# Patient Record
Sex: Female | Born: 1935 | Race: White | Hispanic: No | Marital: Married | Smoking: Never smoker
Health system: Southern US, Community
[De-identification: ages and names within clinical notes are randomized; demographics above are authoritative.]

## PROBLEM LIST (undated history)

## (undated) DIAGNOSIS — I495 Sick sinus syndrome: Secondary | ICD-10-CM

## (undated) DIAGNOSIS — R2681 Unsteadiness on feet: Secondary | ICD-10-CM

## (undated) DIAGNOSIS — G8929 Other chronic pain: Secondary | ICD-10-CM

## (undated) DIAGNOSIS — K5900 Constipation, unspecified: Secondary | ICD-10-CM

## (undated) DIAGNOSIS — I4891 Unspecified atrial fibrillation: Secondary | ICD-10-CM

## (undated) DIAGNOSIS — E785 Hyperlipidemia, unspecified: Secondary | ICD-10-CM

## (undated) DIAGNOSIS — I208 Other forms of angina pectoris: Secondary | ICD-10-CM

## (undated) DIAGNOSIS — G20 Parkinson's disease: Secondary | ICD-10-CM

## (undated) DIAGNOSIS — IMO0001 Reserved for inherently not codable concepts without codable children: Secondary | ICD-10-CM

## (undated) DIAGNOSIS — M549 Dorsalgia, unspecified: Secondary | ICD-10-CM

## (undated) DIAGNOSIS — E039 Hypothyroidism, unspecified: Secondary | ICD-10-CM

## (undated) DIAGNOSIS — G20A1 Parkinson's disease without dyskinesia, without mention of fluctuations: Secondary | ICD-10-CM

## (undated) DIAGNOSIS — R251 Tremor, unspecified: Secondary | ICD-10-CM

## (undated) DIAGNOSIS — Z950 Presence of cardiac pacemaker: Secondary | ICD-10-CM

## (undated) DIAGNOSIS — M1990 Unspecified osteoarthritis, unspecified site: Secondary | ICD-10-CM

## (undated) DIAGNOSIS — I351 Nonrheumatic aortic (valve) insufficiency: Secondary | ICD-10-CM

## (undated) HISTORY — PX: ABDOMINAL HYSTERECTOMY: SHX81

## (undated) HISTORY — DX: Sick sinus syndrome: I49.5

## (undated) HISTORY — PX: OTHER SURGICAL HISTORY: SHX169

## (undated) HISTORY — DX: Constipation, unspecified: K59.00

## (undated) HISTORY — DX: Unspecified atrial fibrillation: I48.91

## (undated) HISTORY — PX: INSERT / REPLACE / REMOVE PACEMAKER: SUR710

---

## 1999-01-25 HISTORY — PX: CARDIAC CATHETERIZATION: SHX172

## 1999-05-21 ENCOUNTER — Encounter: Admission: RE | Admit: 1999-05-21 | Discharge: 1999-05-21 | Payer: Self-pay | Admitting: Cardiology

## 1999-05-21 ENCOUNTER — Encounter: Payer: Self-pay | Admitting: Cardiology

## 1999-05-26 ENCOUNTER — Ambulatory Visit (HOSPITAL_COMMUNITY): Admission: RE | Admit: 1999-05-26 | Discharge: 1999-05-26 | Payer: Self-pay | Admitting: Rehabilitation

## 1999-05-26 HISTORY — PX: CARDIAC CATHETERIZATION: SHX172

## 2001-04-03 ENCOUNTER — Ambulatory Visit (HOSPITAL_COMMUNITY): Admission: RE | Admit: 2001-04-03 | Discharge: 2001-04-03 | Payer: Self-pay | Admitting: Obstetrics and Gynecology

## 2001-04-03 ENCOUNTER — Encounter: Payer: Self-pay | Admitting: Obstetrics and Gynecology

## 2001-05-28 ENCOUNTER — Other Ambulatory Visit: Admission: RE | Admit: 2001-05-28 | Discharge: 2001-05-28 | Payer: Self-pay | Admitting: Dermatology

## 2002-03-28 ENCOUNTER — Inpatient Hospital Stay (HOSPITAL_COMMUNITY): Admission: RE | Admit: 2002-03-28 | Discharge: 2002-03-29 | Payer: Self-pay | Admitting: Obstetrics & Gynecology

## 2002-06-26 ENCOUNTER — Other Ambulatory Visit: Admission: RE | Admit: 2002-06-26 | Discharge: 2002-06-26 | Payer: Self-pay | Admitting: Orthopaedic Surgery

## 2002-08-05 ENCOUNTER — Ambulatory Visit (HOSPITAL_COMMUNITY): Admission: RE | Admit: 2002-08-05 | Discharge: 2002-08-05 | Payer: Self-pay | Admitting: Internal Medicine

## 2004-02-26 ENCOUNTER — Ambulatory Visit: Payer: Self-pay | Admitting: Internal Medicine

## 2004-10-20 ENCOUNTER — Emergency Department (HOSPITAL_COMMUNITY): Admission: EM | Admit: 2004-10-20 | Discharge: 2004-10-20 | Payer: Self-pay | Admitting: Emergency Medicine

## 2006-02-01 ENCOUNTER — Ambulatory Visit: Payer: Self-pay | Admitting: Internal Medicine

## 2006-02-07 ENCOUNTER — Ambulatory Visit (HOSPITAL_COMMUNITY): Admission: RE | Admit: 2006-02-07 | Discharge: 2006-02-07 | Payer: Self-pay | Admitting: Internal Medicine

## 2008-08-10 ENCOUNTER — Emergency Department (HOSPITAL_COMMUNITY): Admission: EM | Admit: 2008-08-10 | Discharge: 2008-08-10 | Payer: Self-pay | Admitting: Emergency Medicine

## 2009-04-21 ENCOUNTER — Ambulatory Visit (HOSPITAL_COMMUNITY): Admission: RE | Admit: 2009-04-21 | Discharge: 2009-04-21 | Payer: Self-pay | Admitting: Family Medicine

## 2010-01-13 ENCOUNTER — Ambulatory Visit: Payer: Self-pay | Admitting: Internal Medicine

## 2010-02-14 ENCOUNTER — Encounter: Payer: Self-pay | Admitting: Obstetrics & Gynecology

## 2010-03-15 ENCOUNTER — Emergency Department (HOSPITAL_COMMUNITY)
Admission: EM | Admit: 2010-03-15 | Discharge: 2010-03-15 | Disposition: A | Discharge status: Home / Self Care | Payer: Medicare Other | Attending: Emergency Medicine | Admitting: Emergency Medicine

## 2010-03-15 DIAGNOSIS — E7800 Pure hypercholesterolemia, unspecified: Secondary | ICD-10-CM | POA: Insufficient documentation

## 2010-03-15 DIAGNOSIS — W2203XA Walked into furniture, initial encounter: Secondary | ICD-10-CM | POA: Insufficient documentation

## 2010-03-15 DIAGNOSIS — S61409A Unspecified open wound of unspecified hand, initial encounter: Secondary | ICD-10-CM | POA: Insufficient documentation

## 2010-03-15 DIAGNOSIS — Y92009 Unspecified place in unspecified non-institutional (private) residence as the place of occurrence of the external cause: Secondary | ICD-10-CM | POA: Insufficient documentation

## 2010-05-25 ENCOUNTER — Other Ambulatory Visit (HOSPITAL_COMMUNITY): Payer: Self-pay | Admitting: Family Medicine

## 2010-05-25 ENCOUNTER — Ambulatory Visit (HOSPITAL_COMMUNITY): Payer: Medicare Other

## 2010-05-25 DIAGNOSIS — G8929 Other chronic pain: Secondary | ICD-10-CM

## 2010-05-25 DIAGNOSIS — Z139 Encounter for screening, unspecified: Secondary | ICD-10-CM

## 2010-05-25 DIAGNOSIS — M545 Low back pain: Secondary | ICD-10-CM

## 2010-05-28 ENCOUNTER — Other Ambulatory Visit (HOSPITAL_COMMUNITY): Payer: Medicare Other

## 2010-05-28 ENCOUNTER — Ambulatory Visit (HOSPITAL_COMMUNITY): Payer: Medicare Other

## 2010-06-08 ENCOUNTER — Encounter (HOSPITAL_COMMUNITY): Payer: Self-pay

## 2010-06-08 ENCOUNTER — Ambulatory Visit (HOSPITAL_COMMUNITY)
Admission: RE | Admit: 2010-06-08 | Discharge: 2010-06-08 | Disposition: A | Discharge status: Home / Self Care | Payer: Medicare Other | Source: Ambulatory Visit | Attending: Family Medicine | Admitting: Family Medicine

## 2010-06-08 DIAGNOSIS — G8929 Other chronic pain: Secondary | ICD-10-CM

## 2010-06-08 DIAGNOSIS — Z1231 Encounter for screening mammogram for malignant neoplasm of breast: Secondary | ICD-10-CM | POA: Insufficient documentation

## 2010-06-08 DIAGNOSIS — Z139 Encounter for screening, unspecified: Secondary | ICD-10-CM

## 2010-06-08 DIAGNOSIS — M818 Other osteoporosis without current pathological fracture: Secondary | ICD-10-CM | POA: Insufficient documentation

## 2010-06-08 DIAGNOSIS — M545 Low back pain: Secondary | ICD-10-CM

## 2010-06-11 NOTE — Discharge Summary (Signed)
   NAME:  Abigail Taylor, Abigail Taylor                         ACCOUNT NO.:  [id number]   MEDICAL RECORD NO.:  [id number]                   PATIENT TYPE:  INP   LOCATION:  A308                                 FACILITY:  [facility]   PHYSICIAN:  Lazaro Arms, M.D.                DATE OF BIRTH:  [birth year]   DATE OF ADMISSION:  03/28/2002  DATE OF DISCHARGE:  03/29/2002                                 DISCHARGE SUMMARY   DISCHARGE DIAGNOSES:  1. Status post a vaginal vault suspension.  2. Status post an anterior colporrhaphy using Pelvicol Acellular Matrix     graft.  3. Enterocele repair.  4. Unremarkable postoperative course.   PROCEDURE:  Stated as above.   HISTORY OF PRESENT ILLNESS:  Please refer to the transcribed history and  physical and operative note for details of admission to the hospital.   HOSPITAL COURSE:  The patient was admitted postoperatively.  She had  problems with postoperative nausea and vomiting which were taken care of  with Zofran, Reglan, and Pepcid.  She does have a history of a small hiatal  hernia.  We took the packing and Foley catheter out on the morning of  postoperative day #1, and the patient did well.  She then tolerated clear  liquids and a regular diet.  She voided twice on postoperative day #1 prior  to discharge; once she had 375 mL of clear urine with 200 mL of residual,  and then she had 275 mL of clear urine and 200 mL of residual.  She had  minimal vaginal spotting.  She was in no pain, taking no pain medicine.  She  was discharged to home to come back to the office in three days to check a  postvoid residual.  She is discharged to home on Levaquin 500 a day, and she  is going to take Tylenol or Advil at home as needed for pain.  We will see  her in the office on Monday.                                               Lazaro Arms, M.D.    Loraine Maple  D:  03/29/2002  T:  03/30/2002  Job:  [id number]

## 2010-06-11 NOTE — Op Note (Signed)
NAME:  Abigail Taylor, Abigail Taylor                         ACCOUNT NO.:  [id number]   MEDICAL RECORD NO.:  [id number]                   PATIENT TYPE:  AMB   LOCATION:  DAY                                  FACILITY:  [facility]   PHYSICIAN:  Lazaro Arms, M.D.                DATE OF BIRTH:  [birth year]   DATE OF PROCEDURE:  03/28/2002  DATE OF DISCHARGE:                                 OPERATIVE REPORT   PREOPERATIVE DIAGNOSES:  1. Grade 3 cystocele with increasing pelvic pressure and pain.  2. Mild or grade 1 rectocele with mild constipation.   POSTOPERATIVE DIAGNOSES:  1. Grade 3 cystocele.  2. Enterocele.  3. Mild grade 1 rectocele.   PROCEDURE:  1. Interspinous vaginal vault suspension using the Bard Pelvicol Acellular     Collagen Matrix graft.  2. Anterior colporrhaphy using the Bard Pelvicol Acellular Collagen Matrix     graft.  3. Enterocele repair.   SURGEON:  Lazaro Arms, M.D.   ANESTHESIA:  General endotracheal.   FINDINGS:  The patient had essentially the same findings as in the office  but had found an enterocele at the time of surgery. Her rectocele was very  minor after the other repairs were done and because the patient's very  strong desired to have no disruption in sexual function at all. A posterior  colporrhaphy was not performed a this time.   DESCRIPTION OF PROCEDURE:  The patient was taken to the operating room,  placed in the supine position where she underwent general endotracheal  anesthesia. She was then placed in dorsal lithotomy position, prepped and  draped in the usual sterile fashion. A Foley catheter was placed for  continuous urine drainage. The vaginal apex was identified and grasped. A  0.5% Marcaine with 1:200,000 epinephrine was injected in the midline. A  sharp vertical incision was then made and the bladder was dissected off the  vaginal wall without difficulty. Hemostasis was achieved using the  electrocautery unit as well as interrupted  sutures for larger venous  bleeders. I was very pleased with the hemostasis we had achieved during the  case. During the dissection of the bladder off the vaginal wall, I  discovered an enterocele. The peritoneum was incised. The enterocele was  dissected back and a pursestring suture of anterior and posterior peritoneum  was performed as high as I could go and there was closure of the enterocele  sac. Dissection was then taken up to the symphysis pubis and taken back  posteriorly and laterally to the ischial spines. Then I got out the 8 x 12  Bard Pelvicol Acellular Matrix graft and barked it and marked it and cut it  according to previous models. Then I did a pleating stitch of the inferior  wing in order to take up some of the slack. I then passed an #0 Vicryl and a  nub through the inferior wing  and lateral wing of the graft and used a capio  self retaining needle driver and passed the suture through the fascia that  overlies the ischial spines bilaterally. These were then tied down  bilaterally with excellent strength and anatomical relationship. The  pubocervical fascia was then attached to the Pelvicol graft more anteriorly  and medially. Also attached in the midline underneath the bladder neck being  careful not to interrupt the urethra at all. I then performed an  interspinous vaginal vault suspension by going through the vagina and the  graft at two points and tying those down. I then trimmed back the excess  vagina and made sure all pedicles were hemostatic. The Pelvicol graft was  well secured in all six points and the vaginal apex was well secured. The  excess vaginal tissue was trimmed and then closed with interrupted sutures  and again attached at the bottom of the graft to ensure vaginal fixation. At  this point, there was a very mild amount to do posteriorly and the patient  has a very deep desire to maintain sexual function and I felt this could  compromise that and at  this time I did not do the posterior repair. That may  be something that will need to be done in the future but for now I did not  feel like it was appropriate.   The patient was awakened from anesthesia, all counts were correct x3. She  was taken to the recovery room in good stable condition. There a 100 mL  blood loss, she received 1900 mL of crystalloid and all tissue was sent to  pathology for evaluation.                                               Lazaro Arms, M.D.    Loraine Maple  D:  03/28/2002  T:  03/28/2002  Job:  [id number]

## 2010-06-11 NOTE — Cardiovascular Report (Signed)
[facility]  Patient:    Abigail Taylor, Abigail Taylor                        MRN: [id number] Proc. Date: 05/26/99 Adm. Date:  [id number] Disc. Date: [id number] Attending:  Ophelia Shoulder CC:         Madaline Savage, M.D.             Patrica Duel, M.D.             Cardiac Catheterization Laboratory                        Cardiac Catheterization  PROCEDURES PERFORMED: 1. Selective coronary angiography by Judkins technique. 2. Retrograde left heart catheterization. 3. Left ventricular angiography.  COMPLICATIONS:  None.  ENTRY SITE:  Right femoral.  DYE USED:  Omnipaque.  MEDICATIONS GIVEN:  None.  PATIENT PROFILE:  The patient is a 77 year old woman who enjoys yard work and has had recent chest discomfort while doing yard work that is relived by rest. A Persantine Cardiolite stress test performed May 10, 1999, showed suggestion of ischemia in the circumflex territory and a left ventricular ejection fraction of 80%.  Today she enters the cardiac catheterization lab on beta blockers and aspirin for a diagnostic outpatient cardiac catheterization.  RESULTS:  PRESSURES:  The left ventricular pressure was 125/26.  The central aortic pressure was 125/65, mean of 90.  No aortic valve gradient by pullback technique.  ANGIOGRAPHIC RESULTS:  The left main coronary artery was short and widely patent.  The left anterior descending artery coronary artery coursed to the cardiac apex and gave rise to two relatively small diagonal branches.  The LAD became tortuous in the distal half of the vessel.  No lesions were seen in the LAD or its diagonals.  The left circumflex coronary artery is a smooth normal-appearing vessel with three obtuse marginal branches, all of which are tortuous, all of which contain no lesions.  The circumflex itself appears normal as well including the ostium of the vessel.  The right coronary artery is a dominant vessel which  initially was prone to catheter-induced spasm by a 6 French Judkins catheter.  I did flush injections of the ostium, which continued to show some mild spasm so I then changed catheters to a 6 French side-hole guide catheter and infused nitroglycerin 200 mcg intracoronary on two different occasions and after doing so the entire vessel appeared larger as well as the segment proximally that showed the spasm.  I felt very comfortable that the spasm had completely resolved and that the vessel was indeed normal in multiple views including a lateral projection.  The left ventricular angiogram showed normal contractility with normal wall motion and an ejection fraction of 75% estimated.  No evidence of prolapse or mitral regurgitation.  There was no apical thrombus.  FINAL DIAGNOSES: 1. Angiographically patent coronary arteries of the right coronary dominant    system. 2. Normal left ventricular systolic function.  PLAN:  The patient is reassured with regard to her heart and coronary arteries and we will speak to the patients primary care physician in terms of a continued work-up of her chest discomfort. DD:  05/26/99 TD:  05/27/99 Job: [id number]

## 2010-06-11 NOTE — H&P (Signed)
NAME:  Abigail Taylor, Abigail Taylor                         ACCOUNT NO.:  [id number]   MEDICAL RECORD NO.:  [id number]                   PATIENT TYPE:  AMB   LOCATION:  DAY                                  FACILITY:  [facility]   PHYSICIAN:  Lazaro Arms, M.D.                DATE OF BIRTH:  February 19, 1931   DATE OF ADMISSION:  03/28/2002  DATE OF DISCHARGE:                                HISTORY & PHYSICAL   HISTORY OF PRESENT ILLNESS:  The patient is a 77 year old Abigail Taylor female,  gravida 3, para 3, status post a TAH in 1972, who has been struggling now  for a couple of years with increased pelvic pressure.  She is known to have  a pelvic prolapse with a cystocele.  She really does not have much complaint  of posterior pressure, but she is chronically constipated.  She is not  incontinent of urine.  On examination, she has a grade 2 to 3 cystocele and  a mild-to-moderate rectocele.  No enterocele was appreciated and again,  without the history of incontinence at all, she really does not need to have  a sling procedure performed.  She is admitted for an A&P repair with a  Pelvicol graft and a vaginal vault fixation.   PAST MEDICAL HISTORY:  Hypercholesterolemia and osteoarthritis.   PAST SURGICAL HISTORY:  She had a hysterectomy in 1972 abdominally and she  has had a heart catheterization about a year ago which was normal.  She had  chest pain which turned out to be gastroesophageal reflux disease.  She has  also had two plastic surgery procedures on her face.   PAST OBSTETRICAL HISTORY:  Vaginal deliveries.   MEDICINES:  Lipitor 20 mg a day.   REVIEW OF SYSTEMS:  Review of systems is significant for gastroesophageal  reflux occasionally.   SOCIAL HISTORY:  She is a retired lady and does not smoke.   PHYSICAL EXAMINATION:  VITAL SIGNS:  Her blood pressure is 120/80.  HEENT:  Unremarkable.  NECK:  Thyroid is normal.  LUNGS:  Lungs are clear.  HEART:  Heart is regular rate and rhythm without  murmur, regurgitation or  gallop.  BREASTS:  Breasts without mass, discharge or skin changes.  ABDOMEN:  Abdomen is benign.  No hepatosplenomegaly.  PELVIC:  She has normal external genitalia.  Vagina is normal without  discharge.  She does have a grade 2 to 3 cystocele and a mild rectocele.  No  enterocele was appreciated.  EXTREMITIES:  Her extremities are warm with no edema.  NEUROLOGIC:  Exam is grossly intact.   IMPRESSION:  1. Symptomatic pelvic relaxation without incontinence.  2. Constipation.   PLAN:  The patient is admitted for an A&P colporrhaphy with Pelvicol graft  being used as a matrix and a vaginal vault fixation.  She understands the  risks, benefits, indications and alternatives and will proceed.  Lazaro Arms, M.D.    Loraine Maple  D:  03/27/2002  T:  03/28/2002  Job:  [id number]

## 2010-06-11 NOTE — Op Note (Signed)
NAME:  Abigail Taylor, Abigail Taylor                         ACCOUNT NO.:  [id number]   MEDICAL RECORD NO.:  [id number]                   PATIENT TYPE:  AMB   LOCATION:  DAY                                  FACILITY:  [facility]   PHYSICIAN:  Lionel December, M.D.                 DATE OF BIRTH:  09-27-1931   DATE OF PROCEDURE:  08/05/2002  DATE OF DISCHARGE:                                 OPERATIVE REPORT   PROCEDURES PERFORMED:  Esophagogastroduodenoscopy followed by colonoscopy.   INDICATIONS FOR PROCEDURE:  Mikia is a 77 year old Caucasian female with  atypical chest pain radiating to back, face and mouth.  She has had normal  cardiac catheterization previously.  Upper GI series showed a small sliding  hiatal hernia.  She is on over-the-counter meds on a p.r.n. basis.  She is  undergoing diagnostic EGD.  She will also undergo a colonoscopy for  screening purposes.  Last colonoscopy was in March 1999.  Family history is  significant for colon carcinoma in a mother at age 25 and she is now 65  years old.  The procedure was reviewed with the patient and informed consent  was obtained.   PREOP MEDICATIONS:  Cetacaine spray for pharyngeal topical anesthesia,  Demerol 25 mg IV, Versed 4 mg IV.   DESCRIPTION OF PROCEDURE:  The procedure was performed in the endoscopy  suite.  The patient's vital signs and oxygen saturations were monitored  through the procedure and remained stable.   Procedure #1 - Esophagogastroduodenoscopy.  The patient was placed in the  left lateral position and the Olympus video scope was passed per oropharynx  without difficulty into the esophagus.   Esophagus:  The mucosa of the esophagus was normal except she had erosion at  GE junction and a swollen gastric mucosa at the Z-line.  A small sliding  hiatal hernia.   Stomach:  It was empty and distended very well with insufflation.  Folds of  the proximal stomach were normal.  Examination of mucosa at gastric body,  antrum,  pyloric channel as well as angularis, fundus and cardia was normal.   Duodenum:  Examination of the bulb and postbulbar duodenum was normal.   The endoscope was withdrawn and the patient prepared for procedure #2.   Procedure #2 - Colonoscopy.  Rectal examination performed.  She had a soft  sentinel skin tag.  Digital exam was normal.  The scope was placed in the  rectum and advanced in the region into the sigmoid colon beyond.  Preparation was excellent.  Scattered diverticula were noted at sigmoid and  descending colon, some of which were quite large.  Scope was passed to the  cecum which was identified by ileocecal valve and appendiceal stumps.  Pictures taken for the record.  As the scope was withdrawn, colonic was once  again carefully examined.  There were no polyps or tumor masses.  Perirectal  mucosa  was normal.  The scope was retroflexed to examine anorectal junction  and hemorrhoids were noted below the dentate line.  The endoscope was  straightened and withdrawn.  The patient tolerated the procedure well.   FINAL DIAGNOSES:  1. Erosive reflux esophagitis with a small hiatal hernia.  Erosions limited     to gastroesophageal junction.  2. Normal exam of the stomach plus second part of the duodenum.  3. Left colonic diverticulosis and internal hemorrhoids, otherwise normal     colonoscopy.   RECOMMENDATIONS:  She will continue antireflux measures.  I would like for  her to take Prilosec 20 mg every morning for at least a month and see if she  gets a better relief in which case she can stay on chronically.  High fiber  diet.  Citrucel equivalent of 1 tablespoonful daily.  She should consider  having another colonoscopy in five years from now.                                               Lionel December, M.D.    NR/MEDQ  D:  08/05/2002  T:  08/05/2002  Job:  [id number]   cc:   Patrica Duel, M.D.  [address], [address]  [place]  [place] [address]  Fax: [phone]

## 2011-01-25 DIAGNOSIS — IMO0001 Reserved for inherently not codable concepts without codable children: Secondary | ICD-10-CM

## 2011-01-25 HISTORY — DX: Reserved for inherently not codable concepts without codable children: IMO0001

## 2011-02-24 DIAGNOSIS — H52229 Regular astigmatism, unspecified eye: Secondary | ICD-10-CM | POA: Diagnosis not present

## 2011-02-24 DIAGNOSIS — H5200 Hypermetropia, unspecified eye: Secondary | ICD-10-CM | POA: Diagnosis not present

## 2011-02-24 DIAGNOSIS — Z961 Presence of intraocular lens: Secondary | ICD-10-CM | POA: Diagnosis not present

## 2011-02-24 DIAGNOSIS — H04129 Dry eye syndrome of unspecified lacrimal gland: Secondary | ICD-10-CM | POA: Diagnosis not present

## 2011-03-03 DIAGNOSIS — L578 Other skin changes due to chronic exposure to nonionizing radiation: Secondary | ICD-10-CM | POA: Diagnosis not present

## 2011-03-03 DIAGNOSIS — D235 Other benign neoplasm of skin of trunk: Secondary | ICD-10-CM | POA: Diagnosis not present

## 2011-03-29 DIAGNOSIS — IMO0002 Reserved for concepts with insufficient information to code with codable children: Secondary | ICD-10-CM | POA: Diagnosis not present

## 2011-03-29 DIAGNOSIS — N390 Urinary tract infection, site not specified: Secondary | ICD-10-CM | POA: Diagnosis not present

## 2011-03-29 DIAGNOSIS — M549 Dorsalgia, unspecified: Secondary | ICD-10-CM | POA: Diagnosis not present

## 2011-04-25 DIAGNOSIS — J9801 Acute bronchospasm: Secondary | ICD-10-CM | POA: Diagnosis not present

## 2011-04-25 DIAGNOSIS — J209 Acute bronchitis, unspecified: Secondary | ICD-10-CM | POA: Diagnosis not present

## 2011-04-25 DIAGNOSIS — IMO0002 Reserved for concepts with insufficient information to code with codable children: Secondary | ICD-10-CM | POA: Diagnosis not present

## 2011-04-25 DIAGNOSIS — K219 Gastro-esophageal reflux disease without esophagitis: Secondary | ICD-10-CM | POA: Diagnosis not present

## 2011-04-25 DIAGNOSIS — E785 Hyperlipidemia, unspecified: Secondary | ICD-10-CM | POA: Diagnosis not present

## 2011-05-06 ENCOUNTER — Other Ambulatory Visit (HOSPITAL_COMMUNITY): Payer: Self-pay | Admitting: Physician Assistant

## 2011-05-06 ENCOUNTER — Ambulatory Visit (HOSPITAL_COMMUNITY)
Admission: RE | Admit: 2011-05-06 | Discharge: 2011-05-06 | Disposition: A | Discharge status: Home / Self Care | Payer: Medicare Other | Source: Ambulatory Visit | Attending: Physician Assistant | Admitting: Physician Assistant

## 2011-05-06 DIAGNOSIS — R059 Cough, unspecified: Secondary | ICD-10-CM | POA: Diagnosis not present

## 2011-05-06 DIAGNOSIS — R05 Cough: Secondary | ICD-10-CM

## 2011-05-06 DIAGNOSIS — J984 Other disorders of lung: Secondary | ICD-10-CM | POA: Diagnosis not present

## 2011-05-06 DIAGNOSIS — IMO0002 Reserved for concepts with insufficient information to code with codable children: Secondary | ICD-10-CM | POA: Diagnosis not present

## 2011-07-04 ENCOUNTER — Encounter (HOSPITAL_COMMUNITY): Payer: Self-pay | Admitting: *Deleted

## 2011-07-04 ENCOUNTER — Inpatient Hospital Stay (HOSPITAL_COMMUNITY)
Admission: EM | Admit: 2011-07-04 | Discharge: 2011-07-06 | DRG: 244 | Disposition: A | Discharge status: Home / Self Care | Payer: Medicare Other | Attending: Cardiovascular Disease | Admitting: Cardiovascular Disease

## 2011-07-04 ENCOUNTER — Other Ambulatory Visit: Payer: Self-pay

## 2011-07-04 ENCOUNTER — Emergency Department (HOSPITAL_COMMUNITY): Payer: Medicare Other

## 2011-07-04 DIAGNOSIS — I4891 Unspecified atrial fibrillation: Principal | ICD-10-CM | POA: Diagnosis present

## 2011-07-04 DIAGNOSIS — I455 Other specified heart block: Secondary | ICD-10-CM | POA: Diagnosis not present

## 2011-07-04 DIAGNOSIS — J9819 Other pulmonary collapse: Secondary | ICD-10-CM | POA: Diagnosis not present

## 2011-07-04 DIAGNOSIS — I495 Sick sinus syndrome: Secondary | ICD-10-CM | POA: Diagnosis present

## 2011-07-04 DIAGNOSIS — I209 Angina pectoris, unspecified: Secondary | ICD-10-CM | POA: Diagnosis present

## 2011-07-04 DIAGNOSIS — E039 Hypothyroidism, unspecified: Secondary | ICD-10-CM | POA: Diagnosis present

## 2011-07-04 DIAGNOSIS — I10 Essential (primary) hypertension: Secondary | ICD-10-CM | POA: Diagnosis present

## 2011-07-04 DIAGNOSIS — R079 Chest pain, unspecified: Secondary | ICD-10-CM | POA: Diagnosis not present

## 2011-07-04 DIAGNOSIS — E669 Obesity, unspecified: Secondary | ICD-10-CM | POA: Diagnosis present

## 2011-07-04 DIAGNOSIS — E785 Hyperlipidemia, unspecified: Secondary | ICD-10-CM | POA: Diagnosis present

## 2011-07-04 DIAGNOSIS — I4901 Ventricular fibrillation: Secondary | ICD-10-CM | POA: Diagnosis not present

## 2011-07-04 DIAGNOSIS — E7800 Pure hypercholesterolemia, unspecified: Secondary | ICD-10-CM | POA: Diagnosis present

## 2011-07-04 DIAGNOSIS — I208 Other forms of angina pectoris: Secondary | ICD-10-CM | POA: Diagnosis present

## 2011-07-04 DIAGNOSIS — I480 Paroxysmal atrial fibrillation: Secondary | ICD-10-CM | POA: Diagnosis present

## 2011-07-04 HISTORY — DX: Hyperlipidemia, unspecified: E78.5

## 2011-07-04 HISTORY — DX: Other forms of angina pectoris: I20.8

## 2011-07-04 HISTORY — DX: Hypothyroidism, unspecified: E03.9

## 2011-07-04 HISTORY — DX: Nonrheumatic aortic (valve) insufficiency: I35.1

## 2011-07-04 LAB — CBC
HCT: 41.9 % (ref 36.0–46.0)
Hemoglobin: 14.3 g/dL (ref 12.0–15.0)
MCH: 29.1 pg (ref 26.0–34.0)
MCHC: 34.1 g/dL (ref 30.0–36.0)

## 2011-07-04 LAB — POCT I-STAT, CHEM 8
BUN: 23 mg/dL (ref 6–23)
Calcium, Ion: 1.22 mmol/L (ref 1.12–1.32)
Chloride: 108 mEq/L (ref 96–112)
Glucose, Bld: 161 mg/dL — ABNORMAL HIGH (ref 70–99)

## 2011-07-04 LAB — BASIC METABOLIC PANEL
BUN: 23 mg/dL (ref 6–23)
Calcium: 9.6 mg/dL (ref 8.4–10.5)
Creatinine, Ser: 0.92 mg/dL (ref 0.50–1.10)
GFR calc Af Amer: 67 mL/min — ABNORMAL LOW (ref >90)
GFR calc non Af Amer: 58 mL/min — ABNORMAL LOW (ref >90)

## 2011-07-04 LAB — POCT I-STAT TROPONIN I: Troponin i, poc: 0 ng/mL (ref 0.00–0.08)

## 2011-07-04 LAB — DIFFERENTIAL
Basophils Relative: 1 % (ref 0–1)
Eosinophils Absolute: 0.2 10*3/uL (ref 0.0–0.7)
Eosinophils Relative: 2 % (ref 0–5)
Monocytes Absolute: 0.8 10*3/uL (ref 0.1–1.0)
Monocytes Relative: 9 % (ref 3–12)

## 2011-07-04 LAB — APTT: aPTT: 26 seconds (ref 24–37)

## 2011-07-04 MED ORDER — DEXTROSE 5 % IV SOLN
5.0000 mg/h | Freq: Once | INTRAVENOUS | Status: AC
  Administered 2011-07-04: 10 mg/h via INTRAVENOUS
  Administered 2011-07-04: 20 mg/h via INTRAVENOUS

## 2011-07-04 MED ORDER — ASPIRIN 81 MG PO CHEW
324.0000 mg | CHEWABLE_TABLET | Freq: Once | ORAL | Status: AC
  Administered 2011-07-04: 324 mg via ORAL
  Filled 2011-07-04: qty 4

## 2011-07-04 NOTE — ED Notes (Signed)
Dr Preston Fleeting informed of bp cardizem stopped and fluid bolus started. Pt states cp resolved.

## 2011-07-04 NOTE — ED Provider Notes (Signed)
History     CSN: [id number]  Arrival date & time 07/04/11  2059   First MD Initiated Contact with Patient 07/04/11 2121      Chief Complaint  Patient presents with  . Chest Pain    (Consider location/radiation/quality/duration/timing/severity/associated sxs/prior treatment) Patient is a 78 y.o. female presenting with chest pain. The history is provided by the patient.  Chest Pain   She was chasing a neighbor dog with a broom and walking briskly when she developed a heavy, tight feeling in her lower chest. Pain was moderate and she rates it at 5/10. This is associated with dyspnea and palpitations. This occurred at about 1800 hours. There was no nausea, vomiting, diaphoresis. She's never had any symptoms like this before. She does see a cardiologist with [facility] but states that she had a negative heart catheterization does not have any heart disease that she knows of. She describes her symptoms as moderate to severe. Nothing makes it better nothing makes it worse.  History reviewed. No pertinent past medical history.  History reviewed. No pertinent past surgical history.  No family history on file.  History  Substance Use Topics  . Smoking status: Never Smoker   . Smokeless tobacco: Not on file  . Alcohol Use: No    OB History    Grav Para Term Preterm Abortions TAB SAB Ect Mult Living                  Review of Systems  Cardiovascular: Positive for chest pain.  All other systems reviewed and are negative.    Allergies  Review of patient's allergies indicates no known allergies.  Home Medications  No current outpatient prescriptions on file.  BP 119/78  Pulse 157  Temp 97.4 F (36.3 C)  Resp 20  SpO2 98%  Physical Exam  Nursing note and vitals reviewed.  78 year old female who is resting comfortably and in no acute distress. Vital signs are significant for tachycardia with heart rate 157. Oxygen saturation is 96% which is normal.  Head is normocephalic and atraumatic. PERRLA, EOMI. Oropharynx is clear. Neck is nontender and supple without adenopathy or JVD. Back is nontender. Lungs are clear without rales, wheezes, rhonchi. Heart is tachycardic and irregular without murmur. Abdomen is soft, flat, nontender without masses or hepatosplenomegaly. Extremities have full range of motion, no cyanosis or edema. Skin is warm and dry without rash. Neurologic: Mental status is normal, cranial nerves are intact, there no focal motor or sensory deficits.  ED Course  Procedures (including critical care time)  Results for orders placed during the hospital encounter of 07/04/11  CBC      Component Value Range   WBC 8.9  4.0 - 10.5 (K/uL)   RBC 4.91  3.87 - 5.11 (MIL/uL)   Hemoglobin 14.3  12.0 - 15.0 (g/dL)   HCT 40.9  81.1 - 91.4 (%)   MCV 85.3  78.0 - 100.0 (fL)   MCH 29.1  26.0 - 34.0 (pg)   MCHC 34.1  30.0 - 36.0 (g/dL)   RDW 78.2  95.6 - 21.3 (%)   Platelets 213  150 - 400 (K/uL)  DIFFERENTIAL      Component Value Range   Neutrophils Relative 67  43 - 77 (%)   Neutro Abs 5.9  1.7 - 7.7 (K/uL)   Lymphocytes Relative 22  12 - 46 (%)   Lymphs Abs 1.9  0.7 - 4.0 (K/uL)   Monocytes Relative 9  3 - 12 (%)  Monocytes Absolute 0.8  0.1 - 1.0 (K/uL)   Eosinophils Relative 2  0 - 5 (%)   Eosinophils Absolute 0.2  0.0 - 0.7 (K/uL)   Basophils Relative 1  0 - 1 (%)   Basophils Absolute 0.0  0.0 - 0.1 (K/uL)  BASIC METABOLIC PANEL      Component Value Range   Sodium 140  135 - 145 (mEq/L)   Potassium 3.9  3.5 - 5.1 (mEq/L)   Chloride 104  96 - 112 (mEq/L)   CO2 22  19 - 32 (mEq/L)   Glucose, Bld 163 (*) 70 - 99 (mg/dL)   BUN 23  6 - 23 (mg/dL)   Creatinine, Ser 9.56  0.50 - 1.10 (mg/dL)   Calcium 9.6  8.4 - 21.3 (mg/dL)   GFR calc non Af Amer 58 (*) >90 (mL/min)   GFR calc Af Amer 67 (*) >90 (mL/min)  TROPONIN I      Component Value Range   Troponin I <0.30  <0.30 (ng/mL)  PROTIME-INR      Component Value Range    Prothrombin Time 12.8  11.6 - 15.2 (seconds)   INR 0.94  0.00 - 1.49   APTT      Component Value Range   aPTT 26  24 - 37 (seconds)  POCT I-STAT, CHEM 8      Component Value Range   Sodium 143  135 - 145 (mEq/L)   Potassium 3.9  3.5 - 5.1 (mEq/L)   Chloride 108  96 - 112 (mEq/L)   BUN 23  6 - 23 (mg/dL)   Creatinine, Ser 0.86  0.50 - 1.10 (mg/dL)   Glucose, Bld 578 (*) 70 - 99 (mg/dL)   Calcium, Ion 4.69  6.29 - 1.32 (mmol/L)   TCO2 22  0 - 100 (mmol/L)   Hemoglobin 14.6  12.0 - 15.0 (g/dL)   HCT 52.8  41.3 - 24.4 (%)  POCT I-STAT TROPONIN I      Component Value Range   Troponin i, poc 0.00  0.00 - 0.08 (ng/mL)   Comment 3            Dg Chest Portable 1 View  07/04/2011  *RADIOLOGY REPORT*  Clinical Data: Chest pain.  PORTABLE CHEST - 1 VIEW  Comparison: 05/06/2011  Findings: Midline trachea.  Normal heart size and mediastinal contours for age.  No definite pleural fluid.  Biapical pleural thickening.  Bibasilar mild subsegmental atelectasis.  Lungs otherwise clear.  IMPRESSION: No acute cardiopulmonary disease.  Original Report Authenticated By: Consuello Bossier, M.D.    Date: 07/05/2011 2104  Rate: 147  Rhythm: atrial fibrillation  QRS Axis: normal  Intervals: normal  ST/T Wave abnormalities: nonspecific ST/T changes  Conduction Disutrbances:right bundle branch block  Narrative Interpretation: Atrial fibrillation with rapid ventricular response, right bundle branch block, nonspecific ST/T changes which are probably rate related. No old ECG available for comparison.  Old EKG Reviewed: none available   Date: 07/05/2011 2315  Rate: 70  Rhythm: normal sinus rhythm  QRS Axis: normal  Intervals: normal  ST/T Wave abnormalities: normal  Conduction Disutrbances:right bundle branch block  Narrative Interpretation: Right bundle branch block. When compared with ECG from earlier today, sinus rhythm has replaced atrial fibrillation with rapid ventricular response, nonspecific ST/T  changes have resolved indicating that they were rate related.  Old EKG Reviewed: changes noted     1. Atrial fibrillation with rapid ventricular response   2. Sinus arrest    CRITICAL CARE Performed by:  Mckenna Boruff   Total critical care time: 40 minutes  Critical care time was exclusive of separately billable procedures and treating other patients.  Critical care was necessary to treat or prevent imminent or life-threatening deterioration.  Critical care was time spent personally by me on the following activities: development of treatment plan with patient and/or surrogate as well as nursing, discussions with consultants, evaluation of patient's response to treatment, examination of patient, obtaining history from patient or surrogate, ordering and performing treatments and interventions, ordering and review of laboratory studies, ordering and review of radiographic studies, pulse oximetry and re-evaluation of patient's condition.    MDM  New onset atrial fibrillation with rapid ventricular response. I have reviewed her records and do not find a report of a cardiac catheterization. I do not feel comfortable enough that she does not have cardiac disease to try single dose of oral flecainide. She will be given diltiazem intravenously for rate control and cardiac markers will be checked.  After diltiazem bolus, heart rate came down to 90 but she became hypotensive with blood pressure of 80. She was given an IV fluid bolus and diltiazem was stopped. She was observed in the emergency department and had a 7.5 second period of sinus arrest and following that, she converted to normal sinus rhythm. She remained in normal sinus rhythm following that. Case was discussed with Dr. Tresa Endo who is on call for [facility] to felt that given her long sinus pause, she should be observed in the hospital overnight on telemetry. Case was discussed with Dr. Conley Rolls of [facility] who agrees  to admit the patient.      Dione Booze, MD 07/05/11 0100

## 2011-07-04 NOTE — ED Notes (Signed)
Pt states walking the dog and began to have chest pain. HR 140-160 monitor intact Dr Preston Fleeting at bedside

## 2011-07-04 NOTE — ED Notes (Signed)
Pt c/o CP and sob. "felt like heart was out of rhythm". Denies radiation or other sx. "Sudden onset about 1 hr ago after chasing a dog out of her front yard with a broom, but she did not run". Reports no PMHx, "but has a card MD that replaced Dr. Elsie Lincoln (does not remember name)". Pt alert, NAD, calm, interactive, skin W&D, resps e/u, speaking in clear complete sentences.

## 2011-07-04 NOTE — ED Notes (Signed)
Pt with a run of asystole pt states she felt like she was going to faint but was able to continue to talk on the phone. Repeat EKG shows pt has converted to sinus. Dr. Preston Fleeting  Informed.

## 2011-07-05 ENCOUNTER — Encounter (HOSPITAL_COMMUNITY): Payer: Self-pay | Admitting: *Deleted

## 2011-07-05 ENCOUNTER — Encounter (HOSPITAL_COMMUNITY)
Admission: EM | Disposition: A | Discharge status: Home / Self Care | Payer: Self-pay | Source: Home / Self Care | Attending: Cardiovascular Disease

## 2011-07-05 DIAGNOSIS — I4891 Unspecified atrial fibrillation: Secondary | ICD-10-CM | POA: Diagnosis not present

## 2011-07-05 DIAGNOSIS — E7800 Pure hypercholesterolemia, unspecified: Secondary | ICD-10-CM | POA: Diagnosis present

## 2011-07-05 DIAGNOSIS — I351 Nonrheumatic aortic (valve) insufficiency: Secondary | ICD-10-CM

## 2011-07-05 DIAGNOSIS — E039 Hypothyroidism, unspecified: Secondary | ICD-10-CM | POA: Diagnosis present

## 2011-07-05 DIAGNOSIS — I455 Other specified heart block: Secondary | ICD-10-CM

## 2011-07-05 DIAGNOSIS — I4901 Ventricular fibrillation: Secondary | ICD-10-CM | POA: Diagnosis not present

## 2011-07-05 DIAGNOSIS — R918 Other nonspecific abnormal finding of lung field: Secondary | ICD-10-CM | POA: Diagnosis not present

## 2011-07-05 DIAGNOSIS — J984 Other disorders of lung: Secondary | ICD-10-CM | POA: Diagnosis not present

## 2011-07-05 DIAGNOSIS — I10 Essential (primary) hypertension: Secondary | ICD-10-CM | POA: Diagnosis present

## 2011-07-05 DIAGNOSIS — I2089 Other forms of angina pectoris: Secondary | ICD-10-CM | POA: Diagnosis present

## 2011-07-05 DIAGNOSIS — E785 Hyperlipidemia, unspecified: Secondary | ICD-10-CM | POA: Diagnosis present

## 2011-07-05 DIAGNOSIS — I208 Other forms of angina pectoris: Secondary | ICD-10-CM

## 2011-07-05 DIAGNOSIS — I480 Paroxysmal atrial fibrillation: Secondary | ICD-10-CM | POA: Diagnosis present

## 2011-07-05 DIAGNOSIS — E669 Obesity, unspecified: Secondary | ICD-10-CM | POA: Diagnosis not present

## 2011-07-05 DIAGNOSIS — R079 Chest pain, unspecified: Secondary | ICD-10-CM | POA: Diagnosis not present

## 2011-07-05 DIAGNOSIS — I495 Sick sinus syndrome: Secondary | ICD-10-CM | POA: Diagnosis not present

## 2011-07-05 DIAGNOSIS — J9819 Other pulmonary collapse: Secondary | ICD-10-CM | POA: Diagnosis not present

## 2011-07-05 DIAGNOSIS — I209 Angina pectoris, unspecified: Secondary | ICD-10-CM | POA: Diagnosis not present

## 2011-07-05 HISTORY — PX: PERMANENT PACEMAKER INSERTION: SHX6023

## 2011-07-05 HISTORY — DX: Other forms of angina pectoris: I20.8

## 2011-07-05 HISTORY — DX: Nonrheumatic aortic (valve) insufficiency: I35.1

## 2011-07-05 HISTORY — PX: PERMANENT PACEMAKER INSERTION: SHX5480

## 2011-07-05 HISTORY — DX: Other forms of angina pectoris: I20.89

## 2011-07-05 LAB — CBC
Platelets: 209 10*3/uL (ref 150–400)
Platelets: 189 10*3/uL (ref 150–400)
RDW: 13.3 % (ref 11.5–15.5)
RDW: 13.2 % (ref 11.5–15.5)
WBC: 8.4 10*3/uL (ref 4.0–10.5)
WBC: 7.8 10*3/uL (ref 4.0–10.5)

## 2011-07-05 LAB — CREATININE, SERUM
Creatinine, Ser: 0.85 mg/dL (ref 0.50–1.10)
GFR calc Af Amer: 74 mL/min — ABNORMAL LOW (ref >90)
GFR calc non Af Amer: 64 mL/min — ABNORMAL LOW (ref >90)

## 2011-07-05 LAB — BASIC METABOLIC PANEL
Calcium: 8.8 mg/dL (ref 8.4–10.5)
Creatinine, Ser: 0.79 mg/dL (ref 0.50–1.10)
GFR calc Af Amer: >90 mL/min (ref >90)

## 2011-07-05 LAB — CARDIAC PANEL(CRET KIN+CKTOT+MB+TROPI)
CK, MB: 3.5 ng/mL (ref 0.3–4.0)
CK, MB: 4.1 ng/mL — ABNORMAL HIGH (ref 0.3–4.0)
Relative Index: INVALID (ref 0.0–2.5)
Relative Index: INVALID (ref 0.0–2.5)
Total CK: 75 U/L (ref 7–177)

## 2011-07-05 SURGERY — PERMANENT PACEMAKER INSERTION
Anesthesia: LOCAL

## 2011-07-05 MED ORDER — SODIUM CHLORIDE 0.9 % IR SOLN
80.0000 mg | Status: DC
  Filled 2011-07-05: qty 2

## 2011-07-05 MED ORDER — SODIUM CHLORIDE 0.9 % IJ SOLN: 3.0000 mL | INTRAMUSCULAR | Status: DC | PRN

## 2011-07-05 MED ORDER — PANTOPRAZOLE SODIUM 40 MG PO TBEC
40.0000 mg | DELAYED_RELEASE_TABLET | Freq: Every day | ORAL | Status: DC
  Administered 2011-07-05 – 2011-07-06 (×2): 40 mg via ORAL
  Filled 2011-07-05 (×2): qty 1

## 2011-07-05 MED ORDER — SODIUM CHLORIDE 0.9 % IV SOLN: INTRAVENOUS | Status: DC

## 2011-07-05 MED ORDER — FENTANYL CITRATE 0.05 MG/ML IJ SOLN
INTRAMUSCULAR | Status: AC
  Filled 2011-07-05: qty 2

## 2011-07-05 MED ORDER — SODIUM CHLORIDE 0.9 % IJ SOLN: 3.0000 mL | Freq: Two times a day (BID) | INTRAMUSCULAR | Status: DC

## 2011-07-05 MED ORDER — ONDANSETRON HCL 4 MG PO TABS: 4.0000 mg | ORAL_TABLET | Freq: Four times a day (QID) | ORAL | Status: DC | PRN

## 2011-07-05 MED ORDER — MIDAZOLAM HCL 2 MG/2ML IJ SOLN
INTRAMUSCULAR | Status: AC
  Filled 2011-07-05: qty 2

## 2011-07-05 MED ORDER — ONDANSETRON HCL 4 MG/2ML IJ SOLN: 4.0000 mg | Freq: Four times a day (QID) | INTRAMUSCULAR | Status: DC | PRN

## 2011-07-05 MED ORDER — CEFAZOLIN SODIUM 1-5 GM-% IV SOLN
1.0000 g | INTRAVENOUS | Status: DC
  Filled 2011-07-05: qty 50

## 2011-07-05 MED ORDER — ATORVASTATIN CALCIUM 20 MG PO TABS
20.0000 mg | ORAL_TABLET | Freq: Every day | ORAL | Status: DC
  Filled 2011-07-05 (×2): qty 1

## 2011-07-05 MED ORDER — SODIUM CHLORIDE 0.45 % IV SOLN: INTRAVENOUS | Status: DC

## 2011-07-05 MED ORDER — SODIUM CHLORIDE 0.9 % IV SOLN: 250.0000 mL | INTRAVENOUS | Status: DC

## 2011-07-05 MED ORDER — METOPROLOL TARTRATE 25 MG PO TABS
25.0000 mg | ORAL_TABLET | Freq: Two times a day (BID) | ORAL | Status: DC
  Administered 2011-07-06: 25 mg via ORAL
  Filled 2011-07-05 (×3): qty 1

## 2011-07-05 MED ORDER — ENOXAPARIN SODIUM 40 MG/0.4ML ~~LOC~~ SOLN
40.0000 mg | SUBCUTANEOUS | Status: DC
  Administered 2011-07-06: 40 mg via SUBCUTANEOUS
  Filled 2011-07-05 (×2): qty 0.4

## 2011-07-05 MED ORDER — CHLORHEXIDINE GLUCONATE 4 % EX LIQD
60.0000 mL | Freq: Once | CUTANEOUS | Status: AC
  Administered 2011-07-05: 4 via TOPICAL
  Filled 2011-07-05 (×2): qty 60

## 2011-07-05 MED ORDER — SODIUM CHLORIDE 0.9 % IV SOLN
INTRAVENOUS | Status: DC
  Administered 2011-07-05: 1000 mL via INTRAVENOUS
  Administered 2011-07-05: 21:00:00 via INTRAVENOUS

## 2011-07-05 MED ORDER — SODIUM CHLORIDE 0.9 % IR SOLN
80.0000 mg | Status: AC
  Administered 2011-07-05: 80 mg
  Filled 2011-07-05: qty 2

## 2011-07-05 MED ORDER — LEVOTHYROXINE SODIUM 50 MCG PO TABS
50.0000 ug | ORAL_TABLET | Freq: Every day | ORAL | Status: DC
  Administered 2011-07-05 – 2011-07-06 (×2): 50 ug via ORAL
  Filled 2011-07-05 (×5): qty 1

## 2011-07-05 MED ORDER — ASPIRIN EC 325 MG PO TBEC
325.0000 mg | DELAYED_RELEASE_TABLET | Freq: Every day | ORAL | Status: DC
  Administered 2011-07-05 – 2011-07-06 (×2): 325 mg via ORAL
  Filled 2011-07-05 (×2): qty 1

## 2011-07-05 MED ORDER — ONDANSETRON 4 MG PO TBDP
ORAL_TABLET | ORAL | Status: AC
  Filled 2011-07-05: qty 1

## 2011-07-05 MED ORDER — SODIUM CHLORIDE 0.9 % IJ SOLN
3.0000 mL | Freq: Two times a day (BID) | INTRAMUSCULAR | Status: DC
  Administered 2011-07-05 (×2): 3 mL via INTRAVENOUS

## 2011-07-05 MED ORDER — ACETAMINOPHEN 325 MG PO TABS: 325.0000 mg | ORAL_TABLET | ORAL | Status: DC | PRN

## 2011-07-05 MED ORDER — HEPARIN (PORCINE) IN NACL 2-0.9 UNIT/ML-% IJ SOLN
INTRAMUSCULAR | Status: AC
  Filled 2011-07-05: qty 1000

## 2011-07-05 MED ORDER — HYDROCODONE-ACETAMINOPHEN 5-325 MG PO TABS: 1.0000 | ORAL_TABLET | ORAL | Status: DC | PRN

## 2011-07-05 MED ORDER — SODIUM CHLORIDE 0.9 % IJ SOLN
3.0000 mL | Freq: Two times a day (BID) | INTRAMUSCULAR | Status: DC
  Administered 2011-07-05: 3 mL via INTRAVENOUS

## 2011-07-05 MED ORDER — SODIUM CHLORIDE 0.9 % IV SOLN: 250.0000 mL | INTRAVENOUS | Status: DC | PRN

## 2011-07-05 MED ORDER — DIAZEPAM 5 MG PO TABS
5.0000 mg | ORAL_TABLET | ORAL | Status: AC
  Administered 2011-07-05: 5 mg via ORAL
  Filled 2011-07-05: qty 1

## 2011-07-05 MED ORDER — CEFAZOLIN SODIUM 1-5 GM-% IV SOLN
1.0000 g | Freq: Four times a day (QID) | INTRAVENOUS | Status: AC
  Administered 2011-07-05 – 2011-07-06 (×3): 1 g via INTRAVENOUS
  Filled 2011-07-05 (×3): qty 50

## 2011-07-05 NOTE — Progress Notes (Signed)
Abigail Taylor from the lab notified the nurse of a critical lab; Troponin .31. Nurse notified K. Schorr about critical troponin. Patient is not currently experiencing chest pain. [name]

## 2011-07-05 NOTE — Consult Note (Signed)
Reason for Consult: PAF, chest pain, abnormal troponin, and 7 sec. Symptomatic pause with conversion to SR.   Referring Physician: [facility]  Primary cardiologist: Dr. [name] is an 78 y.o. female.    Chief Complaint: Rapid Heart rate, chest pain.   HPI: Abigail Taylor is an 78 y.o. female with history of hypothyroidism on supplement, hyperlipidemia on Crestor, presented to [facility] emergency room complaining of chest tightness and rapid heart rate. She said that she has been experiencing chest discomfort with exertion. There has been no accompanying shortness of breath, lightheadedness, nausea, vomiting, or diaphoresis. Evaluation in emergency room included an EKG which shows atrial fibrillation with rapid ventricular rate. She spontaneously converted to normal sinus rhythm, but prior to conversion, she had a 7 second sinus pause whereupon she felt as if she is going to "go out". Her laboratory studies were rather unremarkable. Her cardiac markers are mildly elevated. After consultation with [facility] and vascular cardiology, hospitalist was asked to admit her for further monitoring of her rhythm. We are now seeing her in consult and will assume care of the patient.  Currently in SR, and no complaints.  Her chest pain improved once her heart rate was controlled.  Last seen by Dr. Royann Shivers 12/31/10 for arotic insufficiency. This has not been severe, just monitored. Last echo 2010 with EF > 55% .  Remote cath in the 90's.   Past Medical History  Diagnosis Date  . Hypothyroidism   . Hyperlipidemia     History reviewed. No pertinent past surgical history.  No family history on file. Social History:  reports that she has never smoked. She does not have any smokeless tobacco history on file. She reports that she does not drink alcohol or use illicit drugs.  Allergies: No Known Allergies  Medications Prior to Admission  Medication Sig Dispense Refill  .  levothyroxine (SYNTHROID, LEVOTHROID) 50 MCG tablet Take 50 mcg by mouth daily.      Marland Kitchen omeprazole (PRILOSEC) 20 MG capsule Take 20 mg by mouth daily.      . rosuvastatin (CRESTOR) 10 MG tablet Take 10 mg by mouth at bedtime.        Results for orders placed during the hospital encounter of 07/04/11 (from the past 48 hour(s))  CBC     Status: Normal   Collection Time   07/04/11  9:24 PM      Component Value Range Comment   WBC 8.9  4.0 - 10.5 (K/uL)    RBC 4.91  3.87 - 5.11 (MIL/uL)    Hemoglobin 14.3  12.0 - 15.0 (g/dL)    HCT 13.0  86.5 - 78.4 (%)    MCV 85.3  78.0 - 100.0 (fL)    MCH 29.1  26.0 - 34.0 (pg)    MCHC 34.1  30.0 - 36.0 (g/dL)    RDW 69.6  29.5 - 28.4 (%)    Platelets 213  150 - 400 (K/uL)   DIFFERENTIAL     Status: Normal   Collection Time   07/04/11  9:24 PM      Component Value Range Comment   Neutrophils Relative 67  43 - 77 (%)    Neutro Abs 5.9  1.7 - 7.7 (K/uL)    Lymphocytes Relative 22  12 - 46 (%)    Lymphs Abs 1.9  0.7 - 4.0 (K/uL)    Monocytes Relative 9  3 - 12 (%)    Monocytes Absolute 0.8  0.1 -  1.0 (K/uL)    Eosinophils Relative 2  0 - 5 (%)    Eosinophils Absolute 0.2  0.0 - 0.7 (K/uL)    Basophils Relative 1  0 - 1 (%)    Basophils Absolute 0.0  0.0 - 0.1 (K/uL)   POCT I-STAT TROPONIN I     Status: Normal   Collection Time   07/04/11  9:43 PM      Component Value Range Comment   Troponin i, poc 0.00  0.00 - 0.08 (ng/mL)    Comment 3            POCT I-STAT, CHEM 8     Status: Abnormal   Collection Time   07/04/11  9:45 PM      Component Value Range Comment   Sodium 143  135 - 145 (mEq/L)    Potassium 3.9  3.5 - 5.1 (mEq/L)    Chloride 108  96 - 112 (mEq/L)    BUN 23  6 - 23 (mg/dL)    Creatinine, Ser 1.61  0.50 - 1.10 (mg/dL)    Glucose, Bld 096 (*) 70 - 99 (mg/dL)    Calcium, Ion 0.45  1.12 - 1.32 (mmol/L)    TCO2 22  0 - 100 (mmol/L)    Hemoglobin 14.6  12.0 - 15.0 (g/dL)    HCT 40.9  81.1 - 91.4 (%)   BASIC METABOLIC PANEL     Status:  Abnormal   Collection Time   07/04/11  9:46 PM      Component Value Range Comment   Sodium 140  135 - 145 (mEq/L)    Potassium 3.9  3.5 - 5.1 (mEq/L)    Chloride 104  96 - 112 (mEq/L)    CO2 22  19 - 32 (mEq/L)    Glucose, Bld 163 (*) 70 - 99 (mg/dL)    BUN 23  6 - 23 (mg/dL)    Creatinine, Ser 7.82  0.50 - 1.10 (mg/dL)    Calcium 9.6  8.4 - 10.5 (mg/dL)    GFR calc non Af Amer 58 (*) >90 (mL/min)    GFR calc Af Amer 67 (*) >90 (mL/min)   TROPONIN I     Status: Normal   Collection Time   07/04/11  9:46 PM      Component Value Range Comment   Troponin I <0.30  <0.30 (ng/mL)   PROTIME-INR     Status: Normal   Collection Time   07/04/11  9:46 PM      Component Value Range Comment   Prothrombin Time 12.8  11.6 - 15.2 (seconds)    INR 0.94  0.00 - 1.49    APTT     Status: Normal   Collection Time   07/04/11  9:46 PM      Component Value Range Comment   aPTT 26  24 - 37 (seconds)   CBC     Status: Normal   Collection Time   07/05/11  2:26 AM      Component Value Range Comment   WBC 8.4  4.0 - 10.5 (K/uL)    RBC 4.53  3.87 - 5.11 (MIL/uL)    Hemoglobin 13.0  12.0 - 15.0 (g/dL)    HCT 95.6  21.3 - 08.6 (%)    MCV 85.7  78.0 - 100.0 (fL)    MCH 28.7  26.0 - 34.0 (pg)    MCHC 33.5  30.0 - 36.0 (g/dL)    RDW 57.8  46.9 - 62.9 (%)  Platelets 209  150 - 400 (K/uL)   CREATININE, SERUM     Status: Abnormal   Collection Time   07/05/11  2:26 AM      Component Value Range Comment   Creatinine, Ser 0.85  0.50 - 1.10 (mg/dL)    GFR calc non Af Amer 64 (*) >90 (mL/min)    GFR calc Af Amer 74 (*) >90 (mL/min)   CARDIAC PANEL(CRET KIN+CKTOT+MB+TROPI)     Status: Abnormal   Collection Time   07/05/11  2:26 AM      Component Value Range Comment   Total CK 75  7 - 177 (U/L)    CK, MB 3.5  0.3 - 4.0 (ng/mL)    Troponin I 0.31 (*) <0.30 (ng/mL)    Relative Index RELATIVE INDEX IS INVALID  0.0 - 2.5    BASIC METABOLIC PANEL     Status: Abnormal   Collection Time   07/05/11  5:15 AM       Component Value Range Comment   Sodium 144  135 - 145 (mEq/L)    Potassium 3.8  3.5 - 5.1 (mEq/L)    Chloride 111  96 - 112 (mEq/L)    CO2 24  19 - 32 (mEq/L)    Glucose, Bld 103 (*) 70 - 99 (mg/dL)    BUN 18  6 - 23 (mg/dL)    Creatinine, Ser 0.98  0.50 - 1.10 (mg/dL)    Calcium 8.8  8.4 - 10.5 (mg/dL)    GFR calc non Af Amer 78 (*) >90 (mL/min)    GFR calc Af Amer >90  >90 (mL/min)   CBC     Status: Normal   Collection Time   07/05/11  5:15 AM      Component Value Range Comment   WBC 7.8  4.0 - 10.5 (K/uL)    RBC 4.38  3.87 - 5.11 (MIL/uL)    Hemoglobin 12.5  12.0 - 15.0 (g/dL)    HCT 11.9  14.7 - 82.9 (%)    MCV 85.2  78.0 - 100.0 (fL)    MCH 28.5  26.0 - 34.0 (pg)    MCHC 33.5  30.0 - 36.0 (g/dL)    RDW 56.2  13.0 - 86.5 (%)    Platelets 189  150 - 400 (K/uL)   CARDIAC PANEL(CRET KIN+CKTOT+MB+TROPI)     Status: Abnormal   Collection Time   07/05/11  9:20 AM      Component Value Range Comment   Total CK 87  7 - 177 (U/L)    CK, MB 4.1 (*) 0.3 - 4.0 (ng/mL)    Troponin I 0.36 (*) <0.30 (ng/mL)    Relative Index RELATIVE INDEX IS INVALID  0.0 - 2.5     Dg Chest Portable 1 View  07/04/2011  *RADIOLOGY REPORT*  Clinical Data: Chest pain.  PORTABLE CHEST - 1 VIEW  Comparison: 05/06/2011  Findings: Midline trachea.  Normal heart size and mediastinal contours for age.  No definite pleural fluid.  Biapical pleural thickening.  Bibasilar mild subsegmental atelectasis.  Lungs otherwise clear.  IMPRESSION: No acute cardiopulmonary disease.  Original Report Authenticated By: Consuello Bossier, M.D.    ROS: General:no near syncope until pause in the ER. No lightheadedness. Skin:no rashes, no ulcers HEENT:no blurred vision, no double vision. CV:+ chest pain with RVR which improved with slowing and resolution of a. Fib. PUL:no SOB GI:no diarrhea, constipation or melena GU:no hematuria, no dysuria MS:no joint pain, no back pain Neuro:no syncope, no  headache Endo:+ Thyroid disease, no  diabetes.   Blood pressure 136/69, pulse 73, temperature 97.9 F (36.6 C), temperature source Oral, resp. rate 18, height 5\' 6"  (1.676 m), weight 68.3 kg (150 lb 9.2 oz), SpO2 97.00%. PE: General:alert and oriented X3, no complaints currently. Skin:warm and dry brisk capillary refill. HEENT:normocephalic, sclera clear Neck: No JVD, no carotid bruits Heart:RRR, non displaced apical impulse, 1-2/6 aspirative diastolic murmur at LLSB Lungs:clear to auscultation Abd:  NT/ND, BS normal, no bruits Ext:No edema, 2+ distal pulses in all 4 extremities Neuro:alert and oriented X 3, MAE, follows commands.     Assessment/Plan Patient Active Problem List  Diagnoses  . Hypothyroidism  . A-fib, paroxysmal, now resolved 07/05/11  . Sinus pause, 7 seconds  . Hypercholesterolemia  . Angina at rest, with the tachycardia  . Aortic insufficiency   PLAN:Dr. Abbagale Goguen has examined the patient  Plan will be to place PPM today if pt. Is agreeable.  INGOLD,LAURA R 07/05/2011, 11:41 AM    I have seen and examined the patient along with INGOLD,LAURA R, PA.  I have reviewed the chart, notes and new data.  I agree with PA's note.  Key new complaints: now back to normal, almost complete syncope associated with 7 sec post-arrhythmic sinus arrest Key examination changes: as above Key new findings / data: minimal increase in cardiac enzymes, may be due to tachycardia  PLAN: Recommend dual chamber permanent pacemaker - risks and benefits discussed at length and she agrees to proceed.  Will subsequently need chronic warfarin anticoagulation (or equivalent) and rate control medications such as a betablocker. After pacemaker heals, will recommend outpatient vasodilator perfusion scan to reassess for CAD in view of CP and abnormal enzymes.  Thurmon Fair, MD, [facility] and [facility] [phone] 07/05/2011, 12:33 PM

## 2011-07-05 NOTE — H&P (Signed)
PCP:   Kirk Ruths, MD, MD   Chief Complaint: Chest tightness   HPI: Abigail Taylor is an 78 y.o. female with history of hypothyroidism on supplement, hyperlipidemia on Crestor, presents to [facility] emergency room complaining of chest tightness. She said that she has been experiencing chest discomfort with exertion. There has been no accompanying shortness of breath, lightheadedness, nausea, vomiting, or diaphoresis. Evaluation in emergency room included an EKG which shows atrial fibrillation with rapid ventricular rate. She spontaneously converted to normal sinus rhythm, but prior to conversion, she had a 7 second sinus pause whereupon she felt as if she is going to "go out". Her laboratory study was rather unremarkable. Her cardiac markers were negative. After consultation with [facility] and vascular cardiology, hospitalist was asked to admit her for further monitoring of her rhythm.   Rewiew of Systems:  The patient denies anorexia, fever, weight loss,, vision loss, decreased hearing, hoarseness, syncope, dyspnea on exertion, peripheral edema, balance deficits, hemoptysis, abdominal pain, melena, hematochezia, severe indigestion/heartburn, hematuria, incontinence, genital sores, muscle weakness, suspicious skin lesions, transient blindness, difficulty walking, depression, unusual weight change, abnormal bleeding, enlarged lymph nodes, angioedema, and breast masses.    Past Medical History  Diagnosis Date  . Hypothyroidism   . Hyperlipidemia     History reviewed. No pertinent past surgical history.  Medications:  HOME MEDS: Prior to Admission medications   Medication Sig Start Date End Date Taking? Authorizing Provider  levothyroxine (SYNTHROID, LEVOTHROID) 50 MCG tablet Take 50 mcg by mouth daily.   Yes Historical Provider, MD  omeprazole (PRILOSEC) 20 MG capsule Take 20 mg by mouth daily.   Yes Historical Provider, MD  rosuvastatin (CRESTOR) 10 MG tablet Take 10 mg by  mouth at bedtime.   Yes Historical Provider, MD     Allergies:  No Known Allergies  Social History:   reports that she has never smoked. She does not have any smokeless tobacco history on file. She reports that she does not drink alcohol or use illicit drugs.  Family History: No family history on file.   Physical Exam: Filed Vitals:   07/05/11 0045 07/05/11 0100 07/05/11 0115 07/05/11 0135  BP: 128/62 117/50 119/55 136/69  Pulse: 65 63 61 73  Temp:    97.9 F (36.6 C)  TempSrc:    Oral  Resp: 17 17 11 18   Height:    5\' 6"  (1.676 m)  Weight:    68.3 kg (150 lb 9.2 oz)  SpO2: 99% 99% 100% 97%   Blood pressure 136/69, pulse 73, temperature 97.9 F (36.6 C), temperature source Oral, resp. rate 18, height 5\' 6"  (1.676 m), weight 68.3 kg (150 lb 9.2 oz), SpO2 97.00%.  GEN:  Pleasant  person lying in the stretcher in no acute distress; cooperative with exam PSYCH:  alert and oriented x4; does not appear anxious or depressed; affect is appropriate. HEENT: Mucous membranes pink and anicteric; PERRLA; EOM intact; no cervical lymphadenopathy nor thyromegaly or carotid bruit; no JVD; Breasts:: Not examined CHEST WALL: No tenderness CHEST: Normal respiration, clear to auscultation bilaterally HEART: Regular rate and rhythm; no murmurs rubs or gallops BACK: No kyphosis or scoliosis; no CVA tenderness ABDOMEN: Obese, soft non-tender; no masses, no organomegaly, normal abdominal bowel sounds; no pannus; no intertriginous candida. Rectal Exam: Not done EXTREMITIES: No bone or joint deformity; age-appropriate arthropathy of the hands and knees; no edema; no ulcerations. Genitalia: not examined PULSES: 2+ and symmetric SKIN: Normal hydration no rash or ulceration CNS: Cranial nerves 2-12  grossly intact no focal lateralizing neurologic deficit   Labs & Imaging Results for orders placed during the hospital encounter of 07/04/11 (from the past 48 hour(s))  CBC     Status: Normal    Collection Time   07/04/11  9:24 PM      Component Value Range Comment   WBC 8.9  4.0 - 10.5 (K/uL)    RBC 4.91  3.87 - 5.11 (MIL/uL)    Hemoglobin 14.3  12.0 - 15.0 (g/dL)    HCT 29.5  62.1 - 30.8 (%)    MCV 85.3  78.0 - 100.0 (fL)    MCH 29.1  26.0 - 34.0 (pg)    MCHC 34.1  30.0 - 36.0 (g/dL)    RDW 65.7  84.6 - 96.2 (%)    Platelets 213  150 - 400 (K/uL)   DIFFERENTIAL     Status: Normal   Collection Time   07/04/11  9:24 PM      Component Value Range Comment   Neutrophils Relative 67  43 - 77 (%)    Neutro Abs 5.9  1.7 - 7.7 (K/uL)    Lymphocytes Relative 22  12 - 46 (%)    Lymphs Abs 1.9  0.7 - 4.0 (K/uL)    Monocytes Relative 9  3 - 12 (%)    Monocytes Absolute 0.8  0.1 - 1.0 (K/uL)    Eosinophils Relative 2  0 - 5 (%)    Eosinophils Absolute 0.2  0.0 - 0.7 (K/uL)    Basophils Relative 1  0 - 1 (%)    Basophils Absolute 0.0  0.0 - 0.1 (K/uL)   POCT I-STAT TROPONIN I     Status: Normal   Collection Time   07/04/11  9:43 PM      Component Value Range Comment   Troponin i, poc 0.00  0.00 - 0.08 (ng/mL)    Comment 3            POCT I-STAT, CHEM 8     Status: Abnormal   Collection Time   07/04/11  9:45 PM      Component Value Range Comment   Sodium 143  135 - 145 (mEq/L)    Potassium 3.9  3.5 - 5.1 (mEq/L)    Chloride 108  96 - 112 (mEq/L)    BUN 23  6 - 23 (mg/dL)    Creatinine, Ser 9.52  0.50 - 1.10 (mg/dL)    Glucose, Bld 841 (*) 70 - 99 (mg/dL)    Calcium, Ion 3.24  1.12 - 1.32 (mmol/L)    TCO2 22  0 - 100 (mmol/L)    Hemoglobin 14.6  12.0 - 15.0 (g/dL)    HCT 40.1  02.7 - 25.3 (%)   BASIC METABOLIC PANEL     Status: Abnormal   Collection Time   07/04/11  9:46 PM      Component Value Range Comment   Sodium 140  135 - 145 (mEq/L)    Potassium 3.9  3.5 - 5.1 (mEq/L)    Chloride 104  96 - 112 (mEq/L)    CO2 22  19 - 32 (mEq/L)    Glucose, Bld 163 (*) 70 - 99 (mg/dL)    BUN 23  6 - 23 (mg/dL)    Creatinine, Ser 6.64  0.50 - 1.10 (mg/dL)    Calcium 9.6  8.4 - 10.5  (mg/dL)    GFR calc non Af Amer 58 (*) >90 (mL/min)    GFR calc Af  Amer 67 (*) >90 (mL/min)   TROPONIN I     Status: Normal   Collection Time   07/04/11  9:46 PM      Component Value Range Comment   Troponin I <0.30  <0.30 (ng/mL)   PROTIME-INR     Status: Normal   Collection Time   07/04/11  9:46 PM      Component Value Range Comment   Prothrombin Time 12.8  11.6 - 15.2 (seconds)    INR 0.94  0.00 - 1.49    APTT     Status: Normal   Collection Time   07/04/11  9:46 PM      Component Value Range Comment   aPTT 26  24 - 37 (seconds)    Dg Chest Portable 1 View  07/04/2011  *RADIOLOGY REPORT*  Clinical Data: Chest pain.  PORTABLE CHEST - 1 VIEW  Comparison: 05/06/2011  Findings: Midline trachea.  Normal heart size and mediastinal contours for age.  No definite pleural fluid.  Biapical pleural thickening.  Bibasilar mild subsegmental atelectasis.  Lungs otherwise clear.  IMPRESSION: No acute cardiopulmonary disease.  Original Report Authenticated By: Consuello Bossier, M.D.      Assessment Present on Admission:  .Hypothyroidism .Sinus pause .Hypercholesterolemia Exertional chestpain.   PLAN: Will admit her to telemetry, obtain cardiac echo, check TSH and continue her Synthroid supplement, continue her Crestor. She's not on any negative chronotropic, and her 7 second pause is concerning. Cardiology has been consulted and will see her in consultation tomorrow. She also described exertional chest discomfort and will likely need another stress test. She had a cardiac catheterization but it was in 1991, thus unhelpful. She is otherwise stable, full code, and will be admitted to [facility].   Other plans as per orders.    Asheley Hellberg 07/05/2011, 2:25 AM

## 2011-07-05 NOTE — ED Notes (Signed)
Admitting MD at bedside.

## 2011-07-05 NOTE — Plan of Care (Signed)
Problem: Phase II Progression Outcomes Goal: Other Phase II Outcomes/Goals Outcome: Completed/Met Date Met:  07/05/11 2 d echo  Problem: Phase III Progression Outcomes Goal: Discharge plan remains appropriate-arrangements made Outcome: Completed/Met Date Met:  07/05/11 Home with spouse

## 2011-07-05 NOTE — ED Notes (Signed)
Report called to Reggie RN pt ready for transport to floor. Pt denies any complaints at this time.

## 2011-07-05 NOTE — ED Notes (Signed)
Pt transported to unit via monitor and nurse

## 2011-07-05 NOTE — Progress Notes (Addendum)
Patient admitted after midnight by Dr. Conley Rolls; please refer to his H & P for details. Now w/o any CP and sinus bradycardia on telemetry. Cardiac enzymes with elevation of troponin at 0.36 and CKMB of 4.1. Cardiology [facility] has been consulted and will see patient and determine further evaluation and treatment. At this point will follow 2-D echo and also continue cycling her cardiac enzymes. Follow TSH, lipid panel and for now in anticipation of any procedure by cardiology will keep NPO. After discussing presentation and case with cardiology group they have decided to take over the patient care and decide on stress test vs cath (due to elevated CE'z and CP); also to determine needs of pacemaker vs medication adjustments for the 7 seconds pause and A. Fib with RVR episode.  Dr. Gwenlyn Perking  [phone]

## 2011-07-05 NOTE — CV Procedure (Signed)
Dross,Jaylin S Female, 78 y.o., November 12, 1931  Location: [facility]  Bed: 2027-01  MRN: [id number]  CSN: [id number]  Procedure report  Procedure performed:  1. Implantation of new dual chamber permanent pacemaker 2. Fluoroscopy 3. Light sedation    Reason for procedure: Symptomatic bradycardia due to: Sinus node dysfunction Sinus arrest Tachycardia-bradycardia syndrome Paroxysmal atrial fibrillation with rapid ventricular response  Procedure performed by: Thurmon Fair, MD  Complications: None  Estimated blood loss: <10 mL  Medications administered during procedure: Ancef 1 g intravenously Lidocaine 1% 30 mL locally,  Fentanyl 50 mcg intravenously Versed 4 mg intravenously  Device details:  Generator Medtronic Adapta model ADDRL1 serial number [id number] H Right atrial lead Medtronic 5076-45 cm serial number [id number] Right ventricular lead Medtronic 5076-45 cm serial number [id number]  Procedure details:  After the risks and benefits of the procedure were discussed the patient provided informed consent and was brought to the cardiac cath lab in the fasting state. The patient was prepped and draped in usual sterile fashion. Local anesthesia with 1% lidocaine was administered to to the left infraclavicular area. A 5-6 cm horizontal incision was made parallel with and 2-3 cm caudal to the left clavicle. Using electrocautery and blunt dissection a prepectoral pocket was created down to the level of the pectoralis major muscle fascia. The pocket was carefully inspected for hemostasis. An antibiotic-soaked sponge was placed in the pocket.  Under fluoroscopic guidance and using the modified Seldinger technique 2 separate venipunctures were performed to access the left subclavian vein. no difficulty was encountered accessing the vein.  Two J-tip guidewires were subsequently exchanged for two 7 French safe sheaths.  Under fluoroscopic guidance the ventricular lead was  advanced to level of the mid to apical right ventricular septum and thet active-fixation helix was deployed. Prominent current of injury was seen. Satisfactory pacing and sensing parameters were recorded. There was no evidence of diaphragmatic stimulation at maximum device output. The safe sheath was peeled away and the lead was secured in place with 2-0 silk.  In similar fashion the right atrial lead was advanced to the level of the atrial appendage. The active-fixation helix was deployed. There was prominent current of injury. Satisfactory  pacing and sensing parameters were recorded. There was no evidence of diaphragmatic stimulation with pacing at maximum device output. The safe sheath was peeled away and the lead was secured in place with 2-0 silk.  The antibiotic-soaked sponge was removed from the pocket. The pocket was flushed with copious amounts of antibiotic solution. Reinspection showed excellent hemostasis..  The ventricular lead was connected to the generator and appropriate ventricular pacing was seen. Subsequently the atrial lead was also connected. Repeat testing of the lead parameters later showed excellent values.  The entire system was then carefully inserted in the pocket with care been taking that the leads and device assumed a comfortable position without pressure on the incision. Great care was taken that the leads be located deep to the generator. The pocket was then closed in layers using 2 layers of 2-0 Vicryl and cutaneous staples, after which a sterile dressing was applied.  At the end of the procedure the following lead parameters were encountered:  Right atrial lead  sensed P waves 3.2 mV, impedance 855 ohms, threshold 1.1 V at 0.5 ms pulse width.  Right ventricular lead sensed R waves 12.4 mV, impedance 1311ohms, threshold 0.8 V at 0.5 ms pulse width.   Thurmon Fair, MD, [facility] and [facility] [phone] office [phone]  pager  07/05/2011 7:04 PM

## 2011-07-05 NOTE — Care Management Note (Unsigned)
    Page 1 of 1   07/05/2011     11:05:13 AM   CARE MANAGEMENT NOTE 07/05/2011  Patient:  Abigail Taylor, Abigail Taylor   Account Number:  [id number]  Date Initiated:  07/05/2011  Documentation initiated by:  SIMMONS,Jodeci Roarty  Subjective/Objective Assessment:   ADMITTED WITH C/P; LIVES AT HOME WITH HUSBAND AND DOG; IPTA; USES [facility] IN [place].     Action/Plan:   DISCHARGE PLANNING INITIATED.   Anticipated DC Date:  07/06/2011   Anticipated DC Plan:  HOME/SELF CARE      DC Planning Services  CM consult      Choice offered to / List presented to:             Status of service:  In process, will continue to follow Medicare Important Message given?   (If response is "NO", the following Medicare IM given date fields will be blank) Date Medicare IM given:   Date Additional Medicare IM given:    Discharge Disposition:    Per UR Regulation:  Reviewed for med. necessity/level of care/duration of stay  If discussed at Long Length of Stay Meetings, dates discussed:    Comments:  07/05/11  1104  Brandy Zuba SIMMONS RN, BSN [phone] NCM WILL FOLLOW.

## 2011-07-06 ENCOUNTER — Inpatient Hospital Stay (HOSPITAL_COMMUNITY): Payer: Medicare Other

## 2011-07-06 DIAGNOSIS — I10 Essential (primary) hypertension: Secondary | ICD-10-CM | POA: Diagnosis present

## 2011-07-06 MED ORDER — METOPROLOL TARTRATE 25 MG PO TABS: 25.0000 mg | ORAL_TABLET | Freq: Two times a day (BID) | ORAL | Status: DC

## 2011-07-06 MED ORDER — ASPIRIN 325 MG PO TBEC: 325.0000 mg | DELAYED_RELEASE_TABLET | Freq: Every day | ORAL | Status: AC

## 2011-07-06 MED ORDER — ACETAMINOPHEN 325 MG PO TABS: 325.0000 mg | ORAL_TABLET | ORAL | Status: DC | PRN

## 2011-07-06 NOTE — Progress Notes (Signed)
Subjective:  No complaints  Objective:  Vital Signs in the last 24 hours: Temp:  [97.4 F (36.3 C)-97.8 F (36.6 C)] 97.4 F (36.3 C) (06/12 0336) Pulse Rate:  [60-72] 64  (06/12 0336) Resp:  [18] 18  (06/12 0336) BP: (93-153)/(45-85) 138/71 mmHg (06/12 0336) SpO2:  [96 %-97 %] 97 % (06/12 0336)  Intake/Output from previous day: No intake or output data in the 24 hours ending 07/06/11 1357  Physical Exam: General appearance: alert, cooperative and no distress Lungs: clear to auscultation bilaterally Heart: regular rate and rhythm Pacer site without hematoma   Rate: 70  Rhythm: paced  Lab Results:  Basename 07/05/11 0515 07/05/11 0226  WBC 7.8 8.4  HGB 12.5 13.0  PLT 189 209    Basename 07/05/11 0515 07/05/11 0226 07/04/11 2146  NA 144 -- 140  K 3.8 -- 3.9  CL 111 -- 104  CO2 24 -- 22  GLUCOSE 103* -- 163*  BUN 18 -- 23  CREATININE 0.79 0.85 --    Basename 07/05/11 1951 07/05/11 0920  TROPONINI 0.33* 0.36*   Hepatic Function Panel No results found for this basename: PROT,ALBUMIN,AST,ALT,ALKPHOS,BILITOT,BILIDIR,IBILI in the last 72 hours No results found for this basename: CHOL in the last 72 hours  Basename 07/04/11 2146  INR 0.94    Imaging: Imaging results have been reviewed  Cardiac Studies:  Assessment/Plan:   Principal Problem:  *Angina at rest, with rapid AF Active Problems:  A-fib, paroxysmal, now resolved 07/05/11  Sinus pause, 7 seconds post conversion, S/P MDT pacemaker 07/05/11  Hypothyroidism  Hypercholesterolemia  HTN (hypertension)  Elevated troponin, trace positive secondary to rapid AF   Plan- D/c today --> site check in one week. BB for Afib  Corine Shelter PA-C 07/06/2011, 1:57 PM  ATTENDING ATTESTATION:  I have seen and examined the patient along with Corine Shelter, PA.  I have reviewed the chart, notes and new data.  I agree with Luke's note.  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   Key new complaints: sore at [facility] site, but no further CP  Key examination changes: Agree with exam above, mild serosanguinous staining on PPM gauze.  Key new findings / data: CXR personally reviewed - no PTC.    PLAN:  BB for Afib  OP Myoview to evaluate "Angina" as ischemic etiology for Afib.  On ASA & statin - not on long term Anticoagulation -- will defer to OP evaluation once stable post-op PPM.   D/c today with close f/u - for Pacer check /staple removal, followed by OP Myoview.  Marykay Lex, M.D., M.S. [facility] [address] [place], [place]  [address]  [phone]  07/06/2011 2:27 PM

## 2011-07-06 NOTE — Progress Notes (Signed)
  Echocardiogram 2D Echocardiogram has been performed.  Abigail Taylor A 07/06/2011, 11:18 AM

## 2011-07-06 NOTE — Discharge Summary (Signed)
Patient ID: NIYANA CHESBRO,  MRN: [id number], DOB/AGE: January 16, 1932 78 y.o.  Admit date: 07/04/2011 Discharge date: 07/06/2011  Primary Care Provider: Dr Regino Schultze Primary Cardiologist: Dr Royann Shivers  Discharge Diagnoses Principal Problem:  *Angina at rest, with rapid AF Active Problems:  A-fib, paroxysmal, now resolved 07/05/11  Sinus pause, 7 seconds post conversion, S/P MDT pacemaker 07/05/11  Hypothyroidism  Hypercholesterolemia  HTN (hypertension)  Elevated troponin, trace positive secondary to rapid AF    Procedures: MDT PTVDP 07/05/11   Hospital Course:  BRITTNYE JOSEPHS is an 78 y.o. female with history of hypothyroidism on supplement, hyperlipidemia on Crestor, who presents to [facility] emergency room complaining of chest tightness. She said that she had been experiencing chest discomfort with exertion. There has been no accompanying shortness of breath, lightheadedness, nausea, vomiting, or diaphoresis. Evaluation in emergency room included an EKG which showed atrial fibrillation with rapid ventricular rate. She spontaneously converted to normal sinus rhythm, but prior to conversion, she had a 7 second sinus pause whereupon she felt as if she is going to "go out". Her laboratory study was rather unremarkable. Her cardiac markers were trace positive. She had been seen by Dr Royann Shivers in the past for AI. She apparently has had a remote cath in the 90's. The patient has an echo 07/06/11 which showed an EF of 55-60% with mild AI. On 07/05/11 she had a MDT pacemaker implanted without complications. She will follow up in one for a site check and the have an OP Myoview.   Discharge Vitals:  Blood pressure 138/71, pulse 64, temperature 97.4 F (36.3 C), temperature source Oral, resp. rate 18, height 5\' 6"  (1.676 m), weight 68.3 kg (150 lb 9.2 oz), SpO2 97.00%.    Labs: Results for orders placed during the hospital encounter of 07/04/11 (from the past 48 hour(s))  CBC     Status: Normal   Collection Time   07/04/11  9:24 PM      Component Value Range Comment   WBC 8.9  4.0 - 10.5 K/uL    RBC 4.91  3.87 - 5.11 MIL/uL    Hemoglobin 14.3  12.0 - 15.0 g/dL    HCT 40.9  81.1 - 91.4 %    MCV 85.3  78.0 - 100.0 fL    MCH 29.1  26.0 - 34.0 pg    MCHC 34.1  30.0 - 36.0 g/dL    RDW 78.2  95.6 - 21.3 %    Platelets 213  150 - 400 K/uL   DIFFERENTIAL     Status: Normal   Collection Time   07/04/11  9:24 PM      Component Value Range Comment   Neutrophils Relative 67  43 - 77 %    Neutro Abs 5.9  1.7 - 7.7 K/uL    Lymphocytes Relative 22  12 - 46 %    Lymphs Abs 1.9  0.7 - 4.0 K/uL    Monocytes Relative 9  3 - 12 %    Monocytes Absolute 0.8  0.1 - 1.0 K/uL    Eosinophils Relative 2  0 - 5 %    Eosinophils Absolute 0.2  0.0 - 0.7 K/uL    Basophils Relative 1  0 - 1 %    Basophils Absolute 0.0  0.0 - 0.1 K/uL   POCT I-STAT TROPONIN I     Status: Normal   Collection Time   07/04/11  9:43 PM      Component Value Range Comment   Troponin  i, poc 0.00  0.00 - 0.08 ng/mL    Comment 3            POCT I-STAT, CHEM 8     Status: Abnormal   Collection Time   07/04/11  9:45 PM      Component Value Range Comment   Sodium 143  135 - 145 mEq/L    Potassium 3.9  3.5 - 5.1 mEq/L    Chloride 108  96 - 112 mEq/L    BUN 23  6 - 23 mg/dL    Creatinine, Ser 1.61  0.50 - 1.10 mg/dL    Glucose, Bld 096 (*) 70 - 99 mg/dL    Calcium, Ion 0.45  4.09 - 1.32 mmol/L    TCO2 22  0 - 100 mmol/L    Hemoglobin 14.6  12.0 - 15.0 g/dL    HCT 81.1  91.4 - 78.2 %   BASIC METABOLIC PANEL     Status: Abnormal   Collection Time   07/04/11  9:46 PM      Component Value Range Comment   Sodium 140  135 - 145 mEq/L    Potassium 3.9  3.5 - 5.1 mEq/L    Chloride 104  96 - 112 mEq/L    CO2 22  19 - 32 mEq/L    Glucose, Bld 163 (*) 70 - 99 mg/dL    BUN 23  6 - 23 mg/dL    Creatinine, Ser 9.56  0.50 - 1.10 mg/dL    Calcium 9.6  8.4 - 21.3 mg/dL    GFR calc non Af Amer 58 (*) >90 mL/min    GFR calc Af Amer 67  (*) >90 mL/min   TROPONIN I     Status: Normal   Collection Time   07/04/11  9:46 PM      Component Value Range Comment   Troponin I <0.30  <0.30 ng/mL   PROTIME-INR     Status: Normal   Collection Time   07/04/11  9:46 PM      Component Value Range Comment   Prothrombin Time 12.8  11.6 - 15.2 seconds    INR 0.94  0.00 - 1.49   APTT     Status: Normal   Collection Time   07/04/11  9:46 PM      Component Value Range Comment   aPTT 26  24 - 37 seconds   CBC     Status: Normal   Collection Time   07/05/11  2:26 AM      Component Value Range Comment   WBC 8.4  4.0 - 10.5 K/uL    RBC 4.53  3.87 - 5.11 MIL/uL    Hemoglobin 13.0  12.0 - 15.0 g/dL    HCT 08.6  57.8 - 46.9 %    MCV 85.7  78.0 - 100.0 fL    MCH 28.7  26.0 - 34.0 pg    MCHC 33.5  30.0 - 36.0 g/dL    RDW 62.9  52.8 - 41.3 %    Platelets 209  150 - 400 K/uL   CREATININE, SERUM     Status: Abnormal   Collection Time   07/05/11  2:26 AM      Component Value Range Comment   Creatinine, Ser 0.85  0.50 - 1.10 mg/dL    GFR calc non Af Amer 64 (*) >90 mL/min    GFR calc Af Amer 74 (*) >90 mL/min   TSH     Status: Normal  Collection Time   07/05/11  2:26 AM      Component Value Range Comment   TSH 2.388  0.350 - 4.500 uIU/mL   CARDIAC PANEL(CRET KIN+CKTOT+MB+TROPI)     Status: Abnormal   Collection Time   07/05/11  2:26 AM      Component Value Range Comment   Total CK 75  7 - 177 U/L    CK, MB 3.5  0.3 - 4.0 ng/mL    Troponin I 0.31 (*) <0.30 ng/mL    Relative Index RELATIVE INDEX IS INVALID  0.0 - 2.5   BASIC METABOLIC PANEL     Status: Abnormal   Collection Time   07/05/11  5:15 AM      Component Value Range Comment   Sodium 144  135 - 145 mEq/L    Potassium 3.8  3.5 - 5.1 mEq/L    Chloride 111  96 - 112 mEq/L    CO2 24  19 - 32 mEq/L    Glucose, Bld 103 (*) 70 - 99 mg/dL    BUN 18  6 - 23 mg/dL    Creatinine, Ser 1.61  0.50 - 1.10 mg/dL    Calcium 8.8  8.4 - 09.6 mg/dL    GFR calc non Af Amer 78 (*) >90 mL/min      GFR calc Af Amer >90  >90 mL/min   CBC     Status: Normal   Collection Time   07/05/11  5:15 AM      Component Value Range Comment   WBC 7.8  4.0 - 10.5 K/uL    RBC 4.38  3.87 - 5.11 MIL/uL    Hemoglobin 12.5  12.0 - 15.0 g/dL    HCT 04.5  40.9 - 81.1 %    MCV 85.2  78.0 - 100.0 fL    MCH 28.5  26.0 - 34.0 pg    MCHC 33.5  30.0 - 36.0 g/dL    RDW 91.4  78.2 - 95.6 %    Platelets 189  150 - 400 K/uL   CARDIAC PANEL(CRET KIN+CKTOT+MB+TROPI)     Status: Abnormal   Collection Time   07/05/11  9:20 AM      Component Value Range Comment   Total CK 87  7 - 177 U/L    CK, MB 4.1 (*) 0.3 - 4.0 ng/mL    Troponin I 0.36 (*) <0.30 ng/mL    Relative Index RELATIVE INDEX IS INVALID  0.0 - 2.5   CARDIAC PANEL(CRET KIN+CKTOT+MB+TROPI)     Status: Abnormal   Collection Time   07/05/11  7:51 PM      Component Value Range Comment   Total CK 81  7 - 177 U/L    CK, MB 4.1 (*) 0.3 - 4.0 ng/mL    Troponin I 0.33 (*) <0.30 ng/mL    Relative Index RELATIVE INDEX IS INVALID  0.0 - 2.5     Disposition:  Follow-up Information    Follow up with Thurmon Fair, MD on 07/19/2011. (at 1:00 pm  for stress test, Do not eat or drink after 6:00 am the day of the test. Do not wear  perfume or strong deodorant to the test.  )    Contact information:   [address] [place] [address] [phone]       Follow up with Leone Brand, NP on 07/13/2011. (at 11:00 am for pacer site check.)    Contact information:   [address] [place]  [place] [address] [phone]          Discharge Medications:  Medication List  As of 07/06/2011  2:02 PM   TAKE these medications         acetaminophen 325 MG tablet   Commonly known as: TYLENOL   Take 1-2 tablets (325-650 mg total) by mouth every 4 (four) hours as needed.      aspirin 325 MG EC tablet   Take 1 tablet (325 mg total) by mouth daily.      levothyroxine 50 MCG tablet   Commonly known as: SYNTHROID,  LEVOTHROID   Take 50 mcg by mouth daily.      metoprolol tartrate 25 MG tablet   Commonly known as: LOPRESSOR   Take 1 tablet (25 mg total) by mouth 2 (two) times daily.      omeprazole 20 MG capsule   Commonly known as: PRILOSEC   Take 20 mg by mouth daily.      rosuvastatin 10 MG tablet   Commonly known as: CRESTOR   Take 10 mg by mouth at bedtime.            Duration of Discharge Encounter: Greater than 30 minutes including physician time.  Jolene Provost PA-C 07/06/2011 2:02 PM  ATTENDING ATTESTATION:  I have seen and examined the patient along with Corine Shelter, PA. I have reviewed the chart, notes and new data. I agree with Luke's note.  Brief Description: 78 y/o woman with Symptomatic PAF complicated by prolonged ~7sec pause on conversion - s/p PPM yesterday (6/11) for tachy-brady syndrome.  Complaints: sore at [facility] site, but no further CP  Findings / data: CXR personally reviewed - no PTC.  PLAN:  BB for Afib  OP Myoview to evaluate "Angina" as ischemic etiology for Afib.  On ASA & statin - not on long term Anticoagulation -- will defer to OP evaluation once stable post-op PPM. D/c today with close f/u - for Pacer check /staple removal, followed by OP Myoview.   Marykay Lex, M.D., M.S.  [facility]  [address]  [place], [place] [address]  [phone]  07/06/2011  2:27 PM

## 2011-07-06 NOTE — Discharge Instructions (Signed)
Pacemaker Implantation A pacemaker (pacer) is a small device that acts as a backup or takes over for the natural pacemaker of the heart. The heart has its own electrical system to regulate the beating of the heart muscles. The heart pumps best when it beats in a regular, coordinated rhythm.  A pacer consists of a small device (generator) which produces electrical signals that tell your heart to beat. The generator contains a lithium battery and a tiny computer. Wires (leads) connect the generator to the heart. The pacer is placed under the skin through a small cut. It senses every heartbeat and only fires when the heart rate falls outside certain levels. When the pacer triggers a heart beat, it is called "capture." PROBLEMS THAT MAY BE HELPED BY A PACEMAKER:  Your heart rate is sometimes too slow or irregular.   Fainting, dizziness, weakness or confusion as a result of low blood flow.   Shortness of breath.   Chest pain or angina if the heart needs more blood and oxygen.   Disturbed sleep as a result of abnormal heart rhythm.   Palpitations or the feeling that the heart is beating too fast, too hard or in an irregular way.   Weak heart muscle pumping ability.  PROCEDURE   The pacer may be placed under the skin near the collarbone, while you are under sedation. An abdominal wall location may be another option.   The leads are inserted into a vein that lies just under the collarbone, then guided into place under x-ray. The tips of the wires touch the inside of the heart. The near end of the pacer wires are connected to the generator under the skin.   For individuals with thinner chest walls, it may be possible to feel the device under the skin, and a slight bump may be seen.  HOME CARE INSTRUCTIONS   Keep the incision dry for a week after the procedure.   For about 8 weeks, avoid sudden or jerky movements that pull your arm away from your body. This could change the position of the leads.     Take medicine exactly as directed.   Learn how to check your pulse. Follow directions about when to call or be concerned.   Be physically active every day. Ask your caregiver how and when to increase activity.   Household appliances do not interfere with pacemakers.   Travel by car, train or airplane should not be a problem. Carry a pacemaker ID card in case the device sets off a metal detector.  PACEMAKER CARE:  Avoid putting pressure over the area where the pacer was put in.   Digital cell phones should be kept 12 inches away from the pacemaker. Hold them at the ear on the side opposite of the pacer.   Never leave a cell phone in a pocket over the pacemaker.   Avoid strong electro-magnetic fields. You will not be able to have an MRI scan because of the strong magnets.   Pacer batteries last about 5 years and give off warning signals when they are running low on power. Pacers may be checked every 3 months. This allows plenty of time to change the generator when it is running low on power.   Changing the battery means removing the old generator through the same cut and plugging the existing wires into the new generator.   An EKG or heart monitor is used to see if your pacer is working properly. Sometimes signals may be sent   over a land line phone to your clinic.   Tell emergency responders that you have a pacemaker.  SEEK MEDICAL CARE IF:  You begin to gain weight and your feet and ankles swell.   You have dizzy spells or feel weak.   Your pulse rate drops below the limit or is too fast.  SEEK IMMEDIATE MEDICAL CARE IF:  You faint or pass out.   You have chest pain or shortness of breath.   You are injured and think your pacemaker may have been damaged.   You are suddenly very tired or have pain in your back.   You are worried that your heart is not beating right or cannot feel your pulse.  Document Released: 12/31/2001 Document Revised: 12/30/2010 Document Reviewed:  03/09/2007 Exit[employer] Patient Information 2012 [employer]. 

## 2011-07-13 DIAGNOSIS — R079 Chest pain, unspecified: Secondary | ICD-10-CM | POA: Diagnosis not present

## 2011-07-13 DIAGNOSIS — I495 Sick sinus syndrome: Secondary | ICD-10-CM | POA: Diagnosis not present

## 2011-07-19 DIAGNOSIS — I4891 Unspecified atrial fibrillation: Secondary | ICD-10-CM | POA: Diagnosis not present

## 2011-07-19 DIAGNOSIS — Z950 Presence of cardiac pacemaker: Secondary | ICD-10-CM | POA: Diagnosis not present

## 2011-07-19 DIAGNOSIS — R9431 Abnormal electrocardiogram [ECG] [EKG]: Secondary | ICD-10-CM | POA: Diagnosis not present

## 2011-07-19 DIAGNOSIS — I10 Essential (primary) hypertension: Secondary | ICD-10-CM | POA: Diagnosis not present

## 2011-07-19 HISTORY — PX: NM MYOVIEW LTD: HXRAD82

## 2011-08-03 DIAGNOSIS — I495 Sick sinus syndrome: Secondary | ICD-10-CM | POA: Diagnosis not present

## 2011-08-03 DIAGNOSIS — I4891 Unspecified atrial fibrillation: Secondary | ICD-10-CM | POA: Diagnosis not present

## 2011-09-13 DIAGNOSIS — M25519 Pain in unspecified shoulder: Secondary | ICD-10-CM | POA: Diagnosis not present

## 2011-09-13 DIAGNOSIS — E039 Hypothyroidism, unspecified: Secondary | ICD-10-CM | POA: Diagnosis not present

## 2011-09-13 DIAGNOSIS — IMO0002 Reserved for concepts with insufficient information to code with codable children: Secondary | ICD-10-CM | POA: Diagnosis not present

## 2011-09-13 DIAGNOSIS — E785 Hyperlipidemia, unspecified: Secondary | ICD-10-CM | POA: Diagnosis not present

## 2011-09-13 DIAGNOSIS — R5381 Other malaise: Secondary | ICD-10-CM | POA: Diagnosis not present

## 2011-09-14 DIAGNOSIS — R5383 Other fatigue: Secondary | ICD-10-CM | POA: Diagnosis not present

## 2011-09-14 DIAGNOSIS — R5381 Other malaise: Secondary | ICD-10-CM | POA: Diagnosis not present

## 2011-09-14 DIAGNOSIS — R7309 Other abnormal glucose: Secondary | ICD-10-CM | POA: Diagnosis not present

## 2011-09-14 DIAGNOSIS — E785 Hyperlipidemia, unspecified: Secondary | ICD-10-CM | POA: Diagnosis not present

## 2011-09-14 DIAGNOSIS — E039 Hypothyroidism, unspecified: Secondary | ICD-10-CM | POA: Diagnosis not present

## 2011-10-04 DIAGNOSIS — M25519 Pain in unspecified shoulder: Secondary | ICD-10-CM | POA: Diagnosis not present

## 2011-10-10 DIAGNOSIS — M25519 Pain in unspecified shoulder: Secondary | ICD-10-CM | POA: Diagnosis not present

## 2011-10-12 DIAGNOSIS — M25519 Pain in unspecified shoulder: Secondary | ICD-10-CM | POA: Diagnosis not present

## 2011-10-13 DIAGNOSIS — M25519 Pain in unspecified shoulder: Secondary | ICD-10-CM | POA: Diagnosis not present

## 2011-10-17 DIAGNOSIS — M25519 Pain in unspecified shoulder: Secondary | ICD-10-CM | POA: Diagnosis not present

## 2011-10-20 DIAGNOSIS — M25519 Pain in unspecified shoulder: Secondary | ICD-10-CM | POA: Diagnosis not present

## 2011-10-25 DIAGNOSIS — M25519 Pain in unspecified shoulder: Secondary | ICD-10-CM | POA: Diagnosis not present

## 2011-10-27 DIAGNOSIS — M25519 Pain in unspecified shoulder: Secondary | ICD-10-CM | POA: Diagnosis not present

## 2011-11-01 DIAGNOSIS — M25519 Pain in unspecified shoulder: Secondary | ICD-10-CM | POA: Diagnosis not present

## 2011-11-04 DIAGNOSIS — Z23 Encounter for immunization: Secondary | ICD-10-CM | POA: Diagnosis not present

## 2011-11-08 DIAGNOSIS — M25519 Pain in unspecified shoulder: Secondary | ICD-10-CM | POA: Diagnosis not present

## 2011-11-15 DIAGNOSIS — M25519 Pain in unspecified shoulder: Secondary | ICD-10-CM | POA: Diagnosis not present

## 2011-11-25 DIAGNOSIS — I4891 Unspecified atrial fibrillation: Secondary | ICD-10-CM | POA: Diagnosis not present

## 2011-11-25 DIAGNOSIS — I495 Sick sinus syndrome: Secondary | ICD-10-CM | POA: Diagnosis not present

## 2011-11-25 LAB — MDC_IDC_ENUM_SESS_TYPE_REMOTE
Battery Impedance: 111 Ohm
Battery Remaining Longevity: 167 mo
Battery Voltage: 2.8 V
Brady Statistic AP VP Percent: 0 %
Brady Statistic AS VP Percent: 0 %
Date Time Interrogation Session: 20131101163532
Lead Channel Impedance Value: 481 Ohm
Lead Channel Pacing Threshold Amplitude: 0.5 V
Lead Channel Pacing Threshold Pulse Width: 0.4 ms
Lead Channel Sensing Intrinsic Amplitude: 2.8 mV
Lead Channel Sensing Intrinsic Amplitude: 22.4 mV
Lead Channel Setting Pacing Amplitude: 1.5 V
Lead Channel Setting Pacing Amplitude: 2 V
Lead Channel Setting Pacing Pulse Width: 0.4 ms
MDC IDC MSMT LEADCHNL RV IMPEDANCE VALUE: 635 Ohm
MDC IDC MSMT LEADCHNL RV PACING THRESHOLD AMPLITUDE: 0.75 V
MDC IDC MSMT LEADCHNL RV PACING THRESHOLD PULSEWIDTH: 0.4 ms
MDC IDC SET LEADCHNL RV SENSING SENSITIVITY: 5.6 mV
MDC IDC STAT BRADY AP VS PERCENT: 43 %
MDC IDC STAT BRADY AS VS PERCENT: 57 %

## 2011-12-27 ENCOUNTER — Ambulatory Visit (INDEPENDENT_AMBULATORY_CARE_PROVIDER_SITE_OTHER): Payer: Medicare Other | Admitting: Internal Medicine

## 2011-12-27 ENCOUNTER — Encounter (INDEPENDENT_AMBULATORY_CARE_PROVIDER_SITE_OTHER): Payer: Self-pay | Admitting: Internal Medicine

## 2011-12-27 VITALS — BP 100/42 | HR 60 | Temp 97.8°F | Ht 65.5 in | Wt 150.2 lb

## 2011-12-27 DIAGNOSIS — K5900 Constipation, unspecified: Secondary | ICD-10-CM

## 2011-12-27 DIAGNOSIS — Z800 Family history of malignant neoplasm of digestive organs: Secondary | ICD-10-CM

## 2011-12-27 HISTORY — DX: Constipation, unspecified: K59.00

## 2011-12-27 NOTE — Patient Instructions (Addendum)
OV in 2 weeks. Linzess Samples . Increase fiber

## 2011-12-27 NOTE — Progress Notes (Signed)
Subjective:     Patient ID: Abigail Taylor, female   DOB: [birth year], 78 y.o.   MRN: [id number]  HPI Presents today with c/o constipation. She has been taking Doculax for her constipation. She has been constipated for about a year. She also is using suppositories. She has a BM once a day. Stools are like balls, but not hard. No melena or bright red rectal bleeding. Last year her BMs were normal.  She does have hemorrhoids. Appetite is good. No weight loss. No acid reflux.  2009 Colonoscopy : scattered diverticula at sigmoid and descending colon. External hemorrhoids.    08/05/2002 EGD/Colonoscopy:Impression:  No evidence of erosive esophagitis, peptic ulcer disease or esophageal/gastric varices.  Mild portal gastropathy.  Normal terminal ileum.  2 small polyps ablated via cold biopsy both located in sigmoid colon and submitted separately.     Review of Systems see hpi  Current Outpatient Prescriptions  Medication Sig Dispense Refill  . levothyroxine (SYNTHROID, LEVOTHROID) 50 MCG tablet Take 50 mcg by mouth daily.      . metoprolol tartrate (LOPRESSOR) 25 MG tablet Take 1 tablet (25 mg total) by mouth 2 (two) times daily.  60 tablet  5  . naproxen sodium (ANAPROX) 220 MG tablet Take 220 mg by mouth as needed.      Marland Kitchen omeprazole (PRILOSEC) 20 MG capsule Take 20 mg by mouth daily.      . rosuvastatin (CRESTOR) 10 MG tablet Take 10 mg by mouth at bedtime.       Past Medical History  Diagnosis Date  . Hypothyroidism   . Hyperlipidemia   . Angina at rest, with the tachycardia 07/05/2011  . Aortic insufficiency 07/05/2011   Past Surgical History  Procedure Date  . Pacemaker insertion in june for fast heart rate     June, 2013       Objective:   Physical Exam Filed Vitals:   12/27/11 1451  BP: 100/42  Pulse: 60  Temp: 97.8 F (36.6 C)  Height: 5' 5.5" (1.664 m)  Weight: 150 lb 3.2 oz (68.13 kg)   Alert and oriented. Skin warm and dry. Oral mucosa is moist.   . Sclera  anicteric, conjunctivae is pink. Thyroid not enlarged. No cervical lymphadenopathy. Lungs clear. Heart regular rate and rhythm.  Abdomen is soft. Bowel sounds are positive. No hepatomegaly. No abdominal masses felt. No tenderness.  No edema to lower extremities.  Stool brown and guaiac negative    Assessment:    Constipation x 1 yr duration. Stool was soft in rectum.    Family hx of colon cancer Plan:    Increase fiber in diet. Will try Linzess . X 4 boxes.  OV in 2 weeks. Colonoscopy was offered and she would like to wait till next year.

## 2012-01-09 ENCOUNTER — Encounter (INDEPENDENT_AMBULATORY_CARE_PROVIDER_SITE_OTHER): Payer: Self-pay

## 2012-01-10 ENCOUNTER — Ambulatory Visit (INDEPENDENT_AMBULATORY_CARE_PROVIDER_SITE_OTHER): Payer: Medicare Other | Admitting: Internal Medicine

## 2012-01-10 ENCOUNTER — Encounter (INDEPENDENT_AMBULATORY_CARE_PROVIDER_SITE_OTHER): Payer: Self-pay | Admitting: Internal Medicine

## 2012-01-10 VITALS — BP 90/46 | HR 60 | Temp 97.9°F | Ht 65.5 in | Wt 150.8 lb

## 2012-01-10 DIAGNOSIS — K5900 Constipation, unspecified: Secondary | ICD-10-CM

## 2012-01-10 NOTE — Progress Notes (Signed)
Subjective:     Patient ID: Abigail Taylor, female   DOB: 05-13-1931, 78 y.o.   MRN: [id number]  HPIPresents today for f/u of her constipation.  She was evaluated 12/27/2011 for constipation. Samples of Linzess given to patient. She tells me she took a total of 3. After the first dose she had a BM about 3 hrs later. She tells me she is having a BM daily. No melena or bright red rectal bleeding. Stools are normal caliber now.  Appetite is good. No weight loss. No abdominal pain. No melena or bright red rectal bleeding. No acid reflux   Any use of NSAIDs: ASA 325mg  daily. No other NSAIDs. Any recent travel: no Any melena or bright red rectal bleeding: no Any recent antibiotics: no Any etoh abuse: no Any tattoos:No Any IV drug use:  NO   2009 Colonoscopy : scattered diverticula at sigmoid and descending colon. External hemorrhoids.  EGD/Colonoscopy: 08/05/2002 EGD/Colonoscopy:Impression:  No evidence of erosive esophagitis, peptic ulcer disease or esophageal/gastric varices.  Mild portal gastropathy.  Normal terminal ileum.  2 small polyps ablated via cold biopsy both located in sigmoid colon and submitted separately.      Review of Systems see hpi\ Current Outpatient Prescriptions  Medication Sig Dispense Refill  . levothyroxine (SYNTHROID, LEVOTHROID) 50 MCG tablet Take 50 mcg by mouth daily.      . metoprolol tartrate (LOPRESSOR) 25 MG tablet Take 1 tablet (25 mg total) by mouth 2 (two) times daily.  60 tablet  5  . naproxen sodium (ANAPROX) 220 MG tablet Take 220 mg by mouth as needed.      Marland Kitchen omeprazole (PRILOSEC) 20 MG capsule Take 20 mg by mouth daily.      . rosuvastatin (CRESTOR) 10 MG tablet Take 10 mg by mouth at bedtime.       Past Medical History  Diagnosis Date  . Hypothyroidism   . Hyperlipidemia   . Angina at rest, with the tachycardia 07/05/2011  . Aortic insufficiency 07/05/2011  . Constipation 12/27/2011   Past Surgical History  Procedure Date  . Pacemaker  insertion in june for fast heart rate     June, 2013   No Known Allergies      Objective:   Physical Exam Filed Vitals:   01/10/12 1420  BP: 90/46  Pulse: 60  Temp: 97.9 F (36.6 C)  Height: 5' 5.5" (1.664 m)  Weight: 150 lb 12.8 oz (68.402 kg)   Alert and oriented. Skin warm and dry. Oral mucosa is moist.   . Sclera anicteric, conjunctivae is pink. Thyroid not enlarged. No cervical lymphadenopathy. Lungs clear. Heart regular rate and rhythm.  Abdomen is soft. Bowel sounds are positive. No hepatomegaly. No abdominal masses felt. No tenderness.  No edema to lower extremities      Assessment:   Constipation which has resolved. She does however continues to take Glycerin supp. The LInzess did give her diarrhea.     Plan:    Continue to Glycerin supp as needed. Use LInzess prn. Colonoscopy offered today but she declined. She would like to wait till next year. Increase fiber in diet.

## 2012-01-10 NOTE — Patient Instructions (Addendum)
Continue Glycerin supp.. Increase fiber in diet. Patient offered colonoscopy today. She would like to wait till 2014 before scheduling.

## 2012-02-28 DIAGNOSIS — I4891 Unspecified atrial fibrillation: Secondary | ICD-10-CM | POA: Diagnosis not present

## 2012-02-28 DIAGNOSIS — I495 Sick sinus syndrome: Secondary | ICD-10-CM | POA: Diagnosis not present

## 2012-03-02 DIAGNOSIS — Z961 Presence of intraocular lens: Secondary | ICD-10-CM | POA: Diagnosis not present

## 2012-03-02 DIAGNOSIS — H5200 Hypermetropia, unspecified eye: Secondary | ICD-10-CM | POA: Diagnosis not present

## 2012-03-02 DIAGNOSIS — H02109 Unspecified ectropion of unspecified eye, unspecified eyelid: Secondary | ICD-10-CM | POA: Diagnosis not present

## 2012-03-02 DIAGNOSIS — H04129 Dry eye syndrome of unspecified lacrimal gland: Secondary | ICD-10-CM | POA: Diagnosis not present

## 2012-05-26 ENCOUNTER — Other Ambulatory Visit: Payer: Self-pay | Admitting: Cardiovascular Disease

## 2012-05-29 ENCOUNTER — Other Ambulatory Visit (HOSPITAL_COMMUNITY): Payer: Self-pay | Admitting: Family Medicine

## 2012-05-29 DIAGNOSIS — E785 Hyperlipidemia, unspecified: Secondary | ICD-10-CM | POA: Diagnosis not present

## 2012-05-29 DIAGNOSIS — Z950 Presence of cardiac pacemaker: Secondary | ICD-10-CM | POA: Diagnosis not present

## 2012-05-29 DIAGNOSIS — Z139 Encounter for screening, unspecified: Secondary | ICD-10-CM

## 2012-05-29 DIAGNOSIS — IMO0002 Reserved for concepts with insufficient information to code with codable children: Secondary | ICD-10-CM | POA: Diagnosis not present

## 2012-05-29 DIAGNOSIS — E039 Hypothyroidism, unspecified: Secondary | ICD-10-CM | POA: Diagnosis not present

## 2012-06-04 ENCOUNTER — Ambulatory Visit (HOSPITAL_COMMUNITY)
Admission: RE | Admit: 2012-06-04 | Discharge: 2012-06-04 | Disposition: A | Discharge status: Home / Self Care | Payer: Medicare Other | Source: Ambulatory Visit | Attending: Family Medicine | Admitting: Family Medicine

## 2012-06-04 DIAGNOSIS — Z139 Encounter for screening, unspecified: Secondary | ICD-10-CM

## 2012-06-04 DIAGNOSIS — Z1231 Encounter for screening mammogram for malignant neoplasm of breast: Secondary | ICD-10-CM | POA: Insufficient documentation

## 2012-06-14 ENCOUNTER — Encounter: Payer: Self-pay | Admitting: *Deleted

## 2012-06-14 LAB — REMOTE PACEMAKER DEVICE
AL AMPLITUDE: 2.8 mv
AL IMPEDENCE PM: 436 Ohm
BATTERY VOLTAGE: 2.8 V
RV LEAD AMPLITUDE: 11.2 mv

## 2012-07-03 ENCOUNTER — Other Ambulatory Visit: Payer: Self-pay | Admitting: Cardiology

## 2012-07-16 DIAGNOSIS — L820 Inflamed seborrheic keratosis: Secondary | ICD-10-CM | POA: Diagnosis not present

## 2012-07-16 DIAGNOSIS — L570 Actinic keratosis: Secondary | ICD-10-CM | POA: Diagnosis not present

## 2012-07-16 DIAGNOSIS — D235 Other benign neoplasm of skin of trunk: Secondary | ICD-10-CM | POA: Diagnosis not present

## 2012-07-22 ENCOUNTER — Encounter: Payer: Self-pay | Admitting: *Deleted

## 2012-08-08 ENCOUNTER — Encounter: Payer: Self-pay | Admitting: Cardiovascular Disease

## 2012-08-09 ENCOUNTER — Other Ambulatory Visit: Payer: Self-pay | Admitting: Cardiovascular Disease

## 2012-08-09 ENCOUNTER — Ambulatory Visit (INDEPENDENT_AMBULATORY_CARE_PROVIDER_SITE_OTHER): Payer: Medicare Other | Admitting: Cardiovascular Disease

## 2012-08-09 ENCOUNTER — Encounter: Payer: Self-pay | Admitting: Cardiovascular Disease

## 2012-08-09 VITALS — BP 124/68 | HR 60 | Ht 66.0 in | Wt 147.1 lb

## 2012-08-09 DIAGNOSIS — I480 Paroxysmal atrial fibrillation: Secondary | ICD-10-CM

## 2012-08-09 DIAGNOSIS — I4891 Unspecified atrial fibrillation: Secondary | ICD-10-CM

## 2012-08-09 DIAGNOSIS — Z950 Presence of cardiac pacemaker: Secondary | ICD-10-CM

## 2012-08-09 DIAGNOSIS — E7800 Pure hypercholesterolemia, unspecified: Secondary | ICD-10-CM | POA: Diagnosis not present

## 2012-08-09 DIAGNOSIS — I359 Nonrheumatic aortic valve disorder, unspecified: Secondary | ICD-10-CM

## 2012-08-09 DIAGNOSIS — Z79899 Other long term (current) drug therapy: Secondary | ICD-10-CM

## 2012-08-09 DIAGNOSIS — I351 Nonrheumatic aortic (valve) insufficiency: Secondary | ICD-10-CM

## 2012-08-09 LAB — PACEMAKER DEVICE OBSERVATION

## 2012-08-09 MED ORDER — METOPROLOL TARTRATE 25 MG PO TABS: ORAL_TABLET | ORAL | Status: DC

## 2012-08-09 NOTE — Patient Instructions (Addendum)
Your physician recommends that you schedule a follow-up appointment in: 1 year. Continue remote pacemaker downloads every 3 months, the next is due October 17th, 2014. Your physician recommends that you return for lab work when fasting. Your physician has recommended you make the following change in your medication: reduce metoprolol to half a tablet once a day (12.5 mg once a day).

## 2012-08-10 DIAGNOSIS — I351 Nonrheumatic aortic (valve) insufficiency: Secondary | ICD-10-CM | POA: Insufficient documentation

## 2012-08-10 DIAGNOSIS — Z950 Presence of cardiac pacemaker: Secondary | ICD-10-CM | POA: Insufficient documentation

## 2012-08-10 NOTE — Assessment & Plan Note (Signed)
No recurrences have been noted since her last pacemaker check. She has only borderline indications for warfarin anticoagulation, based primarily on her age. She does not have systemic hypertension, diabetes, congestive heart failure or a history of previous embolic event. Only one episode of paroxysmal atrial fibrillation has been documented. We'll plan to continue aspirin anticoagulations but is high prevalence of atrial fibrillation is noted on her pacemaker checks we might reconsider.

## 2012-08-10 NOTE — Assessment & Plan Note (Signed)
She is on highly active statin therapy and her most recent lipid profile showed parameters within the desirable range (cholesterol 163, triglycerides 105, HDL 43, LDL 99.)

## 2012-08-10 NOTE — Assessment & Plan Note (Signed)
Mild-to-moderate without evidence of rapid change by serial echocardiography. This is unlikely to be clinically relevant and is unlikely to be responsible for her atrial fibrillation.

## 2012-08-10 NOTE — Progress Notes (Signed)
Patient ID: Abigail Taylor, female   DOB: [birth year], 79 y.o.   MRN: [id number]     Reason for office visit Atrial fibrillation, sinus node dysfunction, pacemaker check  [name] is a 79 year old woman with a single documented episode of paroxysmal atrial fibrillation that terminated with asymptomatic pauses of almost 7 seconds in duration. She received a pacemaker for this roughly a year ago. 79 y.o. She returns for pacemaker check and clinical followup. 79 y.o. Chest not had any palpitations to suggest recurrence of atrial fibrillation and her device does not show any evidence of atrial arrhythmia. Other than her age she does not have features of high risk for embolic events. She has minimal evidence of structural heart disease with mild to moderate aortic insufficiency there is asymptomatic and has not progressed on serial echoes. She has treated hypercholesterolemia and hypothyroidism  Her major complaint is of lack of energy and fatigue as well as lots of arthralgias.    Allergies  Allergen Reactions  . Aleve (Naproxen Sodium)     Per pt abnormal kidney function  . Ibuprofen     Per pt abnormal kidney function    Current Outpatient Prescriptions  Medication Sig Dispense Refill  . aspirin 325 MG EC tablet Take 325 mg by mouth daily.      Marland Kitchen levothyroxine (SYNTHROID, LEVOTHROID) 50 MCG tablet Take 50 mcg by mouth daily.      . metoprolol tartrate (LOPRESSOR) 25 MG tablet Take half a tablet (12.5 mg) once daily  60 tablet  1  . omeprazole (PRILOSEC) 20 MG capsule Take 20 mg by mouth daily.      . rosuvastatin (CRESTOR) 10 MG tablet Take 10 mg by mouth at bedtime.       No current facility-administered medications for this visit.    Past Medical History  Diagnosis Date  . Hypothyroidism   . Hyperlipidemia   . Angina at rest, with the tachycardia 07/05/2011  . Aortic insufficiency 07/05/2011  . Constipation 12/27/2011  . Atrial fibrillation   . Brady-tachy syndrome     Dual chamber Medtronic  pacemaker Adapta    Past Surgical History  Procedure Laterality Date  . Permanent pacemaker insertion  07/05/2011    Medtronic Adapta dual chamber  . Cardiac catheterization  05/26/1999    normal  . Nm myoview ltd  07/19/2011    normal    No family history on file.  History   Social History  . Marital Status: Married    Spouse Name: N/A    Number of Children: N/A  . Years of Education: N/A   Occupational History  . Not on file.   Social History Main Topics  . Smoking status: Never Smoker   . Smokeless tobacco: Never Used  . Alcohol Use: No  . Drug Use: No  . Sexually Active: Not on file   Other Topics Concern  . Not on file   Social History Narrative  . No narrative on file    Review of systems: The patient specifically denies any chest pain at rest or with exertion, dyspnea at rest or with exertion, orthopnea, paroxysmal nocturnal dyspnea, syncope, palpitations, focal neurological deficits, intermittent claudication, lower extremity edema, unexplained weight gain, cough, hemoptysis or wheezing.  The patient also denies abdominal pain, nausea, vomiting, dysphagia, diarrhea, constipation, polyuria, polydipsia, dysuria, hematuria, frequency, urgency, abnormal bleeding or bruising, fever, chills, unexpected weight changes, mood swings, change in skin or hair texture, change in voice quality, auditory or visual problems, allergic reactions or rashes.  PHYSICAL EXAM BP 124/68  Pulse 60  Ht 5\' 6"  (1.676 m)  Wt 147 lb 1.6 oz (66.724 kg)  BMI 23.75 kg/m2  General: Alert, oriented x3, no distress Head: no evidence of trauma, PERRL, EOMI, no exophtalmos or lid lag, no myxedema, no xanthelasma; normal ears, nose and oropharynx Neck: normal jugular venous pulsations and no hepatojugular reflux; brisk carotid pulses without delay and no carotid bruits Chest: clear to auscultation, no signs of consolidation by percussion or palpation, normal fremitus, symmetrical and full  respiratory excursions, healthy left subclavian pacemaker site Cardiovascular: normal position and quality of the apical impulse, regular rhythm, normal first and second heart sounds, no murmurs, rubs or gallops Abdomen: no tenderness or distention, no masses by palpation, no abnormal pulsatility or arterial bruits, normal bowel sounds, no hepatosplenomegaly Extremities: no clubbing, cyanosis or edema; 2+ radial, ulnar and brachial pulses bilaterally; 2+ right femoral, posterior tibial and dorsalis pedis pulses; 2+ left femoral, posterior tibial and dorsalis pedis pulses; no subclavian or femoral bruits Neurological: grossly nonfocal   EKG: Atrial paced ventricular sensed rhythm right bundle branch block nonspecific inferior wall Q waves  Lipid Panel  No results found for this basename: chol, trig, hdl, cholhdl, vldl, ldlcalc    BMET    Component Value Date/Time   NA 144 07/05/2011 0515   K 3.8 07/05/2011 0515   CL 111 07/05/2011 0515   CO2 24 07/05/2011 0515   GLUCOSE 103* 07/05/2011 0515   BUN 18 07/05/2011 0515   CREATININE 0.79 07/05/2011 0515   CALCIUM 8.8 07/05/2011 0515   GFRNONAA 78* 07/05/2011 0515   GFRAA >90 07/05/2011 0515     ASSESSMENT AND PLAN Paroxysmal atrial fibrillation No recurrences have been noted since her last pacemaker check. She has only borderline indications for warfarin anticoagulation, based primarily on her age (79). She does not have systemic hypertension, diabetes, congestive heart failure or a history of previous embolic event. Only one episode of paroxysmal atrial fibrillation has been documented. We'll plan to continue aspirin anticoagulations but is high prevalence of atrial fibrillation is noted on her pacemaker checks we might reconsider.  Hypercholesterolemia She is on highly active statin therapy and her most recent lipid profile showed parameters within the desirable range (cholesterol 163, triglycerides 105, HDL 43, LDL 99.)  Aortic  insufficiency Mild-to-moderate without evidence of rapid change by serial echocardiography. This is unlikely to be clinically relevant and is unlikely to be responsible for her atrial fibrillation.  Orders Placed This Encounter  Procedures  . Comp Met (CMET)  . Lipid Profile  . EKG 12-Lead   Meds ordered this encounter  Medications  . metoprolol tartrate (LOPRESSOR) 25 MG tablet    Sig: Take half a tablet (12.5 mg) once daily    Dispense:  60 tablet    Refill:  1    Abigail Taylor  Thurmon Fair, MD, [facility] [phone] office [phone] pager

## 2012-08-14 DIAGNOSIS — E7800 Pure hypercholesterolemia, unspecified: Secondary | ICD-10-CM | POA: Diagnosis not present

## 2012-08-14 DIAGNOSIS — Z79899 Other long term (current) drug therapy: Secondary | ICD-10-CM | POA: Diagnosis not present

## 2012-08-14 LAB — COMPREHENSIVE METABOLIC PANEL
BUN: 17 mg/dL (ref 6–23)
CO2: 27 mEq/L (ref 19–32)
Creat: 0.84 mg/dL (ref 0.50–1.10)
Glucose, Bld: 101 mg/dL — ABNORMAL HIGH (ref 70–99)
Total Bilirubin: 0.7 mg/dL (ref 0.3–1.2)

## 2012-08-14 LAB — LIPID PANEL
Cholesterol: 140 mg/dL (ref 0–200)
HDL: 40 mg/dL (ref >39)
Total CHOL/HDL Ratio: 3.5 Ratio
Triglycerides: 161 mg/dL — ABNORMAL HIGH (ref <150)
VLDL: 32 mg/dL (ref 0–40)

## 2012-08-16 LAB — PACEMAKER DEVICE OBSERVATION
AL AMPLITUDE: 2.8 mv
BAMS-0001: 150 {beats}/min
BATTERY VOLTAGE: 2.8 V
RV LEAD IMPEDENCE PM: 626 Ohm
VENTRICULAR PACING PM: 0.2

## 2012-09-18 ENCOUNTER — Encounter (INDEPENDENT_AMBULATORY_CARE_PROVIDER_SITE_OTHER): Payer: Self-pay | Admitting: *Deleted

## 2012-09-19 ENCOUNTER — Encounter (INDEPENDENT_AMBULATORY_CARE_PROVIDER_SITE_OTHER): Payer: Self-pay

## 2012-10-09 ENCOUNTER — Telehealth (INDEPENDENT_AMBULATORY_CARE_PROVIDER_SITE_OTHER): Payer: Self-pay | Admitting: *Deleted

## 2012-10-09 ENCOUNTER — Other Ambulatory Visit (INDEPENDENT_AMBULATORY_CARE_PROVIDER_SITE_OTHER): Payer: Self-pay | Admitting: *Deleted

## 2012-10-09 DIAGNOSIS — Z1211 Encounter for screening for malignant neoplasm of colon: Secondary | ICD-10-CM

## 2012-10-09 DIAGNOSIS — Z800 Family history of malignant neoplasm of digestive organs: Secondary | ICD-10-CM

## 2012-10-09 NOTE — Telephone Encounter (Signed)
Patient needs movi prep -- nkda 

## 2012-10-10 MED ORDER — PEG-KCL-NACL-NASULF-NA ASC-C 100 G PO SOLR: 1.0000 | Freq: Once | ORAL | Status: DC

## 2012-10-16 DIAGNOSIS — H43399 Other vitreous opacities, unspecified eye: Secondary | ICD-10-CM | POA: Diagnosis not present

## 2012-10-16 DIAGNOSIS — H26499 Other secondary cataract, unspecified eye: Secondary | ICD-10-CM | POA: Diagnosis not present

## 2012-10-24 ENCOUNTER — Telehealth (INDEPENDENT_AMBULATORY_CARE_PROVIDER_SITE_OTHER): Payer: Self-pay | Admitting: *Deleted

## 2012-10-24 NOTE — Telephone Encounter (Signed)
  Procedure: tcs  Reason/Indication:  fam hx colon ca  Has patient had this procedure before?  Yes, 2009 (scanned)  If so, when, by whom and where?    Is there a family history of colon cancer?  Yes, mother  Who?  What age when diagnosed?    Is patient diabetic?   no      Does patient have prosthetic heart valve?  no  Do you have a pacemaker?  yes  Has patient ever had endocarditis? no  Has patient had joint replacement within last 12 months?  no  Does patient tend to be constipated or take laxatives?   Is patient on Coumadin, Plavix and/or Aspirin? yes  Medications: asa 325 mg daily, crestor 10 mg daily, levothyroxine 50 mg daily, mteoprolol 25 mg 1/1 tab daily, prilosec otc daily  Allergies: nkda  Medication Adjustment: asa 2 days  Procedure date & time: 11/15/12 at 1030

## 2012-10-25 NOTE — Telephone Encounter (Signed)
agree

## 2012-10-29 DIAGNOSIS — Z23 Encounter for immunization: Secondary | ICD-10-CM | POA: Diagnosis not present

## 2012-10-30 ENCOUNTER — Ambulatory Visit (INDEPENDENT_AMBULATORY_CARE_PROVIDER_SITE_OTHER): Payer: Medicare Other | Admitting: *Deleted

## 2012-10-30 ENCOUNTER — Other Ambulatory Visit: Payer: Self-pay | Admitting: Cardiovascular Disease

## 2012-10-30 DIAGNOSIS — I480 Paroxysmal atrial fibrillation: Secondary | ICD-10-CM

## 2012-10-30 DIAGNOSIS — I455 Other specified heart block: Secondary | ICD-10-CM | POA: Diagnosis not present

## 2012-10-30 DIAGNOSIS — I4891 Unspecified atrial fibrillation: Secondary | ICD-10-CM

## 2012-10-30 LAB — PACEMAKER DEVICE OBSERVATION

## 2012-10-31 ENCOUNTER — Encounter (HOSPITAL_COMMUNITY): Payer: Self-pay | Admitting: Pharmacy Technician

## 2012-11-02 ENCOUNTER — Encounter: Payer: Self-pay | Admitting: *Deleted

## 2012-11-02 LAB — PACEMAKER DEVICE OBSERVATION
AL THRESHOLD: 0.5 V
ATRIAL PACING PM: 45.6
RV LEAD AMPLITUDE: 16 mv
RV LEAD THRESHOLD: 0.625 V

## 2012-11-15 ENCOUNTER — Encounter (HOSPITAL_COMMUNITY): Payer: Self-pay | Admitting: *Deleted

## 2012-11-15 ENCOUNTER — Ambulatory Visit (HOSPITAL_COMMUNITY)
Admission: RE | Admit: 2012-11-15 | Discharge: 2012-11-15 | Disposition: A | Discharge status: Home Health | Payer: Medicare Other | Source: Ambulatory Visit | Attending: Internal Medicine | Admitting: Internal Medicine

## 2012-11-15 ENCOUNTER — Encounter (HOSPITAL_COMMUNITY)
Admission: RE | Disposition: A | Discharge status: Home Health | Payer: Self-pay | Source: Ambulatory Visit | Attending: Internal Medicine

## 2012-11-15 DIAGNOSIS — K5730 Diverticulosis of large intestine without perforation or abscess without bleeding: Secondary | ICD-10-CM | POA: Insufficient documentation

## 2012-11-15 DIAGNOSIS — K644 Residual hemorrhoidal skin tags: Secondary | ICD-10-CM | POA: Diagnosis not present

## 2012-11-15 DIAGNOSIS — Z800 Family history of malignant neoplasm of digestive organs: Secondary | ICD-10-CM

## 2012-11-15 DIAGNOSIS — Z1211 Encounter for screening for malignant neoplasm of colon: Secondary | ICD-10-CM | POA: Diagnosis not present

## 2012-11-15 HISTORY — DX: Presence of cardiac pacemaker: Z95.0

## 2012-11-15 HISTORY — PX: COLONOSCOPY: SHX5424

## 2012-11-15 SURGERY — COLONOSCOPY
Anesthesia: Moderate Sedation

## 2012-11-15 MED ORDER — MIDAZOLAM HCL 5 MG/5ML IJ SOLN
INTRAMUSCULAR | Status: DC | PRN
  Administered 2012-11-15: 1 mg via INTRAVENOUS
  Administered 2012-11-15: 2 mg via INTRAVENOUS

## 2012-11-15 MED ORDER — MEPERIDINE HCL 50 MG/ML IJ SOLN
INTRAMUSCULAR | Status: AC
  Filled 2012-11-15: qty 1

## 2012-11-15 MED ORDER — MIDAZOLAM HCL 5 MG/5ML IJ SOLN
INTRAMUSCULAR | Status: AC
  Filled 2012-11-15: qty 10

## 2012-11-15 MED ORDER — STERILE WATER FOR IRRIGATION IR SOLN
Status: DC | PRN
  Administered 2012-11-15: 10:00:00

## 2012-11-15 MED ORDER — SODIUM CHLORIDE 0.9 % IV SOLN
INTRAVENOUS | Status: DC
  Administered 2012-11-15: 10:00:00 via INTRAVENOUS

## 2012-11-15 MED ORDER — MEPERIDINE HCL 50 MG/ML IJ SOLN
INTRAMUSCULAR | Status: DC | PRN
  Administered 2012-11-15: 25 mg via INTRAVENOUS

## 2012-11-15 NOTE — Op Note (Signed)
Patient states she is not allergic to ibuprofen and that she still takes it prn for arthritis pain.

## 2012-11-15 NOTE — H&P (Signed)
Abigail Taylor is an 79 y.o. female.   Chief Complaint: Patient is here for colonoscopy. HPI: Patient is 79 year old Caucasian female who is screening colonoscopy. Her last exam was 5 years ago. Family history significant for colon carcinoma in her mother at age 61. She denies diarrhea constipation rectal bleeding she does complain of hypogastric pain when she tries to urinate and/or a bowel movement. She is using OTC suppository daily.  Past Medical History  Diagnosis Date  . Hypothyroidism   . Hyperlipidemia   . Angina at rest, with the tachycardia 07/05/2011  . Aortic insufficiency 07/05/2011  . Constipation 12/27/2011  . Atrial fibrillation   . Brady-tachy syndrome     Dual chamber Medtronic pacemaker Adapta  . Pacemaker     Past Surgical History  Procedure Laterality Date  . Permanent pacemaker insertion  07/05/2011    Medtronic Adapta dual chamber  . Cardiac catheterization  05/26/1999    normal  . Nm myoview ltd  07/19/2011    normal    Family History  Problem Relation Age of Onset  . Colon cancer Mother 5   Social History:  reports that she has never smoked. She has never used smokeless tobacco. She reports that she does not drink alcohol or use illicit drugs.  Allergies:  Allergies  Allergen Reactions  . Aleve [Naproxen Sodium]     Per pt abnormal kidney function  . Ibuprofen     Per pt abnormal kidney function    Medications Prior to Admission  Medication Sig Dispense Refill  . aspirin 325 MG EC tablet Take 325 mg by mouth daily.      Marland Kitchen levothyroxine (SYNTHROID, LEVOTHROID) 50 MCG tablet Take 50 mcg by mouth daily.      . metoprolol tartrate (LOPRESSOR) 25 MG tablet Take half a tablet (12.5 mg) once daily  60 tablet  1  . omeprazole (PRILOSEC) 20 MG capsule Take 20 mg by mouth daily.      . peg 3350 powder (MOVIPREP) 100 G SOLR Take 1 kit (200 g total) by mouth once.  1 kit  0  . rosuvastatin (CRESTOR) 10 MG tablet Take 10 mg by mouth at bedtime.        No  results found for this or any previous visit (from the past 48 hour(s)). No results found.  ROS  Blood pressure 172/85, pulse 70, temperature 97.8 F (36.6 C), temperature source Oral, resp. rate 17, height 5\' 6"  (1.676 m), weight 145 lb (65.772 kg), SpO2 98.00%. Physical Exam  Constitutional: She appears well-developed and well-nourished.  HENT:  Mouth/Throat: Oropharynx is clear and moist.  Eyes: Conjunctivae are normal. No scleral icterus.  Neck: No thyromegaly present.  Cardiovascular: Normal rate, regular rhythm and normal heart sounds.   No murmur heard. Respiratory: Effort normal and breath sounds normal.  GI: Soft. She exhibits no distension and no mass. There is no tenderness.  Musculoskeletal: She exhibits no edema.  Lymphadenopathy:    She has no cervical adenopathy.  Neurological: She is alert.  Skin: Skin is warm and dry.     Assessment/Plan Screening colonoscopy. Family history of colon carcinoma.  Abigail Taylor U 11/15/2012, 10:16 AM

## 2012-11-15 NOTE — Op Note (Signed)
COLONOSCOPY PROCEDURE REPORT  PATIENT:  Abigail Taylor  MR#:  [id number] Birthdate:  [birth year], 79 y.o., female Endoscopist:  Dr. Malissa Hippo, MD Referred By:  Dr. Alric Ran, MD  Procedure Date: 11/15/2012  Procedure:   Colonoscopy  Indications:  Patient is 79 year old Caucasian female who is here dramatically for screening colonoscopy because of family history of colon carcinoma in her mother. She does complain of hypogastric discomfort at the time of defecation or when she empties her bladder.  Informed Consent:  The procedure and risks were reviewed with the patient and informed consent was obtained.  Medications:  Demerol 25 mg IV Versed 3 mg IV  Description of procedure:  After a digital rectal exam was performed, that colonoscope was advanced from the anus through the rectum and colon to the area of the cecum, ileocecal valve and appendiceal orifice. The cecum was deeply intubated. These structures were well-seen and photographed for the record. From the level of the cecum and ileocecal valve, the scope was slowly and cautiously withdrawn. The mucosal surfaces were carefully surveyed utilizing scope tip to flexion to facilitate fold flattening as needed. The scope was pulled down into the rectum where a thorough exam including retroflexion was performed.  Findings:   Prep excellent. Moderate number of diverticula noted at sigmoid colon. Normal rectal mucosa. Small hemorrhoids below the dentate line and prominent sentinel skin tags.   Therapeutic/Diagnostic Maneuvers Performed:  None  Complications:  None  Cecal Withdrawal Time:  8 minutes  Impression:  Examination performed to cecum. Moderate sigmoid colon diverticulosis. External hemorrhoids and sentinel skin tags.  Recommendations:  Standard instructions given. High-fiber diet. Patient advised to schedule appointment with Dr. Regino Schultze or her gynecologist to rule out rectocele and  cystocele.  REHMAN,NAJEEB U  11/15/2012 10:43 AM  CC: Dr. Kirk Ruths, MD & Dr. Bonnetta Barry ref. provider found

## 2012-11-20 ENCOUNTER — Encounter (HOSPITAL_COMMUNITY): Payer: Self-pay | Admitting: Internal Medicine

## 2013-01-09 DIAGNOSIS — IMO0002 Reserved for concepts with insufficient information to code with codable children: Secondary | ICD-10-CM | POA: Diagnosis not present

## 2013-01-09 DIAGNOSIS — E039 Hypothyroidism, unspecified: Secondary | ICD-10-CM | POA: Diagnosis not present

## 2013-01-09 DIAGNOSIS — Z950 Presence of cardiac pacemaker: Secondary | ICD-10-CM | POA: Diagnosis not present

## 2013-01-09 DIAGNOSIS — E785 Hyperlipidemia, unspecified: Secondary | ICD-10-CM | POA: Diagnosis not present

## 2013-01-31 ENCOUNTER — Ambulatory Visit (INDEPENDENT_AMBULATORY_CARE_PROVIDER_SITE_OTHER): Payer: Medicare Other | Admitting: *Deleted

## 2013-01-31 DIAGNOSIS — I480 Paroxysmal atrial fibrillation: Secondary | ICD-10-CM

## 2013-01-31 DIAGNOSIS — I4891 Unspecified atrial fibrillation: Secondary | ICD-10-CM

## 2013-01-31 DIAGNOSIS — I455 Other specified heart block: Secondary | ICD-10-CM | POA: Diagnosis not present

## 2013-01-31 LAB — PACEMAKER DEVICE OBSERVATION

## 2013-02-07 DIAGNOSIS — I70209 Unspecified atherosclerosis of native arteries of extremities, unspecified extremity: Secondary | ICD-10-CM | POA: Diagnosis not present

## 2013-02-10 ENCOUNTER — Encounter: Payer: Self-pay | Admitting: *Deleted

## 2013-04-18 DIAGNOSIS — E1149 Type 2 diabetes mellitus with other diabetic neurological complication: Secondary | ICD-10-CM | POA: Diagnosis not present

## 2013-04-18 DIAGNOSIS — E119 Type 2 diabetes mellitus without complications: Secondary | ICD-10-CM | POA: Diagnosis not present

## 2013-05-06 ENCOUNTER — Encounter: Payer: Medicare Other | Admitting: *Deleted

## 2013-05-29 ENCOUNTER — Encounter (INDEPENDENT_AMBULATORY_CARE_PROVIDER_SITE_OTHER): Payer: Self-pay | Admitting: Internal Medicine

## 2013-05-29 ENCOUNTER — Ambulatory Visit (INDEPENDENT_AMBULATORY_CARE_PROVIDER_SITE_OTHER): Payer: Medicare Other | Admitting: Internal Medicine

## 2013-05-29 VITALS — BP 94/45 | HR 66 | Temp 97.9°F | Ht 66.0 in | Wt 144.5 lb

## 2013-05-29 DIAGNOSIS — K5900 Constipation, unspecified: Secondary | ICD-10-CM

## 2013-05-29 NOTE — Progress Notes (Signed)
Subjective:     Patient ID: Abigail Taylor, female   DOB: April 27, 1931, 80 y.o.   MRN: [id number]  HPI Here today for f/u. She feels good. Sometimes she feels like she doesn't have any energy. Appetite has been good. No weight loss. She usually has a BM daily and uses an glycerin supp daily (gives her the urge to have a BM). No melena or bright red rectal bleeding.  11/15/2012 Colonoscopy: Family hx of colon cancer (mother) Impression:  Examination performed to cecum.  Moderate sigmoid colon diverticulosis.  External hemorrhoids and sentinel skin tags.  Review of Systems see hpi   Past Medical History  Diagnosis Date  . Hypothyroidism   . Hyperlipidemia   . Angina at rest, with the tachycardia 07/05/2011  . Aortic insufficiency 07/05/2011  . Constipation 12/27/2011  . Atrial fibrillation   . Brady-tachy syndrome     Dual chamber Medtronic pacemaker Adapta  . Pacemaker     Past Surgical History  Procedure Laterality Date  . Permanent pacemaker insertion  07/05/2011    Medtronic Adapta dual chamber  . Cardiac catheterization  05/26/1999    normal  . Nm myoview ltd  07/19/2011    normal  . Colonoscopy N/A 11/15/2012    Procedure: COLONOSCOPY;  Surgeon: [name], MD;  Location: [facility];  Service: Endoscopy;  Laterality: N/A;  1030    Allergies  Allergen Reactions  . Aleve [Naproxen Sodium]     Per pt abnormal kidney function  . Ibuprofen     Per pt abnormal kidney function    Current Outpatient Prescriptions on File Prior to Visit  Medication Sig Dispense Refill  . aspirin 325 MG EC tablet Take 325 mg by mouth daily.      Marland Kitchen levothyroxine (SYNTHROID, LEVOTHROID) 50 MCG tablet Take 50 mcg by mouth daily.      . rosuvastatin (CRESTOR) 10 MG tablet Take 10 mg by mouth at bedtime.       No current facility-administered medications on file prior to visit.        Objective:   Physical Exam Filed Vitals:   05/29/13 1504  BP: 94/45  Pulse: 66  Temp: 97.9 F  (36.6 C)  Height: 5\' 6"  (1.676 m)  Weight: 144 lb 8 oz (65.545 kg)   Alert and oriented. Skin warm and dry. Oral mucosa is moist.   . Sclera anicteric, conjunctivae is pink. Thyroid not enlarged. No cervical lymphadenopathy. Lungs clear. Heart regular rate and rhythm.  Abdomen is soft. Bowel sounds are positive. No hepatomegaly. No abdominal masses felt. No tenderness.  No edema to lower extremities.       Assessment:     Constipation. Usually uses a glycerin supp for her constipation. Usually has a BM daily    Plan:     OV in 1 yr. Samples of Linzess 119mcg # 6 given to patient.

## 2013-05-29 NOTE — Patient Instructions (Signed)
OV in 1 yr. Samples of Linzess 182mcg x 6 boxes given to patient.

## 2013-06-27 DIAGNOSIS — E119 Type 2 diabetes mellitus without complications: Secondary | ICD-10-CM | POA: Diagnosis not present

## 2013-06-27 DIAGNOSIS — E1149 Type 2 diabetes mellitus with other diabetic neurological complication: Secondary | ICD-10-CM | POA: Diagnosis not present

## 2013-08-19 DIAGNOSIS — IMO0002 Reserved for concepts with insufficient information to code with codable children: Secondary | ICD-10-CM | POA: Diagnosis not present

## 2013-08-19 DIAGNOSIS — E039 Hypothyroidism, unspecified: Secondary | ICD-10-CM | POA: Diagnosis not present

## 2013-08-19 DIAGNOSIS — E785 Hyperlipidemia, unspecified: Secondary | ICD-10-CM | POA: Diagnosis not present

## 2013-08-27 ENCOUNTER — Ambulatory Visit (INDEPENDENT_AMBULATORY_CARE_PROVIDER_SITE_OTHER): Payer: Medicare Other | Admitting: Cardiovascular Disease

## 2013-08-27 ENCOUNTER — Encounter: Payer: Self-pay | Admitting: Cardiovascular Disease

## 2013-08-27 VITALS — BP 146/72 | HR 71 | Resp 16 | Ht 66.0 in | Wt 139.8 lb

## 2013-08-27 DIAGNOSIS — I455 Other specified heart block: Secondary | ICD-10-CM

## 2013-08-27 DIAGNOSIS — I480 Paroxysmal atrial fibrillation: Secondary | ICD-10-CM

## 2013-08-27 DIAGNOSIS — I4891 Unspecified atrial fibrillation: Secondary | ICD-10-CM | POA: Diagnosis not present

## 2013-08-27 DIAGNOSIS — E7800 Pure hypercholesterolemia, unspecified: Secondary | ICD-10-CM

## 2013-08-27 DIAGNOSIS — I495 Sick sinus syndrome: Secondary | ICD-10-CM

## 2013-08-27 DIAGNOSIS — Z950 Presence of cardiac pacemaker: Secondary | ICD-10-CM

## 2013-08-27 LAB — MDC_IDC_ENUM_SESS_TYPE_INCLINIC
Battery Remaining Longevity: 159 mo
Brady Statistic AP VP Percent: 0 %
Brady Statistic AP VS Percent: 37 %
Brady Statistic AS VP Percent: 0 %
Date Time Interrogation Session: 20150804123448
Lead Channel Impedance Value: 461 Ohm
Lead Channel Pacing Threshold Amplitude: 0.5 V
Lead Channel Pacing Threshold Amplitude: 0.5 V
Lead Channel Pacing Threshold Pulse Width: 0.4 ms
Lead Channel Pacing Threshold Pulse Width: 0.4 ms
Lead Channel Sensing Intrinsic Amplitude: 15.67 mV
Lead Channel Sensing Intrinsic Amplitude: 4 mV
Lead Channel Setting Pacing Pulse Width: 0.4 ms
MDC IDC MSMT BATTERY IMPEDANCE: 135 Ohm
MDC IDC MSMT BATTERY VOLTAGE: 2.79 V
MDC IDC MSMT LEADCHNL RV IMPEDANCE VALUE: 609 Ohm
MDC IDC SET LEADCHNL RA PACING AMPLITUDE: 1.5 V
MDC IDC SET LEADCHNL RV PACING AMPLITUDE: 2 V
MDC IDC SET LEADCHNL RV SENSING SENSITIVITY: 5.6 mV
MDC IDC STAT BRADY AS VS PERCENT: 63 %

## 2013-08-27 NOTE — Patient Instructions (Signed)
Remote monitoring is used to monitor your pacemaker from home. This monitoring reduces the number of office visits required to check your device to one time per year. It allows [place] to keep an eye on the functioning of your device to ensure it is working properly. You are scheduled for a device check from home on 11-28-2013. You may send your transmission at any time that day. If you have a wireless device, the transmission will be sent automatically. After your physician reviews your transmission, you will receive a postcard with your next transmission date.  Your physician recommends that you schedule a follow-up appointment in: 12 months with Dr.Croitoru

## 2013-08-27 NOTE — Progress Notes (Signed)
Patient ID: Abigail Taylor, female   DOB: 10-17-1931, 80 y.o.   MRN: [id number]      Reason for office visit Paroxysmal atrial flutter ablation, sinus node dysfunction, pacemaker followup  Abigail Taylor is now 80 years old. She has not had any major physical problems since her last appointment but today almost seems a little depressed. She complains of her age and the fact that she is often tired and "doesn't feel well. She is to rest a lot. She is very conscious of the fact that her husband is 2 years younger in her circle of friends is shrinking as many of her acquaintances are passing away. Nevertheless she remains in gauge thin social activities and does not have the full complement of symptoms of depression.  Her pacemaker checked today shows normal findings. She has roughly 37% atrial pacing and virtually no ventricular pacing. Heart rate histogram distribution is favorable. Lead parameters are excellent. Battery longevity is greater than 11 years. She has not had any meaningful episodes of arrhythmia other than a few clustered PACs. No true atrial fibrillation has been recorded since her index event in 2013. She is on chronic aspirin therapy. She takes a statin for hyperlipidemia. Previous workup has shown minimal evidence of structural heart disease (mild to moderate aortic insufficiency, asymptomatic, normal left ventricular systolic function). She does not have known coronary or other vascular problems. She takes her thyroid hormone supplement.   Allergies  Allergen Reactions  . Aleve [Naproxen Sodium]     Per pt abnormal kidney function  . Ibuprofen     Per pt abnormal kidney function    Current Outpatient Prescriptions  Medication Sig Dispense Refill  . aspirin 325 MG EC tablet Take 325 mg by mouth daily.      Marland Kitchen levothyroxine (SYNTHROID, LEVOTHROID) 50 MCG tablet Take 50 mcg by mouth daily.      . rosuvastatin (CRESTOR) 10 MG tablet Take 10 mg by mouth at bedtime.       No current  facility-administered medications for this visit.    Past Medical History  Diagnosis Date  . Hypothyroidism   . Hyperlipidemia   . Angina at rest, with the tachycardia 07/05/2011  . Aortic insufficiency 07/05/2011  . Constipation 12/27/2011  . Atrial fibrillation   . Brady-tachy syndrome     Dual chamber Medtronic pacemaker Adapta  . Pacemaker     Past Surgical History  Procedure Laterality Date  . Permanent pacemaker insertion  07/05/2011    Medtronic Adapta dual chamber  . Cardiac catheterization  05/26/1999    normal  . Nm myoview ltd  07/19/2011    normal  . Colonoscopy N/A 11/15/2012    Procedure: COLONOSCOPY;  Surgeon: [name], MD;  Location: [facility];  Service: Endoscopy;  Laterality: N/A;  1030    Family History  Problem Relation Age of Onset  . Colon cancer Mother 66    History   Social History  . Marital Status: Married    Spouse Name: N/A    Number of Children: N/A  . Years of Education: N/A   Occupational History  . Not on file.   Social History Main Topics  . Smoking status: Never Smoker   . Smokeless tobacco: Never Used  . Alcohol Use: No  . Drug Use: No  . Sexual Activity: Not on file   Other Topics Concern  . Not on file   Social History Narrative  . No narrative on file    Review  of systems: Fatigue. The patient specifically denies any chest pain at rest or with exertion, dyspnea at rest or with exertion, orthopnea, paroxysmal nocturnal dyspnea, syncope, palpitations, focal neurological deficits, intermittent claudication, lower extremity edema, unexplained weight gain, cough, hemoptysis or wheezing.  The patient also denies abdominal pain, nausea, vomiting, dysphagia, diarrhea, constipation, polyuria, polydipsia, dysuria, hematuria, frequency, urgency, abnormal bleeding or bruising, fever, chills, unexpected weight changes, Taylor swings, change in skin or hair texture, change in voice quality, auditory or visual problems, allergic  reactions or rashes, new musculoskeletal complaints other than usual "aches and pains".   PHYSICAL EXAM BP 146/72  Pulse 71  Resp 16  Ht 5\' 6"  (1.676 m)  Wt 139 lb 12.8 oz (63.413 kg)  BMI 22.58 kg/m2 General: Alert, oriented x3, no distress  Head: no evidence of trauma, PERRL, EOMI, no exophtalmos or lid lag, no myxedema, no xanthelasma; normal ears, nose and oropharynx  Neck: normal jugular venous pulsations and no hepatojugular reflux; brisk carotid pulses without delay and no carotid bruits  Chest: clear to auscultation, no signs of consolidation by percussion or palpation, normal fremitus, symmetrical and full respiratory excursions, healthy left subclavian pacemaker site  Cardiovascular: normal position and quality of the apical impulse, regular rhythm, normal first and second heart sounds, no murmurs, rubs or gallops  Abdomen: no tenderness or distention, no masses by palpation, no abnormal pulsatility or arterial bruits, normal bowel sounds, no hepatosplenomegaly  Extremities: no clubbing, cyanosis or edema; 2+ radial, ulnar and brachial pulses bilaterally; 2+ right femoral, posterior tibial and dorsalis pedis pulses; 2+ left femoral, posterior tibial and dorsalis pedis pulses; no subclavian or femoral bruits  Neurological: grossly nonfocal  EKG: Atrial paced ventricular sensed rhythm right bundle branch block nonspecific inferior wall Q waves   Lipid Panel     Component Value Date/Time   CHOL 140 08/09/2012 1142   TRIG 161* 08/09/2012 1142   HDL 40 08/09/2012 1142   CHOLHDL 3.5 08/09/2012 1142   VLDL 32 08/09/2012 1142   LDLCALC 68 08/09/2012 1142    BMET    Component Value Date/Time   NA 142 08/09/2012 1142   K 4.4 08/09/2012 1142   CL 108 08/09/2012 1142   CO2 27 08/09/2012 1142   GLUCOSE 101* 08/09/2012 1142   BUN 17 08/09/2012 1142   CREATININE 0.84 08/09/2012 1142   CREATININE 0.79 07/05/2011 0515   CALCIUM 9.6 08/09/2012 1142   GFRNONAA 78* 07/05/2011 0515   GFRAA >90  07/05/2011 0515     ASSESSMENT AND PLAN Paroxysmal atrial fibrillation  No recurrences have been noted in over two years. She has only borderline indications for warfarin anticoagulation, based primarily on her age. She does not have systemic hypertension, diabetes, congestive heart failure or a history of previous embolic event. Only one episode of paroxysmal atrial fibrillation has been documented. We'll plan to continue aspirin anticoagulation.  Hypercholesterolemia  Her most recent lipid profile showed parameters within the desirable range   Aortic insufficiency  Mild-to-moderate without evidence of rapid change by serial echocardiography.   Sinus node dysfunction status post dual-chamber permanent pacemaker Normal pacemaker function. Continue remote followup every 3 months, in office checks every year  Patient Instructions  Remote monitoring is used to monitor your pacemaker from home. This monitoring reduces the number of office visits required to check your device to one time per year. It allows [place] to keep an eye on the functioning of your device to ensure it is working properly. You are scheduled for a  device check from home on 11-28-2013. You may send your transmission at any time that day. If you have a wireless device, the transmission will be sent automatically. After your physician reviews your transmission, you will receive a postcard with your next transmission date.  Your physician recommends that you schedule a follow-up appointment in: 12 months with Dr.Symphony Demuro       Orders Placed This Encounter  Procedures  . Implantable device check  . EKG 12-Lead   No orders of the defined types were placed in this encounter.    Holli Humbles, MD, [place] [phone] office [phone] pager

## 2013-09-09 DIAGNOSIS — I739 Peripheral vascular disease, unspecified: Secondary | ICD-10-CM | POA: Diagnosis not present

## 2013-10-07 ENCOUNTER — Encounter: Payer: Self-pay | Admitting: Cardiovascular Disease

## 2013-10-14 DIAGNOSIS — L570 Actinic keratosis: Secondary | ICD-10-CM | POA: Diagnosis not present

## 2013-10-14 DIAGNOSIS — D235 Other benign neoplasm of skin of trunk: Secondary | ICD-10-CM | POA: Diagnosis not present

## 2013-10-14 DIAGNOSIS — L918 Other hypertrophic disorders of the skin: Secondary | ICD-10-CM | POA: Diagnosis not present

## 2013-10-14 DIAGNOSIS — L908 Other atrophic disorders of skin: Secondary | ICD-10-CM | POA: Diagnosis not present

## 2013-10-14 DIAGNOSIS — T1490XA Injury, unspecified, initial encounter: Secondary | ICD-10-CM | POA: Diagnosis not present

## 2013-11-08 DIAGNOSIS — Z23 Encounter for immunization: Secondary | ICD-10-CM | POA: Diagnosis not present

## 2013-11-25 DIAGNOSIS — I739 Peripheral vascular disease, unspecified: Secondary | ICD-10-CM | POA: Diagnosis not present

## 2013-11-28 ENCOUNTER — Telehealth: Payer: Self-pay | Admitting: Cardiology

## 2013-11-28 ENCOUNTER — Encounter: Payer: Self-pay | Admitting: Cardiovascular Disease

## 2013-11-28 ENCOUNTER — Ambulatory Visit (INDEPENDENT_AMBULATORY_CARE_PROVIDER_SITE_OTHER): Payer: Medicare Other | Admitting: *Deleted

## 2013-11-28 DIAGNOSIS — I495 Sick sinus syndrome: Secondary | ICD-10-CM | POA: Diagnosis not present

## 2013-11-28 LAB — MDC_IDC_ENUM_SESS_TYPE_REMOTE
Battery Impedance: 159 Ohm
Battery Remaining Longevity: 153 mo
Battery Voltage: 2.79 V
Brady Statistic AP VS Percent: 32 %
Lead Channel Impedance Value: 448 Ohm
Lead Channel Pacing Threshold Amplitude: 0.625 V
Lead Channel Pacing Threshold Amplitude: 0.75 V
Lead Channel Pacing Threshold Pulse Width: 0.4 ms
Lead Channel Sensing Intrinsic Amplitude: 11.2 mV
Lead Channel Sensing Intrinsic Amplitude: 2.8 mV
Lead Channel Setting Pacing Amplitude: 1.5 V
Lead Channel Setting Pacing Amplitude: 2 V
Lead Channel Setting Pacing Pulse Width: 0.4 ms
MDC IDC MSMT LEADCHNL RV IMPEDANCE VALUE: 659 Ohm
MDC IDC MSMT LEADCHNL RV PACING THRESHOLD PULSEWIDTH: 0.4 ms
MDC IDC SESS DTM: 20151105184748
MDC IDC SET LEADCHNL RV SENSING SENSITIVITY: 5.6 mV
MDC IDC STAT BRADY AP VP PERCENT: 0 %
MDC IDC STAT BRADY AS VP PERCENT: 0 %
MDC IDC STAT BRADY AS VS PERCENT: 68 %

## 2013-11-28 NOTE — Progress Notes (Signed)
Remote pacemaker transmission.   

## 2013-11-28 NOTE — Telephone Encounter (Signed)
Spoke with pt and reminded pt of remote transmission that is due today. Pt verbalized understanding.   

## 2013-12-02 DIAGNOSIS — M545 Low back pain: Secondary | ICD-10-CM | POA: Diagnosis not present

## 2013-12-02 DIAGNOSIS — M25551 Pain in right hip: Secondary | ICD-10-CM | POA: Diagnosis not present

## 2013-12-13 ENCOUNTER — Encounter: Payer: Self-pay | Admitting: Cardiology

## 2014-01-02 ENCOUNTER — Encounter (HOSPITAL_COMMUNITY): Payer: Self-pay | Admitting: Cardiovascular Disease

## 2014-01-11 DIAGNOSIS — J209 Acute bronchitis, unspecified: Secondary | ICD-10-CM | POA: Diagnosis not present

## 2014-01-30 DIAGNOSIS — J180 Bronchopneumonia, unspecified organism: Secondary | ICD-10-CM | POA: Diagnosis not present

## 2014-01-30 DIAGNOSIS — Z6821 Body mass index (BMI) 21.0-21.9, adult: Secondary | ICD-10-CM | POA: Diagnosis not present

## 2014-02-03 DIAGNOSIS — I739 Peripheral vascular disease, unspecified: Secondary | ICD-10-CM | POA: Diagnosis not present

## 2014-02-05 ENCOUNTER — Encounter (INDEPENDENT_AMBULATORY_CARE_PROVIDER_SITE_OTHER): Payer: Self-pay | Admitting: *Deleted

## 2014-02-27 ENCOUNTER — Other Ambulatory Visit: Payer: Self-pay | Admitting: Orthopedic Surgery

## 2014-02-27 DIAGNOSIS — M545 Low back pain, unspecified: Secondary | ICD-10-CM

## 2014-02-27 DIAGNOSIS — M25551 Pain in right hip: Secondary | ICD-10-CM | POA: Diagnosis not present

## 2014-03-03 ENCOUNTER — Encounter: Payer: Self-pay | Admitting: Cardiovascular Disease

## 2014-03-03 ENCOUNTER — Ambulatory Visit (INDEPENDENT_AMBULATORY_CARE_PROVIDER_SITE_OTHER): Payer: Medicare Other | Admitting: *Deleted

## 2014-03-03 DIAGNOSIS — I495 Sick sinus syndrome: Secondary | ICD-10-CM

## 2014-03-03 NOTE — Progress Notes (Signed)
Remote pacemaker transmission.   

## 2014-03-04 ENCOUNTER — Ambulatory Visit
Admission: RE | Admit: 2014-03-04 | Discharge: 2014-03-04 | Disposition: A | Discharge status: Home / Self Care | Payer: Medicare Other | Source: Ambulatory Visit | Attending: Orthopedic Surgery | Admitting: Orthopedic Surgery

## 2014-03-04 DIAGNOSIS — M4806 Spinal stenosis, lumbar region: Secondary | ICD-10-CM | POA: Diagnosis not present

## 2014-03-04 DIAGNOSIS — M545 Low back pain, unspecified: Secondary | ICD-10-CM

## 2014-03-05 LAB — MDC_IDC_ENUM_SESS_TYPE_REMOTE
Battery Impedance: 183 Ohm
Battery Voltage: 2.79 V
Brady Statistic AP VP Percent: 0 %
Brady Statistic AP VS Percent: 30 %
Brady Statistic AS VS Percent: 70 %
Lead Channel Impedance Value: 474 Ohm
Lead Channel Impedance Value: 596 Ohm
Lead Channel Pacing Threshold Amplitude: 0.75 V
Lead Channel Pacing Threshold Pulse Width: 0.4 ms
Lead Channel Pacing Threshold Pulse Width: 0.4 ms
Lead Channel Sensing Intrinsic Amplitude: 2.8 mV
Lead Channel Setting Pacing Amplitude: 1.5 V
Lead Channel Setting Pacing Amplitude: 2 V
Lead Channel Setting Pacing Pulse Width: 0.4 ms
MDC IDC MSMT BATTERY REMAINING LONGEVITY: 148 mo
MDC IDC MSMT LEADCHNL RA PACING THRESHOLD AMPLITUDE: 0.75 V
MDC IDC MSMT LEADCHNL RV SENSING INTR AMPL: 11.2 mV
MDC IDC SESS DTM: 20160208144542
MDC IDC SET LEADCHNL RV SENSING SENSITIVITY: 5.6 mV
MDC IDC STAT BRADY AS VP PERCENT: 0 %

## 2014-03-06 DIAGNOSIS — M25551 Pain in right hip: Secondary | ICD-10-CM | POA: Diagnosis not present

## 2014-03-06 DIAGNOSIS — M545 Low back pain: Secondary | ICD-10-CM | POA: Diagnosis not present

## 2014-03-10 ENCOUNTER — Encounter: Payer: Self-pay | Admitting: Cardiology

## 2014-03-13 DIAGNOSIS — M545 Low back pain: Secondary | ICD-10-CM | POA: Diagnosis not present

## 2014-03-13 DIAGNOSIS — M47816 Spondylosis without myelopathy or radiculopathy, lumbar region: Secondary | ICD-10-CM | POA: Diagnosis not present

## 2014-03-13 DIAGNOSIS — M4806 Spinal stenosis, lumbar region: Secondary | ICD-10-CM | POA: Diagnosis not present

## 2014-03-17 DIAGNOSIS — M4806 Spinal stenosis, lumbar region: Secondary | ICD-10-CM | POA: Diagnosis not present

## 2014-03-17 DIAGNOSIS — M47816 Spondylosis without myelopathy or radiculopathy, lumbar region: Secondary | ICD-10-CM | POA: Diagnosis not present

## 2014-03-17 DIAGNOSIS — M545 Low back pain: Secondary | ICD-10-CM | POA: Diagnosis not present

## 2014-03-31 ENCOUNTER — Other Ambulatory Visit (HOSPITAL_COMMUNITY): Payer: Self-pay | Admitting: Internal Medicine

## 2014-03-31 DIAGNOSIS — G252 Other specified forms of tremor: Secondary | ICD-10-CM | POA: Diagnosis not present

## 2014-03-31 DIAGNOSIS — E063 Autoimmune thyroiditis: Secondary | ICD-10-CM | POA: Diagnosis not present

## 2014-03-31 DIAGNOSIS — Z23 Encounter for immunization: Secondary | ICD-10-CM | POA: Diagnosis not present

## 2014-03-31 DIAGNOSIS — G894 Chronic pain syndrome: Secondary | ICD-10-CM | POA: Diagnosis not present

## 2014-03-31 DIAGNOSIS — Z6820 Body mass index (BMI) 20.0-20.9, adult: Secondary | ICD-10-CM | POA: Diagnosis not present

## 2014-03-31 DIAGNOSIS — E2839 Other primary ovarian failure: Secondary | ICD-10-CM

## 2014-04-01 DIAGNOSIS — M4806 Spinal stenosis, lumbar region: Secondary | ICD-10-CM | POA: Diagnosis not present

## 2014-04-01 DIAGNOSIS — M47816 Spondylosis without myelopathy or radiculopathy, lumbar region: Secondary | ICD-10-CM | POA: Diagnosis not present

## 2014-04-01 DIAGNOSIS — M545 Low back pain: Secondary | ICD-10-CM | POA: Diagnosis not present

## 2014-04-03 ENCOUNTER — Other Ambulatory Visit (HOSPITAL_COMMUNITY): Payer: PRIVATE HEALTH INSURANCE

## 2014-04-03 ENCOUNTER — Ambulatory Visit (HOSPITAL_COMMUNITY)
Admission: RE | Admit: 2014-04-03 | Discharge: 2014-04-03 | Disposition: A | Discharge status: Home / Self Care | Payer: Medicare Other | Source: Ambulatory Visit | Attending: Internal Medicine | Admitting: Internal Medicine

## 2014-04-03 DIAGNOSIS — E2839 Other primary ovarian failure: Secondary | ICD-10-CM | POA: Diagnosis not present

## 2014-04-03 DIAGNOSIS — M8588 Other specified disorders of bone density and structure, other site: Secondary | ICD-10-CM | POA: Diagnosis not present

## 2014-04-09 DIAGNOSIS — M545 Low back pain: Secondary | ICD-10-CM | POA: Diagnosis not present

## 2014-04-09 DIAGNOSIS — M4806 Spinal stenosis, lumbar region: Secondary | ICD-10-CM | POA: Diagnosis not present

## 2014-04-09 DIAGNOSIS — M47816 Spondylosis without myelopathy or radiculopathy, lumbar region: Secondary | ICD-10-CM | POA: Diagnosis not present

## 2014-04-15 DIAGNOSIS — I739 Peripheral vascular disease, unspecified: Secondary | ICD-10-CM | POA: Diagnosis not present

## 2014-05-07 ENCOUNTER — Observation Stay (HOSPITAL_COMMUNITY)
Admission: EM | Admit: 2014-05-07 | Discharge: 2014-05-09 | Disposition: A | Discharge status: Home / Self Care | Payer: Medicare Other | Attending: Cardiovascular Disease | Admitting: Cardiovascular Disease

## 2014-05-07 ENCOUNTER — Emergency Department (HOSPITAL_COMMUNITY): Payer: Medicare Other

## 2014-05-07 ENCOUNTER — Encounter (HOSPITAL_COMMUNITY): Payer: Self-pay | Admitting: Emergency Medicine

## 2014-05-07 DIAGNOSIS — Z8719 Personal history of other diseases of the digestive system: Secondary | ICD-10-CM | POA: Diagnosis not present

## 2014-05-07 DIAGNOSIS — R079 Chest pain, unspecified: Secondary | ICD-10-CM | POA: Diagnosis not present

## 2014-05-07 DIAGNOSIS — Z7982 Long term (current) use of aspirin: Secondary | ICD-10-CM | POA: Diagnosis not present

## 2014-05-07 DIAGNOSIS — Z950 Presence of cardiac pacemaker: Secondary | ICD-10-CM | POA: Insufficient documentation

## 2014-05-07 DIAGNOSIS — I208 Other forms of angina pectoris: Secondary | ICD-10-CM | POA: Diagnosis not present

## 2014-05-07 DIAGNOSIS — E785 Hyperlipidemia, unspecified: Secondary | ICD-10-CM | POA: Diagnosis not present

## 2014-05-07 DIAGNOSIS — I480 Paroxysmal atrial fibrillation: Secondary | ICD-10-CM | POA: Diagnosis present

## 2014-05-07 DIAGNOSIS — I4891 Unspecified atrial fibrillation: Principal | ICD-10-CM | POA: Insufficient documentation

## 2014-05-07 DIAGNOSIS — F419 Anxiety disorder, unspecified: Secondary | ICD-10-CM | POA: Diagnosis not present

## 2014-05-07 DIAGNOSIS — Z79899 Other long term (current) drug therapy: Secondary | ICD-10-CM | POA: Insufficient documentation

## 2014-05-07 DIAGNOSIS — IMO0001 Reserved for inherently not codable concepts without codable children: Secondary | ICD-10-CM

## 2014-05-07 DIAGNOSIS — E039 Hypothyroidism, unspecified: Secondary | ICD-10-CM | POA: Diagnosis present

## 2014-05-07 DIAGNOSIS — I351 Nonrheumatic aortic (valve) insufficiency: Secondary | ICD-10-CM | POA: Diagnosis not present

## 2014-05-07 DIAGNOSIS — I495 Sick sinus syndrome: Secondary | ICD-10-CM | POA: Diagnosis not present

## 2014-05-07 DIAGNOSIS — Z0389 Encounter for observation for other suspected diseases and conditions ruled out: Secondary | ICD-10-CM

## 2014-05-07 DIAGNOSIS — I455 Other specified heart block: Secondary | ICD-10-CM | POA: Diagnosis present

## 2014-05-07 HISTORY — DX: Unspecified osteoarthritis, unspecified site: M19.90

## 2014-05-07 LAB — CBC WITH DIFFERENTIAL/PLATELET
BASOS ABS: 0 10*3/uL (ref 0.0–0.1)
Basophils Relative: 0 % (ref 0–1)
EOS ABS: 0.2 10*3/uL (ref 0.0–0.7)
Eosinophils Relative: 2 % (ref 0–5)
HEMATOCRIT: 39.7 % (ref 36.0–46.0)
Hemoglobin: 13.2 g/dL (ref 12.0–15.0)
Lymphocytes Relative: 15 % (ref 12–46)
Lymphs Abs: 1.4 10*3/uL (ref 0.7–4.0)
MCH: 28.1 pg (ref 26.0–34.0)
MCHC: 33.2 g/dL (ref 30.0–36.0)
MCV: 84.5 fL (ref 78.0–100.0)
MONOS PCT: 7 % (ref 3–12)
Monocytes Absolute: 0.6 10*3/uL (ref 0.1–1.0)
NEUTROS PCT: 76 % (ref 43–77)
Neutro Abs: 7 10*3/uL (ref 1.7–7.7)
Platelets: 202 10*3/uL (ref 150–400)
RBC: 4.7 MIL/uL (ref 3.87–5.11)
RDW: 13.3 % (ref 11.5–15.5)
WBC: 9.3 10*3/uL (ref 4.0–10.5)

## 2014-05-07 LAB — URINALYSIS, ROUTINE W REFLEX MICROSCOPIC
Bilirubin Urine: NEGATIVE
GLUCOSE, UA: NEGATIVE mg/dL
Ketones, ur: NEGATIVE mg/dL
NITRITE: NEGATIVE
PH: 5 (ref 5.0–8.0)
Protein, ur: NEGATIVE mg/dL
SPECIFIC GRAVITY, URINE: 1.017 (ref 1.005–1.030)
Urobilinogen, UA: 0.2 mg/dL (ref 0.0–1.0)

## 2014-05-07 LAB — BASIC METABOLIC PANEL
ANION GAP: 10 (ref 5–15)
BUN: 15 mg/dL (ref 6–23)
CHLORIDE: 108 mmol/L (ref 96–112)
CO2: 25 mmol/L (ref 19–32)
CREATININE: 0.87 mg/dL (ref 0.50–1.10)
Calcium: 9.1 mg/dL (ref 8.4–10.5)
GFR calc Af Amer: 70 mL/min — ABNORMAL LOW (ref >90)
GFR calc non Af Amer: 61 mL/min — ABNORMAL LOW (ref >90)
Glucose, Bld: 111 mg/dL — ABNORMAL HIGH (ref 70–99)
POTASSIUM: 3.5 mmol/L (ref 3.5–5.1)
Sodium: 143 mmol/L (ref 135–145)

## 2014-05-07 LAB — I-STAT CHEM 8, ED
BUN: 17 mg/dL (ref 6–23)
CALCIUM ION: 1.15 mmol/L (ref 1.13–1.30)
CHLORIDE: 106 mmol/L (ref 96–112)
Creatinine, Ser: 0.8 mg/dL (ref 0.50–1.10)
Glucose, Bld: 107 mg/dL — ABNORMAL HIGH (ref 70–99)
HCT: 40 % (ref 36.0–46.0)
Hemoglobin: 13.6 g/dL (ref 12.0–15.0)
Potassium: 3.5 mmol/L (ref 3.5–5.1)
Sodium: 144 mmol/L (ref 135–145)
TCO2: 21 mmol/L (ref 0–100)

## 2014-05-07 LAB — MAGNESIUM: Magnesium: 1.7 mg/dL (ref 1.5–2.5)

## 2014-05-07 LAB — I-STAT TROPONIN, ED: Troponin i, poc: 0.01 ng/mL (ref 0.00–0.08)

## 2014-05-07 LAB — URINE MICROSCOPIC-ADD ON

## 2014-05-07 LAB — PROTIME-INR
INR: 1.15 (ref 0.00–1.49)
Prothrombin Time: 14.8 seconds (ref 11.6–15.2)

## 2014-05-07 LAB — APTT: aPTT: 28 seconds (ref 24–37)

## 2014-05-07 LAB — TSH: TSH: 1.436 u[IU]/mL (ref 0.350–4.500)

## 2014-05-07 MED ORDER — SODIUM CHLORIDE 0.9 % IV BOLUS (SEPSIS)
500.0000 mL | Freq: Once | INTRAVENOUS | Status: AC
  Administered 2014-05-07: 500 mL via INTRAVENOUS

## 2014-05-07 MED ORDER — DILTIAZEM LOAD VIA INFUSION
10.0000 mg | Freq: Once | INTRAVENOUS | Status: AC
  Administered 2014-05-07: 5 mg via INTRAVENOUS
  Filled 2014-05-07: qty 10

## 2014-05-07 MED ORDER — APIXABAN 5 MG PO TABS
5.0000 mg | ORAL_TABLET | Freq: Two times a day (BID) | ORAL | Status: DC
  Administered 2014-05-07: 5 mg via ORAL

## 2014-05-07 MED ORDER — MAGNESIUM OXIDE 400 (241.3 MG) MG PO TABS
400.0000 mg | ORAL_TABLET | Freq: Two times a day (BID) | ORAL | Status: AC
  Administered 2014-05-07 – 2014-05-08 (×3): 400 mg via ORAL
  Filled 2014-05-07 (×4): qty 1

## 2014-05-07 MED ORDER — ROSUVASTATIN CALCIUM 10 MG PO TABS
10.0000 mg | ORAL_TABLET | Freq: Every day | ORAL | Status: DC
  Administered 2014-05-07 – 2014-05-08 (×2): 10 mg via ORAL
  Filled 2014-05-07 (×2): qty 1

## 2014-05-07 MED ORDER — POTASSIUM CHLORIDE ER 10 MEQ PO TBCR
40.0000 meq | EXTENDED_RELEASE_TABLET | Freq: Every day | ORAL | Status: DC
  Administered 2014-05-07 – 2014-05-09 (×3): 40 meq via ORAL
  Filled 2014-05-07 (×6): qty 4

## 2014-05-07 MED ORDER — METOPROLOL TARTRATE 25 MG PO TABS
25.0000 mg | ORAL_TABLET | Freq: Two times a day (BID) | ORAL | Status: DC
  Filled 2014-05-07: qty 1

## 2014-05-07 MED ORDER — LEVOTHYROXINE SODIUM 50 MCG PO TABS
50.0000 ug | ORAL_TABLET | Freq: Every day | ORAL | Status: DC
  Administered 2014-05-07 – 2014-05-09 (×3): 50 ug via ORAL
  Filled 2014-05-07 (×3): qty 1

## 2014-05-07 MED ORDER — DILTIAZEM HCL 100 MG IV SOLR
5.0000 mg/h | INTRAVENOUS | Status: DC
  Administered 2014-05-07: 5 mg/h via INTRAVENOUS
  Filled 2014-05-07: qty 100

## 2014-05-07 MED ORDER — APIXABAN 5 MG PO TABS
5.0000 mg | ORAL_TABLET | Freq: Two times a day (BID) | ORAL | Status: DC
  Filled 2014-05-07: qty 1

## 2014-05-07 NOTE — H&P (Signed)
Admission History and Physical     Patient ID: Abigail Taylor, MRN: [id number], DOB: [birth year] 81 y.o. Date of Encounter: 05/07/2014, 8:50 PM  Primary Physician: Glo Herring., MD Primary Cardiologist: Croitor[place]  Chief Complaint: Chest pressure  History of Present Illness: Abigail Taylor is a 81 y.o. female with a history of PAF and sick sinus syndrome, s/p atrial flutter ablation and pacemaker placement.  Also with a history of mild-moderate AR. She presents today to the ED with chest pressure starting at 4pm while mowing her lawn.  Was in her normal state of health until then, no recent fevers, chills, n/v/d, SOB, dysuria.  EMS was called and found her to be in AFib with RVR, CP was 8 out of 10.  Initially hypotensive to 80/42 but improved to 100/78 with 250 of normal saline.  She also took her home 325mg  of aspirin prior to EMS arrival.  On my exam HR remains 150s.  NIBP cuff reading 78/53, but has strong pulses between longer pauses.  Not lightheaded sitting upright.  No current CP.  Has not received ordered ditiazem yet.  She also tells me that she thinks she has been having several short spells of afib recently.   Past Medical History  Diagnosis Date  . Hypothyroidism   . Hyperlipidemia   . Angina at rest, with the tachycardia 07/05/2011  . Aortic insufficiency 07/05/2011  . Constipation 12/27/2011  . Atrial fibrillation   . Brady-tachy syndrome     Dual chamber Medtronic pacemaker Adapta  . Pacemaker      Past Surgical History  Procedure Laterality Date  . Permanent pacemaker insertion  07/05/2011    Medtronic Adapta dual chamber  . Cardiac catheterization  05/26/1999    normal  . Nm myoview ltd  07/19/2011    normal  . Colonoscopy N/A 11/15/2012    Procedure: COLONOSCOPY;  Surgeon: [name], MD;  [facility]: [facility];  Service: Endoscopy;  Laterality: N/A;  1030  . Permanent pacemaker insertion N/A 07/05/2011    Procedure: PERMANENT PACEMAKER INSERTION;   Surgeon: [name], MD;  Location: [facility];  Service: Cardiovascular;  Laterality: N/A;      Current Facility-Administered Medications  Medication Dose Route Frequency Provider Last Rate Last Dose  . apixaban (ELIQUIS) tablet 5 mg  5 mg Oral BID Herbert Deaner, MD      . diltiazem (CARDIZEM) 1 mg/mL load via infusion 10 mg  10 mg Intravenous Once Davonna Belling, MD       And  . diltiazem (CARDIZEM) 100 mg in dextrose 5 % 100 mL (1 mg/mL) infusion  5-15 mg/hr Intravenous Continuous Davonna Belling, MD      . levothyroxine (SYNTHROID, LEVOTHROID) tablet 50 mcg  50 mcg Oral Daily Herbert Deaner, MD      . metoprolol tartrate (LOPRESSOR) tablet 25 mg  25 mg Oral BID Herbert Deaner, MD      . rosuvastatin (CRESTOR) tablet 10 mg  10 mg Oral QHS Herbert Deaner, MD       Current Outpatient Prescriptions  Medication Sig Dispense Refill  . aspirin 325 MG EC tablet Take 325 mg by mouth daily.    Marland Kitchen levothyroxine (SYNTHROID, LEVOTHROID) 50 MCG tablet Take 50 mcg by mouth daily.    . rosuvastatin (CRESTOR) 10 MG tablet Take 10 mg by mouth at bedtime.        Allergies: No Known Allergies   Social History:  The patient  reports that  she has never smoked. She has never used smokeless tobacco. She reports that she does not drink alcohol or use illicit drugs.   Family History:  The patient's family history includes Colon cancer (age of onset: 75) in her mother.   ROS:  Please see the history of present illness.   All other systems reviewed and negative.   Vital Signs: Blood pressure 76/52, pulse 148, temperature 97.5 F (36.4 C), temperature source Oral, resp. rate 17, SpO2 98 %.  PHYSICAL EXAM: General:  Well nourished, well developed, in no acute distress HEENT: normal Lymph: no adenopathy Neck: no JVD Endocrine:  No thryomegaly Vascular: No carotid bruits; Radial pulses 2+ bilaterally Cardiac:  normal S1, S2; irregularly irregular and tachycardiac; no murmur Lungs:  clear to  auscultation bilaterally, no wheezing, rhonchi or rales Abd: soft, nontender, no hepatomegaly Ext: no edema Musculoskeletal:  No deformities, BUE and BLE strength normal and equal Skin: warm and dry Neuro:  CNs 2-12 intact, no focal abnormalities noted Psych:  Normal affect   EKG:   AFib with RVR, RBBB  Labs:   Lab Results  Component Value Date   WBC 9.3 05/07/2014   HGB 13.6 05/07/2014   HCT 40.0 05/07/2014   MCV 84.5 05/07/2014   PLT 202 05/07/2014    Recent Labs Lab 05/07/14 1929 05/07/14 1939  NA 143 144  K 3.5 3.5  CL 108 106  CO2 25  --   BUN 15 17  CREATININE 0.87 0.80  CALCIUM 9.1  --   GLUCOSE 111* 107*   No results for input(s): CKTOTAL, CKMB, TROPONINI in the last 72 hours. Lab Results  Component Value Date   CHOL 140 08/09/2012   HDL 40 08/09/2012   LDLCALC 68 08/09/2012   TRIG 161* 08/09/2012   No results found for: DDIMER  Radiology/Studies:  Dg Chest Portable 1 View  05/07/2014   CLINICAL DATA:  Atrial fibrillation.  Central chest pain for 3 hr.  EXAM: PORTABLE CHEST - 1 VIEW  COMPARISON:  07/06/2011  FINDINGS: Dual lead left-sided pacemaker remains in place. Mild hyperinflation, unchanged allowing for differences in technique. The cardiomediastinal contours are normal. Minimal scarring in the right upper lobe. Pulmonary vasculature is normal. No consolidation, pleural effusion, or pneumothorax. No acute osseous abnormalities are seen.  IMPRESSION: No acute pulmonary process.   Electronically Signed   By: Jeb Levering M.D.   On: 05/07/2014 19:51     ASSESSMENT AND PLAN:   1. AFib with RVR in setting of prior paroxysmal afib and several recent short spells of palpitations - diltiazem gtt to control rate - also start metoprolol PO, and hopefully transition to this alone - to step down unit, can give small fluid boluses if becomes hypotensive - Given recent episodes of palpitations and today's presentation, she should probably be anticoagulated  long term.  She is an active 81 year old without significant risk factors.  I will start apixaban 5mg  BID (although 2.5mg  BID would be reasonable as well - if she was 3 kg lighter this would be the recommended dose). Stop ASA. - Check TSH - replete mg > 2, k >4 - NPO p MN in case needs TEE/DCCV tomorrow - TTE, last was 3 years ago, and has some AR.    Signed,  Stephani Police, MD 05/07/2014, 8:50 PM

## 2014-05-07 NOTE — ED Notes (Signed)
Per EMS, pt called out due to chest pain while mowing. Pt was in A-fib when they got her on their cardiac monitor. Pts intitial BP was 80/42 improved with 250 of normal saline to 100/78. Pt took 325 of aspirin prior to EMS arrival. Pts CP was an 8 out of 10 and is now a 5 out of 10. Pt alert and oriented x4.

## 2014-05-07 NOTE — ED Notes (Signed)
Charge nurse informed appt time 2123

## 2014-05-07 NOTE — ED Provider Notes (Signed)
CSN: [id number]     Arrival date & time 05/07/14  1851 History   First MD Initiated Contact with Patient 05/07/14 1857     Chief Complaint  Patient presents with  . Atrial Fibrillation  . Chest Pain     (Consider location/radiation/quality/duration/timing/severity/associated sxs/prior Treatment) Patient is a 81 y.o. female presenting with atrial fibrillation and chest pain. The history is provided by the patient.  Atrial Fibrillation This is a recurrent problem. Associated symptoms include chest pain. Pertinent negatives include no abdominal pain, no headaches and no shortness of breath.  Chest Pain Associated symptoms: fatigue and palpitations   Associated symptoms: no abdominal pain, no back pain, no headache, no nausea, no numbness, no shortness of breath, not vomiting and no weakness    patient presented with chest pain and shortness of breath. Began when she began to feel weak all she was mowing the lawn. No fevers. States she felt lightheaded EMS was called. Found to be in A. fib with RVR and hypotensive, blood pressure improved from the 80s with a 250 mL fluid bolus. She has a history of atrial fibrillation but has not had very many episodes of it since her pacemaker was placed for tachybradycardia syndrome 3 years ago. She is on aspirin, no other anticoagulation. No cough. No fevers. No nausea vomiting. No dysuria. She states she had a dull chest pain while she was feeling a heart race. She states she can tell when she is in atrial fibrillation.   Past Medical History  Diagnosis Date  . Hypothyroidism   . Hyperlipidemia   . Angina at rest, with the tachycardia 07/05/2011  . Aortic insufficiency 07/05/2011  . Constipation 12/27/2011  . Atrial fibrillation   . Brady-tachy syndrome     Dual chamber Medtronic pacemaker Adapta  . Pacemaker    Past Surgical History  Procedure Laterality Date  . Permanent pacemaker insertion  07/05/2011    Medtronic Adapta dual chamber  . Cardiac  catheterization  05/26/1999    normal  . Nm myoview ltd  07/19/2011    normal  . Colonoscopy N/A 11/15/2012    Procedure: COLONOSCOPY;  Surgeon: [name], MD;  Location[facility];  Service: Endoscopy;  Laterality: N/A;  1030  . Permanent pacemaker insertion N/A 07/05/2011    Procedure: PERMANENT PACEMAKER INSERTION;  Surgeon: [name], MD;  Location: [facility];  Service: Cardiovascular;  Laterality: N/A;   Family History  Problem Relation Age of Onset  . Colon cancer Mother 81   History  Substance Use Topics  . Smoking status: Never Smoker   . Smokeless tobacco: Never Used  . Alcohol Use: No   OB History    No data available     Review of Systems  Constitutional: Positive for fatigue. Negative for activity change and appetite change.  Eyes: Negative for pain.  Respiratory: Negative for chest tightness and shortness of breath.   Cardiovascular: Positive for chest pain and palpitations. Negative for leg swelling.  Gastrointestinal: Negative for nausea, vomiting, abdominal pain and diarrhea.  Genitourinary: Negative for flank pain.  Musculoskeletal: Negative for back pain and neck stiffness.  Skin: Negative for rash.  Neurological: Positive for light-headedness. Negative for weakness, numbness and headaches.  Psychiatric/Behavioral: Negative for behavioral problems. The patient is nervous/anxious.       Allergies  Review of patient's allergies indicates no known allergies.  Home Medications   Prior to Admission medications   Medication Sig Start Date End Date Taking? Authorizing Provider  aspirin 325 MG EC tablet Take 325 mg by mouth daily.   Yes Historical Provider, MD  levothyroxine (SYNTHROID, LEVOTHROID) 50 MCG tablet Take 50 mcg by mouth daily.   Yes Historical Provider, MD  rosuvastatin (CRESTOR) 10 MG tablet Take 10 mg by mouth at bedtime.   Yes Historical Provider, MD   BP 105/62 mmHg  Pulse 111  Temp(Src) 98 F (36.7 C) (Oral)  Resp 18  Ht  5\' 6"  (1.676 m)  Wt 130 lb 9.6 oz (59.24 kg)  BMI 21.09 kg/m2  SpO2 96% Physical Exam  Constitutional: She is oriented to person, place, and time. She appears well-developed and well-nourished.  HENT:  Head: Normocephalic and atraumatic.  Eyes: Pupils are equal, round, and reactive to light.  Neck: Normal range of motion.  Cardiovascular:  Irregular tachycardia  Pulmonary/Chest: Effort normal and breath sounds normal. No respiratory distress. She has no wheezes. She has no rales.  Abdominal: Soft. Bowel sounds are normal. She exhibits no distension. There is no tenderness.  Musculoskeletal: Normal range of motion.  Neurological: She is alert and oriented to person, place, and time. No cranial nerve deficit.  Skin: Skin is warm and dry.  Psychiatric: She has a normal mood and affect. Her speech is normal.  Nursing note and vitals reviewed.   ED Course  Procedures (including critical care time) Labs Review Labs Reviewed  URINALYSIS, ROUTINE W REFLEX MICROSCOPIC - Abnormal; Notable for the following:    APPearance CLOUDY (*)    Hgb urine dipstick TRACE (*)    Leukocytes, UA SMALL (*)    All other components within normal limits  BASIC METABOLIC PANEL - Abnormal; Notable for the following:    Glucose, Bld 111 (*)    GFR calc non Af Amer 61 (*)    GFR calc Af Amer 70 (*)    All other components within normal limits  URINE MICROSCOPIC-ADD ON - Abnormal; Notable for the following:    Casts HYALINE CASTS (*)    All other components within normal limits  I-STAT CHEM 8, ED - Abnormal; Notable for the following:    Glucose, Bld 107 (*)    All other components within normal limits  CBC WITH DIFFERENTIAL/PLATELET  TSH  PROTIME-INR  APTT  MAGNESIUM  BASIC METABOLIC PANEL  CBC WITH DIFFERENTIAL/PLATELET  Randolm Idol, ED    Imaging Review Dg Chest Portable 1 View  05/07/2014   CLINICAL DATA:  Atrial fibrillation.  Central chest pain for 3 hr.  EXAM: PORTABLE CHEST - 1 VIEW   COMPARISON:  07/06/2011  FINDINGS: Dual lead left-sided pacemaker remains in place. Mild hyperinflation, unchanged allowing for differences in technique. The cardiomediastinal contours are normal. Minimal scarring in the right upper lobe. Pulmonary vasculature is normal. No consolidation, pleural effusion, or pneumothorax. No acute osseous abnormalities are seen.  IMPRESSION: No acute pulmonary process.   Electronically Signed   By: Jeb Levering M.D.   On: 05/07/2014 19:51     EKG Interpretation   Date/Time:  Wednesday May 07 2014 18:52:24 EDT Ventricular Rate:  142 PR Interval:    QRS Duration: 123 QT Interval:  331 QTC Calculation: 509 R Axis:   78 Text Interpretation:  Atrial fibrillation with RVR Right bundle branch  block Confirmed by Alvino Chapel  MD, Ovid Curd [phone]) on 05/07/2014 7:18:58 PM      MDM   Final diagnoses:  None     patient presented in A. Fib with RVR. History of A. Fib but is only on aspirin.  Has had some potential mild hypotension. Fluid bolus given. Started on Cardizem drip and seen by cardiology for admission.    Davonna Belling, MD 05/08/14 [phone]

## 2014-05-07 NOTE — ED Notes (Signed)
Charge nurse [facility] notified of need for Medtronic pacer evaluation.

## 2014-05-08 ENCOUNTER — Encounter (HOSPITAL_COMMUNITY): Payer: Self-pay | Admitting: General Practice

## 2014-05-08 DIAGNOSIS — I4891 Unspecified atrial fibrillation: Secondary | ICD-10-CM

## 2014-05-08 DIAGNOSIS — I350 Nonrheumatic aortic (valve) stenosis: Secondary | ICD-10-CM | POA: Diagnosis not present

## 2014-05-08 DIAGNOSIS — IMO0001 Reserved for inherently not codable concepts without codable children: Secondary | ICD-10-CM

## 2014-05-08 DIAGNOSIS — Z0389 Encounter for observation for other suspected diseases and conditions ruled out: Secondary | ICD-10-CM

## 2014-05-08 LAB — CBC WITH DIFFERENTIAL/PLATELET
BASOS ABS: 0 10*3/uL (ref 0.0–0.1)
Basophils Relative: 1 % (ref 0–1)
EOS ABS: 0.2 10*3/uL (ref 0.0–0.7)
EOS PCT: 3 % (ref 0–5)
HEMATOCRIT: 37 % (ref 36.0–46.0)
Hemoglobin: 12.2 g/dL (ref 12.0–15.0)
LYMPHS PCT: 27 % (ref 12–46)
Lymphs Abs: 1.6 10*3/uL (ref 0.7–4.0)
MCH: 28.1 pg (ref 26.0–34.0)
MCHC: 33 g/dL (ref 30.0–36.0)
MCV: 85.3 fL (ref 78.0–100.0)
Monocytes Absolute: 0.7 10*3/uL (ref 0.1–1.0)
Monocytes Relative: 11 % (ref 3–12)
NEUTROS ABS: 3.6 10*3/uL (ref 1.7–7.7)
Neutrophils Relative %: 58 % (ref 43–77)
PLATELETS: 192 10*3/uL (ref 150–400)
RBC: 4.34 MIL/uL (ref 3.87–5.11)
RDW: 13.6 % (ref 11.5–15.5)
WBC: 6.1 10*3/uL (ref 4.0–10.5)

## 2014-05-08 LAB — BASIC METABOLIC PANEL
Anion gap: 8 (ref 5–15)
BUN: 12 mg/dL (ref 6–23)
CO2: 24 mmol/L (ref 19–32)
CREATININE: 0.76 mg/dL (ref 0.50–1.10)
Calcium: 8.4 mg/dL (ref 8.4–10.5)
Chloride: 110 mmol/L (ref 96–112)
GFR calc non Af Amer: 77 mL/min — ABNORMAL LOW (ref >90)
GFR, EST AFRICAN AMERICAN: 89 mL/min — AB (ref >90)
GLUCOSE: 100 mg/dL — AB (ref 70–99)
Potassium: 3.9 mmol/L (ref 3.5–5.1)
Sodium: 142 mmol/L (ref 135–145)

## 2014-05-08 LAB — MRSA PCR SCREENING: MRSA by PCR: NEGATIVE

## 2014-05-08 MED ORDER — SODIUM CHLORIDE 0.9 % IV BOLUS (SEPSIS)
500.0000 mL | Freq: Once | INTRAVENOUS | Status: AC
  Administered 2014-05-08: 500 mL via INTRAVENOUS

## 2014-05-08 MED ORDER — DILTIAZEM HCL ER COATED BEADS 120 MG PO TB24
120.0000 mg | ORAL_TABLET | Freq: Every day | ORAL | Status: DC
  Administered 2014-05-08: 120 mg via ORAL
  Filled 2014-05-08 (×2): qty 1

## 2014-05-08 MED ORDER — GLYCERIN (LAXATIVE) 2.1 G RE SUPP
1.0000 | Freq: Once | RECTAL | Status: DC
  Filled 2014-05-08: qty 1

## 2014-05-08 MED ORDER — SODIUM CHLORIDE 0.9 % IV BOLUS (SEPSIS): 500.0000 mL | Freq: Once | INTRAVENOUS | Status: DC

## 2014-05-08 MED ORDER — METOPROLOL TARTRATE 12.5 MG HALF TABLET
12.5000 mg | ORAL_TABLET | Freq: Two times a day (BID) | ORAL | Status: DC
  Administered 2014-05-08 – 2014-05-09 (×2): 12.5 mg via ORAL
  Filled 2014-05-08 (×3): qty 1

## 2014-05-08 MED ORDER — APIXABAN 2.5 MG PO TABS
2.5000 mg | ORAL_TABLET | Freq: Two times a day (BID) | ORAL | Status: DC
  Administered 2014-05-08 – 2014-05-09 (×3): 2.5 mg via ORAL
  Filled 2014-05-08 (×3): qty 1

## 2014-05-08 MED ORDER — DILTIAZEM HCL 100 MG IV SOLR
5.0000 mg/h | INTRAVENOUS | Status: DC
  Filled 2014-05-08: qty 100

## 2014-05-08 NOTE — Progress Notes (Signed)
UR completed 

## 2014-05-08 NOTE — Progress Notes (Signed)
    Subjective:  No chest pain, feels better but can still tell her HR is irregular.  Objective:  Vital Signs in the last 24 hours: Temp:  [97.5 F (36.4 C)-98.3 F (36.8 C)] 98 F (36.7 C) (04/14 0818) Pulse Rate:  [44-148] 116 (04/14 0818) Resp:  [15-27] 18 (04/14 0818) BP: (76-193)/(44-171) 109/61 mmHg (04/14 0818) SpO2:  [96 %-100 %] 97 % (04/14 0818) Weight:  [128 lb 9.6 oz (58.333 kg)-130 lb 9.6 oz (59.24 kg)] 128 lb 9.6 oz (58.333 kg) (04/14 0446)  Intake/Output from previous day:  Intake/Output Summary (Last 24 hours) at 05/08/14 0837 Last data filed at 05/08/14 0256  Gross per 24 hour  Intake      0 ml  Output    300 ml  Net   -300 ml    Physical Exam: General appearance: alert, cooperative and no distress Lungs: clear to auscultation bilaterally Heart: irregularly irregular rhythm Extremities: no edema   Rate: 106  Rhythm: atrial fibrillation  Lab Results:  Recent Labs  05/07/14 1929 05/07/14 1939 05/08/14 0619  WBC 9.3  --  6.1  HGB 13.2 13.6 12.2  PLT 202  --  192    Recent Labs  05/07/14 1929 05/07/14 1939 05/08/14 0619  NA 143 144 142  K 3.5 3.5 3.9  CL 108 106 110  CO2 25  --  24  GLUCOSE 111* 107* 100*  BUN 15 17 12   CREATININE 0.87 0.80 0.76   No results for input(s): TROPONINI in the last 72 hours.  Invalid input(s): CK, MB  Recent Labs  05/07/14 1929  INR 1.15    Imaging: Imaging results have been reviewed   Assessment/Plan:  81 y.o. Active female with a history of PAF and sick sinus syndrome. She had PAF with a 7 second pause in June 2013 and a MDT pacemaker was placed. She is followed by Dr Sallyanne Kuster. She has been on ASA previously. Pt admitted with recurrent AF with RVR while mowing her lawn 4/13/1/6.   Principal Problem:   Atrial fibrillation with RVR Active Problems:   Paroxysmal atrial fibrillation   Sinus pause, 7 seconds post conversion, S/P MDT pacemaker 07/05/11   Hypothyroidism   Hypercholesterolemia  Aortic insufficiency   Normal coronary arteries 2001   PLAN: Eliquis added (CHADSVASc = 3 for age, female). Her rate is controlled in IV Diltiazem, she is NPO for ? Possible CV- will review with MD. If we go with 4 weeks of anticoagulation and OP cardioversion will decrease Eliquis to 2.5 mg BID.   Kerin Ransom PA-C Beeper [phone] 05/08/2014, 8:37 AM  Patient seen. Management discussed with Kerin Ransom PA-C. From her history she has been having probable prior short runs of atrial fibrillation which have been self-limited. She will benefit from anticoagulation with CHADSVASc=3. Agree with Eliquis. Consider for DCCV in 4 weeks.  Anticipate home today after echo is done.

## 2014-05-08 NOTE — Discharge Summary (Signed)
     Patient ID: Abigail Taylor,  MRN: [id number], DOB/AGE: 1931/06/19 81 y.o.  Admit date: 05/07/2014 Discharge date: 05/09/2014  Primary Care Provider: Glo Herring., MD Primary Cardiologist: Dr Sallyanne Kuster  Discharge Diagnoses Principal Problem:   Atrial fibrillation with RVR Active Problems:   Hypothyroidism   Paroxysmal atrial fibrillation   Sinus pause, 7 seconds post conversion, S/P MDT pacemaker 07/05/11   Hypercholesterolemia   Aortic insufficiency   Normal coronary arteries 2001    Hospital Course: 81 y.o. active female with a history of PAF and sick sinus syndrome. She had PAF with a 7 second pause in June 2013 and a MDT pacemaker was placed. She is followed by Dr Sallyanne Kuster. She has been on ASA previously. Pt was admitted with recurrent AF with RVR while mowing her lawn 4/13/1/6. Her Troponin was negative. Her rate improved with Diltiazem.   She spontaneously convert to NSR.  IV diltiazem was stopped and lopressor 12.5mg  BID started.  Eliquis 2.5mg  BID added as well.  She admits to episodic palpitations prior to admission.  Echo done prior to discharge revealed an EF of 60-65%.  Mild AI, MR, mildly dilated RA.  Labs stable.  She ad some hypotension on 4/14 so we gave her some IV fluids and kept her another day.  BP improved.  She ambulated around the unit the day she was discharged without difficulty.  The patient was seen by Dr. Mare Ferrari who felt she was stable for DC home.    Discharge Vitals:  Blood pressure 114/49, pulse 61, temperature 97.9 F (36.6 C), temperature source Oral, resp. rate 18, height 5\' 6"  (1.676 m), weight 130 lb 4.8 oz (59.104 kg), SpO2 100 %.   Studies: 05/08/14, Echocardiogram Study Conclusions  - Left ventricle: The cavity size was normal. Wall thickness was normal. Systolic function was normal. The estimated ejection fraction was in the range of 60% to 65%. - Aortic valve: There was mild regurgitation. - Mitral valve: There was mild  regurgitation. - Right atrium: The atrium was mildly dilated.  Labs: No results found for this or any previous visit (from the past 24 hour(s)).  Disposition:      Follow-up Information    Follow up with [facility], NP On 06/02/2014.   Specialty:  Cardiology   Why:  2:00 PM   Contact information:   [address] [place] [address] [phone]       Discharge Medications:    Medication List    STOP taking these medications        aspirin 325 MG EC tablet      TAKE these medications        apixaban 2.5 MG Tabs tablet  Commonly known as:  ELIQUIS  Take 1 tablet (2.5 mg total) by mouth 2 (two) times daily.     levothyroxine 50 MCG tablet  Commonly known as:  SYNTHROID, LEVOTHROID  Take 50 mcg by mouth daily.     metoprolol tartrate 25 MG tablet  Commonly known as:  LOPRESSOR  Take 0.5 tablets (12.5 mg total) by mouth 2 (two) times daily.     rosuvastatin 10 MG tablet  Commonly known as:  CRESTOR  Take 10 mg by mouth at bedtime.         Duration of Discharge Encounter: Greater than 30 minutes including physician time.  Otilio Connors  PA-C 05/09/2014 10:04 AM

## 2014-05-08 NOTE — Progress Notes (Signed)
Echocardiogram 2D Echocardiogram has been performed.  Abigail Taylor 05/08/2014, 12:13 PM

## 2014-05-08 NOTE — Care Management Note (Signed)
    Page 1 of 1   05/08/2014     12:14:56 PM CARE MANAGEMENT NOTE 05/08/2014  Patient:  Abigail Taylor, Abigail Taylor   Account Number:  [id number]  Date Initiated:  05/08/2014  Documentation initiated by:  GRAVES-BIGELOW,Vester Balthazor  Subjective/Objective Assessment:   Pt admitted for Afib RVR.     Action/Plan:   Benefits check in process for Eliquis. Will make pt aware once completed.   Anticipated DC Date:  05/09/2014   Anticipated DC Plan:  [place]  CM consult  Medication Assistance      Choice offered to / List presented to:             Status of service:  Completed, signed off Medicare Important Message given?  NO (If response is "NO", the following Medicare IM given date fields will be blank) Date Medicare IM given:   Medicare IM given by:   Date Additional Medicare IM given:   Additional Medicare IM given by:    Discharge Disposition:  HOME/SELF CARE  Per UR Regulation:  Reviewed for med. necessity/level of care/duration of stay  If discussed at Long Length of Stay Meetings, dates discussed:    Comments:  1205 05-08-14  Jacqlyn Krauss, RN,BSN [phone] Per Insurance: PT COPAY WILL BE  50% OF DRUG COST - THEY DO NOT KNOW HOW MUCH THE MEDICATION WILL BE. PRIOR AUTH NOT REQUIRED BUT THERE IS A QUANTITY LIMIT OF 60 FOR 30. CM did call [facility] and Eliquis is available co pay will be [phone]. CM will make pt aware. NO further needs at this time.

## 2014-05-08 NOTE — Progress Notes (Signed)
Low B/P. IVF bolus given. Will stop Diltiazem though she already received today's dose. Continue low dose metoprolol. She will need to be watched overnight.   Kerin Ransom PA-C 05/08/2014 3:15 PM

## 2014-05-09 DIAGNOSIS — I4891 Unspecified atrial fibrillation: Secondary | ICD-10-CM | POA: Diagnosis not present

## 2014-05-09 MED ORDER — APIXABAN 2.5 MG PO TABS: 2.5000 mg | ORAL_TABLET | Freq: Two times a day (BID) | ORAL | Status: DC

## 2014-05-09 MED ORDER — METOPROLOL TARTRATE 25 MG PO TABS
12.5000 mg | ORAL_TABLET | Freq: Two times a day (BID) | ORAL | Status: DC
Start: 2014-05-09 — End: 2014-06-02

## 2014-05-09 NOTE — Progress Notes (Signed)
    Subjective: No complaints  Objective: Vital signs in last 24 hours: Temp:  [97.7 F (36.5 C)-98.7 F (37.1 C)] 97.7 F (36.5 C) (04/15 0415) Pulse Rate:  [60-116] 65 (04/15 0415) Resp:  [13-18] 16 (04/15 0415) BP: (78-128)/(42-61) 123/51 mmHg (04/15 0415) SpO2:  [97 %-98 %] 98 % (04/15 0415) Weight:  [130 lb 4.8 oz (59.104 kg)] 130 lb 4.8 oz (59.104 kg) (04/15 0415)    Intake/Output from previous day: 04/14 0701 - 04/15 0700 In: 560 [P.O.:560] Out: 450 [Urine:450] Intake/Output this shift:    Medications Current Facility-Administered Medications  Medication Dose Route Frequency Provider Last Rate Last Dose  . apixaban (ELIQUIS) tablet 2.5 mg  2.5 mg Oral BID Erlene Quan, PA-C   2.5 mg at 05/08/14 2151  . Glycerin (Adult) 2.1 G suppository 1 suppository  1 suppository Rectal Once Luke K Kilroy, PA-C      . levothyroxine (SYNTHROID, LEVOTHROID) tablet 50 mcg  50 mcg Oral Daily Herbert Deaner, MD   50 mcg at 05/08/14 0957  . metoprolol tartrate (LOPRESSOR) tablet 12.5 mg  12.5 mg Oral BID Doreene Burke Kilroy, PA-C   12.5 mg at 05/08/14 0957  . potassium chloride (K-DUR) CR tablet 40 mEq  40 mEq Oral Daily Herbert Deaner, MD   40 mEq at 05/08/14 0958  . rosuvastatin (CRESTOR) tablet 10 mg  10 mg Oral QHS Herbert Deaner, MD   10 mg at 05/08/14 2151    PE: General appearance: alert, cooperative and no distress Lungs: clear to auscultation bilaterally Heart: regular rate and rhythm and No MR.  occassional PAC Extremities: No LEE Pulses: 2+ and symmetric Skin: Warm and dry Neurologic: Grossly normal  Lab Results:   Recent Labs  05/07/14 1929 05/07/14 1939 05/08/14 0619  WBC 9.3  --  6.1  HGB 13.2 13.6 12.2  HCT 39.7 40.0 37.0  PLT 202  --  192   BMET  Recent Labs  05/07/14 1929 05/07/14 1939 05/08/14 0619  NA 143 144 142  K 3.5 3.5 3.9  CL 108 106 110  CO2 25  --  24  GLUCOSE 111* 107* 100*  BUN 15 17 12   CREATININE 0.87 0.80 0.76  CALCIUM 9.1  --  8.4     PT/INR  Recent Labs  05/07/14 1929  LABPROT 14.8  INR 1.15      Assessment/Plan  Principal Problem:   Atrial fibrillation with RVR Active Problems:   Hypothyroidism   Paroxysmal atrial fibrillation   Sinus pause, 7 seconds post conversion, S/P MDT pacemaker 07/05/11   Hypercholesterolemia   Aortic insufficiency   Normal coronary arteries 2001     Eliquis added (CHADSVASc = 3 for age, female). Maintaining SR on lopressor 12.5 BID.   Eliquis to 2.5 mg BID.  BP improved after NS bolus yesterday.  She ambulated around the unit this morning without difficulty.  HR 78 when done.   OK for DC home today.    Tarri Fuller PA-C 05/09/2014 7:36 AM  Patient in NSR, feels well. No further weak spells. Lungs clear.  Echo showed EF 60-65%.  Okay for discharge today on BB and Eliquis.

## 2014-05-27 DIAGNOSIS — R5381 Other malaise: Secondary | ICD-10-CM | POA: Diagnosis not present

## 2014-05-27 DIAGNOSIS — R471 Dysarthria and anarthria: Secondary | ICD-10-CM | POA: Diagnosis not present

## 2014-05-27 DIAGNOSIS — I482 Chronic atrial fibrillation: Secondary | ICD-10-CM | POA: Diagnosis not present

## 2014-05-27 DIAGNOSIS — G20 Parkinson's disease: Secondary | ICD-10-CM | POA: Diagnosis not present

## 2014-05-29 ENCOUNTER — Telehealth: Payer: Self-pay | Admitting: Cardiovascular Disease

## 2014-05-29 NOTE — Telephone Encounter (Signed)
Patient stated that she ordered the [facility] a month ago, but she still has not received it. I explained to patient that they have been on back order, but it should get to her shortly. Patient asked if she could send a remote the traditional way. I told her that she could. Patient voiced understanding.

## 2014-05-29 NOTE — Telephone Encounter (Signed)
Please call,question about using her remote pacemaker.

## 2014-06-02 ENCOUNTER — Encounter: Payer: Self-pay | Admitting: Cardiovascular Disease

## 2014-06-02 ENCOUNTER — Encounter: Payer: Self-pay | Admitting: Cardiology

## 2014-06-02 ENCOUNTER — Ambulatory Visit (INDEPENDENT_AMBULATORY_CARE_PROVIDER_SITE_OTHER): Payer: Medicare Other | Admitting: Cardiology

## 2014-06-02 ENCOUNTER — Ambulatory Visit (INDEPENDENT_AMBULATORY_CARE_PROVIDER_SITE_OTHER): Payer: Medicare Other | Admitting: *Deleted

## 2014-06-02 VITALS — BP 126/58 | HR 71 | Ht 66.0 in | Wt 128.6 lb

## 2014-06-02 DIAGNOSIS — I4891 Unspecified atrial fibrillation: Secondary | ICD-10-CM | POA: Diagnosis not present

## 2014-06-02 DIAGNOSIS — I495 Sick sinus syndrome: Secondary | ICD-10-CM | POA: Diagnosis not present

## 2014-06-02 MED ORDER — DILTIAZEM HCL ER COATED BEADS 120 MG PO CP24: 120.0000 mg | ORAL_CAPSULE | Freq: Every day | ORAL | Status: DC

## 2014-06-02 NOTE — Progress Notes (Signed)
Cardiology Office Note   Date:  06/02/2014   ID:  Abigail Taylor, DOB 1931/02/27, MRN [id number]  PCP:  Abigail Taylor., MD  Cardiologist:  Dr. Sallyanne Taylor     Chief Complaint  Patient presents with  . Atrial Fibrillation    post hospital       History of Present Illness: Abigail Taylor is a 81 y.o. female who presents for hospital follow up for a fib with RVR.  She is on eliquis  (CHADSVASc = 3 for age, female). Hx of medtronic pacemaker for 7 sec pause.  She had echo done in the hospital with normal EF, mild MR and mild AR.  She had converted to SR prior to discharge.  She has no chest pain and no SOB.  Other hx of Paroxysmal atrial flutter ablation, sinus node dysfunction.  Normal nuc study in 2013.  Since discharge she has been diagnosed with mild parkinson's disease and is on medication.  She does complain today of severe fatigue with BB.  She has to take a nap after taking and is only on 12.5 BID.  TSH has been normal.     Past Medical History  Diagnosis Date  . Hypothyroidism   . Hyperlipidemia   . Angina at rest, with the tachycardia 07/05/2011  . Aortic insufficiency 07/05/2011  . Constipation 12/27/2011  . Atrial fibrillation   . Brady-tachy syndrome     Dual chamber Medtronic pacemaker Adapta  . Pacemaker   . OA (osteoarthritis)     Past Surgical History  Procedure Laterality Date  . Permanent pacemaker insertion  07/05/2011    Medtronic Adapta dual chamber  . Cardiac catheterization  05/26/1999    normal  . Nm myoview ltd  07/19/2011    normal  . Colonoscopy N/A 11/15/2012    Procedure: COLONOSCOPY;  Surgeon: Abigail Houston, MD;  [facility];  Service: Endoscopy;  Laterality: N/A;  1030  . Permanent pacemaker insertion N/A 07/05/2011    Procedure: PERMANENT PACEMAKER INSERTION;  Surgeon: Abigail Klein, MD;  Location: [facility];  Service: Cardiovascular;  Laterality: N/A;  . Insert / replace / remove pacemaker    . Abdominal hysterectomy        Current Outpatient Prescriptions  Medication Sig Dispense Refill  . apixaban (ELIQUIS) 2.5 MG TABS tablet Take 1 tablet (2.5 mg total) by mouth 2 (two) times daily. 60 tablet 0  . carbidopa-levodopa (SINEMET IR) 25-100 MG per tablet Take 1 tablet by mouth daily.    Marland Kitchen levothyroxine (SYNTHROID, LEVOTHROID) 50 MCG tablet Take 50 mcg by mouth daily.    . metoprolol tartrate (LOPRESSOR) 25 MG tablet Take 0.5 tablets (12.5 mg total) by mouth 2 (two) times daily. 60 tablet 11  . rosuvastatin (CRESTOR) 10 MG tablet Take 10 mg by mouth at bedtime.     No current facility-administered medications for this visit.    Allergies:   Review of patient's allergies indicates no known allergies.    Social History:  The patient  reports that she has never smoked. She has never used smokeless tobacco. She reports that she does not drink alcohol or use illicit drugs.   Family History:  The patient's family history includes Colon cancer (age of onset: 105) in her mother.    ROS:  General:no colds or fevers, no weight changes Skin:no rashes or ulcers HEENT:no blurred vision, no congestion CV:see HPI PUL:see HPI GI:no diarrhea constipation or melena, no indigestion GU:no hematuria, no dysuria MS:no joint pain,  no claudication Neuro:no syncope, no lightheadedness, + Parkinson's disease with mild tremor  Endo:no diabetes, + thyroid disease  Wt Readings from Last 3 Encounters:  06/02/14 128 lb 9.6 oz (58.333 kg)  05/09/14 130 lb 4.8 oz (59.104 kg)  08/27/13 139 lb 12.8 oz (63.413 kg)     PHYSICAL EXAM: VS:  BP 126/58 mmHg  Pulse 71  Ht 5\' 6"  (1.676 m)  Wt 128 lb 9.6 oz (58.333 kg)  BMI 20.77 kg/m2 , BMI Body mass index is 20.77 kg/(m^2). General:Pleasant affect, NAD Skin:Warm and dry, brisk capillary refill HEENT:normocephalic, sclera clear, mucus membranes moist Neck:supple, no JVD, no bruits  Heart:S1S2 RRR without murmur, gallup, rub or click Lungs:clear without rales, rhonchi, or  wheezes MOL:MBEM, non tender, + BS, do not palpate liver spleen or masses Ext:no lower ext edema, 2+ pedal pulses, 2+ radial pulses Neuro:alert and oriented, MAE, follows commands, + facial symmetry    EKG:  EKG is ordered today. The ekg ordered today demonstrates SR with atrial pacing at times. No acute changes.     Recent Labs: 05/07/2014: Magnesium 1.7; TSH 1.436 05/08/2014: BUN 12; Creatinine 0.76; Hemoglobin 12.2; Platelets 192; Potassium 3.9; Sodium 142    Lipid Panel    Component Value Date/Time   CHOL 140 08/09/2012 1142   TRIG 161* 08/09/2012 1142   HDL 40 08/09/2012 1142   CHOLHDL 3.5 08/09/2012 1142   VLDL 32 08/09/2012 1142   LDLCALC 68 08/09/2012 1142       Other studies Reviewed: Additional studies/ records that were reviewed today include: hospital notes and strips..   ASSESSMENT AND PLAN:    Atrial fibrillation with RVR- now maintaining SR with a pacing at times.  She had her pacer remotely interrogated today I have called for results to eval if further a fib.  She will follow up with Abigail Taylor in 3 months.   Anticoagulation --Eliquis added (CHADSVASc = 3 for age, female). No bleeding we did give some samples today, she was concerned about cost, but prefers cost to coumadin.  Fatigue, with BB.  Stop BB and take cardizem CD 120 mg every evening   Hypothyroidism-stable   Paroxysmal atrial fibrillation see above   Sinus pause, 7 seconds post conversion, S/P MDT pacemaker 07/05/11   Hypercholesterolemia will need eval in near future.   Aortic insufficiency   Normal coronary arteries 2001    Parkinson's disease- just diagnosed.     Current medicines are reviewed with the patient today.  The patient Has no concerns regarding medicines.  The following changes have been made:  See above Labs/ tests ordered today include:see above  Disposition:   FU:  see above  Abigail Muckle, NP  06/02/2014 2:09 PM    [facility] [place], [place], [place] [place], [place] Phone: [phone]; Fax: [phone]

## 2014-06-02 NOTE — Progress Notes (Signed)
Remote pacemaker transmission.   

## 2014-06-02 NOTE — Patient Instructions (Signed)
Your physician recommends that you schedule a follow-up appointment in: 2-3 MONTHS WITH DR CROITORU  STOP METOPROLOL  START DILTIAZEM CD 120 MG ONCE DAILY

## 2014-06-04 LAB — CUP PACEART REMOTE DEVICE CHECK
Brady Statistic AP VP Percent: 0 %
Brady Statistic AP VS Percent: 30 %
Brady Statistic AS VS Percent: 70 %
Lead Channel Impedance Value: 436 Ohm
Lead Channel Impedance Value: 569 Ohm
Lead Channel Pacing Threshold Amplitude: 0.625 V
Lead Channel Pacing Threshold Amplitude: 0.75 V
Lead Channel Pacing Threshold Pulse Width: 0.4 ms
Lead Channel Pacing Threshold Pulse Width: 0.4 ms
Lead Channel Sensing Intrinsic Amplitude: 22.4 mV
Lead Channel Setting Pacing Amplitude: 1.5 V
MDC IDC MSMT BATTERY IMPEDANCE: 183 Ohm
MDC IDC MSMT BATTERY REMAINING LONGEVITY: 149 mo
MDC IDC MSMT BATTERY VOLTAGE: 2.79 V
MDC IDC MSMT LEADCHNL RA SENSING INTR AMPL: 2.8 mV
MDC IDC SESS DTM: 20160509130040
MDC IDC SET LEADCHNL RV PACING AMPLITUDE: 2 V
MDC IDC SET LEADCHNL RV PACING PULSEWIDTH: 0.4 ms
MDC IDC SET LEADCHNL RV SENSING SENSITIVITY: 4 mV
MDC IDC STAT BRADY AS VP PERCENT: 0 %

## 2014-06-10 ENCOUNTER — Encounter: Payer: Self-pay | Admitting: Cardiology

## 2014-07-01 ENCOUNTER — Ambulatory Visit (INDEPENDENT_AMBULATORY_CARE_PROVIDER_SITE_OTHER): Payer: Medicare Other | Admitting: Internal Medicine

## 2014-07-03 ENCOUNTER — Ambulatory Visit (INDEPENDENT_AMBULATORY_CARE_PROVIDER_SITE_OTHER): Payer: Medicare Other | Admitting: Internal Medicine

## 2014-07-03 ENCOUNTER — Encounter (INDEPENDENT_AMBULATORY_CARE_PROVIDER_SITE_OTHER): Payer: Self-pay | Admitting: Internal Medicine

## 2014-07-03 VITALS — BP 100/52 | HR 65 | Temp 97.4°F | Ht 66.0 in | Wt 127.3 lb

## 2014-07-03 DIAGNOSIS — K5909 Other constipation: Secondary | ICD-10-CM

## 2014-07-03 NOTE — Progress Notes (Addendum)
   Subjective:    Patient ID: Abigail Taylor, female    DOB: 1931/12/21, 81 y.o.   MRN: [id number]  HPI Here today for f/u. He tells me he is doing okay. Hx of atrial fib. Presently taking Eliquis for her atrial fib. She recently at [facility] about 2 months ago for rapid heart beat.  Appetite is okay.   No abdominal pain .Usually has a BM dialy. No melena or BRRB. She takes a glycerin supp daily to have a BM.  She says she now has Parkinson's disease on her rt side per Dr. Merlene Laughter. She is not exercising. Family hx of colon cancer in a mother age 19  11/15/2012 Colonoscopy: Family hx of colon cancer (mother) Impression:  Examination performed to cecum.  Moderate sigmoid colon diverticulosis.  External hemorrhoids and sentinel skin tags.  Review of Systems Past Medical History  Diagnosis Date  . Hypothyroidism   . Hyperlipidemia   . Angina at rest, with the tachycardia 07/05/2011  . Aortic insufficiency 07/05/2011  . Constipation 12/27/2011  . Atrial fibrillation   . Brady-tachy syndrome     Dual chamber Medtronic pacemaker Adapta  . Pacemaker   . OA (osteoarthritis)     Past Surgical History  Procedure Laterality Date  . Permanent pacemaker insertion  07/05/2011    Medtronic Adapta dual chamber  . Cardiac catheterization  05/26/1999    normal  . Nm myoview ltd  07/19/2011    normal  . Colonoscopy N/A 11/15/2012    Procedure: COLONOSCOPY;  Surgeon: [name], MD;  [facility];  Service: Endoscopy;  Laterality: N/A;  1030  . Permanent pacemaker insertion N/A 07/05/2011    Procedure: PERMANENT PACEMAKER INSERTION;  Surgeon: [name], MD;  Location: [place] [facility] C[facility] LAB;  Service: Cardiovascular;  Laterality: N/A;  . Insert / replace / remove pacemaker    . Abdominal hysterectomy      No Known Allergies  Current Outpatient Prescriptions on File Prior to Visit  Medication Sig Dispense Refill  . apixaban (ELIQUIS) 2.5 MG TABS tablet Take 1 tablet (2.5 mg total) by mouth  2 (two) times daily. 60 tablet 0  . carbidopa-levodopa (SINEMET IR) 25-100 MG per tablet Take 1 tablet by mouth daily.    Marland Kitchen diltiazem (CARDIZEM CD) 120 MG 24 hr capsule Take 1 capsule (120 mg total) by mouth daily. 90 capsule 3  . levothyroxine (SYNTHROID, LEVOTHROID) 50 MCG tablet Take 50 mcg by mouth daily.    . rosuvastatin (CRESTOR) 10 MG tablet Take 10 mg by mouth at bedtime.     No current facility-administered medications on file prior to visit.        Objective:   Physical Exam Blood pressure 100/52, pulse 65, temperature 97.4 F (36.3 C), height 5\' 6"  (1.676 m), weight 127 lb 4.8 oz (57.743 kg). Alert and oriented. Skin warm and dry. Oral mucosa is moist.   . Sclera anicteric, conjunctivae is pink. Thyroid not enlarged. No cervical lymphadenopathy. Lungs clear. Heart regular rate and rhythm.  Abdomen is soft. Bowel sounds are positive. No hepatomegaly. No abdominal masses felt. No tenderness.  No edema to lower extremities.         Assessment & Plan:  Constipation. Uses glycerin supp daily. She seems to be doing well from a GI standpoint OV in 1 year.

## 2014-07-03 NOTE — Patient Instructions (Signed)
OV in 1 year.  

## 2014-07-08 DIAGNOSIS — I739 Peripheral vascular disease, unspecified: Secondary | ICD-10-CM | POA: Diagnosis not present

## 2014-07-24 DIAGNOSIS — G20 Parkinson's disease: Secondary | ICD-10-CM | POA: Diagnosis not present

## 2014-07-24 DIAGNOSIS — R5381 Other malaise: Secondary | ICD-10-CM | POA: Diagnosis not present

## 2014-07-24 DIAGNOSIS — R471 Dysarthria and anarthria: Secondary | ICD-10-CM | POA: Diagnosis not present

## 2014-07-24 DIAGNOSIS — I482 Chronic atrial fibrillation: Secondary | ICD-10-CM | POA: Diagnosis not present

## 2014-08-20 DIAGNOSIS — I4891 Unspecified atrial fibrillation: Secondary | ICD-10-CM | POA: Diagnosis not present

## 2014-08-20 DIAGNOSIS — E063 Autoimmune thyroiditis: Secondary | ICD-10-CM | POA: Diagnosis not present

## 2014-08-20 DIAGNOSIS — Z1389 Encounter for screening for other disorder: Secondary | ICD-10-CM | POA: Diagnosis not present

## 2014-08-20 DIAGNOSIS — E785 Hyperlipidemia, unspecified: Secondary | ICD-10-CM | POA: Diagnosis not present

## 2014-08-20 DIAGNOSIS — G894 Chronic pain syndrome: Secondary | ICD-10-CM | POA: Diagnosis not present

## 2014-08-20 DIAGNOSIS — Z681 Body mass index (BMI) 19 or less, adult: Secondary | ICD-10-CM | POA: Diagnosis not present

## 2014-08-27 ENCOUNTER — Ambulatory Visit (INDEPENDENT_AMBULATORY_CARE_PROVIDER_SITE_OTHER): Payer: Medicare Other | Admitting: Cardiovascular Disease

## 2014-08-27 VITALS — BP 110/54 | HR 77 | Resp 16 | Ht 66.0 in | Wt 126.2 lb

## 2014-08-27 DIAGNOSIS — I455 Other specified heart block: Secondary | ICD-10-CM | POA: Diagnosis not present

## 2014-08-27 DIAGNOSIS — Z950 Presence of cardiac pacemaker: Secondary | ICD-10-CM | POA: Diagnosis not present

## 2014-08-27 DIAGNOSIS — I480 Paroxysmal atrial fibrillation: Secondary | ICD-10-CM

## 2014-08-27 DIAGNOSIS — I351 Nonrheumatic aortic (valve) insufficiency: Secondary | ICD-10-CM | POA: Diagnosis not present

## 2014-08-27 NOTE — Patient Instructions (Signed)
Your physician wants you to follow-up in: 1 Year. You will receive a reminder letter in the mail two months in advance. If you don't receive a letter, please call our office to schedule the follow-up appointment.  Remote monitoring is used to monitor your Pacemaker of ICD from home. This monitoring reduces the number of office visits required to check your device to one time per year. It allows [place] to keep an eye on the functioning of your device to ensure it is working properly. You are scheduled for a device check from home on 11/25/2014. You may send your transmission at any time that day. If you have a wireless device, the transmission will be sent automatically. After your physician reviews your transmission, you will receive a postcard with your next transmission date.

## 2014-08-28 ENCOUNTER — Telehealth: Payer: Self-pay | Admitting: Cardiology

## 2014-08-28 NOTE — Telephone Encounter (Signed)
-----   Message from Shiela Mayer, [place] sent at 08/27/2014  4:17 PM EDT -----   ----- Message -----    From: [name], MD    Sent: 08/27/2014   3:26 PM      To: Shiela Mayer, CMA  Please call her to see if you can help with wireX setup. She has not had any luck with transmission. Checked her PM in office today. Thanks [employer]

## 2014-08-28 NOTE — Telephone Encounter (Signed)
Spoke w/ pt and she stated that she will call back on Friday 08-29-14 to receive help w/ setting up home monitor. She was getting ready to leave her home.

## 2014-08-29 ENCOUNTER — Telehealth: Payer: Self-pay | Admitting: *Deleted

## 2014-08-29 MED ORDER — RIVAROXABAN 20 MG PO TABS: 20.0000 mg | ORAL_TABLET | Freq: Every day | ORAL | Status: DC

## 2014-08-29 NOTE — Telephone Encounter (Signed)
Spoke w/ pt and informed her that her wirex needed to be near a window. Pt had only had wirex plugged in for about 1 hour. I informed pt that it can take up to 24 hours before the wirex gets a signal. Pt will call back on Monday 09-01-14.

## 2014-08-29 NOTE — Telephone Encounter (Signed)
[place] and asked if patient was to switch from Eliquis to Xarelto what will be the difference in price, because patient is paying $173. Monthly on her Eliquis, pharmacist staed her monthly cost for Xarelto will be $47. As per Dr Sallyanne Kuster pt is to switch from Eliquis to Xarelto 20 mg 1 tablet daily with meal, #90 days with 3 refill was send into [facility].

## 2014-08-30 ENCOUNTER — Encounter: Payer: Self-pay | Admitting: Cardiovascular Disease

## 2014-08-30 NOTE — Progress Notes (Signed)
Patient ID: Abigail Taylor, female   DOB: 10/26/1931, 81 y.o.   MRN: [id number]     Cardiology Office Note   Date:  08/30/2014   ID:  LYDIAH PONG, DOB [birth year], MRN [id number]  PCP:  Glo Herring., MD  Cardiologist:   [name], MD   Chief Complaint  Patient presents with  . Follow-up      History of Present Illness: Abigail Taylor is a 81 y.o. female who presents fortachycardia-bradycardia syndrome with history of sinus arrest status post implantation of a dual-chamber permanent pacemaker and paroxysmal atrial fibrillation. She has normal left ventricular systolic function and did not have coronary artery disease by remote angiography and a normal nuclear stress test in 2013. He was briefly hospitalized for self terminated atrial fibrillation in April 2016..bleeding complications. She does not have a history of stroke or other embolic events.   overall she feels well and she has not had new cardiac challenges. Unfortunate she was diagnosed with Parkinson's disease and has started treatment with Sinemet. She denies angina , palpitations, syncope, dyspnea at rest or with activity. Interrogation of her pacemaker shows normal device function with estimated generator longevity of about 12 years (Medtronic Adapta dual-chamber). Lead parameters are excellent. She has roughly 34% atrial pacing and virtually no ventricular pacing.   several episodes of atrial tachyarrhythmia have been recorded. The longest actually appears to be atrial tachycardia with one-to-one conduction lasting for an hour and 45 minutes. The most recent episode that is clearly consistent with atrial fibrillation lasted for about 8 minutes in April.    Past Medical History  Diagnosis Date  . Hypothyroidism   . Hyperlipidemia   . Angina at rest, with the tachycardia 07/05/2011  . Aortic insufficiency 07/05/2011  . Constipation 12/27/2011  . Atrial fibrillation   . Brady-tachy syndrome     Dual chamber  Medtronic pacemaker Adapta  . Pacemaker   . OA (osteoarthritis)     Past Surgical History  Procedure Laterality Date  . Permanent pacemaker insertion  07/05/2011    Medtronic Adapta dual chamber  . Cardiac catheterization  05/26/1999    normal  . Nm myoview ltd  07/19/2011    normal  . Colonoscopy N/A 11/15/2012    Procedure: COLONOSCOPY;  Surgeon: [name], MD;  [facility];  Service: Endoscopy;  Laterality: N/A;  1030  . Permanent pacemaker insertion N/A 07/05/2011    Procedure: PERMANENT PACEMAKER INSERTION;  Surgeon: [name], MD;  Location: [facility] CATH LAB;  Service: Cardiovascular;  Laterality: N/A;  . Insert / replace / remove pacemaker    . Abdominal hysterectomy       Current Outpatient Prescriptions  Medication Sig Dispense Refill  . carbidopa-levodopa (SINEMET IR) 25-100 MG per tablet Take 1 tablet by mouth daily.    Marland Kitchen diltiazem (CARDIZEM CD) 120 MG 24 hr capsule Take 1 capsule (120 mg total) by mouth daily. 90 capsule 3  . HYDROcodone-acetaminophen (NORCO/VICODIN) 5-325 MG per tablet Take 1 tablet by mouth every 6 (six) hours as needed for moderate pain.    Marland Kitchen levothyroxine (SYNTHROID, LEVOTHROID) 50 MCG tablet Take 50 mcg by mouth daily.    . rosuvastatin (CRESTOR) 10 MG tablet Take 10 mg by mouth at bedtime.    . rivaroxaban (XARELTO) 20 MG TABS tablet Take 1 tablet (20 mg total) by mouth daily with supper. 90 tablet 3   No current facility-administered medications for this visit.    Allergies:   Review of patient's allergies  indicates no known allergies.    Social History:  The patient  reports that she has never smoked. She has never used smokeless tobacco. She reports that she does not drink alcohol or use illicit drugs.   Family History:  The patient's family history includes Cancer in her father; Colon cancer (age of onset: 3) in her mother; Heart attack in her mother.    ROS:  Please see the history of present illness.    Otherwise,  review of systems positive for  Mild tremor , sluggishness, orthostatic dizziness.   All other systems are reviewed and negative.    PHYSICAL EXAM: VS:  BP 110/54 mmHg  Pulse 77  Resp 16  Ht 5\' 6"  (1.676 m)  Wt 126 lb 3.2 oz (57.244 kg)  BMI 20.38 kg/m2 , BMI Body mass index is 20.38 kg/(m^2).  General: Alert, oriented x3, no distress Head: no evidence of trauma, PERRL, EOMI, no exophtalmos or lid lag, no myxedema, no xanthelasma; normal ears, nose and oropharynx Neck: normal jugular venous pulsations and no hepatojugular reflux; brisk carotid pulses without delay and no carotid bruits Chest: clear to auscultation, no signs of consolidation by percussion or palpation, normal fremitus, symmetrical and full respiratory excursions Cardiovascular: normal position and quality of the apical impulse, regular rhythm, normal first and second heart sounds, no  murmurs, rubs or gallops Abdomen: no tenderness or distention, no masses by palpation, no abnormal pulsatility or arterial bruits, normal bowel sounds, no hepatosplenomegaly Extremities: no clubbing, cyanosis or edema; 2+ radial, ulnar and brachial pulses bilaterally; 2+ right femoral, posterior tibial and dorsalis pedis pulses; 2+ left femoral, posterior tibial and dorsalis pedis pulses; no subclavian or femoral bruits Neurological: grossly nonfocal except minimal resting tremor Psych: euthymic mood, full affect   EKG:  EKG is not ordered today. Recent Labs: 05/07/2014: Magnesium 1.7; TSH 1.436 05/08/2014: BUN 12; Creatinine, Ser 0.76; Hemoglobin 12.2; Platelets 192; Potassium 3.9; Sodium 142    Lipid Panel    Component Value Date/Time   CHOL 140 08/09/2012 1142   TRIG 161* 08/09/2012 1142   HDL 40 08/09/2012 1142   CHOLHDL 3.5 08/09/2012 1142   VLDL 32 08/09/2012 1142   LDLCALC 68 08/09/2012 1142      Wt Readings from Last 3 Encounters:  08/27/14 126 lb 3.2 oz (57.244 kg)  07/03/14 127 lb 4.8 oz (57.743 kg)  06/02/14 128 lb  9.6 oz (58.333 kg)    .   ASSESSMENT AND PLAN:  1.   Paroxysmal atrial fibrillation, infrequent and minimally symptomatic on appropriate anticoagulation therapy (CHADSVasc 3). 2.  Sinus node dysfunction with normally functioning dual-chamber permanent pacemaker 3.  Recently diagnosed Parkinson's disease 4.  Hyperlipidemia on statin therapy   we'll continue remote monitoring via the [employer] system every 3 months. Follow-up in the office yearly.  Current medicines are reviewed at length with the patient today.  The patient does not have concerns regarding medicines.  The following changes have been made:  no change  Labs/ tests ordered today include:  No orders of the defined types were placed in this encounter.    Patient Instructions  Your physician wants you to follow-up in: 1 Year. You will receive a reminder letter in the mail two months in advance. If you don't receive a letter, please call our office to schedule the follow-up appointment.  Remote monitoring is used to monitor your Pacemaker of ICD from home. This monitoring reduces the number of office visits required to check your device to one  time per year. It allows [place] to keep an eye on the functioning of your device to ensure it is working properly. You are scheduled for a device check from home on 11/25/2014. You may send your transmission at any time that day. If you have a wireless device, the transmission will be sent automatically. After your physician reviews your transmission, you will receive a postcard with your next transmission date.        Mikael Spray, MD  08/30/2014 6:10 PM    [name], MD, [facility] [phone] office [phone] pager

## 2014-09-08 ENCOUNTER — Encounter: Payer: Self-pay | Admitting: Cardiovascular Disease

## 2014-09-17 DIAGNOSIS — M47816 Spondylosis without myelopathy or radiculopathy, lumbar region: Secondary | ICD-10-CM | POA: Diagnosis not present

## 2014-09-17 DIAGNOSIS — M4806 Spinal stenosis, lumbar region: Secondary | ICD-10-CM | POA: Diagnosis not present

## 2014-09-17 DIAGNOSIS — M545 Low back pain: Secondary | ICD-10-CM | POA: Diagnosis not present

## 2014-09-22 ENCOUNTER — Telehealth: Payer: Self-pay | Admitting: Cardiovascular Disease

## 2014-09-22 NOTE — Telephone Encounter (Signed)
Ms. Polgar called in stating that Dr. Loletha Grayer has switched her BP meds from Eliquis to Xarelto. She voiced that she still has some samples of Eliquis and wanted to know if she could finish those before starting on the Xarelto. Please call  Thanks

## 2014-09-22 NOTE — Telephone Encounter (Signed)
Advised pt Ok to continue Eliquis until out of medication. Gave instruction on discontinuing Eliquis and starting Xarelto. Pt voiced understanding of instructions.

## 2014-09-26 DIAGNOSIS — M545 Low back pain: Secondary | ICD-10-CM | POA: Diagnosis not present

## 2014-09-26 DIAGNOSIS — M4806 Spinal stenosis, lumbar region: Secondary | ICD-10-CM | POA: Diagnosis not present

## 2014-09-26 DIAGNOSIS — M47816 Spondylosis without myelopathy or radiculopathy, lumbar region: Secondary | ICD-10-CM | POA: Diagnosis not present

## 2014-09-30 DIAGNOSIS — I739 Peripheral vascular disease, unspecified: Secondary | ICD-10-CM | POA: Diagnosis not present

## 2014-10-06 ENCOUNTER — Encounter: Payer: Self-pay | Admitting: Cardiovascular Disease

## 2014-10-27 DIAGNOSIS — M47816 Spondylosis without myelopathy or radiculopathy, lumbar region: Secondary | ICD-10-CM | POA: Diagnosis not present

## 2014-10-27 DIAGNOSIS — M4806 Spinal stenosis, lumbar region: Secondary | ICD-10-CM | POA: Diagnosis not present

## 2014-10-27 DIAGNOSIS — M545 Low back pain: Secondary | ICD-10-CM | POA: Diagnosis not present

## 2014-10-31 DIAGNOSIS — M545 Low back pain: Secondary | ICD-10-CM | POA: Diagnosis not present

## 2014-10-31 DIAGNOSIS — M4806 Spinal stenosis, lumbar region: Secondary | ICD-10-CM | POA: Diagnosis not present

## 2014-10-31 DIAGNOSIS — M47816 Spondylosis without myelopathy or radiculopathy, lumbar region: Secondary | ICD-10-CM | POA: Diagnosis not present

## 2014-11-11 DIAGNOSIS — H524 Presbyopia: Secondary | ICD-10-CM | POA: Diagnosis not present

## 2014-11-11 DIAGNOSIS — H5203 Hypermetropia, bilateral: Secondary | ICD-10-CM | POA: Diagnosis not present

## 2014-11-11 DIAGNOSIS — H52223 Regular astigmatism, bilateral: Secondary | ICD-10-CM | POA: Diagnosis not present

## 2014-11-11 DIAGNOSIS — H43813 Vitreous degeneration, bilateral: Secondary | ICD-10-CM | POA: Diagnosis not present

## 2014-11-17 DIAGNOSIS — G20 Parkinson's disease: Secondary | ICD-10-CM | POA: Diagnosis not present

## 2014-11-17 DIAGNOSIS — M533 Sacrococcygeal disorders, not elsewhere classified: Secondary | ICD-10-CM | POA: Diagnosis not present

## 2014-11-17 DIAGNOSIS — M542 Cervicalgia: Secondary | ICD-10-CM | POA: Diagnosis not present

## 2014-11-17 DIAGNOSIS — R471 Dysarthria and anarthria: Secondary | ICD-10-CM | POA: Diagnosis not present

## 2014-11-17 DIAGNOSIS — Z79899 Other long term (current) drug therapy: Secondary | ICD-10-CM | POA: Diagnosis not present

## 2014-11-17 DIAGNOSIS — I482 Chronic atrial fibrillation: Secondary | ICD-10-CM | POA: Diagnosis not present

## 2014-11-17 DIAGNOSIS — R5381 Other malaise: Secondary | ICD-10-CM | POA: Diagnosis not present

## 2014-12-01 DIAGNOSIS — M47816 Spondylosis without myelopathy or radiculopathy, lumbar region: Secondary | ICD-10-CM | POA: Diagnosis not present

## 2014-12-01 DIAGNOSIS — M4806 Spinal stenosis, lumbar region: Secondary | ICD-10-CM | POA: Diagnosis not present

## 2014-12-01 DIAGNOSIS — M545 Low back pain: Secondary | ICD-10-CM | POA: Diagnosis not present

## 2014-12-02 DIAGNOSIS — Z1389 Encounter for screening for other disorder: Secondary | ICD-10-CM | POA: Diagnosis not present

## 2014-12-02 DIAGNOSIS — I4891 Unspecified atrial fibrillation: Secondary | ICD-10-CM | POA: Diagnosis not present

## 2014-12-02 DIAGNOSIS — E782 Mixed hyperlipidemia: Secondary | ICD-10-CM | POA: Diagnosis not present

## 2014-12-02 DIAGNOSIS — K219 Gastro-esophageal reflux disease without esophagitis: Secondary | ICD-10-CM | POA: Diagnosis not present

## 2014-12-02 DIAGNOSIS — Z23 Encounter for immunization: Secondary | ICD-10-CM | POA: Diagnosis not present

## 2014-12-02 DIAGNOSIS — I10 Essential (primary) hypertension: Secondary | ICD-10-CM | POA: Diagnosis not present

## 2014-12-04 DIAGNOSIS — M47816 Spondylosis without myelopathy or radiculopathy, lumbar region: Secondary | ICD-10-CM | POA: Diagnosis not present

## 2014-12-04 DIAGNOSIS — M4806 Spinal stenosis, lumbar region: Secondary | ICD-10-CM | POA: Diagnosis not present

## 2014-12-11 ENCOUNTER — Ambulatory Visit (HOSPITAL_COMMUNITY): Payer: Medicare Other | Attending: Physical Medicine and Rehabilitation

## 2014-12-11 DIAGNOSIS — R498 Other voice and resonance disorders: Secondary | ICD-10-CM | POA: Diagnosis not present

## 2014-12-11 DIAGNOSIS — Z658 Other specified problems related to psychosocial circumstances: Secondary | ICD-10-CM | POA: Insufficient documentation

## 2014-12-11 DIAGNOSIS — R251 Tremor, unspecified: Secondary | ICD-10-CM | POA: Diagnosis not present

## 2014-12-11 DIAGNOSIS — Z789 Other specified health status: Secondary | ICD-10-CM

## 2014-12-11 DIAGNOSIS — R6889 Other general symptoms and signs: Secondary | ICD-10-CM

## 2014-12-11 DIAGNOSIS — R2689 Other abnormalities of gait and mobility: Secondary | ICD-10-CM | POA: Insufficient documentation

## 2014-12-11 DIAGNOSIS — M62830 Muscle spasm of back: Secondary | ICD-10-CM | POA: Diagnosis not present

## 2014-12-11 DIAGNOSIS — M5442 Lumbago with sciatica, left side: Secondary | ICD-10-CM | POA: Diagnosis not present

## 2014-12-11 DIAGNOSIS — M25552 Pain in left hip: Secondary | ICD-10-CM

## 2014-12-11 DIAGNOSIS — R2681 Unsteadiness on feet: Secondary | ICD-10-CM | POA: Insufficient documentation

## 2014-12-11 NOTE — Therapy (Signed)
[place], [place], [address] Phone: [phone]   Fax:  [phone]  Physical Therapy Evaluation  Patient Details  Name: Abigail Taylor MRN: [id number] Date of Birth: 1931/07/20 Referring Provider: Ron Agee  Encounter Date: 12/11/2014      PT End of Session - 12/11/14 1611    Visit Number 1   Number of Visits 15   Date for PT Re-Evaluation 02/10/15   Authorization Type Medicare    Authorization Time Period 12/11/14-02/10/15   Authorization - Visit Number 1   Authorization - Number of Visits 10   PT Start Time [id number]   PT Stop Time 1540   PT Time Calculation (min) 62 min   Activity Tolerance Patient tolerated treatment well;No increased pain  complete cessation of pain with walking and standing.    Behavior During Therapy [facility] for tasks assessed/performed;Flat affect      Past Medical History  Diagnosis Date  . Hypothyroidism   . Hyperlipidemia   . Angina at rest, with the tachycardia 07/05/2011  . Aortic insufficiency 07/05/2011  . Constipation 12/27/2011  . Atrial fibrillation   . Brady-tachy syndrome     Dual chamber Medtronic pacemaker Adapta  . Pacemaker   . OA (osteoarthritis)     Past Surgical History  Procedure Laterality Date  . Permanent pacemaker insertion  07/05/2011    Medtronic Adapta dual chamber  . Cardiac catheterization  05/26/1999    normal  . Nm myoview ltd  07/19/2011    normal  . Colonoscopy N/A 11/15/2012    Procedure: COLONOSCOPY;  Surgeon: [name], MD;  [facility];  Service: Endoscopy;  Laterality: N/A;  1030  . Permanent pacemaker insertion N/A 07/05/2011    Procedure: PERMANENT PACEMAKER INSERTION;  Surgeon: [name], MD;  Location: [facility] CATH LAB;  Service: Cardiovascular;  Laterality: N/A;  . Insert / replace / remove pacemaker    . Abdominal hysterectomy      There were no vitals filed for this visit.  Visit Diagnosis:  Left-sided low back pain  with left-sided sciatica - Plan: PT plan of care cert/re-cert  Pain in joint, pelvic region and thigh, left - Plan: PT plan of care cert/re-cert  Spasm of back muscles - Plan: PT plan of care cert/re-cert  Decreased back mobility - Plan: PT plan of care cert/re-cert  Hypophonia - Plan: PT plan of care cert/re-cert  Tremor - Plan: PT plan of care cert/re-cert  Unsteadiness on feet - Plan: PT plan of care cert/re-cert  Difficulty navigating stairs - Plan: PT plan of care cert/re-cert      Subjective Assessment - 12/11/14 1608    Subjective Pt reports back pain for more than 30 years, with most activity. Finally worsened enough to seek medical attention in January.  Has had injections, some of which were helpful. Reporting 5/10 pain in L PSIS. Resolves with sitting. Has sciatic symptoms into posterior sciatic nn, distribution. Walking causes repeated shooting sciatic pain. Occasional pain with lying down, but able to find a comfortable position. Pt also reporting a new diagnosis of PD within the last year.    Limitations Sitting;Walking   How long can you stand comfortably? not sure, maybe about 25-20 minutes.    How long can you walk comfortably? 5 minutes   Patient Stated Goals resolve pain, improve ability to walk.    Currently in Pain? Yes   Pain Score 5    Pain Location Pelvis   Pain  Orientation Left;Posterior  L PSIS    Pain Type Chronic pain   Aggravating Factors  standing/walking   Pain Relieving Factors sitting   Multiple Pain Sites Yes  shooting sciatic nerve pain  along the posteror thigh to the mid calf.             [facility] PT Assessment - 12/11/14 0001    Assessment   Medical Diagnosis Left LBP with sciatica    Referring Provider Ibazebo   Onset Date/Surgical Date 12/04/11   Hand Dominance Right   Next MD Visit 12/23/14   Prior Therapy None, just injections.    Precautions   Precautions None   Restrictions   Weight Bearing Restrictions No   Balance Screen    Has the patient fallen in the past 6 months No   Has the patient had a decrease in activity level because of a fear of falling?  No   Is the patient reluctant to leave their home because of a fear of falling?  No   Prior Function   Level of Independence Independent  husband helps with groceries sometimes.    Sensation   Light Touch Appears Intact   Sit to Stand   Comments 5x sit to stand: 11.92s   Strength   Overall Strength Within functional limits for tasks performed  5/5 throughout.        Posture: Stands on bent knees, lumbar and thoracic spine WNL, mild forward head posture.   Functional Mobility: Lying in prone, turning to supine, all very limited by weakness in arms and trunk. Able to perform modified indep for additional time.   Strength: All LE strength 5/5, non-provocative. Prone hip extension 4/5 bilat.   Muscle Length: Tight rectus femoris bilat (Ely'sTest)   Soft Tissue Assessment: Rigid hypertonicity in all muscles in proximity to the lumbar spine (paraspinals and QL)   Joint ROM:  Hips bilat prone WNL (+) Ely's test bilat   Special Tests: All pelvic provocation tests for pelvic instability and SI joint involvement are negative.             [place] Adult PT Treatment/Exercise - 12/11/14 0001    Exercises   Exercises Lumbar;Knee/Hip   Lumbar Exercises: Stretches   Single Knee to Chest Stretch 3 reps;30 seconds  bilateral   Knee/Hip Exercises: Stretches   Other Knee/Hip Stretches supin ehigh adductor stretch, 10lb weights on knees.   5 miutes   Manual Therapy   Soft tissue mobilization sciatic nerve gliding with fricitonal massage along the distribution along the hamstrings.    Myofascial Release 10 minutes: Lumbar paraspinals, QL bilat  R piriformis                PT Education - 12/11/14 1610    Education provided Yes   Education Details Explained the her prognosis based upon finding of today.    Person(s) Educated Patient   Methods  Explanation   Comprehension Verbalized understanding          PT Short Term Goals - 12/11/14 1622    PT SHORT TERM GOAL #1   Title By week two patient will demonstrate indep in an entry level HEP performing at least 4-6 days weeklty.    Status New   PT SHORT TERM GOAL #2   Title By week three patient will ambulate 458feet in 2 minutes to demonstrate safe limited community distances.    Status New   PT SHORT TERM GOAL #3   Title By week 3, patient  will demonstrate the ability to perform standing at home for 1 hour or greater free of pain or symptoms to improve ability to performing housework and  kitchen activities.   Status New           PT Long Term Goals - Dec 21, 2014 1627    PT LONG TERM GOAL #1   Title By the end of week 5, pt will demonstrate independence in an advanced HEP to continue progress after DC from therapy.    Status New   PT LONG TERM GOAL #2   Title By the end of 5 weeks, patient will tolerate a six miute walk test, covering 1426ft with less than 3/10 pain to demonstrate ability to safely perform  IADL.    Status New   PT LONG TERM GOAL #3   Title By week five pt will improve functional stregth as evidenced by 5x sit to stand in les than 8 seconds.    Status New               Plan - 2014/12/21 1614    Clinical Impression Statement Pt reporting chronic history of L posterior pelvis pain with intermittent L shooting sciatica to the mid calf. Strength and sensation are WFL bilat, no asymmetrical deviation. Posture is moderately aberrant with some innominate rotation, and significant rigidity in the lumbar spine, and firm hypertonic lumbar paraspinals and quadratus lumborum. All pelvic provocation tests for assessment of SI joint involvement are negative. Pt responding well to manual stretching to hip rotators, high adductors, and myofascial release of the lumbar musculature.  Additional soft tissue palpation is inconclusive, with mild hypertonicity of the L  piriformis, but otherwise unremarkable.    Pt will benefit from skilled therapeutic intervention in order to improve on the following deficits Abnormal gait;Decreased activity tolerance;Decreased range of motion;Decreased mobility;Difficulty walking;Impaired flexibility;Improper body mechanics;Postural dysfunction;Pain   Rehab Potential Good   Clinical Impairments Affecting Rehab Potential chronicity of current condition.    PT Frequency 2x / week   PT Duration --  5 weeks   PT Treatment/Interventions Moist Heat;Therapeutic exercise;Therapeutic activities;Gait training;Functional mobility training;Patient/family education;Manual techniques   PT Next Visit Plan get feedback about lasting effects of soft tissue work. Repeat SKTC, begin seated rotation stretch, frictional massage along the L sciatic distributuion.    PT Home Exercise Plan TBT next visit.    Consulted and Agree with Plan of Care Patient          G-Codes - December 21, 2014 1633    Functional Assessment Tool Used FOTO: 53   Functional Limitation Mobility: Walking and moving around   Mobility: Walking and Moving Around Current Status [phone]) At least 40 percent but less than 60 percent impaired, limited or restricted   Mobility: Walking and Moving Around Goal Status [phone]) At least 40 percent but less than 60 percent impaired, limited or restricted       Problem List Patient Active Problem List   Diagnosis Date Noted  . Normal coronary arteries 2001 05/08/2014  . Atrial fibrillation with RVR ([place]) 05/07/2014  . Pacemaker 08/10/2012  . Aortic insufficiency 08/10/2012  . Constipation 12/27/2011  . Family hx of colon cancer 12/27/2011  . Elevated troponin, trace positive secondary to rapid AF 07/06/2011  . Hypothyroidism 07/05/2011  . Paroxysmal atrial fibrillation ([place]) 07/05/2011  . Sinus pause, 7 seconds post conversion, S/P MDT pacemaker 07/05/11 07/05/2011  . Hypercholesterolemia 07/05/2011  . Angina at rest, with rapid AF  07/05/2011    [place] Fanfan C 12/21/14, 4:40 PM  4:40 PM  Etta Grandchild, PT, DPT [place] License # [id number]       [facility] [address] [place], [place], [address] Phone: [phone]   Fax:  [phone]  Name: Abigail Taylor MRN: [id number] Date of Birth: 1931-02-07

## 2014-12-12 ENCOUNTER — Ambulatory Visit (HOSPITAL_COMMUNITY): Payer: Medicare Other

## 2014-12-12 DIAGNOSIS — M62830 Muscle spasm of back: Secondary | ICD-10-CM | POA: Diagnosis not present

## 2014-12-12 DIAGNOSIS — M25552 Pain in left hip: Secondary | ICD-10-CM | POA: Diagnosis not present

## 2014-12-12 DIAGNOSIS — M5442 Lumbago with sciatica, left side: Secondary | ICD-10-CM

## 2014-12-12 DIAGNOSIS — R498 Other voice and resonance disorders: Secondary | ICD-10-CM

## 2014-12-12 DIAGNOSIS — R6889 Other general symptoms and signs: Secondary | ICD-10-CM

## 2014-12-12 DIAGNOSIS — R251 Tremor, unspecified: Secondary | ICD-10-CM | POA: Diagnosis not present

## 2014-12-12 DIAGNOSIS — Z789 Other specified health status: Secondary | ICD-10-CM

## 2014-12-12 DIAGNOSIS — R2689 Other abnormalities of gait and mobility: Secondary | ICD-10-CM | POA: Diagnosis not present

## 2014-12-12 DIAGNOSIS — R2681 Unsteadiness on feet: Secondary | ICD-10-CM

## 2014-12-12 NOTE — Therapy (Signed)
[place], [place], [address] Phone: [phone]   Fax:  [phone]  Physical Therapy Treatment  Patient Details  Name: Abigail Taylor MRN: [id number] Date of Birth: 10-Jun-1931 Referring Provider: Ron Agee  Encounter Date: 12/12/2014      PT End of Session - 12/12/14 1016    Visit Number 2   Number of Visits 15   Date for PT Re-Evaluation 02/10/15   Authorization Type Medicare    Authorization Time Period 12/11/14-02/10/15   Authorization - Visit Number 2   Authorization - Number of Visits 10   PT Start Time 0936   PT Stop Time 1016   PT Time Calculation (min) 40 min   Activity Tolerance Patient tolerated treatment well;No increased pain   Behavior During Therapy [facility] for tasks assessed/performed      Past Medical History  Diagnosis Date  . Hypothyroidism   . Hyperlipidemia   . Angina at rest, with the tachycardia 07/05/2011  . Aortic insufficiency 07/05/2011  . Constipation 12/27/2011  . Atrial fibrillation   . Brady-tachy syndrome     Dual chamber Medtronic pacemaker Adapta  . Pacemaker   . OA (osteoarthritis)     Past Surgical History  Procedure Laterality Date  . Permanent pacemaker insertion  07/05/2011    Medtronic Adapta dual chamber  . Cardiac catheterization  05/26/1999    normal  . Nm myoview ltd  07/19/2011    normal  . Colonoscopy N/A 11/15/2012    Procedure: COLONOSCOPY;  Surgeon: [name], MD;  [facility];  Service: Endoscopy;  Laterality: N/A;  1030  . Permanent pacemaker insertion N/A 07/05/2011    Procedure: PERMANENT PACEMAKER INSERTION;  Surgeon: [name], MD;  Location: [facility] CATH LAB;  Service: Cardiovascular;  Laterality: N/A;  . Insert / replace / remove pacemaker    . Abdominal hysterectomy      There were no vitals filed for this visit.  Visit Diagnosis:  Left-sided low back pain with left-sided sciatica  Pain in joint, pelvic region and thigh,  left  Spasm of back muscles  Decreased back mobility  Hypophonia  Tremor  Unsteadiness on feet  Difficulty navigating stairs      Subjective Assessment - 12/12/14 0945    Subjective Pt reports relief following massage last session with ability to walk short distance and stand for increased time without pain.    Pertinent History Pt reports back pain for more than 30 years, with most activity. Finally worsened enough to seek medical attention in January.  Has had injections, some of which were helpful. Reporting 5/10 pain in L PSIS. Resolves with sitting. Has sciatic symptoms into posterior sciatic nn, distribution. Walking causes repeated shooting sciatic pain. Occasional pain with lying down, but able to find a comfortable position. Pt also reporting a new diagnosis of PD within the last year.    Currently in Pain? Yes   Pain Score 2    Pain Location Back   Pain Orientation Posterior;Left;Lower   Pain Descriptors / Indicators Aching           [facility] Adult PT Treatment/Exercise - 12/12/14 0001    Lumbar Exercises: Stretches   Active Hamstring Stretch 3 reps;30 seconds   Active Hamstring Stretch Limitations supine with rope   Single Knee to Chest Stretch 3 reps;30 seconds   Lower Trunk Rotation 10 seconds   Lower Trunk Rotation Limitations 10 reps x 10"   Lumbar Exercises: Supine  Bridge 10 reps   Manual Therapy   Manual Therapy Myofascial release;Soft tissue mobilization   Soft tissue mobilization Paraspinals, QL Bil           PT Education - 12/11/14 1610    Education provided Yes   Education Details Explained the her prognosis based upon finding of today.    Person(s) Educated Patient   Methods Explanation   Comprehension Verbalized understanding          PT Short Term Goals - 12/11/14 1622    PT SHORT TERM GOAL #1   Title By week two patient will demonstrate indep in an entry level HEP performing at least 4-6 days weeklty.    Status New   PT SHORT TERM  GOAL #2   Title By week three patient will ambulate 43feet in 2 minutes to demonstrate safe limited community distances.    Status New   PT SHORT TERM GOAL #3   Title By week 3, patient will demonstrate the ability to perform standing at home for 1 hour or greater free of pain or symptoms to improve ability to performing housework and  kitchen activities.   Status New           PT Long Term Goals - 12/11/14 1627    PT LONG TERM GOAL #1   Title By the end of week 5, pt will demonstrate independence in an advanced HEP to continue progress after DC from therapy.    Status New   PT LONG TERM GOAL #2   Title By the end of 5 weeks, patient will tolerate a six miute walk test, covering 1446ft with less than 3/10 pain to demonstrate ability to safely perform  IADL.    Status New   PT LONG TERM GOAL #3   Title By week five pt will improve functional stregth as evidenced by 5x sit to stand in les than 8 seconds.    Status New               Plan - 12/12/14 1032    Clinical Impression Statement Reviewed goals, established HEP and copy of eval given to pt.  Session focus on stretches and mobilty exercises for lumbar spine and sacral region.  Ended session with manual soft tissue techniques to reduce tightness lumbar and quadratus lumborum area, able to reduce but unable to fully resolve.  Pt reported pain free at end of session.   PT Next Visit Plan Next session continue with lumbar mobilty (SKTC, LTR, bridges), begin seated rotation stretchs and continue manual soft tissue techniques with frictional massage along the Lt sciatic distribution.   PT Home Exercise Plan Given this session ([place], LTR, bridges)       Problem List Patient Active Problem List   Diagnosis Date Noted  . Normal coronary arteries 2001 05/08/2014  . Atrial fibrillation with RVR ([place]) 05/07/2014  . Pacemaker 08/10/2012  . Aortic insufficiency 08/10/2012  . Constipation 12/27/2011  . Family hx of colon cancer  12/27/2011  . Elevated troponin, trace positive secondary to rapid AF 07/06/2011  . Hypothyroidism 07/05/2011  . Paroxysmal atrial fibrillation ([place]) 07/05/2011  . Sinus pause, 7 seconds post conversion, S/P MDT pacemaker 07/05/11 07/05/2011  . Hypercholesterolemia 07/05/2011  . Angina at rest, with rapid AF 07/05/2011   Ihor Austin, [place]; CBIS [phone]  Aldona Lento 12/12/2014, 11:39 AM  [place], [place], [address] Phone: [phone]   Fax:  [phone]  Name: Abigail Taylor MRN: [id number] Date of Birth: [birth year]

## 2014-12-12 NOTE — Patient Instructions (Signed)
Knee to Chest (Flexion)    Pull knee toward chest. Feel stretch in lower back or buttock area. Breathing deeply, Hold 30 seconds. Repeat with other knee. Repeat 3-5 times. Do 1-2 sessions per day.  http://gt2.exer.us/226   Copyright  VHI. All rights reserved.   Bridge    Lie back, legs bent. Inhale, pressing hips up. Keeping ribs in, lengthen lower back. Exhale, rolling down along spine from top. Repeat 10-20 times. Do 1-2 sessions per day.  http://pm.exer.us/55   Copyright  VHI. All rights reserved.   Knee Roll    Lying on back, with knees bent and feet flat on floor, arms outstretched to sides, slowly roll both knees to side, hold 5 seconds. Back to starting position, hold 5 seconds. Then to opposite side, hold 5 seconds. Return to starting position. Keep shoulders and arms in contact with floor.   Copyright  VHI. All rights reserved.

## 2014-12-16 DIAGNOSIS — I739 Peripheral vascular disease, unspecified: Secondary | ICD-10-CM | POA: Diagnosis not present

## 2014-12-23 ENCOUNTER — Ambulatory Visit (HOSPITAL_COMMUNITY): Payer: Medicare Other

## 2014-12-23 DIAGNOSIS — M545 Low back pain: Secondary | ICD-10-CM | POA: Diagnosis not present

## 2014-12-23 DIAGNOSIS — M5442 Lumbago with sciatica, left side: Secondary | ICD-10-CM

## 2014-12-23 DIAGNOSIS — R498 Other voice and resonance disorders: Secondary | ICD-10-CM | POA: Diagnosis not present

## 2014-12-23 DIAGNOSIS — R2689 Other abnormalities of gait and mobility: Secondary | ICD-10-CM

## 2014-12-23 DIAGNOSIS — M25552 Pain in left hip: Secondary | ICD-10-CM | POA: Diagnosis not present

## 2014-12-23 DIAGNOSIS — M4806 Spinal stenosis, lumbar region: Secondary | ICD-10-CM | POA: Diagnosis not present

## 2014-12-23 DIAGNOSIS — R251 Tremor, unspecified: Secondary | ICD-10-CM | POA: Diagnosis not present

## 2014-12-23 DIAGNOSIS — M47816 Spondylosis without myelopathy or radiculopathy, lumbar region: Secondary | ICD-10-CM | POA: Diagnosis not present

## 2014-12-23 DIAGNOSIS — M62830 Muscle spasm of back: Secondary | ICD-10-CM | POA: Diagnosis not present

## 2014-12-23 NOTE — Therapy (Signed)
[place], [place], [address] Phone: [phone]   Fax:  [phone]  Physical Therapy Treatment  Patient Details  Name: Abigail Taylor MRN: [id number] Date of Birth: 1932-01-18 Referring Provider: Ron Agee  Encounter Date: 12/23/2014      PT End of Session - 12/23/14 0943    Visit Number 3   Number of Visits 15   Date for PT Re-Evaluation 02/10/15   Authorization Type Medicare    Authorization Time Period 12/11/14-02/10/15   Authorization - Visit Number 3   Authorization - Number of Visits 10   PT Start Time 0936   PT Stop Time [id number]   PT Time Calculation (min) 39 min   Activity Tolerance Patient tolerated treatment well;No increased pain   Behavior During Therapy [facility] for tasks assessed/performed      Past Medical History  Diagnosis Date  . Hypothyroidism   . Hyperlipidemia   . Angina at rest, with the tachycardia 07/05/2011  . Aortic insufficiency 07/05/2011  . Constipation 12/27/2011  . Atrial fibrillation   . Brady-tachy syndrome     Dual chamber Medtronic pacemaker Adapta  . Pacemaker   . OA (osteoarthritis)     Past Surgical History  Procedure Laterality Date  . Permanent pacemaker insertion  07/05/2011    Medtronic Adapta dual chamber  . Cardiac catheterization  05/26/1999    normal  . Nm myoview ltd  07/19/2011    normal  . Colonoscopy N/A 11/15/2012    Procedure: COLONOSCOPY;  Surgeon: [name], MD;  [facility];  Service: Endoscopy;  Laterality: N/A;  1030  . Permanent pacemaker insertion N/A 07/05/2011    Procedure: PERMANENT PACEMAKER INSERTION;  Surgeon: [name], MD;  Location: [place] CATH LAB;  Service: Cardiovascular;  Laterality: N/A;  . Insert / replace / remove pacemaker    . Abdominal hysterectomy      There were no vitals filed for this visit.  Visit Diagnosis:  Left-sided low back pain with left-sided sciatica  Pain in joint, pelvic region and thigh,  left  Spasm of back muscles  Decreased back mobility      Subjective Assessment - 12/23/14 0942    Subjective Pt reported relief following massage last session, currently pain free.  Pt with new bruises on hand and arms, stated dog stepped on arm and caused bruise, on blood thinner medication.     Pertinent History Pt reports back pain for more than 30 years, with most activity. Finally worsened enough to seek medical attention in January.  Has had injections, some of which were helpful. Reporting 5/10 pain in L PSIS. Resolves with sitting. Has sciatic symptoms into posterior sciatic nn, distribution. Walking causes repeated shooting sciatic pain. Occasional pain with lying down, but able to find a comfortable position. Pt also reporting a new diagnosis of PD within the last year.    Currently in Pain? No/denies                         [facility] Adult PT Treatment/Exercise - 12/23/14 0001    Lumbar Exercises: Stretches   Active Hamstring Stretch 3 reps;30 seconds   Active Hamstring Stretch Limitations supine with rope   Single Knee to Chest Stretch 3 reps;30 seconds   Lower Trunk Rotation 10 seconds   Lower Trunk Rotation Limitations 10 reps x 10"   Piriformis Stretch 2 reps;30 seconds   Piriformis Stretch Limitations supine figure 4 with  towel   Lumbar Exercises: Supine   Bent Knee Raise 5 reps;5 seconds   Bridge 10 reps   Bridge Limitations 2x 10"   Knee/Hip Exercises: Standing   Gait Training 552 feet with dowel rods BUE; 78feet no assistance just cueing for arm swing and to increase stride length   Knee/Hip Exercises: Seated   Other Seated Knee/Hip Exercises Seated rotation with red ball                  PT Short Term Goals - 12/11/14 1622    PT SHORT TERM GOAL #1   Title By week two patient will demonstrate indep in an entry level HEP performing at least 4-6 days weeklty.    Status New   PT SHORT TERM GOAL #2   Title By week three patient will  ambulate 458feet in 2 minutes to demonstrate safe limited community distances.    Status New   PT SHORT TERM GOAL #3   Title By week 3, patient will demonstrate the ability to perform standing at home for 1 hour or greater free of pain or symptoms to improve ability to performing housework and  kitchen activities.   Status New           PT Long Term Goals - 12/11/14 1627    PT LONG TERM GOAL #1   Title By the end of week 5, pt will demonstrate independence in an advanced HEP to continue progress after DC from therapy.    Status New   PT LONG TERM GOAL #2   Title By the end of 5 weeks, patient will tolerate a six miute walk test, covering 1450ft with less than 3/10 pain to demonstrate ability to safely perform  IADL.    Status New   PT LONG TERM GOAL #3   Title By week five pt will improve functional stregth as evidenced by 5x sit to stand in les than 8 seconds.    Status New               Plan - 12/23/14 1023    Clinical Impression Statement Continued wtih lumbar mobilty exercises and added stretches/exercises to improve lumbar rotation.  No reports of pain through session and held manual per pt request.  Pt educationed on shuffle gait mechanics with Parksinson's disorder, gait training complete to improve mechanics with cueing for arm swing with opposite LE and to increase stride length.  Utilized dowel rods BUE with therapist assistance for proper sequence with arm swing with gait.  Pt able to demonstrate improved gait mechanics with minimal cueing required for arm swing.  No reports of pain through session.     PT Next Visit Plan Continue with lumbar mobilty exercises, seated and standing lumbar rotational stretches and exercises, gait training to improve arm swing and increase stride length, manual soft tissue technqiues with frictional massage along Lt sciatic distribution..  Begin standing 3D hip excursion next session to improve hip mobility and increase standing tolerance.           Problem List Patient Active Problem List   Diagnosis Date Noted  . Normal coronary arteries 2001 05/08/2014  . Atrial fibrillation with RVR ([place]) 05/07/2014  . Pacemaker 08/10/2012  . Aortic insufficiency 08/10/2012  . Constipation 12/27/2011  . Family hx of colon cancer 12/27/2011  . Elevated troponin, trace positive secondary to rapid AF 07/06/2011  . Hypothyroidism 07/05/2011  . Paroxysmal atrial fibrillation ([place]) 07/05/2011  . Sinus pause, 7 seconds post conversion,  S/P MDT pacemaker 07/05/11 07/05/2011  . Hypercholesterolemia 07/05/2011  . Angina at rest, with rapid AF 07/05/2011   Ihor Austin, [place]; CBIS [phone]  Aldona Lento 12/23/2014, 10:36 AM  [place], [place], [address] Phone: [phone]   Fax:  [phone]  Name: Abigail Taylor MRN: [id number] Date of Birth: Nov 15, 1931

## 2014-12-24 ENCOUNTER — Telehealth: Payer: Self-pay | Admitting: Cardiovascular Disease

## 2014-12-24 NOTE — Telephone Encounter (Signed)
Returned call to patient xarelto 20 mg samples left at front desk of [facility] office.

## 2014-12-24 NOTE — Telephone Encounter (Signed)
Patient calling the office for samples of medication:   1.  What medication and dosage are you requesting samples for?Xarelto 2.  Are you currently out of this medication? No,enough for this week

## 2014-12-25 ENCOUNTER — Ambulatory Visit (HOSPITAL_COMMUNITY): Payer: Medicare Other | Attending: Physical Medicine and Rehabilitation

## 2014-12-25 DIAGNOSIS — R2681 Unsteadiness on feet: Secondary | ICD-10-CM | POA: Insufficient documentation

## 2014-12-25 DIAGNOSIS — M5442 Lumbago with sciatica, left side: Secondary | ICD-10-CM | POA: Diagnosis not present

## 2014-12-25 DIAGNOSIS — R2689 Other abnormalities of gait and mobility: Secondary | ICD-10-CM | POA: Insufficient documentation

## 2014-12-25 DIAGNOSIS — R498 Other voice and resonance disorders: Secondary | ICD-10-CM | POA: Diagnosis not present

## 2014-12-25 DIAGNOSIS — M25552 Pain in left hip: Secondary | ICD-10-CM

## 2014-12-25 DIAGNOSIS — R251 Tremor, unspecified: Secondary | ICD-10-CM | POA: Insufficient documentation

## 2014-12-25 DIAGNOSIS — Z658 Other specified problems related to psychosocial circumstances: Secondary | ICD-10-CM | POA: Diagnosis not present

## 2014-12-25 DIAGNOSIS — M62830 Muscle spasm of back: Secondary | ICD-10-CM | POA: Insufficient documentation

## 2014-12-25 NOTE — Therapy (Signed)
[place], [place], [address] Phone: [phone]   Fax:  [phone]  Physical Therapy Treatment  Patient Details  Name: Abigail Taylor MRN: [id number] Date of Birth: [birth year] Referring Provider: Ron Agee  Encounter Date: 12/25/2014      PT End of Session - 12/25/14 0908    Visit Number 4   Number of Visits 16   Date for PT Re-Evaluation 02/10/15   Authorization Type Medicare    Authorization Time Period 12/11/14-02/10/15   Authorization - Visit Number 4   Authorization - Number of Visits 10   PT Start Time 0851   PT Stop Time 0930   PT Time Calculation (min) 39 min   Activity Tolerance Patient tolerated treatment well;No increased pain   Behavior During Therapy [facility] for tasks assessed/performed      Past Medical History  Diagnosis Date  . Hypothyroidism   . Hyperlipidemia   . Angina at rest, with the tachycardia 07/05/2011  . Aortic insufficiency 07/05/2011  . Constipation 12/27/2011  . Atrial fibrillation   . Brady-tachy syndrome     Dual chamber Medtronic pacemaker Adapta  . Pacemaker   . OA (osteoarthritis)     Past Surgical History  Procedure Laterality Date  . Permanent pacemaker insertion  07/05/2011    Medtronic Adapta dual chamber  . Cardiac catheterization  05/26/1999    normal  . Nm myoview ltd  07/19/2011    normal  . Colonoscopy N/A 11/15/2012    Procedure: COLONOSCOPY;  Surgeon: [name], MD;  [facility];  Service: Endoscopy;  Laterality: N/A;  1030  . Permanent pacemaker insertion N/A 07/05/2011    Procedure: PERMANENT PACEMAKER INSERTION;  Surgeon: [name], MD;  Location: [facility] CATH LAB;  Service: Cardiovascular;  Laterality: N/A;  . Insert / replace / remove pacemaker    . Abdominal hysterectomy      There were no vitals filed for this visit.  Visit Diagnosis:  Left-sided low back pain with left-sided sciatica  Pain in joint, pelvic region and thigh,  left  Spasm of back muscles  Decreased back mobility      Subjective Assessment - 12/25/14 0903    Subjective Pt stated she is pain free today and has increeased walking in her long driveway, has been working on arm swing with gait.     Currently in Pain? No/denies             [facility] Adult PT Treatment/Exercise - 12/25/14 0001    Lumbar Exercises: Stretches   Active Hamstring Stretch 3 reps;30 seconds   Active Hamstring Stretch Limitations supine with rope   Single Knee to Chest Stretch 3 reps;30 seconds   Lower Trunk Rotation 10 seconds   Lower Trunk Rotation Limitations 10 reps x 10"   Piriformis Stretch 2 reps;30 seconds   Piriformis Stretch Limitations supine figure 4 with towel   Lumbar Exercises: Standing   Functional Squats 10 reps   Functional Squats Limitations 3D hip excursion   Lumbar Exercises: Supine   Bent Knee Raise 10 reps;5 seconds   Bridge 15 reps   Bridge Limitations 2x 15   Straight Leg Raise 10 reps   Lumbar Exercises: Sidelying   Hip Abduction 10 reps   Knee/Hip Exercises: Standing   Gait Training 226 feet with proper gait mechanics and arm swing following initial cueing           PT Short Term Goals - 12/11/14 1622  PT SHORT TERM GOAL #1   Title By week two patient will demonstrate indep in an entry level HEP performing at least 4-6 days weeklty.    Status New   PT SHORT TERM GOAL #2   Title By week three patient will ambulate 458feet in 2 minutes to demonstrate safe limited community distances.    Status New   PT SHORT TERM GOAL #3   Title By week 3, patient will demonstrate the ability to perform standing at home for 1 hour or greater free of pain or symptoms to improve ability to performing housework and  kitchen activities.   Status New           PT Long Term Goals - 12/11/14 1627    PT LONG TERM GOAL #1   Title By the end of week 5, pt will demonstrate independence in an advanced HEP to continue progress after DC from  therapy.    Status New   PT LONG TERM GOAL #2   Title By the end of 5 weeks, patient will tolerate a six miute walk test, covering 1428ft with less than 3/10 pain to demonstrate ability to safely perform  IADL.    Status New   PT LONG TERM GOAL #3   Title By week five pt will improve functional stregth as evidenced by 5x sit to stand in les than 8 seconds.    Status New               Plan - 12/25/14 0908    Clinical Impression Statement Pt improving gait mechanics with minimal cueing for arm swing initially this session with abiliity to demonostrate improved sequence and good stride length.  Added 3D hip excursion to improve hip mobilty, with min cueing required for form and technique with new exercise utilized mat surface for proper form with squats.  Improved form noted with mat exercises and able to increase reps for activity tolerance and strengthneing.  No reports of pain through session.     PT Next Visit Plan Continue with lumbar mobilty exercises, seated and standing lumbar rotational stretches and exercises, gait training to improve arm swing and increase stride length, manual soft tissue technqiues with frictional massage along Lt sciatic distribution..  Progress standing exercises with heel/toe raises and forward lunges for functional strengthening and increase standing tolerance.          Problem List Patient Active Problem List   Diagnosis Date Noted  . Normal coronary arteries 2001 05/08/2014  . Atrial fibrillation with RVR ([place]) 05/07/2014  . Pacemaker 08/10/2012  . Aortic insufficiency 08/10/2012  . Constipation 12/27/2011  . Family hx of colon cancer 12/27/2011  . Elevated troponin, trace positive secondary to rapid AF 07/06/2011  . Hypothyroidism 07/05/2011  . Paroxysmal atrial fibrillation ([place]) 07/05/2011  . Sinus pause, 7 seconds post conversion, S/P MDT pacemaker 07/05/11 07/05/2011  . Hypercholesterolemia 07/05/2011  . Angina at rest, with rapid AF  07/05/2011   Ihor Austin, [place]; [facility] [phone]  Aldona Lento 12/25/2014, 10:36 AM  [place], [place], [address] Phone: [phone]   Fax:  [phone]  Name: Abigail Taylor MRN: [id number] Date of Birth: [birth year]

## 2014-12-30 ENCOUNTER — Ambulatory Visit (HOSPITAL_COMMUNITY): Payer: Medicare Other

## 2014-12-30 DIAGNOSIS — Z789 Other specified health status: Secondary | ICD-10-CM

## 2014-12-30 DIAGNOSIS — R498 Other voice and resonance disorders: Secondary | ICD-10-CM | POA: Diagnosis not present

## 2014-12-30 DIAGNOSIS — R2681 Unsteadiness on feet: Secondary | ICD-10-CM

## 2014-12-30 DIAGNOSIS — M25552 Pain in left hip: Secondary | ICD-10-CM

## 2014-12-30 DIAGNOSIS — M5442 Lumbago with sciatica, left side: Secondary | ICD-10-CM

## 2014-12-30 DIAGNOSIS — M62830 Muscle spasm of back: Secondary | ICD-10-CM | POA: Diagnosis not present

## 2014-12-30 DIAGNOSIS — R6889 Other general symptoms and signs: Secondary | ICD-10-CM

## 2014-12-30 DIAGNOSIS — R2689 Other abnormalities of gait and mobility: Secondary | ICD-10-CM

## 2014-12-30 NOTE — Therapy (Signed)
[place], [place], [address] Phone: [phone]   Fax:  [phone]  Physical Therapy Treatment  Patient Details  Name: Abigail Taylor MRN: [id number] Date of Birth: Feb 18, 1931 Referring Provider: Ron Agee  Encounter Date: 12/30/2014      PT End of Session - 12/30/14 0907    Visit Number 5   Number of Visits 16   Date for PT Re-Evaluation 02/10/15   Authorization Type Medicare    Authorization Time Period 12/11/14-02/10/15   Authorization - Visit Number 5   Authorization - Number of Visits 10   PT Start Time [id number]   PT Stop Time 0930   PT Time Calculation (min) 43 min   Activity Tolerance Patient tolerated treatment well;No increased pain   Behavior During Therapy [facility] for tasks assessed/performed      Past Medical History  Diagnosis Date  . Hypothyroidism   . Hyperlipidemia   . Angina at rest, with the tachycardia 07/05/2011  . Aortic insufficiency 07/05/2011  . Constipation 12/27/2011  . Atrial fibrillation   . Brady-tachy syndrome     Dual chamber Medtronic pacemaker Adapta  . Pacemaker   . OA (osteoarthritis)     Past Surgical History  Procedure Laterality Date  . Permanent pacemaker insertion  07/05/2011    Medtronic Adapta dual chamber  . Cardiac catheterization  05/26/1999    normal  . Nm myoview ltd  07/19/2011    normal  . Colonoscopy N/A 11/15/2012    Procedure: COLONOSCOPY;  Surgeon: [name], MD;  [facility];  Service: Endoscopy;  Laterality: N/A;  1030  . Permanent pacemaker insertion N/A 07/05/2011    Procedure: PERMANENT PACEMAKER INSERTION;  Surgeon: [name], MD;  Location: [facility] CATH LAB;  Service: Cardiovascular;  Laterality: N/A;  . Insert / replace / remove pacemaker    . Abdominal hysterectomy      There were no vitals filed for this visit.  Visit Diagnosis:  Left-sided low back pain with left-sided sciatica  Pain in joint, pelvic region and thigh,  left  Spasm of back muscles  Decreased back mobility  Unsteadiness on feet  Difficulty navigating stairs      Subjective Assessment - 12/30/14 0847    Subjective Pt stated she had some pain over weekend on lower back and arms, currently pain free upon arrival to therapy today.  Reports she has been doing her exercises and walking daily   Patient Stated Goals resolve pain, improve ability to walk.    Currently in Pain? No/denies            [facility] Adult PT Treatment/Exercise - 12/30/14 0001    Lumbar Exercises: Stretches   Active Hamstring Stretch 3 reps;30 seconds   Active Hamstring Stretch Limitations supine with rope   Lower Trunk Rotation 10 seconds   Lower Trunk Rotation Limitations 10 reps x 10"   Lumbar Exercises: Standing   Heel Raises 10 reps   Heel Raises Limitations Toe raises   Functional Squats 10 reps   Functional Squats Limitations 3D hip excursion   Forward Lunge 5 reps;Limitations   Forward Lunge Limitations 5x each LE on 6in step   Lumbar Exercises: Supine   Bridge 15 reps   Straight Leg Raise 10 reps   Lumbar Exercises: Sidelying   Hip Abduction 15 reps   Knee/Hip Exercises: Standing   Gait Training 226 focus on improving trunk rotation and arm swing   Knee/Hip Exercises: Seated  Other Seated Knee/Hip Exercises Sitting on dynadisc cone rotation                  PT Short Term Goals - 12/11/14 1622    PT SHORT TERM GOAL #1   Title By week two patient will demonstrate indep in an entry level HEP performing at least 4-6 days weeklty.    Status New   PT SHORT TERM GOAL #2   Title By week three patient will ambulate 472feet in 2 minutes to demonstrate safe limited community distances.    Status New   PT SHORT TERM GOAL #3   Title By week 3, patient will demonstrate the ability to perform standing at home for 1 hour or greater free of pain or symptoms to improve ability to performing housework and  kitchen activities.   Status New            PT Long Term Goals - 12/11/14 1627    PT LONG TERM GOAL #1   Title By the end of week 5, pt will demonstrate independence in an advanced HEP to continue progress after DC from therapy.    Status New   PT LONG TERM GOAL #2   Title By the end of 5 weeks, patient will tolerate a six miute walk test, covering 1453ft with less than 3/10 pain to demonstrate ability to safely perform  IADL.    Status New   PT LONG TERM GOAL #3   Title By week five pt will improve functional stregth as evidenced by 5x sit to stand in les than 8 seconds.    Status New               Plan - 12/30/14 [id number]    Clinical Impression Statement Continues session focus on improving lumbar and hip mobilty to improve gait mechanics and reduce pain.  Pt demonstrated appropriate arm swing with gait mechanics without cueing required, therapist facilitation to improve trunk rotation with gait.  Added trunk rotation sitting on dynadisc without UE or LE assistance to improve core strengthening and trunk rotation as well as standing heel/toe raises and forward lunges for functional strengthening and to increase standing tolerance.  Therapist facilitatoin for proper form and technqiue with all new exercises.  No reports of pain through session.     PT Next Visit Plan Continue with lumbar mobility stretches and strengthening exercises, gait training to improve trunk rotation and appropriate arm swing, Manual PRN.  Next session add standing on Airex cone rotation and continue sitting rotation exercises.          Problem List Patient Active Problem List   Diagnosis Date Noted  . Normal coronary arteries 2001 05/08/2014  . Atrial fibrillation with RVR ([place]) 05/07/2014  . Pacemaker 08/10/2012  . Aortic insufficiency 08/10/2012  . Constipation 12/27/2011  . Family hx of colon cancer 12/27/2011  . Elevated troponin, trace positive secondary to rapid AF 07/06/2011  . Hypothyroidism 07/05/2011  . Paroxysmal atrial  fibrillation ([place]) 07/05/2011  . Sinus pause, 7 seconds post conversion, S/P MDT pacemaker 07/05/11 07/05/2011  . Hypercholesterolemia 07/05/2011  . Angina at rest, with rapid AF 07/05/2011   Ihor Austin, [place]; [place]  Aldona Lento 12/30/2014, 1:11 PM  [place], [place], [address] Phone: [phone]   Fax:  [phone]  Name: Abigail Taylor MRN: [id number] Date of Birth: March 08, 1931

## 2014-12-31 ENCOUNTER — Telehealth: Payer: Self-pay | Admitting: Cardiovascular Disease

## 2014-12-31 NOTE — Telephone Encounter (Signed)
New Message      Pt calling wanting to know how to do remote transmission. Please call back.

## 2014-12-31 NOTE — Telephone Encounter (Signed)
Spoke w/ pt and she informed me that the orange light was blinking. I informed her that the light indicated that she needed new batteries. Pt verbalized understanding. She is going to get new batteries and try again later.

## 2015-01-01 ENCOUNTER — Ambulatory Visit (HOSPITAL_COMMUNITY): Payer: Medicare Other

## 2015-01-01 DIAGNOSIS — R498 Other voice and resonance disorders: Secondary | ICD-10-CM | POA: Diagnosis not present

## 2015-01-01 DIAGNOSIS — R2689 Other abnormalities of gait and mobility: Secondary | ICD-10-CM

## 2015-01-01 DIAGNOSIS — M25552 Pain in left hip: Secondary | ICD-10-CM | POA: Diagnosis not present

## 2015-01-01 DIAGNOSIS — R2681 Unsteadiness on feet: Secondary | ICD-10-CM | POA: Diagnosis not present

## 2015-01-01 DIAGNOSIS — M5442 Lumbago with sciatica, left side: Secondary | ICD-10-CM | POA: Diagnosis not present

## 2015-01-01 DIAGNOSIS — M62830 Muscle spasm of back: Secondary | ICD-10-CM | POA: Diagnosis not present

## 2015-01-01 DIAGNOSIS — R251 Tremor, unspecified: Secondary | ICD-10-CM

## 2015-01-01 DIAGNOSIS — Z789 Other specified health status: Secondary | ICD-10-CM

## 2015-01-01 DIAGNOSIS — R6889 Other general symptoms and signs: Secondary | ICD-10-CM

## 2015-01-01 NOTE — Therapy (Signed)
[place], [place], [address] Phone: [phone]   Fax:  [phone]  Physical Therapy Treatment  Patient Details  Name: Abigail Taylor MRN: [id number] Date of Birth: 07/05/1931 Referring Provider: Ron Agee  Encounter Date: 01/01/2015      PT End of Session - 01/01/15 0910    Visit Number 6   Number of Visits 16   Date for PT Re-Evaluation 02/10/15   Authorization Type Medicare    Authorization Time Period 12/11/14-02/10/15   Authorization - Visit Number 6   Authorization - Number of Visits 10   PT Start Time 0848   PT Stop Time 0926   PT Time Calculation (min) 38 min   Activity Tolerance Patient tolerated treatment well  bridges make back feel good   Behavior During Therapy [facility] for tasks assessed/performed      Past Medical History  Diagnosis Date  . Hypothyroidism   . Hyperlipidemia   . Angina at rest, with the tachycardia 07/05/2011  . Aortic insufficiency 07/05/2011  . Constipation 12/27/2011  . Atrial fibrillation   . Brady-tachy syndrome     Dual chamber Medtronic pacemaker Adapta  . Pacemaker   . OA (osteoarthritis)     Past Surgical History  Procedure Laterality Date  . Permanent pacemaker insertion  07/05/2011    Medtronic Adapta dual chamber  . Cardiac catheterization  05/26/1999    normal  . Nm myoview ltd  07/19/2011    normal  . Colonoscopy N/A 11/15/2012    Procedure: COLONOSCOPY;  Surgeon: [name], MD;  [facility];  Service: Endoscopy;  Laterality: N/A;  1030  . Permanent pacemaker insertion N/A 07/05/2011    Procedure: PERMANENT PACEMAKER INSERTION;  Surgeon: [name], MD;  Location: [facility] CATH LAB;  Service: Cardiovascular;  Laterality: N/A;  . Insert / replace / remove pacemaker    . Abdominal hysterectomy      There were no vitals filed for this visit.  Visit Diagnosis:  Left-sided low back pain with left-sided sciatica  Pain in joint, pelvic region and  thigh, left  Spasm of back muscles  Decreased back mobility  Unsteadiness on feet  Difficulty navigating stairs  Hypophonia  Tremor      Subjective Assessment - 01/01/15 0851    Subjective Pt reports mild soreness in back, and some soreness in bilateral shoulders. She has been keeping up with HEP an walking outside more daily.    Pertinent History Pt reports back pain for more than 30 years, with most activity. Finally worsened enough to seek medical attention in January.  Has had injections, some of which were helpful. Reporting 5/10 pain in L PSIS. Resolves with sitting. Has sciatic symptoms into posterior sciatic nn, distribution. Walking causes repeated shooting sciatic pain. Occasional pain with lying down, but able to find a comfortable position. Pt also reporting a new diagnosis of PD within the last year.    Currently in Pain? No/denies                         [facility] Adult PT Treatment/Exercise - 01/01/15 0001    Lumbar Exercises: Stretches   Active Hamstring Stretch 3 reps;30 seconds   Active Hamstring Stretch Limitations supine with rope   Single Knee to Chest Stretch 3 reps;30 seconds   Lower Trunk Rotation 10 seconds   Lower Trunk Rotation Limitations 10 reps x 10"   Lumbar Exercises: Standing   Heel  Raises 15 reps  1x15 starting c toes on 2 inch box   Heel Raises Limitations Toe raises  1x15 starting c heels on 2 inch box   Functional Squats 10 reps  2x10 (cues for R TKE and forward pelvis)   Lumbar Exercises: Supine   Bridge 10 reps   Bridge Limitations 2x10 + clam c red TB    Other Supine Lumbar Exercises Cervical Retraction into 1 pillow  15x3sec   Other Supine Lumbar Exercises Supine bilat shoulder flexion c PVC   1x15   Lumbar Exercises: Sidelying   Hip Abduction 20 reps  Supine today for form: 2x10 c red TB    Knee/Hip Exercises: Seated   Other Seated Knee/Hip Exercises Sitting on dynadisc   ball tosses 15x fwd, 15x R45, 15x L45                 PT Education - 01/01/15 0909    Education provided Yes   Education Details Pt educated on how SLP can help with hypophonation with LSVT-loud centric prrogram.    Person(s) Educated Patient   Methods Explanation   Comprehension Verbalized understanding          PT Short Term Goals - 12/11/14 1622    PT SHORT TERM GOAL #1   Title By week two patient will demonstrate indep in an entry level HEP performing at least 4-6 days weeklty.    Status New   PT SHORT TERM GOAL #2   Title By week three patient will ambulate 447feet in 2 minutes to demonstrate safe limited community distances.    Status New   PT SHORT TERM GOAL #3   Title By week 3, patient will demonstrate the ability to perform standing at home for 1 hour or greater free of pain or symptoms to improve ability to performing housework and  kitchen activities.   Status New           PT Long Term Goals - 12/11/14 1627    PT LONG TERM GOAL #1   Title By the end of week 5, pt will demonstrate independence in an advanced HEP to continue progress after DC from therapy.    Status New   PT LONG TERM GOAL #2   Title By the end of 5 weeks, patient will tolerate a six miute walk test, covering 1488ft with less than 3/10 pain to demonstrate ability to safely perform  IADL.    Status New   PT LONG TERM GOAL #3   Title By week five pt will improve functional stregth as evidenced by 5x sit to stand in les than 8 seconds.    Status New               Plan - 01/01/15 0913    Clinical Impression Statement Pt tolerating session well today, able to complete all new advances in exercises and new activities without exacerbation of pain or significant fatigue. Tightness in R upper adductors seems to have been resolved. Pt making good progress toward goals of mobility, pain management, and HEP compiance. Still quit elimited  in gross motor controll of trunk and pelvis, but improving with each session. Beginning to  incorporate some new activities to address postural rigidity and flexion associated with PD.    Pt will benefit from skilled therapeutic intervention in order to improve on the following deficits Abnormal gait;Decreased activity tolerance;Decreased range of motion;Decreased mobility;Difficulty walking;Impaired flexibility;Improper body mechanics;Postural dysfunction;Pain   Rehab Potential Good   Clinical Impairments  Affecting Rehab Potential chronicity of current condition.    PT Frequency 2x / week   PT Treatment/Interventions Moist Heat;Therapeutic exercise;Therapeutic activities;Gait training;Functional mobility training;Patient/family education;Manual techniques   PT Next Visit Plan Continue with lumbar mobility stretches and strengthening exercises, gait training to improve trunk rotation and appropriate arm swing, Manual PRN.  Next session add standing on Airex cone rotation and continue sitting rotation exercises.     PT Home Exercise Plan No changes.   Consulted and Agree with Plan of Care Patient        Problem List Patient Active Problem List   Diagnosis Date Noted  . Normal coronary arteries 2001 05/08/2014  . Atrial fibrillation with RVR ([place]) 05/07/2014  . Pacemaker 08/10/2012  . Aortic insufficiency 08/10/2012  . Constipation 12/27/2011  . Family hx of colon cancer 12/27/2011  . Elevated troponin, trace positive secondary to rapid AF 07/06/2011  . Hypothyroidism 07/05/2011  . Paroxysmal atrial fibrillation ([place]) 07/05/2011  . Sinus pause, 7 seconds post conversion, S/P MDT pacemaker 07/05/11 07/05/2011  . Hypercholesterolemia 07/05/2011  . Angina at rest, with rapid AF 07/05/2011    Mckayla Mulcahey C 01/01/2015, 9:30 AM  9:30 AM  Etta Grandchild, PT, DPT [place] License # [id number]       [facility] [facility] [place], [place], [address] Phone: [phone]   Fax:  [phone]  Name: KODIE PETTREY MRN: [id number] Date of  Birth: 03/28/1931

## 2015-01-02 ENCOUNTER — Telehealth: Payer: Self-pay | Admitting: Cardiology

## 2015-01-02 ENCOUNTER — Ambulatory Visit (INDEPENDENT_AMBULATORY_CARE_PROVIDER_SITE_OTHER): Payer: Medicare Other | Admitting: *Deleted

## 2015-01-02 DIAGNOSIS — I495 Sick sinus syndrome: Secondary | ICD-10-CM

## 2015-01-02 NOTE — Telephone Encounter (Signed)
LMOVM reminding pt to send remote transmission.   

## 2015-01-06 ENCOUNTER — Ambulatory Visit (HOSPITAL_COMMUNITY): Payer: Medicare Other

## 2015-01-06 ENCOUNTER — Emergency Department (HOSPITAL_COMMUNITY): Payer: Medicare Other

## 2015-01-06 ENCOUNTER — Other Ambulatory Visit: Payer: Self-pay

## 2015-01-06 ENCOUNTER — Emergency Department (HOSPITAL_COMMUNITY)
Admission: EM | Admit: 2015-01-06 | Discharge: 2015-01-06 | Disposition: A | Discharge status: Home / Self Care | Payer: Medicare Other | Attending: Emergency Medicine | Admitting: Emergency Medicine

## 2015-01-06 ENCOUNTER — Encounter (HOSPITAL_COMMUNITY): Payer: Self-pay | Admitting: Emergency Medicine

## 2015-01-06 DIAGNOSIS — G20 Parkinson's disease: Secondary | ICD-10-CM | POA: Diagnosis not present

## 2015-01-06 DIAGNOSIS — G8929 Other chronic pain: Secondary | ICD-10-CM | POA: Diagnosis not present

## 2015-01-06 DIAGNOSIS — E039 Hypothyroidism, unspecified: Secondary | ICD-10-CM | POA: Diagnosis not present

## 2015-01-06 DIAGNOSIS — Z950 Presence of cardiac pacemaker: Secondary | ICD-10-CM | POA: Insufficient documentation

## 2015-01-06 DIAGNOSIS — M1990 Unspecified osteoarthritis, unspecified site: Secondary | ICD-10-CM | POA: Insufficient documentation

## 2015-01-06 DIAGNOSIS — Z79899 Other long term (current) drug therapy: Secondary | ICD-10-CM | POA: Insufficient documentation

## 2015-01-06 DIAGNOSIS — Z7901 Long term (current) use of anticoagulants: Secondary | ICD-10-CM | POA: Insufficient documentation

## 2015-01-06 DIAGNOSIS — R2681 Unsteadiness on feet: Secondary | ICD-10-CM

## 2015-01-06 DIAGNOSIS — E785 Hyperlipidemia, unspecified: Secondary | ICD-10-CM | POA: Diagnosis not present

## 2015-01-06 DIAGNOSIS — Z8719 Personal history of other diseases of the digestive system: Secondary | ICD-10-CM | POA: Insufficient documentation

## 2015-01-06 DIAGNOSIS — R079 Chest pain, unspecified: Secondary | ICD-10-CM | POA: Diagnosis not present

## 2015-01-06 DIAGNOSIS — R2689 Other abnormalities of gait and mobility: Secondary | ICD-10-CM

## 2015-01-06 DIAGNOSIS — R002 Palpitations: Secondary | ICD-10-CM | POA: Diagnosis present

## 2015-01-06 DIAGNOSIS — Z9889 Other specified postprocedural states: Secondary | ICD-10-CM | POA: Insufficient documentation

## 2015-01-06 DIAGNOSIS — I4891 Unspecified atrial fibrillation: Secondary | ICD-10-CM | POA: Insufficient documentation

## 2015-01-06 DIAGNOSIS — M25552 Pain in left hip: Secondary | ICD-10-CM

## 2015-01-06 DIAGNOSIS — M5442 Lumbago with sciatica, left side: Secondary | ICD-10-CM

## 2015-01-06 DIAGNOSIS — R6889 Other general symptoms and signs: Secondary | ICD-10-CM

## 2015-01-06 DIAGNOSIS — M62830 Muscle spasm of back: Secondary | ICD-10-CM

## 2015-01-06 DIAGNOSIS — Z789 Other specified health status: Secondary | ICD-10-CM

## 2015-01-06 HISTORY — DX: Tremor, unspecified: R25.1

## 2015-01-06 HISTORY — DX: Dorsalgia, unspecified: M54.9

## 2015-01-06 HISTORY — DX: Other chronic pain: G89.29

## 2015-01-06 HISTORY — DX: Reserved for inherently not codable concepts without codable children: IMO0001

## 2015-01-06 HISTORY — DX: Parkinson's disease: G20

## 2015-01-06 HISTORY — DX: Unsteadiness on feet: R26.81

## 2015-01-06 HISTORY — DX: Parkinson's disease without dyskinesia, without mention of fluctuations: G20.A1

## 2015-01-06 LAB — URINALYSIS, ROUTINE W REFLEX MICROSCOPIC
BILIRUBIN URINE: NEGATIVE
GLUCOSE, UA: NEGATIVE mg/dL
Ketones, ur: NEGATIVE mg/dL
Leukocytes, UA: NEGATIVE
Nitrite: NEGATIVE
SPECIFIC GRAVITY, URINE: 1.02 (ref 1.005–1.030)
pH: 6 (ref 5.0–8.0)

## 2015-01-06 LAB — CBC WITH DIFFERENTIAL/PLATELET
Basophils Absolute: 0 10*3/uL (ref 0.0–0.1)
Basophils Relative: 0 %
Eosinophils Absolute: 0.2 10*3/uL (ref 0.0–0.7)
Eosinophils Relative: 2 %
HEMATOCRIT: 44.1 % (ref 36.0–46.0)
Hemoglobin: 14.7 g/dL (ref 12.0–15.0)
Lymphocytes Relative: 23 %
Lymphs Abs: 2.1 10*3/uL (ref 0.7–4.0)
MCH: 29 pg (ref 26.0–34.0)
MCHC: 33.3 g/dL (ref 30.0–36.0)
MCV: 87 fL (ref 78.0–100.0)
MONO ABS: 1 10*3/uL (ref 0.1–1.0)
Monocytes Relative: 11 %
Neutro Abs: 6 10*3/uL (ref 1.7–7.7)
Neutrophils Relative %: 64 %
PLATELETS: 246 10*3/uL (ref 150–400)
RBC: 5.07 MIL/uL (ref 3.87–5.11)
RDW: 13.2 % (ref 11.5–15.5)
WBC: 9.4 10*3/uL (ref 4.0–10.5)

## 2015-01-06 LAB — URINE MICROSCOPIC-ADD ON

## 2015-01-06 LAB — TSH: TSH: 3.215 u[IU]/mL (ref 0.350–4.500)

## 2015-01-06 LAB — BASIC METABOLIC PANEL
Anion gap: 6 (ref 5–15)
BUN: 15 mg/dL (ref 6–20)
CO2: 29 mmol/L (ref 22–32)
Calcium: 9.2 mg/dL (ref 8.9–10.3)
Chloride: 108 mmol/L (ref 101–111)
Creatinine, Ser: 0.94 mg/dL (ref 0.44–1.00)
GFR, EST NON AFRICAN AMERICAN: 55 mL/min — AB (ref >60)
Glucose, Bld: 110 mg/dL — ABNORMAL HIGH (ref 65–99)
Potassium: 3.8 mmol/L (ref 3.5–5.1)
Sodium: 143 mmol/L (ref 135–145)

## 2015-01-06 LAB — MAGNESIUM: Magnesium: 1.8 mg/dL (ref 1.7–2.4)

## 2015-01-06 LAB — TROPONIN I: Troponin I: <0.03 ng/mL (ref <0.031)

## 2015-01-06 MED ORDER — DILTIAZEM HCL 30 MG PO TABS
60.0000 mg | ORAL_TABLET | Freq: Once | ORAL | Status: AC
  Administered 2015-01-06: 60 mg via ORAL
  Filled 2015-01-06: qty 2

## 2015-01-06 MED ORDER — SODIUM CHLORIDE 0.9 % IV SOLN
INTRAVENOUS | Status: DC
  Administered 2015-01-06: 10:00:00 via INTRAVENOUS

## 2015-01-06 MED ORDER — SODIUM CHLORIDE 0.9 % IV BOLUS (SEPSIS)
250.0000 mL | Freq: Once | INTRAVENOUS | Status: AC
  Administered 2015-01-06: 250 mL via INTRAVENOUS

## 2015-01-06 MED ORDER — SODIUM CHLORIDE 0.9 % IV BOLUS (SEPSIS)
500.0000 mL | Freq: Once | INTRAVENOUS | Status: AC
  Administered 2015-01-06: 500 mL via INTRAVENOUS

## 2015-01-06 MED ORDER — DILTIAZEM HCL ER COATED BEADS 180 MG PO CP24: 180.0000 mg | ORAL_CAPSULE | Freq: Every day | ORAL | Status: DC

## 2015-01-06 MED ORDER — SODIUM CHLORIDE 0.9 % IV SOLN
Freq: Once | INTRAVENOUS | Status: AC
  Administered 2015-01-06: 12:00:00 via INTRAVENOUS

## 2015-01-06 MED ORDER — DEXTROSE 5 % IV SOLN
5.0000 mg/h | INTRAVENOUS | Status: DC
  Administered 2015-01-06: 5 mg/h via INTRAVENOUS
  Filled 2015-01-06: qty 100

## 2015-01-06 MED ORDER — DILTIAZEM LOAD VIA INFUSION
10.0000 mg | Freq: Once | INTRAVENOUS | Status: AC
  Administered 2015-01-06: 10 mg via INTRAVENOUS
  Filled 2015-01-06: qty 10

## 2015-01-06 NOTE — Progress Notes (Deleted)
Patient seen and discussed with NP Purcell Nails, I agree with her documentation. Patient presented with symptomatic afib with RVR, rates and symptoms improved with dilt IV bolus followed by low dose dilt gtt. She has history of tachy-brady syndrome with pacemaker followed by DR Croitoru. We will give her 60mg  of short acting dilt and discontinue dilt gtt. Can monitor in ER for 1.5 hours or so and follow rates and symptoms, if rates remain less than 110 and asymptomatic with stable bp's ok for discharge from ER with increase of her long acting dilt at home to 180mg  If does not meet these criteria would need admission for rate control. She is orthostatic as well, agree with IVFs. She drinks mainly MTN dew, counsled on appropriate oral hydration at home. She has normal LVEF, continue IVFs   Zandra Abts MD

## 2015-01-06 NOTE — Consult Note (Addendum)
CARDIOLOGY CONSULT NOTE   Patient ID: Abigail Taylor MRN: [id number] DOB/AGE: 09/16/1931 81 y.o.  Admit Date: 01/06/2015 Referring Physician: ER-Abigail Taylor Primary Physician: Abigail Taylor., Taylor Consulting Cardiologist: Abigail Dolly Taylor Primary Cardiologist: Abigail Bosch Taylor Reason for Consultation: Afib with RVR  Clinical Summary Abigail Taylor is a 81 y.o.female followed by Dr. 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 She states that she has not had her PPM checked due to malfunction of the remote module and is due to see pacemaker clinic on Friday and bring module.   She states she noticed her HR begin to race last evening and continued all night. She is going to PT due to back issues and asked the PT staff to check her pulse when she got there. They found it to be elevated and advised she come to ER.   IN ER, BP 115/93, HR 139. Afebrile with 100% O2 sat. She was given IV diltiazem bolus and started on gtt at 5 mg hour. She is hypotensive now. She states she has been compliant with her home medications with the exception of Sinemet, as this makes her sick. She does not eat well and states she has chronic constipation for which she uses daily Fleets enema. Labs are essentially normal. CXR negative for CHF or pneumonia. No evidence of anemia. She admits to drinking [place]. Abigail Taylor daily. Sipping on them all day.   Heart rate is better controlled on diltiazem gtt. She is normally on diltiazem 120 mg daily and metoprolol 25 mg BID at home. She states that she occasionally has elevated HRs but they usually normalize after a few minutes. Review of Dr. Johnnette Taylor noted confirms this. She has had episodes in the past lasting up to one and half hours.   No Known Allergies  Medications Scheduled Medications:    Infusions: . sodium chloride 75 mL/hr  at 01/06/15 0945  . diltiazem (CARDIZEM) infusion 5 mg/hr (01/06/15 0945)    PRN Medications:     Past Medical History  Diagnosis Date  . Hypothyroidism   . Hyperlipidemia   . Angina at rest, with the tachycardia 07/05/2011  . Aortic insufficiency 07/05/2011  . Constipation 12/27/2011  . Atrial fibrillation ([place])   . Brady-tachy syndrome ([place])     Dual chamber Medtronic pacemaker Adapta  . Pacemaker   . OA (osteoarthritis)   . Chronic back pain   . Gait instability   . Tremor   . Parkinson's disease ([place])   . Normal cardiac stress test 2013    Past Surgical History  Procedure Laterality Date  . Permanent pacemaker insertion  07/05/2011    Medtronic Adapta dual chamber  . Cardiac catheterization  05/26/1999    normal  . Nm myoview ltd  07/19/2011    normal  . Colonoscopy N/A 11/15/2012    Procedure: COLONOSCOPY;  Surgeon: Abigail Houston, Taylor;  [facility];  Service: Endoscopy;  Laterality: N/A;  1030  . Permanent pacemaker insertion N/A 07/05/2011    Procedure: PERMANENT PACEMAKER INSERTION;  Surgeon: Abigail Taylor;  Location: [facility]ATH [facility];  Service: Cardiovascular;  Laterality: N/A;  . Insert / replace / remove pacemaker    . Abdominal hysterectomy    . Cardiac catheterization  2001    normal coronary arteries    Family History  Problem Relation Age of Onset  . Colon cancer Mother 69  .  Heart attack Mother   . Cancer Father     Social History Abigail Taylor reports that she has never smoked. She has never used smokeless tobacco. Abigail Taylor reports that she does not drink alcohol.  Review of Systems Complete review of systems are found to be negative unless outlined in H&P above.  Physical Examination Blood pressure 98/60, pulse 91, temperature 97.6 F (36.4 C), temperature source Oral, resp. rate 16, height 5\' 6"  (1.676 m), weight 120 lb (54.432 kg), SpO2 100 %. No intake or output data in the 24 hours ending 01/06/15 1054  Telemetry:Atrial fib in  the 80's.   GEN: No acute distress HEENT: Conjunctiva and lids normal, oropharynx clear with moist mucosa. Neck: Supple, no elevated JVP or carotid bruits, no thyromegaly. Lungs: Clear to auscultation, nonlabored breathing at rest. Cardiac:Irregular rate and rhythm, no S3 or significant systolic murmur, no pericardial rub. Abdomen: Soft, nontender, no hepatomegaly, bowel sounds present, no guarding or rebound. Extremities: No pitting edema, distal pulses 2+. Skin: Warm and dry. Musculoskeletal: No kyphosis. Neuropsychiatric: Alert and oriented x3, affect grossly appropriate.  Prior Cardiac Testing/Procedures 1.Echocardiogram Left ventricle: The cavity size was normal. Wall thickness was normal. Systolic function was normal. The estimated ejection fraction was in the range of 60% to 65%. - Aortic valve: There was mild regurgitation. - Mitral valve: There was mild regurgitation. - Right atrium: The atrium was mildly dilated.  Nuclear Stress Test 06/2011 Negative for ischemia  Lab Results  Basic Metabolic Panel:  Recent Labs Lab 01/06/15 0919  NA 143  K 3.8  CL 108  CO2 29  GLUCOSE 110*  BUN 15  CREATININE 0.94  CALCIUM 9.2    Liver Function Tests: No results for input(s): AST, ALT, ALKPHOS, BILITOT, PROT, ALBUMIN in the last 168 hours.  CBC:  Recent Labs Lab 01/06/15 0919  WBC 9.4  NEUTROABS 6.0  HGB 14.7  HCT 44.1  MCV 87.0  PLT 246    Cardiac Enzymes:  Recent Labs Lab 01/06/15 0919  TROPONINI <0.03    Radiology: Dg Chest Portable 1 View  01/06/2015  CLINICAL DATA:  Chest pain, irregular heart beats EXAM: PORTABLE CHEST 1 VIEW COMPARISON:  05/07/2014 FINDINGS: Cardiomediastinal silhouette is stable. Dual lead cardiac pacemaker is unchanged in position. No acute infiltrate or pleural effusion. No pulmonary edema. Mild hyperinflation again noted. IMPRESSION: No active disease.  Mild hyperinflation again noted. Electronically Signed   By: Lahoma Crocker  M.D.   On: 01/06/2015 09:45     ECG: Atrial fib with RVR with RBBB.    Impression and Recommendations  1.Atrial fib with RVR:  Heart rate is better now that she is on diltiazem gtt. She has been medically complaint with diltiazem. She has had a hx PAF with RVR in the past. She has been drinking [place] and not eating well. Having some back pain. I will check a U/A. Give her a fluid bolus, and begin IV NS at 50 cc/hour.   She is not listed to be on BB on last office visit with Dr. Recardo Taylor, but this is listed in her medication list in ER. I asked her if she was taking this and she had been taken off of metoprolol and placed on diltiazem by cardiologist and does not take metoprolol any longer. Consider adding low dose BB for better HR control if necessary. Will wait for response to IV fluids and UA results.  2. Sinus node dysfunction with tacybrady syndrome: Medtronic PPM in situ. She has not  had it checked remotely since 06/02/2014 due to malfunctioning home module. She is due to be seen on Friday.   3. Hypothyroidism: TSH is pending.    Signed: Phill Myron. Lawrence NP [place]  01/06/2015, 10:54 AM Co-Sign Taylor  Patient seen and discussed with NP Purcell Nails, I agree with her documentation. Patient presented with symptomatic afib with RVR, rates and symptoms improved with dilt IV bolus followed by low dose dilt gtt. She has history of tachy-brady syndrome with pacemaker followed by DR Croitoru. We will give her 60mg  of short acting dilt and discontinue dilt gtt. Can monitor in ER for 1.5 hours or so and follow rates and symptoms, if rates remain less than 110 and asymptomatic with stable bp's ok for discharge from ER with increase of her long acting dilt at home to 180mg  If does not meet these criteria would need admission for rate control. She is orthostatic as well, agree with IVFs. She drinks mainly MTN dew, counsled on appropriate oral hydration at home. She has normal LVEF, continue IVFs. If  discharged she should f/u with NP Purcell Nails if our cardiology clinic in 1 week.    Zandra Abts Taylor

## 2015-01-06 NOTE — ED Notes (Signed)
MD at bedside. 

## 2015-01-06 NOTE — ED Notes (Signed)
Spoke with Dr Harl Bowie regarding BP's. HR's in  70's. Denies SOB, dizziness. Or CP. States he  is fine with her being discharged. Follow up appointment will be scheduled

## 2015-01-06 NOTE — ED Notes (Signed)
Arnold Long, NP to nurses station, verbal order to given 500 ml bolus and 75 ml/hr.

## 2015-01-06 NOTE — Therapy (Signed)
[place], [place], [address] Phone: [phone]   Fax:  [phone]  Patient Details  Name: Abigail Taylor MRN: [id number] Date of Birth: 1931-06-19 Referring Provider:  Laroy Apple, MD  Encounter Date: 01/06/2015  S:Pt entered dept with hand on chest, stated she doesn't feel good today like her heart is irregular and reported she felt like she was chocking on way to dept O: A: Pt entered dept with hand on chest, stated she doesn't feel good today like her heart is irregular and reported she felt like she was chocking on way to dept.  BP 102/86 mmHg and 130 bmp.  Pt and her husband advised to go to ER if continues to feel irregular heart beats and chest pain.   P: F/U with symptoms next session.  Continue with lumbar mobility stretches and strengthening exercises, gait training to improve trunk rotation and appropriate arm swing, Manual PRN.  Next session add standing on Airex cone rotation and continue sitting rotation exercises.    [address] LPTA; [place]  Aldona Lento 01/06/2015, 9:11 AM  [place], [place], [address] Phone: [phone]   Fax:  [phone]

## 2015-01-06 NOTE — Discharge Instructions (Signed)
°Emergency Department Resource Guide °1) Find a Doctor and Pay Out of Pocket °Although you won't have to find out who is covered by your insurance plan, it is a good idea to ask around and get recommendations. You will then need to call the office and see if the doctor you have chosen will accept you as a new patient and what types of options they offer for patients who are self-pay. Some doctors offer discounts or will set up payment plans for their patients who do not have insurance, but you will need to ask so you aren't surprised when you get to your appointment. ° °2) Contact Your Local Health Department °Not all health departments have doctors that can see patients for sick visits, but many do, so it is worth a call to see if yours does. If you don't know where your local health department is, you can check in your phone book. The CDC also has a tool to help you locate your state's health department, and many state websites also have listings of all of their local health departments. ° °3) Find a Walk-in Clinic °If your illness is not likely to be very severe or complicated, you may want to try a walk in clinic. These are popping up all over the country in pharmacies, drugstores, and shopping centers. They're usually staffed by nurse practitioners or physician assistants that have been trained to treat common illnesses and complaints. They're usually fairly quick and inexpensive. However, if you have serious medical issues or chronic medical problems, these are probably not your best option. ° °No Primary Care Doctor: °- Call Health Connect at  832-8000 - they can help you locate a primary care doctor that  accepts your insurance, provides certain services, etc. °- Physician Referral Service- 1-800-533-3463 ° °Chronic Pain Problems: °Organization         Address  Phone   Notes  °[facility]  (336) 297-2271 Patients need to be referred by their primary care doctor.  ° °Medication  Assistance: °Organization         Address  Phone   Notes  °[place] [facility] [address] °[place], [place] [address] (336) 641-8030 --Must be a resident of [place] °-- Must have NO insurance coverage whatsoever (no Medicaid/ Medicare, etc.) °-- The pt. MUST have a primary care doctor that directs their care regularly and follows them in the community °  °[facility]  (866) 331-1348   °[employer]  (888) 892-1162   ° °Agencies that provide inexpensive medical care: °Organization         Address  Phone   Notes  °[facility]  (336) 832-8035   °[facility]    (336) 832-7272   °[facility] [address] °[place], [place] [address] (336) 832-4777   °[facility] [address], °[place] (336) 271-4999   °[facility]    (336) 373-0678   °[facility]    (336) 272-1050   °[facility] ° [address], [place] Phone:  (336) 832-4444, Fax:  (336) 832-4440 Hours of Operation:  9 am - 6 pm, M-F.  Also accepts Medicaid/Medicare and self-pay.  °[facility] ° [address], [place] Phone: (336) 832-3150, Fax: (336) [phone]. Hours of Operation:  8:30 am - 5:30 pm, M-F.  Also accepts Medicaid and self-pay.  °[facility] [place] [address], [place] Phone: (336) 878-6027   °[facility] [address], [place], [place] (336)723-1848, Ext. 123 Mondays & Thursdays: 7-9 AM.  First 15 patients are seen on a first come, first serve basis. °  ° °Medicaid-accepting [place] Providers: ° °Organization         Address  Phone   Notes  °[facility] [address], [place] (336) 641-2100 Also accepts self-pay patients.  °[facility] [address], [place] ° (336) 856-9996   °[facility] [address], [place]  (336) 288-8857   °[facility] [address] [place] [address], [place] (336) 299-7000   °[facility] [address], [place]  ° (336) 373-1557 Only accepts [place] Medicaid patients after they have their name applied to their card.  ° °Self-Pay (no insurance) in [place]: ° °Organization         Address  Phone   Notes  °Sickle Cell Patients, [facility] [address], [place] (336) 832-1970   °[facility] [address], [place] (336) 832-4400   °[facility] [place] ° [address],  (336) 992-4800   °[facility]/Dr. Osei-Bonsu ° [address] [place] [address], [place] or [address], [place] (336) 841-8500 Phone number for both [place] and [place] locations is the same.  °[facility] [address] [place] [address], [place] (336) 299-0000   °[facility] [address] [place] [address], [place] or [address] (336) 852-7530 °(336) 878-2260   °[facility] [address], [place] (336) 350-1642, phone; (336) 294-5005, fax Sees patients 1st and 3rd Saturday of every month.  Must not qualify for public or private insurance (i.e. Medicaid, Medicare, [place] Health Choice, Veterans' Benefits) • Household income should be no more than 200% of the poverty level •The clinic cannot treat you if you are pregnant or think you are pregnant • Sexually transmitted diseases are not treated at the clinic.  ° ° °Dental Care: °Organization         Address  Phone  Notes  °[place] [facility] [address], [place] (336) 641-6152 Accepts children up to age 21 who are enrolled in Medicaid or [place] Health Choice; pregnant women with a Medicaid card; and children who have applied for Medicaid or [place] Health Choice, but were declined, whose parents can pay a reduced fee at time of service.  °[place]  [facility] [place]  [address], [place] (336) 641-7733 Accepts children up to age 21 who are enrolled in Medicaid or [place] Health Choice; pregnant women with a Medicaid card; and children who have applied for Medicaid or [place] Health Choice, but were declined, whose parents can pay a reduced fee at time of service.  °[facility] ° [address],  (336) 641-4533 Patients are seen by appointment only. Walk-ins are not accepted. [facility] will see patients 18 years of age and older. °Monday - Tuesday (8am-5pm) °Most Wednesdays (8:30-5pm) °$30 per visit, cash only  °[facility] ° [address], [place] (336) 641-4533 Patients are seen by appointment only. Walk-ins are not accepted. [facility] will see patients 18 years of age and older. °One   Wednesday Evening (Monthly: Volunteer Based).  $30 per visit, cash only  °[facility]  (919) 537-3737 for adults; Children under age 4, call Graduate Pediatric Dentistry at (919) [phone]. Children aged 4-14, please call (919) [phone] to request a pediatric application. ° Dental services are provided in all areas of dental care including fillings, crowns and bridges, complete and partial dentures, implants, gum treatment, root canals, and extractions. Preventive care is also provided. Treatment is provided to both adults and children. °Patients are selected via a lottery and there is often a waiting list. °  °[facility] [address], °[place] ° (336) 763-8833 www.drcivils.[email or web address] °  °[facility] [address], [place], [place] (336)723-1848, Ext. 123 Second and Fourth Thursday of each month, opens at 6:30 AM; Clinic ends at 9 AM.  Patients are seen on a first-come first-served basis, and a limited number are seen during each clinic.  ° °[facility] ° [address], [place], [place] (336) 723-7904    Eligibility Requirements °You must have lived in [place], or [place] counties for at least the last three months. °  You cannot be eligible for state or federal sponsored healthcare insurance, including Veterans Administration, Medicaid, or Medicare. °  You generally cannot be eligible for healthcare insurance through your employer.  °  How to apply: °Eligibility screenings are held every Tuesday and Wednesday afternoon from 1:00 pm until 4:00 pm. You do not need an appointment for the interview!  °[facility] [address], [place], [place] 336-631-2330   °[facility]  336-342-8273   °[place]  336-703-3100   °[facility]  336-570-6415   ° °Behavioral Health Resources in the Community: °Intensive Outpatient Programs °Organization         Address  Phone  Notes  °[facility] [address], [place], [place] 336-878-6098   °[facility] [address], [place], [place] 336-832-9800   °[facility] [address], [place], [place] ° 336-882-2125   °[facility] [address],  °[place], [place] 1-800-853-5163 or 336-641-4981   °Substance Abuse Resources °Organization         Address  Phone  Notes  °[facility]  336-882-2125   °[facility]  336-784-9470   °[facility]  336-285-9073   °[facility]  336-845-3988   °Residential & Outpatient Substance Abuse Program  1-800-659-3381   °Psychological Services °Organization         Address  Phone  Notes  °[facility]  336- 832-9600   °[facility]  336- 378-7881   °[facility] [address], [place] 1-800-853-5163 or 336-641-4981   ° °Mobile Crisis Teams °Organization         Address  Phone  Notes  °[facility], Mobile Crisis Care Unit  1-877-626-1772   °Assertive °[facility] ° [address]  [place], [place] 336-834-9664   °Sharon DeEsch [address] °[place] 336-554-5454   ° °Self-Help/Support Groups °Organization         Address  Phone             Notes  °[facility] [place] - variety of support groups  336- 373-1402 Call for more information  °Narcotics Anonymous (NA), [facility] [address], °[place]  2 meetings at this location  ° °  Residential Treatment Programs Organization         Address  Phone  Notes  [facility] [address]    [place]  1-919-663-9076   [facility]  [address], [place] [id number], [place], [place]   [place], [place] 346-713-4073 Admissions: 8am-3pm M-F  [facility] [place] [address]    [place], [place] [id number]   [facility] [address] [place], [place], [place]   [facility] [address]  [place], [place]   [facility] - Intensive Outpatient [address], [place], [place]   [facility] [facility] [address] [address]  [place], [place] 1-940-315-3085 or 928-536-0385   [facility] [address], [place], [place] Accepts Medicaid  [facility] [address],  [place] 1-701-232-0845 Substance Abuse/Addiction Treatment   [facility]         Address  Phone  Notes  [facility]  480-038-9471   Domenic Schwab, PhD [address] [place], [place]   (705)233-3616 or 984-133-5099   [facility] [place], [place] 410-044-4920   [facility] [address] [place], [place] 252-493-0306 Insurance/Medicaid/sponsorship through [facility] and Families [address] [place]                                    [place], [place] 872-284-8125 [place] [address], [place] (343)[phone]    Dr. Adele Schilder  430-605-2512   [facility] Dept. 1) [address], [place] 2) [place] 3)  [place] [address], [place] 281-535-6364 336-598-1672  702-614-7278   [place] 581-570-2230 or (918)[phone] (After Hours)      Stop your cardizem and take the new cardizem prescription as directed. Try to cut down your intake of [facility]. Call your regular Cardiologist today to schedule a follow up appointment within the next 2 days.  Return to the Emergency Department immediately sooner if worsening.

## 2015-01-06 NOTE — Progress Notes (Signed)
Rates remain stable off dilt gtt. Borderline low bp in low 100s/60s, patient is asymptomatic. She did receive the additional 60mg  of short acting dilt in ER. [place] for discharge from our standpoint, please increase her home dilt to 180mg  daily. We will arrange f/u with our clinic next week and then she may continue her regular follow up with Dr Sallyanne Kuster.   Zandra Abts MD

## 2015-01-06 NOTE — ED Notes (Signed)
Pt reports chest pain since last night.  Pt says feels like a "choking" sensation and reports dizziness and sob.

## 2015-01-06 NOTE — ED Provider Notes (Signed)
CSN: [id number]     Arrival date & time 01/06/15  0904 History   First MD Initiated Contact with Patient 01/06/15 0914     Chief Complaint  Patient presents with  . Chest Pain  . Palpitations      HPI Pt was seen at 0915. Per pt, c/o gradual onset and worsening of persistent palpitations since last night. Has been associated with CP, SOB, and lightheadedness. Describes the CP as "tightness" and "choking sensation." Endorses hx of afib; denies missing any doses of her meds. Denies cough, no abd pain, no N/V/D, no back pain, no syncope.     Past Medical History  Diagnosis Date  . Hypothyroidism   . Hyperlipidemia   . Angina at rest, with the tachycardia 07/05/2011  . Aortic insufficiency 07/05/2011  . Constipation 12/27/2011  . Atrial fibrillation ([place])   . Brady-tachy syndrome ([place])     Dual chamber Medtronic pacemaker Adapta  . Pacemaker   . OA (osteoarthritis)   . Chronic back pain   . Gait instability   . Tremor   . Parkinson's disease [facility])    Past Surgical History  Procedure Laterality Date  . Permanent pacemaker insertion  07/05/2011    Medtronic Adapta dual chamber  . Cardiac catheterization  05/26/1999    normal  . Nm myoview ltd  07/19/2011    normal  . Colonoscopy N/A 11/15/2012    Procedure: COLONOSCOPY;  Surgeon: [name], MD;  [facility];  Service: Endoscopy;  Laterality: N/A;  1030  . Permanent pacemaker insertion N/A 07/05/2011    Procedure: PERMANENT PACEMAKER INSERTION;  Surgeon: [name], MD;  Location: [facility] CATH LAB;  Service: Cardiovascular;  Laterality: N/A;  . Insert / replace / remove pacemaker    . Abdominal hysterectomy     Family History  Problem Relation Age of Onset  . Colon cancer Mother 71  . Heart attack Mother   . Cancer Father    Social History  Substance Use Topics  . Smoking status: Never Smoker   . Smokeless tobacco: Never Used  . Alcohol Use: No    Review of Systems ROS: Statement: All systems negative  except as marked or noted in the HPI; Constitutional: Negative for fever and chills. ; ; Eyes: Negative for eye pain, redness and discharge. ; ; ENMT: Negative for ear pain, hoarseness, nasal congestion, sinus pressure and sore throat. ; ; Cardiovascular: +CP, SOB, palpitations. Negative for diaphoresis, and peripheral edema. ; ; Respiratory: Negative for cough, wheezing and stridor. ; ; Gastrointestinal: Negative for nausea, vomiting, diarrhea, abdominal pain, blood in stool, hematemesis, jaundice and rectal bleeding. . ; ; Genitourinary: Negative for dysuria, flank pain and hematuria. ; ; Musculoskeletal: Negative for back pain and neck pain. Negative for swelling and trauma.; ; Skin: Negative for pruritus, rash, abrasions, blisters, bruising and skin lesion.; ; Neuro: +lightheadedness. Negative for headache and neck stiffness. Negative for weakness, altered level of consciousness , altered mental status, extremity weakness, paresthesias, involuntary movement, seizure and syncope.      Allergies  Review of patient's allergies indicates no known allergies.  Home Medications   Prior to Admission medications   Medication Sig Start Date End Date Taking? Authorizing Provider  carbidopa-levodopa (SINEMET IR) 25-100 MG per tablet Take 1 tablet by mouth daily.    Historical Provider, MD  diltiazem (CARDIZEM CD) 120 MG 24 hr capsule Take 1 capsule (120 mg total) by mouth daily. 06/02/14   Isaiah Serge, NP  HYDROcodone-acetaminophen (  NORCO/VICODIN) 5-325 MG per tablet Take 1 tablet by mouth every 6 (six) hours as needed for moderate pain.    Historical Provider, MD  levothyroxine (SYNTHROID, LEVOTHROID) 50 MCG tablet Take 50 mcg by mouth daily.    Historical Provider, MD  rivaroxaban (XARELTO) 20 MG TABS tablet Take 1 tablet (20 mg total) by mouth daily with supper. 08/29/14   Mihai Croitoru, MD  rosuvastatin (CRESTOR) 10 MG tablet Take 10 mg by mouth at bedtime.    Historical Provider, MD   BP 115/93 mmHg   Pulse 139  Temp(Src) 97.6 F (36.4 C) (Oral)  Resp 18  Ht 5\' 6"  (1.676 m)  Wt 120 lb (54.432 kg)  BMI 19.38 kg/m2  SpO2 100%   Filed Vitals:   01/06/15 0916 01/06/15 0930 01/06/15 1000  BP: 115/93 116/78 103/58  Pulse: 139 74 88  Temp: 97.6 F (36.4 C)    TempSrc: Oral    Resp: 18 16 14   Height: 5\' 6"  (1.676 m)    Weight: 120 lb (54.432 kg)    SpO2: 100% 100% 100%     09:17:10 Orthostatic Vital Signs LR  Orthostatic Lying  - BP- Lying: 112/76 mmHg ; Pulse- Lying: 141  Orthostatic Sitting - BP- Sitting: 123/73 mmHg ; Pulse- Sitting: 136  Orthostatic Standing at 0 minutes - BP- Standing at 0 minutes: 95/54 mmHg ; Pulse- Standing at 0 minutes: 138       Physical Exam 0920: Physical examination:  Nursing notes reviewed; Vital signs and O2 SAT reviewed;  Constitutional: Well developed, Well nourished, Well hydrated, Uncomfortable apprearing.; Head:  Normocephalic, atraumatic; Eyes: EOMI, PERRL, No scleral icterus; ENMT: Mouth and pharynx normal, Mucous membranes moist; Neck: Supple, Full range of motion, No lymphadenopathy; Cardiovascular: Tachycardic rate and irregular irregular rhythm, No gallop; Respiratory: Breath sounds clear & equal bilaterally, No wheezes.  Speaking full sentences with ease, Normal respiratory effort/excursion; Chest: Nontender, Movement normal; Abdomen: Soft, Nontender, Nondistended, Normal bowel sounds; Genitourinary: No CVA tenderness; Extremities: Pulses normal, No tenderness, No edema, No calf edema or asymmetry.; Neuro: AA&Ox3, Major CN grossly intact.  Speech clear. No gross focal motor or sensory deficits in extremities.; Skin: Color normal, Warm, Dry.   ED Course  Procedures (including critical care time)  Labs Review  Imaging Review  I have personally reviewed and evaluated these images and lab results as part of my medical decision-making.   EKG Interpretation   Date/Time:  Tuesday January 06 2015 09:12:19 EST Ventricular Rate:  145 PR  Interval:    QRS Duration: 141 QT Interval:  340 QTC Calculation: 528 R Axis:   90 Text Interpretation:  Atrial fibrillation with rapid ventricular response  Right bundle branch block Artifact When compared with ECG of 05/06/2013 No  significant change was found Confirmed by [facility]  MD, Nunzio Cory [phone]) on  01/06/2015 9:23:48 AM      MDM  MDM Reviewed: previous chart, nursing note and vitals Reviewed previous: labs and ECG Interpretation: labs, ECG and x-ray Total time providing critical care: 30-74 minutes. This excludes time spent performing separately reportable procedures and services. Consults: admitting MD     CRITICAL CARE Performed by: Alfonzo Feller Total critical care time: 35 minutes Critical care time was exclusive of separately billable procedures and treating other patients. Critical care was necessary to treat or prevent imminent or life-threatening deterioration. Critical care was time spent personally by me on the following activities: development of treatment plan with patient and/or surrogate as well as nursing, discussions with consultants,  evaluation of patient's response to treatment, examination of patient, obtaining history from patient or surrogate, ordering and performing treatments and interventions, ordering and review of laboratory studies, ordering and review of radiographic studies, pulse oximetry and re-evaluation of patient's condition.   Results for orders placed or performed during the hospital encounter of 01/06/15  CBC with Differential  Result Value Ref Range   WBC 9.4 4.0 - 10.5 K/uL   RBC 5.07 3.87 - 5.11 MIL/uL   Hemoglobin 14.7 12.0 - 15.0 g/dL   HCT 44.1 36.0 - 46.0 %   MCV 87.0 78.0 - 100.0 fL   MCH 29.0 26.0 - 34.0 pg   MCHC 33.3 30.0 - 36.0 g/dL   RDW 13.2 11.5 - 15.5 %   Platelets 246 150 - 400 K/uL   Neutrophils Relative % 64 %   Neutro Abs 6.0 1.7 - 7.7 K/uL   Lymphocytes Relative 23 %   Lymphs Abs 2.1 0.7 - 4.0 K/uL    Monocytes Relative 11 %   Monocytes Absolute 1.0 0.1 - 1.0 K/uL   Eosinophils Relative 2 %   Eosinophils Absolute 0.2 0.0 - 0.7 K/uL   Basophils Relative 0 %   Basophils Absolute 0.0 0.0 - 0.1 K/uL  Basic metabolic panel  Result Value Ref Range   Sodium 143 135 - 145 mmol/L   Potassium 3.8 3.5 - 5.1 mmol/L   Chloride 108 101 - 111 mmol/L   CO2 29 22 - 32 mmol/L   Glucose, Bld 110 (H) 65 - 99 mg/dL   BUN 15 6 - 20 mg/dL   Creatinine, Ser 0.94 0.44 - 1.00 mg/dL   Calcium 9.2 8.9 - 10.3 mg/dL   GFR calc non Af Amer 55 (L) >60 mL/min   GFR calc Af Amer >60 >60 mL/min   Anion gap 6 5 - 15  Troponin I  Result Value Ref Range   Troponin I <0.03 <0.031 ng/mL   Dg Chest Portable 1 View 01/06/2015  CLINICAL DATA:  Chest pain, irregular heart beats EXAM: PORTABLE CHEST 1 VIEW COMPARISON:  05/07/2014 FINDINGS: Cardiomediastinal silhouette is stable. Dual lead cardiac pacemaker is unchanged in position. No acute infiltrate or pleural effusion. No pulmonary edema. Mild hyperinflation again noted. IMPRESSION: No active disease.  Mild hyperinflation again noted. Electronically Signed   By: Lahoma Crocker M.D.   On: 01/06/2015 09:45    1015:  Monitor afib/RVR with rates to 150-160's on arrival.  IV cardizem bolus and gtt ordered. Pt's HR now 80-100's, afib on monitor. Pt states her palpitations "feel better now." +orthostatic on VS; judicious IVF given. Pt already taking xarelto.  T/C to Cards Dr. Harl Bowie, case discussed, including:  HPI, pertinent PM/SHx, VS/PE, dx testing, ED course and treatment:  Agreeable to come to ED for evaluation for possible d/c vs admit. Pt and family agreeable with plan.   1440:  Cards has evaluated pt: pt's IV cardizem has been d/c'd and she was given PO cardizem, HR now 70's, SBP 100's. Pt states she "feels fine" and wants to go home. ED RN has spoken with Cards MD: OK to d/c pt, increase cardizem to 180mg  q daily and f/u this week. Pt is agreeable with this plan.   Francine Graven, DO 01/09/15 [id number]

## 2015-01-08 ENCOUNTER — Telehealth (HOSPITAL_COMMUNITY): Payer: Self-pay

## 2015-01-08 ENCOUNTER — Telehealth: Payer: Self-pay | Admitting: Cardiovascular Disease

## 2015-01-08 ENCOUNTER — Ambulatory Visit (HOSPITAL_COMMUNITY): Payer: Medicare Other

## 2015-01-08 LAB — URINE CULTURE

## 2015-01-08 NOTE — Telephone Encounter (Signed)
Patient wanted clarification on diltiazem dose, and also reported BP reading was 101/60 after taking medications this morning. Pt not having any symptoms. She did state she had a low BP at outpatient rehab the other day and they had her eval'd in [facility] ED, ultimately she was discharged.  She has ED f/u w/ Dr. Purcell Nails on Monday.  Advised to take meds as indicated, call if further concerns in interim. Pt voiced understanding.

## 2015-01-08 NOTE — Telephone Encounter (Signed)
Please call,question about her medicine. °

## 2015-01-08 NOTE — Telephone Encounter (Signed)
01/08/15 called patient at 9:10a at home after not showing for 8:45a appointment. The EMR shows patient was in ED on 12/13 for uncontrolled HR in the presence of afib c RVR. Pt was discharged with new medication. On telephone this call, pt reported feeling same chest palpitations starting at around 8:00, sometime after she had already taken the new medication. She reports that she then took the old medication as well, in hopes to calm her heart, while she rests. Pt reports that she thinks she is starting to feel better.  I explained to patient that unless prescribing provider had indicated to do so, taking both medications is not safe, and could result in hypotension (a concern mentioned in cardiology ED note) or other dangerous side effect. Pt encouraged to return to ED at this time. She is hesitant and insists on waiting it out at home. After further discussion, patient is agreeable to calling cardiology office for further instruction.   9:24 AM 01/08/2015 Etta Grandchild, PT, DPT Central License # [id number]

## 2015-01-09 NOTE — Progress Notes (Signed)
Remote pacemaker transmission.   

## 2015-01-12 ENCOUNTER — Telehealth (HOSPITAL_COMMUNITY): Payer: Self-pay

## 2015-01-12 ENCOUNTER — Ambulatory Visit (INDEPENDENT_AMBULATORY_CARE_PROVIDER_SITE_OTHER): Payer: Medicare Other | Admitting: Adult Health

## 2015-01-12 ENCOUNTER — Encounter: Payer: Self-pay | Admitting: Adult Health

## 2015-01-12 VITALS — Ht 66.0 in | Wt 131.0 lb

## 2015-01-12 DIAGNOSIS — R42 Dizziness and giddiness: Secondary | ICD-10-CM | POA: Diagnosis not present

## 2015-01-12 MED ORDER — FLUDROCORTISONE ACETATE 0.1 MG PO TABS: 0.1000 mg | ORAL_TABLET | Freq: Every day | ORAL | Status: DC

## 2015-01-12 MED ORDER — DILTIAZEM HCL ER COATED BEADS 120 MG PO CP24: 120.0000 mg | ORAL_CAPSULE | Freq: Every day | ORAL | Status: DC

## 2015-01-12 NOTE — Patient Instructions (Addendum)
Your physician recommends that you schedule a follow-up appointment in: 1-2 weeks with Dr. Recardo Evangelist   Your physician has recommended you make the following change in your medication:  Decrease Diltiazem to 120 mg Daily  Start Florinef 0.1 mg Daily   If you need a refill on your cardiac medications before your next appointment, please call your pharmacy.  You have been given a Rx for Compression Stockings  Thank you for choosing [facility]!

## 2015-01-12 NOTE — Progress Notes (Signed)
Cardiology Office Note   Date:  01/12/2015   ID:  Abigail Taylor, DOB 05/28/1931, MRN [id number]  PCP:  Glo Herring., MD  Cardiologist: [place]/  Jory Sims, NP   No chief complaint on file.     History of Present Illness: Abigail Taylor is a 81 y.o. female who presents for ongoing assessment and management of for tachycardia-bradycardia syndrome with history of sinus arrest status post implantation of a dual-chamber permanent pacemaker and paroxysmal atrial fibrillation.CHADS VASC Score of 3. She has normal left ventricular systolic function and did not have coronary artery disease by remote angiography and a normal nuclear stress test in 2013.  She is here post ER visit where she was seen for Afib RVR and dehydration with hypotension. She was given a fluid bolus and also was temporarily placed on dilitazem gtt to regulate her HR. She was then given a extra dose of diltiazem 60 mg in ER and increased on dose of diltiazem to 180 mg daily. Unfortunately she took both the 120 mg and the 180 mg together for a few days and became extremely dizzy and lightheaded, near syncopal. She spoke with Dr. Johnnette Gourd office and was told to only take the 180 mg tablet.   She has continued to have dizziness, especially with position change from sitting to standing, feeling very weak. Here in the office she was found to be orthostatic with BP dropping into the [id number] systolic, with HR normal but with standing, HR increased to [id number] bpm with systolic BP 70. She was symptomatic with dizziness and could feel her HR increasing.   Past Medical History  Diagnosis Date  . Hypothyroidism   . Hyperlipidemia   . Angina at rest, with the tachycardia 07/05/2011  . Aortic insufficiency 07/05/2011  . Constipation 12/27/2011  . Atrial fibrillation ([place])   . Brady-tachy syndrome ([place])     Dual chamber Medtronic pacemaker Adapta  . Pacemaker   . OA (osteoarthritis)   . Chronic back pain   . Gait instability   .  Tremor   . Parkinson's disease ([place])   . Normal cardiac stress test 2013    Past Surgical History  Procedure Laterality Date  . Permanent pacemaker insertion  07/05/2011    Medtronic Adapta dual chamber  . Cardiac catheterization  05/26/1999    normal  . Nm myoview ltd  07/19/2011    normal  . Colonoscopy N/A 11/15/2012    Procedure: COLONOSCOPY;  Surgeon: [name], MD;  [facility];  Service: Endoscopy;  Laterality: N/A;  1030  . Permanent pacemaker insertion N/A 07/05/2011    Procedure: PERMANENT PACEMAKER INSERTION;  Surgeon: [name], MD;  Location: [facility] CATH LAB;  Service: Cardiovascular;  Laterality: N/A;  . Insert / replace / remove pacemaker    . Abdominal hysterectomy    . Cardiac catheterization  2001    normal coronary arteries     Current Outpatient Prescriptions  Medication Sig Dispense Refill  . carbidopa-levodopa (SINEMET IR) 25-100 MG per tablet Take 1 tablet by mouth 3 (three) times daily.     Marland Kitchen diltiazem (CARDIZEM CD) 180 MG 24 hr capsule Take 1 capsule (180 mg total) by mouth daily. 20 capsule 0  . HYDROcodone-acetaminophen (NORCO/VICODIN) 5-325 MG per tablet Take 1 tablet by mouth every 6 (six) hours as needed for moderate pain.    Marland Kitchen levothyroxine (SYNTHROID, LEVOTHROID) 50 MCG tablet Take 50 mcg by mouth daily.    Marland Kitchen LOTEMAX 0.5 % OINT Apply 1  application to eye at bedtime.    . metoprolol tartrate (LOPRESSOR) 25 MG tablet Take 25 mg by mouth 2 (two) times daily.    . rivaroxaban (XARELTO) 20 MG TABS tablet Take 1 tablet (20 mg total) by mouth daily with supper. 90 tablet 3  . rosuvastatin (CRESTOR) 10 MG tablet Take 10 mg by mouth at bedtime.     No current facility-administered medications for this visit.    Allergies:   Review of patient's allergies indicates no known allergies.    Social History:  The patient  reports that she has never smoked. She has never used smokeless tobacco. She reports that she does not drink alcohol or use  illicit drugs.   Family History:  The patient's family history includes Cancer in her father; Colon cancer (age of onset: 50) in her mother; Heart attack in her mother.    ROS: All other systems are reviewed and negative. Unless otherwise mentioned in H&P    PHYSICAL EXAM: VS:  There were no vitals taken for this visit. , BMI There is no weight on file to calculate BMI. GEN: Well nourished, well developed, in no acute distress HEENT: normal Neck: no JVD, carotid bruits, or masses Cardiac: IRRR; no murmurs, rubs, or gallops,no edema  Respiratory:  clear to auscultation bilaterally, normal work of breathing GI: soft, nontender, nondistended, + BS MS: no deformity or atrophy Skin: warm and dry, no rash Neuro:  Strength and sensation are intact Psych: euthymic mood, full affect   EKG:  The ekg ordered today demonstrates atrial fib with RVR, rate of 123 bpm per minute. Incomplete RBBB.   Recent Labs: 01/06/2015: BUN 15; Creatinine, Ser 0.94; Hemoglobin 14.7; Magnesium 1.8; Platelets 246; Potassium 3.8; Sodium 143; TSH 3.215    Lipid Panel    Component Value Date/Time   CHOL 140 08/09/2012 1142   TRIG 161* 08/09/2012 1142   HDL 40 08/09/2012 1142   CHOLHDL 3.5 08/09/2012 1142   VLDL 32 08/09/2012 1142   LDLCALC 68 08/09/2012 1142      Wt Readings from Last 3 Encounters:  01/06/15 120 lb (54.432 kg)  08/27/14 126 lb 3.2 oz (57.244 kg)  07/03/14 127 lb 4.8 oz (57.743 kg)      ASSESSMENT AND PLAN:  1.  Orthostatic Hypotension: I have spoken to Dr. Domenic Polite on site about medication adjustment. Due to her Parkinson's disease this is a neurologic response with increase HR to compensate. Per his recommendation, we will decrease the diltiazem to 120 mg daily. Give her a Rx for Florinef 0.1 mg daily and have TED hose fitted and placed. It will be important for her to follow up with Dr. Recardo Evangelist for close evaluation of status after these changes. She verbalizes understanding of  medication changes and addition of Florinef and TED hose.   2. Atrial fib with RVR: Response to orthostatic BP. As discussed above, will make changes in diltiazem dose and continue Xarelto. Close follow up with Dr. Recardo Evangelist.   3. Pacemaker in situ: Follow by pacemaker clinic.      Current medicines are reviewed at length with the patient today.    Labs/ tests ordered today include: None No orders of the defined types were placed in this encounter.     Disposition:   FU with DR. Recardo Evangelist in 2 weeks.    Signed, Jory Sims, NP  01/12/2015 7:33 AM    [facility] [address] [place], [place] [address] Phone: [phone]; Fax: [phone]  [phone]  

## 2015-01-12 NOTE — Progress Notes (Signed)
Name: Abigail Taylor    DOB: Feb 23, 1931  Age: 81 y.o.  MR#: [id number]       PCP:  Glo Herring., MD      Insurance: Payor: MEDICARE / Plan: MEDICARE PART A AND B / Product Type: *No Product type* /   CC:   No chief complaint on file.   VS Filed Vitals:   01/12/15 1522  Height: 5\' 6"  (1.676 m)  Weight: 131 lb (59.421 kg)    Weights Current Weight  01/12/15 131 lb (59.421 kg)  01/06/15 120 lb (54.432 kg)  08/27/14 126 lb 3.2 oz (57.244 kg)    Blood Pressure  BP Readings from Last 3 Encounters:  01/06/15 108/66  08/27/14 110/54  07/03/14 100/52     Admit date:  (Not on file) Last encounter with RMR:  Visit date not found   Allergy Review of patient's allergies indicates no known allergies.  Current Outpatient Prescriptions  Medication Sig Dispense Refill  . carbidopa-levodopa (SINEMET IR) 25-100 MG per tablet Take 1 tablet by mouth 3 (three) times daily.     Marland Kitchen diltiazem (CARDIZEM CD) 180 MG 24 hr capsule Take 1 capsule (180 mg total) by mouth daily. 20 capsule 0  . HYDROcodone-acetaminophen (NORCO/VICODIN) 5-325 MG per tablet Take 1 tablet by mouth every 6 (six) hours as needed for moderate pain.    Marland Kitchen levothyroxine (SYNTHROID, LEVOTHROID) 50 MCG tablet Take 50 mcg by mouth daily.    Marland Kitchen LOTEMAX 0.5 % OINT Apply 1 application to eye at bedtime.    . metoprolol tartrate (LOPRESSOR) 25 MG tablet Take 25 mg by mouth 2 (two) times daily.    . rivaroxaban (XARELTO) 20 MG TABS tablet Take 1 tablet (20 mg total) by mouth daily with supper. 90 tablet 3  . rosuvastatin (CRESTOR) 10 MG tablet Take 10 mg by mouth at bedtime.     No current facility-administered medications for this visit.    Discontinued Meds:   There are no discontinued medications.  Patient Active Problem List   Diagnosis Date Noted  . Normal coronary arteries 2001 05/08/2014  . Atrial fibrillation with RVR ([place]) 05/07/2014  . Pacemaker 08/10/2012  . Aortic insufficiency 08/10/2012  . Constipation  12/27/2011  . Family hx of colon cancer 12/27/2011  . Elevated troponin, trace positive secondary to rapid AF 07/06/2011  . Hypothyroidism 07/05/2011  . Paroxysmal atrial fibrillation ([place]) 07/05/2011  . Sinus pause, 7 seconds post conversion, S/P MDT pacemaker 07/05/11 07/05/2011  . Hypercholesterolemia 07/05/2011  . Angina at rest, with rapid AF 07/05/2011    LABS    Component Value Date/Time   NA 143 01/06/2015 0919   NA 142 05/08/2014 0619   NA 144 05/07/2014 1939   K 3.8 01/06/2015 0919   K 3.9 05/08/2014 0619   K 3.5 05/07/2014 1939   CL 108 01/06/2015 0919   CL 110 05/08/2014 0619   CL 106 05/07/2014 1939   CO2 29 01/06/2015 0919   CO2 24 05/08/2014 0619   CO2 25 05/07/2014 1929   GLUCOSE 110* 01/06/2015 0919   GLUCOSE 100* 05/08/2014 0619   GLUCOSE 107* 05/07/2014 1939   BUN 15 01/06/2015 0919   BUN 12 05/08/2014 0619   BUN 17 05/07/2014 1939   CREATININE 0.94 01/06/2015 0919   CREATININE 0.76 05/08/2014 0619   CREATININE 0.80 05/07/2014 1939   CREATININE 0.84 08/09/2012 1142   CALCIUM 9.2 01/06/2015 0919   CALCIUM 8.4 05/08/2014 0619   CALCIUM 9.1 05/07/2014 [place]  55* 01/06/2015 0919   GFRNONAA 77* 05/08/2014 0619   GFRNONAA 61* 05/07/2014 1929   GFRAA >60 01/06/2015 0919   GFRAA 89* 05/08/2014 0619   GFRAA 70* 05/07/2014 1929   CMP     Component Value Date/Time   NA 143 01/06/2015 0919   K 3.8 01/06/2015 0919   CL 108 01/06/2015 0919   CO2 29 01/06/2015 0919   GLUCOSE 110* 01/06/2015 0919   BUN 15 01/06/2015 0919   CREATININE 0.94 01/06/2015 0919   CREATININE 0.84 08/09/2012 1142   CALCIUM 9.2 01/06/2015 0919   PROT 6.2 08/09/2012 1142   ALBUMIN 4.1 08/09/2012 1142   AST 13 08/09/2012 1142   ALT 10 08/09/2012 1142   ALKPHOS 82 08/09/2012 1142   BILITOT 0.7 08/09/2012 1142   GFRNONAA 55* 01/06/2015 0919   GFRAA >60 01/06/2015 0919       Component Value Date/Time   WBC 9.4 01/06/2015 0919   WBC 6.1 05/08/2014 0619   WBC 9.3  05/07/2014 1929   HGB 14.7 01/06/2015 0919   HGB 12.2 05/08/2014 0619   HGB 13.6 05/07/2014 1939   HCT 44.1 01/06/2015 0919   HCT 37.0 05/08/2014 0619   HCT 40.0 05/07/2014 1939   MCV 87.0 01/06/2015 0919   MCV 85.3 05/08/2014 0619   MCV 84.5 05/07/2014 1929    Lipid Panel     Component Value Date/Time   CHOL 140 08/09/2012 1142   TRIG 161* 08/09/2012 1142   HDL 40 08/09/2012 1142   CHOLHDL 3.5 08/09/2012 1142   VLDL 32 08/09/2012 1142   LDLCALC 68 08/09/2012 1142    ABG    Component Value Date/Time   TCO2 21 05/07/2014 1939     Lab Results  Component Value Date   TSH 3.215 01/06/2015   BNP (last 3 results) No results for input(s): BNP in the last 8760 hours.  ProBNP (last 3 results) No results for input(s): PROBNP in the last 8760 hours.  Cardiac Panel (last 3 results) No results for input(s): CKTOTAL, CKMB, TROPONINI, RELINDX in the last 72 hours.  Iron/TIBC/Ferritin/ %Sat No results found for: IRON, TIBC, FERRITIN, IRONPCTSAT   EKG Orders placed or performed in visit on 01/12/15  . EKG 12-Lead     Prior Assessment and Plan Problem List as of 01/12/2015      Cardiovascular and Mediastinum   Paroxysmal atrial fibrillation [facility])   Last Assessment & Plan 08/09/2012 Office Visit Written 08/10/2012  7:48 AM by [name], MD    No recurrences have been noted since her last pacemaker check. She has only borderline indications for warfarin anticoagulation, based primarily on her age. She does not have systemic hypertension, diabetes, congestive heart failure or a history of previous embolic event. Only one episode of paroxysmal atrial fibrillation has been documented. We'll plan to continue aspirin anticoagulations but is high prevalence of atrial fibrillation is noted on her pacemaker checks we might reconsider.      Sinus pause, 7 seconds post conversion, S/P MDT pacemaker 07/05/11   Angina at rest, with rapid AF   Aortic insufficiency   Last Assessment &  Plan 08/09/2012 Office Visit Written 08/10/2012  7:53 AM by [name], MD    Mild-to-moderate without evidence of rapid change by serial echocardiography. This is unlikely to be clinically relevant and is unlikely to be responsible for her atrial fibrillation.      Atrial fibrillation with RVR [facility])     Digestive   Constipation     Endocrine  Hypothyroidism     Other   Hypercholesterolemia   Last Assessment & Plan 08/09/2012 Office Visit Written 08/10/2012  7:49 AM by [name], MD    She is on highly active statin therapy and her most recent lipid profile showed parameters within the desirable range (cholesterol 163, triglycerides 105, HDL 43, LDL 99.)      Elevated troponin, trace positive secondary to rapid AF   Family hx of colon cancer   Pacemaker   Normal coronary arteries 2001       Imaging: Dg Chest Portable 1 View  01/06/2015  CLINICAL DATA:  Chest pain, irregular heart beats EXAM: PORTABLE CHEST 1 VIEW COMPARISON:  05/07/2014 FINDINGS: Cardiomediastinal silhouette is stable. Dual lead cardiac pacemaker is unchanged in position. No acute infiltrate or pleural effusion. No pulmonary edema. Mild hyperinflation again noted. IMPRESSION: No active disease.  Mild hyperinflation again noted. Electronically Signed   By: Lahoma Crocker M.D.   On: 01/06/2015 09:45

## 2015-01-12 NOTE — Telephone Encounter (Signed)
She is having heart palpatations and can not continue at this time, she just left [facility] now. If PT wants to call to check on her they can they will be at home. NF

## 2015-01-13 ENCOUNTER — Ambulatory Visit (HOSPITAL_COMMUNITY): Payer: Medicare Other

## 2015-01-14 ENCOUNTER — Encounter: Payer: Self-pay | Admitting: Cardiology

## 2015-01-14 LAB — CUP PACEART REMOTE DEVICE CHECK
Battery Impedance: 207 Ohm
Brady Statistic AP VP Percent: 0 %
Brady Statistic AP VS Percent: 50 %
Brady Statistic AS VP Percent: 0 %
Brady Statistic AS VS Percent: 50 %
Implantable Lead Implant Date: 20130611
Implantable Lead Location: 753859
Implantable Lead Location: 753860
Lead Channel Impedance Value: 594 Ohm
Lead Channel Pacing Threshold Amplitude: 0.625 V
Lead Channel Pacing Threshold Amplitude: 0.75 V
Lead Channel Pacing Threshold Pulse Width: 0.4 ms
Lead Channel Sensing Intrinsic Amplitude: 1 mV
Lead Channel Sensing Intrinsic Amplitude: 11.2 mV
Lead Channel Setting Sensing Sensitivity: 5.6 mV
MDC IDC LEAD IMPLANT DT: 20130611
MDC IDC MSMT BATTERY REMAINING LONGEVITY: 140 mo
MDC IDC MSMT BATTERY VOLTAGE: 2.79 V
MDC IDC MSMT LEADCHNL RA IMPEDANCE VALUE: 447 Ohm
MDC IDC MSMT LEADCHNL RV PACING THRESHOLD PULSEWIDTH: 0.4 ms
MDC IDC SESS DTM: 20161216180603
MDC IDC SET LEADCHNL RA PACING AMPLITUDE: 1.5 V
MDC IDC SET LEADCHNL RV PACING AMPLITUDE: 2 V
MDC IDC SET LEADCHNL RV PACING PULSEWIDTH: 0.46 ms

## 2015-01-15 ENCOUNTER — Encounter (HOSPITAL_COMMUNITY): Payer: Medicare Other | Admitting: Physical Therapy

## 2015-01-16 ENCOUNTER — Encounter: Payer: Self-pay | Admitting: Cardiovascular Disease

## 2015-01-16 ENCOUNTER — Ambulatory Visit (INDEPENDENT_AMBULATORY_CARE_PROVIDER_SITE_OTHER): Payer: Medicare Other | Admitting: Cardiovascular Disease

## 2015-01-16 VITALS — BP 128/82 | HR 81 | Ht 66.0 in | Wt 133.2 lb

## 2015-01-16 DIAGNOSIS — Z950 Presence of cardiac pacemaker: Secondary | ICD-10-CM | POA: Diagnosis not present

## 2015-01-16 DIAGNOSIS — E7800 Pure hypercholesterolemia, unspecified: Secondary | ICD-10-CM

## 2015-01-16 DIAGNOSIS — I481 Persistent atrial fibrillation: Secondary | ICD-10-CM

## 2015-01-16 DIAGNOSIS — G903 Multi-system degeneration of the autonomic nervous system: Secondary | ICD-10-CM | POA: Diagnosis not present

## 2015-01-16 DIAGNOSIS — I4819 Other persistent atrial fibrillation: Secondary | ICD-10-CM

## 2015-01-16 DIAGNOSIS — I959 Hypotension, unspecified: Secondary | ICD-10-CM | POA: Insufficient documentation

## 2015-01-16 NOTE — Patient Instructions (Signed)
Your physician wants you to follow-up in: 3 Month with Pacer check. You will receive a reminder letter in the mail two months in advance. If you don't receive a letter, please call our office to schedule the follow-up appointment.  If you go back into A-Fib take Diltiazem 240 mg a day and do a download so we can know your in A-Fib  Merry Christmas and [place]!!

## 2015-01-16 NOTE — Progress Notes (Signed)
Patient ID: Abigail Taylor, female   DOB: 1931-10-12, 81 y.o.   MRN: [id number]     Cardiology Office Note    Date:  01/16/2015   ID:  Abigail Taylor, DOB 11-11-1931, MRN [id number]  PCP:  Glo Herring., MD  Cardiologist:   [name], MD   No chief complaint on file.   History of Present Illness:  Abigail Taylor is a 81 y.o. female who presents in follow up for atrial fibrillation  She was seen in [facility] ED and the office with symptomatic atrial fibrillation with RVR and management was difficult due to hypotension, at least in part felt to be due to hypovolemia. She received fluids. She has been started on fludrocortisone. She felt poorly until 2 days ago and is now back to normal. Her symptoms coincide with a one week long episode of atrial fibrillation that resolved two days ago, per pacemaker interrogation. Today her intracardiac electrograms show atrial sensed rhythm with frequent PACs and 1: AV conduction (prior to AF, 50% ASVS, 50% APVS, <0.2% V paced).  She received a or tachycardia-bradycardia syndrome with history of sinus arrest status post implantation of a dual-chamber permanent pacemaker and paroxysmal atrial fibrillation. CHADSVasc score 3 (age 61, gender). She has normal left ventricular systolic function and did not have coronary artery disease by remote angiography and a normal nuclear stress test in 2013. She was briefly hospitalized for self terminated atrial fibrillation in April 2016 and was seen in the ED January 06, 2015.   Past Medical History  Diagnosis Date  . Hypothyroidism   . Hyperlipidemia   . Angina at rest, with the tachycardia 07/05/2011  . Aortic insufficiency 07/05/2011  . Constipation 12/27/2011  . Atrial fibrillation ([place])   . Brady-tachy syndrome ([place])     Dual chamber Medtronic pacemaker Adapta  . Pacemaker   . OA (osteoarthritis)   . Chronic back pain   . Gait instability   . Tremor   . Parkinson's disease ([place])   . Normal  cardiac stress test 2013    Past Surgical History  Procedure Laterality Date  . Permanent pacemaker insertion  07/05/2011    Medtronic Adapta dual chamber  . Cardiac catheterization  05/26/1999    normal  . Nm myoview ltd  07/19/2011    normal  . Colonoscopy N/A 11/15/2012    Procedure: COLONOSCOPY;  Surgeon: [name], MD;  Location: [facility];  Service: Endoscopy;  Laterality: N/A;  1030  . Permanent pacemaker insertion N/A 07/05/2011    Procedure: PERMANENT PACEMAKER INSERTION;  Surgeon: [name], MD;  Location: [place] CATH LAB;  Service: Cardiovascular;  Laterality: N/A;  . Insert / replace / remove pacemaker    . Abdominal hysterectomy    . Cardiac catheterization  2001    normal coronary arteries    Current Outpatient Prescriptions  Medication Sig Dispense Refill  . carbidopa-levodopa (SINEMET IR) 25-100 MG per tablet Take 1 tablet by mouth 3 (three) times daily.     Marland Kitchen diltiazem (CARDIZEM CD) 120 MG 24 hr capsule Take 1 capsule (120 mg total) by mouth daily. 90 capsule 3  . fludrocortisone (FLORINEF) 0.1 MG tablet Take 1 tablet (0.1 mg total) by mouth daily. 90 tablet 3  . levothyroxine (SYNTHROID, LEVOTHROID) 50 MCG tablet Take 50 mcg by mouth daily.    . rivaroxaban (XARELTO) 20 MG TABS tablet Take 1 tablet (20 mg total) by mouth daily with supper. 90 tablet 3  . rosuvastatin (CRESTOR) 10 MG tablet  Take 10 mg by mouth at bedtime.     No current facility-administered medications for this visit.    Allergies:   Review of patient's allergies indicates no known allergies.   Social History   Social History  . Marital Status: Married    Spouse Name: N/A  . Number of Children: N/A  . Years of Education: N/A   Social History Main Topics  . Smoking status: Never Smoker   . Smokeless tobacco: Never Used  . Alcohol Use: No  . Drug Use: No  . Sexual Activity: Not Asked   Other Topics Concern  . None   Social History Narrative     Family History:  The  patient's family history includes Cancer in her father; Colon cancer (age of onset: 17) in her mother; Heart attack in her mother.   ROS:   Please see the history of present illness.    Review of Systems  Neurological: Positive for loss of balance.   All other systems reviewed and are negative.   PHYSICAL EXAM:   VS:  BP 128/82 mmHg  Pulse 81  Ht 5\' 6"  (1.676 m)  Wt 133 lb 3.2 oz (60.419 kg)  BMI 21.51 kg/m2  SpO2 96%   GEN: Well nourished, well developed, in no acute distress HEENT: normal Neck: no JVD, carotid bruits, or masses Cardiac: RRR with occasional ectopy; no murmurs, rubs, or gallops,no edema  Respiratory:  clear to auscultation bilaterally, normal work of breathing GI: soft, nontender, nondistended, + BS MS: no deformity or atrophy Skin: warm and dry, no rash Neuro:  Alert and Oriented x 3, Strength and sensation are intact Psych: euthymic mood, full affect  Wt Readings from Last 3 Encounters:  01/16/15 133 lb 3.2 oz (60.419 kg)  01/12/15 131 lb (59.421 kg)  01/06/15 120 lb (54.432 kg)      Studies/Labs Reviewed:   EKG:  EKG is not ordered today.    Recent Labs: 01/06/2015: BUN 15; Creatinine, Ser 0.94; Hemoglobin 14.7; Magnesium 1.8; Platelets 246; Potassium 3.8; Sodium 143; TSH 3.215   Lipid Panel    Component Value Date/Time   CHOL 140 08/09/2012 1142   TRIG 161* 08/09/2012 1142   HDL 40 08/09/2012 1142   CHOLHDL 3.5 08/09/2012 1142   VLDL 32 08/09/2012 1142   LDLCALC 68 08/09/2012 1142    Additional studies/ records that were reviewed today include:  Records from [place] clinic    ASSESSMENT:    1. Persistent atrial fibrillation with rapid ventricular response ([place])   2. Pacemaker   3. Hypercholesterolemia   4. Neurogenic orthostatic hypotension (H[facility])      PLAN:  In order of problems listed above:  1. Prolonged episode of AF with RVR: Feeling much better after spontaneous arrhythmia resolution. Low BP limits use of higher doses  of AV node blocking agents. The low frequency of the arrhythmia does not justify risky antiarrhythmics. For the time being, asked her to send transmission if rapid heart rate recurs and we will temporarily double the dose of diltiazem. If atrial fibrillation prevalence increases, consider amiodarone or dofetilide. Embolic risk is elevated, continue DOAC. She pays >$100 for a monthly Xarelto Rx. OK to switch to alternative DOAC if cheaper for her - she will check with her pharmacy. 2. Normal pacemaker function. She can do additional ad hoc downloads if needed for arrhythmia diagnosis. 3. Lipids not checked in a while, reevaluate with next blood draw. Continue statin 4. Hypotension may be Parkinson's disease-related/ autonomic dysfunction, in  addition to hypovolemia. Currently BP well controlled on low dose fludrocortisone and no sign of hypervolemia (her labs did not really suggest hypovolemia, BUN 15). She is unable to put on the compression stockings. Encourage good hydration and liberal salt diet.   Medication Adjustments/Labs and Tests Ordered: Current medicines are reviewed at length with the patient today.  Concerns regarding medicines are outlined above.  Medication changes, Labs and Tests ordered today are listed below. Patient Instructions  Your physician wants you to follow-up in: 3 Month with Pacer check. You will receive a reminder letter in the mail two months in advance. If you don't receive a letter, please call our office to schedule the follow-up appointment.  If you go back into A-Fib take Diltiazem 240 mg a day and do a download so we can know your in A-Fib  [place] and Happy New YearMikael Spray, MD  01/16/2015 4:28 PM    [facility] [address], [place], [place]  [address] Phone: [phone]; Fax: [phone]

## 2015-01-20 ENCOUNTER — Ambulatory Visit (HOSPITAL_COMMUNITY): Payer: Medicare Other

## 2015-01-20 DIAGNOSIS — M25552 Pain in left hip: Secondary | ICD-10-CM | POA: Diagnosis not present

## 2015-01-20 DIAGNOSIS — R2681 Unsteadiness on feet: Secondary | ICD-10-CM | POA: Diagnosis not present

## 2015-01-20 DIAGNOSIS — R498 Other voice and resonance disorders: Secondary | ICD-10-CM

## 2015-01-20 DIAGNOSIS — M62830 Muscle spasm of back: Secondary | ICD-10-CM

## 2015-01-20 DIAGNOSIS — R2689 Other abnormalities of gait and mobility: Secondary | ICD-10-CM

## 2015-01-20 DIAGNOSIS — R6889 Other general symptoms and signs: Secondary | ICD-10-CM

## 2015-01-20 DIAGNOSIS — R251 Tremor, unspecified: Secondary | ICD-10-CM

## 2015-01-20 DIAGNOSIS — Z789 Other specified health status: Secondary | ICD-10-CM

## 2015-01-20 DIAGNOSIS — M5442 Lumbago with sciatica, left side: Secondary | ICD-10-CM | POA: Diagnosis not present

## 2015-01-20 NOTE — Therapy (Signed)
[place], [place], [address] Phone: [phone]   Fax:  [phone]  Physical Therapy Treatment  Patient Details  Name: Abigail Taylor MRN: [id number] Date of Birth: 1931-06-14 Referring Provider: Ron Agee  Encounter Date: 01/20/2015      PT End of Session - 01/20/15 0908    Visit Number 7   Number of Visits 16   Date for PT Re-Evaluation 02/10/15   Authorization Type Medicare    Authorization Time Period 12/11/14-02/10/15   Authorization - Visit Number 7   Authorization - Number of Visits 10   PT Start Time [id number]   PT Stop Time 0927   PT Time Calculation (min) 40 min   Activity Tolerance Patient tolerated treatment well   Behavior During Therapy [facility] for tasks assessed/performed      Past Medical History  Diagnosis Date  . Hypothyroidism   . Hyperlipidemia   . Angina at rest, with the tachycardia 07/05/2011  . Aortic insufficiency 07/05/2011  . Constipation 12/27/2011  . Atrial fibrillation ([place])   . Brady-tachy syndrome ([place])     Dual chamber Medtronic pacemaker Adapta  . Pacemaker   . OA (osteoarthritis)   . Chronic back pain   . Gait instability   . Tremor   . Parkinson's disease ([place])   . Normal cardiac stress test 2013    Past Surgical History  Procedure Laterality Date  . Permanent pacemaker insertion  07/05/2011    Medtronic Adapta dual chamber  . Cardiac catheterization  05/26/1999    normal  . Nm myoview ltd  07/19/2011    normal  . Colonoscopy N/A 11/15/2012    Procedure: COLONOSCOPY;  Surgeon: [name], MD;  [facility];  Service: Endoscopy;  Laterality: N/A;  1030  . Permanent pacemaker insertion N/A 07/05/2011    Procedure: PERMANENT PACEMAKER INSERTION;  Surgeon: [name], MD;  Location: [place] CATH [facility];  Service: Cardiovascular;  Laterality: N/A;  . Insert / replace / remove pacemaker    . Abdominal hysterectomy    . Cardiac catheterization  2001    normal  coronary arteries    There were no vitals filed for this visit.  Visit Diagnosis:  Left-sided low back pain with left-sided sciatica  Pain in joint, pelvic region and thigh, left  Spasm of back muscles  Decreased back mobility  Unsteadiness on feet  Hypophonia  Difficulty navigating stairs  Tremor      Subjective Assessment - 01/20/15 0850    Subjective Pt has seen cardiology/PCP a few times sicne last visit in attempts to resolve afib c RVR; initital attempts to change meds resulted in unstable hypotension, with some continued afib c RVR. Pt reports she has been symptom free for several days now. Pt reports she maintains back pain relief in the interim and now has resumed HEP.    Pertinent History Pt reports back pain for more than 30 years, with most activity. Finally worsened enough to seek medical attention in January.  Has had injections, some of which were helpful. Reporting 5/10 pain in L PSIS. Resolves with sitting. Has sciatic symptoms into posterior sciatic nn, distribution. Walking causes repeated shooting sciatic pain. Occasional pain with lying down, but able to find a comfortable position. Pt also reporting a new diagnosis of PD within the last year.    Limitations Sitting;Walking   How long can you stand comfortably? 25 minutes, making potato salad Christmas weekend. Previously described as immediate discomfort with  standing at evaulation.    How long can you walk comfortably? Walked about 30 minutes outside Christmas weekend, with only mild increase in back pain. Previosuly 5 minutes at evaluation.    Patient Stated Goals resolve pain, improve ability to walk.    Currently in Pain? No/denies   Pain Score 0-No pain   Pain Location Back   Pain Orientation Left                         [facility] Adult PT Treatment/Exercise - 01/20/15 0001    Ambulation/Gait   Ambulation Distance (Feet) 675 Feet   Gait Comments 2MWT: (480ft)   Exercises   Exercises  Lumbar   Lumbar Exercises: Stretches   Active Hamstring Stretch 3 reps;30 seconds   Active Hamstring Stretch Limitations Standing on 12" block   Passive Hamstring Stretch 30 seconds;3 reps  calf stretch on slant board   Single Knee to Chest Stretch 30 seconds;2 reps   Lower Trunk Rotation 10 seconds   Lower Trunk Rotation Limitations 10 reps x 10"   Lumbar Exercises: Seated   Sit to Stand 15 reps  tactile and verbal cues for form    Lumbar Exercises: Supine   Bent Knee Raise 15 reps  alternating sides c TrAbd activation.    Bridge 15 reps   Bridge Limitations 1x15 + clam c red TB; 1x15 c ball squeeze    Other Supine Lumbar Exercises Cervical Retraction into 1 pillow  15x3sec   Other Supine Lumbar Exercises Supine bilat shoulder flexion c PVC   1x15, 2#   Lumbar Exercises: Sidelying   Hip Abduction 20 reps  Supine today for form: 2x10 c red TB    Knee/Hip Exercises: Seated   Other Seated Knee/Hip Exercises Sitting on dynadisc   ball tosses 15x fwd, 15x R45, 15x L45             Balance Exercises - 01/20/15 0927    Balance Exercises: Standing   Standing Eyes Opened Narrow base of support (BOS);Cognitive challenge  20 ball tosses forward; 15 to R 45*, 15 to L 45*           PT Education - 01/20/15 0908    Education provided No          PT Short Term Goals - 01/20/15 0913    PT SHORT TERM GOAL #1   Title By week two patient will demonstrate indep in an entry level HEP performing at least 4-6 days weeklty.    Status Achieved   PT SHORT TERM GOAL #2   Title By week three patient will ambulate 477feet in 2 minutes to demonstrate safe limited community distances.    Baseline Pt ambulating 42ft in 2 minutes on 12/27   Status Achieved   PT SHORT TERM GOAL #3   Title By week 3, patient will demonstrate the ability to perform standing at home for 1 hour or greater free of pain or symptoms to improve ability to performing housework and  kitchen activities.   Baseline  Reduced from immediate pain upon standing to tolerance of 30-45 minutes without exacerbation.    Status On-going           PT Long Term Goals - 01/20/15 0929    PT LONG TERM GOAL #1   Title By the end of week 5, pt will demonstrate independence in an advanced HEP to continue progress after DC from therapy.    Status On-going  PT LONG TERM GOAL #2   Title By the end of 5 weeks, patient will tolerate a six miute walk test, covering 147ft with less than 3/10 pain to demonstrate ability to safely perform  IADL.    Status On-going   PT LONG TERM GOAL #3   Title By week five pt will improve functional stregth as evidenced by 5x sit to stand in les than 8 seconds.    Status On-going               Plan - 02-19-2015 0910    Clinical Impression Statement Pt doing well today maintaining progress sicne last session due to missed visits related to medical problems. Pt continues to respond well to interventions, and increase activity without exaceration. Balance activities look more fluent today, and ambualtion has more trunk and arm swing.  Progress toward goals is excellent as evidenced by return to more active lifestyle at home without limittiations from pain.    Pt will benefit from skilled therapeutic intervention in order to improve on the following deficits Abnormal gait;Decreased activity tolerance;Decreased range of motion;Decreased mobility;Difficulty walking;Impaired flexibility;Improper body mechanics;Postural dysfunction;Pain   Rehab Potential Good   Clinical Impairments Affecting Rehab Potential chronicity of current condition.    PT Frequency 2x / week   PT Treatment/Interventions Moist Heat;Therapeutic exercise;Therapeutic activities;Gait training;Functional mobility training;Patient/family education;Manual techniques   PT Next Visit Plan Continue to progress seated ball tosses and other dynamic balance activites.    PT Home Exercise Plan No changes.   Consulted and Agree with  Plan of Care Patient          G-Codes - 02-19-15 0908    Functional Assessment Tool Used Clinical Judgement   Functional Limitation Mobility: Walking and moving around   Mobility: Walking and Moving Around Current Status [phone]) At least 40 percent but less than 60 percent impaired, limited or restricted   Mobility: Walking and Moving Around Goal Status [phone]) At least 20 percent but less than 40 percent impaired, limited or restricted      Problem List Patient Active Problem List   Diagnosis Date Noted  . Persistent atrial fibrillation with rapid ventricular response ([place]) 01/16/2015  . Hypotension arterial 01/16/2015  . Normal coronary arteries 2001 05/08/2014  . Atrial fibrillation with RVR ([place]) 05/07/2014  . Pacemaker 08/10/2012  . Aortic insufficiency 08/10/2012  . Constipation 12/27/2011  . Family hx of colon cancer 12/27/2011  . Elevated troponin, trace positive secondary to rapid AF 07/06/2011  . Hypothyroidism 07/05/2011  . Paroxysmal atrial fibrillation ([place]) 07/05/2011  . Sinus pause, 7 seconds post conversion, S/P MDT pacemaker 07/05/11 07/05/2011  . Hypercholesterolemia 07/05/2011  . Angina at rest, with rapid AF 07/05/2011    Buccola,Allan C Feb 19, 2015, 9:31 AM  9:31 AM  Etta Grandchild, PT, DPT [place] License # [id number]       [facility] [place], [place], [address] Phone: [phone]   Fax:  [phone]  Name: Abigail Taylor MRN: [id number] Date of Birth: [birth year]

## 2015-01-22 ENCOUNTER — Ambulatory Visit (HOSPITAL_COMMUNITY): Payer: Medicare Other

## 2015-01-22 DIAGNOSIS — M5442 Lumbago with sciatica, left side: Secondary | ICD-10-CM

## 2015-01-22 DIAGNOSIS — M25552 Pain in left hip: Secondary | ICD-10-CM | POA: Diagnosis not present

## 2015-01-22 DIAGNOSIS — R2689 Other abnormalities of gait and mobility: Secondary | ICD-10-CM

## 2015-01-22 DIAGNOSIS — R2681 Unsteadiness on feet: Secondary | ICD-10-CM

## 2015-01-22 DIAGNOSIS — Z789 Other specified health status: Secondary | ICD-10-CM

## 2015-01-22 DIAGNOSIS — R251 Tremor, unspecified: Secondary | ICD-10-CM

## 2015-01-22 DIAGNOSIS — R498 Other voice and resonance disorders: Secondary | ICD-10-CM

## 2015-01-22 DIAGNOSIS — M62830 Muscle spasm of back: Secondary | ICD-10-CM

## 2015-01-22 DIAGNOSIS — R6889 Other general symptoms and signs: Secondary | ICD-10-CM

## 2015-01-22 NOTE — Patient Instructions (Signed)
   [place] Squat Stretch  Sitting in chair, bring hands to floor as you bend forward. Place elbows in the inside of your knees and push outward. Hold for 30 seconds and then rest. Repeat 4x, twice daily.

## 2015-01-22 NOTE — Therapy (Signed)
[place]bigail [place], [place], [address] Phone: [phone]   Fax:  [phone]  Physical Therapy Treatment  Patient Details  Name: Abigail Taylor MRN: [id number] Date of Birth: November 29, 1931 Referring Provider: Ron Agee  Encounter Date: 01/22/2015      PT End of Session - 01/22/15 0911    Visit Number 8   Number of Visits 16   Date for PT Re-Evaluation 02/10/15   Authorization Type Medicare    Authorization Time Period 12/11/14-02/10/15   Authorization - Visit Number 8   Authorization - Number of Visits 10   PT Start Time 0850   PT Stop Time 0928   PT Time Calculation (min) 38 min   Equipment Utilized During Treatment Gait belt   Activity Tolerance Patient tolerated treatment well   Behavior During Therapy [facility] for tasks assessed/performed      Past Medical History  Diagnosis Date  . Hypothyroidism   . Hyperlipidemia   . Angina at rest, with the tachycardia 07/05/2011  . Aortic insufficiency 07/05/2011  . Constipation 12/27/2011  . Atrial fibrillation ([place])   . Brady-tachy syndrome ([place])     Dual chamber Medtronic pacemaker Adapta  . Pacemaker   . OA (osteoarthritis)   . Chronic back pain   . Gait instability   . Tremor   . Parkinson's disease ([place])   . Normal cardiac stress test 2013    Past Surgical History  Procedure Laterality Date  . Permanent pacemaker insertion  07/05/2011    Medtronic Adapta dual chamber  . Cardiac catheterization  05/26/1999    normal  . Nm myoview ltd  07/19/2011    normal  . Colonoscopy N/A 11/15/2012    Procedure: COLONOSCOPY;  Surgeon: [name], MD;  [facility];  Service: Endoscopy;  Laterality: N/A;  1030  . Permanent pacemaker insertion N/A 07/05/2011    Procedure: PERMANENT PACEMAKER INSERTION;  Surgeon: [name], MD;  Location: [place] CATH LAB;  Service: Cardiovascular;  Laterality: N/A;  . Insert / replace / remove pacemaker    . Abdominal hysterectomy     . Cardiac catheterization  2001    normal coronary arteries    There were no vitals filed for this visit.  Visit Diagnosis:  Left-sided low back pain with left-sided sciatica  Pain in joint, pelvic region and thigh, left  Spasm of back muscles  Decreased back mobility  Unsteadiness on feet  Hypophonia  Difficulty navigating stairs  Tremor      Subjective Assessment - 01/22/15 0859    Subjective Pt reports feeling well today. She says she has had some intermittent sciatic pain on the Left with sit to stand. She reports good compliance with HEP and continues to walk outside when the weather is nice. No cardiac compaints since last session.    Currently in Pain? No/denies   Pain Score 0-No pain                         [facility] Adult PT Treatment/Exercise - 01/22/15 0001    Ambulation/Gait   Ambulation/Gait Yes   Gait Comments 6 minutes, VC for step length, arm swing, navigating obstacles.    Exercises   Exercises Lumbar   Lumbar Exercises: Stretches   Single Knee to Chest Stretch 30 seconds;2 reps   Lower Trunk Rotation 10 seconds   Lower Trunk Rotation Limitations 10 reps x 10"   Lumbar Exercises: Standing   Functional Squats  10 reps  2x10 + upward ball thrust   Lumbar Exercises: Supine   Bridge 15 reps  x4   Bridge Limitations 2x15 + clam c red TB; 2x15 c ball squeeze    Other Supine Lumbar Exercises Cervical Retraction into 1 pillow  15x3sec   Other Supine Lumbar Exercises supine towel roll stretch 10 minutes  longitudinal towel roll   Lumbar Exercises: Sidelying   Hip Abduction 20 reps  Supine today for form: 2x10 c red TB              Balance Exercises - 01/22/15 0930    Balance Exercises: Standing   Other Standing Exercises LOB recovery step training: 8 FWD, 5 L , 5 R  absent festination.            PT Education - 01/22/15 0911    Education provided No          PT Short Term Goals - 01/20/15 0913    PT SHORT TERM GOAL  #1   Title By week two patient will demonstrate indep in an entry level HEP performing at least 4-6 days weeklty.    Status Achieved   PT SHORT TERM GOAL #2   Title By week three patient will ambulate 469feet in 2 minutes to demonstrate safe limited community distances.    Baseline Pt ambulating 464ft in 2 minutes on 12/27   Status Achieved   PT SHORT TERM GOAL #3   Title By week 3, patient will demonstrate the ability to perform standing at home for 1 hour or greater free of pain or symptoms to improve ability to performing housework and  kitchen activities.   Baseline Reduced from immediate pain upon standing to tolerance of 30-45 minutes without exacerbation.    Status On-going           PT Long Term Goals - 01/20/15 0929    PT LONG TERM GOAL #1   Title By the end of week 5, pt will demonstrate independence in an advanced HEP to continue progress after DC from therapy.    Status On-going   PT LONG TERM GOAL #2   Title By the end of 5 weeks, patient will tolerate a six miute walk test, covering 1480ft with less than 3/10 pain to demonstrate ability to safely perform  IADL.    Status On-going   PT LONG TERM GOAL #3   Title By week five pt will improve functional stregth as evidenced by 5x sit to stand in les than 8 seconds.    Status On-going               Plan - 01/22/15 0933    Clinical Impression Statement Pt tolerating session well today, able to progress most activities as planned without exacerbation. Mild L sciatic pain with soem of the bridging, but able to modify hip/knee anlge to reduce. Pt remains rigid in the trunk and neck, and somewhat brady kinetic, but is able to modify with with verbal cuing and carefully guided activities, such as ball tossses. Balance continues to improve overall. Compensation stepping strategy training initiated today, with success, gradaully improving explosive power movements without festination.    Pt will benefit from skilled  therapeutic intervention in order to improve on the following deficits Abnormal gait;Decreased activity tolerance;Decreased range of motion;Decreased mobility;Difficulty walking;Impaired flexibility;Improper body mechanics;Postural dysfunction;Pain   Rehab Potential Good   Clinical Impairments Affecting Rehab Potential chronicity of current condition.    PT Frequency 2x / week  PT Treatment/Interventions Moist Heat;Therapeutic exercise;Therapeutic activities;Gait training;Functional mobility training;Patient/family education;Manual techniques   PT Next Visit Plan continue with airex ball toss, focus on high tosses that promote shoulder and trunk extension and 45 degree angles to promote cervical/trunk rotation.   PT Home Exercise Plan Added Sumo Squat for high adductor stretching. Pt reports 'feels good.' Next session should review HEP in full, DC ones no longer needed, and continue to add-on advanced activites for DC HEP.    Consulted and Agree with Plan of Care Patient        Problem List Patient Active Problem List   Diagnosis Date Noted  . Persistent atrial fibrillation with rapid ventricular response ([place]) 01/16/2015  . Hypotension arterial 01/16/2015  . Normal coronary arteries 2001 05/08/2014  . Atrial fibrillation with RVR ([place]) 05/07/2014  . Pacemaker 08/10/2012  . Aortic insufficiency 08/10/2012  . Constipation 12/27/2011  . Family hx of colon cancer 12/27/2011  . Elevated troponin, trace positive secondary to rapid AF 07/06/2011  . Hypothyroidism 07/05/2011  . Paroxysmal atrial fibrillation ([place]) 07/05/2011  . Sinus pause, 7 seconds post conversion, S/P MDT pacemaker 07/05/11 07/05/2011  . Hypercholesterolemia 07/05/2011  . Angina at rest, with rapid AF 07/05/2011    Jamian Andujo C 01/22/2015, 9:39 AM 9:39 AM  Etta Grandchild, PT, DPT [place] License # [id number]       [facility] [place], [place], [address] Phone:  [phone]   Fax:  [phone]  Name: ANTONIETA PARAMO MRN: [id number] Date of Birth: [birth year]

## 2015-01-27 ENCOUNTER — Ambulatory Visit: Payer: Medicare Other | Admitting: Physician Assistant

## 2015-01-29 ENCOUNTER — Ambulatory Visit (HOSPITAL_COMMUNITY): Payer: Medicare Other | Attending: Physical Medicine and Rehabilitation | Admitting: Physical Therapy

## 2015-01-29 DIAGNOSIS — R498 Other voice and resonance disorders: Secondary | ICD-10-CM

## 2015-01-29 DIAGNOSIS — Z789 Other specified health status: Secondary | ICD-10-CM

## 2015-01-29 DIAGNOSIS — M25552 Pain in left hip: Secondary | ICD-10-CM | POA: Diagnosis not present

## 2015-01-29 DIAGNOSIS — R251 Tremor, unspecified: Secondary | ICD-10-CM | POA: Diagnosis not present

## 2015-01-29 DIAGNOSIS — M62830 Muscle spasm of back: Secondary | ICD-10-CM | POA: Diagnosis not present

## 2015-01-29 DIAGNOSIS — R2681 Unsteadiness on feet: Secondary | ICD-10-CM | POA: Diagnosis not present

## 2015-01-29 DIAGNOSIS — R2689 Other abnormalities of gait and mobility: Secondary | ICD-10-CM | POA: Insufficient documentation

## 2015-01-29 DIAGNOSIS — Z658 Other specified problems related to psychosocial circumstances: Secondary | ICD-10-CM | POA: Insufficient documentation

## 2015-01-29 DIAGNOSIS — M5442 Lumbago with sciatica, left side: Secondary | ICD-10-CM | POA: Insufficient documentation

## 2015-01-29 DIAGNOSIS — R6889 Other general symptoms and signs: Secondary | ICD-10-CM

## 2015-01-29 NOTE — Therapy (Addendum)
PHYSICAL THERAPY DISCHARGE SUMMARY  Visits from Start of Care: 9  Current functional level related to goals / functional outcomes: *see below   Remaining deficits:  *see below    Education / Equipment: *see below Plan:                                                    Patient goals were not met. Patient is being discharged due to not returning since the last visit.  ?????    4:38 PM, 03/18/2015 Etta Grandchild, PT, DPT PRN Physical Therapist at [facility] License # [id number] [phone] (wireless)  [phone] (mobile)         [facility] [address] [place], [place], [address] Phone: [phone]   Fax:  [phone]  Physical Therapy Treatment  Patient Details  Name: Abigail Taylor MRN: [id number] Date of Birth: 06-Nov-1931 Referring Provider: Ron Agee  Encounter Date: 01/29/2015      PT End of Session - 01/29/15 1218    Visit Number 9   Number of Visits 16   Date for PT Re-Evaluation 02/10/15   Authorization Type Medicare    Authorization Time Period 12/11/14-02/10/15   Authorization - Visit Number 9   Authorization - Number of Visits 10   PT Start Time 1110   PT Stop Time 1148   PT Time Calculation (min) 38 min   Equipment Utilized During Treatment Gait belt   Activity Tolerance Patient tolerated treatment well   Behavior During Therapy [facility] for tasks assessed/performed      Past Medical History  Diagnosis Date  . Hypothyroidism   . Hyperlipidemia   . Angina at rest, with the tachycardia 07/05/2011  . Aortic insufficiency 07/05/2011  . Constipation 12/27/2011  . Atrial fibrillation ([place])   . Brady-tachy syndrome ([place])     Dual chamber Medtronic pacemaker Adapta  . Pacemaker   . OA (osteoarthritis)   . Chronic back pain   . Gait instability   . Tremor   . Parkinson's disease ([place])   . Normal cardiac stress test 2013    Past Surgical History  Procedure Laterality Date  . Permanent pacemaker  insertion  07/05/2011    Medtronic Adapta dual chamber  . Cardiac catheterization  05/26/1999    normal  . Nm myoview ltd  07/19/2011    normal  . Colonoscopy N/A 11/15/2012    Procedure: COLONOSCOPY;  Surgeon: [name], MD;  [facility];  Service: Endoscopy;  Laterality: N/A;  1030  . Permanent pacemaker insertion N/A 07/05/2011    Procedure: PERMANENT PACEMAKER INSERTION;  Surgeon: [name], MD;  Location: [facility] CATH LAB;  Service: Cardiovascular;  Laterality: N/A;  . Insert / replace / remove pacemaker    . Abdominal hysterectomy    . Cardiac catheterization  2001    normal coronary arteries    There were no vitals filed for this visit.  Visit Diagnosis:  Left-sided low back pain with left-sided sciatica  Pain in joint, pelvic region and thigh, left  Spasm of back muscles  Decreased back mobility  Unsteadiness on feet  Hypophonia  Difficulty navigating stairs  Tremor      Subjective Assessment - 01/29/15 1213    Subjective PT state she is feeling good today.  Currently without sciatica, only with soreness over  Lt SI region.  PT states she is having most difficulty wtih her balance.   Currently in Pain? No/denies                         [facility] Adult PT Treatment/Exercise - 01/29/15 0001    Ambulation/Gait   Ambulation/Gait Yes   Gait Comments 6 minutes (completing 1425 feet) VC for step length, arm swing, navigating obstacles.    Lumbar Exercises: Stretches   Active Hamstring Stretch 3 reps;30 seconds   Active Hamstring Stretch Limitations Standing on 12" block   Passive Hamstring Stretch 30 seconds;3 reps   Lumbar Exercises: Standing   Scapular Retraction 10 reps   Theraband Level (Scapular Retraction) Level 2 (Red)   Row 10 reps   Theraband Level (Row) Level 2 (Red)   Shoulder Extension 10 reps   Theraband Level (Shoulder Extension) Level 2 (Red)             Balance Exercises - 01/29/15 1214    Balance Exercises:  Standing   SLS with Vectors 5 reps   Tandem Gait 2 reps   Retro Gait 2 reps   Sidestepping 2 reps  red theraband             PT Short Term Goals - 01/20/15 0913    PT SHORT TERM GOAL #1   Title By week two patient will demonstrate indep in an entry level HEP performing at least 4-6 days weeklty.    Status Achieved   PT SHORT TERM GOAL #2   Title By week three patient will ambulate 435fet in 2 minutes to demonstrate safe limited community distances.    Baseline Pt ambulating 4850fin 2 minutes on 12/27   Status Achieved   PT SHORT TERM GOAL #3   Title By week 3, patient will demonstrate the ability to perform standing at home for 1 hour or greater free of pain or symptoms to improve ability to performing housework and  kitchen activities.   Baseline Reduced from immediate pain upon standing to tolerance of 30-45 minutes without exacerbation.    Status On-going           PT Long Term Goals - 01/20/15 0929    PT LONG TERM GOAL #1   Title By the end of week 5, pt will demonstrate independence in an advanced HEP to continue progress after DC from therapy.    Status On-going   PT LONG TERM GOAL #2   Title By the end of 5 weeks, patient will tolerate a six miute walk test, covering 140068fith less than 3/10 pain to demonstrate ability to safely perform  IADL.    Status On-going   PT LONG TERM GOAL #3   Title By week five pt will improve functional stregth as evidenced by 5x sit to stand in les than 8 seconds.    Status On-going               Plan - 01/29/15 1219    Clinical Impression Statement Focused mainly on dynamic balance today as she has more difficulty with this. Pt wtihout LOB, however tendency to look down with progressively rounded posturing without cues.  Pt Also requires cues to increase arm swing and step length with ambualtion.  Postural 3 actvity with theraband added to improve postural muscle strength.     Pt will benefit from skilled therapeutic  intervention in order to improve on the following deficits Abnormal gait;Decreased activity tolerance;Decreased  range of motion;Decreased mobility;Difficulty walking;Impaired flexibility;Improper body mechanics;Postural dysfunction;Pain   Rehab Potential Good   Clinical Impairments Affecting Rehab Potential chronicity of current condition.    PT Frequency 2x / week   PT Treatment/Interventions Moist Heat;Therapeutic exercise;Therapeutic activities;Gait training;Functional mobility training;Patient/family education;Manual techniques   PT Next Visit Plan Re-assess next session.   Consulted and Agree with Plan of Care Patient        Problem List Patient Active Problem List   Diagnosis Date Noted  . Persistent atrial fibrillation with rapid ventricular response ([place]) 01/16/2015  . Hypotension arterial 01/16/2015  . Normal coronary arteries 2001 05/08/2014  . Atrial fibrillation with RVR ([place]) 05/07/2014  . Pacemaker 08/10/2012  . Aortic insufficiency 08/10/2012  . Constipation 12/27/2011  . Family hx of colon cancer 12/27/2011  . Elevated troponin, trace positive secondary to rapid AF 07/06/2011  . Hypothyroidism 07/05/2011  . Paroxysmal atrial fibrillation ([place]) 07/05/2011  . Sinus pause, 7 seconds post conversion, S/P MDT pacemaker 07/05/11 07/05/2011  . Hypercholesterolemia 07/05/2011  . Angina at rest, with rapid AF 07/05/2011    Teena Irani, PTA/CLT [phone]  01/29/2015, 12:24 PM  [facility] [address] [place], [place], [address] Phone: [phone]   Fax:  [phone]  Name: Abigail Taylor MRN: [id number] Date of Birth: December 13, 1931

## 2015-02-02 ENCOUNTER — Ambulatory Visit (HOSPITAL_COMMUNITY): Payer: Medicare Other | Admitting: Physical Therapy

## 2015-03-03 DIAGNOSIS — I739 Peripheral vascular disease, unspecified: Secondary | ICD-10-CM | POA: Diagnosis not present

## 2015-03-25 DIAGNOSIS — M9903 Segmental and somatic dysfunction of lumbar region: Secondary | ICD-10-CM | POA: Diagnosis not present

## 2015-03-25 DIAGNOSIS — M9902 Segmental and somatic dysfunction of thoracic region: Secondary | ICD-10-CM | POA: Diagnosis not present

## 2015-03-25 DIAGNOSIS — M9905 Segmental and somatic dysfunction of pelvic region: Secondary | ICD-10-CM | POA: Diagnosis not present

## 2015-03-25 DIAGNOSIS — H02105 Unspecified ectropion of left lower eyelid: Secondary | ICD-10-CM | POA: Diagnosis not present

## 2015-03-25 DIAGNOSIS — M4806 Spinal stenosis, lumbar region: Secondary | ICD-10-CM | POA: Diagnosis not present

## 2015-03-27 DIAGNOSIS — M4806 Spinal stenosis, lumbar region: Secondary | ICD-10-CM | POA: Diagnosis not present

## 2015-03-27 DIAGNOSIS — M9902 Segmental and somatic dysfunction of thoracic region: Secondary | ICD-10-CM | POA: Diagnosis not present

## 2015-03-27 DIAGNOSIS — M9903 Segmental and somatic dysfunction of lumbar region: Secondary | ICD-10-CM | POA: Diagnosis not present

## 2015-03-27 DIAGNOSIS — M9905 Segmental and somatic dysfunction of pelvic region: Secondary | ICD-10-CM | POA: Diagnosis not present

## 2015-03-31 DIAGNOSIS — M9905 Segmental and somatic dysfunction of pelvic region: Secondary | ICD-10-CM | POA: Diagnosis not present

## 2015-03-31 DIAGNOSIS — M4806 Spinal stenosis, lumbar region: Secondary | ICD-10-CM | POA: Diagnosis not present

## 2015-03-31 DIAGNOSIS — M9902 Segmental and somatic dysfunction of thoracic region: Secondary | ICD-10-CM | POA: Diagnosis not present

## 2015-03-31 DIAGNOSIS — M9903 Segmental and somatic dysfunction of lumbar region: Secondary | ICD-10-CM | POA: Diagnosis not present

## 2015-04-02 ENCOUNTER — Encounter (INDEPENDENT_AMBULATORY_CARE_PROVIDER_SITE_OTHER): Payer: Self-pay | Admitting: Internal Medicine

## 2015-04-03 DIAGNOSIS — M9903 Segmental and somatic dysfunction of lumbar region: Secondary | ICD-10-CM | POA: Diagnosis not present

## 2015-04-03 DIAGNOSIS — M9902 Segmental and somatic dysfunction of thoracic region: Secondary | ICD-10-CM | POA: Diagnosis not present

## 2015-04-03 DIAGNOSIS — M4806 Spinal stenosis, lumbar region: Secondary | ICD-10-CM | POA: Diagnosis not present

## 2015-04-03 DIAGNOSIS — M9905 Segmental and somatic dysfunction of pelvic region: Secondary | ICD-10-CM | POA: Diagnosis not present

## 2015-04-06 DIAGNOSIS — M9905 Segmental and somatic dysfunction of pelvic region: Secondary | ICD-10-CM | POA: Diagnosis not present

## 2015-04-06 DIAGNOSIS — M9902 Segmental and somatic dysfunction of thoracic region: Secondary | ICD-10-CM | POA: Diagnosis not present

## 2015-04-06 DIAGNOSIS — M9903 Segmental and somatic dysfunction of lumbar region: Secondary | ICD-10-CM | POA: Diagnosis not present

## 2015-04-06 DIAGNOSIS — M4806 Spinal stenosis, lumbar region: Secondary | ICD-10-CM | POA: Diagnosis not present

## 2015-04-08 DIAGNOSIS — M4806 Spinal stenosis, lumbar region: Secondary | ICD-10-CM | POA: Diagnosis not present

## 2015-04-08 DIAGNOSIS — M9902 Segmental and somatic dysfunction of thoracic region: Secondary | ICD-10-CM | POA: Diagnosis not present

## 2015-04-08 DIAGNOSIS — M9903 Segmental and somatic dysfunction of lumbar region: Secondary | ICD-10-CM | POA: Diagnosis not present

## 2015-04-08 DIAGNOSIS — M9905 Segmental and somatic dysfunction of pelvic region: Secondary | ICD-10-CM | POA: Diagnosis not present

## 2015-04-10 ENCOUNTER — Telehealth: Payer: Self-pay | Admitting: Cardiology

## 2015-04-10 ENCOUNTER — Ambulatory Visit (INDEPENDENT_AMBULATORY_CARE_PROVIDER_SITE_OTHER): Payer: Medicare Other | Admitting: *Deleted

## 2015-04-10 DIAGNOSIS — I495 Sick sinus syndrome: Secondary | ICD-10-CM | POA: Diagnosis not present

## 2015-04-10 DIAGNOSIS — M9905 Segmental and somatic dysfunction of pelvic region: Secondary | ICD-10-CM | POA: Diagnosis not present

## 2015-04-10 DIAGNOSIS — M9902 Segmental and somatic dysfunction of thoracic region: Secondary | ICD-10-CM | POA: Diagnosis not present

## 2015-04-10 DIAGNOSIS — M4806 Spinal stenosis, lumbar region: Secondary | ICD-10-CM | POA: Diagnosis not present

## 2015-04-10 DIAGNOSIS — M9903 Segmental and somatic dysfunction of lumbar region: Secondary | ICD-10-CM | POA: Diagnosis not present

## 2015-04-10 NOTE — Telephone Encounter (Signed)
LMOVM reminding pt to send remote transmission.   

## 2015-04-10 NOTE — Progress Notes (Signed)
Remote pacemaker transmission.   

## 2015-04-13 DIAGNOSIS — M4806 Spinal stenosis, lumbar region: Secondary | ICD-10-CM | POA: Diagnosis not present

## 2015-04-13 DIAGNOSIS — M9902 Segmental and somatic dysfunction of thoracic region: Secondary | ICD-10-CM | POA: Diagnosis not present

## 2015-04-13 DIAGNOSIS — M9903 Segmental and somatic dysfunction of lumbar region: Secondary | ICD-10-CM | POA: Diagnosis not present

## 2015-04-13 DIAGNOSIS — M9905 Segmental and somatic dysfunction of pelvic region: Secondary | ICD-10-CM | POA: Diagnosis not present

## 2015-04-15 DIAGNOSIS — M9903 Segmental and somatic dysfunction of lumbar region: Secondary | ICD-10-CM | POA: Diagnosis not present

## 2015-04-15 DIAGNOSIS — M9905 Segmental and somatic dysfunction of pelvic region: Secondary | ICD-10-CM | POA: Diagnosis not present

## 2015-04-15 DIAGNOSIS — M9902 Segmental and somatic dysfunction of thoracic region: Secondary | ICD-10-CM | POA: Diagnosis not present

## 2015-04-15 DIAGNOSIS — M4806 Spinal stenosis, lumbar region: Secondary | ICD-10-CM | POA: Diagnosis not present

## 2015-04-17 DIAGNOSIS — M9903 Segmental and somatic dysfunction of lumbar region: Secondary | ICD-10-CM | POA: Diagnosis not present

## 2015-04-17 DIAGNOSIS — M9902 Segmental and somatic dysfunction of thoracic region: Secondary | ICD-10-CM | POA: Diagnosis not present

## 2015-04-17 DIAGNOSIS — M9905 Segmental and somatic dysfunction of pelvic region: Secondary | ICD-10-CM | POA: Diagnosis not present

## 2015-04-17 DIAGNOSIS — M4806 Spinal stenosis, lumbar region: Secondary | ICD-10-CM | POA: Diagnosis not present

## 2015-04-20 DIAGNOSIS — M9902 Segmental and somatic dysfunction of thoracic region: Secondary | ICD-10-CM | POA: Diagnosis not present

## 2015-04-20 DIAGNOSIS — M9903 Segmental and somatic dysfunction of lumbar region: Secondary | ICD-10-CM | POA: Diagnosis not present

## 2015-04-20 DIAGNOSIS — M4806 Spinal stenosis, lumbar region: Secondary | ICD-10-CM | POA: Diagnosis not present

## 2015-04-20 DIAGNOSIS — M9905 Segmental and somatic dysfunction of pelvic region: Secondary | ICD-10-CM | POA: Diagnosis not present

## 2015-04-22 ENCOUNTER — Ambulatory Visit (INDEPENDENT_AMBULATORY_CARE_PROVIDER_SITE_OTHER): Payer: Medicare Other | Admitting: Cardiovascular Disease

## 2015-04-22 ENCOUNTER — Encounter: Payer: Self-pay | Admitting: Cardiovascular Disease

## 2015-04-22 VITALS — BP 145/66 | HR 65 | Ht 66.0 in | Wt 128.0 lb

## 2015-04-22 DIAGNOSIS — I4819 Other persistent atrial fibrillation: Secondary | ICD-10-CM

## 2015-04-22 DIAGNOSIS — Z950 Presence of cardiac pacemaker: Secondary | ICD-10-CM | POA: Diagnosis not present

## 2015-04-22 DIAGNOSIS — I455 Other specified heart block: Secondary | ICD-10-CM | POA: Diagnosis not present

## 2015-04-22 DIAGNOSIS — I481 Persistent atrial fibrillation: Secondary | ICD-10-CM | POA: Diagnosis not present

## 2015-04-22 DIAGNOSIS — G903 Multi-system degeneration of the autonomic nervous system: Secondary | ICD-10-CM | POA: Diagnosis not present

## 2015-04-22 DIAGNOSIS — E7800 Pure hypercholesterolemia, unspecified: Secondary | ICD-10-CM

## 2015-04-22 NOTE — Progress Notes (Signed)
Patient ID: Abigail Taylor, female   DOB: 09/28/1931, 82 y.o.   MRN: [id number]    Cardiology Office Note    Date:  04/22/2015   ID:  Abigail Taylor, DOB [birth year], MRN [id number]  PCP:  Glo Herring., MD  Cardiologist:   [name], MD   Chief Complaint  Patient presents with  . Follow-up    patient reports no complaints    History of Present Illness:  Abigail Taylor is a 82 y.o. female with a history of sinus node dysfunction, recurrent paroxysmal and persistent atrial fibrillation with rapid ventricular response, tachycardia-bradycardia syndrome, status post implantation of a dual-chamber permanent pacemaker (Medtronic Adapta). She has recently had problems with hypotension related to Parkinson's disease and treatment with levodopa. She started treatment with fludrocortisone a few months ago. She is having trouble wearing compression stockings. She is on a lower dose of diltiazem due to hypotension. She is taking anticoagulation with Xarelto without any complications. She is on long-standing treatment with statin for hyperlipidemia. She had normal coronary arteries by remote angiogram performed in 2001 and a normal nuclear stress test in 2013. The most recent clinically evident episode of atrial fibrillation led to an emergency room evaluation on 01/06/2015.  Pacemaker check shows normal function, battery longevity 11.5 years, 50% A pacing, 0.1% V pacing. There is only 0.6% AFib mode switch (approximately 13 hours on Feb 23)  control during AF was fair (mean rate 101 bpm). There are a handful of episodes of brief PAT, up to 5" duration.  Past Medical History  Diagnosis Date  . Hypothyroidism   . Hyperlipidemia   . Angina at rest, with the tachycardia 07/05/2011  . Aortic insufficiency 07/05/2011  . Constipation 12/27/2011  . Atrial fibrillation ([place])   . Brady-tachy syndrome ([place])     Dual chamber Medtronic pacemaker Adapta  . Pacemaker   . OA (osteoarthritis)   . Chronic  back pain   . Gait instability   . Tremor   . Parkinson's disease ([place])   . Normal cardiac stress test 2013    Past Surgical History  Procedure Laterality Date  . Permanent pacemaker insertion  07/05/2011    Medtronic Adapta dual chamber  . Cardiac catheterization  05/26/1999    normal  . Nm myoview ltd  07/19/2011    normal  . Colonoscopy N/A 11/15/2012    Procedure: COLONOSCOPY;  Surgeon: [name], MD;  [facility];  Service: Endoscopy;  Laterality: N/A;  1030  . Permanent pacemaker insertion N/A 07/05/2011    Procedure: PERMANENT PACEMAKER INSERTION;  Surgeon: [name], MD;  Location: [facility] CATH LAB;  Service: Cardiovascular;  Laterality: N/A;  . Insert / replace / remove pacemaker    . Abdominal hysterectomy    . Cardiac catheterization  2001    normal coronary arteries    Current Medications: Outpatient Prescriptions Prior to Visit  Medication Sig Dispense Refill  . carbidopa-levodopa (SINEMET IR) 25-100 MG per tablet Take 1 tablet by mouth 3 (three) times daily.     Marland Kitchen diltiazem (CARDIZEM CD) 120 MG 24 hr capsule Take 1 capsule (120 mg total) by mouth daily. 90 capsule 3  . fludrocortisone (FLORINEF) 0.1 MG tablet Take 1 tablet (0.1 mg total) by mouth daily. 90 tablet 3  . levothyroxine (SYNTHROID, LEVOTHROID) 50 MCG tablet Take 50 mcg by mouth daily.    . rivaroxaban (XARELTO) 20 MG TABS tablet Take 1 tablet (20 mg total) by mouth daily with supper. 90 tablet 3  .  rosuvastatin (CRESTOR) 10 MG tablet Take 10 mg by mouth at bedtime.     No facility-administered medications prior to visit.     Allergies:   Review of patient's allergies indicates no known allergies.   Social History   Social History  . Marital Status: Married    Spouse Name: N/A  . Number of Children: N/A  . Years of Education: N/A   Social History Main Topics  . Smoking status: Never Smoker   . Smokeless tobacco: Never Used  . Alcohol Use: No  . Drug Use: No  . Sexual Activity:  Not Asked   Other Topics Concern  . None   Social History Narrative     Family History:  The patient's family history includes Cancer in her father; Colon cancer (age of onset: 110) in her mother; Heart attack in her mother.   ROS:   Please see the history of present illness.    ROS All other systems reviewed and are negative.   PHYSICAL EXAM:   VS:  BP 145/66 mmHg  Pulse 65  Ht 5\' 6"  (1.676 m)  Wt 58.06 kg (128 lb)  BMI 20.67 kg/m2   GEN: Well nourished, well developed, in no acute distress HEENT: normal Neck: no JVD, carotid bruits, or masses Cardiac: RRR with ectopic beats often a pattern of quadrigeminy; no murmurs, rubs, or gallops,no edema, healthy PM site left subclavian.  Respiratory:  clear to auscultation bilaterally, normal work of breathing GI: soft, nontender, nondistended, + BS MS: no deformity or atrophy Skin: warm and dry, no rash Neuro:  Alert and Oriented x 3, Strength and sensation are intact Psych: euthymic mood, full affect  Wt Readings from Last 3 Encounters:  04/22/15 58.06 kg (128 lb)  01/16/15 60.419 kg (133 lb 3.2 oz)  01/12/15 59.421 kg (131 lb)      Studies/Labs Reviewed:   EKG:  EKG is not ordered today.   Recent Labs: 01/06/2015: BUN 15; Creatinine, Ser 0.94; Hemoglobin 14.7; Magnesium 1.8; Platelets 246; Potassium 3.8; Sodium 143; TSH 3.215   Lipid Panel    Component Value Date/Time   CHOL 140 08/09/2012 1142   TRIG 161* 08/09/2012 1142   HDL 40 08/09/2012 1142   CHOLHDL 3.5 08/09/2012 1142   VLDL 32 08/09/2012 1142   LDLCALC 68 08/09/2012 1142     ASSESSMENT:    1. Persistent atrial fibrillation with rapid ventricular response ([place])   2. Sinus pauses/SSS   3. Pacemaker   4. Neurogenic orthostatic hypotension ([facility])   5. Hypercholesterolemia      PLAN:  In order of problems listed above:  1. Afib: Rate control medications limited by hypotension. Overall burden of arrhythmia has not yet justified use of true  antiarrhythmics like amiodarone. Tolerating Xarelto without bleeding complications. No falls. CHADSVasc 3 (age, gender) but no history of previous stroke/TIA or other embolic events. 2. SSS: Symptoms well controlled with current pacemaker settings. Activities limited primarily by back pain rather than her to cardiac. 3. PM: Normal device function. Continue remote downloads every 3 months. 4. Orthostatic hypotension: Current dose of fludrocortisone appears to be offering a reasonable balance between supine hypertension and orthostatic hypotension. No evidence of hypervolemia/heart failure. Continue current dose. 5. HLP: Order repeat labs    Medication Adjustments/Labs and Tests Ordered: Current medicines are reviewed at length with the patient today.  Concerns regarding medicines are outlined above.  Medication changes, Labs and Tests ordered today are listed in the Patient Instructions below. Patient Instructions  Dr  Alverto Shedd recommends that you continue on your current medications as directed. Please refer to the Current Medication list given to you today.  Remote monitoring is used to monitor your Pacemaker of ICD from home. This monitoring reduces the number of office visits required to check your device to one time per year. It allows [place] to keep an eye on the functioning of your device to ensure it is working properly. You are scheduled for a device check from home on July 24, 2015. You may send your transmission at any time that day. If you have a wireless device, the transmission will be sent automatically. After your physician reviews your transmission, you will receive a postcard with your next transmission date.  Dr Sallyanne Kuster recommends that you schedule a follow-up appointment in 6 months with a device check. You will receive a reminder letter in the mail two months in advance. If you don't receive a letter, please call our office to schedule the follow-up appointment.  If you need a  refill on your cardiac medications before your next appointment, please call your pharmacy.     Mikael Spray, MD  04/22/2015 1:56 PM    [facility] [place], [place], [place]  [address] Phone: [phone]; Fax: [phone]

## 2015-04-22 NOTE — Patient Instructions (Signed)
Dr Sallyanne Kuster recommends that you continue on your current medications as directed. Please refer to the Current Medication list given to you today.  Remote monitoring is used to monitor your Pacemaker of ICD from home. This monitoring reduces the number of office visits required to check your device to one time per year. It allows [place] to keep an eye on the functioning of your device to ensure it is working properly. You are scheduled for a device check from home on July 24, 2015. You may send your transmission at any time that day. If you have a wireless device, the transmission will be sent automatically. After your physician reviews your transmission, you will receive a postcard with your next transmission date.  Dr Sallyanne Kuster recommends that you schedule a follow-up appointment in 6 months with a device check. You will receive a reminder letter in the mail two months in advance. If you don't receive a letter, please call our office to schedule the follow-up appointment.  If you need a refill on your cardiac medications before your next appointment, please call your pharmacy.

## 2015-04-24 ENCOUNTER — Telehealth: Payer: Self-pay

## 2015-04-24 DIAGNOSIS — M4806 Spinal stenosis, lumbar region: Secondary | ICD-10-CM | POA: Diagnosis not present

## 2015-04-24 DIAGNOSIS — Z5181 Encounter for therapeutic drug level monitoring: Secondary | ICD-10-CM

## 2015-04-24 DIAGNOSIS — M9902 Segmental and somatic dysfunction of thoracic region: Secondary | ICD-10-CM | POA: Diagnosis not present

## 2015-04-24 DIAGNOSIS — M9903 Segmental and somatic dysfunction of lumbar region: Secondary | ICD-10-CM | POA: Diagnosis not present

## 2015-04-24 DIAGNOSIS — E785 Hyperlipidemia, unspecified: Secondary | ICD-10-CM

## 2015-04-24 DIAGNOSIS — M9905 Segmental and somatic dysfunction of pelvic region: Secondary | ICD-10-CM | POA: Diagnosis not present

## 2015-04-24 NOTE — Telephone Encounter (Signed)
Patient needs labs done, lipid and cmp.  lmtcb

## 2015-04-27 ENCOUNTER — Telehealth: Payer: Self-pay | Admitting: Cardiovascular Disease

## 2015-04-27 NOTE — Telephone Encounter (Signed)
Acknowledged. Thanks

## 2015-04-27 NOTE — Telephone Encounter (Signed)
lmtcb

## 2015-04-27 NOTE — Telephone Encounter (Signed)
Pt notified of recommendation for labwork.

## 2015-04-27 NOTE — Telephone Encounter (Signed)
New message  ° ° °Patient calling back to speak with nurse  °

## 2015-04-28 DIAGNOSIS — M4806 Spinal stenosis, lumbar region: Secondary | ICD-10-CM | POA: Diagnosis not present

## 2015-04-28 DIAGNOSIS — M9902 Segmental and somatic dysfunction of thoracic region: Secondary | ICD-10-CM | POA: Diagnosis not present

## 2015-04-28 DIAGNOSIS — M9905 Segmental and somatic dysfunction of pelvic region: Secondary | ICD-10-CM | POA: Diagnosis not present

## 2015-04-28 DIAGNOSIS — M9903 Segmental and somatic dysfunction of lumbar region: Secondary | ICD-10-CM | POA: Diagnosis not present

## 2015-04-30 DIAGNOSIS — E785 Hyperlipidemia, unspecified: Secondary | ICD-10-CM | POA: Diagnosis not present

## 2015-04-30 DIAGNOSIS — Z5181 Encounter for therapeutic drug level monitoring: Secondary | ICD-10-CM | POA: Diagnosis not present

## 2015-04-30 LAB — CUP PACEART REMOTE DEVICE CHECK
Battery Impedance: 207 Ohm
Battery Remaining Longevity: 140 mo
Battery Voltage: 2.79 V
Brady Statistic AP VP Percent: 0 %
Brady Statistic AS VP Percent: 0 %
Date Time Interrogation Session: 20170317155757
Implantable Lead Implant Date: 20130611
Implantable Lead Location: 753859
Implantable Lead Location: 753860
Implantable Lead Model: 5076
Lead Channel Impedance Value: 608 Ohm
Lead Channel Pacing Threshold Amplitude: 1 V
Lead Channel Sensing Intrinsic Amplitude: 2.8 mV
Lead Channel Setting Pacing Amplitude: 1.5 V
Lead Channel Setting Pacing Pulse Width: 0.4 ms
MDC IDC LEAD IMPLANT DT: 20130611
MDC IDC MSMT LEADCHNL RA IMPEDANCE VALUE: 448 Ohm
MDC IDC MSMT LEADCHNL RA PACING THRESHOLD AMPLITUDE: 0.625 V
MDC IDC MSMT LEADCHNL RA PACING THRESHOLD PULSEWIDTH: 0.4 ms
MDC IDC MSMT LEADCHNL RV PACING THRESHOLD PULSEWIDTH: 0.4 ms
MDC IDC MSMT LEADCHNL RV SENSING INTR AMPL: 11.2 mV
MDC IDC SET LEADCHNL RV PACING AMPLITUDE: 2 V
MDC IDC SET LEADCHNL RV SENSING SENSITIVITY: 5.6 mV
MDC IDC STAT BRADY AP VS PERCENT: 50 %
MDC IDC STAT BRADY AS VS PERCENT: 50 %

## 2015-04-30 NOTE — Telephone Encounter (Signed)
Patient notified.  See telephone note from 04/27/2015.

## 2015-05-01 LAB — COMPREHENSIVE METABOLIC PANEL
ALT: 8 U/L (ref 6–29)
AST: 13 U/L (ref 10–35)
Albumin: 4.2 g/dL (ref 3.6–5.1)
Alkaline Phosphatase: 71 U/L (ref 33–130)
BUN: 12 mg/dL (ref 7–25)
CALCIUM: 8.9 mg/dL (ref 8.6–10.4)
CHLORIDE: 105 mmol/L (ref 98–110)
CO2: 28 mmol/L (ref 20–31)
Creat: 0.74 mg/dL (ref 0.60–0.88)
GLUCOSE: 88 mg/dL (ref 65–99)
POTASSIUM: 3.5 mmol/L (ref 3.5–5.3)
Sodium: 142 mmol/L (ref 135–146)
Total Bilirubin: 0.7 mg/dL (ref 0.2–1.2)
Total Protein: 6.1 g/dL (ref 6.1–8.1)

## 2015-05-01 LAB — LIPID PANEL
CHOL/HDL RATIO: 2.5 ratio (ref <=5.0)
CHOLESTEROL: 133 mg/dL (ref 125–200)
HDL: 54 mg/dL (ref >=46)
LDL CALC: 62 mg/dL (ref <130)
TRIGLYCERIDES: 84 mg/dL (ref <150)
VLDL: 17 mg/dL (ref <30)

## 2015-05-05 ENCOUNTER — Encounter: Payer: Self-pay | Admitting: Cardiology

## 2015-05-11 DIAGNOSIS — G20 Parkinson's disease: Secondary | ICD-10-CM | POA: Diagnosis not present

## 2015-05-11 DIAGNOSIS — I951 Orthostatic hypotension: Secondary | ICD-10-CM | POA: Diagnosis not present

## 2015-05-11 DIAGNOSIS — G3184 Mild cognitive impairment, so stated: Secondary | ICD-10-CM | POA: Diagnosis not present

## 2015-05-11 DIAGNOSIS — I4891 Unspecified atrial fibrillation: Secondary | ICD-10-CM | POA: Diagnosis not present

## 2015-05-11 DIAGNOSIS — Z79899 Other long term (current) drug therapy: Secondary | ICD-10-CM | POA: Diagnosis not present

## 2015-05-11 DIAGNOSIS — M545 Low back pain: Secondary | ICD-10-CM | POA: Diagnosis not present

## 2015-05-11 DIAGNOSIS — I10 Essential (primary) hypertension: Secondary | ICD-10-CM | POA: Diagnosis not present

## 2015-05-12 DIAGNOSIS — I739 Peripheral vascular disease, unspecified: Secondary | ICD-10-CM | POA: Diagnosis not present

## 2015-06-02 DIAGNOSIS — M9902 Segmental and somatic dysfunction of thoracic region: Secondary | ICD-10-CM | POA: Diagnosis not present

## 2015-06-02 DIAGNOSIS — M4806 Spinal stenosis, lumbar region: Secondary | ICD-10-CM | POA: Diagnosis not present

## 2015-06-02 DIAGNOSIS — M9905 Segmental and somatic dysfunction of pelvic region: Secondary | ICD-10-CM | POA: Diagnosis not present

## 2015-06-02 DIAGNOSIS — M9903 Segmental and somatic dysfunction of lumbar region: Secondary | ICD-10-CM | POA: Diagnosis not present

## 2015-06-11 DIAGNOSIS — Z1389 Encounter for screening for other disorder: Secondary | ICD-10-CM | POA: Diagnosis not present

## 2015-06-11 DIAGNOSIS — Z0000 Encounter for general adult medical examination without abnormal findings: Secondary | ICD-10-CM | POA: Diagnosis not present

## 2015-06-16 DIAGNOSIS — M4806 Spinal stenosis, lumbar region: Secondary | ICD-10-CM | POA: Diagnosis not present

## 2015-06-16 DIAGNOSIS — M9905 Segmental and somatic dysfunction of pelvic region: Secondary | ICD-10-CM | POA: Diagnosis not present

## 2015-06-16 DIAGNOSIS — M9903 Segmental and somatic dysfunction of lumbar region: Secondary | ICD-10-CM | POA: Diagnosis not present

## 2015-06-16 DIAGNOSIS — M9902 Segmental and somatic dysfunction of thoracic region: Secondary | ICD-10-CM | POA: Diagnosis not present

## 2015-07-02 DIAGNOSIS — Z1389 Encounter for screening for other disorder: Secondary | ICD-10-CM | POA: Diagnosis not present

## 2015-07-02 DIAGNOSIS — I10 Essential (primary) hypertension: Secondary | ICD-10-CM | POA: Diagnosis not present

## 2015-07-02 DIAGNOSIS — G20 Parkinson's disease: Secondary | ICD-10-CM | POA: Diagnosis not present

## 2015-07-02 DIAGNOSIS — E063 Autoimmune thyroiditis: Secondary | ICD-10-CM | POA: Diagnosis not present

## 2015-07-02 DIAGNOSIS — Z6821 Body mass index (BMI) 21.0-21.9, adult: Secondary | ICD-10-CM | POA: Diagnosis not present

## 2015-07-02 DIAGNOSIS — E782 Mixed hyperlipidemia: Secondary | ICD-10-CM | POA: Diagnosis not present

## 2015-07-09 ENCOUNTER — Ambulatory Visit (INDEPENDENT_AMBULATORY_CARE_PROVIDER_SITE_OTHER): Payer: Medicare Other | Admitting: Internal Medicine

## 2015-07-09 ENCOUNTER — Encounter (INDEPENDENT_AMBULATORY_CARE_PROVIDER_SITE_OTHER): Payer: Self-pay | Admitting: Internal Medicine

## 2015-07-09 VITALS — BP 148/62 | HR 64 | Temp 97.6°F | Ht 66.5 in | Wt 129.5 lb

## 2015-07-09 DIAGNOSIS — K5909 Other constipation: Secondary | ICD-10-CM

## 2015-07-09 NOTE — Progress Notes (Signed)
Subjective:    Patient ID: Abigail Taylor, female    DOB: [birth year], 82 y.o.   MRN: [id number]  HPI  Here today for f/u. Last seen in June of 2016 by me for constipation.  She uses glycerin supp daily. She has a BM daily. No melena or BRRB. Her appetite is good. She has lost weight due to having bronchitis. Her last weight in June of 2016 was 144. Now her weight is 129.5. She is trying to exercise by walking. She does stretching exercises.  She eats 3 meals a day.      Marland Kitchen Hx of atrial fib. Presently taking Xarelto  for her atrial fib.     Family hx of colon cancer in a mother age 54 11/15/2012 Colonoscopy: Family hx of colon cancer (mother) Impression:  Examination performed to cecum.  Moderate sigmoid colon diverticulosis.  External hemorrhoids and sentinel skin tags.    11/15/2012 Colonoscopy  Indications: Patient is 82 year old Caucasian female who is here dramatically for screening colonoscopy because of family history of colon carcinoma in her mother. She does complain of hypogastric discomfort at the time of defecation or when she empties her bladder. Examination performed to cecum. Moderate sigmoid colon diverticulosis. External hemorrhoids and sentinel skin tags.  Review of Systems Past Medical History  Diagnosis Date  . Hypothyroidism   . Hyperlipidemia   . Angina at rest, with the tachycardia 07/05/2011  . Aortic insufficiency 07/05/2011  . Constipation 12/27/2011  . Atrial fibrillation ([place])   . Brady-tachy syndrome ([place])     Dual chamber Medtronic pacemaker Adapta  . Pacemaker   . OA (osteoarthritis)   . Chronic back pain   . Gait instability   . Tremor   . Parkinson's disease ([place])   . Normal cardiac stress test 2013    Past Surgical History  Procedure Laterality Date  . Permanent pacemaker insertion  07/05/2011    Medtronic Adapta dual chamber  . Cardiac catheterization  05/26/1999    normal  . Nm myoview ltd  07/19/2011    normal  .  Colonoscopy N/A 11/15/2012    Procedure: COLONOSCOPY;  Surgeon: [name], MD;  [facility];  Service: Endoscopy;  Laterality: N/A;  1030  . Permanent pacemaker insertion N/A 07/05/2011    Procedure: PERMANENT PACEMAKER INSERTION;  Surgeon: [name], MD;  Location: [facility] CATH LAB;  Service: Cardiovascular;  Laterality: N/A;  . Insert / replace / remove pacemaker    . Abdominal hysterectomy    . Cardiac catheterization  2001    normal coronary arteries    No Known Allergies  Current Outpatient Prescriptions on File Prior to Visit  Medication Sig Dispense Refill  . carbidopa-levodopa (SINEMET IR) 25-100 MG per tablet Take 1 tablet by mouth 3 (three) times daily.     Marland Kitchen diltiazem (CARDIZEM CD) 120 MG 24 hr capsule Take 1 capsule (120 mg total) by mouth daily. 90 capsule 3  . fludrocortisone (FLORINEF) 0.1 MG tablet Take 1 tablet (0.1 mg total) by mouth daily. 90 tablet 3  . levothyroxine (SYNTHROID, LEVOTHROID) 50 MCG tablet Take 50 mcg by mouth daily.    . rivaroxaban (XARELTO) 20 MG TABS tablet Take 1 tablet (20 mg total) by mouth daily with supper. 90 tablet 3  . rosuvastatin (CRESTOR) 10 MG tablet Take 10 mg by mouth at bedtime. Reported on 07/09/2015     No current facility-administered medications on file prior to visit.        Objective:  Physical Exam Blood pressure 148/62, pulse 64, temperature 97.6 F (36.4 C), height 5' 6.5" (1.689 m), weight 129 lb 8 oz (58.741 kg). Alert and oriented. Skin warm and dry. Oral mucosa is moist.   . Sclera anicteric, conjunctivae is pink. Thyroid not enlarged. No cervical lymphadenopathy. Lungs clear. Heart regular rate and rhythm.  Abdomen is soft. Bowel sounds are positive. No hepatomegaly. No abdominal masses felt. No tenderness.  No edema to lower extremities.         Assessment & Plan:  Constipation. Continue to glycerin as needed. OV in 1 year.

## 2015-07-09 NOTE — Patient Instructions (Signed)
Continue the glycerin supp as needed. OV in one year.

## 2015-07-10 ENCOUNTER — Telehealth: Payer: Self-pay | Admitting: Cardiology

## 2015-07-10 ENCOUNTER — Ambulatory Visit (INDEPENDENT_AMBULATORY_CARE_PROVIDER_SITE_OTHER): Payer: Medicare Other | Admitting: *Deleted

## 2015-07-10 DIAGNOSIS — I455 Other specified heart block: Secondary | ICD-10-CM | POA: Diagnosis not present

## 2015-07-10 NOTE — Telephone Encounter (Signed)
Spoke with pt and reminded pt of remote transmission that is due today. Pt verbalized understanding.   

## 2015-07-10 NOTE — Progress Notes (Signed)
Remote pacemaker transmission.   

## 2015-07-14 LAB — CUP PACEART REMOTE DEVICE CHECK
Battery Remaining Longevity: 136 mo
Implantable Lead Implant Date: 20130611
Implantable Lead Location: 753859
Lead Channel Impedance Value: 467 Ohm
Lead Channel Pacing Threshold Amplitude: 0.625 V
Lead Channel Pacing Threshold Amplitude: 0.875 V
Lead Channel Pacing Threshold Pulse Width: 0.4 ms
Lead Channel Setting Pacing Amplitude: 1.5 V
Lead Channel Setting Pacing Amplitude: 2 V
Lead Channel Setting Pacing Pulse Width: 0.4 ms
Lead Channel Setting Sensing Sensitivity: 5.6 mV
MDC IDC LEAD IMPLANT DT: 20130611
MDC IDC LEAD LOCATION: 753860
MDC IDC MSMT BATTERY IMPEDANCE: 231 Ohm
MDC IDC MSMT BATTERY VOLTAGE: 2.79 V
MDC IDC MSMT LEADCHNL RA PACING THRESHOLD PULSEWIDTH: 0.4 ms
MDC IDC MSMT LEADCHNL RA SENSING INTR AMPL: 2.8 mV
MDC IDC MSMT LEADCHNL RV IMPEDANCE VALUE: 594 Ohm
MDC IDC MSMT LEADCHNL RV SENSING INTR AMPL: 11.2 mV
MDC IDC SESS DTM: 20170616134156
MDC IDC STAT BRADY AP VP PERCENT: 0 %
MDC IDC STAT BRADY AP VS PERCENT: 50 %
MDC IDC STAT BRADY AS VP PERCENT: 0 %
MDC IDC STAT BRADY AS VS PERCENT: 50 %

## 2015-07-15 ENCOUNTER — Encounter: Payer: Self-pay | Admitting: Cardiology

## 2015-07-17 DIAGNOSIS — J329 Chronic sinusitis, unspecified: Secondary | ICD-10-CM | POA: Diagnosis not present

## 2015-07-17 DIAGNOSIS — J069 Acute upper respiratory infection, unspecified: Secondary | ICD-10-CM | POA: Diagnosis not present

## 2015-07-17 DIAGNOSIS — Z6821 Body mass index (BMI) 21.0-21.9, adult: Secondary | ICD-10-CM | POA: Diagnosis not present

## 2015-07-17 DIAGNOSIS — J309 Allergic rhinitis, unspecified: Secondary | ICD-10-CM | POA: Diagnosis not present

## 2015-07-17 DIAGNOSIS — J343 Hypertrophy of nasal turbinates: Secondary | ICD-10-CM | POA: Diagnosis not present

## 2015-07-17 DIAGNOSIS — R0602 Shortness of breath: Secondary | ICD-10-CM | POA: Diagnosis not present

## 2015-07-21 DIAGNOSIS — I739 Peripheral vascular disease, unspecified: Secondary | ICD-10-CM | POA: Diagnosis not present

## 2015-09-02 ENCOUNTER — Ambulatory Visit (INDEPENDENT_AMBULATORY_CARE_PROVIDER_SITE_OTHER): Payer: Medicare Other | Admitting: Cardiovascular Disease

## 2015-09-02 ENCOUNTER — Encounter (INDEPENDENT_AMBULATORY_CARE_PROVIDER_SITE_OTHER): Payer: Self-pay

## 2015-09-02 ENCOUNTER — Encounter: Payer: Self-pay | Admitting: Cardiovascular Disease

## 2015-09-02 VITALS — BP 100/46 | HR 73 | Ht 66.0 in | Wt 124.0 lb

## 2015-09-02 DIAGNOSIS — I480 Paroxysmal atrial fibrillation: Secondary | ICD-10-CM

## 2015-09-02 DIAGNOSIS — E7800 Pure hypercholesterolemia, unspecified: Secondary | ICD-10-CM

## 2015-09-02 DIAGNOSIS — I455 Other specified heart block: Secondary | ICD-10-CM | POA: Diagnosis not present

## 2015-09-02 DIAGNOSIS — G903 Multi-system degeneration of the autonomic nervous system: Secondary | ICD-10-CM

## 2015-09-02 DIAGNOSIS — Z950 Presence of cardiac pacemaker: Secondary | ICD-10-CM

## 2015-09-02 MED ORDER — DILTIAZEM HCL 30 MG PO TABS: 30.0000 mg | ORAL_TABLET | Freq: Every day | ORAL | 2 refills | Status: DC | PRN

## 2015-09-02 NOTE — Patient Instructions (Signed)
Dr Sallyanne Kuster has recommended making the following medication changes: 1. STOP Diltiazem 120 mg 2. TAKE Diltiazem 30 mg - take 1 tablet by mouth as needed for rapid palpitations  Your physician has requested that you regularly monitor and record your blood pressure readings at home. Please use the same machine at the same time of day to check your readings and record them to bring to your follow-up visit. Call the office in 1 week with your readings and speak with a nurse.  Remote monitoring is used to monitor your Pacemaker of ICD from home. This monitoring reduces the number of office visits required to check your device to one time per year. It allows [place] to keep an eye on the functioning of your device to ensure it is working properly. You are scheduled for a device check from home on Wednesday, November 8th, 2017. You may send your transmission at any time that day. If you have a wireless device, the transmission will be sent automatically. After your physician reviews your transmission, you will receive a postcard with your next transmission date.  Dr Sallyanne Kuster recommends that you schedule a follow-up appointment in 6 months with a pacemaker check. You will receive a reminder letter in the mail two months in advance. If you don't receive a letter, please call our office to schedule the follow-up appointment.  If you need a refill on your cardiac medications before your next appointment, please call your pharmacy.

## 2015-09-02 NOTE — Progress Notes (Signed)
Patient ID: Abigail Taylor, female   DOB: Aug 09, 1931, 82 y.o.   MRN: [id number]    Cardiology Office Note    Date:  09/03/2015   ID:  Abigail Taylor, DOB 1932-01-05, MRN [id number]  PCP:  Glo Herring., MD  Cardiologist:   [name], MD   Chief Complaint  Patient presents with  . Follow-up    1 YEAR. Feels like she is going to past out if she stands up for a while.    History of Present Illness:  Abigail Taylor is a 82 y.o. female with a history of sinus node dysfunction, recurrent paroxysmal and persistent atrial fibrillation with rapid ventricular response, tachycardia-bradycardia syndrome, status post implantation of a dual-chamber permanent pacemaker (Medtronic Adapta).   Continues to be troubled by weakness and near-syncope if she stands up too long. She has immediate relief after lying down. She has not had rapid palpitations. Her most recent pacemaker download in June showed a burden of atrial fibrillation of only 0.5% with consistently acceptable ventricular rate control.  She denies angina, dyspnea, leg edema, orthopnea, PND, full blown syncope. Complains of poor appetite and a weak voice.  She is taking anticoagulation with Xarelto without any complications. She is on long-standing treatment with statin for hyperlipidemia. She had normal coronary arteries by remote angiogram performed in 2001 and a normal nuclear stress test in 2013.    Past Medical History:  Diagnosis Date  . Angina at rest, with the tachycardia 07/05/2011  . Aortic insufficiency 07/05/2011  . Atrial fibrillation ([place])   . Brady-tachy syndrome ([place])    Dual chamber Medtronic pacemaker Adapta  . Chronic back pain   . Constipation 12/27/2011  . Gait instability   . Hyperlipidemia   . Hypothyroidism   . Normal cardiac stress test 2013  . OA (osteoarthritis)   . Pacemaker   . Parkinson's disease ([place])   . Tremor     Past Surgical History:  Procedure Laterality Date  . ABDOMINAL HYSTERECTOMY     . CARDIAC CATHETERIZATION  05/26/1999   normal  . CARDIAC CATHETERIZATION  2001   normal coronary arteries  . COLONOSCOPY N/A 11/15/2012   Procedure: COLONOSCOPY;  Surgeon: [name], MD;  [facility];  Service: Endoscopy;  Laterality: N/A;  1030  . INSERT / REPLACE / REMOVE PACEMAKER    . NM MYOVIEW LTD  07/19/2011   normal  . PERMANENT PACEMAKER INSERTION  07/05/2011   Medtronic Adapta dual chamber  . PERMANENT PACEMAKER INSERTION N/A 07/05/2011   Procedure: PERMANENT PACEMAKER INSERTION;  Surgeon: [name], MD;  Location: [facility];  Service: Cardiovascular;  Laterality: N/A;    Current Medications: Outpatient Medications Prior to Visit  Medication Sig Dispense Refill  . carbidopa-levodopa (SINEMET IR) 25-100 MG per tablet Take 1 tablet by mouth 3 (three) times daily.     . fludrocortisone (FLORINEF) 0.1 MG tablet Take 1 tablet (0.1 mg total) by mouth daily. 90 tablet 3  . levothyroxine (SYNTHROID, LEVOTHROID) 50 MCG tablet Take 50 mcg by mouth daily.    . rivaroxaban (XARELTO) 20 MG TABS tablet Take 1 tablet (20 mg total) by mouth daily with supper. 90 tablet 3  . rosuvastatin (CRESTOR) 10 MG tablet Take 10 mg by mouth at bedtime. Reported on 07/09/2015    . diltiazem (CARDIZEM CD) 120 MG 24 hr capsule Take 1 capsule (120 mg total) by mouth daily. 90 capsule 3   No facility-administered medications prior to visit.  Allergies:   Review of patient's allergies indicates no known allergies.   Social History   Social History  . Marital status: Married    Spouse name: N/A  . Number of children: N/A  . Years of education: N/A   Social History Main Topics  . Smoking status: Never Smoker  . Smokeless tobacco: Never Used  . Alcohol use No  . Drug use: No  . Sexual activity: Not Asked   Other Topics Concern  . None   Social History Narrative  . None     Family History:  The patient's family history includes Cancer in her father; Colon cancer  (age of onset: 57) in her mother; Heart attack in her mother.   ROS:   Please see the history of present illness.    ROS All other systems reviewed and are negative.   PHYSICAL EXAM:   VS:  BP (!) 100/46   Pulse 73   Ht 5\' 6"  (1.676 m)   Wt 124 lb (56.2 kg)   BMI 20.01 kg/m    GEN: Well nourished, well developed, in no acute distress  HEENT: normal  Neck: no JVD, carotid bruits, or masses Cardiac: RRR with ectopic beats often a pattern of quadrigeminy; no murmurs, rubs, or gallops,no edema, healthy PM site left subclavian.  Respiratory:  clear to auscultation bilaterally, normal work of breathing GI: soft, nontender, nondistended, + BS MS: no deformity or atrophy  Skin: warm and dry, no rash Neuro:  Alert and Oriented x 3, Strength and sensation are intact Psych: euthymic mood, full affect  Wt Readings from Last 3 Encounters:  09/02/15 124 lb (56.2 kg)  07/09/15 129 lb 8 oz (58.7 kg)  04/22/15 128 lb (58.1 kg)      Studies/Labs Reviewed:   EKG:  EKG is not ordered today.   Recent Labs: 01/06/2015: Hemoglobin 14.7; Magnesium 1.8; Platelets 246; TSH 3.215 04/30/2015: ALT 8; BUN 12; Creat 0.74; Potassium 3.5; Sodium 142   Lipid Panel    Component Value Date/Time   CHOL 133 04/30/2015 0853   TRIG 84 04/30/2015 0853   HDL 54 04/30/2015 0853   CHOLHDL 2.5 04/30/2015 0853   VLDL 17 04/30/2015 0853   LDLCALC 62 04/30/2015 0853     ASSESSMENT:    1. Paroxysmal atrial fibrillation (HCC)   2. Sinus pause, 7 seconds post conversion, S/P MDT pacemaker 07/05/11   3. Pacemaker   4. Neurogenic orthostatic hypotension (HCC)   5. Hypercholesterolemia      PLAN:  In order of problems listed above:  1. Afib: Rate control medications limited by hypotension. Will stop diltiazem as a scheduled medication, and give her a supply of immediate release diltiazem 30 mg a take only when she has rapid palpitations. Overall burden of arrhythmia has not yet justified use of true  antiarrhythmics like amiodarone. Tolerating Xarelto without bleeding complications. No falls. CHADSVasc 3 (age, gender) but no history of previous stroke/TIA or other embolic events. 2. SSS: Symptoms well controlled with current pacemaker settings. Activities limited primarily by back pain rather than her to cardiac. 3. PM: Normal device function. Continue remote downloads every 3 months. 4. Orthostatic hypotension: Need to keep the balance between supine hypertension and orthostatic hypotension. No evidence of hypervolemia/heart failure. If she continues to have severe dizziness and weakness after stopping diltiazem, will increase to fludrocortisone to 0.2 mg daily. 5. HLP: All parameters within target range    Medication Adjustments/Labs and Tests Ordered: Current medicines are reviewed at length with  the patient today.  Concerns regarding medicines are outlined above.  Medication changes, Labs and Tests ordered today are listed in the Patient Instructions below. Patient Instructions  Dr Sallyanne Kuster has recommended making the following medication changes: 1. STOP Diltiazem 120 mg 2. TAKE Diltiazem 30 mg - take 1 tablet by mouth as needed for rapid palpitations  Your physician has requested that you regularly monitor and record your blood pressure readings at home. Please use the same machine at the same time of day to check your readings and record them to bring to your follow-up visit. Call the office in 1 week with your readings and speak with a nurse.  Remote monitoring is used to monitor your Pacemaker of ICD from home. This monitoring reduces the number of office visits required to check your device to one time per year. It allows [place] to keep an eye on the functioning of your device to ensure it is working properly. You are scheduled for a device check from home on Wednesday, November 8th, 2017. You may send your transmission at any time that day. If you have a wireless device, the transmission  will be sent automatically. After your physician reviews your transmission, you will receive a postcard with your next transmission date.  Dr Sallyanne Kuster recommends that you schedule a follow-up appointment in 6 months with a pacemaker check. You will receive a reminder letter in the mail two months in advance. If you don't receive a letter, please call our office to schedule the follow-up appointment.  If you need a refill on your cardiac medications before your next appointment, please call your pharmacy.    Signed, [name], MD  09/03/2015 5:51 PM    [facility] [address], [place], [place]  [address] Phone: [phone]; Fax: [phone]

## 2015-09-08 ENCOUNTER — Other Ambulatory Visit: Payer: Self-pay | Admitting: Cardiovascular Disease

## 2015-09-15 ENCOUNTER — Telehealth: Payer: Self-pay | Admitting: Cardiovascular Disease

## 2015-09-15 NOTE — Telephone Encounter (Signed)
Pt reports since med change on 8/9 (discontinuation of the 120mg  diltiazem), she has been feeling a lot better, not dizzy on standing, appetite improved.  She gave me readings from the past week:  117/89 HR 67 126/74 HR 73 117/61 HR 75 136/77 HR 62 147/79 HR 63  Informed patient I would send to Dr. Sallyanne Kuster and communicate any advice. She voiced understanding and thanks for assistance.

## 2015-09-15 NOTE — Telephone Encounter (Signed)
Please call,she needs to give you her weekly blood pressure reading.

## 2015-09-16 NOTE — Telephone Encounter (Signed)
Good news. Thanks. Continue this regimen.

## 2015-09-16 NOTE — Telephone Encounter (Signed)
Left msg w advice & recommended to call if questions.

## 2015-10-06 DIAGNOSIS — I739 Peripheral vascular disease, unspecified: Secondary | ICD-10-CM | POA: Diagnosis not present

## 2015-10-12 DIAGNOSIS — E782 Mixed hyperlipidemia: Secondary | ICD-10-CM | POA: Diagnosis not present

## 2015-10-12 DIAGNOSIS — E063 Autoimmune thyroiditis: Secondary | ICD-10-CM | POA: Diagnosis not present

## 2015-10-12 DIAGNOSIS — I10 Essential (primary) hypertension: Secondary | ICD-10-CM | POA: Diagnosis not present

## 2015-10-12 DIAGNOSIS — E039 Hypothyroidism, unspecified: Secondary | ICD-10-CM | POA: Diagnosis not present

## 2015-10-12 DIAGNOSIS — Z6820 Body mass index (BMI) 20.0-20.9, adult: Secondary | ICD-10-CM | POA: Diagnosis not present

## 2015-10-12 DIAGNOSIS — I4891 Unspecified atrial fibrillation: Secondary | ICD-10-CM | POA: Diagnosis not present

## 2015-10-12 DIAGNOSIS — Z1389 Encounter for screening for other disorder: Secondary | ICD-10-CM | POA: Diagnosis not present

## 2015-10-26 ENCOUNTER — Telehealth: Payer: Self-pay | Admitting: Cardiovascular Disease

## 2015-10-26 NOTE — Telephone Encounter (Signed)
Patient called regarding Xarelto and cost going up She thinks it is because she is in the donut hole with her insurance She spoke with Holland Falling and it ws recommended maybe her trying [facility] office to see if any Xarelto samples there since none here, no samples Not interested in changing to Warfarin  Left message for patient to call back

## 2015-10-26 NOTE — Telephone Encounter (Signed)
New message     Pt calling about the cost of her blood thinner Xarelto. She says it has gone up to $200 and states she can't afford it. She wants to know if she can be put on another blood thinner. Please call.    Patient calling the office for samples of medication:   1.  What medication and dosage are you requesting samples for? Xarelto 20 mg  2.  Are you currently out of this medication? No, just enough to last a week.

## 2015-10-27 ENCOUNTER — Telehealth: Payer: Self-pay

## 2015-10-27 NOTE — Telephone Encounter (Signed)
Follow up ° ° ° ° ° °Returning a call to the nurse °

## 2015-10-27 NOTE — Telephone Encounter (Signed)
Spoke to patient Xarelto 20 mg samples left at [facility] office front desk.

## 2015-10-27 NOTE — Telephone Encounter (Signed)
Returned call to patient.She stated she will keep taking Xarelto 20 mg.Stated she will be in dough nut hole until 01/2016.Advised office out of samples.She will call back the end of week for samples.

## 2015-11-05 DIAGNOSIS — Z23 Encounter for immunization: Secondary | ICD-10-CM | POA: Diagnosis not present

## 2015-11-09 DIAGNOSIS — R471 Dysarthria and anarthria: Secondary | ICD-10-CM | POA: Diagnosis not present

## 2015-11-09 DIAGNOSIS — M533 Sacrococcygeal disorders, not elsewhere classified: Secondary | ICD-10-CM | POA: Diagnosis not present

## 2015-11-09 DIAGNOSIS — I10 Essential (primary) hypertension: Secondary | ICD-10-CM | POA: Diagnosis not present

## 2015-11-09 DIAGNOSIS — M542 Cervicalgia: Secondary | ICD-10-CM | POA: Diagnosis not present

## 2015-11-09 DIAGNOSIS — Z79899 Other long term (current) drug therapy: Secondary | ICD-10-CM | POA: Diagnosis not present

## 2015-11-09 DIAGNOSIS — I482 Chronic atrial fibrillation: Secondary | ICD-10-CM | POA: Diagnosis not present

## 2015-11-09 DIAGNOSIS — G20 Parkinson's disease: Secondary | ICD-10-CM | POA: Diagnosis not present

## 2015-11-09 DIAGNOSIS — R5381 Other malaise: Secondary | ICD-10-CM | POA: Diagnosis not present

## 2015-11-12 DIAGNOSIS — Z961 Presence of intraocular lens: Secondary | ICD-10-CM | POA: Diagnosis not present

## 2015-11-12 DIAGNOSIS — H52223 Regular astigmatism, bilateral: Secondary | ICD-10-CM | POA: Diagnosis not present

## 2015-11-12 DIAGNOSIS — H43813 Vitreous degeneration, bilateral: Secondary | ICD-10-CM | POA: Diagnosis not present

## 2015-11-12 DIAGNOSIS — H524 Presbyopia: Secondary | ICD-10-CM | POA: Diagnosis not present

## 2015-11-18 ENCOUNTER — Telehealth: Payer: Self-pay | Admitting: Cardiovascular Disease

## 2015-11-18 NOTE — Telephone Encounter (Signed)
New message      Pt c/o medication issue:  1. Name of Medication: xarelto 2. How are you currently taking this medication (dosage and times per day)?  20mg  daily 3. Are you having a reaction (difficulty breathing--STAT)? no 4. What is your medication issue?  Pt received a letter stating that her ins company will not pay for xarelto beginning jan 2018.  They suggested eliquis and pradaxa. Please call

## 2015-11-18 NOTE — Telephone Encounter (Signed)
Ok to change to [employer] when needed for insurance purposes. She should be on Eliquis 2.5mg  BID based on weight and age.

## 2015-11-18 NOTE — Telephone Encounter (Signed)
Pt advised on recommendations and will call back when ready to switch from Xarelto to Eliquis 2.5mg  BID.

## 2015-11-18 NOTE — Telephone Encounter (Signed)
Returned call. Explained usual process for switching from Xarelto to BID anticoag meds. Pt aware I will defer for change recommendations and follow up with her. She is also aware we would plan to make switch at start of plan year, so she would need to call us to notify when she wants to have [place] send Rx to pharmacy.

## 2015-11-30 ENCOUNTER — Other Ambulatory Visit: Payer: Self-pay | Admitting: Cardiovascular Disease

## 2015-11-30 MED ORDER — RIVAROXABAN 20 MG PO TABS: 20.0000 mg | ORAL_TABLET | Freq: Every day | ORAL | 1 refills | Status: DC

## 2015-11-30 NOTE — Telephone Encounter (Signed)
Advised pt. We are currently out of Xarelto 20 samples. Sent Rx to [facility]. Pt stated she will be switching to Eliquis in January per Dr. Sallyanne Kuster. She is in the donut hole and will need samples for that when she starts it.

## 2015-11-30 NOTE — Telephone Encounter (Signed)
New message  PT call requesting to speak with RN. Pt states she is to take Xarelto until January and is running out of med. Pt would like to know if she could get samples of med to last until January. If not pt ask for a refill of the medication. Please call back to discuss

## 2015-11-30 NOTE — Telephone Encounter (Signed)
Rx request sent to pharmacy.  

## 2015-12-02 ENCOUNTER — Telehealth: Payer: Self-pay | Admitting: Cardiology

## 2015-12-02 ENCOUNTER — Encounter: Payer: Medicare Other | Admitting: *Deleted

## 2015-12-02 NOTE — Telephone Encounter (Signed)
Spoke with pt and reminded pt of remote transmission that is due today. Pt verbalized understanding.   

## 2015-12-04 ENCOUNTER — Encounter: Payer: Self-pay | Admitting: Cardiology

## 2015-12-11 ENCOUNTER — Ambulatory Visit (INDEPENDENT_AMBULATORY_CARE_PROVIDER_SITE_OTHER): Payer: Medicare Other | Admitting: *Deleted

## 2015-12-11 DIAGNOSIS — I495 Sick sinus syndrome: Secondary | ICD-10-CM

## 2015-12-11 NOTE — Progress Notes (Signed)
Remote pacemaker transmission.   

## 2015-12-16 ENCOUNTER — Encounter: Payer: Self-pay | Admitting: Cardiology

## 2015-12-19 LAB — CUP PACEART REMOTE DEVICE CHECK
Battery Impedance: 255 Ohm
Battery Voltage: 2.79 V
Brady Statistic AP VP Percent: 0 %
Brady Statistic AS VS Percent: 57 %
Implantable Lead Location: 753860
Implantable Lead Model: 5076
Implantable Lead Model: 5076
Implantable Pulse Generator Implant Date: 20130611
Lead Channel Impedance Value: 424 Ohm
Lead Channel Pacing Threshold Amplitude: 0.5 V
Lead Channel Sensing Intrinsic Amplitude: 11.2 mV
Lead Channel Sensing Intrinsic Amplitude: 2.8 mV
Lead Channel Setting Pacing Pulse Width: 0.4 ms
MDC IDC LEAD IMPLANT DT: 20130611
MDC IDC LEAD IMPLANT DT: 20130611
MDC IDC LEAD LOCATION: 753859
MDC IDC MSMT BATTERY REMAINING LONGEVITY: 132 mo
MDC IDC MSMT LEADCHNL RA PACING THRESHOLD PULSEWIDTH: 0.4 ms
MDC IDC MSMT LEADCHNL RV IMPEDANCE VALUE: 578 Ohm
MDC IDC MSMT LEADCHNL RV PACING THRESHOLD AMPLITUDE: 0.875 V
MDC IDC MSMT LEADCHNL RV PACING THRESHOLD PULSEWIDTH: 0.4 ms
MDC IDC SESS DTM: 20171117162152
MDC IDC SET LEADCHNL RA PACING AMPLITUDE: 1.5 V
MDC IDC SET LEADCHNL RV PACING AMPLITUDE: 2 V
MDC IDC SET LEADCHNL RV SENSING SENSITIVITY: 5.6 mV
MDC IDC STAT BRADY AP VS PERCENT: 43 %
MDC IDC STAT BRADY AS VP PERCENT: 0 %

## 2015-12-22 DIAGNOSIS — I739 Peripheral vascular disease, unspecified: Secondary | ICD-10-CM | POA: Diagnosis not present

## 2016-01-04 ENCOUNTER — Other Ambulatory Visit: Payer: Self-pay | Admitting: Adult Health

## 2016-01-05 DIAGNOSIS — H16223 Keratoconjunctivitis sicca, not specified as Sjogren's, bilateral: Secondary | ICD-10-CM | POA: Diagnosis not present

## 2016-02-04 ENCOUNTER — Telehealth: Payer: Self-pay | Admitting: Cardiovascular Disease

## 2016-02-04 MED ORDER — APIXABAN 2.5 MG PO TABS: 2.5000 mg | ORAL_TABLET | Freq: Two times a day (BID) | ORAL | 1 refills | Status: DC

## 2016-02-04 NOTE — Telephone Encounter (Signed)
New Message     *STAT* If patient is at the pharmacy, call can be transferred to refill team.   1. Which medications need to be refilled? (please list name of each medication and dose if known) Eliquis 2. Which pharmacy/location (including street and city if local pharmacy) is medication to be sent to? [facility] in [place], [place] 3. Do they need a 30 day or 90 day supply? 90 day  Insurance will no longer pay for Xarelto, and she was told to switch to Eliquis.

## 2016-02-04 NOTE — Telephone Encounter (Signed)
Rx has been sent to the pharmacy electronically. Eliqius 2.5 mg #90 1 refill send into pt pharmacy

## 2016-02-16 DIAGNOSIS — Z1389 Encounter for screening for other disorder: Secondary | ICD-10-CM | POA: Diagnosis not present

## 2016-02-16 DIAGNOSIS — Z6820 Body mass index (BMI) 20.0-20.9, adult: Secondary | ICD-10-CM | POA: Diagnosis not present

## 2016-02-16 DIAGNOSIS — J9801 Acute bronchospasm: Secondary | ICD-10-CM | POA: Diagnosis not present

## 2016-02-16 DIAGNOSIS — J329 Chronic sinusitis, unspecified: Secondary | ICD-10-CM | POA: Diagnosis not present

## 2016-03-02 ENCOUNTER — Encounter: Payer: Self-pay | Admitting: Cardiovascular Disease

## 2016-03-02 ENCOUNTER — Ambulatory Visit (INDEPENDENT_AMBULATORY_CARE_PROVIDER_SITE_OTHER): Payer: Medicare Other | Admitting: Cardiovascular Disease

## 2016-03-02 VITALS — BP 120/80 | HR 70 | Ht 66.0 in | Wt 122.6 lb

## 2016-03-02 DIAGNOSIS — E7800 Pure hypercholesterolemia, unspecified: Secondary | ICD-10-CM

## 2016-03-02 DIAGNOSIS — I495 Sick sinus syndrome: Secondary | ICD-10-CM | POA: Diagnosis not present

## 2016-03-02 DIAGNOSIS — I480 Paroxysmal atrial fibrillation: Secondary | ICD-10-CM

## 2016-03-02 DIAGNOSIS — Z950 Presence of cardiac pacemaker: Secondary | ICD-10-CM

## 2016-03-02 DIAGNOSIS — I951 Orthostatic hypotension: Secondary | ICD-10-CM

## 2016-03-02 LAB — CUP PACEART INCLINIC DEVICE CHECK
Battery Remaining Longevity: 134 mo
Battery Voltage: 2.79 V
Brady Statistic AP VP Percent: 0 %
Brady Statistic AS VP Percent: 0 %
Brady Statistic AS VS Percent: 61 %
Implantable Lead Implant Date: 20130611
Implantable Lead Location: 753859
Implantable Lead Model: 5076
Implantable Pulse Generator Implant Date: 20130611
Lead Channel Impedance Value: 461 Ohm
Lead Channel Pacing Threshold Amplitude: 0.625 V
Lead Channel Pacing Threshold Amplitude: 0.75 V
Lead Channel Pacing Threshold Pulse Width: 0.4 ms
Lead Channel Pacing Threshold Pulse Width: 0.4 ms
Lead Channel Pacing Threshold Pulse Width: 0.4 ms
Lead Channel Sensing Intrinsic Amplitude: 4 mV
Lead Channel Setting Pacing Amplitude: 2 V
MDC IDC LEAD IMPLANT DT: 20130611
MDC IDC LEAD LOCATION: 753860
MDC IDC MSMT BATTERY IMPEDANCE: 255 Ohm
MDC IDC MSMT LEADCHNL RA PACING THRESHOLD AMPLITUDE: 0.75 V
MDC IDC MSMT LEADCHNL RA PACING THRESHOLD PULSEWIDTH: 0.4 ms
MDC IDC MSMT LEADCHNL RV IMPEDANCE VALUE: 599 Ohm
MDC IDC MSMT LEADCHNL RV PACING THRESHOLD AMPLITUDE: 0.75 V
MDC IDC MSMT LEADCHNL RV SENSING INTR AMPL: 22.4 mV
MDC IDC SESS DTM: 20180207123819
MDC IDC SET LEADCHNL RA PACING AMPLITUDE: 1.5 V
MDC IDC SET LEADCHNL RV PACING PULSEWIDTH: 0.4 ms
MDC IDC SET LEADCHNL RV SENSING SENSITIVITY: 5.6 mV
MDC IDC STAT BRADY AP VS PERCENT: 39 %

## 2016-03-02 LAB — PACEMAKER DEVICE OBSERVATION

## 2016-03-02 NOTE — Progress Notes (Signed)
Patient ID: Abigail Taylor, female   DOB: 03-15-1931, 83 y.o.   MRN: [id number]    Cardiology Office Note    Date:  03/02/2016   ID:  DEKISHA ZEEB, DOB 01-25-1931, MRN [id number]  PCP:  Glo Herring., MD  Cardiologist:   [name], MD   Chief Complaint  Patient presents with  . Follow-up    History of Present Illness:  MAKAILEY Taylor is a 83 y.o. female with a history of sinus node dysfunction, recurrent paroxysmal and persistent atrial fibrillation with rapid ventricular response, tachycardia-bradycardia syndrome, status post implantation of a dual-chamber permanent pacemaker (Medtronic Adapta), Orthostatic hypotension related to Parkinson's syndrome.   Continues to be troubled by weakness and near-syncope if she stands up too long. She has immediate relief after lying down. She has recorded really low diastolic blood pressures around 40 at these times. She has not had falls or full blown syncope.  She has not had rapid palpitations. She denies angina, dyspnea, leg edema, orthopnea, PND, full blown syncope. Complains of poor appetite and a weak voice.  she is doing a very poor job of staying well hydrated. Her favorite drink is [facility] which may actually have a net diuretic effect. She does not like to drink water. She occasionally drinks or issues. She hasn't really made an effort to include more sodium in her diet. She is not taking fludrocortisone prescribed.  His major interrogation shows normal device function. Her Medtronic Adapta dual-chamber device implanted in 2013 still has roughly 11 years of generator longevity. Her leads are both model 5076 leads. Lead parameters are all excellent and essentially unchanged from the past. She has roughly 39% atrial pacing and only 0.1% ventricular pacing. The burden of atrial fibrillation is low at 0.2% with only one episode that lasted longer than a few minutes. Secured a year ago on February 22 and lasted almost 13 hours.  Ventricular rate control is generally good. She does have brief episodes of high ventricular rates that most likely represent atrial tachycardia with 1:1 conduction.  She is taking anticoagulation with Xarelto without any complications. She is on long-standing treatment with statin for hyperlipidemia. She had normal coronary arteries by remote angiogram performed in 2001 and a normal nuclear stress test in 2013.    Past Medical History:  Diagnosis Date  . Angina at rest, with the tachycardia 07/05/2011  . Aortic insufficiency 07/05/2011  . Atrial fibrillation ([place])   . Brady-tachy syndrome ([place])    Dual chamber Medtronic pacemaker Adapta  . Chronic back pain   . Constipation 12/27/2011  . Gait instability   . Hyperlipidemia   . Hypothyroidism   . Normal cardiac stress test 2013  . OA (osteoarthritis)   . Pacemaker   . Parkinson's disease ([place])   . Tremor     Past Surgical History:  Procedure Laterality Date  . ABDOMINAL HYSTERECTOMY    . CARDIAC CATHETERIZATION  05/26/1999   normal  . CARDIAC CATHETERIZATION  2001   normal coronary arteries  . COLONOSCOPY Abigail Taylor 11/15/2012   Procedure: COLONOSCOPY;  Surgeon: [name], MD;  [facility];  Service: Endoscopy;  Laterality: Abigail Taylor;  1030  . INSERT / REPLACE / REMOVE PACEMAKER    . NM MYOVIEW LTD  07/19/2011   normal  . PERMANENT PACEMAKER INSERTION  07/05/2011   Medtronic Adapta dual chamber  . PERMANENT PACEMAKER INSERTION Abigail Taylor 07/05/2011   Procedure: PERMANENT PACEMAKER INSERTION;  Surgeon: [name], MD;  Location: [facility] CATH  LAB;  Service: Cardiovascular;  Laterality: Abigail Taylor;    Current Medications: Outpatient Medications Prior to Visit  Medication Sig Dispense Refill  . apixaban (ELIQUIS) 2.5 MG TABS tablet Take 1 tablet (2.5 mg total) by mouth 2 (two) times daily. 180 tablet 1  . carbidopa-levodopa (SINEMET IR) 25-100 MG per tablet Take 1 tablet by mouth 3 (three) times daily.     Marland Kitchen diltiazem (CARDIZEM) 30 MG tablet  Take 1 tablet (30 mg total) by mouth daily as needed (for rapid palpitations only). 30 tablet 2  . fludrocortisone (FLORINEF) 0.1 MG tablet TAKE ONE TABLET BY MOUTH ONCE DAILY. 90 tablet 0  . levothyroxine (SYNTHROID, LEVOTHROID) 50 MCG tablet Take 50 mcg by mouth daily.    . rosuvastatin (CRESTOR) 10 MG tablet Take 10 mg by mouth at bedtime. Reported on 07/09/2015     No facility-administered medications prior to visit.      Allergies:   Patient has no known allergies.   Social History   Social History  . Marital status: Married    Spouse name: Abigail Taylor  . Number of children: Abigail Taylor  . Years of education: Abigail Taylor   Social History Main Topics  . Smoking status: Never Smoker  . Smokeless tobacco: Never Used  . Alcohol use No  . Drug use: No  . Sexual activity: Not Asked   Other Topics Concern  . None   Social History Narrative  . None     Family History:  The patient's family history includes Cancer in her father; Colon cancer (age of onset: 91) in her mother; Heart attack in her mother.   ROS:   Please see the history of present illness.    ROS All other systems reviewed and are negative.   PHYSICAL EXAM:   VS:  BP 120/80   Pulse 70   Ht 5\' 6"  (1.676 m)   Wt 55.6 kg (122 lb 9.6 oz)   BMI 19.79 kg/m    GEN: Well nourished, well developed, in no acute distress  HEENT: normal  Neck: no JVD, carotid bruits, or masses Cardiac: RRR with ectopic beats often a pattern of quadrigeminy; widely split second heart sound; no murmurs, rubs, or gallops,no edema, healthy PM site left subclavian.  Respiratory:  clear to auscultation bilaterally, normal work of breathing GI: soft, nontender, nondistended, + BS MS: no deformity or atrophy  Skin: warm and dry, no rash Neuro:  Alert and Oriented x 3, Strength and sensation are intact Psych: euthymic mood, full affect  Wt Readings from Last 3 Encounters:  03/02/16 55.6 kg (122 lb 9.6 oz)  09/02/15 56.2 kg (124 lb)  07/09/15 58.7 kg (129 lb 8  oz)      Studies/Labs Reviewed:   EKG:  EKG is ordered today. It shows atrial paced rhythm with pre-existing right bundle branch block. QTC prolonged at 492 ms, but QRS is also prolonged at 126 ms  Recent Labs: 04/30/2015: ALT 8; BUN 12; Creat 0.74; Potassium 3.5; Sodium 142   Lipid Panel    Component Value Date/Time   CHOL 133 04/30/2015 0853   TRIG 84 04/30/2015 0853   HDL 54 04/30/2015 0853   CHOLHDL 2.5 04/30/2015 0853   VLDL 17 04/30/2015 0853   LDLCALC 62 04/30/2015 0853     ASSESSMENT:    1. Paroxysmal atrial fibrillation (HCC)   2. SSS (sick sinus syndrome) ([place])   3. Pacemaker   4. Orthostatic hypotension   5. Hypercholesterolemia      PLAN:  In  order of problems listed above:  1. Afib: Arrhythmia burden is very low and the arrhythmia is asymptomatic. Rate control medications limited by hypotension. He has not had to take any of the immediate release diltiazem therapy prescribed at previous appointment.. Overall burden of arrhythmia has not yet justified use of true antiarrhythmics like amiodarone. Tolerating Xarelto without bleeding complications. No falls. CHADSVasc 3 (age, gender) but no history of previous stroke/TIA or other embolic events. 2. SSS: Symptoms well controlled with current pacemaker settings. Activities limited primarily by back pain Parkinson's rather than her cardiac robins. 3. PM: Normal device function. Continue remote downloads every 3 months. 4. Orthostatic hypotension: Need to keep the balance between supine hypertension and orthostatic hypotension. No evidence of hypervolemia/heart failure. To take the prescribed fludrocortisone 0.1 mg/day. This dose can be increased if needed. Avoid caffeinated beverages. Drink more fluids. 5. HLP: All parameters within target range    Medication Adjustments/Labs and Tests Ordered: Current medicines are reviewed at length with the patient today.  Concerns regarding medicines are outlined above.   Medication changes, Labs and Tests ordered today are listed in the Patient Instructions below. Patient Instructions  Dr Sallyanne Kuster recommends that you continue on your current medications as directed. Please refer to the Current Medication list given to you today.  Remote monitoring is used to monitor your Pacemaker of ICD from home. This monitoring reduces the number of office visits required to check your device to one time per year. It allows [place] to keep an eye on the functioning of your device to ensure it is working properly. You are scheduled for a device check from home on Wednesday, May 9th, 2018. You may send your transmission at any time that day. If you have a wireless device, the transmission will be sent automatically. After your physician reviews your transmission, you will receive a postcard with your next transmission date.  Dr Sallyanne Kuster recommends that you schedule a follow-up appointment in 12 months with a pacemaker check. You will receive a reminder letter in the mail two months in advance. If you don't receive a letter, please call our office to schedule the follow-up appointment.  If you need a refill on your cardiac medications before your next appointment, please call your pharmacy.    Signed, [name], MD  03/02/2016 2:36 PM    [facility] [place], [place], [place]  [address] Phone: [phone]; Fax: [phone]

## 2016-03-02 NOTE — Patient Instructions (Signed)
Dr Sallyanne Kuster recommends that you continue on your current medications as directed. Please refer to the Current Medication list given to you today.  Remote monitoring is used to monitor your Pacemaker of ICD from home. This monitoring reduces the number of office visits required to check your device to one time per year. It allows [place] to keep an eye on the functioning of your device to ensure it is working properly. You are scheduled for a device check from home on Wednesday, May 9th, 2018. You may send your transmission at any time that day. If you have a wireless device, the transmission will be sent automatically. After your physician reviews your transmission, you will receive a postcard with your next transmission date.  Dr Sallyanne Kuster recommends that you schedule a follow-up appointment in 12 months with a pacemaker check. You will receive a reminder letter in the mail two months in advance. If you don't receive a letter, please call our office to schedule the follow-up appointment.  If you need a refill on your cardiac medications before your next appointment, please call your pharmacy.

## 2016-03-03 ENCOUNTER — Emergency Department (HOSPITAL_COMMUNITY): Payer: Medicare Other

## 2016-03-03 ENCOUNTER — Encounter (HOSPITAL_COMMUNITY): Payer: Self-pay | Admitting: Emergency Medicine

## 2016-03-03 ENCOUNTER — Emergency Department (HOSPITAL_COMMUNITY)
Admission: EM | Admit: 2016-03-03 | Discharge: 2016-03-03 | Disposition: A | Discharge status: Home / Self Care | Payer: Medicare Other | Attending: Emergency Medicine | Admitting: Emergency Medicine

## 2016-03-03 DIAGNOSIS — I4891 Unspecified atrial fibrillation: Secondary | ICD-10-CM | POA: Diagnosis not present

## 2016-03-03 DIAGNOSIS — R002 Palpitations: Secondary | ICD-10-CM | POA: Diagnosis present

## 2016-03-03 DIAGNOSIS — Z79899 Other long term (current) drug therapy: Secondary | ICD-10-CM | POA: Diagnosis not present

## 2016-03-03 DIAGNOSIS — Z7901 Long term (current) use of anticoagulants: Secondary | ICD-10-CM | POA: Diagnosis not present

## 2016-03-03 DIAGNOSIS — Z950 Presence of cardiac pacemaker: Secondary | ICD-10-CM | POA: Diagnosis not present

## 2016-03-03 DIAGNOSIS — G20 Parkinson's disease: Secondary | ICD-10-CM | POA: Diagnosis not present

## 2016-03-03 DIAGNOSIS — R0789 Other chest pain: Secondary | ICD-10-CM | POA: Diagnosis not present

## 2016-03-03 DIAGNOSIS — R000 Tachycardia, unspecified: Secondary | ICD-10-CM | POA: Diagnosis not present

## 2016-03-03 DIAGNOSIS — E039 Hypothyroidism, unspecified: Secondary | ICD-10-CM | POA: Insufficient documentation

## 2016-03-03 LAB — BASIC METABOLIC PANEL
ANION GAP: 11 (ref 5–15)
BUN: 15 mg/dL (ref 6–20)
CHLORIDE: 105 mmol/L (ref 101–111)
CO2: 27 mmol/L (ref 22–32)
Calcium: 8.9 mg/dL (ref 8.9–10.3)
Creatinine, Ser: 0.74 mg/dL (ref 0.44–1.00)
GFR calc Af Amer: >60 mL/min (ref >60)
Glucose, Bld: 101 mg/dL — ABNORMAL HIGH (ref 65–99)
POTASSIUM: 3.3 mmol/L — AB (ref 3.5–5.1)
Sodium: 143 mmol/L (ref 135–145)

## 2016-03-03 LAB — MAGNESIUM: MAGNESIUM: 1.8 mg/dL (ref 1.7–2.4)

## 2016-03-03 LAB — CBC
HEMATOCRIT: 44.1 % (ref 36.0–46.0)
HEMOGLOBIN: 14.6 g/dL (ref 12.0–15.0)
MCH: 28.2 pg (ref 26.0–34.0)
MCHC: 33.1 g/dL (ref 30.0–36.0)
MCV: 85.3 fL (ref 78.0–100.0)
Platelets: 231 10*3/uL (ref 150–400)
RBC: 5.17 MIL/uL — ABNORMAL HIGH (ref 3.87–5.11)
RDW: 13.1 % (ref 11.5–15.5)
WBC: 11.3 10*3/uL — AB (ref 4.0–10.5)

## 2016-03-03 LAB — I-STAT TROPONIN, ED: Troponin i, poc: 0 ng/mL (ref 0.00–0.08)

## 2016-03-03 MED ORDER — ETOMIDATE 2 MG/ML IV SOLN
0.1500 mg/kg | Freq: Once | INTRAVENOUS | Status: AC
  Administered 2016-03-03: 8.34 mg via INTRAVENOUS
  Filled 2016-03-03: qty 10

## 2016-03-03 MED ORDER — SODIUM CHLORIDE 0.9 % IV BOLUS (SEPSIS)
500.0000 mL | Freq: Once | INTRAVENOUS | Status: AC
  Administered 2016-03-03: 500 mL via INTRAVENOUS

## 2016-03-03 MED ORDER — POTASSIUM CHLORIDE CRYS ER 20 MEQ PO TBCR
20.0000 meq | EXTENDED_RELEASE_TABLET | Freq: Once | ORAL | Status: AC
  Administered 2016-03-03: 20 meq via ORAL
  Filled 2016-03-03: qty 1

## 2016-03-03 NOTE — ED Provider Notes (Signed)
[facility] DEPT Provider Note   CSN: [id number] Arrival date & time: 03/03/16  1010     History   Chief Complaint Chief Complaint  Patient presents with  . Palpitations    HPI Abigail Taylor is a 83 y.o. female.  HPI  83 year old female with history of sinus node dysfunction and tachycardia bradycardia syndrome status post pacemaker, recurrent paroxysmal atrial fibrillation on Eliquis, aortic insufficiency and Parkinson's disease. She has been in her usual state of health. This morning, had sudden onset of palpitations. States that she feels a tightness and a choking sensation in her chest. No shortness of breath, syncope or near syncope. No lower extremity edema, orthopnea or PND. Has been eating and drinking normally. No nausea, vomiting, diarrhea, abdominal pain, fevers, or cough.  Followed by Dr. Sallyanne Kuster from cardiology.  Past Medical History:  Diagnosis Date  . Angina at rest, with the tachycardia 07/05/2011  . Aortic insufficiency 07/05/2011  . Atrial fibrillation ([place])   . Brady-tachy syndrome ([place])    Dual chamber Medtronic pacemaker Adapta  . Chronic back pain   . Constipation 12/27/2011  . Gait instability   . Hyperlipidemia   . Hypothyroidism   . Normal cardiac stress test 2013  . OA (osteoarthritis)   . Pacemaker   . Parkinson's disease ([place])   . Tremor     Patient Active Problem List   Diagnosis Date Noted  . SSS (sick sinus syndrome) ([place]) 03/02/2016  . Persistent atrial fibrillation with rapid ventricular response ([place]) 01/16/2015  . Hypotension arterial 01/16/2015  . Normal coronary arteries 2001 05/08/2014  . Atrial fibrillation with RVR ([place]) 05/07/2014  . Pacemaker 08/10/2012  . Aortic insufficiency 08/10/2012  . Constipation 12/27/2011  . Family hx of colon cancer 12/27/2011  . Elevated troponin, trace positive secondary to rapid AF 07/06/2011  . Hypothyroidism 07/05/2011  . Paroxysmal atrial fibrillation ([place]) 07/05/2011  . Sinus pause, 7  seconds post conversion, S/P MDT pacemaker 07/05/11 07/05/2011  . Hypercholesterolemia 07/05/2011  . Angina at rest, with rapid AF 07/05/2011    Past Surgical History:  Procedure Laterality Date  . ABDOMINAL HYSTERECTOMY    . CARDIAC CATHETERIZATION  05/26/1999   normal  . CARDIAC CATHETERIZATION  2001   normal coronary arteries  . COLONOSCOPY N/A 11/15/2012   Procedure: COLONOSCOPY;  Surgeon: [name], MD;  [facility];  Service: Endoscopy;  Laterality: N/A;  1030  . INSERT / REPLACE / REMOVE PACEMAKER    . NM MYOVIEW LTD  07/19/2011   normal  . PERMANENT PACEMAKER INSERTION  07/05/2011   Medtronic Adapta dual chamber  . PERMANENT PACEMAKER INSERTION N/A 07/05/2011   Procedure: PERMANENT PACEMAKER INSERTION;  Surgeon: [name], MD;  Location: [facility] CATH LAB;  Service: Cardiovascular;  Laterality: N/A;    OB History    No data available       Home Medications    Prior to Admission medications   Medication Sig Start Date End Date Taking? Authorizing Provider  apixaban (ELIQUIS) 2.5 MG TABS tablet Take 1 tablet (2.5 mg total) by mouth 2 (two) times daily. 02/04/16  Yes Mihai Croitoru, MD  carbidopa-levodopa (SINEMET IR) 25-100 MG per tablet Take 1 tablet by mouth 3 (three) times daily.    Yes Historical Provider, MD  diltiazem (CARDIZEM) 30 MG tablet Take 1 tablet (30 mg total) by mouth daily as needed (for rapid palpitations only). 09/02/15  Yes Mihai Croitoru, MD  fludrocortisone (FLORINEF) 0.1 MG tablet TAKE ONE TABLET BY MOUTH  ONCE DAILY. 01/05/16  Yes Mihai Croitoru, MD  levothyroxine (SYNTHROID, LEVOTHROID) 50 MCG tablet Take 50 mcg by mouth every evening.    Yes Historical Provider, MD  rosuvastatin (CRESTOR) 10 MG tablet Take 10 mg by mouth at bedtime. Reported on 07/09/2015   Yes Historical Provider, MD    Family History Family History  Problem Relation Age of Onset  . Colon cancer Mother 31  . Heart attack Mother   . Cancer Father     Social  History Social History  Substance Use Topics  . Smoking status: Never Smoker  . Smokeless tobacco: Never Used  . Alcohol use No     Allergies   Patient has no known allergies.   Review of Systems Review of Systems 10/14 systems reviewed and are negative other than those stated in the HPI   Physical Exam Updated Vital Signs BP 180/76 (BP Location: Right Arm)   Pulse 80   Temp 97.5 F (36.4 C) (Oral)   Resp 18   SpO2 100%   Physical Exam Physical Exam  Nursing note and vitals reviewed. Constitutional: Non-toxic, and in no acute distress Head: Normocephalic and atraumatic.  Mouth/Throat: Oropharynx is clear and moist.  Neck: Normal range of motion. Neck supple.  Cardiovascular: Tachycardic rate and irregularly irregular rhythm.   no lower extremity edema Pulmonary/Chest: Effort normal and breath sounds normal.  Abdominal: Soft. There is no tenderness. There is no rebound and no guarding.  Musculoskeletal: Normal range of motion.  Neurological: Alert, no facial droop, fluent speech, moves all extremities symmetrically Skin: Skin is warm and dry.  Psychiatric: Cooperative   ED Treatments / Results  Labs (all labs ordered are listed, but only abnormal results are displayed) Labs Reviewed  BASIC METABOLIC PANEL - Abnormal; Notable for the following:       Result Value   Potassium 3.3 (*)    Glucose, Bld 101 (*)    All other components within normal limits  CBC - Abnormal; Notable for the following:    WBC 11.3 (*)    RBC 5.17 (*)    All other components within normal limits  MAGNESIUM  I-STAT TROPOININ, ED    EKG  EKG Interpretation  Date/Time:  Thursday March 03 2016 10:20:38 EST Ventricular Rate:  129 PR Interval:    QRS Duration: 114 QT Interval:  304 QTC Calculation: 445 R Axis:   84 Text Interpretation:  Undetermined rhythm Incomplete right bundle branch block Marked ST abnormality, possible inferolateral subendocardial injury Abnormal ECG atrial  fibrillation with RVR  Confirmed by Elfego Giammarino MD, Hinton Dyer [id number]) on 03/03/2016 10:51:45 AM Also confirmed by Oleta Mouse MD, Atziry Baranski [phone]), editor [place] CT, Leda Gauze [id number])  on 03/03/2016 2:39:28 PM       Radiology Dg Chest 2 View  Result Date: 03/03/2016 CLINICAL DATA:  83 year old female with chest pressure and palpitations this morning. Initial encounter. EXAM: CHEST  2 VIEW COMPARISON:  07/06/2011.  Portable chest 01/06/2015. FINDINGS: Chronic large lung volumes, stable since 2013. Stable left chest stool lead cardiac pacemaker. Stable mild cardiomegaly. Other mediastinal contours are within normal limits. Visualized tracheal air column is within normal limits. No pneumothorax, pulmonary edema, pleural effusion or confluent pulmonary opacity. Chronic mild increased interstitial markings. Osteopenia. No acute osseous abnormality identified. Negative visible bowel gas pattern. IMPRESSION: No acute cardiopulmonary abnormality. Electronically Signed   By: Genevie Ann M.D.   On: 03/03/2016 10:38    Procedures .Cardioversion Date/Time: 03/03/2016 3:47 PM Performed by: Brantley Stage DUO Authorized by: Brantley Stage  DUO   Consent:    Consent obtained:  Verbal   Consent given by:  Patient   Risks discussed:  Cutaneous burn, pain and induced arrhythmia   Alternatives discussed:  No treatment and rate-control medication Pre-procedure details:    Cardioversion basis:  Elective   Rhythm:  Atrial fibrillation   Electrode placement:  Anterior-posterior Attempt one:    Cardioversion mode:  Synchronous   Waveform:  Biphasic   Shock (Joules):  200   Shock outcome:  Conversion to other rhythm (to paced baseline rhythm) Post-procedure details:    Patient status:  Awake   Patient tolerance of procedure:  Tolerated well, no immediate complications   (including critical care time) CRITICAL CARE Performed by: Forde Dandy   Total critical care time: 35 minutes  Critical care time was exclusive of separately billable procedures  and treating other patients.  Critical care was necessary to treat or prevent imminent or life-threatening deterioration.  Critical care was time spent personally by me on the following activities: development of treatment plan with patient and/or surrogate as well as nursing, discussions with consultants, evaluation of patient's response to treatment, examination of patient, obtaining history from patient or surrogate, ordering and performing treatments and interventions, ordering and review of laboratory studies, ordering and review of radiographic studies, pulse oximetry and re-evaluation of patient's condition.  Procedural sedation Performed by: Forde Dandy Consent: Verbal consent obtained. Risks and benefits: risks, benefits and alternatives were discussed Required items: required blood products, implants, devices, and special equipment available Patient identity confirmed: arm band and provided demographic data Time out: Immediately prior to procedure a "time out" was called to verify the correct patient, procedure, equipment, support staff and site/side marked as required.  Sedation type: moderate (conscious) sedation NPO time confirmed and considedered  Sedatives: ETOMIDATE  Physician Time at Bedside: 35 minutes  Vitals: Vital signs were monitored during sedation. Cardiac Monitor, pulse oximeter Patient tolerance: Patient tolerated the procedure well with no immediate complications. Comments: Pt with uneventful recovered. Returned to pre-procedural sedation baseline    Medications Ordered in ED Medications  sodium chloride 0.9 % bolus 500 mL (0 mLs Intravenous Stopped 03/03/16 1231)  potassium chloride SA (K-DUR,KLOR-CON) CR tablet 20 mEq (20 mEq Oral Given 03/03/16 1328)  etomidate (AMIDATE) injection 8.34 mg (8.34 mg Intravenous Given 03/03/16 1423)     Initial Impression / Assessment and Plan / ED Course  I have reviewed the triage vital signs and the nursing  notes.  Pertinent labs & imaging results that were available during my care of the patient were reviewed by me and considered in my medical decision making (see chart for details).     Presents with atrial fibrillation with RVR with heart rate in the 130s to 140s. Is normotensive, and in no respiratory distress. No dehydration, recent illnesses. She does have minimal hypokalemia of 3.3 and given oral repletion. No other major metabolic or electrolyte derangements. Patient has been compliant with Eliquis. She is a good candidate for cardioversion. Discussed with Dr. Radford Pax who agrees. Cardioverted under sedation with conversion into a paced rhythm which is her baseline. Observed in ED and awake, mentating normally and feeling back to baseline. Safe for discharge with close follow-up with Dr. Recardo Evangelist as outpatient.  This patients CHA2DS2-VASc Score and unadjusted Ischemic Stroke Rate (% per year) is equal to 3.2 % stroke rate/year from a score of 3     Final Clinical Impressions(s) / ED Diagnoses   Final diagnoses:  Atrial fibrillation  with RVR [facility])    New Prescriptions New Prescriptions   No medications on file     Forde Dandy, MD 03/03/16 [phone]

## 2016-03-03 NOTE — Sedation Documentation (Signed)
Pt now resting shock delivered at 200joules

## 2016-03-03 NOTE — Discharge Instructions (Addendum)
Your were shocked out of atrial fibrillation today.   Please call your cardiologist's office to set up close follow-up. Otherwise arrange appointment in atrial fibrillation clinic at [phone].  Return without fail for worsening symptoms, including difficulty breathing, passing out, severe chest pain, or any other symptoms concerning to you.

## 2016-03-03 NOTE — ED Triage Notes (Signed)
Pt sts palpitations starting this am; pt sts hx of afib and feels same at present

## 2016-03-03 NOTE — Sedation Documentation (Signed)
RT at bedside pt remains unresponsive still maintaining own airway remains on monitor with VS WNL

## 2016-03-03 NOTE — Sedation Documentation (Signed)
Pt remains unresponsive with shallow breathing though still able to maintain own airway pt coughing though is unresponsive

## 2016-03-03 NOTE — Sedation Documentation (Signed)
Pt remains awake and alert Md, PA RT and CNA with this RN at bedside

## 2016-03-03 NOTE — Sedation Documentation (Signed)
Team remains at the bedside pt still unresponsive able to maintain own airway will respond with verbal stimluli

## 2016-03-03 NOTE — Sedation Documentation (Signed)
Pt now waking up able to respond with minimal stimulation

## 2016-03-07 ENCOUNTER — Ambulatory Visit (HOSPITAL_COMMUNITY)
Admission: RE | Admit: 2016-03-07 | Discharge: 2016-03-07 | Disposition: A | Discharge status: Home / Self Care | Payer: Medicare Other | Source: Ambulatory Visit | Attending: Nurse Practitioner | Admitting: Nurse Practitioner

## 2016-03-07 ENCOUNTER — Encounter (HOSPITAL_COMMUNITY): Payer: Self-pay | Admitting: Nurse Practitioner

## 2016-03-07 VITALS — BP 130/78 | HR 74 | Ht 66.0 in | Wt 123.4 lb

## 2016-03-07 DIAGNOSIS — E039 Hypothyroidism, unspecified: Secondary | ICD-10-CM | POA: Diagnosis not present

## 2016-03-07 DIAGNOSIS — I480 Paroxysmal atrial fibrillation: Secondary | ICD-10-CM | POA: Diagnosis not present

## 2016-03-07 DIAGNOSIS — R269 Unspecified abnormalities of gait and mobility: Secondary | ICD-10-CM | POA: Insufficient documentation

## 2016-03-07 DIAGNOSIS — G20 Parkinson's disease: Secondary | ICD-10-CM | POA: Diagnosis not present

## 2016-03-07 DIAGNOSIS — M1990 Unspecified osteoarthritis, unspecified site: Secondary | ICD-10-CM | POA: Diagnosis not present

## 2016-03-07 DIAGNOSIS — Z950 Presence of cardiac pacemaker: Secondary | ICD-10-CM | POA: Insufficient documentation

## 2016-03-07 DIAGNOSIS — E785 Hyperlipidemia, unspecified: Secondary | ICD-10-CM | POA: Insufficient documentation

## 2016-03-07 DIAGNOSIS — G8929 Other chronic pain: Secondary | ICD-10-CM | POA: Insufficient documentation

## 2016-03-07 DIAGNOSIS — I4510 Unspecified right bundle-branch block: Secondary | ICD-10-CM | POA: Insufficient documentation

## 2016-03-07 DIAGNOSIS — M545 Low back pain: Secondary | ICD-10-CM | POA: Insufficient documentation

## 2016-03-07 DIAGNOSIS — I495 Sick sinus syndrome: Secondary | ICD-10-CM | POA: Insufficient documentation

## 2016-03-07 NOTE — Progress Notes (Signed)
Thank you, Butch Penny. She timed it perfectly, didn't she?!

## 2016-03-07 NOTE — Progress Notes (Signed)
Primary Care Physician: Glo Herring., MD Referring Physician: Dr. Raenette Rover is a 83 y.o. female with a h/o PPM, Parkinsons disease, paroxysmal atrial fibrillation that presented to the ER 2/8 in afib and successfully cardioverted. She has just seen Dr. Loletha Grayer the day before with interrogation of her device and found to have low afib burden. She has not been able to take daily rate control meds due to hypotension. She reports that if she stands for a long period, she will feel lightheaded and will have to sit down.   She states that she woke up in afib the morning of the 8th and was in afib. She took the as needed cardizem and after one hour of afib decided she better go to the ER. She was later cardioverted and d/c home. She has not has any further afib. Is taking her eliquis without missed doses.  She drinks very little water at home and drinks mostly [facility]. No other obvious trigger found.  Today, she denies symptoms of palpitations, chest pain, shortness of breath, orthopnea, PND, lower extremity edema, dizziness, presyncope, syncope, or neurologic sequela. The patient is tolerating medications without difficulties and is otherwise without complaint today.   Past Medical History:  Diagnosis Date  . Angina at rest, with the tachycardia 07/05/2011  . Aortic insufficiency 07/05/2011  . Atrial fibrillation ([place])   . Brady-tachy syndrome ([place])    Dual chamber Medtronic pacemaker Adapta  . Chronic back pain   . Constipation 12/27/2011  . Gait instability   . Hyperlipidemia   . Hypothyroidism   . Normal cardiac stress test 2013  . OA (osteoarthritis)   . Pacemaker   . Parkinson's disease ([place])   . Tremor    Past Surgical History:  Procedure Laterality Date  . ABDOMINAL HYSTERECTOMY    . CARDIAC CATHETERIZATION  05/26/1999   normal  . CARDIAC CATHETERIZATION  2001   normal coronary arteries  . COLONOSCOPY N/A 11/15/2012   Procedure: COLONOSCOPY;  Surgeon: [name], MD;  [facility];  Service: Endoscopy;  Laterality: N/A;  1030  . INSERT / REPLACE / REMOVE PACEMAKER    . NM MYOVIEW LTD  07/19/2011   normal  . PERMANENT PACEMAKER INSERTION  07/05/2011   Medtronic Adapta dual chamber  . PERMANENT PACEMAKER INSERTION N/A 07/05/2011   Procedure: PERMANENT PACEMAKER INSERTION;  Surgeon: [name], MD;  Location: [facility] CATH LAB;  Service: Cardiovascular;  Laterality: N/A;    Current Outpatient Prescriptions  Medication Sig Dispense Refill  . apixaban (ELIQUIS) 2.5 MG TABS tablet Take 1 tablet (2.5 mg total) by mouth 2 (two) times daily. 180 tablet 1  . carbidopa-levodopa (SINEMET IR) 25-100 MG per tablet Take 1 tablet by mouth 3 (three) times daily.     Marland Kitchen diltiazem (CARDIZEM) 30 MG tablet Take 1 tablet (30 mg total) by mouth daily as needed (for rapid palpitations only). 30 tablet 2  . fludrocortisone (FLORINEF) 0.1 MG tablet TAKE ONE TABLET BY MOUTH ONCE DAILY. 90 tablet 0  . levothyroxine (SYNTHROID, LEVOTHROID) 50 MCG tablet Take 50 mcg by mouth every evening.     . rosuvastatin (CRESTOR) 10 MG tablet Take 10 mg by mouth at bedtime. Reported on 07/09/2015     No current facility-administered medications for this encounter.     No Known Allergies  Social History   Social History  . Marital status: Married    Spouse name: N/A  . Number of children: N/A  .  Years of education: N/A   Occupational History  . Not on file.   Social History Main Topics  . Smoking status: Never Smoker  . Smokeless tobacco: Never Used  . Alcohol use No  . Drug use: No  . Sexual activity: Not on file   Other Topics Concern  . Not on file   Social History Narrative  . No narrative on file    Family History  Problem Relation Age of Onset  . Colon cancer Mother 58  . Heart attack Mother   . Cancer Father     ROS- All systems are reviewed and negative except as per the HPI above  Physical Exam: Vitals:   03/07/16 0859  BP: 130/78    Pulse: 74  Weight: 123 lb 6.4 oz (56 kg)  Height: 5\' 6"  (1.676 m)   Wt Readings from Last 3 Encounters:  03/07/16 123 lb 6.4 oz (56 kg)  03/02/16 122 lb 9.6 oz (55.6 kg)  09/02/15 124 lb (56.2 kg)    Labs: Lab Results  Component Value Date   NA 143 03/03/2016   K 3.3 (L) 03/03/2016   CL 105 03/03/2016   CO2 27 03/03/2016   GLUCOSE 101 (H) 03/03/2016   BUN 15 03/03/2016   CREATININE 0.74 03/03/2016   CALCIUM 8.9 03/03/2016   MG 1.8 03/03/2016   Lab Results  Component Value Date   INR 1.15 05/07/2014   Lab Results  Component Value Date   CHOL 133 04/30/2015   HDL 54 04/30/2015   LDLCALC 62 04/30/2015   TRIG 84 04/30/2015     GEN- The patient is well appearing, alert and oriented x 3 today.   Head- normocephalic, atraumatic Eyes-  Sclera clear, conjunctiva pink Ears- hearing intact Oropharynx- clear Neck- supple, no JVP Lymph- no cervical lymphadenopathy Lungs- Clear to ausculation bilaterally, normal work of breathing Heart- Regular rate and rhythm, no murmurs, rubs or gallops, PMI not laterally displaced GI- soft, NT, ND, + BS Extremities- no clubbing, cyanosis, or edema MS- no significant deformity or atrophy Skin- no rash or lesion Psych- euthymic mood, full affect Neuro- strength and sensation are intact  EKG-atrial paced rhythm with RBBBat 74 bpm Epic records reviewed K+ in ER 3.3 and with oral repletion given    Assessment and Plan: 1. Paroxysmal afib S/p successful cardioversion Encouraged less [facility] and to drink more water Will not recommend daily rate control due to h/o hypotension Continue to use Cardizem 30 mg as needed for PAF, but was told that it may take more than one hour to get her back in rhythm  She is not interested in any further change in meds, ie,  antiarrythmic's today Continue eliquis 2.5 mg bid  F/u with Dr. Loletha Grayer as scheduled afib clinic as needed  [place]. Anik Wesch, [facility] [address] [place], [place] [address] [phone]

## 2016-03-08 DIAGNOSIS — I739 Peripheral vascular disease, unspecified: Secondary | ICD-10-CM | POA: Diagnosis not present

## 2016-03-31 ENCOUNTER — Other Ambulatory Visit: Payer: Self-pay | Admitting: Cardiovascular Disease

## 2016-03-31 NOTE — Telephone Encounter (Signed)
REFILL 

## 2016-04-05 ENCOUNTER — Telehealth: Payer: Self-pay | Admitting: Cardiovascular Disease

## 2016-04-05 MED ORDER — FLUDROCORTISONE ACETATE 0.1 MG PO TABS: 100.0000 ug | ORAL_TABLET | Freq: Every day | ORAL | 5 refills | Status: DC

## 2016-04-05 NOTE — Telephone Encounter (Signed)
°*  STAT* If patient is at the pharmacy, call can be transferred to refill team.   1. Which medications need to be refilled? (please list name of each medication and dose if known) fludrocortisone  0.1mg   2. Which pharmacy/location (including street and city if local pharmacy) is medication to be sent to? [facility] in [place]  3. Do they need a 30 day or 90 day supply? 30   She said she has been waiting since last ov

## 2016-04-05 NOTE — Telephone Encounter (Signed)
rx sent electronically.  Patient made aware.

## 2016-04-18 ENCOUNTER — Emergency Department (HOSPITAL_COMMUNITY)
Admission: EM | Admit: 2016-04-18 | Discharge: 2016-04-18 | Disposition: A | Discharge status: Home / Self Care | Payer: Medicare Other | Attending: Emergency Medicine | Admitting: Emergency Medicine

## 2016-04-18 ENCOUNTER — Emergency Department (HOSPITAL_COMMUNITY): Payer: Medicare Other

## 2016-04-18 ENCOUNTER — Other Ambulatory Visit: Payer: Self-pay

## 2016-04-18 ENCOUNTER — Encounter (HOSPITAL_COMMUNITY): Payer: Self-pay

## 2016-04-18 DIAGNOSIS — E039 Hypothyroidism, unspecified: Secondary | ICD-10-CM | POA: Diagnosis not present

## 2016-04-18 DIAGNOSIS — G20 Parkinson's disease: Secondary | ICD-10-CM | POA: Diagnosis not present

## 2016-04-18 DIAGNOSIS — Z7901 Long term (current) use of anticoagulants: Secondary | ICD-10-CM | POA: Insufficient documentation

## 2016-04-18 DIAGNOSIS — Z950 Presence of cardiac pacemaker: Secondary | ICD-10-CM | POA: Insufficient documentation

## 2016-04-18 DIAGNOSIS — R079 Chest pain, unspecified: Secondary | ICD-10-CM | POA: Diagnosis not present

## 2016-04-18 DIAGNOSIS — I4891 Unspecified atrial fibrillation: Secondary | ICD-10-CM

## 2016-04-18 DIAGNOSIS — I480 Paroxysmal atrial fibrillation: Secondary | ICD-10-CM

## 2016-04-18 DIAGNOSIS — R002 Palpitations: Secondary | ICD-10-CM | POA: Diagnosis present

## 2016-04-18 DIAGNOSIS — R000 Tachycardia, unspecified: Secondary | ICD-10-CM | POA: Diagnosis not present

## 2016-04-18 LAB — BASIC METABOLIC PANEL
ANION GAP: 9 (ref 5–15)
BUN: 8 mg/dL (ref 6–20)
CO2: 29 mmol/L (ref 22–32)
Calcium: 9.3 mg/dL (ref 8.9–10.3)
Chloride: 105 mmol/L (ref 101–111)
Creatinine, Ser: 0.75 mg/dL (ref 0.44–1.00)
Glucose, Bld: 118 mg/dL — ABNORMAL HIGH (ref 65–99)
POTASSIUM: 3.5 mmol/L (ref 3.5–5.1)
SODIUM: 143 mmol/L (ref 135–145)

## 2016-04-18 LAB — CBC
HEMATOCRIT: 45.8 % (ref 36.0–46.0)
HEMOGLOBIN: 14.9 g/dL (ref 12.0–15.0)
MCH: 28 pg (ref 26.0–34.0)
MCHC: 32.5 g/dL (ref 30.0–36.0)
MCV: 85.9 fL (ref 78.0–100.0)
Platelets: 216 10*3/uL (ref 150–400)
RBC: 5.33 MIL/uL — ABNORMAL HIGH (ref 3.87–5.11)
RDW: 13.5 % (ref 11.5–15.5)
WBC: 10.5 10*3/uL (ref 4.0–10.5)

## 2016-04-18 NOTE — ED Provider Notes (Signed)
[facility] DEPT Provider Note   CSN: [id number] Arrival date & time: 04/18/16  9476     History   Chief Complaint No chief complaint on file.   HPI Abigail Taylor is a 83 y.o. female.  HPI 83 year old female with history of atrial fibrillation and aortic insufficiency who awoke this morning and felt like she was having palpitations. This is similar to her previous episodes of atrial rib with RVR.  She has a pacemaker in place. He is unable to regularly take rate control medications due to hypotension and orthostatic hypotension. Today she did take her when necessary Cardizem. She is anticoagulated on Zaroxolyn. She was seen here February 8 and had cardioversion done in the emergency department. Past Medical History:  Diagnosis Date  . Angina at rest, with the tachycardia 07/05/2011  . Aortic insufficiency 07/05/2011  . Atrial fibrillation ([place])   . Brady-tachy syndrome ([place])    Dual chamber Medtronic pacemaker Adapta  . Chronic back pain   . Constipation 12/27/2011  . Gait instability   . Hyperlipidemia   . Hypothyroidism   . Normal cardiac stress test 2013  . OA (osteoarthritis)   . Pacemaker   . Parkinson's disease ([place])   . Tremor     Patient Active Problem List   Diagnosis Date Noted  . SSS (sick sinus syndrome) ([place]) 03/02/2016  . Persistent atrial fibrillation with rapid ventricular response ([place]) 01/16/2015  . Hypotension arterial 01/16/2015  . Normal coronary arteries 2001 05/08/2014  . Atrial fibrillation with RVR ([place]) 05/07/2014  . Pacemaker 08/10/2012  . Aortic insufficiency 08/10/2012  . Constipation 12/27/2011  . Family hx of colon cancer 12/27/2011  . Elevated troponin, trace positive secondary to rapid AF 07/06/2011  . Hypothyroidism 07/05/2011  . Paroxysmal atrial fibrillation ([place]) 07/05/2011  . Sinus pause, 7 seconds post conversion, S/P MDT pacemaker 07/05/11 07/05/2011  . Hypercholesterolemia 07/05/2011  . Angina at rest, with rapid AF  07/05/2011    Past Surgical History:  Procedure Laterality Date  . ABDOMINAL HYSTERECTOMY    . CARDIAC CATHETERIZATION  05/26/1999   normal  . CARDIAC CATHETERIZATION  2001   normal coronary arteries  . COLONOSCOPY N/A 11/15/2012   Procedure: COLONOSCOPY;  Surgeon: [name], MD;  Location: [facility];  Service: Endoscopy;  Laterality: N/A;  1030  . INSERT / REPLACE / REMOVE PACEMAKER    . NM MYOVIEW LTD  07/19/2011   normal  . PERMANENT PACEMAKER INSERTION  07/05/2011   Medtronic Adapta dual chamber  . PERMANENT PACEMAKER INSERTION N/A 07/05/2011   Procedure: PERMANENT PACEMAKER INSERTION;  Surgeon: [name], MD;  Location: [facility] CATH LAB;  Service: Cardiovascular;  Laterality: N/A;    OB History    No data available       Home Medications    Prior to Admission medications   Medication Sig Start Date End Date Taking? Authorizing Provider  apixaban (ELIQUIS) 2.5 MG TABS tablet Take 1 tablet (2.5 mg total) by mouth 2 (two) times daily. 02/04/16  Yes Mihai Croitoru, MD  carbidopa-levodopa (SINEMET IR) 25-100 MG per tablet Take 1 tablet by mouth 3 (three) times daily.    Yes Historical Provider, MD  diltiazem (CARDIZEM) 30 MG tablet Take 1 tablet (30 mg total) by mouth daily as needed (for rapid palpitations only). 09/02/15  Yes Mihai Croitoru, MD  fludrocortisone (FLORINEF) 0.1 MG tablet Take 1 tablet (100 mcg total) by mouth daily. 04/05/16  Yes Mihai Croitoru, MD  levothyroxine (SYNTHROID, LEVOTHROID) 50 MCG tablet  Take 50 mcg by mouth every evening.    Yes Historical Provider, MD  rosuvastatin (CRESTOR) 10 MG tablet Take 10 mg by mouth at bedtime. Reported on 07/09/2015   Yes Historical Provider, MD    Family History Family History  Problem Relation Age of Onset  . Colon cancer Mother 2  . Heart attack Mother   . Cancer Father     Social History Social History  Substance Use Topics  . Smoking status: Never Smoker  . Smokeless tobacco: Never Used  . Alcohol use  No     Allergies   Patient has no known allergies.   Review of Systems Review of Systems  All other systems reviewed and are negative.    Physical Exam Updated Vital Signs BP 135/85   Pulse 63   Temp 98.8 F (37.1 C) (Oral)   Resp 19   SpO2 98%   Physical Exam  Constitutional: She is oriented to person, place, and time. She appears well-developed and well-nourished. No distress.  HENT:  Head: Normocephalic and atraumatic.  Right Ear: External ear normal.  Left Ear: External ear normal.  Nose: Nose normal.  Eyes: Conjunctivae and EOM are normal. Pupils are equal, round, and reactive to light.  Neck: Normal range of motion. Neck supple.  Cardiovascular: An irregularly irregular rhythm present. Tachycardia present.   Pulmonary/Chest: Effort normal.  Abdominal: Soft. Bowel sounds are normal.  Musculoskeletal: Normal range of motion.  Neurological: She is alert and oriented to person, place, and time. She exhibits normal muscle tone. Coordination normal.  Skin: Skin is warm and dry.  Psychiatric: She has a normal mood and affect. Her behavior is normal. Thought content normal.  Nursing note and vitals reviewed.    ED Treatments / Results  Labs (all labs ordered are listed, but only abnormal results are displayed) Labs Reviewed  BASIC METABOLIC PANEL - Abnormal; Notable for the following:       Result Value   Glucose, Bld 118 (*)    All other components within normal limits  CBC - Abnormal; Notable for the following:    RBC 5.33 (*)    All other components within normal limits    EKG  EKG Interpretation  Date/Time:  Monday April 18 2016 09:56:23 EDT Ventricular Rate:  132 PR Interval:    QRS Duration: 114 QT Interval:  344 QTC Calculation: 509 R Axis:   85 Text Interpretation:  Atrial fibrillation with rapid ventricular response Right bundle branch block Abnormal ECG Confirmed by Starlena Beil MD, Andee Poles [id number]) on 04/18/2016 10:30:21 AM       Radiology Dg  Chest Portable 1 View  Result Date: 04/18/2016 CLINICAL DATA:  Atrial fibrillation, chest pain EXAM: PORTABLE CHEST 1 VIEW COMPARISON:  03/03/2016 FINDINGS: Cardiac shadow is within normal limits. Pacing device is again seen and stable. The lungs are well aerated bilaterally. No focal infiltrate or sizable effusion is seen. No acute bony abnormality is seen. IMPRESSION: No acute abnormality noted. Electronically Signed   By: Inez Catalina M.D.   On: 04/18/2016 11:08    Procedures Procedures (including critical care time)  Medications Ordered in ED Medications - No data to display   Initial Impression / Assessment and Plan / ED Course  I have reviewed the triage vital signs and the nursing notes.  Pertinent labs & imaging results that were available during my care of the patient were reviewed by me and considered in my medical decision making (see chart for details).    12:51  PM Patient in nsr, bp normal.   Discussed with patient and family and they voice understanding of need for follow-up.  Vitals:   04/18/16 1145 04/18/16 1200 04/18/16 1215 04/18/16 1230  BP: 127/63 (!) 137/59 (!) 141/57 135/85  Pulse: (!) 59 60 (!) 58 63  Resp: 12 13 17 19   Temp:      TempSrc:      SpO2: 99% 99% 99% 98%    Final Clinical Impressions(s) / ED Diagnoses   Final diagnoses:  Paroxysmal atrial fibrillation (HCC)  Atrial fibrillation with RVR (HCC)    New Prescriptions New Prescriptions   No medications on file     Pattricia Boss, MD 04/18/16 1341

## 2016-04-18 NOTE — ED Triage Notes (Signed)
Patient complains of feeling as heart racing since 0600 this am. denies CP, no shortness of breath. Has hx of atrial fib and cardioverted 2 weeks ago. Alert and oriented

## 2016-04-18 NOTE — ED Notes (Signed)
Pt did not need anything at this time  

## 2016-04-18 NOTE — Discharge Instructions (Signed)
Please drink plenty of fluids, avoiding soda and [facility]. Call your cardiologist for follow-up. Return if you're worse at any time.

## 2016-04-18 NOTE — ED Notes (Signed)
ED Provider at bedside. 

## 2016-04-18 NOTE — ED Notes (Signed)
Pt family is at bedside 

## 2016-04-21 ENCOUNTER — Ambulatory Visit (INDEPENDENT_AMBULATORY_CARE_PROVIDER_SITE_OTHER): Payer: Medicare Other | Admitting: Cardiology

## 2016-04-21 ENCOUNTER — Encounter: Payer: Self-pay | Admitting: Cardiology

## 2016-04-21 VITALS — BP 132/82 | HR 72 | Ht 66.0 in | Wt 121.4 lb

## 2016-04-21 DIAGNOSIS — G20 Parkinson's disease: Secondary | ICD-10-CM | POA: Diagnosis not present

## 2016-04-21 DIAGNOSIS — Z950 Presence of cardiac pacemaker: Secondary | ICD-10-CM | POA: Diagnosis not present

## 2016-04-21 DIAGNOSIS — Z0389 Encounter for observation for other suspected diseases and conditions ruled out: Secondary | ICD-10-CM

## 2016-04-21 DIAGNOSIS — I480 Paroxysmal atrial fibrillation: Secondary | ICD-10-CM | POA: Diagnosis not present

## 2016-04-21 DIAGNOSIS — IMO0001 Reserved for inherently not codable concepts without codable children: Secondary | ICD-10-CM

## 2016-04-21 DIAGNOSIS — I951 Orthostatic hypotension: Secondary | ICD-10-CM | POA: Diagnosis not present

## 2016-04-21 DIAGNOSIS — E876 Hypokalemia: Secondary | ICD-10-CM | POA: Diagnosis not present

## 2016-04-21 DIAGNOSIS — G20A1 Parkinson's disease without dyskinesia, without mention of fluctuations: Secondary | ICD-10-CM | POA: Insufficient documentation

## 2016-04-21 NOTE — Assessment & Plan Note (Signed)
She has hypotension and this has limited medication use. She also has an aversion to taking any medication.

## 2016-04-21 NOTE — Assessment & Plan Note (Signed)
Followed by Dr Merlene Laughter. Pt thinks sinemet is causing anorexia.

## 2016-04-21 NOTE — Assessment & Plan Note (Signed)
Recurrent PAF with RVR. Seen in ED 04/18/16. She converted before cardioversion could be done.

## 2016-04-21 NOTE — Patient Instructions (Addendum)
Medication Instructions:  Your physician recommends that you continue on your current medications as directed. Please refer to the Current Medication list given to you today.  Labwork: None   Testing/Procedures: None   Follow-Up: Your physician recommends that you schedule a follow-up appointment in: 3-4 months with DR Pilar Plate suggests that you eat a banana once a day or some other kind of food high in potassium  Any Other Special Instructions Will Be Listed Below (If Applicable).  If you need a refill on your cardiac medications before your next appointment, please call your pharmacy.    Potassium Content of Foods Potassium is a mineral found in many foods and drinks. It helps keep fluids and minerals balanced in your body and affects how steadily your heart beats. Potassium also helps control your blood pressure and keep your muscles and nervous system healthy. Certain health conditions and medicines may change the balance of potassium in your body. When this happens, you can help balance your level of potassium through the foods that you do or do not eat. Your health care provider or dietitian may recommend an amount of potassium that you should have each day. The following lists of foods provide the amount of potassium (in parentheses) per serving in each item. High in potassium The following foods and beverages have 200 mg or more of potassium per serving:  Apricots, 2 raw or 5 dry (200 mg).  Artichoke, 1 medium (345 mg).  Avocado, raw,  each (245 mg).  Banana, 1 medium (425 mg).  Beans, lima, or baked beans, canned,  cup (280 mg).  Beans, white, canned,  cup (595 mg).  Beef roast, 3 oz (320 mg).  Beef, ground, 3 oz (270 mg).  Beets, raw or cooked,  cup (260 mg).  Bran muffin, 2 oz (300 mg).  Broccoli,  cup (230 mg).  Brussels sprouts,  cup (250 mg).  Cantaloupe,  cup (215 mg).  Cereal, 100% bran,  cup (200-400 mg).  Cheeseburger, single, fast  food, 1 each (225-400 mg).  Chicken, 3 oz (220 mg).  Clams, canned, 3 oz (535 mg).  Crab, 3 oz (225 mg).  Dates, 5 each (270 mg).  Dried beans and peas,  cup (300-475 mg).  Figs, dried, 2 each (260 mg).  Fish: halibut, tuna, cod, snapper, 3 oz (480 mg).  Fish: salmon, haddock, swordfish, perch, 3 oz (300 mg).  Fish, tuna, canned 3 oz (200 mg).  [place] fries, fast food, 3 oz (470 mg).  Granola with fruit and nuts,  cup (200 mg).  Grapefruit juice,  cup (200 mg).  Greens, beet,  cup (655 mg).  Honeydew melon,  cup (200 mg).  Kale, raw, 1 cup (300 mg).  Kiwi, 1 medium (240 mg).  Kohlrabi, rutabaga, parsnips,  cup (280 mg).  Lentils,  cup (365 mg).  Mango, 1 each (325 mg).  Milk, chocolate, 1 cup (420 mg).  Milk: nonfat, low-fat, whole, buttermilk, 1 cup (350-380 mg).  Molasses, 1 Tbsp (295 mg).  Mushrooms,  cup (280) mg.  Nectarine, 1 each (275 mg).  Nuts: almonds, peanuts, hazelnuts, [place], cashew, mixed, 1 oz (200 mg).  Nuts, pistachios, 1 oz (295 mg).  Orange, 1 each (240 mg).  Orange juice,  cup (235 mg).  Papaya, medium,  fruit (390 mg).  Peanut butter, chunky, 2 Tbsp (240 mg).  Peanut butter, smooth, 2 Tbsp (210 mg).  Pear, 1 medium (200 mg).  Pomegranate, 1 whole (400 mg).  Pomegranate juice,  cup (215  mg).  Pork, 3 oz (350 mg).  Potato chips, salted, 1 oz (465 mg).  Potato, baked with skin, 1 medium (925 mg).  Potatoes, boiled,  cup (255 mg).  Potatoes, mashed,  cup (330 mg).  Prune juice,  cup (370 mg).  Prunes, 5 each (305 mg).  Pudding, chocolate,  cup (230 mg).  Pumpkin, canned,  cup (250 mg).  Raisins, seedless,  cup (270 mg).  Seeds, sunflower or pumpkin, 1 oz (240 mg).  Soy milk, 1 cup (300 mg).  Spinach,  cup (420 mg).  Spinach, canned,  cup (370 mg).  Sweet potato, baked with skin, 1 medium (450 mg).  Swiss chard,  cup (480 mg).  Tomato or vegetable juice,  cup (275 mg).  Tomato  sauce or puree,  cup (400-550 mg).  Tomato, raw, 1 medium (290 mg).  Tomatoes, canned,  cup (200-300 mg).  [place], 3 oz (250 mg).  Wheat germ, 1 oz (250 mg).  Winter squash,  cup (250 mg).  Yogurt, plain or fruited, 6 oz (260-435 mg).  Zucchini,  cup (220 mg). Moderate in potassium The following foods and beverages have 50-200 mg of potassium per serving:  Apple, 1 each (150 mg).  Apple juice,  cup (150 mg).  Applesauce,  cup (90 mg).  Apricot nectar,  cup (140 mg).  Asparagus, small spears,  cup or 6 spears (155 mg).  Bagel, cinnamon raisin, 1 each (130 mg).  Bagel, egg or plain, 4 in., 1 each (70 mg).  Beans, green,  cup (90 mg).  Beans, yellow,  cup (190 mg).  Beer, regular, 12 oz (100 mg).  Beets, canned,  cup (125 mg).  Blackberries,  cup (115 mg).  Blueberries,  cup (60 mg).  Bread, whole wheat, 1 slice (70 mg).  Broccoli, raw,  cup (145 mg).  Cabbage,  cup (150 mg).  Carrots, cooked or raw,  cup (180 mg).  Cauliflower, raw,  cup (150 mg).  Celery, raw,  cup (155 mg).  Cereal, bran flakes, cup (120-150 mg).  Cheese, cottage,  cup (110 mg).  Cherries, 10 each (150 mg).  Chocolate, 1 oz bar (165 mg).  Coffee, brewed 6 oz (90 mg).  Corn,  cup or 1 ear (195 mg).  Cucumbers,  cup (80 mg).  Egg, large, 1 each (60 mg).  Eggplant,  cup (60 mg).  Endive, raw, cup (80 mg).  English muffin, 1 each (65 mg).  Fish, orange roughy, 3 oz (150 mg).  Frankfurter, beef or pork, 1 each (75 mg).  Fruit cocktail,  cup (115 mg).  Grape juice,  cup (170 mg).  Grapefruit,  fruit (175 mg).  Grapes,  cup (155 mg).  Greens: kale, turnip, collard,  cup (110-150 mg).  Ice cream or frozen yogurt, chocolate,  cup (175 mg).  Ice cream or frozen yogurt, vanilla,  cup (120-150 mg).  Lemons, limes, 1 each (80 mg).  Lettuce, all types, 1 cup (100 mg).  Mixed vegetables,  cup (150 mg).  Mushrooms, raw,  cup (110  mg).  Nuts: walnuts, pecans, or macadamia, 1 oz (125 mg).  Oatmeal,  cup (80 mg).  Okra,  cup (110 mg).  Onions, raw,  cup (120 mg).  Peach, 1 each (185 mg).  Peaches, canned,  cup (120 mg).  Pears, canned,  cup (120 mg).  Peas, green, frozen,  cup (90 mg).  Peppers, green,  cup (130 mg).  Peppers, red,  cup (160 mg).  Pineapple juice,  cup (165 mg).  Pineapple,  fresh or canned,  cup (100 mg).  Plums, 1 each (105 mg).  Pudding, vanilla,  cup (150 mg).  Raspberries,  cup (90 mg).  Rhubarb,  cup (115 mg).  Rice, wild,  cup (80 mg).  Shrimp, 3 oz (155 mg).  Spinach, raw, 1 cup (170 mg).  Strawberries,  cup (125 mg).  Summer squash  cup (175-200 mg).  Swiss chard, raw, 1 cup (135 mg).  Tangerines, 1 each (140 mg).  Tea, brewed, 6 oz (65 mg).  Turnips,  cup (140 mg).  Watermelon,  cup (85 mg).  Wine, red, table, 5 oz (180 mg).  Wine, white, table, 5 oz (100 mg). Low in potassium The following foods and beverages have less than 50 mg of potassium per serving.  Bread, white, 1 slice (30 mg).  Carbonated beverages, 12 oz (less than 5 mg).  Cheese, 1 oz (20-30 mg).  Cranberries,  cup (45 mg).  Cranberry juice cocktail,  cup (20 mg).  Fats and oils, 1 Tbsp (less than 5 mg).  Hummus, 1 Tbsp (32 mg).  Nectar: papaya, mango, or pear,  cup (35 mg).  Rice, white or brown,  cup (50 mg).  Spaghetti or macaroni,  cup cooked (30 mg).  Tortilla, flour or corn, 1 each (50 mg).  Waffle, 4 in., 1 each (50 mg).  Water chestnuts,  cup (40 mg). This information is not intended to replace advice given to you by your health care provider. Make sure you discuss any questions you have with your health care provider. Document Released: 08/24/2004 Document Revised: 06/18/2015 Document Reviewed: 12/07/2012 Elsevier Interactive Patient Education  2017 [employer].

## 2016-04-21 NOTE — Assessment & Plan Note (Signed)
Normal coronaries remotely.

## 2016-04-21 NOTE — Progress Notes (Signed)
04/21/2016 Abigail Taylor   05-Dec-1931  [id number]  Primary Physician Glo Herring, MD Primary Cardiologist: Dr Sallyanne Kuster  HPI:  83 y/o frail female followed by Dr Sallyanne Kuster for PAF, SSS, s/p PTVDP. She is on low dose Eliquis. She has not been able to tolerate rate control medications secondary to hypotension (and she has an aversion to taking medications in general). She was in the ED 3/36/18 with AF with RVR. She did take her Diltiazem 30 mg prn as directed. She apparently converted just before she was to be cardioverted. She is in the office today for follow up. She has not had recurrent AF with RVR. I noticed her K+ is usually 3.5 or less and I suggested she take a K+ supplement. She is not interested in taking any new medications.    Current Outpatient Prescriptions  Medication Sig Dispense Refill  . apixaban (ELIQUIS) 2.5 MG TABS tablet Take 1 tablet (2.5 mg total) by mouth 2 (two) times daily. 180 tablet 1  . carbidopa-levodopa (SINEMET IR) 25-100 MG per tablet Take 1 tablet by mouth 3 (three) times daily.     Marland Kitchen diltiazem (CARDIZEM) 30 MG tablet Take 1 tablet (30 mg total) by mouth daily as needed (for rapid palpitations only). 30 tablet 2  . fludrocortisone (FLORINEF) 0.1 MG tablet Take 1 tablet (100 mcg total) by mouth daily. 30 tablet 5  . levothyroxine (SYNTHROID, LEVOTHROID) 50 MCG tablet Take 50 mcg by mouth every evening.     . rosuvastatin (CRESTOR) 10 MG tablet Take 10 mg by mouth at bedtime. Reported on 07/09/2015     No current facility-administered medications for this visit.     No Known Allergies  Past Medical History:  Diagnosis Date  . Angina at rest, with the tachycardia 07/05/2011  . Aortic insufficiency 07/05/2011  . Atrial fibrillation ([place])   . Brady-tachy syndrome ([place])    Dual chamber Medtronic pacemaker Adapta  . Chronic back pain   . Constipation 12/27/2011  . Gait instability   . Hyperlipidemia   . Hypothyroidism   . Normal cardiac stress test  2013  . OA (osteoarthritis)   . Pacemaker   . Parkinson's disease ([place])   . Tremor     Social History   Social History  . Marital status: Married    Spouse name: N/A  . Number of children: N/A  . Years of education: N/A   Occupational History  . Not on file.   Social History Main Topics  . Smoking status: Never Smoker  . Smokeless tobacco: Never Used  . Alcohol use No  . Drug use: No  . Sexual activity: Not on file   Other Topics Concern  . Not on file   Social History Narrative  . No narrative on file     Family History  Problem Relation Age of Onset  . Colon cancer Mother 28  . Heart attack Mother   . Cancer Father      Review of Systems: General: negative for chills, fever, night sweats or weight changes.  Cardiovascular: negative for chest pain, dyspnea on exertion, edema, orthopnea, palpitations, paroxysmal nocturnal dyspnea or shortness of breath Dermatological: negative for rash Respiratory: negative for cough or wheezing Urologic: negative for hematuria Abdominal: negative for nausea, vomiting, diarrhea, bright red blood per rectum, melena, or hematemesis Neurologic: negative for visual changes, syncope, or dizziness All other systems reviewed and are otherwise negative except as noted above.    Blood pressure 132/82, pulse 72,  height 5\' 6"  (1.676 m), weight 121 lb 6.4 oz (55.1 kg).  General appearance: alert, cooperative, no distress and frail Neck: no carotid bruit and no JVD Lungs: clear to auscultation bilaterally Heart: regular rate and rhythm Extremities: no edema Skin: Skin color, texture, turgor normal. No rashes or lesions Neurologic: Grossly normal  EKG Paced  ASSESSMENT AND PLAN:   Paroxysmal atrial fibrillation Recurrent PAF with RVR. Seen in ED 04/18/16. She converted before cardioversion could be done.   Hypotension She has hypotension and this has limited medication use. She also has an aversion to taking any  medication.  Pacemaker Medtronic Adapta dual chamber device model ADDR L1, implanted 07/05/2011.   Parkinson disease ([place]) Followed by Dr Merlene Laughter. Pt thinks sinemet is causing anorexia.   Hypokalemia On reviewing her labs her K+ is always on the low end. It was 3.5 at her recent ED visit  Normal coronary arteries 2001 Normal coronaries remotely.    PLAN  I suggested she eat a banana a day. I wonder if a daily dose of Flecainide might help keep her in rhythm, will discuss with dr Sallyanne Kuster if she has another episode as she is not interested in taking any new medication.   Kerin Ransom PA-C 04/21/2016 9:56 AM

## 2016-04-21 NOTE — Assessment & Plan Note (Signed)
On reviewing her labs her K+ is always on the low end. It was 3.5 at her recent ED visit

## 2016-04-21 NOTE — Assessment & Plan Note (Signed)
Medtronic Adapta dual chamber device model ADDR L1, implanted 07/05/2011.

## 2016-05-02 ENCOUNTER — Other Ambulatory Visit: Payer: Self-pay | Admitting: Cardiovascular Disease

## 2016-05-03 DIAGNOSIS — H52209 Unspecified astigmatism, unspecified eye: Secondary | ICD-10-CM | POA: Diagnosis not present

## 2016-05-03 DIAGNOSIS — H35372 Puckering of macula, left eye: Secondary | ICD-10-CM | POA: Diagnosis not present

## 2016-05-09 DIAGNOSIS — I10 Essential (primary) hypertension: Secondary | ICD-10-CM | POA: Diagnosis not present

## 2016-05-09 DIAGNOSIS — G20 Parkinson's disease: Secondary | ICD-10-CM | POA: Diagnosis not present

## 2016-05-09 DIAGNOSIS — I482 Chronic atrial fibrillation: Secondary | ICD-10-CM | POA: Diagnosis not present

## 2016-05-09 DIAGNOSIS — M542 Cervicalgia: Secondary | ICD-10-CM | POA: Diagnosis not present

## 2016-05-09 DIAGNOSIS — R5381 Other malaise: Secondary | ICD-10-CM | POA: Diagnosis not present

## 2016-05-09 DIAGNOSIS — R471 Dysarthria and anarthria: Secondary | ICD-10-CM | POA: Diagnosis not present

## 2016-05-09 DIAGNOSIS — M533 Sacrococcygeal disorders, not elsewhere classified: Secondary | ICD-10-CM | POA: Diagnosis not present

## 2016-05-09 DIAGNOSIS — E785 Hyperlipidemia, unspecified: Secondary | ICD-10-CM | POA: Diagnosis not present

## 2016-05-09 DIAGNOSIS — M545 Low back pain: Secondary | ICD-10-CM | POA: Diagnosis not present

## 2016-05-09 DIAGNOSIS — Z79899 Other long term (current) drug therapy: Secondary | ICD-10-CM | POA: Diagnosis not present

## 2016-05-09 DIAGNOSIS — E039 Hypothyroidism, unspecified: Secondary | ICD-10-CM | POA: Diagnosis not present

## 2016-05-24 DIAGNOSIS — I739 Peripheral vascular disease, unspecified: Secondary | ICD-10-CM | POA: Diagnosis not present

## 2016-06-01 ENCOUNTER — Ambulatory Visit (INDEPENDENT_AMBULATORY_CARE_PROVIDER_SITE_OTHER): Payer: Medicare Other | Admitting: *Deleted

## 2016-06-01 DIAGNOSIS — I495 Sick sinus syndrome: Secondary | ICD-10-CM | POA: Diagnosis not present

## 2016-06-01 NOTE — Progress Notes (Signed)
Remote pacemaker transmission.   

## 2016-06-02 LAB — CUP PACEART REMOTE DEVICE CHECK
Brady Statistic AP VS Percent: 30 %
Brady Statistic AS VP Percent: 0 %
Date Time Interrogation Session: 20180509152711
Implantable Lead Implant Date: 20130611
Implantable Lead Location: 753859
Implantable Pulse Generator Implant Date: 20130611
Lead Channel Impedance Value: 563 Ohm
Lead Channel Pacing Threshold Amplitude: 0.625 V
Lead Channel Pacing Threshold Pulse Width: 0.4 ms
Lead Channel Setting Sensing Sensitivity: 5.6 mV
MDC IDC LEAD IMPLANT DT: 20130611
MDC IDC LEAD LOCATION: 753860
MDC IDC MSMT BATTERY IMPEDANCE: 280 Ohm
MDC IDC MSMT BATTERY REMAINING LONGEVITY: 131 mo
MDC IDC MSMT BATTERY VOLTAGE: 2.79 V
MDC IDC MSMT LEADCHNL RA IMPEDANCE VALUE: 413 Ohm
MDC IDC MSMT LEADCHNL RA PACING THRESHOLD AMPLITUDE: 0.5 V
MDC IDC MSMT LEADCHNL RA SENSING INTR AMPL: 1.4 mV
MDC IDC MSMT LEADCHNL RV PACING THRESHOLD PULSEWIDTH: 0.4 ms
MDC IDC MSMT LEADCHNL RV SENSING INTR AMPL: 11.2 mV
MDC IDC SET LEADCHNL RA PACING AMPLITUDE: 1.5 V
MDC IDC SET LEADCHNL RV PACING AMPLITUDE: 2 V
MDC IDC SET LEADCHNL RV PACING PULSEWIDTH: 0.4 ms
MDC IDC STAT BRADY AP VP PERCENT: 0 %
MDC IDC STAT BRADY AS VS PERCENT: 70 %

## 2016-06-03 ENCOUNTER — Encounter: Payer: Self-pay | Admitting: Cardiology

## 2016-06-21 DIAGNOSIS — H02104 Unspecified ectropion of left upper eyelid: Secondary | ICD-10-CM | POA: Diagnosis not present

## 2016-06-21 DIAGNOSIS — H04123 Dry eye syndrome of bilateral lacrimal glands: Secondary | ICD-10-CM | POA: Diagnosis not present

## 2016-06-21 DIAGNOSIS — H02101 Unspecified ectropion of right upper eyelid: Secondary | ICD-10-CM | POA: Diagnosis not present

## 2016-06-23 ENCOUNTER — Encounter (HOSPITAL_COMMUNITY): Payer: Self-pay | Admitting: Emergency Medicine

## 2016-06-23 ENCOUNTER — Inpatient Hospital Stay (HOSPITAL_COMMUNITY)
Admission: EM | Admit: 2016-06-23 | Discharge: 2016-06-27 | DRG: 470 | Disposition: A | Discharge status: Skilled Nursing Facility | Payer: Medicare Other | Attending: Internal Medicine | Admitting: Internal Medicine

## 2016-06-23 ENCOUNTER — Emergency Department (HOSPITAL_COMMUNITY): Payer: Medicare Other

## 2016-06-23 DIAGNOSIS — Z471 Aftercare following joint replacement surgery: Secondary | ICD-10-CM | POA: Diagnosis not present

## 2016-06-23 DIAGNOSIS — M25559 Pain in unspecified hip: Secondary | ICD-10-CM | POA: Diagnosis not present

## 2016-06-23 DIAGNOSIS — S72002A Fracture of unspecified part of neck of left femur, initial encounter for closed fracture: Secondary | ICD-10-CM | POA: Diagnosis not present

## 2016-06-23 DIAGNOSIS — Z79899 Other long term (current) drug therapy: Secondary | ICD-10-CM

## 2016-06-23 DIAGNOSIS — X58XXXD Exposure to other specified factors, subsequent encounter: Secondary | ICD-10-CM | POA: Diagnosis not present

## 2016-06-23 DIAGNOSIS — S7292XA Unspecified fracture of left femur, initial encounter for closed fracture: Secondary | ICD-10-CM | POA: Diagnosis not present

## 2016-06-23 DIAGNOSIS — G20A1 Parkinson's disease without dyskinesia, without mention of fluctuations: Secondary | ICD-10-CM | POA: Diagnosis present

## 2016-06-23 DIAGNOSIS — Z960 Presence of urogenital implants: Secondary | ICD-10-CM | POA: Diagnosis not present

## 2016-06-23 DIAGNOSIS — E876 Hypokalemia: Secondary | ICD-10-CM | POA: Diagnosis present

## 2016-06-23 DIAGNOSIS — G8929 Other chronic pain: Secondary | ICD-10-CM | POA: Diagnosis present

## 2016-06-23 DIAGNOSIS — M79605 Pain in left leg: Secondary | ICD-10-CM | POA: Diagnosis not present

## 2016-06-23 DIAGNOSIS — D62 Acute posthemorrhagic anemia: Secondary | ICD-10-CM | POA: Diagnosis not present

## 2016-06-23 DIAGNOSIS — I480 Paroxysmal atrial fibrillation: Secondary | ICD-10-CM | POA: Diagnosis not present

## 2016-06-23 DIAGNOSIS — W06XXXA Fall from bed, initial encounter: Secondary | ICD-10-CM | POA: Diagnosis present

## 2016-06-23 DIAGNOSIS — K5900 Constipation, unspecified: Secondary | ICD-10-CM | POA: Diagnosis not present

## 2016-06-23 DIAGNOSIS — E039 Hypothyroidism, unspecified: Secondary | ICD-10-CM | POA: Diagnosis present

## 2016-06-23 DIAGNOSIS — G20 Parkinson's disease: Secondary | ICD-10-CM | POA: Diagnosis not present

## 2016-06-23 DIAGNOSIS — Z96642 Presence of left artificial hip joint: Secondary | ICD-10-CM | POA: Diagnosis not present

## 2016-06-23 DIAGNOSIS — E785 Hyperlipidemia, unspecified: Secondary | ICD-10-CM | POA: Diagnosis present

## 2016-06-23 DIAGNOSIS — I351 Nonrheumatic aortic (valve) insufficiency: Secondary | ICD-10-CM | POA: Diagnosis present

## 2016-06-23 DIAGNOSIS — Z7901 Long term (current) use of anticoagulants: Secondary | ICD-10-CM | POA: Diagnosis not present

## 2016-06-23 DIAGNOSIS — S72012A Unspecified intracapsular fracture of left femur, initial encounter for closed fracture: Principal | ICD-10-CM

## 2016-06-23 DIAGNOSIS — S72009A Fracture of unspecified part of neck of unspecified femur, initial encounter for closed fracture: Secondary | ICD-10-CM

## 2016-06-23 DIAGNOSIS — I10 Essential (primary) hypertension: Secondary | ICD-10-CM | POA: Diagnosis present

## 2016-06-23 DIAGNOSIS — Z9181 History of falling: Secondary | ICD-10-CM | POA: Diagnosis not present

## 2016-06-23 DIAGNOSIS — M549 Dorsalgia, unspecified: Secondary | ICD-10-CM | POA: Diagnosis present

## 2016-06-23 DIAGNOSIS — I951 Orthostatic hypotension: Secondary | ICD-10-CM | POA: Diagnosis present

## 2016-06-23 DIAGNOSIS — M1990 Unspecified osteoarthritis, unspecified site: Secondary | ICD-10-CM | POA: Diagnosis present

## 2016-06-23 DIAGNOSIS — Z950 Presence of cardiac pacemaker: Secondary | ICD-10-CM | POA: Diagnosis not present

## 2016-06-23 DIAGNOSIS — R279 Unspecified lack of coordination: Secondary | ICD-10-CM | POA: Diagnosis not present

## 2016-06-23 DIAGNOSIS — S72012D Unspecified intracapsular fracture of left femur, subsequent encounter for closed fracture with routine healing: Secondary | ICD-10-CM | POA: Diagnosis not present

## 2016-06-23 DIAGNOSIS — I495 Sick sinus syndrome: Secondary | ICD-10-CM

## 2016-06-23 DIAGNOSIS — G903 Multi-system degeneration of the autonomic nervous system: Secondary | ICD-10-CM

## 2016-06-23 DIAGNOSIS — M6281 Muscle weakness (generalized): Secondary | ICD-10-CM | POA: Diagnosis not present

## 2016-06-23 DIAGNOSIS — R41841 Cognitive communication deficit: Secondary | ICD-10-CM | POA: Diagnosis not present

## 2016-06-23 DIAGNOSIS — R339 Retention of urine, unspecified: Secondary | ICD-10-CM | POA: Diagnosis not present

## 2016-06-23 DIAGNOSIS — S72001A Fracture of unspecified part of neck of right femur, initial encounter for closed fracture: Secondary | ICD-10-CM

## 2016-06-23 DIAGNOSIS — R52 Pain, unspecified: Secondary | ICD-10-CM | POA: Diagnosis not present

## 2016-06-23 DIAGNOSIS — T148XXA Other injury of unspecified body region, initial encounter: Secondary | ICD-10-CM | POA: Diagnosis not present

## 2016-06-23 DIAGNOSIS — G8911 Acute pain due to trauma: Secondary | ICD-10-CM | POA: Diagnosis not present

## 2016-06-23 LAB — CBC WITH DIFFERENTIAL/PLATELET
BASOS ABS: 0 10*3/uL (ref 0.0–0.1)
BASOS PCT: 0 %
EOS PCT: 1 %
Eosinophils Absolute: 0.1 10*3/uL (ref 0.0–0.7)
HCT: 41.7 % (ref 36.0–46.0)
Hemoglobin: 13.8 g/dL (ref 12.0–15.0)
LYMPHS PCT: 10 %
Lymphs Abs: 1.2 10*3/uL (ref 0.7–4.0)
MCH: 28.5 pg (ref 26.0–34.0)
MCHC: 33.1 g/dL (ref 30.0–36.0)
MCV: 86 fL (ref 78.0–100.0)
MONO ABS: 0.7 10*3/uL (ref 0.1–1.0)
MONOS PCT: 6 %
Neutro Abs: 10.2 10*3/uL — ABNORMAL HIGH (ref 1.7–7.7)
Neutrophils Relative %: 83 %
PLATELETS: 171 10*3/uL (ref 150–400)
RBC: 4.85 MIL/uL (ref 3.87–5.11)
RDW: 13.1 % (ref 11.5–15.5)
WBC: 12.1 10*3/uL — ABNORMAL HIGH (ref 4.0–10.5)

## 2016-06-23 LAB — COMPREHENSIVE METABOLIC PANEL
ALT: 10 U/L — ABNORMAL LOW (ref 14–54)
ANION GAP: 6 (ref 5–15)
AST: 18 U/L (ref 15–41)
Albumin: 3.6 g/dL (ref 3.5–5.0)
Alkaline Phosphatase: 58 U/L (ref 38–126)
BUN: 11 mg/dL (ref 6–20)
CHLORIDE: 109 mmol/L (ref 101–111)
CO2: 26 mmol/L (ref 22–32)
Calcium: 8.3 mg/dL — ABNORMAL LOW (ref 8.9–10.3)
Creatinine, Ser: 0.68 mg/dL (ref 0.44–1.00)
GFR calc Af Amer: >60 mL/min (ref >60)
Glucose, Bld: 106 mg/dL — ABNORMAL HIGH (ref 65–99)
POTASSIUM: 3.2 mmol/L — AB (ref 3.5–5.1)
Sodium: 141 mmol/L (ref 135–145)
TOTAL PROTEIN: 5.5 g/dL — AB (ref 6.5–8.1)
Total Bilirubin: 0.8 mg/dL (ref 0.3–1.2)

## 2016-06-23 LAB — I-STAT CHEM 8, ED
BUN: 15 mg/dL (ref 6–20)
CHLORIDE: 105 mmol/L (ref 101–111)
Calcium, Ion: 1.11 mmol/L — ABNORMAL LOW (ref 1.15–1.40)
Creatinine, Ser: 0.6 mg/dL (ref 0.44–1.00)
GLUCOSE: 101 mg/dL — AB (ref 65–99)
HEMATOCRIT: 41 % (ref 36.0–46.0)
HEMOGLOBIN: 13.9 g/dL (ref 12.0–15.0)
POTASSIUM: 3.2 mmol/L — AB (ref 3.5–5.1)
SODIUM: 143 mmol/L (ref 135–145)
TCO2: 28 mmol/L (ref 0–100)

## 2016-06-23 LAB — ABO/RH: ABO/RH(D): O POS

## 2016-06-23 MED ORDER — LEVOTHYROXINE SODIUM 50 MCG PO TABS
50.0000 ug | ORAL_TABLET | Freq: Every evening | ORAL | Status: DC
  Administered 2016-06-24 – 2016-06-27 (×4): 50 ug via ORAL
  Filled 2016-06-23 (×4): qty 1

## 2016-06-23 MED ORDER — HYPROMELLOSE (GONIOSCOPIC) 2.5 % OP SOLN
1.0000 [drp] | OPHTHALMIC | Status: DC | PRN
Start: 2016-06-23 — End: 2016-06-23

## 2016-06-23 MED ORDER — LIFITEGRAST 5 % OP SOLN: 1.0000 [drp] | Freq: Every evening | OPHTHALMIC | Status: DC

## 2016-06-23 MED ORDER — SODIUM CHLORIDE 0.9 % IV BOLUS (SEPSIS)
500.0000 mL | Freq: Once | INTRAVENOUS | Status: AC
  Administered 2016-06-23: 500 mL via INTRAVENOUS

## 2016-06-23 MED ORDER — CARBIDOPA-LEVODOPA 25-100 MG PO TABS
1.0000 | ORAL_TABLET | Freq: Three times a day (TID) | ORAL | Status: DC
  Administered 2016-06-23 – 2016-06-27 (×11): 1 via ORAL
  Filled 2016-06-23 (×12): qty 1

## 2016-06-23 MED ORDER — SENNOSIDES-DOCUSATE SODIUM 8.6-50 MG PO TABS
1.0000 | ORAL_TABLET | Freq: Every day | ORAL | Status: DC
  Administered 2016-06-23 – 2016-06-26 (×4): 1 via ORAL
  Filled 2016-06-23 (×4): qty 1

## 2016-06-23 MED ORDER — ONDANSETRON HCL 4 MG PO TABS: 4.0000 mg | ORAL_TABLET | Freq: Four times a day (QID) | ORAL | Status: DC | PRN

## 2016-06-23 MED ORDER — HYDROMORPHONE HCL 1 MG/ML IJ SOLN
0.5000 mg | INTRAMUSCULAR | Status: DC | PRN
  Administered 2016-06-23 – 2016-06-24 (×4): 0.5 mg via INTRAVENOUS
  Filled 2016-06-23 (×4): qty 1

## 2016-06-23 MED ORDER — SENNOSIDES-DOCUSATE SODIUM 8.6-50 MG PO TABS: 1.0000 | ORAL_TABLET | Freq: Every evening | ORAL | Status: DC | PRN

## 2016-06-23 MED ORDER — ONDANSETRON HCL 4 MG/2ML IJ SOLN
4.0000 mg | Freq: Once | INTRAMUSCULAR | Status: AC
  Administered 2016-06-23: 4 mg via INTRAVENOUS
  Filled 2016-06-23: qty 2

## 2016-06-23 MED ORDER — ACETAMINOPHEN 650 MG RE SUPP: 650.0000 mg | Freq: Four times a day (QID) | RECTAL | Status: DC | PRN

## 2016-06-23 MED ORDER — HEPARIN SODIUM (PORCINE) 5000 UNIT/ML IJ SOLN
5000.0000 [IU] | Freq: Three times a day (TID) | INTRAMUSCULAR | Status: DC
  Administered 2016-06-23 – 2016-06-24 (×2): 5000 [IU] via SUBCUTANEOUS
  Filled 2016-06-23 (×2): qty 1

## 2016-06-23 MED ORDER — ROSUVASTATIN CALCIUM 10 MG PO TABS
10.0000 mg | ORAL_TABLET | Freq: Every day | ORAL | Status: DC
  Administered 2016-06-23 – 2016-06-26 (×4): 10 mg via ORAL
  Filled 2016-06-23 (×4): qty 1

## 2016-06-23 MED ORDER — OXYCODONE-ACETAMINOPHEN 5-325 MG PO TABS
1.0000 | ORAL_TABLET | Freq: Four times a day (QID) | ORAL | Status: DC | PRN
  Administered 2016-06-23 – 2016-06-25 (×3): 1 via ORAL
  Filled 2016-06-23 (×3): qty 1

## 2016-06-23 MED ORDER — HYDROMORPHONE HCL 1 MG/ML IJ SOLN
0.5000 mg | Freq: Once | INTRAMUSCULAR | Status: AC
  Administered 2016-06-23: 0.5 mg via INTRAVENOUS
  Filled 2016-06-23: qty 1

## 2016-06-23 MED ORDER — POTASSIUM CHLORIDE CRYS ER 20 MEQ PO TBCR
40.0000 meq | EXTENDED_RELEASE_TABLET | Freq: Once | ORAL | Status: AC
  Administered 2016-06-23: 40 meq via ORAL
  Filled 2016-06-23: qty 2

## 2016-06-23 MED ORDER — ACETAMINOPHEN 325 MG PO TABS: 650.0000 mg | ORAL_TABLET | Freq: Four times a day (QID) | ORAL | Status: DC | PRN

## 2016-06-23 MED ORDER — POLYVINYL ALCOHOL 1.4 % OP SOLN
1.0000 [drp] | OPHTHALMIC | Status: DC | PRN
  Filled 2016-06-23: qty 15

## 2016-06-23 MED ORDER — ONDANSETRON HCL 4 MG/2ML IJ SOLN
4.0000 mg | Freq: Four times a day (QID) | INTRAMUSCULAR | Status: DC | PRN
  Administered 2016-06-23 – 2016-06-24 (×3): 4 mg via INTRAVENOUS
  Filled 2016-06-23 (×3): qty 2

## 2016-06-23 NOTE — ED Provider Notes (Signed)
[facility] DEPT Provider Note   CSN: [id number] Arrival date & time: 06/23/16  9381     History   Chief Complaint Chief Complaint  Patient presents with  . Fall  . Hip Pain    HPI Abigail Taylor is a 83 y.o. female.  Patient fell out of bed when she was asleep. Patient complains of right hip pain   The history is provided by the patient. No language interpreter was used.  Fall  This is a new problem. The current episode started 6 to 12 hours ago. The problem occurs rarely. The problem has been resolved. Pertinent negatives include no chest pain, no abdominal pain and no headaches. Nothing aggravates the symptoms. Nothing relieves the symptoms.  Hip Pain  Pertinent negatives include no chest pain, no abdominal pain and no headaches.    Past Medical History:  Diagnosis Date  . Angina at rest, with the tachycardia 07/05/2011  . Aortic insufficiency 07/05/2011  . Atrial fibrillation ([place])   . Brady-tachy syndrome ([place])    Dual chamber Medtronic pacemaker Adapta  . Chronic back pain   . Constipation 12/27/2011  . Gait instability   . Hyperlipidemia   . Hypothyroidism   . Normal cardiac stress test 2013  . OA (osteoarthritis)   . Pacemaker   . Parkinson's disease ([place])   . Tremor     Patient Active Problem List   Diagnosis Date Noted  . Parkinson disease ([place]) 04/21/2016  . Hypokalemia 04/21/2016  . Hypotension 01/16/2015  . Normal coronary arteries 2001 05/08/2014  . Pacemaker 08/10/2012  . Aortic insufficiency 08/10/2012  . Constipation 12/27/2011  . Family hx of colon cancer 12/27/2011  . Hypothyroidism 07/05/2011  . Paroxysmal atrial fibrillation ([place]) 07/05/2011  . Sinus pause, 7 seconds post conversion, S/P MDT pacemaker 07/05/11 07/05/2011  . Dyslipidemia 07/05/2011  . Angina at rest, with rapid AF 07/05/2011    Past Surgical History:  Procedure Laterality Date  . ABDOMINAL HYSTERECTOMY    . CARDIAC CATHETERIZATION  05/26/1999   normal  . CARDIAC  CATHETERIZATION  2001   normal coronary arteries  . COLONOSCOPY N/A 11/15/2012   Procedure: COLONOSCOPY;  Surgeon: [name], MD;  [facility];  Service: Endoscopy;  Laterality: N/A;  1030  . INSERT / REPLACE / REMOVE PACEMAKER    . NM MYOVIEW LTD  07/19/2011   normal  . PERMANENT PACEMAKER INSERTION  07/05/2011   Medtronic Adapta dual chamber  . PERMANENT PACEMAKER INSERTION N/A 07/05/2011   Procedure: PERMANENT PACEMAKER INSERTION;  Surgeon: [name], MD;  Location: [facility] CATH LAB;  Service: Cardiovascular;  Laterality: N/A;    OB History    No data available       Home Medications    Prior to Admission medications   Medication Sig Start Date End Date Taking? Authorizing Provider  carbidopa-levodopa (SINEMET IR) 25-100 MG per tablet Take 1 tablet by mouth 3 (three) times daily.    Yes [provider]  diltiazem (CARDIZEM) 30 MG tablet Take 1 tablet (30 mg total) by mouth daily as needed (for rapid palpitations only). 09/02/15  Yes Croitoru, Mihai, MD  ELIQUIS 2.5 MG TABS tablet TAKE (1) TABLET BY MOUTH TWICE DAILY. 05/02/16  Yes Croitoru, Mihai, MD  fludrocortisone (FLORINEF) 0.1 MG tablet Take 1 tablet (100 mcg total) by mouth daily. 04/05/16  Yes Croitoru, Mihai, MD  hydroxypropyl methylcellulose / hypromellose (ISOPTO TEARS / GONIOVISC) 2.5 % ophthalmic solution Place 1 drop into both eyes as needed for  dry eyes (uses ointment).   Yes [provider]  levothyroxine (SYNTHROID, LEVOTHROID) 50 MCG tablet Take 50 mcg by mouth every evening.    Yes [provider]  Lifitegrast Shirley Friar) 5 % SOLN Apply 1 drop to eye every evening. Both eyes   Yes [provider]  rosuvastatin (CRESTOR) 10 MG tablet Take 10 mg by mouth at bedtime. Reported on 07/09/2015   Yes [provider]    Family History Family History  Problem Relation Age of Onset  . Colon cancer Mother 28  . Heart attack Mother   . Cancer Father     Social  History Social History  Substance Use Topics  . Smoking status: Never Smoker  . Smokeless tobacco: Never Used  . Alcohol use No     Allergies   Patient has no known allergies.   Review of Systems Review of Systems  Constitutional: Negative for appetite change and fatigue.  HENT: Negative for congestion, ear discharge and sinus pressure.   Eyes: Negative for discharge.  Respiratory: Negative for cough.   Cardiovascular: Negative for chest pain.  Gastrointestinal: Negative for abdominal pain and diarrhea.  Genitourinary: Negative for frequency and hematuria.  Musculoskeletal: Negative for back pain.       Pain right hip  Skin: Negative for rash.  Neurological: Negative for seizures and headaches.  Psychiatric/Behavioral: Negative for hallucinations.     Physical Exam Updated Vital Signs BP 121/81   Pulse 80   Temp 98.4 F (36.9 C) (Oral)   Resp (!) 9   Ht 5\' 6"  (1.676 m)   Wt 59 kg (130 lb)   SpO2 96%   BMI 20.98 kg/m   Physical Exam  Constitutional: She is oriented to person, place, and time. She appears well-developed.  HENT:  Head: Normocephalic.  Eyes: Conjunctivae and EOM are normal. No scleral icterus.  Neck: Neck supple. No thyromegaly present.  Cardiovascular: Normal rate and regular rhythm.  Exam reveals no gallop and no friction rub.   No murmur heard. Pulmonary/Chest: No stridor. She has no wheezes. She has no rales. She exhibits no tenderness.  Abdominal: She exhibits no distension. There is no tenderness. There is no rebound.  Musculoskeletal: She exhibits no edema.  Tender right hip neurovascular exam normal  Lymphadenopathy:    She has no cervical adenopathy.  Neurological: She is oriented to person, place, and time. She exhibits normal muscle tone. Coordination normal.  Skin: No rash noted. No erythema.  Psychiatric: She has a normal mood and affect. Her behavior is normal.     ED Treatments / Results  Labs (all labs ordered are listed,  but only abnormal results are displayed) Labs Reviewed  CBC WITH DIFFERENTIAL/PLATELET - Abnormal; Notable for the following:       Result Value   WBC 12.1 (*)    Neutro Abs 10.2 (*)    All other components within normal limits  COMPREHENSIVE METABOLIC PANEL - Abnormal; Notable for the following:    Potassium 3.2 (*)    Glucose, Bld 106 (*)    Calcium 8.3 (*)    Total Protein 5.5 (*)    ALT 10 (*)    All other components within normal limits  I-STAT CHEM 8, ED - Abnormal; Notable for the following:    Potassium 3.2 (*)    Glucose, Bld 101 (*)    Calcium, Ion 1.11 (*)    All other components within normal limits  TYPE AND SCREEN    EKG  EKG  Interpretation None       Radiology Dg Hip Unilat With Pelvis 2-3 Views Left  Result Date: 06/23/2016 CLINICAL DATA:  Patient fell off bed.  LEFT hip pain. EXAM: DG HIP (WITH OR WITHOUT PELVIS) 2-3V LEFT COMPARISON:  None. FINDINGS: There is an acute subcapital LEFT hip fracture with angulation. Upward displacement of the femoral shaft on the femoral neck, with rotation. Osteopenia. Lumbar spondylosis. IMPRESSION: Acute Subcapital LEFT hip fracture with angulation and foreshortening of the leg. Electronically Signed   By: Staci Righter M.D.   On: 06/23/2016 10:41    Procedures Procedures (including critical care time)  Medications Ordered in ED Medications  potassium chloride SA (K-DUR,KLOR-CON) CR tablet 40 mEq (not administered)  sodium chloride 0.9 % bolus 500 mL (500 mLs Intravenous New Bag/Given 06/23/16 1058)  HYDROmorphone (DILAUDID) injection 0.5 mg (0.5 mg Intravenous Given 06/23/16 1216)  ondansetron (ZOFRAN) injection 4 mg (4 mg Intravenous Given 06/23/16 1216)     Initial Impression / Assessment and Plan / ED Course  I have reviewed the triage vital signs and the nursing notes.  Pertinent labs & imaging results that were available during my care of the patient were reviewed by me and considered in my medical decision  making (see chart for details).     Patient with right hip fracture. She will be admitted by medicine with orthopedic consult  Final Clinical Impressions(s) / ED Diagnoses   Final diagnoses:  Closed fracture of right hip, initial encounter [facility])    New Prescriptions New Prescriptions   No medications on file     Milton Ferguson, MD 06/23/16 1222

## 2016-06-23 NOTE — H&P (Signed)
Date: 06/23/2016               Patient Name:  Abigail Taylor MRN: [id number]  DOB: 21-Dec-1931 Age / Sex: 83 y.o., female   PCP: [name], MD         Medical Service: Internal Medicine Teaching Service         Attending Physician: Dr. Lucious Groves    First Contact: Dr. Alphonzo Grieve Pager: [phone]  Second Contact: Dr. Jule Ser Pager: [phone]       After Hours (After 5p/  First Contact Pager: [phone]  weekends / holidays): Second Contact Pager: [phone]   Chief Complaint: Fall, left hip pain  History of Present Illness:  This is an 83 y/o F with Mhx significant for atrial fibrillation on Eliquis and diltiazem prn, parkinson's disease on Sinemet, tachy-brady syndrome s/p dual-chamber pacer placement, hypothyroidism and hyperlipidiemia who presents for evaluation of left hip pain after falling out of bed while sleeping. Reports she was turning over in bed and "apparently there wasn't anything to turn on to." She did not hit her head and denies LOC. She denies any prior history of similar events and notes she has been in her usual state of health for the past several weeks. She denies any fevers, chills, nausea, vomiting, chest pain, shortness of breath, cough, abdominal pain, diarrhea, dysuria or increased frequency. Her last dose of eliquis yesterday.   In the ED wnl except for hypertension at 160/72 which resolved with pain control in ED. CMET with K of 3.2. CBC with WBC of 12.1 but was otherwise wnl. Plain films of left hip with acute subcapital left hip fracture with angulation and foreshortening of leg. Orthopedics was consulted in the ED, and IMTS was asked to admit.   Meds:  Current Meds  Medication Sig  . carbidopa-levodopa (SINEMET IR) 25-100 MG per tablet Take 1 tablet by mouth 3 (three) times daily.   Marland Kitchen diltiazem (CARDIZEM) 30 MG tablet Take 1 tablet (30 mg total) by mouth daily as needed (for rapid palpitations only).  Marland Kitchen ELIQUIS 2.5 MG TABS tablet TAKE (1)  TABLET BY MOUTH TWICE DAILY.  . fludrocortisone (FLORINEF) 0.1 MG tablet Take 1 tablet (100 mcg total) by mouth daily.  . hydroxypropyl methylcellulose / hypromellose (ISOPTO TEARS / GONIOVISC) 2.5 % ophthalmic solution Place 1 drop into both eyes as needed for dry eyes (uses ointment).  Marland Kitchen levothyroxine (SYNTHROID, LEVOTHROID) 50 MCG tablet Take 50 mcg by mouth every evening.   Marland Kitchen Lifitegrast (XIIDRA) 5 % SOLN Apply 1 drop to eye every evening. Both eyes  . rosuvastatin (CRESTOR) 10 MG tablet Take 10 mg by mouth at bedtime. Reported on 07/09/2015   Allergies: Allergies as of 06/23/2016  . (No Known Allergies)   Past Medical History:  Diagnosis Date  . Angina at rest, with the tachycardia 07/05/2011  . Aortic insufficiency 07/05/2011  . Atrial fibrillation ([place])   . Brady-tachy syndrome ([place])    Dual chamber Medtronic pacemaker Adapta  . Chronic back pain   . Constipation 12/27/2011  . Gait instability   . Hyperlipidemia   . Hypothyroidism   . Normal cardiac stress test 2013  . OA (osteoarthritis)   . Pacemaker   . Parkinson's disease ([place])   . Tremor    Family History: Both parents are deceased. Mother with history of MI and colon cancer. Father deceased due to unspecified cancer. She has 1 brother who is still alive and has 1 daughter and  1 son.   Social History: She has never smoked, drank or used recreational drugs. She lives at home with her husband. She is independent with ADLs and manages her own medications. 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   Review of Systems: A complete ROS was negative except as per HPI.   Physical Exam: Blood pressure 121/81, pulse 80, temperature 98.4 F (36.9 C), temperature source Oral, resp. rate (!) 9, height 5\' 6"  (1.676 m), weight 130 lb (59 kg), SpO2 96 %. General: Pleasant, elderly caucasian woman resting comfortably in bed. In no acute distress. Tired. HENT: PERRL but small. EOMI. No conjunctival injection, icterus or  ptosis. Oropharynx clear, mucous membranes moist. Bruising on left face. Cardiovascular: IRIR. No murmur or rub appreciated. Pulmonary: CTA BL, no wheezing, crackles or rhonchi appreciated. Unlabored breathing.  Abdomen: Soft, non-tender and non-distended. +bowel sounds.  Extremities: No peripheral edema noted BL. Intact distal pulses. LLE externally rotated and shorter than right.  Skin: Warm, dry. No cyanosis, suspicious rashes or lesions.  Neuro: Alert and oriented x3. Examination limited by pain/fracture but strength and sensation grossly intact.  Psych: Mood normal and affect was mood congruent. Responds to questions appropriately.   EKG: Atrial paced rhythm with rate of 78.   Left Hip X-ray: Acute subcapital left hip fracture with angulation and foreshortening of the leg.   Left Knee X-ray: pending  Assessment & Plan by Problem: Principal Problem:   Closed subcapital fracture of left femur (HCC) Active Problems:   Paroxysmal atrial fibrillation (HCC)   Parkinson disease (HCC)   Hypokalemia   Tachy-brady syndrome (HCC)   Orthostatic hypotension due to Parkinson's disease ([place])  Acute Left Subcondylar Fracture with Angulation and Foreshortening: following accidental fall from bed this morning. Seen by ortho who prefer eliquis wash-out prior to procedure. She appears comfortable after dilaudid 0.5 mg from EDP. -THA vs hemi-arthroplasty per ortho -Allowing for Eliquis wash out -Pain control with IV dilaudid 0.5mg  Q4H prn severe pain -Knee x-ray pending  Paroxysmal Atrial Fibrillation: on Eliquis 2.5mg  BID and diltiazem 30 mg PRN RVR.  -HOLD eliquis  Tachy-Brady Syndrome s/p dual-chamber permanent pacer placement, Normal Coronary Arteries: Follows with Dr. Sallyanne Kuster of [facility], no changes made at last visit 2 months ago. Remote pacer check 06/01/16 appropriate. Rare AFib noted but no RVR. She is not pacer dependent.   Chronic Hypokalemia: Appreciated at her last cardiologist  appointment in March. Patient continues to refuse daily oral supplementation in the outpatient setting. K 3.2 on admission, will replace.  -K-Dur 79mEq x1 -Repeat BMET in AM  Orthostatic Hypotension: Hypertensive on arrival however BP has normalized. On Florinef intermittently at home, hasnt taken in ~1 month. She refuses compression hose.   Hypothyroidism: On Synthroid 50 mcg daily. Most recent TSH in our system 3.2 12/2014.  -Continue Synthroid  HLD: On crestor 10mg  daily. Continued.  IVF: S/p 500cc bolus in ED Diet: HH DVT PPx: Heparin Code Status: FULL - this was discussed with the patient at bedside.  Consults: Orthopedics   Dispo: Admit patient to Inpatient with expected length of stay greater than 2 midnights.  SignedEinar Gip, DO 06/23/2016, 12:45 PM  Pager: [phone]

## 2016-06-23 NOTE — Consult Note (Signed)
Reason for Consult:Hip fx Referring Physician: [name] is an 83 y.o. female.  HPI: Abigail Taylor was in her usual state of health. She was sleeping this morning and rolled out of bed onto the floor. She had immediate left hip pain. She tried to get up but was unable. She was [facility] to [facility] via EMS. X-rays showed a left subcapital femur fx and orthopedic surgery was consulted.  Past Medical History:  Diagnosis Date  . Angina at rest, with the tachycardia 07/05/2011  . Aortic insufficiency 07/05/2011  . Atrial fibrillation ([place])   . Brady-tachy syndrome ([place])    Dual chamber Medtronic pacemaker Adapta  . Chronic back pain   . Constipation 12/27/2011  . Gait instability   . Hyperlipidemia   . Hypothyroidism   . Normal cardiac stress test 2013  . OA (osteoarthritis)   . Pacemaker   . Parkinson's disease ([place])   . Tremor     Past Surgical History:  Procedure Laterality Date  . ABDOMINAL HYSTERECTOMY    . CARDIAC CATHETERIZATION  05/26/1999   normal  . CARDIAC CATHETERIZATION  2001   normal coronary arteries  . COLONOSCOPY N/A 11/15/2012   Procedure: COLONOSCOPY;  Surgeon: [name], MD;  [facility];  Service: Endoscopy;  Laterality: N/A;  1030  . INSERT / REPLACE / REMOVE PACEMAKER    . NM MYOVIEW LTD  07/19/2011   normal  . PERMANENT PACEMAKER INSERTION  07/05/2011   Medtronic Adapta dual chamber  . PERMANENT PACEMAKER INSERTION N/A 07/05/2011   Procedure: PERMANENT PACEMAKER INSERTION;  Surgeon: [name], MD;  Location: [facility] CATH LAB;  Service: Cardiovascular;  Laterality: N/A;    Family History  Problem Relation Age of Onset  . Colon cancer Mother 12  . Heart attack Mother   . Cancer Father     Social History:  reports that she has never smoked. She has never used smokeless tobacco. She reports that she does not drink alcohol or use drugs.  Allergies: No Known Allergies  Medications: I have reviewed the patient's current  medications.  Results for orders placed or performed during the hospital encounter of 06/23/16 (from the past 48 hour(s))  Type and screen     Status: None   Collection Time: 06/23/16 11:38 AM  Result Value Ref Range   ABO/RH(D) O POS    Antibody Screen NEG    Sample Expiration 06/26/2016   CBC with Differential/Platelet     Status: Abnormal   Collection Time: 06/23/16 11:44 AM  Result Value Ref Range   WBC 12.1 (H) 4.0 - 10.5 K/uL   RBC 4.85 3.87 - 5.11 MIL/uL   Hemoglobin 13.8 12.0 - 15.0 g/dL   HCT 41.7 36.0 - 46.0 %   MCV 86.0 78.0 - 100.0 fL   MCH 28.5 26.0 - 34.0 pg   MCHC 33.1 30.0 - 36.0 g/dL   RDW 13.1 11.5 - 15.5 %   Platelets 171 150 - 400 K/uL   Neutrophils Relative % 83 %   Neutro Abs 10.2 (H) 1.7 - 7.7 K/uL   Lymphocytes Relative 10 %   Lymphs Abs 1.2 0.7 - 4.0 K/uL   Monocytes Relative 6 %   Monocytes Absolute 0.7 0.1 - 1.0 K/uL   Eosinophils Relative 1 %   Eosinophils Absolute 0.1 0.0 - 0.7 K/uL   Basophils Relative 0 %   Basophils Absolute 0.0 0.0 - 0.1 K/uL  Comprehensive metabolic panel     Status:  Abnormal   Collection Time: 06/23/16 11:44 AM  Result Value Ref Range   Sodium 141 135 - 145 mmol/L   Potassium 3.2 (L) 3.5 - 5.1 mmol/L   Chloride 109 101 - 111 mmol/L   CO2 26 22 - 32 mmol/L   Glucose, Bld 106 (H) 65 - 99 mg/dL   BUN 11 6 - 20 mg/dL   Creatinine, Ser 0.68 0.44 - 1.00 mg/dL   Calcium 8.3 (L) 8.9 - 10.3 mg/dL   Total Protein 5.5 (L) 6.5 - 8.1 g/dL   Albumin 3.6 3.5 - 5.0 g/dL   AST 18 15 - 41 U/L   ALT 10 (L) 14 - 54 U/L   Alkaline Phosphatase 58 38 - 126 U/L   Total Bilirubin 0.8 0.3 - 1.2 mg/dL   GFR calc non Af Amer >60 >60 mL/min   GFR calc Af Amer >60 >60 mL/min    Comment: (NOTE) The eGFR has been calculated using the CKD EPI equation. This calculation has not been validated in all clinical situations. eGFR's persistently <60 mL/min signify possible Chronic Kidney Disease.    Anion gap 6 5 - 15  I-stat chem 8, ed     Status:  Abnormal   Collection Time: 06/23/16 12:00 PM  Result Value Ref Range   Sodium 143 135 - 145 mmol/L   Potassium 3.2 (L) 3.5 - 5.1 mmol/L   Chloride 105 101 - 111 mmol/L   BUN 15 6 - 20 mg/dL   Creatinine, Ser 0.60 0.44 - 1.00 mg/dL   Glucose, Bld 101 (H) 65 - 99 mg/dL   Calcium, Ion 1.11 (L) 1.15 - 1.40 mmol/L   TCO2 28 0 - 100 mmol/L   Hemoglobin 13.9 12.0 - 15.0 g/dL   HCT 41.0 36.0 - 46.0 %    Dg Hip Unilat With Pelvis 2-3 Views Left  Result Date: 06/23/2016 CLINICAL DATA:  Patient fell off bed.  LEFT hip pain. EXAM: DG HIP (WITH OR WITHOUT PELVIS) 2-3V LEFT COMPARISON:  None. FINDINGS: There is an acute subcapital LEFT hip fracture with angulation. Upward displacement of the femoral shaft on the femoral neck, with rotation. Osteopenia. Lumbar spondylosis. IMPRESSION: Acute Subcapital LEFT hip fracture with angulation and foreshortening of the leg. Electronically Signed   By: Staci Righter M.D.   On: 06/23/2016 10:41    Review of Systems  Constitutional: Negative for weight loss.  HENT: Negative for ear discharge, ear pain, hearing loss and tinnitus.   Eyes: Negative for blurred vision, double vision, photophobia and pain.  Respiratory: Negative for cough, sputum production and shortness of breath.   Cardiovascular: Negative for chest pain.  Gastrointestinal: Negative for abdominal pain, nausea and vomiting.  Genitourinary: Negative for dysuria, flank pain, frequency and urgency.  Musculoskeletal: Positive for joint pain (Left hip). Negative for back pain, falls, myalgias and neck pain.  Neurological: Negative for dizziness, tingling, sensory change, focal weakness, loss of consciousness and headaches.  Endo/Heme/Allergies: Does not bruise/bleed easily.  Psychiatric/Behavioral: Negative for depression, memory loss and substance abuse. The patient is not nervous/anxious.    Blood pressure 121/81, pulse 80, temperature 98.4 F (36.9 C), temperature source Oral, resp. rate (!) 9,  height _0  (1.676 m), weight 59 kg (130 lb), SpO2 96 %. Physical Exam  Constitutional: She appears well-developed and well-nourished. No distress.  HENT:  Head: Normocephalic.  Eyes: Conjunctivae are normal. Right eye exhibits no discharge. Left eye exhibits no discharge. No scleral icterus.  Cardiovascular: Normal rate and regular rhythm.  Respiratory: Effort normal. No respiratory distress.  Musculoskeletal:  Bilateral shoulder, elbow, wrist, digits- no skin wounds, nontender, no instability, no blocks to motion  Sens  Ax/R/M/U intact  Mot   Ax/ R/ PIN/ M/ AIN/ U intact  Rad 2+  RLE No traumatic wounds, ecchymosis, or rash  Nontender  No effusions  Knee stable to varus/ valgus and anterior/posterior stress  Sens DPN, SPN, TN intact  Motor EHL, ext, flex, evers 5/5  DP 2+, PT 2+, No significant edema   LLE No traumatic wounds, ecchymosis, or rash  TTP hip  No effusions  Knee stable to varus/ valgus and anterior/posterior stress  Sens DPN, SPN, TN intact  Motor EHL, ext, flex, evers 5/5  DP 2+, PT 2+, No significant edema  Skin: She is not diaphoretic.    Assessment/Plan: Fall Left subcapital femur fx -- Will need THA vs hemiarthroplasty. Will need to wait a few days for the Elequis to exit her system. Dr. Marcelino Scot to evaluate but suspect colleague will perform procedure. Anticoagulation on Eliquis -- Hold until ok'd by ortho, sometime after surgery Multiple medical problems -- IM to admit    Lisette Abu, PA-C Orthopedic Surgery [phone] 06/23/2016, 12:42 PM

## 2016-06-23 NOTE — ED Notes (Signed)
Assisted pt on to fracture pan. Peri care performed.

## 2016-06-23 NOTE — ED Triage Notes (Signed)
Arrived via EMS. Patient rolled out of bed landed on left side. Pain left side of faced denies LOC alert answering and following commands appropriate Has left hip pain with shortening and rotation. EMS administered Morphine 10 mg IVP.

## 2016-06-24 ENCOUNTER — Other Ambulatory Visit (HOSPITAL_COMMUNITY): Payer: Medicare Other

## 2016-06-24 ENCOUNTER — Inpatient Hospital Stay (HOSPITAL_COMMUNITY): Payer: Medicare Other | Admitting: Certified Registered Nurse Anesthetist

## 2016-06-24 ENCOUNTER — Encounter (HOSPITAL_COMMUNITY)
Admission: EM | Disposition: A | Discharge status: Skilled Nursing Facility | Payer: Self-pay | Source: Home / Self Care | Attending: Internal Medicine

## 2016-06-24 ENCOUNTER — Inpatient Hospital Stay (HOSPITAL_COMMUNITY): Payer: Medicare Other

## 2016-06-24 DIAGNOSIS — I480 Paroxysmal atrial fibrillation: Secondary | ICD-10-CM

## 2016-06-24 DIAGNOSIS — S72012A Unspecified intracapsular fracture of left femur, initial encounter for closed fracture: Principal | ICD-10-CM

## 2016-06-24 DIAGNOSIS — I495 Sick sinus syndrome: Secondary | ICD-10-CM

## 2016-06-24 DIAGNOSIS — E876 Hypokalemia: Secondary | ICD-10-CM

## 2016-06-24 HISTORY — PX: HIP ARTHROPLASTY: SHX981

## 2016-06-24 LAB — BASIC METABOLIC PANEL
ANION GAP: 6 (ref 5–15)
BUN: 9 mg/dL (ref 6–20)
CHLORIDE: 106 mmol/L (ref 101–111)
CO2: 26 mmol/L (ref 22–32)
Calcium: 8.6 mg/dL — ABNORMAL LOW (ref 8.9–10.3)
Creatinine, Ser: 0.61 mg/dL (ref 0.44–1.00)
GFR calc non Af Amer: >60 mL/min (ref >60)
GLUCOSE: 116 mg/dL — AB (ref 65–99)
POTASSIUM: 4.1 mmol/L (ref 3.5–5.1)
Sodium: 138 mmol/L (ref 135–145)

## 2016-06-24 LAB — URINALYSIS, ROUTINE W REFLEX MICROSCOPIC
BACTERIA UA: NONE SEEN
BILIRUBIN URINE: NEGATIVE
Glucose, UA: NEGATIVE mg/dL
KETONES UR: 20 mg/dL — AB
LEUKOCYTES UA: NEGATIVE
NITRITE: NEGATIVE
PH: 5 (ref 5.0–8.0)
Protein, ur: NEGATIVE mg/dL
SPECIFIC GRAVITY, URINE: 1.02 (ref 1.005–1.030)

## 2016-06-24 LAB — CBC
HEMATOCRIT: 41.9 % (ref 36.0–46.0)
HEMOGLOBIN: 13.6 g/dL (ref 12.0–15.0)
MCH: 28.6 pg (ref 26.0–34.0)
MCHC: 32.5 g/dL (ref 30.0–36.0)
MCV: 88.2 fL (ref 78.0–100.0)
Platelets: 148 10*3/uL — ABNORMAL LOW (ref 150–400)
RBC: 4.75 MIL/uL (ref 3.87–5.11)
RDW: 13.2 % (ref 11.5–15.5)
WBC: 8.8 10*3/uL (ref 4.0–10.5)

## 2016-06-24 LAB — TYPE AND SCREEN
ABO/RH(D): O POS
ANTIBODY SCREEN: NEGATIVE

## 2016-06-24 LAB — PROTIME-INR
INR: 1.12
PROTHROMBIN TIME: 14.5 s (ref 11.4–15.2)

## 2016-06-24 SURGERY — HEMIARTHROPLASTY, HIP, DIRECT ANTERIOR APPROACH, FOR FRACTURE
Anesthesia: General | Laterality: Left

## 2016-06-24 MED ORDER — LACTATED RINGERS IV SOLN
INTRAVENOUS | Status: DC
  Administered 2016-06-24: 50 mL/h via INTRAVENOUS
  Administered 2016-06-25 – 2016-06-26 (×2): via INTRAVENOUS

## 2016-06-24 MED ORDER — PROPOFOL 10 MG/ML IV BOLUS
INTRAVENOUS | Status: AC
  Filled 2016-06-24: qty 20

## 2016-06-24 MED ORDER — ONDANSETRON HCL 4 MG/2ML IJ SOLN
4.0000 mg | Freq: Four times a day (QID) | INTRAMUSCULAR | Status: DC | PRN
Start: 2016-06-24 — End: 2016-06-27

## 2016-06-24 MED ORDER — ONDANSETRON HCL 4 MG/2ML IJ SOLN
INTRAMUSCULAR | Status: AC
  Filled 2016-06-24: qty 2

## 2016-06-24 MED ORDER — PHENYLEPHRINE HCL 10 MG/ML IJ SOLN
INTRAVENOUS | Status: DC | PRN
  Administered 2016-06-24: 50 ug/min via INTRAVENOUS

## 2016-06-24 MED ORDER — ACETAMINOPHEN 325 MG PO TABS
650.0000 mg | ORAL_TABLET | Freq: Four times a day (QID) | ORAL | Status: DC | PRN
  Administered 2016-06-24 – 2016-06-27 (×3): 650 mg via ORAL
  Filled 2016-06-24 (×3): qty 2

## 2016-06-24 MED ORDER — ACETAMINOPHEN 650 MG RE SUPP: 650.0000 mg | Freq: Four times a day (QID) | RECTAL | Status: DC | PRN

## 2016-06-24 MED ORDER — ONDANSETRON HCL 4 MG PO TABS: 4.0000 mg | ORAL_TABLET | Freq: Four times a day (QID) | ORAL | Status: DC | PRN

## 2016-06-24 MED ORDER — CEFAZOLIN SODIUM 1 G IJ SOLR
INTRAMUSCULAR | Status: AC
  Filled 2016-06-24: qty 20

## 2016-06-24 MED ORDER — ROCURONIUM BROMIDE 100 MG/10ML IV SOLN
INTRAVENOUS | Status: DC | PRN
  Administered 2016-06-24: 50 mg via INTRAVENOUS
  Administered 2016-06-24 (×2): 25 mg via INTRAVENOUS

## 2016-06-24 MED ORDER — MENTHOL 3 MG MT LOZG: 1.0000 | LOZENGE | OROMUCOSAL | Status: DC | PRN

## 2016-06-24 MED ORDER — ONDANSETRON HCL 4 MG/2ML IJ SOLN
INTRAMUSCULAR | Status: DC | PRN
  Administered 2016-06-24: 4 mg via INTRAVENOUS

## 2016-06-24 MED ORDER — GLYCOPYRROLATE 0.2 MG/ML IJ SOLN
INTRAMUSCULAR | Status: DC | PRN
  Administered 2016-06-24: 0.1 mg via INTRAVENOUS

## 2016-06-24 MED ORDER — LACTATED RINGERS IV SOLN
INTRAVENOUS | Status: DC | PRN
  Administered 2016-06-24 (×2): via INTRAVENOUS

## 2016-06-24 MED ORDER — LACTATED RINGERS IV SOLN: INTRAVENOUS | Status: DC

## 2016-06-24 MED ORDER — METOCLOPRAMIDE HCL 5 MG/ML IJ SOLN: 5.0000 mg | Freq: Three times a day (TID) | INTRAMUSCULAR | Status: DC | PRN

## 2016-06-24 MED ORDER — HYDROMORPHONE HCL 1 MG/ML IJ SOLN: 0.2500 mg | INTRAMUSCULAR | Status: DC | PRN

## 2016-06-24 MED ORDER — FENTANYL CITRATE (PF) 100 MCG/2ML IJ SOLN
INTRAMUSCULAR | Status: DC | PRN
  Administered 2016-06-24: 50 ug via INTRAVENOUS
  Administered 2016-06-24 (×2): 25 ug via INTRAVENOUS

## 2016-06-24 MED ORDER — METOCLOPRAMIDE HCL 5 MG PO TABS
5.0000 mg | ORAL_TABLET | Freq: Three times a day (TID) | ORAL | Status: DC | PRN
Start: 2016-06-24 — End: 2016-06-26

## 2016-06-24 MED ORDER — ROCURONIUM BROMIDE 10 MG/ML (PF) SYRINGE
PREFILLED_SYRINGE | INTRAVENOUS | Status: AC
  Filled 2016-06-24: qty 5

## 2016-06-24 MED ORDER — PHENYLEPHRINE HCL 10 MG/ML IJ SOLN
INTRAMUSCULAR | Status: DC | PRN
  Administered 2016-06-24 (×2): 120 ug via INTRAVENOUS
  Administered 2016-06-24: 160 ug via INTRAVENOUS
  Administered 2016-06-24: 120 ug via INTRAVENOUS

## 2016-06-24 MED ORDER — LIDOCAINE 2% (20 MG/ML) 5 ML SYRINGE
INTRAMUSCULAR | Status: AC
  Filled 2016-06-24: qty 5

## 2016-06-24 MED ORDER — 0.9 % SODIUM CHLORIDE (POUR BTL) OPTIME
TOPICAL | Status: DC | PRN
  Administered 2016-06-24: 1000 mL

## 2016-06-24 MED ORDER — PROMETHAZINE HCL 25 MG/ML IJ SOLN: 6.2500 mg | INTRAMUSCULAR | Status: DC | PRN

## 2016-06-24 MED ORDER — CEFAZOLIN SODIUM 1 G IJ SOLR
INTRAMUSCULAR | Status: DC | PRN
Start: 2016-06-24 — End: 2016-06-24
  Administered 2016-06-24: 2 g via INTRAMUSCULAR

## 2016-06-24 MED ORDER — ENOXAPARIN SODIUM 40 MG/0.4ML ~~LOC~~ SOLN
40.0000 mg | SUBCUTANEOUS | Status: DC
  Administered 2016-06-25 – 2016-06-27 (×3): 40 mg via SUBCUTANEOUS
  Filled 2016-06-24 (×3): qty 0.4

## 2016-06-24 MED ORDER — ALBUMIN HUMAN 5 % IV SOLN
INTRAVENOUS | Status: DC | PRN
  Administered 2016-06-24 (×3): via INTRAVENOUS

## 2016-06-24 MED ORDER — SUGAMMADEX SODIUM 200 MG/2ML IV SOLN
INTRAVENOUS | Status: DC | PRN
  Administered 2016-06-24: 200 mg via INTRAVENOUS

## 2016-06-24 MED ORDER — LIDOCAINE HCL (CARDIAC) 20 MG/ML IV SOLN
INTRAVENOUS | Status: DC | PRN
  Administered 2016-06-24: 60 mg via INTRAVENOUS

## 2016-06-24 MED ORDER — CEFAZOLIN SODIUM-DEXTROSE 2-4 GM/100ML-% IV SOLN
2.0000 g | Freq: Four times a day (QID) | INTRAVENOUS | Status: AC
  Administered 2016-06-24 – 2016-06-25 (×2): 2 g via INTRAVENOUS
  Filled 2016-06-24 (×2): qty 100

## 2016-06-24 MED ORDER — ARTIFICIAL TEARS OPHTHALMIC OINT
TOPICAL_OINTMENT | OPHTHALMIC | Status: AC
  Filled 2016-06-24: qty 3.5

## 2016-06-24 MED ORDER — PROPOFOL 10 MG/ML IV BOLUS
INTRAVENOUS | Status: DC | PRN
  Administered 2016-06-24: 110 mg via INTRAVENOUS
  Administered 2016-06-24: 30 mg via INTRAVENOUS
  Administered 2016-06-24: 50 mg via INTRAVENOUS

## 2016-06-24 MED ORDER — OXYCODONE HCL 5 MG/5ML PO SOLN: 5.0000 mg | Freq: Once | ORAL | Status: DC | PRN

## 2016-06-24 MED ORDER — FENTANYL CITRATE (PF) 250 MCG/5ML IJ SOLN
INTRAMUSCULAR | Status: AC
  Filled 2016-06-24: qty 5

## 2016-06-24 MED ORDER — PHENOL 1.4 % MT LIQD: 1.0000 | OROMUCOSAL | Status: DC | PRN

## 2016-06-24 MED ORDER — OXYCODONE HCL 5 MG PO TABS: 5.0000 mg | ORAL_TABLET | Freq: Once | ORAL | Status: DC | PRN

## 2016-06-24 SURGICAL SUPPLY — 51 items
BLADE SAW SAG 73X25 THK (BLADE) ×2
BLADE SAW SGTL 73X25 THK (BLADE) ×1 IMPLANT
BRUSH FEMORAL CANAL (MISCELLANEOUS) IMPLANT
BRUSH SCRUB SURG 4.25 DISP (MISCELLANEOUS) ×6 IMPLANT
CAPT HIP HEMI 1 ×3 IMPLANT
COVER SURGICAL LIGHT HANDLE (MISCELLANEOUS) ×3 IMPLANT
DRAPE INCISE IOBAN 85X60 (DRAPES) ×3 IMPLANT
DRAPE ORTHO SPLIT 77X108 STRL (DRAPES) ×4
DRAPE SURG ORHT 6 SPLT 77X108 (DRAPES) ×2 IMPLANT
DRAPE U-SHAPE 47X51 STRL (DRAPES) ×3 IMPLANT
DRILL BIT 7/64X5 (BIT) ×3 IMPLANT
DRSG MEPILEX BORDER 4X8 (GAUZE/BANDAGES/DRESSINGS) ×3 IMPLANT
ELECT BLADE 6.5 EXT (BLADE) IMPLANT
ELECT CAUTERY BLADE 6.4 (BLADE) IMPLANT
ELECT REM PT RETURN 9FT ADLT (ELECTROSURGICAL) ×3
ELECTRODE REM PT RTRN 9FT ADLT (ELECTROSURGICAL) ×1 IMPLANT
GLOVE BIO SURGEON STRL SZ7.5 (GLOVE) ×3 IMPLANT
GLOVE BIO SURGEON STRL SZ8 (GLOVE) ×3 IMPLANT
GLOVE BIOGEL PI IND STRL 8 (GLOVE) ×2 IMPLANT
GLOVE BIOGEL PI INDICATOR 8 (GLOVE) ×4
GOWN STRL REUS W/ TWL LRG LVL3 (GOWN DISPOSABLE) ×2 IMPLANT
GOWN STRL REUS W/ TWL XL LVL3 (GOWN DISPOSABLE) ×1 IMPLANT
GOWN STRL REUS W/TWL 2XL LVL3 (GOWN DISPOSABLE) IMPLANT
GOWN STRL REUS W/TWL LRG LVL3 (GOWN DISPOSABLE) ×4
GOWN STRL REUS W/TWL XL LVL3 (GOWN DISPOSABLE) ×2
HANDPIECE INTERPULSE COAX TIP (DISPOSABLE)
KIT BASIN OR (CUSTOM PROCEDURE TRAY) ×3 IMPLANT
KIT ROOM TURNOVER OR (KITS) ×3 IMPLANT
MANIFOLD NEPTUNE II (INSTRUMENTS) ×3 IMPLANT
NEEDLE 1/2 CIR MAYO (NEEDLE) ×3 IMPLANT
NS IRRIG 1000ML POUR BTL (IV SOLUTION) ×3 IMPLANT
PACK TOTAL JOINT (CUSTOM PROCEDURE TRAY) ×3 IMPLANT
PAD ARMBOARD 7.5X6 YLW CONV (MISCELLANEOUS) ×6 IMPLANT
PILLOW ABDUCTION HIP (SOFTGOODS) ×3 IMPLANT
PRESSURIZER FEMORAL UNIV (MISCELLANEOUS) IMPLANT
RETRIEVER SUT HEWSON (MISCELLANEOUS) ×3 IMPLANT
SET HNDPC FAN SPRY TIP SCT (DISPOSABLE) IMPLANT
STAPLER VISISTAT 35W (STAPLE) ×3 IMPLANT
SUT ETHILON 2 0 PSLX (SUTURE) ×6 IMPLANT
SUT FIBERWIRE #2 38 T-5 BLUE (SUTURE) ×6
SUT VIC AB 1 CT1 27 (SUTURE) ×2
SUT VIC AB 1 CT1 27XBRD ANBCTR (SUTURE) ×1 IMPLANT
SUT VIC AB 1 CTX 18 (SUTURE) ×3 IMPLANT
SUT VIC AB 1 CTX 27 (SUTURE) ×3 IMPLANT
SUT VIC AB 2-0 CT1 27 (SUTURE) ×4
SUT VIC AB 2-0 CT1 TAPERPNT 27 (SUTURE) ×2 IMPLANT
SUTURE FIBERWR #2 38 T-5 BLUE (SUTURE) ×2 IMPLANT
TOWEL OR 17X24 6PK STRL BLUE (TOWEL DISPOSABLE) ×3 IMPLANT
TOWEL OR 17X26 10 PK STRL BLUE (TOWEL DISPOSABLE) ×3 IMPLANT
TOWER CARTRIDGE SMART MIX (DISPOSABLE) IMPLANT
WATER STERILE IRR 1000ML POUR (IV SOLUTION) ×3 IMPLANT

## 2016-06-24 NOTE — Transfer of Care (Signed)
Immediate Anesthesia Transfer of Care Note  Patient: Abigail Taylor  Procedure(s) Performed: Procedure(s): ARTHROPLASTY BIPOLAR HIP (HEMIARTHROPLASTY) (Left)  Patient Location: PACU  Anesthesia Type:General  Level of Consciousness: awake, alert  and patient cooperative  Airway & Oxygen Therapy: Patient Spontanous Breathing and Patient connected to face mask oxygen  Post-op Assessment: Report given to RN, Post -op Vital signs reviewed and stable, Patient moving all extremities X 4 and Patient able to stick tongue midline  Post vital signs: Reviewed and stable  Last Vitals:  Vitals:   06/23/16 1900 06/24/16 0621  BP: (!) 123/59 (!) 125/52  Pulse: 71 75  Resp: (!) 8 (!) 7  Temp:  36.4 C    Last Pain:  Vitals:   06/24/16 1032  TempSrc:   PainSc: 7          Complications: No apparent anesthesia complications

## 2016-06-24 NOTE — Anesthesia Preprocedure Evaluation (Signed)
Anesthesia Evaluation  Patient identified by MRN, date of birth, ID band Patient awake    Reviewed: Allergy & Precautions, NPO status , Patient's Chart, lab work & pertinent test results  Airway Mallampati: II  TM Distance: >3 FB Neck ROM: Full    Dental no notable dental hx.    Pulmonary neg pulmonary ROS,    Pulmonary exam normal breath sounds clear to auscultation       Cardiovascular negative cardio ROS Normal cardiovascular exam+ pacemaker  Rhythm:Regular Rate:Normal     Neuro/Psych negative neurological ROS  negative psych ROS   GI/Hepatic negative GI ROS, Neg liver ROS,   Endo/Other  negative endocrine ROSHypothyroidism   Renal/GU negative Renal ROS  negative genitourinary   Musculoskeletal negative musculoskeletal ROS (+) Arthritis ,   Abdominal   Peds negative pediatric ROS (+)  Hematology negative hematology ROS (+)   Anesthesia Other Findings   Reproductive/Obstetrics negative OB ROS                             Anesthesia Physical Anesthesia Plan  ASA: III  Anesthesia Plan: General   Post-op Pain Management:    Induction: Intravenous  Airway Management Planned: Oral ETT  Additional Equipment:   Intra-op Plan:   Post-operative Plan: Extubation in OR  Informed Consent: I have reviewed the patients History and Physical, chart, labs and discussed the procedure including the risks, benefits and alternatives for the proposed anesthesia with the patient or authorized representative who has indicated his/her understanding and acceptance.   Dental advisory given  Plan Discussed with: CRNA  Anesthesia Plan Comments:         Anesthesia Quick Evaluation

## 2016-06-24 NOTE — Brief Op Note (Signed)
06/23/2016 - 06/24/2016  4:16 PM  PATIENT:  Abigail Taylor  83 y.o. female  PRE-OPERATIVE DIAGNOSIS:  Displaced left femoral neck fracture  POST-OPERATIVE DIAGNOSIS:  Displaced left femoral neck fracture  PROCEDURE:  Procedure(s): ARTHROPLASTY HIP HEMIARTHROPLASTY (Left) [place] [place] Basic #5 stem, standard neck, 65mm head  SURGEON:  Surgeon(s) and Role:    Altamese Landfall, MD - Primary  ASSISTANTS: Surg Tech   ANESTHESIA:   general  EBL:  Total I/O In: 2315.8 [I.V.:1815.8; IV Piggyback:500] Out: 425 [Urine:275; Blood:150]  BLOOD ADMINISTERED:none  DRAINS: none   LOCAL MEDICATIONS USED:  NONE  SPECIMEN:  No Specimen  DISPOSITION OF SPECIMEN:  N/A  COUNTS:  YES  TOURNIQUET:  * No tourniquets in log *  DICTATION: .Other Dictation: Dictation Number [id number]  PLAN OF CARE: Admit to inpatient   PATIENT DISPOSITION:  PACU - hemodynamically stable.   Delay start of Pharmacological VTE agent (>24hrs) due to surgical blood loss or risk of bleeding: Lovenox tomorrow and Eliquis on Monday

## 2016-06-24 NOTE — H&P (Signed)
Internal Medicine Attending Admission Note  I saw and evaluated the patient. I reviewed the resident's note and I agree with the resident's findings and plan as documented in the resident's note.  Assessment & Plan by Problem:   Closed subcapital fracture of left femur ([place]) - Ortho on board, pain control, allow for eliquis washout.    Paroxysmal atrial fibrillation ([place]),  Tachy-brady syndrome ([place]) - Holding eliquis - Currently rate well controlled, she takes diltiazem as needed for tachycardia.    Parkinson disease ([place]) with orthostatic hypotension - Sinmet 25-100 TID - May resume florinef if orthostatic (apparently not taking at home)    Hypokalemia possibly 2/2 florinef - Replaced  Urinary retention -likely 2/2 pain medication, foley has been placed, will likely need to stay in place until after surgery.  Chief Complaint(s): left hip pain  History - key components related to admission: Briefly Abigail Taylor is a 83 year old female with past medical history of A. fib with tachybradycardia syndrome status post permanent pacemaker, Parkinson's disease, hypothyroidism, hyperlipidemia. She presented to the hospital with left hip pain after a fall out of bed. She immediately had severe left hip pain and was unable to use her leg. In the ED she was found to have a subcapital fracture of her left femur. Orthopedics was consulted and recommended admission with Eliquis washout and eventual hemiarthyroplasty versus THA. In the ED she was also noted to be hypertensive, of note she has a history of orthostatic hypotension secondary to her Parkinson's disease for which she is prescribed Florinef however apparently she has not been taking this recently. She was also noted to have hypokalemia.  Lab results: Reviewed in Epic  Physical Exam - key components related to admission: General: resting in bed, no distress Cardiac: RRR, no rubs, murmurs or gallops Pulm: clear to auscultation  bilaterally in anterior lung fields Abd: soft, nontender, nondistended, BS present Ext: left leg externally rotated, no ecchymosis noted over left hip Neuro: alert and oriented X3  Vitals:   06/23/16 1800 06/23/16 1815 06/23/16 1900 06/24/16 0621  BP: 138/74 130/60 (!) 123/59 (!) 125/52  Pulse: 75 73 71 75  Resp: 20 (!) 9 (!) 8 (!) 7  Temp:    97.6 F (36.4 C)  TempSrc:    Oral  SpO2:    98%  Weight:      Height:

## 2016-06-24 NOTE — Anesthesia Procedure Notes (Signed)
Procedure Name: Intubation Date/Time: 06/24/2016 1:53 PM Performed by: Candida Peeling RAY Pre-anesthesia Checklist: Patient identified, Emergency Drugs available, Suction available and Patient being monitored Patient Re-evaluated:Patient Re-evaluated prior to inductionOxygen Delivery Method: Circle system utilized Preoxygenation: Pre-oxygenation with 100% oxygen Intubation Type: IV induction Ventilation: Mask ventilation without difficulty Laryngoscope Size: Mac and 3 Grade View: Grade II Tube type: Oral Tube size: 7.0 mm Number of attempts: 1 Airway Equipment and Method: Stylet Placement Confirmation: ETT inserted through vocal cords under direct vision,  positive ETCO2 and breath sounds checked- equal and bilateral Secured at: 22 cm Tube secured with: Tape Dental Injury: Teeth and Oropharynx as per pre-operative assessment

## 2016-06-24 NOTE — Progress Notes (Signed)
   Subjective:  Patient states she has improvement in her pain with pain medicine and with lying still. She has no complaints of numbness, tingling, chest pain, shortness of breath.  Objective:  Vital signs in last 24 hours: Vitals:   06/23/16 1800 06/23/16 1815 06/23/16 1900 06/24/16 0621  BP: 138/74 130/60 (!) 123/59 (!) 125/52  Pulse: 75 73 71 75  Resp: 20 (!) 9 (!) 8 (!) 7  Temp:    97.6 F (36.4 C)  TempSrc:    Oral  SpO2:    98%  Weight:      Height:       Constitutional: NAD, lying in bed comfortably, tired appearing but just received pain medicine CV: RRR, no murmurs, rubs or gallops appreciated, no LE edema; warm extremities, pulses intact Resp: CTAB, no increased work of breathing Abd: Soft, NDNT Msk: no swelling of left hip, left LE shorter and externally rotated   Assessment/Plan:  Principal Problem:   Closed subcapital fracture of left femur (HCC) Active Problems:   Paroxysmal atrial fibrillation (HCC)   Parkinson disease (HCC)   Hypokalemia   Tachy-brady syndrome (HCC)   Orthostatic hypotension due to Parkinson's disease (HCC)  Closed subcapital fracture of left femur: Pain well controlled. --ortho following and plan on surgery following eliquis washout --pain control with dilaudid and oxy-apap PRN  PAF, tachy-brady syndrome: Normal rate, regular rhythm. --hold eliquis as above --diltiazem prn for tachycardia  Parkinson disease: --Sinmet 25-100mg  TID  Orthostatic hypotension: Patient has not been taking florinef at home; asymptomatic now. --can restart florinef if needed  Urinary retention: New urinary retention in setting of pain med administration. --foley placed overnight; will keep in for comfort as she is waiting on surgery   Hypothyroidism: Continue home synthroid.  Dispo: Anticipated discharge in approximately 5-6 day(s).   Alphonzo Grieve, MD 06/24/2016, 12:05 PM Pager [phone]

## 2016-06-25 LAB — BASIC METABOLIC PANEL
Anion gap: 10 (ref 5–15)
BUN: 7 mg/dL (ref 6–20)
CHLORIDE: 99 mmol/L — AB (ref 101–111)
CO2: 24 mmol/L (ref 22–32)
CREATININE: 0.67 mg/dL (ref 0.44–1.00)
Calcium: 8.3 mg/dL — ABNORMAL LOW (ref 8.9–10.3)
Glucose, Bld: 121 mg/dL — ABNORMAL HIGH (ref 65–99)
POTASSIUM: 3.6 mmol/L (ref 3.5–5.1)
SODIUM: 133 mmol/L — AB (ref 135–145)

## 2016-06-25 LAB — CBC
HCT: 36.1 % (ref 36.0–46.0)
HEMOGLOBIN: 11.7 g/dL — AB (ref 12.0–15.0)
MCH: 28 pg (ref 26.0–34.0)
MCHC: 32.4 g/dL (ref 30.0–36.0)
MCV: 86.4 fL (ref 78.0–100.0)
PLATELETS: 143 10*3/uL — AB (ref 150–400)
RBC: 4.18 MIL/uL (ref 3.87–5.11)
RDW: 12.9 % (ref 11.5–15.5)
WBC: 8.1 10*3/uL (ref 4.0–10.5)

## 2016-06-25 NOTE — Progress Notes (Signed)
  Date: 06/25/2016  Patient name: Abigail Taylor  Medical record number: [id number]  Date of birth: 1931/09/23   This patient's plan of care was discussed with the house staff. Please see their note for complete details. I concur with their findings.   Sid Falcon, MD 06/25/2016, 12:23 PM

## 2016-06-25 NOTE — Progress Notes (Signed)
SPORTS MEDICINE AND JOINT REPLACEMENT  Lara Mulch, MD    Carlyon Shadow, PA-C [place], [place], [place]  [address]                             [phone]   PROGRESS NOTE  Subjective:  negative for Chest Pain  negative for Shortness of Breath  negative for Nausea/Vomiting   negative for Calf Pain  negative for Bowel Movement   Tolerating Diet: yes         Patient reports pain as 3 on 0-10 scale.    Objective: Vital signs in last 24 hours:   Patient Vitals for the past 24 hrs:  BP Temp Temp src Pulse Resp SpO2  06/25/16 0608 (!) 159/60 97.5 F (36.4 C) Oral 90 - 98 %  06/25/16 0056 (!) 151/47 98 F (36.7 C) Oral 77 - 98 %  06/24/16 2004 (!) 116/40 97.5 F (36.4 C) Oral 83 - 97 %  06/24/16 1710 137/62 98.8 F (37.1 C) - 80 11 92 %  06/24/16 1655 (!) 147/64 - - 79 11 91 %  06/24/16 1640 (!) 130/58 - - 81 10 91 %  06/24/16 1625 136/69 - - 82 13 94 %  06/24/16 1610 132/60 98.6 F (37 C) - 78 12 100 %    @flow {1959:LAST@   Intake/Output from previous day:   06/01 0701 - 06/02 0700 In: 2315.8 [I.V.:1815.8] Out: 525 [Urine:375]   Intake/Output this shift:   06/02 0701 - 06/02 1900 In: -  Out: 550 [Urine:550]   Intake/Output      06/01 0701 - 06/02 0700 06/02 0701 - 06/03 0700   P.O. 0    I.V. (mL/kg) 1815.8 (30.8)    IV Piggyback 500    Total Intake(mL/kg) 2315.8 (39.3)    Urine (mL/kg/hr) 375 (0.3) 550 (11.6)   Blood 150 (0.1)    Total Output 525 550   Net +1790.8 -550           LABORATORY DATA:  Recent Labs  06/23/16 1144 06/23/16 1200 06/24/16 0745 06/25/16 0624  WBC 12.1*  --  8.8 8.1  HGB 13.8 13.9 13.6 11.7*  HCT 41.7 41.0 41.9 36.1  PLT 171  --  148* 143*    Recent Labs  06/23/16 1144 06/23/16 1200 06/24/16 0745  NA 141 143 138  K 3.2* 3.2* 4.1  CL 109 105 106  CO2 26  --  26  BUN 11 15 9   CREATININE 0.68 0.60 0.61  GLUCOSE 106* 101* 116*  CALCIUM 8.3*  --  8.6*   Lab Results  Component Value Date   INR 1.12  06/24/2016   INR 1.15 05/07/2014   INR 0.94 07/04/2011    Examination:  General appearance: alert, cooperative and no distress Extremities: extremities normal, atraumatic, no cyanosis or edema  Wound Exam: clean, dry, intact   Drainage:  None: wound tissue dry  Motor Exam: Quadriceps and Hamstrings Intact  Sensory Exam: Superficial Peroneal, Deep Peroneal and Tibial normal   Assessment:    1 Day Post-Op  Procedure(s) (LRB): ARTHROPLASTY BIPOLAR HIP (HEMIARTHROPLASTY) (Left)  ADDITIONAL DIAGNOSIS:  Principal Problem:   Closed subcapital fracture of left femur (HCC) Active Problems:   Paroxysmal atrial fibrillation (HCC)   Parkinson disease (HCC)   Hypokalemia   Tachy-brady syndrome (HCC)   Orthostatic hypotension due to Parkinson's disease ([place])    Plan: Physical Therapy as ordered Weight Bearing as Tolerated (WBAT)  DVT Prophylaxis:  Lovenox  DISCHARGE PLAN: Skilled Nursing Facility/Rehab  Patient doing well. Medicine following. Will continue to see through the weekend with recommended SNF D/C on [place] 06/25/2016, 7:49 AM

## 2016-06-25 NOTE — Evaluation (Signed)
Physical Therapy Evaluation Patient Details Name: Abigail Taylor MRN: [id number] DOB: Feb 22, 1931 Today's Date: 06/25/2016   History of Present Illness  Pt is a 83 y.o. female s/p L hip hemiarthroplasty. PMHx: Afib, Brady-tachy syndrome, Chronic back pain, OA, Parkinson's Disease, Dementia.  Clinical Impression  Patient is s/p above surgery resulting in functional limitations due to the deficits listed below (see PT Problem List). Pt total assist to bring EoB. Pt able to sit EoB for 10 minutes with min- modA for posterior and right lateral lean. Pt given maximal verbal and tactile cues for correcting lean and achieving balance.  Patient will benefit from skilled PT to increase their independence and safety with mobility to allow discharge to the venue listed below.      Follow Up Recommendations SNF;Supervision/Assistance - 24 hour    Equipment Recommendations  Other (comment) (to be determined at next venue)    Recommendations for Other Services OT consult     Precautions / Restrictions Precautions Precautions: Fall Restrictions Weight Bearing Restrictions: Yes LLE Weight Bearing: Weight bearing as tolerated      Mobility  Bed Mobility Overal bed mobility: Needs Assistance Bed Mobility: Supine to Sit;Sit to Supine     Supine to sit: Total assist;+2 for physical assistance Sit to supine: Total assist;+2 for physical assistance   General bed mobility comments: Despite cues, pt not participating in bed mobility. Total assist with use of bed pad and hellicopter technique to get pt to EOB                Balance Overall balance assessment: Needs assistance;History of Falls Sitting-balance support: Feet supported;Bilateral upper extremity supported Sitting balance-Leahy Scale: Poor Sitting balance - Comments: Min-max assist for sitting balance. Cues for upright posture, leaning, and reaching for objects but pt unable to maintain upright posture in sitting without physical  assist Postural control: Posterior lean;Right lateral lean                                   Pertinent Vitals/Pain Pain Assessment: Faces Faces Pain Scale: Hurts even more Pain Location: L hip Pain Descriptors / Indicators: Grimacing;Guarding Pain Intervention(s): Monitored during session  VSS    Home Living Family/patient expects to be discharged to:: Private residence Living Arrangements: Spouse/significant other   Type of Home: House Home Access: Stairs to enter   Technical brewer of Steps: 1 Home Layout: One level Home Equipment: Bedside commode      Prior Function Level of Independence: Independent                  Extremity/Trunk Assessment   Upper Extremity Assessment Upper Extremity Assessment: Defer to OT evaluation    Lower Extremity Assessment Lower Extremity Assessment: LLE deficits/detail LLE Deficits / Details: L hip hemiarthroplasty, increased hip IR requiring mod A to maintian in neutral position in seated  LLE: Unable to fully assess due to pain    Cervical / Trunk Assessment Cervical / Trunk Assessment: Kyphotic  Communication   Communication: No difficulties  Cognition Arousal/Alertness: Awake/alert Behavior During Therapy: WFL for tasks assessed/performed Overall Cognitive Status: Impaired/Different from baseline                                 General Comments: Pt with baseline dementia but husband reports she is more confused than normal with hallucinations  Assessment/Plan    PT Assessment Patient needs continued PT services  PT Problem List Decreased strength;Decreased range of motion;Decreased activity tolerance;Decreased balance;Decreased mobility;Decreased cognition;Decreased safety awareness;Pain       PT Treatment Interventions DME instruction;Gait training;Functional mobility training;Therapeutic activities;Therapeutic exercise;Balance training;Patient/family  education;Cognitive remediation    PT Goals (Current goals can be found in the Care Plan section)  Acute Rehab PT Goals Patient Stated Goal: decrease pain PT Goal Formulation: With patient Time For Goal Achievement: 07/09/16 Potential to Achieve Goals: Fair    Frequency Min 3X/week   Barriers to discharge        Co-evaluation PT/OT/SLP Co-Evaluation/Treatment: Yes Reason for Co-Treatment: Necessary to address cognition/behavior during functional activity PT goals addressed during session: Mobility/safety with mobility;Balance OT goals addressed during session: ADL's and self-care       AM-PAC PT "6 Clicks" Daily Activity  Outcome Measure Difficulty turning over in bed (including adjusting bedclothes, sheets and blankets)?: Total Difficulty moving from lying on back to sitting on the side of the bed? : Total Difficulty sitting down on and standing up from a chair with arms (e.g., wheelchair, bedside commode, etc,.)?: Total Help needed moving to and from a bed to chair (including a wheelchair)?: Total Help needed walking in hospital room?: Total Help needed climbing 3-5 steps with a railing? : Total 6 Click Score: 6    End of Session   Activity Tolerance: Patient limited by pain;Patient limited by fatigue Patient left: in bed;with call bell/phone within reach;with family/visitor present Nurse Communication: Mobility status;Weight bearing status PT Visit Diagnosis: Other abnormalities of gait and mobility (R26.89);History of falling (Z91.81);Muscle weakness (generalized) (M62.81);Pain;Other symptoms and signs involving the nervous system (R29.898) Pain - Right/Left: Left Pain - part of body: Hip    Time: 0932-3557 PT Time Calculation (min) (ACUTE ONLY): 41 min   Charges:   PT Evaluation $PT Eval Low Complexity: 1 Procedure PT Treatments $Therapeutic Activity: 8-22 mins   PT G Codes:        Abigail Taylor PT, DPT Acute Rehabilitation  [phone] Pager  [phone]    Abigail Taylor 06/25/2016, 4:07 PM

## 2016-06-25 NOTE — Anesthesia Postprocedure Evaluation (Signed)
Anesthesia Post Note  Patient: Abigail Taylor  Procedure(s) Performed: Procedure(s) (LRB): ARTHROPLASTY BIPOLAR HIP (HEMIARTHROPLASTY) (Left)     Patient location during evaluation: PACU Anesthesia Type: General Level of consciousness: awake and alert Pain management: pain level controlled Vital Signs Assessment: post-procedure vital signs reviewed and stable Respiratory status: spontaneous breathing, nonlabored ventilation, respiratory function stable and patient connected to nasal cannula oxygen Cardiovascular status: blood pressure returned to baseline and stable Postop Assessment: no signs of nausea or vomiting Anesthetic complications: no    Last Vitals:  Vitals:   06/25/16 0056 06/25/16 0608  BP: (!) 151/47 (!) 159/60  Pulse: 77 90  Resp:    Temp: 36.7 C 36.4 C    Last Pain:  Vitals:   06/25/16 1000  TempSrc:   PainSc: 2                  Catalina Gravel

## 2016-06-25 NOTE — Progress Notes (Signed)
   Subjective:  Patient seen and examined this morning.  She is POD#1 for left femur fracture.  Her pain is well-controlled.  She was mildly confused after anesthesia but doing better this morning.  Her husband is present.  Objective:  Vital signs in last 24 hours: Vitals:   06/24/16 1710 06/24/16 2004 06/25/16 0056 06/25/16 0608  BP: 137/62 (!) 116/40 (!) 151/47 (!) 159/60  Pulse: 80 83 77 90  Resp: 11     Temp: 98.8 F (37.1 C) 97.5 F (36.4 C) 98 F (36.7 C) 97.5 F (36.4 C)  TempSrc:  Oral Oral Oral  SpO2: 92% 97% 98% 98%  Weight:      Height:       Constitutional: NAD, lying in bed comfortably, pleasant CV: Irregularly irregular rhythm, no murmurs, rubs or gallops appreciated, no LE edema; warm extremities, pulses intact Resp: CTAB, no increased work of breathing Abd: Soft, non-tender, non-distended Msk: bandage C/D/I over surgical site.  Sensation intact over left extremity  Assessment/Plan:  Principal Problem:   Closed subcapital fracture of left femur (HCC) Active Problems:   Paroxysmal atrial fibrillation (HCC)   Parkinson disease (HCC)   Hypokalemia   Tachy-brady syndrome (HCC)   Orthostatic hypotension due to Parkinson's disease ([place])  Closed subcapital fracture of left femur: - Pain well controlled. - POD#1 with Dr. Marcelino Scot of orthopedics - Pain control with Oxy-APAP 5-325mg  Q6H PRN.  Will d/c Dilaudid PRN for now as not requiring at this time.  Can increase her PO pain meds or add back Dilaudid if needed - CSW consulted for SNF placement - PT/OT eval appreciated  PAF, tachy-brady syndrome: Normal rate, irregular rhythm. - hold eliquis as above until Monday per ortho recs - diltiazem prn for tachycardia  Parkinson disease: --Sinmet 25-100mg  TID  Orthostatic hypotension: Patient has not been taking florinef at home; asymptomatic now. --can restart florinef if needed  Urinary retention: New urinary retention in setting of pain med administration. -  Foley in place.  Leave in today due to surgery.  Dispo: Anticipated discharge in approximately 2-3 day(s).   Jule Ser, DO 06/25/2016, 9:37 AM Pager [phone]

## 2016-06-25 NOTE — Evaluation (Signed)
Occupational Therapy Evaluation Patient Details Name: Abigail Taylor MRN: [id number] DOB: 1931-06-19 Today's Date: 06/25/2016    History of Present Illness Pt is a 83 y.o. female s/p L hip hemiarthroplasty. PMHx: Afib, Brady-tachy syndrome, Chronic back pain, OA, Parkinson's Disease, Dementia.   Clinical Impression   Per husband, pt was independent with ADL PTA. Currently pt overall max assist for ADL and total assist +2 for bed mobility. Pt with posterior and R lateral lean in sitting requiring min-max assist for sitting balance. LLE noted to be internally rotated upon arrival and sitting EOB; positioned LLE in neutral at end of session. Recommending SNF for follow up to maximize independence and safety with ADL and functional mobility prior to return home. Pt would benefit from continued skilled OT to address established goals.    Follow Up Recommendations  SNF;Supervision/Assistance - 24 hour    Equipment Recommendations  Other (comment) (TBD at next venue)    Recommendations for Other Services       Precautions / Restrictions Precautions Precautions: Fall Restrictions Weight Bearing Restrictions: Yes LLE Weight Bearing: Weight bearing as tolerated      Mobility Bed Mobility Overal bed mobility: Needs Assistance Bed Mobility: Supine to Sit;Sit to Supine     Supine to sit: Total assist;+2 for physical assistance Sit to supine: Total assist;+2 for physical assistance   General bed mobility comments: Despite cues, pt not participating in bed mobility. Total assist with use of bed pad and hellicopter technique to get pt to EOB  Transfers                      Balance Overall balance assessment: Needs assistance;History of Falls Sitting-balance support: Feet supported;Bilateral upper extremity supported Sitting balance-Leahy Scale: Poor Sitting balance - Comments: Min-max assist for sitting balance. Cues for upright posture, leaning, and reaching for objects but pt  unable to maintain upright posture in sitting without physical assist Postural control: Posterior lean;Right lateral lean                                 ADL either performed or assessed with clinical judgement   ADL Overall ADL's : Needs assistance/impaired                                       General ADL Comments: Pt currently max assist for all ADL and total assist +2 for bed mobility. Discussed potential for post acute rehab with husband; he is unsure and reports pt will not want to go to nursing home. LLE noted to be internally rotated upon arrival; positioned pt at end of session with LLE straight in bed.     Vision         Perception     Praxis      Pertinent Vitals/Pain Pain Assessment: Faces Faces Pain Scale: Hurts even more Pain Location: L hip Pain Descriptors / Indicators: Grimacing;Guarding Pain Intervention(s): Monitored during session;Limited activity within patient's tolerance;Repositioned     Hand Dominance     Extremity/Trunk Assessment Upper Extremity Assessment Upper Extremity Assessment: Generalized weakness   Lower Extremity Assessment Lower Extremity Assessment: Defer to PT evaluation   Cervical / Trunk Assessment Cervical / Trunk Assessment: Kyphotic   Communication Communication Communication: No difficulties   Cognition Arousal/Alertness: Awake/alert Behavior During Therapy: WFL for tasks assessed/performed Overall Cognitive Status:  Impaired/Different from baseline                                 General Comments: Pt with baseline dementia but husband reports she is more confused than normal with hallucinations   General Comments       Exercises     Shoulder Instructions      Home Living Family/patient expects to be discharged to:: Private residence Living Arrangements: Spouse/significant other   Type of Home: House Home Access: Stairs to enter [employer] of Steps: 1    Home Layout: One level     Bathroom Shower/Tub: Tub/shower unit;Walk-in shower   Bathroom Toilet: Handicapped height Bathroom Accessibility: Yes   Home Equipment: Bedside commode          Prior Functioning/Environment Level of Independence: Independent                 OT Problem List: Decreased strength;Decreased range of motion;Impaired balance (sitting and/or standing);Decreased cognition;Decreased safety awareness;Decreased knowledge of use of DME or AE;Decreased knowledge of precautions;Cardiopulmonary status limiting activity;Pain      OT Treatment/Interventions: Self-care/ADL training;Energy conservation;DME and/or AE instruction;Therapeutic activities;Patient/family education;Balance training    OT Goals(Current goals can be found in the care plan section) Acute Rehab OT Goals Patient Stated Goal: decrease pain OT Goal Formulation: With patient Time For Goal Achievement: 07/09/16 Potential to Achieve Goals: Fair ADL Goals Pt Will Perform Grooming: with min guard assist;sitting (x2 tasks) Pt Will Perform Upper Body Bathing: with min guard assist;sitting Pt Will Perform Lower Body Bathing: sit to/from stand;with mod assist Pt Will Transfer to Toilet: with min assist;stand pivot transfer;bedside commode Pt Will Perform Toileting - Clothing Manipulation and hygiene: with min assist;sit to/from stand  OT Frequency: Min 2X/week   Barriers to D/C:            Co-evaluation PT/OT/SLP Co-Evaluation/Treatment: Yes Reason for Co-Treatment: Necessary to address cognition/behavior during functional activity;Complexity of the patient's impairments (multi-system involvement);For patient/therapist safety   OT goals addressed during session: ADL's and self-care      AM-PAC PT "6 Clicks" Daily Activity     Outcome Measure Help from another person eating meals?: A Lot Help from another person taking care of personal grooming?: A Lot Help from another person toileting,  which includes using toliet, bedpan, or urinal?: Total Help from another person bathing (including washing, rinsing, drying)?: Total Help from another person to put on and taking off regular upper body clothing?: A Lot Help from another person to put on and taking off regular lower body clothing?: Total 6 Click Score: 9   End of Session Equipment Utilized During Treatment: Oxygen  Activity Tolerance: Patient limited by pain Patient left: in bed;with call bell/phone within reach;with bed alarm set;with family/visitor present  OT Visit Diagnosis: Other abnormalities of gait and mobility (R26.89);History of falling (Z91.81);Muscle weakness (generalized) (M62.81);Pain Pain - Right/Left: Left Pain - part of body: Hip                Time: 1440-1504 OT Time Calculation (min): 24 min Charges:  OT General Charges $OT Visit: 1 Procedure OT Evaluation $OT Eval Moderate Complexity: 1 Procedure G-Codes:     Mikahla Wisor A. Ulice Brilliant, M.S., OTR/L Pager: [place] 06/25/2016, 3:50 PM

## 2016-06-26 DIAGNOSIS — Z79899 Other long term (current) drug therapy: Secondary | ICD-10-CM

## 2016-06-26 DIAGNOSIS — K5900 Constipation, unspecified: Secondary | ICD-10-CM

## 2016-06-26 DIAGNOSIS — I951 Orthostatic hypotension: Secondary | ICD-10-CM

## 2016-06-26 DIAGNOSIS — I10 Essential (primary) hypertension: Secondary | ICD-10-CM

## 2016-06-26 DIAGNOSIS — X58XXXD Exposure to other specified factors, subsequent encounter: Secondary | ICD-10-CM

## 2016-06-26 DIAGNOSIS — G20 Parkinson's disease: Secondary | ICD-10-CM

## 2016-06-26 DIAGNOSIS — Z7901 Long term (current) use of anticoagulants: Secondary | ICD-10-CM

## 2016-06-26 DIAGNOSIS — Z960 Presence of urogenital implants: Secondary | ICD-10-CM

## 2016-06-26 LAB — CBC
HCT: 35.7 % — ABNORMAL LOW (ref 36.0–46.0)
Hemoglobin: 12 g/dL (ref 12.0–15.0)
MCH: 28.4 pg (ref 26.0–34.0)
MCHC: 33.6 g/dL (ref 30.0–36.0)
MCV: 84.4 fL (ref 78.0–100.0)
Platelets: 164 10*3/uL (ref 150–400)
RBC: 4.23 MIL/uL (ref 3.87–5.11)
RDW: 12.9 % (ref 11.5–15.5)
WBC: 9.3 10*3/uL (ref 4.0–10.5)

## 2016-06-26 LAB — BASIC METABOLIC PANEL
Anion gap: 10 (ref 5–15)
BUN: 5 mg/dL — AB (ref 6–20)
CO2: 27 mmol/L (ref 22–32)
CREATININE: 0.55 mg/dL (ref 0.44–1.00)
Calcium: 8.3 mg/dL — ABNORMAL LOW (ref 8.9–10.3)
Chloride: 102 mmol/L (ref 101–111)
GFR calc non Af Amer: >60 mL/min (ref >60)
Glucose, Bld: 127 mg/dL — ABNORMAL HIGH (ref 65–99)
Potassium: 3 mmol/L — ABNORMAL LOW (ref 3.5–5.1)
SODIUM: 139 mmol/L (ref 135–145)

## 2016-06-26 MED ORDER — AMLODIPINE BESYLATE 5 MG PO TABS
5.0000 mg | ORAL_TABLET | Freq: Every day | ORAL | Status: DC
  Administered 2016-06-27: 5 mg via ORAL
  Filled 2016-06-26 (×2): qty 1

## 2016-06-26 MED ORDER — POTASSIUM CHLORIDE CRYS ER 20 MEQ PO TBCR
40.0000 meq | EXTENDED_RELEASE_TABLET | Freq: Two times a day (BID) | ORAL | Status: AC
  Administered 2016-06-26 (×2): 40 meq via ORAL
  Filled 2016-06-26 (×2): qty 2

## 2016-06-26 MED ORDER — GLYCERIN (LAXATIVE) 2.1 G RE SUPP
1.0000 | Freq: Once | RECTAL | Status: AC
  Administered 2016-06-26: 1 via RECTAL
  Filled 2016-06-26: qty 1

## 2016-06-26 NOTE — Progress Notes (Signed)
IMTS paged regarding patients blood pressure 173/66.

## 2016-06-26 NOTE — NC FL2 (Signed)
[place] MEDICAID FL2 LEVEL OF CARE SCREENING TOOL     IDENTIFICATION  Patient Name: Abigail Taylor Birthdate: [birth year] Sex: female Admission Date (Current Location): 06/23/2016  [facility] and [place] Number:  Herbalist and Address:  [facility] [place], [place] [address] [place], [place] [address]      Provider Number: [id number]  Attending Physician Name and Address:  Lucious Groves, DO  Relative Name and Phone Number:       Current Level of Care: SNF Recommended Level of Care: [place] Prior Approval Number:    Date Approved/Denied:   PASRR Number:    Discharge Plan: SNF    Current Diagnoses: Patient Active Problem List   Diagnosis Date Noted  . Tachy-brady syndrome ([place]) 06/23/2016  . Closed subcapital fracture of left femur ([place]) 06/23/2016  . Orthostatic hypotension due to Parkinson's disease ([place]) 06/23/2016  . Parkinson disease ([place]) 04/21/2016  . Hypokalemia 04/21/2016  . Normal coronary arteries 2001 05/08/2014  . Pacemaker 08/10/2012  . Aortic insufficiency 08/10/2012  . Constipation 12/27/2011  . Family hx of colon cancer 12/27/2011  . HTN (hypertension) 07/06/2011  . Hypothyroidism 07/05/2011  . Paroxysmal atrial fibrillation ([place]) 07/05/2011  . Sinus pause, 7 seconds post conversion, S/P MDT pacemaker 07/05/11 07/05/2011  . Dyslipidemia 07/05/2011  . Angina at rest, with rapid AF 07/05/2011    Orientation RESPIRATION BLADDER Height & Weight     Self, Time, Situation, Place  Normal Continent Weight: 130 lb (59 kg) Height:  5\' 6"  (167.6 cm)  BEHAVIORAL SYMPTOMS/MOOD NEUROLOGICAL BOWEL NUTRITION STATUS      Continent  (heart healthy)  AMBULATORY STATUS COMMUNICATION OF NEEDS Skin   Limited Assist Verbally Surgical wounds                       Personal Care Assistance Level of Assistance  Bathing, Feeding, Dressing Bathing Assistance: Limited assistance Feeding assistance: Independent Dressing  Assistance: Limited assistance     Functional Limitations Info  Sight, Hearing, Speech Sight Info: Adequate Hearing Info: Adequate Speech Info: Adequate    SPECIAL CARE FACTORS FREQUENCY  PT (By licensed PT), OT (By licensed OT)     PT Frequency:  (5x/week) OT Frequency:  (5x/week)            Contractures Contractures Info: Not present    Additional Factors Info  Code Status, Allergies (full code)   Allergies Info:  (NKDA)           Current Medications (06/26/2016):  This is the current hospital active medication list Current Facility-Administered Medications  Medication Dose Route Frequency Provider Last Rate Last Dose  . acetaminophen (TYLENOL) tablet 650 mg  650 mg Oral Q6H PRN Ainsley Spinner, PA-C   650 mg at 06/25/16 2238   Or  . acetaminophen (TYLENOL) suppository 650 mg  650 mg Rectal Q6H PRN Ainsley Spinner, PA-C      . carbidopa-levodopa (SINEMET IR) 25-100 MG per tablet immediate release 1 tablet  1 tablet Oral TID Molt, Bethany, DO   1 tablet at 06/26/16 0932  . enoxaparin (LOVENOX) injection 40 mg  40 mg Subcutaneous Q24H Ainsley Spinner, PA-C   40 mg at 06/26/16 0932  . levothyroxine (SYNTHROID, LEVOTHROID) tablet 50 mcg  50 mcg Oral QPM Molt, Bethany, DO   50 mcg at 06/25/16 1800  . menthol-cetylpyridinium (CEPACOL) lozenge 3 mg  1 lozenge Oral PRN Ainsley Spinner, PA-C       Or  .  phenol (CHLORASEPTIC) mouth spray 1 spray  1 spray Mouth/Throat PRN Ainsley Spinner, PA-C      . ondansetron [facility]) tablet 4 mg  4 mg Oral Q6H PRN Ainsley Spinner, PA-C       Or  . ondansetron [facility]) injection 4 mg  4 mg Intravenous Q6H PRN Ainsley Spinner, PA-C      . oxyCODONE-acetaminophen (PERCOCET/ROXICET) 5-325 MG per tablet 1 tablet  1 tablet Oral Q6H PRN Asencion Partridge, MD   1 tablet at 06/25/16 0031  . polyvinyl alcohol (LIQUIFILM TEARS) 1.4 % ophthalmic solution 1 drop  1 drop Both Eyes PRN Joni Reining C, DO      . potassium chloride SA (K-DUR,KLOR-CON) CR tablet 40 mEq  40 mEq Oral BID  Jule Ser, DO   40 mEq at 06/26/16 1145  . rosuvastatin (CRESTOR) tablet 10 mg  10 mg Oral QHS Molt, Bethany, DO   10 mg at 06/25/16 2239  . senna-docusate (Senokot-S) tablet 1 tablet  1 tablet Oral QHS Asencion Partridge, MD   1 tablet at 06/25/16 2239     Discharge Medications: Please see discharge summary for a list of discharge medications.  Relevant Imaging Results:  Relevant Lab Results:   Additional Information    Kyandre Okray M, LCSW

## 2016-06-26 NOTE — Clinical Social Work Note (Signed)
Clinical Social Work Assessment  Patient Details  Name: Abigail Taylor MRN: [id number] Date of Birth: [birth year]  Date of referral:  06/26/16               Reason for consult:  Facility Placement                Permission sought to share information with:  Chartered certified accountant granted to share information::  Yes, Verbal Permission Granted  Name::        Agency::     Relationship::     Contact Information:     Housing/Transportation Living arrangements for the past 2 months:  Single Family Home Source of Information:  Patient, Spouse, Adult Children Patient Interpreter Needed:  None Criminal Activity/Legal Involvement Pertinent to Current Situation/Hospitalization:  No - Comment as needed Significant Relationships:  Adult Children, Pets, Spouse Lives with:  Pets, Spouse Do you feel safe going back to the place where you live?  Yes Need for family participation in patient care:  No (Coment)  Care giving concerns:  Pt lives at home with her husband and their dog, Cissy.  Pt needs to be able to walk independently before she can return home, as she has limited physical support. Social Worker assessment / plan:  CSW met with pt, her husband, daughter and granddaughter to explain role of CSW/d/c planning.  Pt/family all agree that pt needed ST rehab before returning home to her PLOF.  Placement process explained, and family wishes to pursue SNF placement in [place] to begin bed search and facilitate NH tx as appropriate.  Employment status:  Retired Forensic scientist:  Medicare PT Recommendations:    Information / Referral to community resources:     Patient/Family's Response to care:  Agreeable to SNF placement and appreciative of CSW time/assistance. Patient/Family's Understanding of and Emotional Response to Diagnosis, Current Treatment, and Prognosis: Good.  Pt/family are hopeful that pt does well with therapy so that she can return home with  renewed independence. Emotional Assessment Appearance:    Attitude/Demeanor/Rapport:   (cooperative) Affect (typically observed):  Calm, Accepting, Appropriate Orientation:  Oriented to Self, Oriented to Place, Oriented to  Time, Oriented to Situation Alcohol / Substance use:  Not Applicable Psych involvement (Current and /or in the community):  No (Comment)  Discharge Needs  Concerns to be addressed:  Discharge Planning Concerns Readmission within the last 30 days:  No Current discharge risk:  None Barriers to Discharge:  No Barriers Identified   Tawyna Pellot, Miachel Roux, LCSW 06/26/2016, 3:42 PM

## 2016-06-26 NOTE — Progress Notes (Signed)
  Date: 06/26/2016  Patient name: Abigail Taylor  Medical record number: [id number]  Date of birth: [birth year]   I have seen and evaluated this patient and I have discussed the plan of care with the house staff. Please see Dr. Alcario Drought note for complete details. I concur with his findings.  She has a healing bruise over left face, no jaw pain or misalignment of jaw.  She will be treated for constipation.  Dispo to Rehab in 1-2 days if continues to do well.    Sid Falcon, MD 06/26/2016, 11:21 AM

## 2016-06-26 NOTE — Progress Notes (Addendum)
   Subjective:  Patient seen and examined this morning.  She is POD#2 for left femur fracture.   Her pain is adequately controlled.  She has worked with PT and OT who recommend SNF.  Both she and her husband are agreeable to this. No other complaints other than would like to have a bowel movement.  Reports using suppositories at home as needed.  Objective:  Vital signs in last 24 hours: Vitals:   06/25/16 0608 06/25/16 1312 06/25/16 2028 06/26/16 0436  BP: (!) 159/60 (!) 161/72 (!) 160/60 (!) 161/74  Pulse: 90 79 95 98  Resp:  15    Temp: 97.5 F (36.4 C) 97.1 F (36.2 C) 98.9 F (37.2 C) 98.2 F (36.8 C)  TempSrc: Oral Oral Oral Oral  SpO2: 98% 98% 98% 97%  Weight:      Height:       Constitutional: NAD, lying in bed comfortably, pleasant CV: Irregularly irregular rhythm, no murmurs, rubs or gallops appreciated Resp: CTAB anteriorly, no increased work of breathing GU: external urinary cath in place Msk: bandage C/D/I over surgical site.  Sensation intact over left & right extremity.  Pulses intact.  Warm and well perfused.  Assessment/Plan:  Principal Problem:   Closed subcapital fracture of left femur (HCC) Active Problems:   Paroxysmal atrial fibrillation (HCC)   Parkinson disease (HCC)   Hypokalemia   Tachy-brady syndrome (HCC)   Orthostatic hypotension due to Parkinson's disease ([place])  Closed subcapital fracture of left femur: - Pain well controlled. - POD#2 with Dr. Marcelino Scot of orthopedics - Pain control with Oxy-APAP 5-325mg  Q6H PRN - CSW consulted for SNF placement - PT/OT eval appreciated, recommending SNF - On Lovenox for DVT PPx - External urinary catheter in place until more mobile   PAF, tachy-brady syndrome: Normal rate, irregular rhythm. - holding Eliquis until tomorrow - diltiazem prn for tachycardia  Parkinson disease: --Sinmet 25-100mg  TID  Orthostatic hypotension: Patient has not been taking florinef at home; asymptomatic now. --can restart  florinef if needed  HTN Has been hypertensive since surgery although reports pain is adequately controlled.  Not on any scheduled anti-hypertensives at this time --continue to monitor   Hypokalemia: 3.0 this morning --Kdur 90mEq x 2 doses --BMET in AM  Constipation --glycerin suppository ordered --Senokot-S QHS  Dispo: Anticipated discharge in approximately 1-2 day(s).   Jule Ser, DO 06/26/2016, 11:00 AM Pager [phone]

## 2016-06-26 NOTE — Progress Notes (Signed)
SPORTS MEDICINE AND JOINT REPLACEMENT  Lara Mulch, MD    Carlyon Shadow, PA-C [place], [place], [place]  [id number]                             [phone]   PROGRESS NOTE  Subjective:  negative for Chest Pain  negative for Shortness of Breath  negative for Nausea/Vomiting   negative for Calf Pain  negative for Bowel Movement   Tolerating Diet: yes         Patient reports pain as 4 on 0-10 scale.    Objective: Vital signs in last 24 hours:   Patient Vitals for the past 24 hrs:  BP Temp Temp src Pulse Resp SpO2  06/26/16 0436 (!) 161/74 98.2 F (36.8 C) Oral 98 - 97 %  06/25/16 2028 (!) 160/60 98.9 F (37.2 C) Oral 95 - 98 %  06/25/16 1312 (!) 161/72 97.1 F (36.2 C) Oral 79 15 98 %    @flow {1959:LAST@   Intake/Output from previous day:   06/02 0701 - 06/03 0700 In: 200 [P.O.:200] Out: 3050 [Urine:3050]   Intake/Output this shift:   No intake/output data recorded.   Intake/Output      06/02 0701 - 06/03 0700 06/03 0701 - 06/04 0700   P.O. 200    I.V. (mL/kg)     IV Piggyback     Total Intake(mL/kg) 200 (3.4)    Urine (mL/kg/hr) 3050 (2.2)    Blood     Total Output 3050     Net -2850             LABORATORY DATA:  Recent Labs  06/23/16 1144 06/23/16 1200 06/24/16 0745 06/25/16 0624 06/26/16 0542  WBC 12.1*  --  8.8 8.1 9.3  HGB 13.8 13.9 13.6 11.7* 12.0  HCT 41.7 41.0 41.9 36.1 35.7*  PLT 171  --  148* 143* 164    Recent Labs  06/23/16 1144 06/23/16 1200 06/24/16 0745 06/25/16 0624 06/26/16 0542  NA 141 143 138 133* 139  K 3.2* 3.2* 4.1 3.6 3.0*  CL 109 105 106 99* 102  CO2 26  --  26 24 27   BUN 11 15 9 7  5*  CREATININE 0.68 0.60 0.61 0.67 0.55  GLUCOSE 106* 101* 116* 121* 127*  CALCIUM 8.3*  --  8.6* 8.3* 8.3*   Lab Results  Component Value Date   INR 1.12 06/24/2016   INR 1.15 05/07/2014   INR 0.94 07/04/2011    Examination:  General appearance: alert, cooperative and no distress Extremities: extremities  normal, atraumatic, no cyanosis or edema  Wound Exam: clean, dry, intact   Drainage:  None: wound tissue dry  Motor Exam: Quadriceps and Hamstrings Intact  Sensory Exam: Superficial Peroneal, Deep Peroneal and Tibial normal   Assessment:    2 Days Post-Op  Procedure(s) (LRB): ARTHROPLASTY BIPOLAR HIP (HEMIARTHROPLASTY) (Left)  ADDITIONAL DIAGNOSIS:  Principal Problem:   Closed subcapital fracture of left femur (HCC) Active Problems:   Paroxysmal atrial fibrillation (HCC)   Parkinson disease (HCC)   Hypokalemia   Tachy-brady syndrome (HCC)   Orthostatic hypotension due to Parkinson's disease ([place])     Plan: Physical Therapy as ordered Weight Bearing as Tolerated (WBAT)  DVT Prophylaxis:  Lovenox  DISCHARGE PLAN: Skilled Nursing Facility/Rehab  Patient doing well from an orthopedic standpoint, medicine following with recommended D/C in appx the next 1-2 days. Will continue to follow through weekend until D/C to  SNF         Donia Ast 06/26/2016, 7:28 AM

## 2016-06-26 NOTE — Clinical Social Work Placement (Signed)
   CLINICAL SOCIAL WORK PLACEMENT  NOTE  Date:  06/26/2016  Patient Details  Name: Abigail Taylor MRN: [id number] Date of Birth: 12/17/1931  Clinical Social Work is seeking post-discharge placement for this patient at the [place] level of care (*CSW will initial, date and re-position this form in  chart as items are completed):  Yes   Patient/family provided with [facility] Work Department's list of facilities offering this level of care within the geographic area requested by the patient (or if unable, by the patient's family).  Yes   Patient/family informed of their freedom to choose among providers that offer the needed level of care, that participate in Medicare, Medicaid or managed care program needed by the patient, have an available bed and are willing to accept the patient.  Yes   Patient/family informed of [facility]'s ownership interest in [facility] and [facility], as well as of the fact that they are under no obligation to receive care at these facilities.  PASRR submitted to EDS on 06/26/16     PASRR number received on       Existing PASRR number confirmed on       FL2 transmitted to all facilities in geographic area requested by pt/family on 06/26/16     FL2 transmitted to all facilities within larger geographic area on       Patient informed that his/her managed care company has contracts with or will negotiate with certain facilities, including the following:            Patient/family informed of bed offers received.  Patient chooses bed at       Physician recommends and patient chooses bed at      Patient to be transferred to   on  .  Patient to be transferred to facility by       Patient family notified on   of transfer.  Name of family member notified:        PHYSICIAN       Additional Comment:    _______________________________________________ Roanna Raider, LCSW 06/26/2016, 3:38 PM

## 2016-06-27 ENCOUNTER — Inpatient Hospital Stay
Admission: RE | Admit: 2016-06-27 | Discharge: 2016-07-17 | Disposition: A | Discharge status: Home / Self Care | Payer: Medicare Other | Source: Ambulatory Visit | Attending: Internal Medicine | Admitting: Internal Medicine

## 2016-06-27 ENCOUNTER — Encounter (HOSPITAL_COMMUNITY): Payer: Self-pay | Admitting: Orthopedic Surgery

## 2016-06-27 DIAGNOSIS — I351 Nonrheumatic aortic (valve) insufficiency: Secondary | ICD-10-CM | POA: Diagnosis not present

## 2016-06-27 DIAGNOSIS — S72009A Fracture of unspecified part of neck of unspecified femur, initial encounter for closed fracture: Secondary | ICD-10-CM | POA: Diagnosis not present

## 2016-06-27 DIAGNOSIS — I495 Sick sinus syndrome: Secondary | ICD-10-CM | POA: Diagnosis not present

## 2016-06-27 DIAGNOSIS — G903 Multi-system degeneration of the autonomic nervous system: Secondary | ICD-10-CM | POA: Diagnosis not present

## 2016-06-27 DIAGNOSIS — G8911 Acute pain due to trauma: Secondary | ICD-10-CM | POA: Diagnosis not present

## 2016-06-27 DIAGNOSIS — M6281 Muscle weakness (generalized): Secondary | ICD-10-CM | POA: Diagnosis not present

## 2016-06-27 DIAGNOSIS — Z96649 Presence of unspecified artificial hip joint: Secondary | ICD-10-CM | POA: Diagnosis not present

## 2016-06-27 DIAGNOSIS — D72829 Elevated white blood cell count, unspecified: Secondary | ICD-10-CM | POA: Diagnosis not present

## 2016-06-27 DIAGNOSIS — I480 Paroxysmal atrial fibrillation: Secondary | ICD-10-CM | POA: Diagnosis not present

## 2016-06-27 DIAGNOSIS — G20 Parkinson's disease: Secondary | ICD-10-CM | POA: Diagnosis not present

## 2016-06-27 DIAGNOSIS — D473 Essential (hemorrhagic) thrombocythemia: Secondary | ICD-10-CM | POA: Diagnosis not present

## 2016-06-27 DIAGNOSIS — R279 Unspecified lack of coordination: Secondary | ICD-10-CM | POA: Diagnosis not present

## 2016-06-27 DIAGNOSIS — R41841 Cognitive communication deficit: Secondary | ICD-10-CM | POA: Diagnosis not present

## 2016-06-27 DIAGNOSIS — E039 Hypothyroidism, unspecified: Secondary | ICD-10-CM | POA: Diagnosis not present

## 2016-06-27 DIAGNOSIS — E876 Hypokalemia: Secondary | ICD-10-CM | POA: Diagnosis not present

## 2016-06-27 DIAGNOSIS — S72012D Unspecified intracapsular fracture of left femur, subsequent encounter for closed fracture with routine healing: Secondary | ICD-10-CM

## 2016-06-27 DIAGNOSIS — K5900 Constipation, unspecified: Secondary | ICD-10-CM | POA: Diagnosis not present

## 2016-06-27 DIAGNOSIS — I10 Essential (primary) hypertension: Secondary | ICD-10-CM | POA: Diagnosis not present

## 2016-06-27 DIAGNOSIS — Z9181 History of falling: Secondary | ICD-10-CM | POA: Diagnosis not present

## 2016-06-27 DIAGNOSIS — E785 Hyperlipidemia, unspecified: Secondary | ICD-10-CM | POA: Diagnosis not present

## 2016-06-27 DIAGNOSIS — Z950 Presence of cardiac pacemaker: Secondary | ICD-10-CM | POA: Diagnosis not present

## 2016-06-27 LAB — BASIC METABOLIC PANEL
ANION GAP: 6 (ref 5–15)
BUN: 6 mg/dL (ref 6–20)
CALCIUM: 8.4 mg/dL — AB (ref 8.9–10.3)
CO2: 27 mmol/L (ref 22–32)
Chloride: 106 mmol/L (ref 101–111)
Creatinine, Ser: 0.53 mg/dL (ref 0.44–1.00)
GLUCOSE: 101 mg/dL — AB (ref 65–99)
Potassium: 3.9 mmol/L (ref 3.5–5.1)
Sodium: 139 mmol/L (ref 135–145)

## 2016-06-27 LAB — CBC
HCT: 35.2 % — ABNORMAL LOW (ref 36.0–46.0)
Hemoglobin: 11.7 g/dL — ABNORMAL LOW (ref 12.0–15.0)
MCH: 28.3 pg (ref 26.0–34.0)
MCHC: 33.2 g/dL (ref 30.0–36.0)
MCV: 85 fL (ref 78.0–100.0)
PLATELETS: 173 10*3/uL (ref 150–400)
RBC: 4.14 MIL/uL (ref 3.87–5.11)
RDW: 13.1 % (ref 11.5–15.5)
WBC: 8 10*3/uL (ref 4.0–10.5)

## 2016-06-27 MED ORDER — ACETAMINOPHEN 325 MG PO TABS: 650.0000 mg | ORAL_TABLET | Freq: Four times a day (QID) | ORAL | Status: DC | PRN

## 2016-06-27 MED ORDER — POLYVINYL ALCOHOL 1.4 % OP SOLN
1.0000 [drp] | Freq: Two times a day (BID) | OPHTHALMIC | Status: DC
  Administered 2016-06-27: 1 [drp] via OPHTHALMIC
  Filled 2016-06-27: qty 15

## 2016-06-27 MED ORDER — SENNOSIDES-DOCUSATE SODIUM 8.6-50 MG PO TABS: 1.0000 | ORAL_TABLET | Freq: Every evening | ORAL | Status: DC | PRN

## 2016-06-27 MED ORDER — APIXABAN 2.5 MG PO TABS: 2.5000 mg | ORAL_TABLET | Freq: Two times a day (BID) | ORAL | Status: DC

## 2016-06-27 NOTE — Social Work (Signed)
Clinical Social Worker facilitated patient discharge including contacting patient family and facility to confirm patient discharge plans.  Clinical information faxed to facility and family agreeable with plan.  CSW arranged ambulance transport via [facility] to [facility].  RN to call [phone] report prior to discharge.  Clinical Social Worker will sign off for now as social work intervention is no longer needed. Please consult [place] again if new need arises.  Elissa Hefty, LCSW Clinical Social Worker [phone]

## 2016-06-27 NOTE — Discharge Summary (Signed)
Name: Abigail Taylor MRN: [id number] DOB: Jun 14, 1931 83 y.o. PCP: [name], MD  Date of Admission: 06/23/2016  9:37 AM Date of Discharge: 06/27/2016 Attending Physician: Lucious Groves, DO  Discharge Diagnosis: 1. Closed subcapital fracture of left femur Principal Problem:   Closed subcapital fracture of left femur (HCC) Active Problems:   Paroxysmal atrial fibrillation (HCC)   HTN (hypertension)   Constipation   Parkinson disease (HCC)   Hypokalemia   Tachy-brady syndrome (HCC)   Orthostatic hypotension due to Parkinson's disease [facility])   Discharge Medications: Allergies as of 06/27/2016   No Known Allergies     Medication List    STOP taking these medications   fludrocortisone 0.1 MG tablet Commonly known as:  FLORINEF     TAKE these medications   acetaminophen 325 MG tablet Commonly known as:  TYLENOL Take 2 tablets (650 mg total) by mouth every 6 (six) hours as needed for mild pain (or Fever >/= 101).   carbidopa-levodopa 25-100 MG tablet Commonly known as:  SINEMET IR Take 1 tablet by mouth 3 (three) times daily.   diltiazem 30 MG tablet Commonly known as:  CARDIZEM Take 1 tablet (30 mg total) by mouth daily as needed (for rapid palpitations only).   ELIQUIS 2.5 MG Tabs tablet Generic drug:  apixaban TAKE (1) TABLET BY MOUTH TWICE DAILY.   hydroxypropyl methylcellulose / hypromellose 2.5 % ophthalmic solution Commonly known as:  ISOPTO TEARS / GONIOVISC Place 1 drop into both eyes as needed for dry eyes (uses ointment).   levothyroxine 50 MCG tablet Commonly known as:  SYNTHROID, LEVOTHROID Take 50 mcg by mouth every evening.   rosuvastatin 10 MG tablet Commonly known as:  CRESTOR Take 10 mg by mouth at bedtime. Reported on 07/09/2015   senna-docusate 8.6-50 MG tablet Commonly known as:  Senokot-S Take 1 tablet by mouth at bedtime as needed for mild constipation.   XIIDRA 5 % Soln Generic drug:  Lifitegrast Apply 1 drop to eye every  evening. Both eyes       Disposition and follow-up:   Abigail Taylor was discharged from [facility] in Good condition.  At the hospital follow up visit please address:  1.   Femoral fracture: S/p L hip hemiarthroplasty Continue regular dressing changes Continue working with PT/OT for increasing mobility and independence  Orthostatic hypotension: In setting of Parkinson disease; patient has not been taking Florinef at home so was held as not necessary inpatient. Has not been ordered on discharge but may need restart in future.   PAF: Patient was on Lovenox post-op. Restart home eliquis day after discharge.  Hypokalemia: Patient with hypok recurrently but does not want any more medicines --repeat Bmet at follow up; continue addressing repletion at home  2.  Labs / imaging needed at time of follow-up: bmet  3.  Pending labs/ test needing follow-up: none  Follow-up Appointments:  Contact information for follow-up providers    Altamese Davidson, MD. Schedule an appointment as soon as possible for a visit in 10 day(s).   Specialty:  Orthopedic Surgery Contact information: [place] [id number] [phone]        [name], MD. Schedule an appointment as soon as possible for a visit in 2 week(s).   Specialty:  Internal Medicine Contact information: [address] [place] [address] [phone]            Contact information for after-discharge care    Destination  [facility] Follow up.   Specialty:  Skilled Nursing Facility Contact information: [address] [place] [facility] Course by problem list: Principal Problem:   Closed subcapital fracture of left femur ([place]) Active Problems:   Paroxysmal atrial fibrillation (HCC)   HTN (hypertension)   Constipation   Parkinson disease (HCC)   Hypokalemia    Tachy-brady syndrome (HCC)   Orthostatic hypotension due to Parkinson's disease ([place])   Closed subcapital fracture of left femur: Patient presented to [facility] following fall from bed with pain in her left hip; she was found to have a closed subcapital fracture of her left femur. Orthopedics evaluated patient and performed left femoral arthroplasty on 06/24/16 after which patient's pain was significantly improved. She has been evaluated by PT/OT and recommended for a SNF stay for further rehabilitation to increase her mobility an independence. She only required PRN acetaminophen post op to control pain. She did have a low grade fever day of discharge; however she was asymptomatic and had no signs of infection or VTE which is likely related to atelectasis due to several day hospital stay and decreased mobility.  Urinary retention: Shortly after admission, patient experienced mild urinary retention for which a foley catheter was placed temporarily; she has not had problems with retention before and was likely related to narcotics administered in the hospital to control her pain. She was successfully transitioned to external cath post op for comfort while she was regaining mobility.  PAF, tachy-brady syndrome: Patient with dual chamber pacer; she was on PRN diltiazem for tachycardia, and eliquis 2.5mg  BID for anticoagulation. Eliquis was held prior to surgery and was placed on SCDs; immediately post-op she was placed on lovenox and was discharged on her home eliquis to start day after discharge. She had no signs or symptoms of bleeding or VTE throughout her admission.   Parkinson disease, orthostatic hypotension: Patient was continued on her home Sinemet for Parkinson disease. She was supposed to be on florinef for orthostatic hypotension in setting of autonomic dysregulation with Parkinson disease but had not been on it for over a month due to preference so this was held. She did not require re-starting of  florinef during hospitalization.   Hypothyroidism: Patient continued on her home synthroid 57mcg daily.   Hypokalemia: Patient with persistent hypokalemia also noted during her cardiology appointment; patient was repleted in the hospital but is not interested in taking any more medicines regularly.  Discharge Vitals:   BP (!) 125/55 (BP Location: Left Arm)   Pulse 75   Temp 100.1 F (37.8 C) (Oral)   Resp 16   Ht 5\' 6"  (1.676 m)   Wt 130 lb (59 kg)   SpO2 97%   BMI 20.98 kg/m   Pertinent Labs, Studies, and Procedures:  CBC Latest Ref Rng & Units 06/27/2016 06/26/2016 06/25/2016  WBC 4.0 - 10.5 K/uL 8.0 9.3 8.1  Hemoglobin 12.0 - 15.0 g/dL 11.7(L) 12.0 11.7(L)  Hematocrit 36.0 - 46.0 % 35.2(L) 35.7(L) 36.1  Platelets 150 - 400 K/uL 173 164 143(L)   BMP Latest Ref Rng & Units 06/27/2016 06/26/2016 06/25/2016  Glucose 65 - 99 mg/dL 101(H) 127(H) 121(H)  BUN 6 - 20 mg/dL 6 5(L) 7  Creatinine 0.44 - 1.00 mg/dL 0.53 0.55 0.67  Sodium 135 - 145 mmol/L 139 139 133(L)  Potassium 3.5 - 5.1 mmol/L 3.9 3.0(L) 3.6  Chloride  101 - 111 mmol/L 106 102 99(L)  CO2 22 - 32 mmol/L 27 27 24   Calcium 8.9 - 10.3 mg/dL 8.4(L) 8.3(L) 8.3(L)   Hip and pelvis xray 06/23/2016: Acute subcapital left hip fracture with angulation and foreshortening of the leg.  Knee left x ray 06/23/2016: No acute bony abnormality of the left knee  Hip and pelvis xray 06/24/2016: Left hip replacement and osseus demineralization without acute bony abnormalities.  Discharge Instructions: Discharge Instructions    Call MD for:  difficulty breathing, headache or visual disturbances    Complete by:  As directed    Call MD for:  extreme fatigue    Complete by:  As directed    Call MD for:  hives    Complete by:  As directed    Call MD for:  persistant dizziness or light-headedness    Complete by:  As directed    Call MD for:  persistant nausea and vomiting    Complete by:  As directed    Call MD for:  redness, tenderness, or  signs of infection (pain, swelling, redness, odor or green/yellow discharge around incision site)    Complete by:  As directed    Call MD for:  severe uncontrolled pain    Complete by:  As directed    Call MD for:  temperature >100.4    Complete by:  As directed    Diet - low sodium heart healthy    Complete by:  As directed    Discharge instructions    Complete by:  As directed    Femoral fracture: S/p L hip hemiarthroplasty Continue regular dressing changes Continue working with PT/OT for increasing mobility and independence   Please make an appointment to see the orthopedic doctor (info attached) to be seen in about 10 days. Please make an appointment to be seen by your primary care provider in about 2 weeks. Orthostatic hypotension: In setting of Parkinson disease; patient has not been taking Florinef at home so was held as not necessary inpatient. Has not been ordered on discharge but may need restart in future.   PAF: Patient was on Lovenox post-op. Restart home eliquis day after discharge.   Increase activity slowly    Complete by:  As directed    Posterior total hip precautions    Complete by:  As directed    Left hip   Weight bearing as tolerated    Complete by:  As directed    Laterality:  left   Extremity:  Lower      Signed: Alphonzo Grieve, MD 06/27/2016, 2:45 PM   Pager [phone]

## 2016-06-27 NOTE — Discharge Instructions (Signed)
Orthopaedic Trauma Service Discharge Instructions   General Discharge Instructions  WEIGHT BEARING STATUS: weightbearing as tolerated left leg  RANGE OF MOTION/ACTIVITY: posterior hip precautions left hip  Wound Care: daily wound care as needed. Once wound is dry, leave open to air.  See detailed instructions below  Discharge Wound Care Instructions  Do NOT apply any ointments, solutions or lotions to pin sites or surgical wounds.  These prevent needed drainage and even though solutions like hydrogen peroxide kill bacteria, they also damage cells lining the pin sites that help fight infection.  Applying lotions or ointments can keep the wounds moist and can cause them to breakdown and open up as well. This can increase the risk for infection. When in doubt call the office.  Surgical incisions should be dressed daily.  If any drainage is noted, use one layer of adaptic, then gauze, Kerlix, and an ace wrap.  Once the incision is completely dry and without drainage, it may be left open to air out.  Showering may begin 36-48 hours later.  Cleaning gently with soap and water.  Traumatic wounds should be dressed daily as well.    One layer of adaptic, gauze, Kerlix, then ace wrap.  The adaptic can be discontinued once the draining has ceased    If you have a wet to dry dressing: wet the gauze with saline the squeeze as much saline out so the gauze is moist (not soaking wet), place moistened gauze over wound, then place a dry gauze over the moist one, followed by Kerlix wrap, then ace wrap.  PAIN MEDICATION USE AND EXPECTATIONS  You have likely been given narcotic medications to help control your pain.  After a traumatic event that results in an fracture (broken bone) with or without surgery, it is ok to use narcotic pain medications to help control one's pain.  We understand that everyone responds to pain differently and each individual patient will be evaluated on a regular basis for the  continued need for narcotic medications. Ideally, narcotic medication use should last no more than 6-8 weeks (coinciding with fracture healing).   As a patient it is your responsibility as well to monitor narcotic medication use and report the amount and frequency you use these medications when you come to your office visit.   We would also advise that if you are using narcotic medications, you should take a dose prior to therapy to maximize you participation.  IF YOU ARE ON NARCOTIC MEDICATIONS IT IS NOT PERMISSIBLE TO OPERATE A MOTOR VEHICLE (MOTORCYCLE/CAR/TRUCK/MOPED) OR HEAVY MACHINERY DO NOT MIX NARCOTICS WITH OTHER CNS (CENTRAL NERVOUS SYSTEM) DEPRESSANTS SUCH AS ALCOHOL  Diet: as you were eating previously.  Can use over the counter stool softeners and bowel preparations, such as Miralax, to help with bowel movements.  Narcotics can be constipating.  Be sure to drink plenty of fluids    STOP SMOKING OR USING NICOTINE PRODUCTS!!!!  As discussed nicotine severely impairs your body's ability to heal surgical and traumatic wounds but also impairs bone healing.  Wounds and bone heal by forming microscopic blood vessels (angiogenesis) and nicotine is a vasoconstrictor (essentially, shrinks blood vessels).  Therefore, if vasoconstriction occurs to these microscopic blood vessels they essentially disappear and are unable to deliver necessary nutrients to the healing tissue.  This is one modifiable factor that you can do to dramatically increase your chances of healing your injury.    (This means no smoking, no nicotine gum, patches, etc)  DO NOT USE NONSTEROIDAL  ANTI-INFLAMMATORY DRUGS (NSAID'S)  Using products such as Advil (ibuprofen), Aleve (naproxen), Motrin (ibuprofen) for additional pain control during fracture healing can delay and/or prevent the healing response.  If you would like to take over the counter (OTC) medication, Tylenol (acetaminophen) is ok.  However, some narcotic medications  that are given for pain control contain acetaminophen as well. Therefore, you should not exceed more than 4000 mg of tylenol in a day if you do not have liver disease.  Also note that there are may OTC medicines, such as cold medicines and allergy medicines that my contain tylenol as well.  If you have any questions about medications and/or interactions please ask your doctor/PA or your pharmacist.      ICE AND ELEVATE INJURED/OPERATIVE EXTREMITY  Using ice and elevating the injured extremity above your heart can help with swelling and pain control.  Icing in a pulsatile fashion, such as 20 minutes on and 20 minutes off, can be followed.    Do not place ice directly on skin. Make sure there is a barrier between to skin and the ice pack.    Using frozen items such as frozen peas works well as the conform nicely to the are that needs to be iced.  USE AN ACE WRAP OR TED HOSE FOR SWELLING CONTROL  In addition to icing and elevation, Ace wraps or TED hose are used to help limit and resolve swelling.  It is recommended to use Ace wraps or TED hose until you are informed to stop.    When using Ace Wraps start the wrapping distally (farthest away from the body) and wrap proximally (closer to the body)   Example: If you had surgery on your leg or thing and you do not have a splint on, start the ace wrap at the toes and work your way up to the thigh        If you had surgery on your upper extremity and do not have a splint on, start the ace wrap at your fingers and work your way up to the upper arm  IF YOU ARE IN A SPLINT OR CAST DO NOT [place]   If your splint gets wet for any reason please contact the office immediately. You may shower in your splint or cast as long as you keep it dry.  This can be done by wrapping in a cast cover or garbage back (or similar)  Do Not stick any thing down your splint or cast such as pencils, money, or hangers to try and scratch yourself with.  If you feel  itchy take benadryl as prescribed on the bottle for itching  IF YOU ARE IN A CAM BOOT (BLACK BOOT)  You may remove boot periodically. Perform daily dressing changes as noted below.  Wash the liner of the boot regularly and wear a sock when wearing the boot. It is recommended that you sleep in the boot until told otherwise  CALL THE OFFICE WITH ANY QUESTIONS OR CONCERNS: [phone]

## 2016-06-27 NOTE — Progress Notes (Signed)
Report called to Bishop Dublin, LPN at [facility].  PTAR scheduled for pick up at 1630.  IV removed.  Patient waiting with husband.  No complaints of pain at this time.  Will continue to monitor.

## 2016-06-27 NOTE — Op Note (Signed)
NAME:  Abigail Taylor                    ACCOUNT NO.:  MEDICAL RECORD NO.:  [id number]  LOCATION:                                 FACILITY:  PHYSICIAN:  Astrid Divine. Marcelino Scot, M.D.      DATE OF BIRTH:  DATE OF PROCEDURE:  06/24/2016 DATE OF DISCHARGE:                              OPERATIVE REPORT   PREOPERATIVE DIAGNOSIS:  Displaced left femoral neck fracture.  POSTOPERATIVE DIAGNOSIS:  Displaced left femoral neck fracture.  PROCEDURE:  Left hip hemiarthroplasty using [place] [place] basic #5 stem, standard neck, 49 mm unipolar head.  SURGEON:  Astrid Divine. Marcelino Scot, M.D.  ASSISTANT:  Surgical tech.  ANESTHESIA:  General.  COMPLICATIONS:  None.  I/O:  1800 mL of crystalloid, 500 mL of colloid, UOP 275 mL, EBL150 mL.  DRAINS:  None.  DISPOSITION:  PACU.  CONDITION:  Stable.  BRIEF SUMMARY AND INDICATION FOR PROCEDURE:  Abigail Taylor is a pleasant 83 year old female, who sustained a fall from her bed resulting in acute pain in the left hip, inability to ambulate.  I discussed with her the risks and benefits of surgical treatment with hemiarthroplasty or arthroplasty and because of her low level of activity and frequent sitting, recommended hemiarthroplasty.  We specifically discussed instability, potential for arthritis, and subsequent conversion to total hip arthroplasty, DVT, PE, loss of motion, heart attack, stroke, bleeding, and others.  After full discussion, both her and her husband acknowledged these risks and did wish to proceed.  DESCRIPTION OF PROCEDURE:  The patient was taken to the operating room where general anesthesia was induced.  She did receive preoperative antibiotics.  She was positioned left side up and a chlorhexidine scrub was performed followed by standard Betadine scrub and paint.  Sterile drapes were then applied.  Time-out was held.  A curvilinear incision was made centered over the proximal trochanter.  Dissection was carried down to the tensor, which  was split in line with the skin incision.  The short rotators were identified, mobilized, and tagged near their insertion with #2 FiberWire.  These were then reflected to reveal the capsule, which was T'd from the neck of the femur back to the acetabulum.  The edges were secured with #1 Vicryl.  This was followed by refining the neck cut with a saw and osteotome removing debride with rongeur.  The head was then removed using the corkscrew and checked for size using the template.  This measured 49 mm.  A trial head was then inserted into the acetabulum giving excellent fit and fill.  Attention was then turned to proximal femur where the Mueller retractor was placed underneath the neck for elevation.  My assistant held the hip in internal rotation and adduction helping to deliver the proximal aspect for preparation of the canal.  In sequential fashion, we used the [employer], box cutter, Lateralizer, sequential reaming up to 5, and then sequential broaching up to 5, getting excellent fit and fill with no rotational instability or looseness.  I was able to just seat it to the appropriate level.  Trial components were then inserted using standard neck and the head size referenced above.  The hip was  relocated gently and then assess for range of motion, finding excellent stability and flexion, adduction and internal rotation as well as in external rotation and extension.  The trial components were dislocated once more. The proximal femur and acetabulum irrigated thoroughly and cleaned of all potential bone debris.  Then, the actual components were seated into position and the [facility] taper engaged.  They were then located and once more the hip was taken through a range of motion again finding outstanding stability and the parameters mentioned above.  Wounds were irrigated and then the capsule repaired using figure-of-eight #1 Vicryl. The short rotators were repaired back through bone tunnels  with #2 FiberWire and then the tensor with #1 Vicryl, deep subcutaneous with 0 Vicryl, 2-0 Vicryl for the shallow subcutaneous, and 3-0 nylon for the skin.  Sterile gently compressive dressing was applied.  The patient was then awakened from anesthesia and transported to PACU in stable condition.  The procedure did require an assistant at all times to control the hip during preparation as well as dislocation and relocation of the trial and actual components.  PROGNOSIS:  Abigail Taylor will be weightbearing as tolerated on the left lower extremity with posterior hip precautions.  We will restart her Lovenox tomorrow and Eliquis will be resumed on Monday.  She will work with PT and OT.  She will be managed by the Medical Service and we would expect a followup after discharge in 2 weeks for removal of sutures.     Astrid Divine. Marcelino Scot, M.D.     MHH/MEDQ  D:  06/24/2016  T:  06/24/2016  Job:  [id number]

## 2016-06-27 NOTE — Progress Notes (Signed)
Internal Medicine Attending:   I saw and examined the patient. I reviewed the resident's note and I agree with the resident's findings and plan as documented in the resident's note. She has had some intermittent HTN while inpatient, given her history of parkinson's and orthostatic hypotension I would not want to treat this aggressively inpatient, amlodipine was ordered last night by coverage but not given, I would continue to hold off treatment, she is perfectly normotensive this morning.

## 2016-06-27 NOTE — Progress Notes (Signed)
   Subjective:  Patient states she has no pain and has not required pain medicine. She is eager to work with PT to increase her mobility. She denies fevers, chills, urinary symptoms, cough, chest pain, shortness of breath. Her appetite is still poor though she admits it's only fair at home.   Objective:  Vital signs in last 24 hours: Vitals:   06/26/16 1527 06/26/16 2147 06/26/16 2333 06/27/16 0652  BP: (!) 157/64 (!) 173/66 (!) 143/66 124/68  Pulse: 93 (!) 116  82  Resp: 16 16  16   Temp: 98 F (36.7 C) 98.6 F (37 C)  100.1 F (37.8 C)  TempSrc: Oral Oral  Oral  SpO2: 98% 97%    Weight:      Height:       Constitutional: NAD, lying in bed comfortably eating breakfast, pleasant CV: Irregularly irregular rhythm, no murmurs, rubs or gallops appreciated Resp: CTAB anteriorly, no increased work of breathing GU: external urinary cath in place Msk: extremities warm with pulses intact; patient able to move both LE  Assessment/Plan:  Principal Problem:   Closed subcapital fracture of left femur (HCC) Active Problems:   Paroxysmal atrial fibrillation (HCC)   HTN (hypertension)   Constipation   Parkinson disease (HCC)   Hypokalemia   Tachy-brady syndrome (HCC)   Orthostatic hypotension due to Parkinson's disease (HCC)  Closed subcapital fracture of left femur: POD#3 with Dr. Marcelino Scot of orthopedics; pain well controlled - patient not requiring PRN Oxy-APAP 5-325mg . She does have a low grade fever (100.68F) this morning with no signs or symptoms of infection; she has not been using her incentive spirometer correctly. --instructed patient in using incentive spirometry - continue monitoring for signs/symptoms of infection; if fever spikes will work up further. --CSW working on SNF placement --continue working with PT while inpatient - appreciate their help --Changing from lovenox back to home Eliquis today --External urinary catheter in place until more mobile   PAF, tachy-brady  syndrome: Normal rate, irregular rhythm. --restart eliquis tomorrow - already received lovenox early this AM --diltiazem prn for tachycardia  Parkinson disease: --Sinmet 25-100mg  TID  Orthostatic hypotension: Patient has not been taking florinef at home; asymptomatic now. --can restart florinef if needed  HTN: Normotensive this AM; has been hypertensive since surgery despite good pain control. No history of HTN. --continue to monitor   Hypokalemia:  Resolved.  Constipation: Patient had adequate BMs yesterday. --Senokot-S QHS  Dispo: Anticipated discharge upon SNF placement - otherwise medically stable for discharge.   Alphonzo Grieve, MD 06/27/2016, 8:31 AM Pager [phone]

## 2016-06-27 NOTE — Progress Notes (Signed)
Orthopedic Trauma Service Progress Note    Subjective:  Appears to be doing well Sat on EOB over weekend  Hoping to get to chair today   Review of Systems  Respiratory: Negative for shortness of breath and wheezing.   Cardiovascular: Negative for chest pain and palpitations.  Gastrointestinal: Negative for abdominal pain, nausea and vomiting.    Objective:   VITALS:   Vitals:   06/26/16 2147 06/26/16 2333 06/27/16 0652 06/27/16 1142  BP: (!) 173/66 (!) 143/66 124/68 (!) 125/55  Pulse: (!) 116  82 75  Resp: 16  16   Temp: 98.6 F (37 C)  100.1 F (37.8 C)   TempSrc: Oral  Oral   SpO2: 97%     Weight:      Height:        Intake/Output      06/03 0701 - 06/04 0700 06/04 0701 - 06/05 0700   P.O. 360    Total Intake(mL/kg) 360 (6.1)    Urine (mL/kg/hr) 1000 (0.7) 225 (0.7)   Stool 0 (0) 0 (0)   Total Output 1000 225   Net -640 -225        Urine Occurrence 3 x    Stool Occurrence 3 x 1 x     LABS  Results for orders placed or performed during the hospital encounter of 06/23/16 (from the past 24 hour(s))  CBC     Status: Abnormal   Collection Time: 06/27/16  5:01 AM  Result Value Ref Range   WBC 8.0 4.0 - 10.5 K/uL   RBC 4.14 3.87 - 5.11 MIL/uL   Hemoglobin 11.7 (L) 12.0 - 15.0 g/dL   HCT 35.2 (L) 36.0 - 46.0 %   MCV 85.0 78.0 - 100.0 fL   MCH 28.3 26.0 - 34.0 pg   MCHC 33.2 30.0 - 36.0 g/dL   RDW 13.1 11.5 - 15.5 %   Platelets 173 150 - 400 K/uL  Basic metabolic panel     Status: Abnormal   Collection Time: 06/27/16  5:01 AM  Result Value Ref Range   Sodium 139 135 - 145 mmol/L   Potassium 3.9 3.5 - 5.1 mmol/L   Chloride 106 101 - 111 mmol/L   CO2 27 22 - 32 mmol/L   Glucose, Bld 101 (H) 65 - 99 mg/dL   BUN 6 6 - 20 mg/dL   Creatinine, Ser 0.53 0.44 - 1.00 mg/dL   Calcium 8.4 (L) 8.9 - 10.3 mg/dL   GFR calc non Af Amer >60 >60 mL/min   GFR calc Af Amer >60 >60 mL/min   Anion gap 6 5 - 15     PHYSICAL EXAM:   Gen: awake,  alert, appears comfortable Lungs: breathing unlabored Ext:       Left Lower Extremity    Dressing c/d/i  Ext warm  +  DP pulse  Motor and sensory functions grossly intact    Assessment/Plan: 3 Days Post-Op   Principal Problem:   Closed subcapital fracture of left femur (HCC) Active Problems:   Paroxysmal atrial fibrillation (HCC)   HTN (hypertension)   Constipation   Parkinson disease (HCC)   Hypokalemia   Tachy-brady syndrome (HCC)   Orthostatic hypotension due to Parkinson's disease (HCC)   Anti-infectives    Start     Dose/Rate Route Frequency Ordered Stop   06/24/16 2000  ceFAZolin (ANCEF) IVPB 2g/100 mL premix     2 g 200 mL/hr over 30 Minutes Intravenous Every 6 hours 06/24/16 1724 06/25/16 0230    .  POD/HD#: 28  83 y/o female s/p fall out of bed with L subcapital femoral neck fracture  -L subcapital femoral neck fracture s/p L hip hemiarthroplasty   WBAT  Posterior hip precautions  PT/OT   Dressing changes as needed  Ice prn     - Pain management:  Tylenol and low dose narcotics as needed  - ABL anemia/Hemodynamics  Stable  - Medical issues   Per medicine  - DVT/PE prophylaxis:  Currently on Lovenox  Will restart eliquis tomorrow, pt on chronically for afib    - ID:   periop abx completed   - Activity:  Up with therapies  WBAT L leg    - Dispo:  PT/OT  SNF   Ortho issues stable  Follow up in 10-14 days      Jari Pigg, PA-C [facility] [phone] (P) [phone] (O) 06/27/2016, 12:15 PM

## 2016-06-27 NOTE — Progress Notes (Signed)
Physical Therapy Treatment Patient Details Name: Abigail Taylor MRN: [id number] DOB: [birth year] Today's Date: 06/27/2016    History of Present Illness Pt is a 83 y.o. female s/p L hip hemiarthroplasty. PMHx: Afib, Brady-tachy syndrome, Chronic back pain, OA, Parkinson's Disease, Dementia.    PT Comments    Pt is making good progress towards her goals. Pt is minA for bed mobility, minAx2 for transfer with RW and minA for ambulation of 12 feet with RW. Pt requires skilled PT to progress gait training and to improve LE strength and endurance to be safe in her discharge environment.    Follow Up Recommendations  SNF;Supervision/Assistance - 24 hour     Equipment Recommendations  Other (comment) (to be determined at next venue)    Recommendations for Other Services OT consult     Precautions / Restrictions Precautions Precautions: Fall Restrictions Weight Bearing Restrictions: Yes LLE Weight Bearing: Weight bearing as tolerated    Mobility  Bed Mobility Overal bed mobility: Needs Assistance Bed Mobility: Supine to Sit     Supine to sit: Min assist     General bed mobility comments: minAx1 for management of LE to floor vc for hand placement and for scooting to the EoB  Transfers Overall transfer level: Needs assistance Equipment used: Rolling walker (2 wheeled) Transfers: Sit to/from Stand Sit to Stand: Min assist;+2 safety/equipment         General transfer comment: minAx2 for steadying at upright vc for proper hand placement and anterior pelvic tilt to come all the way up  Ambulation/Gait Ambulation/Gait assistance: Min assist;+2 safety/equipment (chair follow) Ambulation Distance (Feet): 12 Feet Assistive device: Rolling walker (2 wheeled) Gait Pattern/deviations: Step-through pattern;Decreased step length - right;Decreased step length - left;Decreased weight shift to left Gait velocity: slowed Gait velocity interpretation: Below normal speed for  age/gender General Gait Details: decreased step length but slow steady cadance minA for steadying and close chair follow     Balance Overall balance assessment: Needs assistance;History of Falls Sitting-balance support: Feet supported Sitting balance-Leahy Scale: Good Sitting balance - Comments: able to sit EoB with only feet supported    Standing balance support: Bilateral upper extremity supported Standing balance-Leahy Scale: Poor Standing balance comment: minA for steadying with RW                            Cognition Arousal/Alertness: Awake/alert Behavior During Therapy: WFL for tasks assessed/performed Overall Cognitive Status: Within Functional Limits for tasks assessed                                 General Comments: Pt appears to be at baseline dementia according to husband             Pertinent Vitals/Pain Pain Assessment: 0-10 Faces Pain Scale: Hurts even more Pain Location: L hip with movement Pain Descriptors / Indicators: Grimacing;Guarding Pain Intervention(s): Limited activity within patient's tolerance;Monitored during session;Repositioned  VSS           PT Goals (current goals can now be found in the care plan section) Acute Rehab PT Goals Patient Stated Goal: decrease pain PT Goal Formulation: With patient Time For Goal Achievement: 07/09/16 Potential to Achieve Goals: Fair Progress towards PT goals: Progressing toward goals    Frequency    Min 3X/week      PT Plan Current plan remains appropriate       AM-PAC PT "  6 Clicks" Daily Activity  Outcome Measure  Difficulty turning over in bed (including adjusting bedclothes, sheets and blankets)?: A Lot Difficulty moving from lying on back to sitting on the side of the bed? : A Lot Difficulty sitting down on and standing up from a chair with arms (e.g., wheelchair, bedside commode, etc,.)?: Total Help needed moving to and from a bed to chair (including a  wheelchair)?: A Little Help needed walking in hospital room?: A Little Help needed climbing 3-5 steps with a railing? : Total 6 Click Score: 12    End of Session Equipment Utilized During Treatment: Gait belt Activity Tolerance: Patient tolerated treatment well Patient left: in bed;with call bell/phone within reach;with family/visitor present Nurse Communication: Mobility status;Weight bearing status PT Visit Diagnosis: Other abnormalities of gait and mobility (R26.89);History of falling (Z91.81);Muscle weakness (generalized) (M62.81);Pain;Other symptoms and signs involving the nervous system (R29.898) Pain - Right/Left: Left Pain - part of body: Hip     Time: 0037-0488 PT Time Calculation (min) (ACUTE ONLY): 17 min  Charges:  $Gait Training: 8-22 mins                    G Codes:       Abigail Taylor B. Migdalia Dk PT, DPT Acute Rehabilitation  [phone] Pager [phone]     [name] 06/27/2016, 5:14 PM

## 2016-06-27 NOTE — Care Management Important Message (Signed)
Important Message  Patient Details  Name: Abigail Taylor MRN: [id number] Date of Birth: [birth year]   Medicare Important Message Given:  Yes    [name] 06/27/2016, 12:11 PM

## 2016-06-28 ENCOUNTER — Non-Acute Institutional Stay (SKILLED_NURSING_FACILITY): Payer: Medicare Other | Admitting: Internal Medicine

## 2016-06-28 ENCOUNTER — Encounter: Payer: Self-pay | Admitting: Internal Medicine

## 2016-06-28 DIAGNOSIS — Z96649 Presence of unspecified artificial hip joint: Secondary | ICD-10-CM | POA: Diagnosis not present

## 2016-06-28 DIAGNOSIS — I10 Essential (primary) hypertension: Secondary | ICD-10-CM

## 2016-06-28 DIAGNOSIS — I480 Paroxysmal atrial fibrillation: Secondary | ICD-10-CM

## 2016-06-28 DIAGNOSIS — I495 Sick sinus syndrome: Secondary | ICD-10-CM

## 2016-06-28 DIAGNOSIS — G903 Multi-system degeneration of the autonomic nervous system: Secondary | ICD-10-CM | POA: Diagnosis not present

## 2016-06-28 DIAGNOSIS — E039 Hypothyroidism, unspecified: Secondary | ICD-10-CM | POA: Diagnosis not present

## 2016-06-28 DIAGNOSIS — G20 Parkinson's disease: Secondary | ICD-10-CM

## 2016-06-28 NOTE — Progress Notes (Signed)
Provider:  Veleta Miners Location:   [facility] Room Number: 125/P Place of Service:  SNF (31)  PCP: [name], MD Patient Care Team: [name], MD as PCP - General (Internal Medicine)  Extended Emergency Contact Information Primary Emergency Contact: Doe,Phillip Address: [address]          [place], [place] [address] Johnnette Litter of [place] Phone: [phone] Relation: Spouse Secondary Emergency Contact: Daniel,Donna Address: [address]          [place], [place] [address] Johnnette Litter of [place] Phone: [phone] Mobile Phone: [phone] Relation: Daughter  Code Status: Full Code Goals of Care: Advanced Directive information Advanced Directives 06/28/2016  Does Patient Have a Medical Advance Directive? Yes  Type of Advance Directive (No Data)  Does patient want to make changes to medical advance directive? No - Patient declined  Copy of [place] in Chart? -  Would patient like information on creating a medical advance directive? No - Patient declined  Pre-existing out of facility DNR order (yellow form or pink MOST form) -      Chief Complaint  Patient presents with  . New Admit To SNF    HPI: Patient is a 83 y.o. female seen today for admission to SNF for therapy.  Patient has h/o Atrial fibrillation, Tachy-brady syndrome S/P permanent Pacemaker, Parkinson disease, hypothyroidism, Hyperlipidemia.  She fell from her bed at and came with Left hip pain. In the hospital she was found to have closed Subcapital Fracture of Left Femur.  She underwent Left Hip Hemiarthroplasty on 06/24/16. Her Post op course was uneventful and she is discharged to SNF for therapy. She does have h/o Postural Hypotension and feeling Dizzy. She does not take Florinef anymore and plan is to not treat her BP aggressively.  She is doing well in facility. Her pain is controlled on tylenol. She Lives with her husband and usually  independent at home with her ADL.   Past Medical History:  Diagnosis Date  . Angina at rest, with the tachycardia 07/05/2011  . Aortic insufficiency 07/05/2011  . Atrial fibrillation ([place])   . Brady-tachy syndrome ([place])    Dual chamber Medtronic pacemaker Adapta  . Chronic back pain   . Constipation 12/27/2011  . Gait instability   . Hyperlipidemia   . Hypothyroidism   . Normal cardiac stress test 2013  . OA (osteoarthritis)   . Pacemaker   . Parkinson's disease ([place])   . Tremor    Past Surgical History:  Procedure Laterality Date  . ABDOMINAL HYSTERECTOMY    . CARDIAC CATHETERIZATION  05/26/1999   normal  . CARDIAC CATHETERIZATION  2001   normal coronary arteries  . COLONOSCOPY N/A 11/15/2012   Procedure: COLONOSCOPY;  Surgeon: [name], MD;  [facility];  Service: Endoscopy;  Laterality: N/A;  1030  . HIP ARTHROPLASTY Left 06/24/2016   Procedure: ARTHROPLASTY BIPOLAR HIP (HEMIARTHROPLASTY);  Surgeon: Altamese Pel[place], MD;  Location: [facility];  Service: Orthopedics;  Laterality: Left;  . INSERT / REPLACE / REMOVE PACEMAKER    . NM MYOVIEW LTD  07/19/2011   normal  . PERMANENT PACEMAKER INSERTION  07/05/2011   Medtronic Adapta dual chamber  . PERMANENT PACEMAKER INSERTION N/A 07/05/2011   Procedure: PERMANENT PACEMAKER INSERTION;  Surgeon: [name], MD;  Location: [facility]ATH [facility];  Service: Cardiovascular;  Laterality: N/A;    reports that she has never smoked. She has never used smokeless tobacco. She reports that she does  not drink alcohol or use drugs. Social History   Social History  . Marital status: Married    Spouse name: N/A  . Number of children: N/A  . Years of education: N/A   Occupational History  . Not on file.   Social History Main Topics  . Smoking status: Never Smoker  . Smokeless tobacco: Never Used  . Alcohol use No  . Drug use: No  . Sexual activity: Not on file   Other Topics Concern  . Not on file   Social History Narrative  .  No narrative on file    Functional Status Survey:    Family History  Problem Relation Age of Onset  . Colon cancer Mother 52  . Heart attack Mother   . Cancer Father     Health Maintenance  Topic Date Due  . TETANUS/TDAP  02/14/1952  . PNA vac Low Risk Adult (2 of 2 - PCV13) 02/07/2015  . INFLUENZA VACCINE  08/24/2016  . DEXA SCAN  Completed    No Known Allergies  Outpatient Encounter Prescriptions as of 06/28/2016  Medication Sig  . acetaminophen (TYLENOL) 325 MG tablet Take 2 tablets (650 mg total) by mouth every 6 (six) hours as needed for mild pain (or Fever >/= 101).  . carbidopa-levodopa (SINEMET IR) 25-100 MG per tablet Take 1 tablet by mouth 3 (three) times daily.   Marland Kitchen ELIQUIS 2.5 MG TABS tablet TAKE (1) TABLET BY MOUTH TWICE DAILY.  . hydroxypropyl methylcellulose / hypromellose (ISOPTO TEARS / GONIOVISC) 2.5 % ophthalmic solution Place 1 drop into both eyes as needed for dry eyes (uses ointment).  Marland Kitchen levothyroxine (SYNTHROID, LEVOTHROID) 50 MCG tablet Take 50 mcg by mouth every evening.   Marland Kitchen Lifitegrast (XIIDRA) 5 % SOLN Apply 1 drop to eye every evening. Both eyes  . rosuvastatin (CRESTOR) 10 MG tablet Take 10 mg by mouth at bedtime. Reported on 07/09/2015  . senna-docusate (SENOKOT-S) 8.6-50 MG tablet Take 1 tablet by mouth at bedtime as needed for mild constipation.  . [DISCONTINUED] diltiazem (CARDIZEM) 30 MG tablet Take 1 tablet (30 mg total) by mouth daily as needed (for rapid palpitations only).   No facility-administered encounter medications on file as of 06/28/2016.      Review of Systems  Review of Systems  Constitutional: Negative for activity change, , chills, diaphoresis, fatigue and fever.  HENT: Negative for mouth sores, postnasal drip, rhinorrhea, sinus pain and sore throat.   Respiratory: Negative for apnea, cough, chest tightness, shortness of breath and wheezing.   Cardiovascular: Negative for chest pain, palpitations and leg swelling.    Gastrointestinal: Negative for abdominal distention, abdominal pain, constipation, diarrhea, nausea and vomiting.  Genitourinary: Negative for dysuria and frequency.  Musculoskeletal: Negative for arthralgias, joint swelling and myalgias.  Skin: Negative for rash.  Neurological: Negative for , syncope, weakness, light-headedness and numbness.  Psychiatric/Behavioral: Negative for behavioral problems, confusion and sleep disturbance.     Vitals:   06/28/16 1205  BP: (!) 146/63  Pulse: 75  Resp: 20  Temp: 97.6 F (36.4 C)   There is no height or weight on file to calculate BMI. Physical Exam  Constitutional: She is oriented to person, place, and time. She appears well-developed and well-nourished.  HENT:  Head: Normocephalic.  Mouth/Throat: Oropharynx is clear and moist.  Eyes: Pupils are equal, round, and reactive to light.  Neck: Neck supple.  Cardiovascular: Normal rate.  An irregularly irregular rhythm present.  No murmur heard. Pulmonary/Chest: Effort normal and breath sounds  normal. No respiratory distress. She has no wheezes. She has no rales.  Abdominal: Soft. Bowel sounds are normal. She exhibits no distension. There is no tenderness. There is no rebound and no guarding.  Musculoskeletal: She exhibits no edema.  Neurological: She is alert and oriented to person, place, and time.  Has 4/5 strength in all extremities. No Focal deficit.  Skin: Skin is warm and dry. No rash noted. No erythema.  Psychiatric: She has a normal mood and affect. Her behavior is normal. Judgment and thought content normal.    Labs reviewed: Basic Metabolic Panel:  Recent Labs  03/03/16 1152  06/25/16 0624 06/26/16 0542 06/27/16 0501  NA  --   < > 133* 139 139  K  --   < > 3.6 3.0* 3.9  CL  --   < > 99* 102 106  CO2  --   < > 24 27 27   GLUCOSE  --   < > 121* 127* 101*  BUN  --   < > 7 5* 6  CREATININE  --   < > 0.67 0.55 0.53  CALCIUM  --   < > 8.3* 8.3* 8.4*  MG 1.8  --   --   --    --   < > = values in this interval not displayed. Liver Function Tests:  Recent Labs  06/23/16 1144  AST 18  ALT 10*  ALKPHOS 58  BILITOT 0.8  PROT 5.5*  ALBUMIN 3.6   No results for input(s): LIPASE, AMYLASE in the last 8760 hours. No results for input(s): AMMONIA in the last 8760 hours. CBC:  Recent Labs  06/23/16 1144  06/25/16 0624 06/26/16 0542 06/27/16 0501  WBC 12.1*  < > 8.1 9.3 8.0  NEUTROABS 10.2*  --   --   --   --   HGB 13.8  < > 11.7* 12.0 11.7*  HCT 41.7  < > 36.1 35.7* 35.2*  MCV 86.0  < > 86.4 84.4 85.0  PLT 171  < > 143* 164 173  < > = values in this interval not displayed. Cardiac Enzymes: No results for input(s): CKTOTAL, CKMB, CKMBINDEX, TROPONINI in the last 8760 hours. BNP: Invalid input(s): POCBNP No results found for: HGBA1C Lab Results  Component Value Date   TSH 3.215 01/06/2015   No results found for: VITAMINB12 No results found for: FOLATE No results found for: IRON, TIBC, FERRITIN  Imaging and Procedures obtained prior to SNF admission: No results found.  Assessment/Plan S/P hip hemiarthroplasty Patient doing well. Continue Therapy and supportive care.  Paroxysmal atrial fibrillation  On Eliquis. Takes Cardizem as needed for palpitations.  Orthostatic hypotension due to Parkinson's disease  Florinef Dc'ed as she does not take it.  Essential hypertension BP mildly high but no Antihypertensive due to Postural  Hypotension.  Hypothyroidism On synthroid Will recheck TSH Parkinson disease  Continue Sinemet Follows with Neurology     Family/ staff Communication:   Labs/tests ordered: CBC, BMP, TSH in 1 week. Follow up with Ortho Disposition home.

## 2016-07-05 ENCOUNTER — Non-Acute Institutional Stay (SKILLED_NURSING_FACILITY): Payer: Medicare Other | Admitting: Internal Medicine

## 2016-07-05 ENCOUNTER — Encounter: Payer: Self-pay | Admitting: Internal Medicine

## 2016-07-05 ENCOUNTER — Encounter (HOSPITAL_COMMUNITY)
Admission: RE | Admit: 2016-07-05 | Discharge: 2016-07-05 | Disposition: A | Discharge status: Home / Self Care | Payer: Medicare Other | Source: Skilled Nursing Facility | Attending: Internal Medicine | Admitting: Internal Medicine

## 2016-07-05 DIAGNOSIS — I480 Paroxysmal atrial fibrillation: Secondary | ICD-10-CM | POA: Insufficient documentation

## 2016-07-05 DIAGNOSIS — D72829 Elevated white blood cell count, unspecified: Secondary | ICD-10-CM | POA: Diagnosis not present

## 2016-07-05 DIAGNOSIS — G20 Parkinson's disease: Secondary | ICD-10-CM | POA: Insufficient documentation

## 2016-07-05 DIAGNOSIS — E876 Hypokalemia: Secondary | ICD-10-CM | POA: Insufficient documentation

## 2016-07-05 DIAGNOSIS — E785 Hyperlipidemia, unspecified: Secondary | ICD-10-CM | POA: Insufficient documentation

## 2016-07-05 DIAGNOSIS — E039 Hypothyroidism, unspecified: Secondary | ICD-10-CM | POA: Diagnosis not present

## 2016-07-05 DIAGNOSIS — I351 Nonrheumatic aortic (valve) insufficiency: Secondary | ICD-10-CM | POA: Insufficient documentation

## 2016-07-05 DIAGNOSIS — D473 Essential (hemorrhagic) thrombocythemia: Secondary | ICD-10-CM

## 2016-07-05 DIAGNOSIS — D75839 Thrombocytosis, unspecified: Secondary | ICD-10-CM

## 2016-07-05 DIAGNOSIS — I10 Essential (primary) hypertension: Secondary | ICD-10-CM | POA: Insufficient documentation

## 2016-07-05 LAB — BASIC METABOLIC PANEL
ANION GAP: 11 (ref 5–15)
BUN: 15 mg/dL (ref 6–20)
CHLORIDE: 103 mmol/L (ref 101–111)
CO2: 27 mmol/L (ref 22–32)
Calcium: 8.9 mg/dL (ref 8.9–10.3)
Creatinine, Ser: 0.66 mg/dL (ref 0.44–1.00)
GFR calc Af Amer: >60 mL/min (ref >60)
GLUCOSE: 91 mg/dL (ref 65–99)
Potassium: 4 mmol/L (ref 3.5–5.1)
Sodium: 141 mmol/L (ref 135–145)

## 2016-07-05 LAB — CBC
HEMATOCRIT: 39.4 % (ref 36.0–46.0)
HEMOGLOBIN: 12.8 g/dL (ref 12.0–15.0)
MCH: 28.6 pg (ref 26.0–34.0)
MCHC: 32.5 g/dL (ref 30.0–36.0)
MCV: 87.9 fL (ref 78.0–100.0)
Platelets: 429 10*3/uL — ABNORMAL HIGH (ref 150–400)
RBC: 4.48 MIL/uL (ref 3.87–5.11)
RDW: 13.3 % (ref 11.5–15.5)
WBC: 11.2 10*3/uL — ABNORMAL HIGH (ref 4.0–10.5)

## 2016-07-05 LAB — TSH: TSH: 6.827 u[IU]/mL — ABNORMAL HIGH (ref 0.350–4.500)

## 2016-07-05 NOTE — Progress Notes (Signed)
Location:   [facility] Room Number: 125/P Place of Service:  SNF (31) Provider:  Charleston Poot, MD  Patient Care Team: [name], MD as PCP - General (Internal Medicine)  Extended Emergency Contact Information Primary Emergency Contact: Jaso,Phillip Address: [address]          [place], [place] [address] Johnnette Litter of [place] Phone: [phone] Relation: Spouse Secondary Emergency Contact: Daniel,Donna Address: [address]          [place], [place] [address] Johnnette Litter of [place] Phone: [phone] Mobile Phone: [phone] Relation: Daughter  Code Status:  Full Code Goals of care: Advanced Directive information Advanced Directives 07/05/2016  Does Patient Have a Medical Advance Directive? Yes  Type of Advance Directive (No Data)  Does patient want to make changes to medical advance directive? No - Patient declined  Copy of [place] in Chart? -  Would patient like information on creating a medical advance directive? No - Patient declined  Pre-existing out of facility DNR order (yellow form or pink MOST form) -    Acute visit secondary to elevated TSH-thrombocytosis? Minimal leukocytosis   HPI:  Pt is a 83 y.o. female seen today for an acute visit for follow-up of lab which shows her TSH is somewhat elevated at 6.827-also her white count is 11.2 which is mildly elevated there is no differential.  Also she has mild thrombocytosis with platelets of 429 thousand  She is here for rehabilitation after falling out of bed and sustained a left hip fracture which was surgically repaired on 06/24/2016.  She also has a history of atrial fibrillation tachybradycardia syndrome with a pacemaker Parkinson's disease hypothyroidism hyperlipidemia.  In regards hypothyroidism again TSH is elevated as noted above she is on Synthroid 50 g a day.  She is not complaining of any fever chills cough or shortness of  breath or chest congestion and eyes any diarrhea or dysuria.  Her main concern is she wants to know which go home.  Family also is concerned about her sutures wondering when they can come out and we will have nursing staff contact orthopedics about their recommendations.  The site itself appears to be unremarkable I do not see any sign of infection erythema drainage from the surgical site.    Past Medical History:  Diagnosis Date  . Angina at rest, with the tachycardia 07/05/2011  . Aortic insufficiency 07/05/2011  . Atrial fibrillation ([place])   . Brady-tachy syndrome ([place])    Dual chamber Medtronic pacemaker Adapta  . Chronic back pain   . Constipation 12/27/2011  . Gait instability   . Hyperlipidemia   . Hypothyroidism   . Normal cardiac stress test 2013  . OA (osteoarthritis)   . Pacemaker   . Parkinson's disease ([place])   . Tremor    Past Surgical History:  Procedure Laterality Date  . ABDOMINAL HYSTERECTOMY    . CARDIAC CATHETERIZATION  05/26/1999   normal  . CARDIAC CATHETERIZATION  2001   normal coronary arteries  . COLONOSCOPY N/A 11/15/2012   Procedure: COLONOSCOPY;  Surgeon: [name], MD;  [facility];  Service: Endoscopy;  Laterality: N/A;  1030  . HIP ARTHROPLASTY Left 06/24/2016   Procedure: ARTHROPLASTY BIPOLAR HIP (HEMIARTHROPLASTY);  Surgeon: Altamese Whitney, MD;  Location: [facility];  Service: Orthopedics;  Laterality: Left;  . INSERT / REPLACE / REMOVE PACEMAKER    . NM MYOVIEW LTD  07/19/2011   normal  .  PERMANENT PACEMAKER INSERTION  07/05/2011   Medtronic Adapta dual chamber  . PERMANENT PACEMAKER INSERTION N/A 07/05/2011   Procedure: PERMANENT PACEMAKER INSERTION;  Surgeon: [name], MD;  Location: [facility];  Service: Cardiovascular;  Laterality: N/A;    No Known Allergies  Outpatient Encounter Prescriptions as of 07/05/2016  Medication Sig  . acetaminophen (TYLENOL) 325 MG tablet Take 2 tablets (650 mg total) by mouth every 6 (six)  hours as needed for mild pain (or Fever >/= 101).  . bisacodyl (DULCOLAX) 10 MG suppository Place 10 mg rectally as needed for moderate constipation.  . carbidopa-levodopa (SINEMET IR) 25-100 MG per tablet Take 1 tablet by mouth 3 (three) times daily.   . cycloSPORINE (RESTASIS) 0.05 % ophthalmic emulsion 1 drop at bedtime.  Marland Kitchen ELIQUIS 2.5 MG TABS tablet TAKE (1) TABLET BY MOUTH TWICE DAILY.  . hydroxypropyl methylcellulose / hypromellose (ISOPTO TEARS / GONIOVISC) 2.5 % ophthalmic solution Place 1 drop into both eyes as needed for dry eyes (uses ointment).  Marland Kitchen levothyroxine (SYNTHROID, LEVOTHROID) 50 MCG tablet Take 50 mcg by mouth every evening.   . rosuvastatin (CRESTOR) 10 MG tablet Take 10 mg by mouth at bedtime. Reported on 07/09/2015  . senna-docusate (SENOKOT-S) 8.6-50 MG tablet Take 1 tablet by mouth at bedtime as needed for mild constipation.  . [DISCONTINUED] Lifitegrast (XIIDRA) 5 % SOLN Apply 1 drop to eye every evening. Both eyes   No facility-administered encounter medications on file as of 07/05/2016.     Review of Systems   In general she is not complaining of any fever or chills.  Skin does not complain of any diaphoresis itching or rashes.  Head ears eyes nose mouth and throat does not complaining of any sore throat or visual changes.  Respiratory denies cough or shortness of breath or chest congestion.  Cardiac denies chest pain does not appear to have any significant edema.  GI is not complaining of any abdominal discomfort nausea vomiting or diarrhea says at times she will have some constipation.  GU denies dysuria or burning with urination.  Muscle skeletal does not really complain of acute joint pain or hip pain at this time.  Neurologic is not complaining of dizziness headache or numbness.  Psych continues to be in good spirits does not complain of overt anxiety or depression.    Immunization History  Administered Date(s) Administered  .  Influenza-Unspecified 09/24/2012  . Pneumococcal-Unspecified 02/06/2014   Pertinent  Health Maintenance Due  Topic Date Due  . PNA vac Low Risk Adult (2 of 2 - PCV13) 02/07/2015  . INFLUENZA VACCINE  08/24/2016  . DEXA SCAN  Completed   No flowsheet data found. Functional Status Survey:    Vitals:   07/05/16 1427  BP: (!) 124/59  Pulse: 80  Resp: 18  Temp: 98.2 F (36.8 C)  TempSrc: Oral    Physical Exam   In general this is a very pleasant elderly female in no distress sitting currently in a wheelchair.  Her skin is warm and dry-surgical site left hip sutures are in place I do not see any drainage bleeding or surrounding erythema or sign of infection.  Eyes pupils appear reactive light sclera and instruct clear visual acuity appears grossly intact she has prescription lenses.  Oropharynx is clear mucous membranes moist.  Chest is clear to auscultation there is no labored breathing.  Heart is irregular irregular rate and rhythm without murmur gallop or rub she does not have significant lower extremity edema.  Her  abdomen is soft nontender with positive bowel sounds.  GU could not really appreciate any suprapubic tenderness  Musculoskeletal moves all extremities 4 is able to stand without assistance-she does not have any edema.  Neurologic is grossly intact her speech is clear no lateralizing findings  Psych she is alert and oriented pleasant and appropriate.    Labs reviewed:  Recent Labs  03/03/16 1152  06/26/16 0542 06/27/16 0501 07/05/16 0753  NA  --   < > 139 139 141  K  --   < > 3.0* 3.9 4.0  CL  --   < > 102 106 103  CO2  --   < > 27 27 27   GLUCOSE  --   < > 127* 101* 91  BUN  --   < > 5* 6 15  CREATININE  --   < > 0.55 0.53 0.66  CALCIUM  --   < > 8.3* 8.4* 8.9  MG 1.8  --   --   --   --   < > = values in this interval not displayed.  Recent Labs  06/23/16 1144  AST 18  ALT 10*  ALKPHOS 58  BILITOT 0.8  PROT 5.5*  ALBUMIN 3.6     Recent Labs  06/23/16 1144  06/26/16 0542 06/27/16 0501 07/05/16 0753  WBC 12.1*  < > 9.3 8.0 11.2*  NEUTROABS 10.2*  --   --   --   --   HGB 13.8  < > 12.0 11.7* 12.8  HCT 41.7  < > 35.7* 35.2* 39.4  MCV 86.0  < > 84.4 85.0 87.9  PLT 171  < > 164 173 429*  < > = values in this interval not displayed. Lab Results  Component Value Date   TSH 6.827 (H) 07/05/2016   No results found for: HGBA1C Lab Results  Component Value Date   CHOL 133 04/30/2015   HDL 54 04/30/2015   LDLCALC 62 04/30/2015   TRIG 84 04/30/2015   CHOLHDL 2.5 04/30/2015    Significant Diagnostic Results in last 30 days:  Dg Knee Left Port  Result Date: 06/23/2016 CLINICAL DATA:  Acute left hip fracture. EXAM: PORTABLE LEFT KNEE - 1-2 VIEW COMPARISON:  None in PACs FINDINGS: The bones of the knee are subjectively adequately mineralized. There is no acute fracture or dislocation. There is mild degenerative joint space loss of the medial compartment. A marginal osteophyte arises from the periphery of the medial tibial plateau. Small spurs arise from the articular margins of the patella. There is no joint effusion. IMPRESSION: There is no acute bony abnormality of the left knee. Electronically Signed   By: David  Martinique M.D.   On: 06/23/2016 14:01   Dg Hip Port Unilat With Pelvis 1v Left  Result Date: 06/24/2016 CLINICAL DATA:  Post hip arthroplasty EXAM: DG HIP (WITH OR WITHOUT PELVIS) 1V PORT LEFT COMPARISON:  06/23/2016 FINDINGS: Interval resection of the LEFT femoral head and neck with placement of a LEFT hip prosthesis. Diffuse osseous demineralization. SI joints symmetric with preservation of RIGHT hip joint space. No acute fracture, dislocation or bone destruction. Degenerative disc disease changes with levoconvex scoliosis at visualized lower lumbar spine. IMPRESSION: LEFT hip replacement and osseous demineralization without acute bony abnormalities. Electronically Signed   By: Lavonia Dana M.D.   On:  06/24/2016 16:50   Dg Hip Unilat With Pelvis 2-3 Views Left  Result Date: 06/23/2016 CLINICAL DATA:  Patient fell off bed.  LEFT hip pain. EXAM: DG  HIP (WITH OR WITHOUT PELVIS) 2-3V LEFT COMPARISON:  None. FINDINGS: There is an acute subcapital LEFT hip fracture with angulation. Upward displacement of the femoral shaft on the femoral neck, with rotation. Osteopenia. Lumbar spondylosis. IMPRESSION: Acute Subcapital LEFT hip fracture with angulation and foreshortening of the leg. Electronically Signed   By: Staci Righter M.D.   On: 06/23/2016 10:41    Assessment/Plan  1 history of elevated TSH-will increase her Synthroid at 75 g a day and recheck a TSH in 4 weeks.  #2 history of minimal leukocytosis-we do not really have a differential for further information here but she is really denying any sign of infection at this time no dysuria fever or chills no chest congestion cough surgical site looks fairly benign-at this point will reorder a CBC with differential tomorrow and monitor.  #3 thrombocytosis this appears fairly minimal as well but platelets just over 400,000 we will get update a CBC tomorrow possibly there may be a reactive component to this with her recent surgery. Previous platelet counts have been more in the mid 100s    #4 history of status post chemotherapy arthroplasty on the left-she is doing well with supportive care-will have nursing staff contact orthopedics about suture removal recommendations.  #5 history of atrial fibrillation she continues on anticoagulation with Eliquis apparently he has had a Cardizem order as needed for palpitations but this has been stable  [id number]

## 2016-07-06 ENCOUNTER — Encounter (HOSPITAL_COMMUNITY)
Admission: RE | Admit: 2016-07-06 | Discharge: 2016-07-06 | Disposition: A | Discharge status: Home / Self Care | Payer: Medicare Other | Source: Skilled Nursing Facility | Attending: Internal Medicine | Admitting: Internal Medicine

## 2016-07-06 DIAGNOSIS — E876 Hypokalemia: Secondary | ICD-10-CM | POA: Insufficient documentation

## 2016-07-06 DIAGNOSIS — E039 Hypothyroidism, unspecified: Secondary | ICD-10-CM | POA: Insufficient documentation

## 2016-07-06 DIAGNOSIS — I480 Paroxysmal atrial fibrillation: Secondary | ICD-10-CM | POA: Insufficient documentation

## 2016-07-06 DIAGNOSIS — E785 Hyperlipidemia, unspecified: Secondary | ICD-10-CM | POA: Insufficient documentation

## 2016-07-06 DIAGNOSIS — I10 Essential (primary) hypertension: Secondary | ICD-10-CM | POA: Insufficient documentation

## 2016-07-06 DIAGNOSIS — I351 Nonrheumatic aortic (valve) insufficiency: Secondary | ICD-10-CM | POA: Insufficient documentation

## 2016-07-06 LAB — CBC WITH DIFFERENTIAL/PLATELET
BASOS ABS: 0.1 10*3/uL (ref 0.0–0.1)
BASOS PCT: 0 %
EOS ABS: 0.5 10*3/uL (ref 0.0–0.7)
EOS PCT: 4 %
HCT: 38 % (ref 36.0–46.0)
Hemoglobin: 12.4 g/dL (ref 12.0–15.0)
LYMPHS PCT: 18 %
Lymphs Abs: 2 10*3/uL (ref 0.7–4.0)
MCH: 28.5 pg (ref 26.0–34.0)
MCHC: 32.6 g/dL (ref 30.0–36.0)
MCV: 87.4 fL (ref 78.0–100.0)
Monocytes Absolute: 0.8 10*3/uL (ref 0.1–1.0)
Monocytes Relative: 8 %
Neutro Abs: 7.8 10*3/uL — ABNORMAL HIGH (ref 1.7–7.7)
Neutrophils Relative %: 70 %
PLATELETS: 459 10*3/uL — AB (ref 150–400)
RBC: 4.35 MIL/uL (ref 3.87–5.11)
RDW: 13.3 % (ref 11.5–15.5)
WBC: 11.2 10*3/uL — AB (ref 4.0–10.5)

## 2016-07-13 DIAGNOSIS — S72012D Unspecified intracapsular fracture of left femur, subsequent encounter for closed fracture with routine healing: Secondary | ICD-10-CM | POA: Diagnosis not present

## 2016-07-14 ENCOUNTER — Encounter: Payer: Self-pay | Admitting: Internal Medicine

## 2016-07-14 ENCOUNTER — Non-Acute Institutional Stay (SKILLED_NURSING_FACILITY): Payer: Medicare Other | Admitting: Internal Medicine

## 2016-07-14 DIAGNOSIS — I10 Essential (primary) hypertension: Secondary | ICD-10-CM

## 2016-07-14 DIAGNOSIS — G20 Parkinson's disease: Secondary | ICD-10-CM | POA: Diagnosis not present

## 2016-07-14 DIAGNOSIS — I480 Paroxysmal atrial fibrillation: Secondary | ICD-10-CM

## 2016-07-14 DIAGNOSIS — E039 Hypothyroidism, unspecified: Secondary | ICD-10-CM | POA: Diagnosis not present

## 2016-07-14 DIAGNOSIS — S72012D Unspecified intracapsular fracture of left femur, subsequent encounter for closed fracture with routine healing: Secondary | ICD-10-CM

## 2016-07-14 NOTE — Progress Notes (Signed)
Location:   [facility] Room Number: 125/P Place of Service:  SNF (31)  Provider: Granville Lewis  PCP: [name], MD Patient Care Team: [name], MD as PCP - General (Internal Medicine)  Extended Emergency Contact Information Primary Emergency Contact: Babler,Phillip Address: [address]          [place], [place] [address] Johnnette Litter of [place] Phone: [phone] Relation: Spouse Secondary Emergency Contact: Daniel,Donna Address: [address]          [place], [place] [address] Johnnette Litter of [place] Phone: [phone] Mobile Phone: [phone] Relation: Daughter  Code Status: Full Code Goals of care:  Advanced Directive information Advanced Directives 07/14/2016  Does Patient Have a Medical Advance Directive? Yes  Type of Advance Directive (No Data)  Does patient want to make changes to medical advance directive? No - Patient declined  Copy of [place] in Chart? -  Would patient like information on creating a medical advance directive? No - Patient declined  Pre-existing out of facility DNR order (yellow form or pink MOST form) -     No Known Allergies  Chief Complaint  Patient presents with  . Discharge Note    HPI:  83 y.o. female seen today for discharge from facility early next week.  She is here for rehabilitation after falling from her bed and sustaining a left hip fracture that was surgically repaired.  Postop course has been quite uneventful she has done well with therapy and has followed up with orthopedics.  She will be a going home with her husband who is very supportive and usually at her bedside.  Opacities had some history of orthostatic hypotension or feeling dizzy that has been stable here she does not take Florinef anymore and the plan was not to treat this blood pressure aggressively since she appears to be stable  She does have a history of Parkinson's disease she is on Sinemet this  appears to be stable she is followed by neurology.  Also has a history hypothyroidism TSH was mildly elevated on update lab here and Synthroid dose has been adjusted accordingly she will need this updated next month I suspect by her primary care provider.  She also has a history of atrial fibrillation which is rate controlled she is not really on any routine rate limiting agent but does have an order for when necessary Cardizem and apparently she has not needed it here but does occasionally take at home usse  this when necessary.   is on Eliquis for anticoagulation.  Currently she has no acute complaints vital signs are stable continues to be pleasant alert and engaging         Past Medical History:  Diagnosis Date  . Angina at rest, with the tachycardia 07/05/2011  . Aortic insufficiency 07/05/2011  . Atrial fibrillation ([place])   . Brady-tachy syndrome ([place])    Dual chamber Medtronic pacemaker Adapta  . Chronic back pain   . Constipation 12/27/2011  . Gait instability   . Hyperlipidemia   . Hypothyroidism   . Normal cardiac stress test 2013  . OA (osteoarthritis)   . Pacemaker   . Parkinson's disease ([place])   . Tremor     Past Surgical History:  Procedure Laterality Date  . ABDOMINAL HYSTERECTOMY    . CARDIAC CATHETERIZATION  05/26/1999   normal  . CARDIAC CATHETERIZATION  2001   normal coronary arteries  . COLONOSCOPY N/A 11/15/2012   Procedure: COLONOSCOPY;  Surgeon: Bernadene Person  Gloriann Loan, MD;  [facility];  Service: Endoscopy;  Laterality: N/A;  1030  . HIP ARTHROPLASTY Left 06/24/2016   Procedure: ARTHROPLASTY BIPOLAR HIP (HEMIARTHROPLASTY);  Surgeon: Altamese Grayson, MD;  Location: [facility];  Service: Orthopedics;  Laterality: Left;  . INSERT / REPLACE / REMOVE PACEMAKER    . NM MYOVIEW LTD  07/19/2011   normal  . PERMANENT PACEMAKER INSERTION  07/05/2011   Medtronic Adapta dual chamber  . PERMANENT PACEMAKER INSERTION N/A 07/05/2011   Procedure: PERMANENT PACEMAKER  INSERTION;  Surgeon: [name], MD;  Location: [facility] CATH [facility];  Service: Cardiovascular;  Laterality: N/A;      reports that she has never smoked. She has never used smokeless tobacco. She reports that she does not drink alcohol or use drugs. Social History   Social History  . Marital status: Married    Spouse name: N/A  . Number of children: N/A  . Years of education: N/A   Occupational History  . Not on file.   Social History Main Topics  . Smoking status: Never Smoker  . Smokeless tobacco: Never Used  . Alcohol use No  . Drug use: No  . Sexual activity: Not on file   Other Topics Concern  . Not on file   Social History Narrative  . No narrative on file   Functional Status Survey:    No Known Allergies  Pertinent  Health Maintenance Due  Topic Date Due  . PNA vac Low Risk Adult (2 of 2 - PCV13) 02/07/2015  . INFLUENZA VACCINE  08/24/2016  . DEXA SCAN  Completed    Medications: Outpatient Encounter Prescriptions as of 07/14/2016  Medication Sig  . acetaminophen (TYLENOL) 325 MG tablet Take 2 tablets (650 mg total) by mouth every 6 (six) hours as needed for mild pain (or Fever >/= 101).  . bisacodyl (DULCOLAX) 10 MG suppository Place 10 mg rectally as needed for moderate constipation.  . carbidopa-levodopa (SINEMET IR) 25-100 MG per tablet Take 1 tablet by mouth 3 (three) times daily.   . cycloSPORINE (RESTASIS) 0.05 % ophthalmic emulsion 1 drop at bedtime.  Marland Kitchen ELIQUIS 2.5 MG TABS tablet TAKE (1) TABLET BY MOUTH TWICE DAILY.  . hydroxypropyl methylcellulose / hypromellose (ISOPTO TEARS / GONIOVISC) 2.5 % ophthalmic solution Place 1 drop into both eyes as needed for dry eyes (uses ointment).  Marland Kitchen levothyroxine (SYNTHROID, LEVOTHROID) 75 MCG tablet Take 75 mcg by mouth daily before breakfast.  . rosuvastatin (CRESTOR) 10 MG tablet Take 10 mg by mouth at bedtime. Reported on 07/09/2015  . senna-docusate (SENOKOT-S) 8.6-50 MG tablet Take 1 tablet by mouth at bedtime as  needed for mild constipation.  . [DISCONTINUED] levothyroxine (SYNTHROID, LEVOTHROID) 50 MCG tablet Take 50 mcg by mouth every evening.    No facility-administered encounter medications on file as of 07/14/2016.      Review of Systems   In general she is not complaining of any fever or chills.  Skin does not complain of any diaphoresis itching or rashes. Surgical site sutures have been removed  Head ears eyes nose mouth and throat does not complaining of any sore throat or visual changes.  Respiratory denies cough or shortness of breath or chest congestion.  Cardiac denies chest pain does not appear to have any significant edema.  GI is not complaining of any abdominal discomfort nausea vomiting or diarrhea says at times she will have some constipation. Did have a good bowel movement yesterday GU denies dysuria or burning with urination.  Muscle skeletal does not really complain of acute joint pain or hip pain at this time.  Neurologic is not complaining of dizziness headache or numbness.  Psych continues to be in good spirits does not complain of overt anxiety or depression.    Vitals:   07/14/16 1628  BP: (!) 106/53  Pulse: 76  Resp: 16  Temp: (!) 95.5 F (35.3 C)  TempSrc: Oral  SpO2: 100%   Weight appears to be stable 125 pounds Physical Exam In general this is a very pleasant elderly female in no distress currently sitting on the side of her bed  Her skin is warm and dry-surgical site  Has a well-healed scar no sign of infection noted--no drainage bleeding or surrounding erythema  Eyes pupils appear reactive light sclera and instruct clear visual acuity appears grossly intact she has prescription lenses.  Oropharynx is clear mucous membranes moist.  Chest is clear to auscultation there is no labored breathing.  Heart is irregular irregular rate and rhythm without murmur gallop or rub she does not have significant lower extremity edema-says  venous stasis changes with minimal pedal edema.  Her abdomen is soft nontender with positive bowel sounds.  GU could not really appreciate any suprapubic tenderness  Musculoskeletal moves all extremities 4 is able to stand without assistance-strength appears to be appropriate and intact all 4 extremities.  Neurologic is grossly intact her speech is clear no lateralizing findings  Psych she is alert and oriented pleasant and appropriate.   Labs reviewed: Basic Metabolic Panel:  Recent Labs  03/03/16 1152  06/26/16 0542 06/27/16 0501 07/05/16 0753  NA  --   < > 139 139 141  K  --   < > 3.0* 3.9 4.0  CL  --   < > 102 106 103  CO2  --   < > 27 27 27   GLUCOSE  --   < > 127* 101* 91  BUN  --   < > 5* 6 15  CREATININE  --   < > 0.55 0.53 0.66  CALCIUM  --   < > 8.3* 8.4* 8.9  MG 1.8  --   --   --   --   < > = values in this interval not displayed. Liver Function Tests:  Recent Labs  06/23/16 1144  AST 18  ALT 10*  ALKPHOS 58  BILITOT 0.8  PROT 5.5*  ALBUMIN 3.6   No results for input(s): LIPASE, AMYLASE in the last 8760 hours. No results for input(s): AMMONIA in the last 8760 hours. CBC:  Recent Labs  06/23/16 1144  06/27/16 0501 07/05/16 0753 07/06/16 0722  WBC 12.1*  < > 8.0 11.2* 11.2*  NEUTROABS 10.2*  --   --   --  7.8*  HGB 13.8  < > 11.7* 12.8 12.4  HCT 41.7  < > 35.2* 39.4 38.0  MCV 86.0  < > 85.0 87.9 87.4  PLT 171  < > 173 429* 459*  < > = values in this interval not displayed. Cardiac Enzymes: No results for input(s): CKTOTAL, CKMB, CKMBINDEX, TROPONINI in the last 8760 hours. BNP: Invalid input(s): POCBNP CBG: No results for input(s): GLUCAP in the last 8760 hours.  Procedures and Imaging Studies During Stay: Dg Knee Left Port  Result Date: 06/23/2016 CLINICAL DATA:  Acute left hip fracture. EXAM: PORTABLE LEFT KNEE - 1-2 VIEW COMPARISON:  None in PACs FINDINGS: The bones of the knee are subjectively adequately mineralized. There is  no acute fracture  or dislocation. There is mild degenerative joint space loss of the medial compartment. A marginal osteophyte arises from the periphery of the medial tibial plateau. Small spurs arise from the articular margins of the patella. There is no joint effusion. IMPRESSION: There is no acute bony abnormality of the left knee. Electronically Signed   By: David  Martinique M.D.   On: 06/23/2016 14:01   Dg Hip Port Unilat With Pelvis 1v Left  Result Date: 06/24/2016 CLINICAL DATA:  Post hip arthroplasty EXAM: DG HIP (WITH OR WITHOUT PELVIS) 1V PORT LEFT COMPARISON:  06/23/2016 FINDINGS: Interval resection of the LEFT femoral head and neck with placement of a LEFT hip prosthesis. Diffuse osseous demineralization. SI joints symmetric with preservation of RIGHT hip joint space. No acute fracture, dislocation or bone destruction. Degenerative disc disease changes with levoconvex scoliosis at visualized lower lumbar spine. IMPRESSION: LEFT hip replacement and osseous demineralization without acute bony abnormalities. Electronically Signed   By: Lavonia Dana M.D.   On: 06/24/2016 16:50   Dg Hip Unilat With Pelvis 2-3 Views Left  Result Date: 06/23/2016 CLINICAL DATA:  Patient fell off bed.  LEFT hip pain. EXAM: DG HIP (WITH OR WITHOUT PELVIS) 2-3V LEFT COMPARISON:  None. FINDINGS: There is an acute subcapital LEFT hip fracture with angulation. Upward displacement of the femoral shaft on the femoral neck, with rotation. Osteopenia. Lumbar spondylosis. IMPRESSION: Acute Subcapital LEFT hip fracture with angulation and foreshortening of the leg. Electronically Signed   By: Staci Righter M.D.   On: 06/23/2016 10:41    Assessment/Plan:    #1 history of left hip fracture status post arthroplasty-she is done quite well with this will need continued PT and OT at home as well as follow up with orthopedics also will need a bedside commode and rolling walker secondary to her fall risk-still has some weakness when  ambulating-she has a very supportive spouse.  #2-history of atrial fibrillation this appears rate controlled she does have when necessary Cardizem has not really needed and here the facility-but apparently has used at home with effectiveness.  #3 history of elevated TSH-her Synthroid has been increased will need updated TSH in approximately 4 weeks will defer to primary care provider.  #4 history of Parkinson's-this appears stable she is followed by neurology she continues on Sinemet.  #5 constipation she does have a history for or a as needed-as noted occasionally this is needed she is also on Senokot daily at bedtime when necessary-suspect she would benefit from this at home as well.  #6 history of minimal leukocytosis recent labs showed a white count of 11.2 very minimal elevation of refills-does not show sign of infection orl dysuria diarrhea chest congestion-but we will update this before discharge.-.  She also has minimal thrombocytosis suspect this may be reactive from surgery again will update this  #7 history of hypertension-please see discussion above-have some history of hypotension which has been stable asymptomatic no longer on Florinef recent blood pressures 106/53--115/52-133/72-.  Again she will be going home with her husband is very supportive will need a bedside commode and rolling walker still some continued weakness -would benefit from continued PT and OT  Will update a CBC with differential and metabolic panel before discharge.  [id number] note greater than 30 minutes spent on this discharge summary-greater than 50% of time spent coordinating plan of care for numerous diagnoses

## 2016-07-15 ENCOUNTER — Encounter (HOSPITAL_COMMUNITY)
Admission: RE | Admit: 2016-07-15 | Discharge: 2016-07-15 | Disposition: A | Discharge status: Home / Self Care | Payer: Medicare Other | Source: Skilled Nursing Facility | Attending: Internal Medicine | Admitting: Internal Medicine

## 2016-07-15 LAB — BASIC METABOLIC PANEL
Anion gap: 9 (ref 5–15)
BUN: 21 mg/dL — AB (ref 6–20)
CALCIUM: 9 mg/dL (ref 8.9–10.3)
CO2: 28 mmol/L (ref 22–32)
CREATININE: 0.66 mg/dL (ref 0.44–1.00)
Chloride: 103 mmol/L (ref 101–111)
GFR calc Af Amer: >60 mL/min (ref >60)
Glucose, Bld: 93 mg/dL (ref 65–99)
Potassium: 3.9 mmol/L (ref 3.5–5.1)
SODIUM: 140 mmol/L (ref 135–145)

## 2016-07-15 LAB — CBC WITH DIFFERENTIAL/PLATELET
Basophils Absolute: 0 10*3/uL (ref 0.0–0.1)
Basophils Relative: 0 %
EOS ABS: 0.4 10*3/uL (ref 0.0–0.7)
EOS PCT: 4 %
HCT: 40.1 % (ref 36.0–46.0)
Hemoglobin: 12.8 g/dL (ref 12.0–15.0)
LYMPHS ABS: 2 10*3/uL (ref 0.7–4.0)
Lymphocytes Relative: 20 %
MCH: 28.1 pg (ref 26.0–34.0)
MCHC: 31.9 g/dL (ref 30.0–36.0)
MCV: 87.9 fL (ref 78.0–100.0)
MONO ABS: 0.7 10*3/uL (ref 0.1–1.0)
MONOS PCT: 6 %
Neutro Abs: 7.1 10*3/uL (ref 1.7–7.7)
Neutrophils Relative %: 70 %
PLATELETS: 439 10*3/uL — AB (ref 150–400)
RBC: 4.56 MIL/uL (ref 3.87–5.11)
RDW: 13.3 % (ref 11.5–15.5)
WBC: 10.2 10*3/uL (ref 4.0–10.5)

## 2016-07-19 DIAGNOSIS — I10 Essential (primary) hypertension: Secondary | ICD-10-CM | POA: Diagnosis not present

## 2016-07-19 DIAGNOSIS — Z1389 Encounter for screening for other disorder: Secondary | ICD-10-CM | POA: Diagnosis not present

## 2016-07-19 DIAGNOSIS — Z681 Body mass index (BMI) 19 or less, adult: Secondary | ICD-10-CM | POA: Diagnosis not present

## 2016-07-19 DIAGNOSIS — G20 Parkinson's disease: Secondary | ICD-10-CM | POA: Diagnosis not present

## 2016-07-19 DIAGNOSIS — I4891 Unspecified atrial fibrillation: Secondary | ICD-10-CM | POA: Diagnosis not present

## 2016-07-19 DIAGNOSIS — S72009D Fracture of unspecified part of neck of unspecified femur, subsequent encounter for closed fracture with routine healing: Secondary | ICD-10-CM | POA: Diagnosis not present

## 2016-07-20 DIAGNOSIS — I4891 Unspecified atrial fibrillation: Secondary | ICD-10-CM | POA: Diagnosis not present

## 2016-07-20 DIAGNOSIS — W06XXXD Fall from bed, subsequent encounter: Secondary | ICD-10-CM | POA: Diagnosis not present

## 2016-07-20 DIAGNOSIS — Z950 Presence of cardiac pacemaker: Secondary | ICD-10-CM | POA: Diagnosis not present

## 2016-07-20 DIAGNOSIS — S72002D Fracture of unspecified part of neck of left femur, subsequent encounter for closed fracture with routine healing: Secondary | ICD-10-CM | POA: Diagnosis not present

## 2016-07-20 DIAGNOSIS — M549 Dorsalgia, unspecified: Secondary | ICD-10-CM | POA: Diagnosis not present

## 2016-07-20 DIAGNOSIS — Z96642 Presence of left artificial hip joint: Secondary | ICD-10-CM | POA: Diagnosis not present

## 2016-07-20 DIAGNOSIS — E039 Hypothyroidism, unspecified: Secondary | ICD-10-CM | POA: Diagnosis not present

## 2016-07-20 DIAGNOSIS — G8929 Other chronic pain: Secondary | ICD-10-CM | POA: Diagnosis not present

## 2016-07-20 DIAGNOSIS — G20 Parkinson's disease: Secondary | ICD-10-CM | POA: Diagnosis not present

## 2016-07-20 DIAGNOSIS — M1990 Unspecified osteoarthritis, unspecified site: Secondary | ICD-10-CM | POA: Diagnosis not present

## 2016-07-21 DIAGNOSIS — S72002D Fracture of unspecified part of neck of left femur, subsequent encounter for closed fracture with routine healing: Secondary | ICD-10-CM | POA: Diagnosis not present

## 2016-07-21 DIAGNOSIS — G20 Parkinson's disease: Secondary | ICD-10-CM | POA: Diagnosis not present

## 2016-07-21 DIAGNOSIS — I4891 Unspecified atrial fibrillation: Secondary | ICD-10-CM | POA: Diagnosis not present

## 2016-07-21 DIAGNOSIS — G8929 Other chronic pain: Secondary | ICD-10-CM | POA: Diagnosis not present

## 2016-07-21 DIAGNOSIS — Z96642 Presence of left artificial hip joint: Secondary | ICD-10-CM | POA: Diagnosis not present

## 2016-07-21 DIAGNOSIS — M549 Dorsalgia, unspecified: Secondary | ICD-10-CM | POA: Diagnosis not present

## 2016-07-22 DIAGNOSIS — G8929 Other chronic pain: Secondary | ICD-10-CM | POA: Diagnosis not present

## 2016-07-22 DIAGNOSIS — Z96642 Presence of left artificial hip joint: Secondary | ICD-10-CM | POA: Diagnosis not present

## 2016-07-22 DIAGNOSIS — M549 Dorsalgia, unspecified: Secondary | ICD-10-CM | POA: Diagnosis not present

## 2016-07-22 DIAGNOSIS — G20 Parkinson's disease: Secondary | ICD-10-CM | POA: Diagnosis not present

## 2016-07-22 DIAGNOSIS — I4891 Unspecified atrial fibrillation: Secondary | ICD-10-CM | POA: Diagnosis not present

## 2016-07-22 DIAGNOSIS — H04123 Dry eye syndrome of bilateral lacrimal glands: Secondary | ICD-10-CM | POA: Diagnosis not present

## 2016-07-22 DIAGNOSIS — S72002D Fracture of unspecified part of neck of left femur, subsequent encounter for closed fracture with routine healing: Secondary | ICD-10-CM | POA: Diagnosis not present

## 2016-07-25 DIAGNOSIS — G20 Parkinson's disease: Secondary | ICD-10-CM | POA: Diagnosis not present

## 2016-07-25 DIAGNOSIS — S72002D Fracture of unspecified part of neck of left femur, subsequent encounter for closed fracture with routine healing: Secondary | ICD-10-CM | POA: Diagnosis not present

## 2016-07-25 DIAGNOSIS — M549 Dorsalgia, unspecified: Secondary | ICD-10-CM | POA: Diagnosis not present

## 2016-07-25 DIAGNOSIS — G8929 Other chronic pain: Secondary | ICD-10-CM | POA: Diagnosis not present

## 2016-07-25 DIAGNOSIS — I4891 Unspecified atrial fibrillation: Secondary | ICD-10-CM | POA: Diagnosis not present

## 2016-07-25 DIAGNOSIS — Z96642 Presence of left artificial hip joint: Secondary | ICD-10-CM | POA: Diagnosis not present

## 2016-07-26 ENCOUNTER — Other Ambulatory Visit: Payer: Self-pay | Admitting: Cardiovascular Disease

## 2016-07-26 DIAGNOSIS — Z96642 Presence of left artificial hip joint: Secondary | ICD-10-CM | POA: Diagnosis not present

## 2016-07-26 DIAGNOSIS — G8929 Other chronic pain: Secondary | ICD-10-CM | POA: Diagnosis not present

## 2016-07-26 DIAGNOSIS — M549 Dorsalgia, unspecified: Secondary | ICD-10-CM | POA: Diagnosis not present

## 2016-07-26 DIAGNOSIS — S72002D Fracture of unspecified part of neck of left femur, subsequent encounter for closed fracture with routine healing: Secondary | ICD-10-CM | POA: Diagnosis not present

## 2016-07-26 DIAGNOSIS — G20 Parkinson's disease: Secondary | ICD-10-CM | POA: Diagnosis not present

## 2016-07-26 DIAGNOSIS — I4891 Unspecified atrial fibrillation: Secondary | ICD-10-CM | POA: Diagnosis not present

## 2016-07-27 DIAGNOSIS — Z96642 Presence of left artificial hip joint: Secondary | ICD-10-CM | POA: Diagnosis not present

## 2016-07-27 DIAGNOSIS — G20 Parkinson's disease: Secondary | ICD-10-CM | POA: Diagnosis not present

## 2016-07-27 DIAGNOSIS — G8929 Other chronic pain: Secondary | ICD-10-CM | POA: Diagnosis not present

## 2016-07-27 DIAGNOSIS — S72002D Fracture of unspecified part of neck of left femur, subsequent encounter for closed fracture with routine healing: Secondary | ICD-10-CM | POA: Diagnosis not present

## 2016-07-27 DIAGNOSIS — M549 Dorsalgia, unspecified: Secondary | ICD-10-CM | POA: Diagnosis not present

## 2016-07-27 DIAGNOSIS — I4891 Unspecified atrial fibrillation: Secondary | ICD-10-CM | POA: Diagnosis not present

## 2016-07-28 DIAGNOSIS — I4891 Unspecified atrial fibrillation: Secondary | ICD-10-CM | POA: Diagnosis not present

## 2016-07-28 DIAGNOSIS — M549 Dorsalgia, unspecified: Secondary | ICD-10-CM | POA: Diagnosis not present

## 2016-07-28 DIAGNOSIS — Z96642 Presence of left artificial hip joint: Secondary | ICD-10-CM | POA: Diagnosis not present

## 2016-07-28 DIAGNOSIS — S72002D Fracture of unspecified part of neck of left femur, subsequent encounter for closed fracture with routine healing: Secondary | ICD-10-CM | POA: Diagnosis not present

## 2016-07-28 DIAGNOSIS — G20 Parkinson's disease: Secondary | ICD-10-CM | POA: Diagnosis not present

## 2016-07-28 DIAGNOSIS — G8929 Other chronic pain: Secondary | ICD-10-CM | POA: Diagnosis not present

## 2016-08-01 DIAGNOSIS — G8929 Other chronic pain: Secondary | ICD-10-CM | POA: Diagnosis not present

## 2016-08-01 DIAGNOSIS — Z96642 Presence of left artificial hip joint: Secondary | ICD-10-CM | POA: Diagnosis not present

## 2016-08-01 DIAGNOSIS — I4891 Unspecified atrial fibrillation: Secondary | ICD-10-CM | POA: Diagnosis not present

## 2016-08-01 DIAGNOSIS — G20 Parkinson's disease: Secondary | ICD-10-CM | POA: Diagnosis not present

## 2016-08-01 DIAGNOSIS — S72002D Fracture of unspecified part of neck of left femur, subsequent encounter for closed fracture with routine healing: Secondary | ICD-10-CM | POA: Diagnosis not present

## 2016-08-01 DIAGNOSIS — M549 Dorsalgia, unspecified: Secondary | ICD-10-CM | POA: Diagnosis not present

## 2016-08-02 ENCOUNTER — Encounter (HOSPITAL_COMMUNITY)
Admission: RE | Admit: 2016-08-02 | Payer: Medicare Other | Source: Skilled Nursing Facility | Attending: *Deleted | Admitting: *Deleted

## 2016-08-02 DIAGNOSIS — G20 Parkinson's disease: Secondary | ICD-10-CM | POA: Diagnosis not present

## 2016-08-02 DIAGNOSIS — I4891 Unspecified atrial fibrillation: Secondary | ICD-10-CM | POA: Diagnosis not present

## 2016-08-02 DIAGNOSIS — Z96642 Presence of left artificial hip joint: Secondary | ICD-10-CM | POA: Diagnosis not present

## 2016-08-02 DIAGNOSIS — G8929 Other chronic pain: Secondary | ICD-10-CM | POA: Diagnosis not present

## 2016-08-02 DIAGNOSIS — Z1389 Encounter for screening for other disorder: Secondary | ICD-10-CM | POA: Diagnosis not present

## 2016-08-02 DIAGNOSIS — Z0000 Encounter for general adult medical examination without abnormal findings: Secondary | ICD-10-CM | POA: Diagnosis not present

## 2016-08-02 DIAGNOSIS — M549 Dorsalgia, unspecified: Secondary | ICD-10-CM | POA: Diagnosis not present

## 2016-08-02 DIAGNOSIS — Z681 Body mass index (BMI) 19 or less, adult: Secondary | ICD-10-CM | POA: Diagnosis not present

## 2016-08-02 DIAGNOSIS — S72002D Fracture of unspecified part of neck of left femur, subsequent encounter for closed fracture with routine healing: Secondary | ICD-10-CM | POA: Diagnosis not present

## 2016-08-03 DIAGNOSIS — G8929 Other chronic pain: Secondary | ICD-10-CM | POA: Diagnosis not present

## 2016-08-03 DIAGNOSIS — I4891 Unspecified atrial fibrillation: Secondary | ICD-10-CM | POA: Diagnosis not present

## 2016-08-03 DIAGNOSIS — G20 Parkinson's disease: Secondary | ICD-10-CM | POA: Diagnosis not present

## 2016-08-03 DIAGNOSIS — Z96642 Presence of left artificial hip joint: Secondary | ICD-10-CM | POA: Diagnosis not present

## 2016-08-03 DIAGNOSIS — M549 Dorsalgia, unspecified: Secondary | ICD-10-CM | POA: Diagnosis not present

## 2016-08-03 DIAGNOSIS — S72002D Fracture of unspecified part of neck of left femur, subsequent encounter for closed fracture with routine healing: Secondary | ICD-10-CM | POA: Diagnosis not present

## 2016-08-04 ENCOUNTER — Ambulatory Visit (INDEPENDENT_AMBULATORY_CARE_PROVIDER_SITE_OTHER): Payer: Medicare Other | Admitting: Cardiovascular Disease

## 2016-08-04 ENCOUNTER — Encounter: Payer: Self-pay | Admitting: Cardiovascular Disease

## 2016-08-04 VITALS — BP 138/72 | HR 78 | Ht 66.0 in | Wt 122.0 lb

## 2016-08-04 DIAGNOSIS — G903 Multi-system degeneration of the autonomic nervous system: Secondary | ICD-10-CM | POA: Diagnosis not present

## 2016-08-04 DIAGNOSIS — Z7901 Long term (current) use of anticoagulants: Secondary | ICD-10-CM

## 2016-08-04 DIAGNOSIS — I480 Paroxysmal atrial fibrillation: Secondary | ICD-10-CM | POA: Diagnosis not present

## 2016-08-04 DIAGNOSIS — Z950 Presence of cardiac pacemaker: Secondary | ICD-10-CM

## 2016-08-04 DIAGNOSIS — E785 Hyperlipidemia, unspecified: Secondary | ICD-10-CM

## 2016-08-04 DIAGNOSIS — E441 Mild protein-calorie malnutrition: Secondary | ICD-10-CM | POA: Diagnosis not present

## 2016-08-04 DIAGNOSIS — I495 Sick sinus syndrome: Secondary | ICD-10-CM | POA: Diagnosis not present

## 2016-08-04 NOTE — Progress Notes (Signed)
Patient ID: Abigail Taylor, female   DOB: 1931-07-28, 83 y.o.   MRN: [id number]    Cardiology Office Note    Date:  08/04/2016   ID:  Abigail Taylor, DOB 1931-06-28, MRN [id number]  PCP:  [name], MD  Cardiologist:   [name], MD   No chief complaint on file.   History of Present Illness:  Abigail Taylor is a 83 y.o. female with a history of sinus node dysfunction, recurrent paroxysmal and persistent atrial fibrillation with rapid ventricular response, tachycardia-bradycardia syndrome, status post implantation of a dual-chamber permanent pacemaker (Medtronic Adapta), Orthostatic hypotension related to Parkinson's syndrome/Autonomic dysfunction.  She was scheduled for a cardioversion in March, but her rhythm converted just before she had to be shocked.  She fell out of bed and fractured her left hip in late May. She has recovered well from hemiarthroplasty and is walking with a walker. Her appetite is poor and she is now underweight. She is trying to drink Ensure or boost. Continues to have episodes of symptomatic orthostatic hypotension. She is no longer taking sustained-release diltiazem, but does have immediate release diltiazem available as needed for rapid palpitations. She is compliant with her anticoagulant. Fludrocortisone was discontinued when she went to the [facility] rehabilitation center, but she has restarted now that she is at home. She denies recent problems with syncope.  Normal pacemaker function. Her Medtronic Adapta dual-chamber device was implanted in 2012 and has roughly 11 years of remaining longevity. She has 24% atrial pacing and less than 1% ventricular pacing. The burden of atrial fibrillation is largely unchanged at 1.6%. Most recently she had a 17 hour episode of atrial fibrillation on May 9 that was asymptomatic. The ventricular rate was 127 bpm. She still has occasional very brief bursts of paroxysmal atrial tachycardia with ventricular rates of 150  bpm.  She is taking anticoagulation with Eliquis without any complications (lower dose for age and low body weight). She is on long-standing treatment with statin for hyperlipidemia. She had normal coronary arteries by remote angiogram performed in 2001 and a normal nuclear stress test in 2013.    Past Medical History:  Diagnosis Date  . Angina at rest, with the tachycardia 07/05/2011  . Aortic insufficiency 07/05/2011  . Atrial fibrillation ([place])   . Brady-tachy syndrome ([place])    Dual chamber Medtronic pacemaker Adapta  . Chronic back pain   . Constipation 12/27/2011  . Gait instability   . Hyperlipidemia   . Hypothyroidism   . Normal cardiac stress test 2013  . OA (osteoarthritis)   . Pacemaker   . Parkinson's disease ([place])   . Tremor     Past Surgical History:  Procedure Laterality Date  . ABDOMINAL HYSTERECTOMY    . CARDIAC CATHETERIZATION  05/26/1999   normal  . CARDIAC CATHETERIZATION  2001   normal coronary arteries  . COLONOSCOPY N/A 11/15/2012   Procedure: COLONOSCOPY;  Surgeon: [name], MD;  [facility];  Service: Endoscopy;  Laterality: N/A;  1030  . HIP ARTHROPLASTY Left 06/24/2016   Procedure: ARTHROPLASTY BIPOLAR HIP (HEMIARTHROPLASTY);  Surgeon: Altamese [place], MD;  Location: [facility];  Service: Orthopedics;  Laterality: Left;  . INSERT / REPLACE / REMOVE PACEMAKER    . NM MYOVIEW LTD  07/19/2011   normal  . PERMANENT PACEMAKER INSERTION  07/05/2011   Medtronic Adapta dual chamber  . PERMANENT PACEMAKER INSERTION N/A 07/05/2011   Procedure: PERMANENT PACEMAKER INSERTION;  Surgeon: [name], MD;  Location: [facility]  CATH LAB;  Service: Cardiovascular;  Laterality: N/A;    Current Medications: Outpatient Medications Prior to Visit  Medication Sig Dispense Refill  . acetaminophen (TYLENOL) 325 MG tablet Take 2 tablets (650 mg total) by mouth every 6 (six) hours as needed for mild pain (or Fever >/= 101).    . carbidopa-levodopa (SINEMET IR) 25-100 MG  per tablet Take 1 tablet by mouth 3 (three) times daily.     . cycloSPORINE (RESTASIS) 0.05 % ophthalmic emulsion 1 drop at bedtime.    Marland Kitchen ELIQUIS 2.5 MG TABS tablet TAKE (1) TABLET BY MOUTH TWICE DAILY. 180 tablet 1  . hydroxypropyl methylcellulose / hypromellose (ISOPTO TEARS / GONIOVISC) 2.5 % ophthalmic solution Place 1 drop into both eyes as needed for dry eyes (uses ointment).    . rosuvastatin (CRESTOR) 10 MG tablet Take 10 mg by mouth at bedtime. Reported on 07/09/2015    . senna-docusate (SENOKOT-S) 8.6-50 MG tablet Take 1 tablet by mouth at bedtime as needed for mild constipation.    . bisacodyl (DULCOLAX) 10 MG suppository Place 10 mg rectally as needed for moderate constipation.    Marland Kitchen levothyroxine (SYNTHROID, LEVOTHROID) 75 MCG tablet Take 75 mcg by mouth daily before breakfast.     No facility-administered medications prior to visit.      Allergies:   Patient has no known allergies.   Social History   Social History  . Marital status: Married    Spouse name: N/A  . Number of children: N/A  . Years of education: N/A   Social History Main Topics  . Smoking status: Never Smoker  . Smokeless tobacco: Never Used  . Alcohol use No  . Drug use: No  . Sexual activity: Not Asked   Other Topics Concern  . None   Social History Narrative  . None     Family History:  The patient's family history includes Cancer in her father; Colon cancer (age of onset: 27) in her mother; Heart attack in her mother.   ROS:   Please see the history of present illness.    ROS All other systems reviewed and are negative.   PHYSICAL EXAM:   VS:  BP 138/72   Pulse 78   Ht 5\' 6"  (1.676 m)   Wt 122 lb (55.3 kg)   BMI 19.69 kg/m     General: Alert, oriented x3, no distress. Appears very thin and frail Head: no evidence of trauma, PERRL, EOMI, no exophtalmos or lid lag, no myxedema, no xanthelasma; normal ears, nose and oropharynx Neck: normal jugular venous pulsations and no hepatojugular  reflux; brisk carotid pulses without delay and no carotid bruits Chest: clear to auscultation, no signs of consolidation by percussion or palpation, normal fremitus, symmetrical and full respiratory excursions Cardiovascular: normal position and quality of the apical impulse, regular rhythm, normal first and widely split second heart sounds, no murmurs, rubs or gallops. Healthy left subclavian pacemaker site Abdomen: no tenderness or distention, no masses by palpation, no abnormal pulsatility or arterial bruits, normal bowel sounds, no hepatosplenomegaly Extremities: no clubbing, cyanosis or edema; 2+ radial, ulnar and brachial pulses bilaterally; 2+ right femoral, posterior tibial and dorsalis pedis pulses; 2+ left femoral, posterior tibial and dorsalis pedis pulses; no subclavian or femoral bruits Neurological: grossly nonfocal   Wt Readings from Last 3 Encounters:  08/04/16 122 lb (55.3 kg)  06/23/16 130 lb (59 kg)  04/21/16 121 lb 6.4 oz (55.1 kg)      Studies/Labs Reviewed:   EKG:  EKG is not ordered today. The intracardiac electrogram shows atrial sensed, ventricular sensed rhythm consistent with normal sinus rhythm  Recent Labs: 03/03/2016: Magnesium 1.8 06/23/2016: ALT 10 07/05/2016: TSH 6.827 07/15/2016: BUN 21; Creatinine, Ser 0.66; Hemoglobin 12.8; Platelets 439; Potassium 3.9; Sodium 140   Lipid Panel    Component Value Date/Time   CHOL 133 04/30/2015 0853   TRIG 84 04/30/2015 0853   HDL 54 04/30/2015 0853   CHOLHDL 2.5 04/30/2015 0853   VLDL 17 04/30/2015 0853   LDLCALC 62 04/30/2015 0853     ASSESSMENT:    1. Paroxysmal atrial fibrillation (HCC)   2. Long term (current) use of anticoagulants   3. SSS (sick sinus syndrome) ([place])   4. Pacemaker   5. Orthostatic hypotension due to Parkinson's disease ([place])   6. Dyslipidemia   7. Mild protein-calorie malnutrition ([place])      PLAN:  In order of problems listed above:  1. Afib: Arrhythmia burden is very low and  the arrhythmia is asymptomatic. Rate control medications limited by hypotension. Although she does have rapid ventricular response and episodes of atrial fibrillation, daily use of rate control medications is more likely to cause problems with hypotension rather than benefit. Can take low-dose immediate release diltiazem as needed. The overall burden of arrhythmia remains low and antiarrhythmics are not justified. Continue anticoagulation..  2. Eliquis CHADSVasc 3 (age, gender) but no history of previous stroke/TIA or other embolic events. Dose adjusted for age and low body weight 3. SSS: Symptoms well controlled with current pacemaker settings.  4. PM: Normal device function. Continue remote downloads every 3 months. 5. Orthostatic hypotension: Stay well hydrated, continue fludrocortisone, relatively higher sodium diet, avoid caffeine, alcohol and other natural diuretics. At this point ProAmatine or other vasoconstrictors do not appear indicated 6. HLP: Statin. She has lost weight. 7. Malnutrition: Discussed minimal caloric requirements and need for high value protein intake.    Medication Adjustments/Labs and Tests Ordered: Current medicines are reviewed at length with the patient today.  Concerns regarding medicines are outlined above.  Medication changes, Labs and Tests ordered today are listed in the Patient Instructions below. Patient Instructions  Dr Sallyanne Kuster recommends that you continue on your current medications as directed. Please refer to the Current Medication list given to you today.  Remote monitoring is used to monitor your Pacemaker of ICD from home. This monitoring reduces the number of office visits required to check your device to one time per year. It allows [place] to keep an eye on the functioning of your device to ensure it is working properly. You are scheduled for a device check from home on Thursday, October 11th, 2018. You may send your transmission at any time that day. If you  have a wireless device, the transmission will be sent automatically. After your physician reviews your transmission, you will receive a postcard with your next transmission date.  Dr Sallyanne Kuster recommends that you schedule a follow-up appointment in 6 months with a pacemaker check. You will receive a reminder letter in the mail two months in advance. If you don't receive a letter, please call our office to schedule the follow-up appointment.  If you need a refill on your cardiac medications before your next appointment, please call your pharmacy.    Signed, [name], MD  08/04/2016 1:49 PM    [facility] [place], [place], [place]  [address] Phone: [phone]; Fax: [phone]

## 2016-08-04 NOTE — Patient Instructions (Signed)
Dr Sallyanne Kuster recommends that you continue on your current medications as directed. Please refer to the Current Medication list given to you today.  Remote monitoring is used to monitor your Pacemaker of ICD from home. This monitoring reduces the number of office visits required to check your device to one time per year. It allows [place] to keep an eye on the functioning of your device to ensure it is working properly. You are scheduled for a device check from home on Thursday, October 11th, 2018. You may send your transmission at any time that day. If you have a wireless device, the transmission will be sent automatically. After your physician reviews your transmission, you will receive a postcard with your next transmission date.  Dr Sallyanne Kuster recommends that you schedule a follow-up appointment in 6 months with a pacemaker check. You will receive a reminder letter in the mail two months in advance. If you don't receive a letter, please call our office to schedule the follow-up appointment.  If you need a refill on your cardiac medications before your next appointment, please call your pharmacy.

## 2016-08-10 DIAGNOSIS — S72012D Unspecified intracapsular fracture of left femur, subsequent encounter for closed fracture with routine healing: Secondary | ICD-10-CM | POA: Diagnosis not present

## 2016-08-15 DIAGNOSIS — I739 Peripheral vascular disease, unspecified: Secondary | ICD-10-CM | POA: Diagnosis not present

## 2016-08-29 ENCOUNTER — Telehealth: Payer: Self-pay | Admitting: Cardiovascular Disease

## 2016-08-29 NOTE — Telephone Encounter (Signed)
New message   Pt c/o medication issue:  1. Name of Medication: ELIQUIS 2.5 MG TABS tablet  2. How are you currently taking this medication (dosage and times per day)? 2.5mg   3. Are you having a reaction (difficulty breathing--STAT)? no  4. What is your medication issue? Pt states she cannot afford this medication anymore and wants an alternative because it is over $500.00

## 2016-08-29 NOTE — Telephone Encounter (Signed)
Returned call to patient regarding her Eliquis Rx. She states Eliquis cost her $500/90 day supply. Will mail a patient assistance application to patient. Advised her to see if she qualifies by requesting info from pharmacy and if she does, please call our office so MD can complete his section of application. Verified address. Routed to [place], [place] as [employer]

## 2016-08-31 ENCOUNTER — Encounter: Payer: Medicare Other | Admitting: *Deleted

## 2016-09-01 ENCOUNTER — Encounter: Payer: Self-pay | Admitting: Cardiology

## 2016-09-05 ENCOUNTER — Ambulatory Visit: Payer: Medicare Other | Admitting: Physical Therapy

## 2016-09-07 ENCOUNTER — Encounter (HOSPITAL_COMMUNITY): Payer: Self-pay | Admitting: *Deleted

## 2016-09-07 ENCOUNTER — Emergency Department (HOSPITAL_COMMUNITY)
Admission: EM | Admit: 2016-09-07 | Discharge: 2016-09-07 | Disposition: A | Discharge status: Home / Self Care | Payer: Medicare Other | Attending: Emergency Medicine | Admitting: Emergency Medicine

## 2016-09-07 DIAGNOSIS — G20 Parkinson's disease: Secondary | ICD-10-CM | POA: Diagnosis not present

## 2016-09-07 DIAGNOSIS — S51012A Laceration without foreign body of left elbow, initial encounter: Secondary | ICD-10-CM | POA: Insufficient documentation

## 2016-09-07 DIAGNOSIS — S51802A Unspecified open wound of left forearm, initial encounter: Secondary | ICD-10-CM | POA: Diagnosis not present

## 2016-09-07 DIAGNOSIS — Y999 Unspecified external cause status: Secondary | ICD-10-CM | POA: Diagnosis not present

## 2016-09-07 DIAGNOSIS — I10 Essential (primary) hypertension: Secondary | ICD-10-CM | POA: Insufficient documentation

## 2016-09-07 DIAGNOSIS — W541XXA Struck by dog, initial encounter: Secondary | ICD-10-CM | POA: Insufficient documentation

## 2016-09-07 DIAGNOSIS — Y929 Unspecified place or not applicable: Secondary | ICD-10-CM | POA: Insufficient documentation

## 2016-09-07 DIAGNOSIS — Z7901 Long term (current) use of anticoagulants: Secondary | ICD-10-CM | POA: Insufficient documentation

## 2016-09-07 DIAGNOSIS — Z79899 Other long term (current) drug therapy: Secondary | ICD-10-CM | POA: Insufficient documentation

## 2016-09-07 DIAGNOSIS — S59902A Unspecified injury of left elbow, initial encounter: Secondary | ICD-10-CM | POA: Diagnosis present

## 2016-09-07 DIAGNOSIS — Y939 Activity, unspecified: Secondary | ICD-10-CM | POA: Insufficient documentation

## 2016-09-07 MED ORDER — SILVER SULFADIAZINE 1 % EX CREA: 1.0000 "application " | TOPICAL_CREAM | Freq: Every day | CUTANEOUS | 1 refills | Status: DC

## 2016-09-07 MED ORDER — SILVER SULFADIAZINE 1 % EX CREA
TOPICAL_CREAM | CUTANEOUS | Status: AC
  Administered 2016-09-07: 22:00:00 via TOPICAL
  Filled 2016-09-07: qty 50

## 2016-09-07 NOTE — Discharge Instructions (Signed)
You have skin tears involving the left upper extremity. A Silvadene dressing has been applied. Please see Dr.Fusco, or return to the emergency department in 5-7 days for recheck of this area. Please change the dressing daily and apply Silvadene daily.

## 2016-09-07 NOTE — ED Notes (Signed)
Steri-strips applied to large skin tear on right elbow. Silvadene applied to skintears. Wounds dressed with large telfa and kerlix-patient tolerated well.

## 2016-09-07 NOTE — ED Provider Notes (Signed)
[facility] DEPT Provider Note   CSN: [id number] Arrival date & time: 09/07/16  2017     History   Chief Complaint Chief Complaint  Patient presents with  . Laceration    HPI Abigail Taylor is a 83 y.o. female.  Patient is an 83 year old female who presents to the emergency department with a complaint of laceration/skin tear to the left elbow.  The patient states that on Sunday, August 12 she sustained a skin tear on the left elbow. She attempted to put a bandage on it. She then had her dog to jump on her and she sustained another skin tear and reinjured the first skin tear on the same arm. She had problems bandaging this one and so she decided to come to the emergency department for evaluation. She states her last tetanus shot was 3 years ago. No other injury reported.      Past Medical History:  Diagnosis Date  . Angina at rest, with the tachycardia 07/05/2011  . Aortic insufficiency 07/05/2011  . Atrial fibrillation ([place])   . Brady-tachy syndrome ([place])    Dual chamber Medtronic pacemaker Adapta  . Chronic back pain   . Constipation 12/27/2011  . Gait instability   . Hyperlipidemia   . Hypothyroidism   . Normal cardiac stress test 2013  . OA (osteoarthritis)   . Pacemaker   . Parkinson's disease ([place])   . Tremor     Patient Active Problem List   Diagnosis Date Noted  . Tachy-brady syndrome ([place]) 06/23/2016  . Closed subcapital fracture of left femur ([place]) 06/23/2016  . Orthostatic hypotension due to Parkinson's disease ([place]) 06/23/2016  . Parkinson disease ([place]) 04/21/2016  . Hypokalemia 04/21/2016  . Normal coronary arteries 2001 05/08/2014  . Pacemaker 08/10/2012  . Aortic insufficiency 08/10/2012  . Constipation 12/27/2011  . Family hx of colon cancer 12/27/2011  . HTN (hypertension) 07/06/2011  . Hypothyroidism 07/05/2011  . Paroxysmal atrial fibrillation ([place]) 07/05/2011  . Sinus pause, 7 seconds post conversion, S/P MDT pacemaker 07/05/11 07/05/2011   . Dyslipidemia 07/05/2011  . Angina at rest, with rapid AF 07/05/2011    Past Surgical History:  Procedure Laterality Date  . ABDOMINAL HYSTERECTOMY    . CARDIAC CATHETERIZATION  05/26/1999   normal  . CARDIAC CATHETERIZATION  2001   normal coronary arteries  . COLONOSCOPY N/A 11/15/2012   Procedure: COLONOSCOPY;  Surgeon: [name], MD;  [facility];  Service: Endoscopy;  Laterality: N/A;  1030  . HIP ARTHROPLASTY Left 06/24/2016   Procedure: ARTHROPLASTY BIPOLAR HIP (HEMIARTHROPLASTY);  Surgeon: Altamese Cana, MD;  Location: [facility];  Service: Orthopedics;  Laterality: Left;  . INSERT / REPLACE / REMOVE PACEMAKER    . NM MYOVIEW LTD  07/19/2011   normal  . PERMANENT PACEMAKER INSERTION  07/05/2011   Medtronic Adapta dual chamber  . PERMANENT PACEMAKER INSERTION N/A 07/05/2011   Procedure: PERMANENT PACEMAKER INSERTION;  Surgeon: [name], MD;  Location: [facility] CATH [facility];  Service: Cardiovascular;  Laterality: N/A;    OB History    No data available       Home Medications    Prior to Admission medications   Medication Sig Start Date End Date Taking? Authorizing Provider  acetaminophen (TYLENOL) 325 MG tablet Take 2 tablets (650 mg total) by mouth every 6 (six) hours as needed for mild pain (or Fever >/= 101). 06/27/16   Alphonzo Grieve, MD  carbidopa-levodopa (SINEMET IR) 25-100 MG per tablet Take 1 tablet by mouth 3 (  three) times daily.     [provider]  cycloSPORINE (RESTASIS) 0.05 % ophthalmic emulsion 1 drop at bedtime.    [provider]  diltiazem (CARDIZEM) 30 MG tablet Take 30 mg by mouth daily as needed.    [provider]  ELIQUIS 2.5 MG TABS tablet TAKE (1) TABLET BY MOUTH TWICE DAILY. 05/02/16   Croitoru, Mihai, MD  fludrocortisone (FLORINEF) 0.1 MG tablet Take 0.1 mg by mouth daily.    [provider]  hydroxypropyl methylcellulose / hypromellose (ISOPTO TEARS / GONIOVISC) 2.5 % ophthalmic solution Place 1 drop into  both eyes as needed for dry eyes (uses ointment).    [provider]  levothyroxine (SYNTHROID, LEVOTHROID) 50 MCG tablet Take 50 mcg by mouth daily before breakfast.    [provider]  rosuvastatin (CRESTOR) 10 MG tablet Take 10 mg by mouth at bedtime. Reported on 07/09/2015    [provider]  senna-docusate (SENOKOT-S) 8.6-50 MG tablet Take 1 tablet by mouth at bedtime as needed for mild constipation. 06/27/16   Alphonzo Grieve, MD    Family History Family History  Problem Relation Age of Onset  . Colon cancer Mother 43  . Heart attack Mother   . Cancer Father     Social History Social History  Substance Use Topics  . Smoking status: Never Smoker  . Smokeless tobacco: Never Used  . Alcohol use No     Allergies   Patient has no known allergies.   Review of Systems Review of Systems  Constitutional: Negative for activity change.       All ROS Neg except as noted in HPI  HENT: Negative for nosebleeds.   Eyes: Negative for photophobia and discharge.  Respiratory: Negative for cough, shortness of breath and wheezing.   Cardiovascular: Negative for chest pain and palpitations.  Gastrointestinal: Negative for abdominal pain and blood in stool.  Genitourinary: Negative for dysuria, frequency and hematuria.  Musculoskeletal: Positive for arthralgias and back pain. Negative for neck pain.  Skin: Negative.        Skin tear/laceration  Neurological: Negative for dizziness, seizures and speech difficulty.  Psychiatric/Behavioral: Negative for confusion and hallucinations.     Physical Exam Updated Vital Signs BP (!) 173/62   Pulse 68   Temp 98.1 F (36.7 C) (Oral)   Resp 17   Ht 5\' 6"  (1.676 m)   Wt 55.3 kg (122 lb)   SpO2 99%   BMI 19.69 kg/m   Physical Exam  Constitutional: She is oriented to person, place, and time. She appears well-developed and well-nourished.  Non-toxic appearance.  HENT:  Head: Normocephalic.  Right Ear: Tympanic  membrane and external ear normal.  Left Ear: Tympanic membrane and external ear normal.  Eyes: Pupils are equal, round, and reactive to light. EOM and lids are normal.  Neck: Normal range of motion. Neck supple. Carotid bruit is not present.  Cardiovascular: Normal rate, regular rhythm, intact distal pulses and normal pulses.   Murmur heard. Pulmonary/Chest: Breath sounds normal. No respiratory distress.  Abdominal: Soft. Bowel sounds are normal. There is no tenderness. There is no guarding.  Musculoskeletal:       Left elbow: She exhibits laceration.  There is a 6.2 x 2.3 cm laceration skin tear/ involving the left elbow. There is a second skin tear that measures 2.4 cm at the mid forearm on the left arm. There is good range of motion of the fingers and wrist and elbow of the left upper extremity. The  radial pulses 2+.  Lymphadenopathy:       Head (right side): No submandibular adenopathy present.       Head (left side): No submandibular adenopathy present.    She has no cervical adenopathy.  Neurological: She is alert and oriented to person, place, and time. She has normal strength. No cranial nerve deficit or sensory deficit.  Skin: Skin is warm and dry.  Psychiatric: She has a normal mood and affect. Her speech is normal.  Nursing note and vitals reviewed.    ED Treatments / Results  Labs (all labs ordered are listed, but only abnormal results are displayed) Labs Reviewed - No data to display  EKG  EKG Interpretation None       Radiology No results found.  Procedures Procedures (including critical care time)  Medications Ordered in ED Medications  silver sulfADIAZINE (SILVADENE) 1 % cream (not administered)     Initial Impression / Assessment and Plan / ED Course  I have reviewed the triage vital signs and the nursing notes.  Pertinent labs & imaging results that were available during my care of the patient were reviewed by me and considered in my medical decision  making (see chart for details).       Final Clinical Impressions(s) / ED Diagnoses MDM Vital signs reviewed. Patient has skin tears of the left upper extremity. Tetanus is up-to-date. These lacerations are not candidates for suture repair. The patient will be treated with Silvadene and sterile dressing applied. The patient is to be followed by her primary physician Dr. Gerarda Fraction, or return to the emergency department if any signs of infection or problem. Patient is in agreement with this plan.    Final diagnoses:  Skin tear of elbow without complication, left, initial encounter    New Prescriptions New Prescriptions   No medications on file     Lily Kocher, Hershal Coria 09/07/16 2140    Francine Graven, DO 09/09/16 1725

## 2016-09-07 NOTE — ED Triage Notes (Addendum)
Pt c/o skin tear to left elbow that happened Sunday and then had additional skin tears when her dog rubbed against her left elbow today, ,

## 2016-09-08 ENCOUNTER — Ambulatory Visit: Payer: Medicare Other | Attending: Orthopedic Surgery | Admitting: Physical Therapy

## 2016-09-08 DIAGNOSIS — R293 Abnormal posture: Secondary | ICD-10-CM | POA: Diagnosis not present

## 2016-09-08 DIAGNOSIS — R2689 Other abnormalities of gait and mobility: Secondary | ICD-10-CM | POA: Diagnosis not present

## 2016-09-08 DIAGNOSIS — R2681 Unsteadiness on feet: Secondary | ICD-10-CM

## 2016-09-08 NOTE — Therapy (Signed)
[facility] [address] [place], [place], [address] Phone: [phone]   Fax:  [phone]  Physical Therapy Evaluation  Patient Details  Name: Abigail Taylor MRN: [id number] Date of Birth: 1931/08/03 Referring Provider: Dr. Marcelino Scot  Encounter Date: 09/08/2016      PT End of Session - 09/08/16 1309    Visit Number 1   Number of Visits 9   Date for PT Re-Evaluation 11/07/16   Authorization Type Medicare Primary, Sent[place] secondary-GCODE every 10th visit   PT Start Time 0846   PT Stop Time 0930   PT Time Calculation (min) 44 min   Activity Tolerance Patient tolerated treatment well   Behavior During Therapy [facility] for tasks assessed/performed      Past Medical History:  Diagnosis Date  . Angina at rest, with the tachycardia 07/05/2011  . Aortic insufficiency 07/05/2011  . Atrial fibrillation ([place])   . Brady-tachy syndrome ([place])    Dual chamber Medtronic pacemaker Adapta  . Chronic back pain   . Constipation 12/27/2011  . Gait instability   . Hyperlipidemia   . Hypothyroidism   . Normal cardiac stress test 2013  . OA (osteoarthritis)   . Pacemaker   . Parkinson's disease ([place])   . Tremor     Past Surgical History:  Procedure Laterality Date  . ABDOMINAL HYSTERECTOMY    . CARDIAC CATHETERIZATION  05/26/1999   normal  . CARDIAC CATHETERIZATION  2001   normal coronary arteries  . COLONOSCOPY N/A 11/15/2012   Procedure: COLONOSCOPY;  Surgeon: [name], MD;  [facility];  Service: Endoscopy;  Laterality: N/A;  1030  . HIP ARTHROPLASTY Left 06/24/2016   Procedure: ARTHROPLASTY BIPOLAR HIP (HEMIARTHROPLASTY);  Surgeon: Altamese Delaware, MD;  Location: [facility];  Service: Orthopedics;  Laterality: Left;  . INSERT / REPLACE / REMOVE PACEMAKER    . NM MYOVIEW LTD  07/19/2011   normal  . PERMANENT PACEMAKER INSERTION  07/05/2011   Medtronic Adapta dual chamber  . PERMANENT PACEMAKER INSERTION N/A 07/05/2011    Procedure: PERMANENT PACEMAKER INSERTION;  Surgeon: [name], MD;  Location: [facility] CATH LAB;  Service: Cardiovascular;  Laterality: N/A;    There were no vitals filed for this visit.       Subjective Assessment - 09/08/16 0850    Subjective Pt had a fall out of bed 06/21/16, and fractured L femoral neck, with L hip hemiarthroplasty.  No other falls.  Dr. Lily Lovings is the neurologist who follows her.  She feels so slow, like motor skills have been taken away.  Prior to fall, patient was independent.   Patient Stated Goals Pt's goal for therapy is to improve balance and walking (upon recommendation of HHPT).   Currently in Pain? No/denies            [facility] PT Assessment - 09/08/16 0854      Assessment   Medical Diagnosis Parkinson's disease s/p L hip hemiarthroplasty   Referring Provider Dr. Marcelino Scot   Onset Date/Surgical Date 06/21/16   Prior [facility] x 3 weeks, HHPT     Precautions   Precautions Fall;Posterior Hip   Precaution Comments Avoid bending >90 degrees, crossing legs, avoid toes pointing in per patient report     Restrictions   Weight Bearing Restrictions No     Balance Screen   Has the patient fallen in the past 6 months Yes   How many times? 1  one fall from bed while sleeping  Has the patient had a decrease in activity level because of a fear of falling?  No   Is the patient reluctant to leave their home because of a fear of falling?  No     Home Social worker Private residence   Living Arrangements Spouse/significant other   Available Help at Discharge Family   Type of [place] to enter  one step   Entrance Stairs-Number of Steps 1   Entrance Stairs-Rails None   Home Layout One level   [place] - single point;Walker - 2 wheels   Additional Comments Per Dr. Juanetta Snow use cane or no device     Prior Function   Level of Independence Independent with basic ADLs;Independent with  household mobility without device;Independent with homemaking with ambulation   Leisure Enjoys camping with Motor Home     Observation/Other Assessments   Focus on Therapeutic Outcomes (FOTO)  NA     Posture/Postural Control   Posture/Postural Control Postural limitations   Postural Limitations Forward head;Rounded Shoulders     ROM / Strength   AROM / PROM / Strength Strength     Strength   Overall Strength Comments Grossly tested at least 4/5 bilateral lower extremities     Transfers   Transfers Sit to Stand;Stand to Sit   Sit to Stand 6: Modified independent (Device/Increase time);With upper extremity assist;Without upper extremity assist;From chair/3-in-1   Stand to Sit 6: Modified independent (Device/Increase time);With upper extremity assist;Without upper extremity assist;To chair/3-in-1     Ambulation/Gait   Ambulation/Gait Yes   Ambulation/Gait Assistance 5: Supervision   Ambulation Distance (Feet) 200 Feet   Assistive device None;Straight cane   Gait Pattern Step-through pattern;Decreased arm swing - right;Decreased arm swing - left;Decreased step length - right;Decreased step length - left;Decreased trunk rotation;Trunk flexed;Narrow base of support   Ambulation Surface Level;Indoor   Gait velocity 14.38 sec = 2.28 ft/sec     Standardized Balance Assessment   Standardized Balance Assessment Timed Up and Go Test;Dynamic Gait Index;Berg Balance Test     Berg Balance Test   Sit to Stand Able to stand without using hands and stabilize independently   Standing Unsupported Able to stand safely 2 minutes   Sitting with Back Unsupported but Feet Supported on Floor or Stool Able to sit safely and securely 2 minutes   Stand to Sit Sits safely with minimal use of hands   Transfers Able to transfer safely, minor use of hands   Standing Unsupported with Eyes Closed Able to stand 10 seconds with supervision   Standing Ubsupported with Feet Together Able to place feet together  independently and stand 1 minute safely   From Standing, Reach Forward with Outstretched Arm Can reach confidently >25 cm (10")   From Standing Position, Pick up Object from Floor Unable to try/needs assist to keep balance   From Standing Position, Turn to Look Behind Over each Shoulder Looks behind from both sides and weight shifts well   Turn 360 Degrees Able to turn 360 degrees safely but slowly   Standing Unsupported, Alternately Place Feet on Step/Stool Able to stand independently and complete 8 steps >20 seconds   Standing Unsupported, One Foot in Front Able to take small step independently and hold 30 seconds   Standing on One Leg Able to lift leg independently and hold > 10 seconds   Total Score 46   Berg comment: Scores <45/56 indicate increased fall risk.  Dynamic Gait Index   Level Surface Mild Impairment   Change in Gait Speed Mild Impairment   Gait with Horizontal Head Turns Mild Impairment   Gait with Vertical Head Turns Mild Impairment   Gait and Pivot Turn Mild Impairment   Step Over Obstacle Moderate Impairment   Step Around Obstacles Mild Impairment   Steps Moderate Impairment   Total Score 14   DGI comment: Scores <19/24 indicate increased fall risk.     Timed Up and Go Test   Normal TUG (seconds) 24.25   Manual TUG (seconds) 18.93   Cognitive TUG (seconds) 28.53   TUG Comments Scores >13.5-15 seconds indicate increased fall risk.            Objective measurements completed on examination: See above findings.                       PT Long Term Goals - 09/08/16 1823      PT LONG TERM GOAL #1   Title Pt will be independent with HEP for improved balance, strength and gait.   Time 4   Period Weeks   Status New   Target Date 10/07/16     PT LONG TERM GOAL #2   Title Pt will improve TUG socre to less than or equal to 18 seconds for decreased fall risk.   Time 4   Period Weeks   Status New   Target Date 10/07/16     PT LONG TERM  GOAL #3   Title Pt will improve DGI score to at least 18/24 for decreased fall risk.   Time 4   Period Weeks   Status New   Target Date 10/07/16     PT LONG TERM GOAL #4   Title Pt will ambulate at least 500 ft, indoor and outdoor surfaces, independently, with no LOB for improved gait safety and independence.   Time 4   Period Weeks   Status New   Target Date 10/07/16     PT LONG TERM GOAL #5   Title Pt will verbalize understanding of fall prevention in the home environment.   Time 4   Period Weeks   Status New   Target Date 10/07/16                Plan - 09/08/16 1814    Clinical Impression Statement Pt is an 83 year old female who presents to OPPT with history of Parkinson's disease.  Pt had a fall out of bed 06/21/16, where she sustained L femoral neck fracture and underwent L hip hemiarthroplasty.  She participated in rehab at [facility] as well as HHPT.  She presents with decreased muscle strength, decreased balance, decreased independence/safety with gait, abnormal posture.  She is at high fall risk per TUG and DGI scores; she is at moderate fall risk per Berg score.  Patient would benefit from skilled PT to address the above deficits to decrease fall risk and improve functional mobility.  Prior to fall and fracture, pt was independent with mobility and household/cooking tasks.  She has progressed from using walker after fall to cane for outdoor gait only at this time.   History and Personal Factors relevant to plan of care: PMH >3 co-morbidities; independent prior to fall and fracture; at fall risk per TUG, Berg, DGI scores   Clinical Presentation Stable   Clinical Presentation due to: history of PD; fracture and hemiarthroplasty L hip 06/21/16   Clinical Decision Making Low  Rehab Potential Good   PT Frequency 2x / week   PT Duration 4 weeks   PT Treatment/Interventions ADLs/Self Care Home Management;Gait training;DME Instruction;Functional mobility  training;Therapeutic activities;Therapeutic exercise;Balance training;Neuromuscular re-education;Patient/family education   PT Next Visit Plan Initiate HEP for balance; address static and dynamic balance and gait training   Consulted and Agree with Plan of Care Patient      Patient will benefit from skilled therapeutic intervention in order to improve the following deficits and impairments:  Abnormal gait, Decreased balance, Decreased mobility, Decreased strength, Difficulty walking, Postural dysfunction  Visit Diagnosis: Other abnormalities of gait and mobility  Unsteadiness on feet  Abnormal posture      G-Codes - September 19, 2016 1828    Functional Assessment Tool Used (Outpatient Only) Berg 46/56, TUG 18.93 sec manual, 24.25 sec normal, 28.53 sec cog; DGI 14.24   Functional Limitation Mobility: Walking and moving around   Mobility: Walking and Moving Around Current Status [phone]) At least 20 percent but less than 40 percent impaired, limited or restricted   Mobility: Walking and Moving Around Goal Status [phone]) At least 1 percent but less than 20 percent impaired, limited or restricted       Problem List Patient Active Problem List   Diagnosis Date Noted  . Tachy-brady syndrome ([place]) 06/23/2016  . Closed subcapital fracture of left femur ([place]) 06/23/2016  . Orthostatic hypotension due to Parkinson's disease ([place]) 06/23/2016  . Parkinson disease ([place]) 04/21/2016  . Hypokalemia 04/21/2016  . Normal coronary arteries 2001 05/08/2014  . Pacemaker 08/10/2012  . Aortic insufficiency 08/10/2012  . Constipation 12/27/2011  . Family hx of colon cancer 12/27/2011  . HTN (hypertension) 07/06/2011  . Hypothyroidism 07/05/2011  . Paroxysmal atrial fibrillation ([place]) 07/05/2011  . Sinus pause, 7 seconds post conversion, S/P MDT pacemaker 07/05/11 07/05/2011  . Dyslipidemia 07/05/2011  . Angina at rest, with rapid AF 07/05/2011    Hadja Harral W. 09/19/2016, 6:30 PM  Frazier Butt.,  PT   [facility] [address] [place], [place], [address] Phone: [phone]   Fax:  [phone]  Name: Abigail Taylor MRN: [id number] Date of Birth: Apr 14, 1931

## 2016-09-13 ENCOUNTER — Ambulatory Visit: Payer: Medicare Other | Admitting: Physical Therapy

## 2016-09-13 ENCOUNTER — Encounter: Payer: Self-pay | Admitting: Physical Therapy

## 2016-09-13 DIAGNOSIS — R2681 Unsteadiness on feet: Secondary | ICD-10-CM | POA: Diagnosis not present

## 2016-09-13 DIAGNOSIS — R2689 Other abnormalities of gait and mobility: Secondary | ICD-10-CM

## 2016-09-13 DIAGNOSIS — R293 Abnormal posture: Secondary | ICD-10-CM | POA: Diagnosis not present

## 2016-09-13 NOTE — Patient Instructions (Signed)
Standing Hip Flexion    While standing on one leg, lift your other leg forward with a straight knee as shown. Return to starting position and lift other leg (alternating each time). repeat 20 times for 1 sets.  5 sessions per week.   Use your arms for support if needed for balance and safety.     Standing Hip Abduction    While standing, raise your leg out to the side. Keep your knee straight and maintain your toes pointed forward the entire time.  Alternate each time, right then left. Repeat 20 times for 1 sets.  5 sessions per week.  Use your arms for support if needed for balance and safety.     Heel Raises    Stand with light support at counter. With knees straight, raise heels off ground. Hold _3__ seconds. Lower slowly. Then raise your toes (weight shifts onto your heels).  Repeat __20_ times. Do _1__ times a day. Do 5 times per week.   Copyright  VHI. All rights reserved.

## 2016-09-13 NOTE — Therapy (Signed)
[facility] [address] [place], [place], [address] Phone: [phone]   Fax:  [phone]  Physical Therapy Treatment  Patient Details  Name: Abigail Taylor MRN: [id number] Date of Birth: 18-Nov-1931 Referring Provider: Dr. Marcelino Scot  Encounter Date: 09/13/2016      PT End of Session - 09/13/16 1847    Visit Number 2   Number of Visits 9   Date for PT Re-Evaluation 11/07/16   Authorization Type Medicare Primary, Sentinel secondary-GCODE every 10th visit   PT Start Time 0803   PT Stop Time 0845   PT Time Calculation (min) 42 min   Equipment Utilized During Treatment Gait belt   Activity Tolerance Patient tolerated treatment well   Behavior During Therapy [facility] for tasks assessed/performed      Past Medical History:  Diagnosis Date  . Angina at rest, with the tachycardia 07/05/2011  . Aortic insufficiency 07/05/2011  . Atrial fibrillation ([place])   . Brady-tachy syndrome ([place])    Dual chamber Medtronic pacemaker Adapta  . Chronic back pain   . Constipation 12/27/2011  . Gait instability   . Hyperlipidemia   . Hypothyroidism   . Normal cardiac stress test 2013  . OA (osteoarthritis)   . Pacemaker   . Parkinson's disease ([place])   . Tremor     Past Surgical History:  Procedure Laterality Date  . ABDOMINAL HYSTERECTOMY    . CARDIAC CATHETERIZATION  05/26/1999   normal  . CARDIAC CATHETERIZATION  2001   normal coronary arteries  . COLONOSCOPY N/A 11/15/2012   Procedure: COLONOSCOPY;  Surgeon: [name], MD;  [facility];  Service: Endoscopy;  Laterality: N/A;  1030  . HIP ARTHROPLASTY Left 06/24/2016   Procedure: ARTHROPLASTY BIPOLAR HIP (HEMIARTHROPLASTY);  Surgeon: Altamese Kossuth, MD;  Location: [place];  Service: Orthopedics;  Laterality: Left;  . INSERT / REPLACE / REMOVE PACEMAKER    . NM MYOVIEW LTD  07/19/2011   normal  . PERMANENT PACEMAKER INSERTION  07/05/2011   Medtronic Adapta dual chamber   . PERMANENT PACEMAKER INSERTION N/A 07/05/2011   Procedure: PERMANENT PACEMAKER INSERTION;  Surgeon: [name], MD;  Location: [place] CATH LAB;  Service: Cardiovascular;  Laterality: N/A;    There were no vitals filed for this visit.      Subjective Assessment - 09/13/16 0806    Subjective Diagnosed with PD 2-3 yrs ago. Not sure she has it. Notes she does have tremor sometimes on her right. Actually my whole rt side is effected. (I don't drive, my writing is worse, my memory maybe). I don't do my HEP every day.--probably 3x/week.    Patient Stated Goals Pt's goal for therapy is to improve balance and walking (upon recommendation of HHPT).   Pain Score 3    Pain Location Back   Pain Orientation Lower;Left   Pain Descriptors / Indicators Aching   Multiple Pain Sites Yes   Pain Score 3   Pain Location Leg   Pain Orientation Left   Pain Descriptors / Indicators Burning;Sharp   Pain Type Chronic pain   Pain Onset More than a month ago   Pain Frequency Intermittent                         [facility] Adult PT Treatment/Exercise - 09/13/16 0816      Bed Mobility   Bed Mobility Supine to Sit;Sit to Supine   Supine to Sit 6: Modified independent (Device/Increase time)  Sit to Supine 6: Modified independent (Device/Increase time)     Transfers   Transfers Sit to Stand;Stand to Sit   Sit to Stand 6: Modified independent (Device/Increase time)   Stand to Sit 6: Modified independent (Device/Increase time)     Ambulation/Gait   Ambulation/Gait Yes   Ambulation/Gait Assistance 5: Supervision   Ambulation/Gait Assistance Details for safety; vc for posture   Ambulation Distance (Feet) 120 Feet   Assistive device None   Gait Pattern Step-through pattern;Decreased arm swing - right;Decreased arm swing - left;Decreased step length - right;Decreased step length - left;Decreased trunk rotation;Trunk flexed;Narrow base of support   Ambulation Surface Level     Posture/Postural  Control   Posture/Postural Control Postural limitations   Postural Limitations Forward head;Rounded Shoulders   Posture Comments vc throughout for upright posture; scapular adduction     Exercises   Exercises Other Exercises;Knee/Hip   Other Exercises  All exercises completed today are from her HEP to assess appropriateness with changes made to incr difficulty     Knee/Hip Exercises: Stretches   Other Knee/Hip Stretches HHPT had her do SKC for back pain; cautioned pt this exceeds her 90 degree limit and not to do this exercise     Knee/Hip Exercises: Standing   Heel Raises Both;1 set;5 reps   Heel Raises Limitations alternating with toe raises (added)   Knee Flexion Strengthening;Both;1 set;5 reps   Knee Flexion Limitations hold 5 sec    Hip Flexion Stengthening;Both;1 set;5 reps   Hip Abduction Stengthening;Both;1 set;20 reps   Abduction Limitations added alternating    Hip Extension Right;1 set;5 reps;AAROM  with assist to keep LE straight--did not try on the LLE due    Extension Limitations did not add to HEP due to back pain   Functional Squat 1 set;10 reps   Functional Squat Limitations max cues as pt stays upright with knees beyond toes; after hands-on faciliation, ,she reverted to her method    SLS 1 rep >10 sec     Knee/Hip Exercises: Seated   Long Arc Quad AROM;Both;5 reps  limited due to pain     Knee/Hip Exercises: Supine   Bridges Strengthening;Both;1 set;5 reps  hold 2 sec   Other Supine Knee/Hip Exercises hooklying hip abdct                PT Education - 09/13/16 1846    Education provided Yes   Education Details plan is to review her current HEP (asked to bring to next visit) and update as needed (pt reports some exercises have gotten easy)   Person(s) Educated Patient   Methods Explanation   Comprehension Verbalized understanding             PT Long Term Goals - 09/08/16 1823      PT LONG TERM GOAL #1   Title Pt will be independent with  HEP for improved balance, strength and gait.   Time 4   Period Weeks   Status New   Target Date 10/07/16     PT LONG TERM GOAL #2   Title Pt will improve TUG socre to less than or equal to 18 seconds for decreased fall risk.   Time 4   Period Weeks   Status New   Target Date 10/07/16     PT LONG TERM GOAL #3   Title Pt will improve DGI score to at least 18/24 for decreased fall risk.   Time 4   Period Weeks   Status New  Target Date 10/07/16     PT LONG TERM GOAL #4   Title Pt will ambulate at least 500 ft, indoor and outdoor surfaces, independently, with no LOB for improved gait safety and independence.   Time 4   Period Weeks   Status New   Target Date 10/07/16     PT LONG TERM GOAL #5   Title Pt will verbalize understanding of fall prevention in the home environment.   Time 4   Period Weeks   Status New   Target Date 10/07/16               Plan - 09/13/16 1848    Clinical Impression Statement Session focused on reviewing pt's current HHPT HEP (what she could remember as she did not have the sheets with her). Several updates made to increase the difficulty for both strengthening and balance training. Patient is progressing towards goals.    Rehab Potential Good   PT Frequency 2x / week   PT Duration 4 weeks   PT Treatment/Interventions ADLs/Self Care Home Management;Gait training;DME Instruction;Functional mobility training;Therapeutic activities;Therapeutic exercise;Balance training;Neuromuscular re-education;Patient/family education   PT Next Visit Plan Review HHPT HEP (if she brings) and continue to update; address static and dynamic balance and gait training   Consulted and Agree with Plan of Care Patient      Patient will benefit from skilled therapeutic intervention in order to improve the following deficits and impairments:  Abnormal gait, Decreased balance, Decreased mobility, Decreased strength, Difficulty walking, Postural dysfunction  Visit  Diagnosis: Other abnormalities of gait and mobility  Unsteadiness on feet  Abnormal posture     Problem List Patient Active Problem List   Diagnosis Date Noted  . Tachy-brady syndrome ([place]) 06/23/2016  . Closed subcapital fracture of left femur ([place]) 06/23/2016  . Orthostatic hypotension due to Parkinson's disease ([place]) 06/23/2016  . Parkinson disease ([place]) 04/21/2016  . Hypokalemia 04/21/2016  . Normal coronary arteries 2001 05/08/2014  . Pacemaker 08/10/2012  . Aortic insufficiency 08/10/2012  . Constipation 12/27/2011  . Family hx of colon cancer 12/27/2011  . HTN (hypertension) 07/06/2011  . Hypothyroidism 07/05/2011  . Paroxysmal atrial fibrillation ([place]) 07/05/2011  . Sinus pause, 7 seconds post conversion, S/P MDT pacemaker 07/05/11 07/05/2011  . Dyslipidemia 07/05/2011  . Angina at rest, with rapid AF 07/05/2011    Rexanne Mano, PT 09/13/2016, 6:51 PM  [facility] [address] [place], [place], [address] Phone: [phone]   Fax:  [phone]  Name: Abigail Taylor MRN: [id number] Date of Birth: 04-Jan-1932

## 2016-09-14 ENCOUNTER — Ambulatory Visit: Payer: Medicare Other | Admitting: Physical Therapy

## 2016-09-14 DIAGNOSIS — R2681 Unsteadiness on feet: Secondary | ICD-10-CM

## 2016-09-14 DIAGNOSIS — R293 Abnormal posture: Secondary | ICD-10-CM | POA: Diagnosis not present

## 2016-09-14 DIAGNOSIS — R2689 Other abnormalities of gait and mobility: Secondary | ICD-10-CM

## 2016-09-14 NOTE — Patient Instructions (Signed)
Provided patient with picture for wide BOS lateral weightshifting x 10 reps

## 2016-09-14 NOTE — Therapy (Signed)
[facility] [address] [place], [place], [address] Phone: [phone]   Fax:  [phone]  Physical Therapy Treatment  Patient Details  Name: Abigail Taylor MRN: [id number] Date of Birth: 1931/10/10 Referring Provider: Dr. Marcelino Scot  Encounter Date: 09/14/2016      PT End of Session - 09/14/16 2216    Visit Number 3   Number of Visits 9   Date for PT Re-Evaluation 11/07/16   Authorization Type Medicare Primary, Sent[place] secondary-GCODE every 10th visit   PT Start Time 1104   PT Stop Time 1145   PT Time Calculation (min) 41 min   Activity Tolerance Patient tolerated treatment well   Behavior During Therapy [facility] for tasks assessed/performed      Past Medical History:  Diagnosis Date  . Angina at rest, with the tachycardia 07/05/2011  . Aortic insufficiency 07/05/2011  . Atrial fibrillation ([place])   . Brady-tachy syndrome ([place])    Dual chamber Medtronic pacemaker Adapta  . Chronic back pain   . Constipation 12/27/2011  . Gait instability   . Hyperlipidemia   . Hypothyroidism   . Normal cardiac stress test 2013  . OA (osteoarthritis)   . Pacemaker   . Parkinson's disease ([place])   . Tremor     Past Surgical History:  Procedure Laterality Date  . ABDOMINAL HYSTERECTOMY    . CARDIAC CATHETERIZATION  05/26/1999   normal  . CARDIAC CATHETERIZATION  2001   normal coronary arteries  . COLONOSCOPY N/A 11/15/2012   Procedure: COLONOSCOPY;  Surgeon: [name], MD;  [facility];  Service: Endoscopy;  Laterality: N/A;  1030  . HIP ARTHROPLASTY Left 06/24/2016   Procedure: ARTHROPLASTY BIPOLAR HIP (HEMIARTHROPLASTY);  Surgeon: Altamese [place], MD;  Location: [facility];  Service: Orthopedics;  Laterality: Left;  . INSERT / REPLACE / REMOVE PACEMAKER    . NM MYOVIEW LTD  07/19/2011   normal  . PERMANENT PACEMAKER INSERTION  07/05/2011   Medtronic Adapta dual chamber  . PERMANENT PACEMAKER INSERTION N/A 07/05/2011    Procedure: PERMANENT PACEMAKER INSERTION;  Surgeon: [name], MD;  Location: [facility] [place] CATH LAB;  Service: Cardiovascular;  Laterality: N/A;    There were no vitals filed for this visit.      Subjective Assessment - 09/14/16 1108    Subjective Back was bothering me over the weekend, but it's better today.     Patient Stated Goals Pt's goal for therapy is to improve balance and walking (upon recommendation of HHPT).   Currently in Pain? Yes   Pain Score 3    Pain Location Back   Pain Orientation Left;Lower   Pain Descriptors / Indicators Aching   Pain Type Chronic pain   Pain Onset More than a month ago   Aggravating Factors  certain movements   Pain Relieving Factors resting, taking it easy   Pain Onset More than a month ago                         [facility] Adult PT Treatment/Exercise - 09/14/16 0001      Ambulation/Gait   Ambulation/Gait Yes   Ambulation/Gait Assistance 5: Supervision   Ambulation Distance (Feet) 230 Feet  then 120   Assistive device None  used bilateral walking poles to facilitate arm swing   Gait Pattern Step-through pattern;Decreased arm swing - right;Decreased arm swing - left;Decreased step length - right;Decreased step length - left;Decreased trunk rotation;Trunk flexed;Narrow base of support  Ambulation Surface Level;Indoor   Gait Comments Following use of bilateral walking poles, pt ambulates without poles, with noted relaxed, more natural reciprocal arm swing.     High Level Balance   High Level Balance Comments Step and weigthshift to side, 10 reps alternating, then forward 10 reps alternating each side; then forward/back step and weightshift x 10 reps; stagger stance weigthshifting forward and back x 10 reps; lateral weightshifting x 10 reps at counter with UE support.  Pt needs extra times and cues for weightshfiting activities, and reports that back pain is 5/10 at end of PT session.     Exercises   Other Exercises  Reviewed HEP  given yesterday:  alternating hip kicks 10 reps to side; alternating hip kicks 10 reps forward, then heel/toe raises.  Cues provided with hip kicks to widen BOS between each kick.                PT Education - 09/14/16 2215    Education provided Yes   Education Details added to HEP lateral weightshifting   Person(s) Educated Patient   Methods Explanation;Demonstration;Handout   Comprehension Verbalized understanding;Returned demonstration             PT Long Term Goals - 09/08/16 1823      PT LONG TERM GOAL #1   Title Pt will be independent with HEP for improved balance, strength and gait.   Time 4   Period Weeks   Status New   Target Date 10/07/16     PT LONG TERM GOAL #2   Title Pt will improve TUG socre to less than or equal to 18 seconds for decreased fall risk.   Time 4   Period Weeks   Status New   Target Date 10/07/16     PT LONG TERM GOAL #3   Title Pt will improve DGI score to at least 18/24 for decreased fall risk.   Time 4   Period Weeks   Status New   Target Date 10/07/16     PT LONG TERM GOAL #4   Title Pt will ambulate at least 500 ft, indoor and outdoor surfaces, independently, with no LOB for improved gait safety and independence.   Time 4   Period Weeks   Status New   Target Date 10/07/16     PT LONG TERM GOAL #5   Title Pt will verbalize understanding of fall prevention in the home environment.   Time 4   Period Weeks   Status New   Target Date 10/07/16               Plan - 09/14/16 2216    Clinical Impression Statement Focused mostly on standing exercises today, as pt requested she wants to work mainly on balance.  She did bring in her exercise packet, and reports leg exercises lying down are easy.  Focused on standing balance activities per patient request.  Pt has difficulty with sequence of stagger stance forward/back weigthshifting exercises, and tends to have narrow BOS with standing exercises.  Reports increase in back  pain during session, but when cued for upright posture, pt has decreased c/o back pain.  Will continue to benefit from skilled PT to address goals for improved balance and functional mobility.   Rehab Potential Good   PT Frequency 2x / week   PT Duration 4 weeks   PT Treatment/Interventions ADLs/Self Care Home Management;Gait training;DME Instruction;Functional mobility training;Therapeutic activities;Therapeutic exercise;Balance training;Neuromuscular re-education;Patient/family education   PT Next Visit Plan  Continue to work on balance exercises, weightshifting, focus on widened BOS; gait training   Consulted and Agree with Plan of Care Patient      Patient will benefit from skilled therapeutic intervention in order to improve the following deficits and impairments:  Abnormal gait, Decreased balance, Decreased mobility, Decreased strength, Difficulty walking, Postural dysfunction  Visit Diagnosis: Unsteadiness on feet  Abnormal posture  Other abnormalities of gait and mobility     Problem List Patient Active Problem List   Diagnosis Date Noted  . Tachy-brady syndrome ([place]) 06/23/2016  . Closed subcapital fracture of left femur ([place]) 06/23/2016  . Orthostatic hypotension due to Parkinson's disease ([place]) 06/23/2016  . Parkinson disease ([place]) 04/21/2016  . Hypokalemia 04/21/2016  . Normal coronary arteries 2001 05/08/2014  . Pacemaker 08/10/2012  . Aortic insufficiency 08/10/2012  . Constipation 12/27/2011  . Family hx of colon cancer 12/27/2011  . HTN (hypertension) 07/06/2011  . Hypothyroidism 07/05/2011  . Paroxysmal atrial fibrillation ([place]) 07/05/2011  . Sinus pause, 7 seconds post conversion, S/P MDT pacemaker 07/05/11 07/05/2011  . Dyslipidemia 07/05/2011  . Angina at rest, with rapid AF 07/05/2011    Tavaughn Silguero W. 09/14/2016, 10:21 PM  Frazier Butt., PT   [facility] [address] [place],  [place], [address] Phone: [phone]   Fax:  [phone]  Name: Abigail Taylor MRN: [id number] Date of Birth: 10/08/1931

## 2016-09-21 DIAGNOSIS — S72012D Unspecified intracapsular fracture of left femur, subsequent encounter for closed fracture with routine healing: Secondary | ICD-10-CM | POA: Diagnosis not present

## 2016-09-22 ENCOUNTER — Ambulatory Visit: Payer: Medicare Other | Attending: Orthopedic Surgery | Admitting: Physical Therapy

## 2016-09-22 DIAGNOSIS — C44311 Basal cell carcinoma of skin of nose: Secondary | ICD-10-CM | POA: Diagnosis not present

## 2016-09-22 DIAGNOSIS — R2689 Other abnormalities of gait and mobility: Secondary | ICD-10-CM | POA: Diagnosis not present

## 2016-09-22 DIAGNOSIS — D225 Melanocytic nevi of trunk: Secondary | ICD-10-CM | POA: Diagnosis not present

## 2016-09-22 DIAGNOSIS — R2681 Unsteadiness on feet: Secondary | ICD-10-CM | POA: Insufficient documentation

## 2016-09-22 NOTE — Patient Instructions (Addendum)
STANDING AT THE COUNTER:  Single Step: Side    Lifting foot off floor, take one step slowly to left side. Return to starting position. Repeat __10__ times per session. Do _1-2___ sessions per day. Repeat to opposite side.  Copyright  VHI. All rights reserved.  Single Step: Forward / Backward    Lifting foot off floor, take one step slowly forward with right leg. Return to starting position. Take one step backward and return. Repeat _10___ times per session. Do __1-2__ sessions per day. Repeat with other leg.  Copyright  VHI. All rights reserved.  Feet Partial Heel-Toe, Head Motion - Eyes Open    With eyes open, right foot partially in front of the other, move head slowly: up and down. Repeat _5-10__ times per session.  Then move head slowly side to side.  Do __1-2__ sessions per day.  Copyright  VHI. All rights reserved.

## 2016-09-23 NOTE — Therapy (Signed)
[facility] [address] [place], [place], [address] Phone: [phone]   Fax:  [phone]  Physical Therapy Treatment  Patient Details  Name: Abigail Taylor MRN: [id number] Date of Birth: 04/22/1931 Referring Provider: Dr. Marcelino Scot  Encounter Date: 09/22/2016      PT End of Session - 09/23/16 1309    Visit Number 4   Number of Visits 9   Date for PT Re-Evaluation 11/07/16   Authorization Type Medicare Primary, Sentinel secondary-GCODE every 10th visit   PT Start Time 0934   PT Stop Time 1016   PT Time Calculation (min) 42 min   Activity Tolerance Patient tolerated treatment well   Behavior During Therapy [facility] for tasks assessed/performed      Past Medical History:  Diagnosis Date  . Angina at rest, with the tachycardia 07/05/2011  . Aortic insufficiency 07/05/2011  . Atrial fibrillation ([place])   . Brady-tachy syndrome ([place])    Dual chamber Medtronic pacemaker Adapta  . Chronic back pain   . Constipation 12/27/2011  . Gait instability   . Hyperlipidemia   . Hypothyroidism   . Normal cardiac stress test 2013  . OA (osteoarthritis)   . Pacemaker   . Parkinson's disease ([place])   . Tremor     Past Surgical History:  Procedure Laterality Date  . ABDOMINAL HYSTERECTOMY    . CARDIAC CATHETERIZATION  05/26/1999   normal  . CARDIAC CATHETERIZATION  2001   normal coronary arteries  . COLONOSCOPY N/A 11/15/2012   Procedure: COLONOSCOPY;  Surgeon: [name], MD;  [facility];  Service: Endoscopy;  Laterality: N/A;  1030  . HIP ARTHROPLASTY Left 06/24/2016   Procedure: ARTHROPLASTY BIPOLAR HIP (HEMIARTHROPLASTY);  Surgeon: Altamese [place], MD;  Location: [facility];  Service: Orthopedics;  Laterality: Left;  . INSERT / REPLACE / REMOVE PACEMAKER    . NM MYOVIEW LTD  07/19/2011   normal  . PERMANENT PACEMAKER INSERTION  07/05/2011   Medtronic Adapta dual chamber  . PERMANENT PACEMAKER INSERTION N/A 07/05/2011    Procedure: PERMANENT PACEMAKER INSERTION;  Surgeon: [name], MD;  Location: [facility] CATH LAB;  Service: Cardiovascular;  Laterality: N/A;    There were no vitals filed for this visit.      Subjective Assessment - 09/22/16 0936    Subjective Went to see doctor as follow-up for my hip yesterday, and he basically still wants me to abide by my precautions.  He was pleased with my walking.   Patient Stated Goals Pt's goal for therapy is to improve balance and walking (upon recommendation of HHPT).   Currently in Pain? Yes   Pain Score 2    Pain Location Back   Pain Orientation Left;Lower   Pain Descriptors / Indicators Aching   Pain Type Chronic pain   Pain Onset More than a month ago   Aggravating Factors  bending and getting up out of a chair   Pain Relieving Factors resting, walking   Pain Onset More than a month ago                         [facility] Adult PT Treatment/Exercise - 09/23/16 1305      Ambulation/Gait   Ambulation/Gait Yes   Ambulation/Gait Assistance 5: Supervision   Ambulation/Gait Assistance Details Gait with environmental scanning along hallway and in gym area.  Pt noted to slow gait with head turns and head nods.   Ambulation Distance (Feet) 200 Feet  x 3; then 30 ft x 6 reps along hallway   Assistive device None   Gait Pattern Step-through pattern;Decreased arm swing - right;Decreased arm swing - left;Decreased step length - right;Decreased step length - left;Decreased trunk rotation;Trunk flexed;Narrow base of support   Ambulation Surface Level;Indoor     High Level Balance   High Level Balance Comments Lateral weightshifting at counter, with widened BOS, 2 sets x 10 reps, with second set reaching to cabinet; then step and weightshift to side x 10 reps, forward step and weightshift x 10 reps, then forward/back step and weightshift x 10 reps each leg with 1-2 UE support      Standing heel/toe raises x 20 reps with UE support at counter        Balance Exercises - 09/23/16 1305      Balance Exercises: Standing   Standing Eyes Opened Wide (BOA);Head turns;Solid surface;5 reps  Head nods   Partial Tandem Stance Eyes open;Upper extremity support 2;5 reps  Head turns, head nods at sink      After gait:  Pt gets into supine position on mat, into 90 degree hip/knee position>straightening L knee for nerve glide to address pt's c/o L sciatica after gait (with 5/10 pain, then no c/o pain after 2 x 30 seconds of nerve glide)      PT Education - 09/23/16 1308    Education provided Yes   Education Details HEP additions-see instructions; added UE lifting to target for lateral weightshifting   Person(s) Educated Patient   Methods Explanation;Demonstration;Handout   Comprehension Verbalized understanding;Returned demonstration             PT Long Term Goals - 09/08/16 1823      PT LONG TERM GOAL #1   Title Pt will be independent with HEP for improved balance, strength and gait.   Time 4   Period Weeks   Status New   Target Date 10/07/16     PT LONG TERM GOAL #2   Title Pt will improve TUG socre to less than or equal to 18 seconds for decreased fall risk.   Time 4   Period Weeks   Status New   Target Date 10/07/16     PT LONG TERM GOAL #3   Title Pt will improve DGI score to at least 18/24 for decreased fall risk.   Time 4   Period Weeks   Status New   Target Date 10/07/16     PT LONG TERM GOAL #4   Title Pt will ambulate at least 500 ft, indoor and outdoor surfaces, independently, with no LOB for improved gait safety and independence.   Time 4   Period Weeks   Status New   Target Date 10/07/16     PT LONG TERM GOAL #5   Title Pt will verbalize understanding of fall prevention in the home environment.   Time 4   Period Weeks   Status New   Target Date 10/07/16               Plan - 09/23/16 1311    Clinical Impression Statement Focused treatment session on standing balance, weightshifting  activities, and dynamic gait.  Pt demonstrates improved ability for weightshifting and widened BOS during activities today.  No c/o increased back pain with standing balance activities, but pt does report increased sciatic type pain down LLE after gait.  (Resolved with 2 reps of "nerve gliding" SLR/hamstring stretch).  Will continue to address balance and functional mobility.  Rehab Potential Good   PT Frequency 2x / week   PT Duration 4 weeks   PT Treatment/Interventions ADLs/Self Care Home Management;Gait training;DME Instruction;Functional mobility training;Therapeutic activities;Therapeutic exercise;Balance training;Neuromuscular re-education;Patient/family education   PT Next Visit Plan Review HEP; work on weightshifting, gait training for arm swing and step length; discuss transition to [employer] versus d/c next week   Consulted and Agree with Plan of Care Patient      Patient will benefit from skilled therapeutic intervention in order to improve the following deficits and impairments:  Abnormal gait, Decreased balance, Decreased mobility, Decreased strength, Difficulty walking, Postural dysfunction  Visit Diagnosis: Unsteadiness on feet  Other abnormalities of gait and mobility     Problem List Patient Active Problem List   Diagnosis Date Noted  . Tachy-brady syndrome ([place]) 06/23/2016  . Closed subcapital fracture of left femur ([place]) 06/23/2016  . Orthostatic hypotension due to Parkinson's disease ([place]) 06/23/2016  . Parkinson disease ([place]) 04/21/2016  . Hypokalemia 04/21/2016  . Normal coronary arteries 2001 05/08/2014  . Pacemaker 08/10/2012  . Aortic insufficiency 08/10/2012  . Constipation 12/27/2011  . Family hx of colon cancer 12/27/2011  . HTN (hypertension) 07/06/2011  . Hypothyroidism 07/05/2011  . Paroxysmal atrial fibrillation ([place]) 07/05/2011  . Sinus pause, 7 seconds post conversion, S/P MDT pacemaker 07/05/11 07/05/2011  . Dyslipidemia 07/05/2011  . Angina  at rest, with rapid AF 07/05/2011    Nimco Bivens W. 09/23/2016, 1:16 PM  Frazier Butt., PT   [facility] [address] [place], [place], [address] Phone: [phone]   Fax:  [phone]  Name: Abigail Taylor MRN: [id number] Date of Birth: Oct 03, 1931

## 2016-09-28 ENCOUNTER — Ambulatory Visit: Payer: Medicare Other | Attending: Orthopedic Surgery | Admitting: Physical Therapy

## 2016-09-28 DIAGNOSIS — R293 Abnormal posture: Secondary | ICD-10-CM | POA: Diagnosis not present

## 2016-09-28 DIAGNOSIS — R2689 Other abnormalities of gait and mobility: Secondary | ICD-10-CM | POA: Insufficient documentation

## 2016-09-28 DIAGNOSIS — R2681 Unsteadiness on feet: Secondary | ICD-10-CM | POA: Insufficient documentation

## 2016-09-28 NOTE — Therapy (Signed)
[facility] [address] [place], [place], [address] Phone: [phone]   Fax:  [phone]  Physical Therapy Treatment  Patient Details  Name: MONETTE OMARA MRN: [id number] Date of Birth: [birth year] Referring Provider: Dr. Marcelino Scot  Encounter Date: 09/28/2016      PT End of Session - 09/28/16 1230    Visit Number 5   Number of Visits 9   Date for PT Re-Evaluation 11/07/16   Authorization Type Medicare Primary, Sentinel secondary-GCODE every 10th visit   PT Start Time 0847   PT Stop Time 0930   PT Time Calculation (min) 43 min   Activity Tolerance Patient tolerated treatment well   Behavior During Therapy [facility] for tasks assessed/performed      Past Medical History:  Diagnosis Date  . Angina at rest, with the tachycardia 07/05/2011  . Aortic insufficiency 07/05/2011  . Atrial fibrillation ([place])   . Brady-tachy syndrome ([place])    Dual chamber Medtronic pacemaker Adapta  . Chronic back pain   . Constipation 12/27/2011  . Gait instability   . Hyperlipidemia   . Hypothyroidism   . Normal cardiac stress test 2013  . OA (osteoarthritis)   . Pacemaker   . Parkinson's disease ([place])   . Tremor     Past Surgical History:  Procedure Laterality Date  . ABDOMINAL HYSTERECTOMY    . CARDIAC CATHETERIZATION  05/26/1999   normal  . CARDIAC CATHETERIZATION  2001   normal coronary arteries  . COLONOSCOPY N/A 11/15/2012   Procedure: COLONOSCOPY;  Surgeon: [name], MD;  [facility];  Service: Endoscopy;  Laterality: N/A;  1030  . HIP ARTHROPLASTY Left 06/24/2016   Procedure: ARTHROPLASTY BIPOLAR HIP (HEMIARTHROPLASTY);  Surgeon: Altamese [place]ak[place], MD;  Location: [facility];  Service: Orthopedics;  Laterality: Left;  . INSERT / REPLACE / REMOVE PACEMAKER    . NM MYOVIEW LTD  07/19/2011   normal  . PERMANENT PACEMAKER INSERTION  07/05/2011   Medtronic Adapta dual chamber  . PERMANENT PACEMAKER INSERTION N/A 07/05/2011    Procedure: PERMANENT PACEMAKER INSERTION;  Surgeon: [name], MD;  Location: [facility] CATH LAB;  Service: Cardiovascular;  Laterality: N/A;    There were no vitals filed for this visit.      Subjective Assessment - 09/28/16 0849    Subjective My back is bothering me some-the same pain I've had for years.   Patient Stated Goals Pt's goal for therapy is to improve balance and walking (upon recommendation of HHPT).   Currently in Pain? Yes   Pain Score 3    Pain Location Back   Pain Orientation Right;Left;Lower   Pain Descriptors / Indicators Aching   Pain Type Chronic pain   Pain Radiating Towards L leg   Pain Onset More than a month ago   Pain Frequency Intermittent   Aggravating Factors  bending and gettin gup out of a chair   Pain Relieving Factors nerve gliding exercise from last visit helped   Pain Onset More than a month ago                         [facility] Adult PT Treatment/Exercise - 09/28/16 0851      Transfers   Transfers Sit to Stand;Stand to Sit   Sit to Stand 6: Modified independent (Device/Increase time);With upper extremity assist;Without upper extremity assist;From bed;From chair/3-in-1   Stand to Sit 6: Modified independent (Device/Increase time);With upper extremity assist;Without upper extremity assist;To bed;To  chair/3-in-1   Number of Reps --  3 sets of 5 reps-20", 18", 16"soft surface   Transfer Cueing Cues provided for proper technique:  scooting to edge of mat, use of hand to boost to push up to upright standing   Comments Pt reports difficulty with transfers; PT educated patient on need for proper technique and larger amplitude movement patterns to improve ease of transfers.     High Level Balance   High Level Balance Comments Review of HEP from last visit:  forward step and weightshift x 10, forward/back step and weightshift x 10 each leg, then standing stagger stance position at counter with head turns x 5, then head nods x 5.  Pt  performs HEP without diffiulty.  Cues provided for wide BOS, upright posture, and increased intensity of stepping     Self-Care   Self-Care Other Self-Care Comments   Other Self-Care Comments  Discussed Parkinson's related deficits affecting overall mobility:  transfers, gait, pt reported decrease in participation in household and garden/household activities, as well as pt's reported lower voice volume.  Discussed optimal Parkinson's disease management, including benefits of seeing a Movement Disorders Specialist, speech therapist, and OT/PT specifically for Parkinson's related issues.  Pt voices overall frustration at amount of therapies in the past several months and just not sure whether to continue PT or not.  Provided information on Parkinson's support groups in [place] and in [facility]     Exercises   Exercises Lumbar     Lumbar Exercises: Stretches   Active Hamstring Stretch 3 reps  15 seconds    Active Hamstring Stretch Limitations "nerve glide" on LLE       Educated in supine nerve glide exercise above, as a means to manage pt's longstanding sciatic-type pain, which increases at times through the session.  It is relieved by this exercise.  Cautioned patient to not go above 90 degrees hip flexion to keep hip precautions.          PT Education - 09/28/16 1229    Education provided Yes   Education Details nerve gliding stretch in supine (Hamstring stretch); sit<>stand technique, Parkinson's support groups, Movement Disorders Specialist information   Person(s) Educated Patient   Methods Demonstration;Explanation;Handout   Comprehension Verbalized understanding;Returned demonstration             PT Long Term Goals - 09/08/16 1823      PT LONG TERM GOAL #1   Title Pt will be independent with HEP for improved balance, strength and gait.   Time 4   Period Weeks   Status New   Target Date 10/07/16     PT LONG TERM GOAL #2   Title Pt will improve TUG socre to less  than or equal to 18 seconds for decreased fall risk.   Time 4   Period Weeks   Status New   Target Date 10/07/16     PT LONG TERM GOAL #3   Title Pt will improve DGI score to at least 18/24 for decreased fall risk.   Time 4   Period Weeks   Status New   Target Date 10/07/16     PT LONG TERM GOAL #4   Title Pt will ambulate at least 500 ft, indoor and outdoor surfaces, independently, with no LOB for improved gait safety and independence.   Time 4   Period Weeks   Status New   Target Date 10/07/16     PT LONG TERM GOAL #5   Title Pt will  verbalize understanding of fall prevention in the home environment.   Time 4   Period Weeks   Status New   Target Date 10/07/16               Plan - 09/28/16 1231    Clinical Impression Statement Focused treatment session today on transfer technique practice (per patient's request), review of balance HEP, and education regarding optimal Parkinson's disease management.  Pt appears frustrated with overall decrease in functional mobility, increase in Parkinson's disease symptoms including decreased voice volume, and being tired of going to so many therapy appointments (with these appointments far from her home).  PT has mentioned [facility] as an option to transfer for additional therapy, as PT would recommend pt continuing therapy, but pt will decide and let therapist know tomorrow.   Rehab Potential Good   PT Frequency 2x / week   PT Duration 4 weeks   PT Treatment/Interventions ADLs/Self Care Home Management;Gait training;DME Instruction;Functional mobility training;Therapeutic activities;Therapeutic exercise;Balance training;Neuromuscular re-education;Patient/family education   PT Next Visit Plan Discuss continuing POC versus d/c; check goals if needed; review transfer technique   Consulted and Agree with Plan of Care Patient      Patient will benefit from skilled therapeutic intervention in order to improve the following deficits and  impairments:  Abnormal gait, Decreased balance, Decreased mobility, Decreased strength, Difficulty walking, Postural dysfunction  Visit Diagnosis: Unsteadiness on feet  Abnormal posture  Other abnormalities of gait and mobility     Problem List Patient Active Problem List   Diagnosis Date Noted  . Tachy-brady syndrome ([place]) 06/23/2016  . Closed subcapital fracture of left femur ([place]) 06/23/2016  . Orthostatic hypotension due to Parkinson's disease ([place]) 06/23/2016  . Parkinson disease ([place]) 04/21/2016  . Hypokalemia 04/21/2016  . Normal coronary arteries 2001 05/08/2014  . Pacemaker 08/10/2012  . Aortic insufficiency 08/10/2012  . Constipation 12/27/2011  . Family hx of colon cancer 12/27/2011  . HTN (hypertension) 07/06/2011  . Hypothyroidism 07/05/2011  . Paroxysmal atrial fibrillation ([place]) 07/05/2011  . Sinus pause, 7 seconds post conversion, S/P MDT pacemaker 07/05/11 07/05/2011  . Dyslipidemia 07/05/2011  . Angina at rest, with rapid AF 07/05/2011    Jeremaih Klima W. 09/28/2016, 12:36 PM Frazier Butt., PT  [facility] [address] [place], [place], [address] Phone: [phone]   Fax:  [phone]  Name: KYLYNN STRE[address] MRN: [id number] Date of Birth: Sep 22, 1931

## 2016-09-28 NOTE — Patient Instructions (Addendum)
Stretching: Hamstring (Supine)    Supporting right thigh behind knee, slowly straighten knee until stretch is felt in back of thigh. Hold ____ seconds. Repeat ____ times per set. Do ____ sets per session. Do ____ sessions per day.  http://orth.exer.us/657   Copyright  VHI. All rights reserved.  Sit to Stand Transfers:  1. Scoot out to the edge of the chair 2. Place your feet flat on the floor, shoulder width apart.  Make sure your feet are tucked just under your knees. 3. Lean forward (nose over toes) with momentum, and stand up tall with your best posture.  If you need to use your arms, use them as a quick boost up to stand. 4. If you are in a low or soft chair, you can lean back and then forward up to stand, in order to get more momentum. 5. Once you are standing, make sure you are looking ahead and standing tall.  To sit down:  1. Back up until you feel the chair behind your legs. 2. Bend at you hips, reaching  Back for you chair, if needed, then slowly squat to sit down on your chair.    A Movement Disorders Specialist is a neurologist who specializes in Parkinson's disease, who has had additional training specifically in Parkinson's disease.  A local Movement Disorders Specialist here in [place] in [place], with [facility].

## 2016-09-29 ENCOUNTER — Ambulatory Visit: Payer: Medicare Other | Admitting: Physical Therapy

## 2016-09-29 DIAGNOSIS — R2681 Unsteadiness on feet: Secondary | ICD-10-CM

## 2016-09-29 DIAGNOSIS — R2689 Other abnormalities of gait and mobility: Secondary | ICD-10-CM

## 2016-09-29 DIAGNOSIS — R293 Abnormal posture: Secondary | ICD-10-CM | POA: Diagnosis not present

## 2016-09-29 NOTE — Patient Instructions (Signed)

## 2016-09-30 NOTE — Therapy (Signed)
[facility] [address] [place], [place], [address] Phone: [phone]   Fax:  [phone]  Physical Therapy Treatment  Patient Details  Name: Abigail Taylor MRN: [id number] Date of Birth: Jun 23, 1931 Referring Provider: Dr. Marcelino Scot  Encounter Date: 09/29/2016      PT End of Session - 09/30/16 0941    Visit Number 6   Number of Visits 9   Date for PT Re-Evaluation 11/07/16   Authorization Type Medicare Primary, Sent[place] secondary-GCODE every 10th visit   PT Start Time 0845   PT Stop Time 0929   PT Time Calculation (min) 44 min   Activity Tolerance Patient tolerated treatment well   Behavior During Therapy [facility] for tasks assessed/performed      Past Medical History:  Diagnosis Date  . Angina at rest, with the tachycardia 07/05/2011  . Aortic insufficiency 07/05/2011  . Atrial fibrillation ([place])   . Brady-tachy syndrome ([place])    Dual chamber Medtronic pacemaker Adapta  . Chronic back pain   . Constipation 12/27/2011  . Gait instability   . Hyperlipidemia   . Hypothyroidism   . Normal cardiac stress test 2013  . OA (osteoarthritis)   . Pacemaker   . Parkinson's disease ([place])   . Tremor     Past Surgical History:  Procedure Laterality Date  . ABDOMINAL HYSTERECTOMY    . CARDIAC CATHETERIZATION  05/26/1999   normal  . CARDIAC CATHETERIZATION  2001   normal coronary arteries  . COLONOSCOPY N/A 11/15/2012   Procedure: COLONOSCOPY;  Surgeon: [name], MD;  [facility];  Service: Endoscopy;  Laterality: N/A;  1030  . HIP ARTHROPLASTY Left 06/24/2016   Procedure: ARTHROPLASTY BIPOLAR HIP (HEMIARTHROPLASTY);  Surgeon: Altamese Cornfields, MD;  Location: [facility];  Service: Orthopedics;  Laterality: Left;  . INSERT / REPLACE / REMOVE PACEMAKER    . NM MYOVIEW LTD  07/19/2011   normal  . PERMANENT PACEMAKER INSERTION  07/05/2011   Medtronic Adapta dual chamber  . PERMANENT PACEMAKER INSERTION N/A 07/05/2011    Procedure: PERMANENT PACEMAKER INSERTION;  Surgeon: [name], MD;  Location: [facility];  Service: Cardiovascular;  Laterality: N/A;    There were no vitals filed for this visit.      Subjective Assessment - 09/29/16 0847    Subjective Sometimes I just don't feel good.  I haven't done anything this morning so far.  Did practice the sit to stand activities yesterday.   Patient Stated Goals Pt's goal for therapy is to improve balance and walking (upon recommendation of HHPT).   Currently in Pain? Yes   Pain Score 3    Pain Location Back   Pain Orientation Right;Left;Lower   Pain Type Chronic pain   Pain Onset More than a month ago   Pain Frequency Intermittent   Aggravating Factors  bending and getting up out of chair, walking too long   Pain Relieving Factors nerve gliding exercies, sitting   Pain Onset More than a month ago                         [facility] Adult PT Treatment/Exercise - 09/30/16 0935      Transfers   Transfers Sit to Stand;Stand to Sit   Sit to Stand 6: Modified independent (Device/Increase time);With upper extremity assist;Without upper extremity assist;From bed;From chair/3-in-1   Stand to Sit 6: Modified independent (Device/Increase time);With upper extremity assist;Without upper extremity assist;To bed;To chair/3-in-1  Number of Reps 10 reps  throughout session   Transfer Cueing Pt return demo understanding of sit<>stand technique with minimal cues     Ambulation/Gait   Ambulation/Gait Yes   Ambulation/Gait Assistance 5: Supervision   Ambulation Distance (Feet) 400 Feet  100 ft x 2   Assistive device None  trial of SPC   Gait Pattern Step-through pattern;Decreased arm swing - right;Decreased arm swing - left;Decreased step length - right;Decreased step length - left;Decreased trunk rotation;Trunk flexed;Narrow base of support   Ambulation Surface Level;Indoor   Gait Comments Trialed SPC with gait x 100 ft, with pt noted to have  decreased gait pace, narrowed cane placement, and narrowed BOS/near scissoring gait pattern.  Cane does not appear to be option for additional assistance with gait at this time (tried to see if cane would be helpful for longer distance gait)     Standardized Balance Assessment   Standardized Balance Assessment Dynamic Gait Index;Timed Up and Go Test     Dynamic Gait Index   Level Surface Moderate Impairment   Change in Gait Speed Mild Impairment   Gait with Horizontal Head Turns Moderate Impairment   Gait with Vertical Head Turns Moderate Impairment   Gait and Pivot Turn Mild Impairment   Step Over Obstacle Moderate Impairment   Step Around Obstacles Mild Impairment   Steps Mild Impairment   Total Score 12     Timed Up and Go Test   TUG Normal TUG   Normal TUG (seconds) 18.75  18.22, 16.88 (avg = 17.95 sec)     Self-Care   Self-Care Other Self-Care Comments   Other Self-Care Comments  Discussed fall prevention within home environment.  Discussed POC, and pt does not object to finishing PT today.  PT strongly recommends pt follow up with her neurologist or local movement disorders specialist, to try to assist with pt's Parkinson's related deficits (which seem to be impacting daily activities and progress with PT)-namely fatigue/feeling of "just being tired", increased slowness and stiffness with movement patterns, and pt's reported limited walking due to back pain.                PT Education - 09/30/16 0941    Education provided Yes   Education Details d/c plans today, including return PT, OT, speech screens in 4-6 months   Person(s) Educated Patient   Methods Explanation   Comprehension Verbalized understanding             PT Long Term Goals - 09/30/16 0932      PT LONG TERM GOAL #1   Title Pt will be independent with HEP for improved balance, strength and gait.   Time 4   Period Weeks   Status Achieved     PT LONG TERM GOAL #2   Title Pt will improve TUG  socre to less than or equal to 18 seconds for decreased fall risk.   Baseline 17.85 sec 09/29/16   Time 4   Period Weeks   Status Achieved     PT LONG TERM GOAL #3   Title Pt will improve DGI score to at least 18/24 for decreased fall risk.   Baseline 12/24 DGI 09/29/16   Time 4   Period Weeks   Status Not Met     PT LONG TERM GOAL #4   Title Pt will ambulate at least 500 ft, indoor and outdoor surfaces, independently, with no LOB for improved gait safety and independence.   Time 4   Period  Weeks   Status Not Met     PT LONG TERM GOAL #5   Title Pt will verbalize understanding of fall prevention in the home environment.   Time 4   Period Weeks   Status Achieved               Plan - Oct 20, 2016 3559    Clinical Impression Statement Assessed LTGs today and ultimately discharged patient, with pt meeting 3 of 5 LTGs.  After discussion with patient, PT feels that pt's demeanor (more flat, more fatigued during the course of therapy), fatigue level, and reported/noted increase in Parkinson's symptoms recently, as well as limititations due to back pain warrant patient's follow-up with neurologist or movement disorders therapist prior to further therapy.  Pt in agreement and is discharged this visit.   Rehab Potential Good   PT Frequency 2x / week   PT Duration 4 weeks   PT Treatment/Interventions ADLs/Self Care Home Management;Gait training;DME Instruction;Functional mobility training;Therapeutic activities;Therapeutic exercise;Balance training;Neuromuscular re-education;Patient/family education   PT Next Visit Plan Discharge this visit; PT recommended PT, OT, speech screens in 4-6 months   Consulted and Agree with Plan of Care Patient      Patient will benefit from skilled therapeutic intervention in order to improve the following deficits and impairments:  Abnormal gait, Decreased balance, Decreased mobility, Decreased strength, Difficulty walking, Postural dysfunction  Visit  Diagnosis: Unsteadiness on feet  Other abnormalities of gait and mobility       G-Codes - 10/20/16 0945    Functional Assessment Tool Used (Outpatient Only) TUG 17.95 sec, DGI 12/24   Functional Limitation Mobility: Walking and moving around   Mobility: Walking and Moving Around Goal Status [phone]) At least 1 percent but less than 20 percent impaired, limited or restricted   Mobility: Walking and Moving Around Discharge Status [phone]) At least 20 percent but less than 40 percent impaired, limited or restricted      Problem List Patient Active Problem List   Diagnosis Date Noted  . Tachy-brady syndrome ([place]) 06/23/2016  . Closed subcapital fracture of left femur ([place]) 06/23/2016  . Orthostatic hypotension due to Parkinson's disease ([place]) 06/23/2016  . Parkinson disease ([place]) 04/21/2016  . Hypokalemia 04/21/2016  . Normal coronary arteries 2001 05/08/2014  . Pacemaker 08/10/2012  . Aortic insufficiency 08/10/2012  . Constipation 12/27/2011  . Family hx of colon cancer 12/27/2011  . HTN (hypertension) 07/06/2011  . Hypothyroidism 07/05/2011  . Paroxysmal atrial fibrillation ([place]) 07/05/2011  . Sinus pause, 7 seconds post conversion, S/P MDT pacemaker 07/05/11 07/05/2011  . Dyslipidemia 07/05/2011  . Angina at rest, with rapid AF 07/05/2011    Abigail Taylor W. Oct 20, 2016, 9:45 AM  Frazier Butt., PT   [facility] [address] [place], [place], [address] Phone: [phone]   Fax:  [phone]  Name: DAYLEEN BESKE MRN: [id number] Date of Birth: [birth year]   PHYSICAL THERAPY DISCHARGE SUMMARY  Visits from Start of Care: 6  Current functional level related to goals / functional outcomes: Pt has met 3 of 5 long term goals-see above    Remaining deficits: Posture, balance, gait (pain limiting gait due to longstanding history of back pain)   Education / Equipment: Educated in [employer], fall prevention,  Parkinson's resources.  Plan: Patient agrees to discharge.  Patient goals were partially met. Patient is being discharged due to                                                     ?????  maximizing rehab potential at this time.  PT recommends follow-up with neurologist or movement disorders specialist to address pt's reported/noted increase in Parkinson's symptoms affecting daily activities and progress with therapy.  Recommend PT, OT, speech therapy screens in 4-6 months.  Mady Haagensen, PT 09/30/16 9:49 AM Phone: [phone] Fax: [phone]

## 2016-10-04 DIAGNOSIS — E785 Hyperlipidemia, unspecified: Secondary | ICD-10-CM | POA: Diagnosis not present

## 2016-10-04 DIAGNOSIS — G20 Parkinson's disease: Secondary | ICD-10-CM | POA: Diagnosis not present

## 2016-10-04 DIAGNOSIS — Z681 Body mass index (BMI) 19 or less, adult: Secondary | ICD-10-CM | POA: Diagnosis not present

## 2016-10-04 DIAGNOSIS — T148XXA Other injury of unspecified body region, initial encounter: Secondary | ICD-10-CM | POA: Diagnosis not present

## 2016-10-04 DIAGNOSIS — Z23 Encounter for immunization: Secondary | ICD-10-CM | POA: Diagnosis not present

## 2016-10-04 DIAGNOSIS — Z79899 Other long term (current) drug therapy: Secondary | ICD-10-CM | POA: Diagnosis not present

## 2016-10-04 DIAGNOSIS — I10 Essential (primary) hypertension: Secondary | ICD-10-CM | POA: Diagnosis not present

## 2016-10-04 DIAGNOSIS — S5002XA Contusion of left elbow, initial encounter: Secondary | ICD-10-CM | POA: Diagnosis not present

## 2016-10-04 DIAGNOSIS — E039 Hypothyroidism, unspecified: Secondary | ICD-10-CM | POA: Diagnosis not present

## 2016-10-13 DIAGNOSIS — X32XXXD Exposure to sunlight, subsequent encounter: Secondary | ICD-10-CM | POA: Diagnosis not present

## 2016-10-13 DIAGNOSIS — L570 Actinic keratosis: Secondary | ICD-10-CM | POA: Diagnosis not present

## 2016-10-13 DIAGNOSIS — Z08 Encounter for follow-up examination after completed treatment for malignant neoplasm: Secondary | ICD-10-CM | POA: Diagnosis not present

## 2016-10-13 DIAGNOSIS — L820 Inflamed seborrheic keratosis: Secondary | ICD-10-CM | POA: Diagnosis not present

## 2016-10-13 DIAGNOSIS — Z85828 Personal history of other malignant neoplasm of skin: Secondary | ICD-10-CM | POA: Diagnosis not present

## 2016-10-25 ENCOUNTER — Other Ambulatory Visit: Payer: Self-pay | Admitting: Cardiovascular Disease

## 2016-10-31 DIAGNOSIS — G20 Parkinson's disease: Secondary | ICD-10-CM | POA: Diagnosis not present

## 2016-10-31 DIAGNOSIS — I10 Essential (primary) hypertension: Secondary | ICD-10-CM | POA: Diagnosis not present

## 2016-10-31 DIAGNOSIS — M545 Low back pain: Secondary | ICD-10-CM | POA: Diagnosis not present

## 2016-10-31 DIAGNOSIS — Z79899 Other long term (current) drug therapy: Secondary | ICD-10-CM | POA: Diagnosis not present

## 2016-11-03 ENCOUNTER — Ambulatory Visit (INDEPENDENT_AMBULATORY_CARE_PROVIDER_SITE_OTHER): Payer: Medicare Other | Admitting: *Deleted

## 2016-11-03 ENCOUNTER — Telehealth: Payer: Self-pay | Admitting: Cardiology

## 2016-11-03 DIAGNOSIS — I495 Sick sinus syndrome: Secondary | ICD-10-CM | POA: Diagnosis not present

## 2016-11-03 NOTE — Telephone Encounter (Signed)
LMOVM reminding pt to send remote transmission.   

## 2016-11-04 ENCOUNTER — Encounter: Payer: Self-pay | Admitting: Cardiology

## 2016-11-04 NOTE — Progress Notes (Signed)
Remote pacemaker transmission.   

## 2016-11-10 DIAGNOSIS — H04123 Dry eye syndrome of bilateral lacrimal glands: Secondary | ICD-10-CM | POA: Diagnosis not present

## 2016-11-14 LAB — CUP PACEART REMOTE DEVICE CHECK
Battery Impedance: 303 Ohm
Battery Remaining Longevity: 129 mo
Battery Voltage: 2.79 V
Brady Statistic AP VP Percent: 0 %
Brady Statistic AS VP Percent: 0 %
Implantable Lead Implant Date: 20130611
Implantable Lead Location: 753859
Implantable Lead Model: 5076
Implantable Lead Model: 5076
Implantable Pulse Generator Implant Date: 20130611
Lead Channel Impedance Value: 430 Ohm
Lead Channel Pacing Threshold Amplitude: 0.625 V
Lead Channel Pacing Threshold Amplitude: 0.75 V
Lead Channel Pacing Threshold Pulse Width: 0.4 ms
Lead Channel Setting Pacing Amplitude: 1.5 V
Lead Channel Setting Pacing Amplitude: 2 V
Lead Channel Setting Pacing Pulse Width: 0.4 ms
MDC IDC LEAD IMPLANT DT: 20130611
MDC IDC LEAD LOCATION: 753860
MDC IDC MSMT LEADCHNL RA PACING THRESHOLD PULSEWIDTH: 0.4 ms
MDC IDC MSMT LEADCHNL RV IMPEDANCE VALUE: 563 Ohm
MDC IDC SESS DTM: 20181012173843
MDC IDC SET LEADCHNL RV SENSING SENSITIVITY: 5.6 mV
MDC IDC STAT BRADY AP VS PERCENT: 20 %
MDC IDC STAT BRADY AS VS PERCENT: 80 %

## 2016-11-15 DIAGNOSIS — I739 Peripheral vascular disease, unspecified: Secondary | ICD-10-CM | POA: Diagnosis not present

## 2016-11-18 DIAGNOSIS — E782 Mixed hyperlipidemia: Secondary | ICD-10-CM | POA: Diagnosis not present

## 2016-11-18 DIAGNOSIS — E039 Hypothyroidism, unspecified: Secondary | ICD-10-CM | POA: Diagnosis not present

## 2016-11-18 DIAGNOSIS — I10 Essential (primary) hypertension: Secondary | ICD-10-CM | POA: Diagnosis not present

## 2016-11-18 DIAGNOSIS — Z681 Body mass index (BMI) 19 or less, adult: Secondary | ICD-10-CM | POA: Diagnosis not present

## 2016-11-18 DIAGNOSIS — K219 Gastro-esophageal reflux disease without esophagitis: Secondary | ICD-10-CM | POA: Diagnosis not present

## 2016-11-18 DIAGNOSIS — G20 Parkinson's disease: Secondary | ICD-10-CM | POA: Diagnosis not present

## 2016-11-24 DIAGNOSIS — X32XXXD Exposure to sunlight, subsequent encounter: Secondary | ICD-10-CM | POA: Diagnosis not present

## 2016-11-24 DIAGNOSIS — L718 Other rosacea: Secondary | ICD-10-CM | POA: Diagnosis not present

## 2016-11-24 DIAGNOSIS — L570 Actinic keratosis: Secondary | ICD-10-CM | POA: Diagnosis not present

## 2016-11-24 DIAGNOSIS — C44311 Basal cell carcinoma of skin of nose: Secondary | ICD-10-CM | POA: Diagnosis not present

## 2016-11-30 ENCOUNTER — Encounter: Payer: Self-pay | Admitting: Neurology

## 2016-12-26 ENCOUNTER — Other Ambulatory Visit: Payer: Self-pay | Admitting: Cardiovascular Disease

## 2017-01-06 NOTE — Progress Notes (Signed)
Abigail Taylor was seen today in the movement disorders clinic for neurologic consultation at the request of [name], MD.  The consultation is for the evaluation of PD.  The records that were made available to me were reviewed.  Pt previously has seen Dr. Merlene Laughter.  Unfortunately, his records are not present.  Reviewed PCP, cardiology and PT notes.  The first symptom(s) the patient noticed was R hand and foot tremor and this was 2 years.  She is currently on carbidopa/levodopa 25/100 three per day, at 7:30am/12:30pm/bedtime.  She has some trouble getting going in the AM.    Specific Symptoms:  Tremor: Yes.  , mostly when distracted.  R arm and leg Family hx of similar:  No. Voice: "I lose my voice" - never done LSVT Loud Sleep: sleeps well  Vivid Dreams:  Yes.    Acting out dreams:  Yes.   fell out of bed in May while asleep and fx hip.  Had to have surgery.  Sometimes awakens out of bed screaming. Wet Pillows: No. Postural symptoms:  Yes.    Falls?  No. Bradykinesia symptoms: shuffling gait, slow movements and difficulty getting out of a chair ; some daytime drooling Loss of smell:  No. Loss of taste:  No. Urinary Incontinence:  No. Difficulty Swallowing:  No. Handwriting, micrographia: Yes.   Trouble with ADL's:  Yes.   (couldn't finish OT because length to drive)  Trouble buttoning clothing: Yes.   Depression:  No. Memory changes:  No. Hallucinations:  No.  visual distortions: No. N/V:  No. Lightheaded:  No. (placed on florinef 2 years ago and it helped)  Syncope: No. Diplopia:  No. Dyskinesia:  No.  Neuroimaging of the brain has not previously been performed.    PREVIOUS MEDICATIONS: none to date  ALLERGIES:  No Known Allergies  CURRENT MEDICATIONS:  Outpatient Encounter Medications as of 01/10/2017  Medication Sig  . acetaminophen (TYLENOL) 325 MG tablet Take 2 tablets (650 mg total) by mouth every 6 (six) hours as needed for mild pain (or Fever >/= 101).    . carbidopa-levodopa (SINEMET IR) 25-100 MG per tablet Take 1 tablet by mouth 3 (three) times daily.   Marland Kitchen ELIQUIS 2.5 MG TABS tablet TAKE (1) TABLET BY MOUTH TWICE DAILY.  . fludrocortisone (FLORINEF) 0.1 MG tablet TAKE ONE TABLET BY MOUTH ONCE DAILY.  Marland Kitchen levothyroxine (SYNTHROID, LEVOTHROID) 75 MCG tablet Take 75 mcg by mouth daily before breakfast.  . rosuvastatin (CRESTOR) 10 MG tablet Take 10 mg by mouth at bedtime. Reported on 07/09/2015  . senna-docusate (SENOKOT-S) 8.6-50 MG tablet Take 1 tablet by mouth at bedtime as needed for mild constipation.  . [DISCONTINUED] levothyroxine (SYNTHROID, LEVOTHROID) 50 MCG tablet Take 50 mcg by mouth daily before breakfast.  . XIIDRA 5 % SOLN   . [DISCONTINUED] cycloSPORINE (RESTASIS) 0.05 % ophthalmic emulsion 1 drop at bedtime.  . [DISCONTINUED] diltiazem (CARDIZEM) 30 MG tablet Take 30 mg by mouth daily as needed.  . [DISCONTINUED] hydroxypropyl methylcellulose / hypromellose (ISOPTO TEARS / GONIOVISC) 2.5 % ophthalmic solution Place 1 drop into both eyes as needed for dry eyes (uses ointment).  . [DISCONTINUED] silver sulfADIAZINE (SILVADENE) 1 % cream Apply 1 application topically daily.   No facility-administered encounter medications on file as of 01/10/2017.     PAST MEDICAL HISTORY:   Past Medical History:  Diagnosis Date  . Angina at rest, with the tachycardia 07/05/2011  . Aortic insufficiency 07/05/2011  . Atrial fibrillation ([place])   .  Brady-tachy syndrome ([place])    Dual chamber Medtronic pacemaker Adapta  . Chronic back pain   . Constipation 12/27/2011  . Gait instability   . Hyperlipidemia   . Hypothyroidism   . Normal cardiac stress test 2013  . OA (osteoarthritis)   . Pacemaker   . Parkinson's disease ([place])   . Tremor     PAST SURGICAL HISTORY:   Past Surgical History:  Procedure Laterality Date  . ABDOMINAL HYSTERECTOMY    . CARDIAC CATHETERIZATION  05/26/1999   normal  . CARDIAC CATHETERIZATION  2001   normal coronary  arteries  . cataract    . COLONOSCOPY N/A 11/15/2012   Procedure: COLONOSCOPY;  Surgeon: [name], MD;  [facility];  Service: Endoscopy;  Laterality: N/A;  1030  . HIP ARTHROPLASTY Left 06/24/2016   Procedure: ARTHROPLASTY BIPOLAR HIP (HEMIARTHROPLASTY);  Surgeon: Altamese , MD;  Location: [facility];  Service: Orthopedics;  Laterality: Left;  . INSERT / REPLACE / REMOVE PACEMAKER    . NM MYOVIEW LTD  07/19/2011   normal  . PERMANENT PACEMAKER INSERTION  07/05/2011   Medtronic Adapta dual chamber  . PERMANENT PACEMAKER INSERTION N/A 07/05/2011   Procedure: PERMANENT PACEMAKER INSERTION;  Surgeon: [name], MD;  Location: [facility];  Service: Cardiovascular;  Laterality: N/A;    SOCIAL HISTORY:   Social History   Socioeconomic History  . Marital status: Married    Spouse name: Not on file  . Number of children: Not on file  . Years of education: Not on file  . Highest education level: Not on file  Social Needs  . Financial resource strain: Not on file  . Food insecurity - worry: Not on file  . Food insecurity - inability: Not on file  . Transportation needs - medical: Not on file  . Transportation needs - non-medical: Not on file  Occupational History  . Occupation: retired    Comment: Air cabin crew Tobacco  Tobacco Use  . Smoking status: Never Smoker  . Smokeless tobacco: Never Used  Substance and Sexual Activity  . Alcohol use: No    Alcohol/week: 0.0 oz  . Drug use: No  . Sexual activity: Not on file  Other Topics Concern  . Not on file  Social History Narrative  . Not on file    FAMILY HISTORY:   Family Status  Relation Name Status  . Mother  Deceased       MI, colon cancer at age 49  . Father  Deceased       Kidney cancer  . Sister 2 Deceased       etoh abuse, one had kidney cancer  . Brother 1 Alive       good health  . Son [employer]       good health  . Daughter 1 Alive       good health  . Son  Deceased    ROS:  A  complete 10 system review of systems was obtained and was unremarkable apart from what is mentioned above.  PHYSICAL EXAMINATION:    VITALS:   Vitals:   01/10/17 0937  BP: 120/68  Pulse: 74  SpO2: 97%  Weight: 123 lb (55.8 kg)  Height: '5\' 6"'$  (1.676 m)    GEN:  The patient appears stated age and is in NAD. HEENT:  Normocephalic, atraumatic.  The mucous membranes are moist. The superficial temporal arteries are without ropiness or tenderness. CV:  RRR Lungs:  CTAB Neck/HEME:  There are no carotid bruits bilaterally.  Neurological examination:  Orientation: The patient is alert and oriented x3. Fund of knowledge is appropriate.  Recent and remote memory are intact.  Attention and concentration are normal.    Able to name objects and repeat phrases. Cranial nerves: There is good facial symmetry. There is facial hypomimia.  Pupils are equal round and reactive to light bilaterally. Fundoscopic exam reveals clear margins bilaterally. Extraocular muscles are intact. The visual fields are full to confrontational testing. The speech is fluent and clear. She is very hypophonic.Soft palate rises symmetrically and there is no tongue deviation. Hearing is intact to conversational tone. Sensation: Sensation is intact to light and pinprick throughout (facial, trunk, extremities). Vibration is intact at the bilateral big toe. There is no extinction with double simultaneous stimulation. There is no sensory dermatomal level identified. Motor: Strength is 5/5 in the bilateral upper and lower extremities.   Shoulder shrug is equal and symmetric.  There is no pronator drift. Deep tendon reflexes: Deep tendon reflexes are 2+/4 at the bilateral biceps, triceps, brachioradialis, patella and achilles. Plantar responses are downgoing bilaterally.  Movement examination: Tone: There is axial rigidity.  There is mod LUE resting tremor and mild RUE resting tremor.  There is mod rigidity on the RLE.  (she has not taken  medication today) Abnormal movements: There is RUE resting tremor Coordination:  There is  decremation with RAM's, with any form of RAMS, including alternating supination and pronation of the forearm, hand opening and closing, finger taps, heel taps and toe taps, L more than R. Gait and Station: The patient has minimal difficulty arising out of a deep-seated chair without the use of the hands. The patient's stride length is normal with good arm swing and some camptocormia.  She is somewhat unsteady  ASSESSMENT/PLAN:  1.  Idiopathic Parkinson's disease.  The patient has tremor, bradykinesia, rigidity and mild postural instability.  This was dx in approx 2016  -We discussed the diagnosis as well as pathophysiology of the disease.  We discussed treatment options as well as prognostic indicators.  Patient education was provided.  -We discussed that it used to be thought that levodopa would increase risk of melanoma but now it is believed that Parkinsons itself likely increases risk of melanoma. she is to get regular skin checks.  -Greater than 50% of the 80 minute visit was spent in counseling answering questions and talking about what to expect now as well as in the future.  We talked about medication options as well as potential future surgical options.  We talked about safety in the home.  -We decided to change the dosing of her carbidopa/levodopa 25/100 so that she takes it at 7 AM/11 AM/4 PM.  She can take it with a carbohydrate since it causes nausea.  Discussed interaction with protein.  We will add carbidopa/levodopa 50/200 at bedtime since she has trouble with morning "on."  Risks, benefits, side effects and alternative therapies were discussed.  The opportunity to ask questions was given and they were answered to the best of my ability.  The patient expressed understanding and willingness to follow the outlined treatment protocols.  -I will refer the patient to the Parkinson's program at [facility], for PT/ST.  We talked about the importance of safe, cardiovascular exercise in Parkinson's disease.  -We discussed community resources in the area including patient support groups and community exercise programs for PD and pt education was provided to the patient.  -She met with  my Education officer, museum today.  2.  RBD  -Patient fell out of bed while asleep in May, 2018 and fractured her hip.  Talked about the fact that REM behavior disorder can obviously be very serious and result in hip fractures and bleeding in the brain.  This should not be ignored and requires treatment.  We talked about medical treatment.  We also talked about using bed rails.  She actually is doing this now and feels much more comfortable.  She really does not want to add an additional medication.  We talked about moving the bedside stand further away from the bed.  We talked about safety in the bedroom.  3.  Orthostatic hypotension  -on florinef and doing well.  She is asymptomatic.  4.  Sialorrhea  -This is commonly associated with PD.  We talked about treatments.  The patient is not a candidate for oral anticholinergic therapy because of increased risk of confusion and falls.  We discussed myobloc and 1% atropine drops.  We discusssed that candy like lemon drops can help by stimulating mm of the oropharynx to induce swallowing.  5.  I will see her back in follow-up in the next 4 months, sooner should new neurologic issues arise.  Much greater than 50% of this visit was spent in counseling and coordinating care.  Total face to face time:  60 min   Cc:  [name], MD

## 2017-01-10 ENCOUNTER — Encounter: Payer: Self-pay | Admitting: Neurology

## 2017-01-10 ENCOUNTER — Encounter: Payer: Self-pay | Admitting: Psychology

## 2017-01-10 ENCOUNTER — Ambulatory Visit (INDEPENDENT_AMBULATORY_CARE_PROVIDER_SITE_OTHER): Payer: Medicare Other | Admitting: Neurology

## 2017-01-10 VITALS — BP 120/68 | HR 74 | Ht 66.0 in | Wt 123.0 lb

## 2017-01-10 DIAGNOSIS — G4752 REM sleep behavior disorder: Secondary | ICD-10-CM

## 2017-01-10 DIAGNOSIS — G20 Parkinson's disease: Secondary | ICD-10-CM

## 2017-01-10 DIAGNOSIS — K117 Disturbances of salivary secretion: Secondary | ICD-10-CM

## 2017-01-10 DIAGNOSIS — G903 Multi-system degeneration of the autonomic nervous system: Secondary | ICD-10-CM | POA: Diagnosis not present

## 2017-01-10 MED ORDER — CARBIDOPA-LEVODOPA ER 50-200 MG PO TBCR: 1.0000 | EXTENDED_RELEASE_TABLET | Freq: Every day | ORAL | 1 refills | Status: DC

## 2017-01-10 NOTE — Patient Instructions (Addendum)
1. Take Carbidopa Levodopa 25/100 IR - 1 tablet at 7 am. 1 tablet at 11 am. 1 tablet at 4 pm.  Start Carbidopa Levodopa 50/200 CR - 1 at bedtime.   2. Referral sent to [facility] for physical and speech therapy. They should contact you to schedule. If you do not hear from them you can contact them at [phone].

## 2017-01-11 NOTE — Progress Notes (Signed)
I met with the patient while she was in clinic today.  We talked about resources that are available in the community for people living with Parkinson's disease.  The patient is a little bit closer logistically [place]. She appeared interested in possibly attending the support group that is closer to her.  In addition, she is very thankful that Dr. Carles Collet be referring her to rehab closer to her home.  The patient contact information and encouraged her to call me with any questions or needs that she may have.

## 2017-01-25 ENCOUNTER — Other Ambulatory Visit: Payer: Self-pay | Admitting: Cardiovascular Disease

## 2017-01-26 ENCOUNTER — Encounter (HOSPITAL_COMMUNITY): Payer: Self-pay | Admitting: Speech Pathology

## 2017-01-26 ENCOUNTER — Encounter (HOSPITAL_COMMUNITY): Payer: Self-pay

## 2017-01-26 ENCOUNTER — Other Ambulatory Visit: Payer: Self-pay

## 2017-01-26 ENCOUNTER — Ambulatory Visit (HOSPITAL_COMMUNITY): Payer: Medicare Other | Attending: Neurology

## 2017-01-26 ENCOUNTER — Ambulatory Visit (HOSPITAL_COMMUNITY): Payer: Medicare Other | Admitting: Speech Pathology

## 2017-01-26 DIAGNOSIS — R2689 Other abnormalities of gait and mobility: Secondary | ICD-10-CM | POA: Insufficient documentation

## 2017-01-26 DIAGNOSIS — R2681 Unsteadiness on feet: Secondary | ICD-10-CM | POA: Insufficient documentation

## 2017-01-26 DIAGNOSIS — R293 Abnormal posture: Secondary | ICD-10-CM | POA: Diagnosis not present

## 2017-01-26 DIAGNOSIS — M6281 Muscle weakness (generalized): Secondary | ICD-10-CM | POA: Diagnosis not present

## 2017-01-26 DIAGNOSIS — R471 Dysarthria and anarthria: Secondary | ICD-10-CM

## 2017-01-26 NOTE — Patient Instructions (Signed)
  BRIDGING  While lying on your back with knees bent, tighten your lower abdominals, squeeze your buttocks and then raise your buttocks off the floor/bed as creating a "Bridge" with your body. Hold and then lower yourself and repeat.  Perform 1x/day, 2-3 sets of 10 reps  SIT TO STAND - NO SUPPORT  Start by scooting close to the front of the chair.  Next, lean forward at your trunk and reach forward with your arms and rise to standing without using your hands to push off from the chair or other object.   Use your arms as a counter-balance by reaching forward when in sitting and lower them as you approach standing.   Perform 1x/day, 2-3 sets of 10 reps

## 2017-01-26 NOTE — Therapy (Signed)
[place], [place], [address] Phone: [phone]   Fax:  [phone]  Speech Language Pathology Evaluation  Patient Details  Name: Abigail Taylor MRN: [id number] Date of Birth: 1931/10/26 Referring Provider: Alonza Bogus, DO   Encounter Date: 01/26/2017  End of Session - 01/26/17 1717    Visit Number  1    Number of Visits  9    Date for SLP Re-Evaluation  02/28/17    Authorization Type  Generic Commercial Medicare    SLP Start Time  1352    SLP Stop Time   1430    SLP Time Calculation (min)  38 min    Activity Tolerance  Patient tolerated treatment well       Past Medical History:  Diagnosis Date  . Angina at rest, with the tachycardia 07/05/2011  . Aortic insufficiency 07/05/2011  . Atrial fibrillation ([place])   . Brady-tachy syndrome ([place])    Dual chamber Medtronic pacemaker Adapta  . Chronic back pain   . Constipation 12/27/2011  . Gait instability   . Hyperlipidemia   . Hypothyroidism   . Normal cardiac stress test 2013  . OA (osteoarthritis)   . Pacemaker   . Parkinson's disease ([place])   . Tremor     Past Surgical History:  Procedure Laterality Date  . ABDOMINAL HYSTERECTOMY    . CARDIAC CATHETERIZATION  05/26/1999   normal  . CARDIAC CATHETERIZATION  2001   normal coronary arteries  . cataract    . COLONOSCOPY N/A 11/15/2012   Procedure: COLONOSCOPY;  Surgeon: [name], MD;  [facility];  Service: Endoscopy;  Laterality: N/A;  1030  . HIP ARTHROPLASTY Left 06/24/2016   Procedure: ARTHROPLASTY BIPOLAR HIP (HEMIARTHROPLASTY);  Surgeon: [place], MD;  Location: [facility];  Service: Orthopedics;  Laterality: Left;  . INSERT / REPLACE / REMOVE PACEMAKER    . NM MYOVIEW LTD  07/19/2011   normal  . PERMANENT PACEMAKER INSERTION  07/05/2011   Medtronic Adapta dual chamber  . PERMANENT PACEMAKER INSERTION N/A 07/05/2011   Procedure: PERMANENT PACEMAKER INSERTION;  Surgeon: [name], MD;  Location: [facility];  Service: Cardiovascular;  Laterality: N/A;    There were no vitals filed for this visit.  Subjective Assessment - 01/26/17 1711    Subjective  "My voice."    Currently in Pain?  No/denies         SLP Evaluation [facility] - 01/26/17 1711      SLP Visit Information   SLP Received On  01/26/17    Referring Provider  Wells Guiles Tat, DO    Onset Date  2016    Medical Diagnosis  Parkinson's disease      Subjective   Subjective  "People say they can't hear me."    Patient/Family Stated Goal  Improve voice      General Information   HPI  Abigail Taylor is an 84 yo woman who was referred for SLP therapy by Dr. Wells Guiles Tat due to Pt with a diagnosis of Parkinson's disease (~2016). She was receving PT in [place], but requested to have therapy closer to her home. She denies dysphagia. Her primary complaint is difficulty being understood due to hypophonia. She lives at home with her husband. She no longer drives, but remains physically active around the home. She previously sang in the choir at church, but stopped a couple of years ago due to dysphonia. She takes carbidopa/levodopa at 7AM, 11AM, 4  PM, and at bedtime. Pt also reports sialorrhea. Her primary physician is [name], MD.     Behavioral/Cognition  Alert and cooperative    Mobility Status  ambulatory      Balance Screen   Has the patient fallen in the past 6 months  No    Has the patient had a decrease in activity level because of a fear of falling?   No    Is the patient reluctant to leave their home because of a fear of falling?   No      Prior Functional Status   Cognitive/Linguistic Baseline  Within functional limits    Type of Home  House     Lives With  Spouse    Available Support  Family      Cognition   Overall Cognitive Status  Within Functional Limits for tasks assessed      Auditory Comprehension   Overall Auditory Comprehension  Appears within functional limits for tasks  assessed    Yes/No Questions  Within Functional Limits    Commands  Within Functional Limits    Conversation  Complex      Visual Recognition/Discrimination   Discrimination  Within Function Limits      Reading Comprehension   Reading Status  Not tested      Expression   Primary Mode of Expression  Verbal      Verbal Expression   Overall Verbal Expression  Appears within functional limits for tasks assessed    Initiation  No impairment    Automatic Speech  Name;Social Response    Level of Generative/Spontaneous Verbalization  Conversation      Written Expression   Written Expression  Not tested      Oral Motor/Sensory Function   Overall Oral Motor/Sensory Function  Appears within functional limits for tasks assessed      Motor Speech   Overall Motor Speech  Impaired    Respiration  Impaired    Level of Impairment  Sentence    Phonation  Breathy;Low vocal intensity diplophonia    Resonance  Within functional limits    Articulation  Within functional limitis    Intelligibility  Intelligibility reduced    Word  75-100% accurate    Phrase  75-100% accurate    Sentence  75-100% accurate    Conversation  75-100% accurate    Motor Planning  Witnin functional limits    Effective Techniques  Increased vocal intensity    Phonation  Impaired    Volume  Soft;Decibel Level 64    Pitch  Appropriate        SLP Short Term Goals - 01/26/17 1730      SLP SHORT TERM GOAL #1   Title  The patient will use a strong, clear voice when communicating with family, friends and/or colleagues resulting in an 80% reduction in requests for repetition.    Baseline  Pt reports frequent requests for clarification during conversations    Time  4    Period  Weeks    Status  New    Target Date  02/28/17      SLP SHORT TERM GOAL #2   Title  Regularly practice voice building/strengthening exercises a minimum of 5 days/week for a 20+ minutes a day.    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 Articulation is [facility] for multisyllabic words, however intelligibility is reduced when Pt is not in a quiet environment due to dysphonia. Pt reports feeling frustrated when people cannot hear her and ask her to repeat herself. Pt will benefit from skilled SLP in order to address the above impairments, maximize independence, and decrease burden of care.   Speech Therapy Frequency  2x / week    Duration  4 weeks    Treatment/Interventions  SLP instruction and feedback;Cueing hierarchy;Compensatory strategies;Compensatory techniques;Patient/family education    Potential to Achieve Goals  Good    Potential Considerations  Severity of impairments    SLP Home Exercise Plan  Pt will complete HEP as assigned to facilitate carryover of treatment strategies and techniques in home environment with use of written cues as needed.    Consulted and Agree with Plan of Care  Patient       Patient will benefit from skilled therapeutic intervention in order to improve the following deficits and impairments:   Dysarthria and anarthria    Problem List Patient Active Problem List   Diagnosis Date Noted  . RBD (REM behavioral disorder) 01/10/2017  . Neurogenic orthostatic hypotension ([place]) 01/10/2017  . Tachy-brady syndrome ([place]) 06/23/2016  . Closed subcapital fracture of left femur ([place]) 06/23/2016  . Orthostatic hypotension due to Parkinson's disease ([place]) 06/23/2016  . PD (Parkinson's disease) ([place]) 04/21/2016  . Hypokalemia 04/21/2016  . Normal coronary arteries 2001 05/08/2014  . Pacemaker 08/10/2012  . Aortic insufficiency 08/10/2012  . Constipation  12/27/2011  . Family hx of colon cancer 12/27/2011  . HTN (hypertension) 07/06/2011  . Hypothyroidism 07/05/2011  . Paroxysmal atrial fibrillation ([place]) 07/05/2011  . Sinus pause, 7 seconds post conversion, S/P MDT pacemaker 07/05/11 07/05/2011  . Dyslipidemia 07/05/2011  . Angina at rest, with rapid AF 07/05/2011   Thank you,  Genene Churn, [place]  [facility] 01/26/2017, 5:35 PM  [facility] [address] [place], [place], [address] Phone: [phone]   Fax:  [phone]  Name: Abigail Taylor MRN: [id number] Date of Birth: 1931/11/04

## 2017-01-26 NOTE — Therapy (Signed)
[facility] [address] [place], [place], [address] Phone: [phone]   Fax:  [phone]  Physical Therapy Evaluation  Patient Details  Name: Abigail Taylor MRN: [id number] Date of Birth: Apr 08, 1931 Referring Provider: Alonza Bogus, DO   Encounter Date: 01/26/2017  PT End of Session - 01/26/17 1343    Visit Number  1    Number of Visits  13    Date for PT Re-Evaluation  02/16/17    Authorization Type  Medicare (secondary: generic commercial)    PT Start Time  1300    PT Stop Time  1342    PT Time Calculation (min)  42 min    Equipment Utilized During Treatment  Gait belt    Activity Tolerance  Patient tolerated treatment well    Behavior During Therapy  [facility] for tasks assessed/performed       Past Medical History:  Diagnosis Date  . Angina at rest, with the tachycardia 07/05/2011  . Aortic insufficiency 07/05/2011  . Atrial fibrillation ([place])   . Brady-tachy syndrome ([place])    Dual chamber Medtronic pacemaker Adapta  . Chronic back pain   . Constipation 12/27/2011  . Gait instability   . Hyperlipidemia   . Hypothyroidism   . Normal cardiac stress test 2013  . OA (osteoarthritis)   . Pacemaker   . Parkinson's disease ([place])   . Tremor     Past Surgical History:  Procedure Laterality Date  . ABDOMINAL HYSTERECTOMY    . CARDIAC CATHETERIZATION  05/26/1999   normal  . CARDIAC CATHETERIZATION  2001   normal coronary arteries  . cataract    . COLONOSCOPY N/A 11/15/2012   Procedure: COLONOSCOPY;  Surgeon: [name], MD;  [facility];  Service: Endoscopy;  Laterality: N/A;  1030  . HIP ARTHROPLASTY Left 06/24/2016   Procedure: ARTHROPLASTY BIPOLAR HIP (HEMIARTHROPLASTY);  Surgeon: Altamese [place], MD;  Location: [facility];  Service: Orthopedics;  Laterality: Left;  . INSERT / REPLACE / REMOVE PACEMAKER    . NM MYOVIEW LTD  07/19/2011   normal  . PERMANENT PACEMAKER INSERTION  07/05/2011   Medtronic Adapta dual  chamber  . PERMANENT PACEMAKER INSERTION N/A 07/05/2011   Procedure: PERMANENT PACEMAKER INSERTION;  Surgeon: [name], MD;  Location: [facility];  Service: Cardiovascular;  Laterality: N/A;    There were no vitals filed for this visit.   Subjective Assessment - 01/26/17 1305    Subjective  Pt states that Dr. Carles Collet referred her here for Parkinson's Disease, which Dr. Carles Collet told pt was probably in the early stages. She reports that she is sometimes shaky and that getting up out of a chair is difficult for her to do. She states that her R side shakes. She reports only 1 fall in the last 6 months when she fell out of bed and broke her L hip; she underwent  THA on 06/24/16 via posterior approach. She states that getting up and down like out of a car or chair without arms are her biggest complaints. She said that her balance has been off as well. She reports some steadily progressive weakness and reports increased stiffness in her back and L hip.     Limitations  Walking;Standing;House hold activities    How long can you sit comfortably?  no issues    How long can you stand comfortably?  back limits this    How long can you walk comfortably?  2-3 laps around gym at  old therapy place    Patient Stated Goals  feel better    Currently in Pain?  No/denies         [facility] PT Assessment - 01/26/17 0001      Assessment   Medical Diagnosis  Parkinson's Disease    Referring Provider  Alonza Bogus, DO    Next MD Visit  March 2019    Prior Therapy  yes for L THA (d/c'd 08/2016)      Balance Screen   Has the patient fallen in the past 6 months  No    Has the patient had a decrease in activity level because of a fear of falling?   Yes    Is the patient reluctant to leave their home because of a fear of falling?   No      Prior Function   Level of Independence  Independent min A for sit <> stands    Leisure  going to [employer] Test  WNL, L limited due to L hip weakness but  no issues connecting with R shin      ROM / Strength   AROM / PROM / Strength  Strength      Strength   Strength Assessment Site  Hip;Knee;Ankle    Right Hip Flexion  5/5    Right Hip Extension  3/5    Right Hip ABduction  4+/5    Left Hip Flexion  5/5    Left Hip Extension  3/5    Right Knee Flexion  5/5    Right Knee Extension  5/5    Left Knee Flexion  4+/5    Left Knee Extension  5/5    Right Ankle Dorsiflexion  4+/5    Left Ankle Dorsiflexion  4+/5      Bed Mobility   Bed Mobility  -- generally slowed, slight increased labored movement      Ambulation/Gait   Ambulation Distance (Feet)  -- 3MWT to be performed next visit      Balance   Balance Assessed  Yes      Static Standing Balance   Static Standing - Balance Support  No upper extremity supported    Static Standing Balance -  Activities   Single Leg Stance - Right Leg;Single Leg Stance - Left Leg    Static Standing - Comment/# of Minutes  R:11 sec L: 14 sec      Standardized Balance Assessment   Standardized Balance Assessment  Dynamic Gait Index;Timed Up and Go Test;Five Times Sit to Stand    Five times sit to stand comments   19.76 sec, chair, no UE      Dynamic Gait Index   Level Surface  Normal    Change in Gait Speed  Normal    Gait with Horizontal Head Turns  Mild Impairment    Gait with Vertical Head Turns  Mild Impairment    Gait and Pivot Turn  Mild Impairment    Step Over Obstacle  Normal    Step Around Obstacles  Normal    Steps  Moderate Impairment    Total Score  19      Timed Up and Go Test   Normal TUG (seconds)  12.3      Functional Gait  Assessment   Gait assessed   Yes FGA to be performed next visit          Objective measurements completed on examination: See above  findings.        PT Education - 01/26/17 1337    Education provided  Yes    Education Details  exam findings, POC, HEP    Person(s) Educated  Patient    Methods  Explanation;Demonstration;Handout     Comprehension  Verbalized understanding;Returned demonstration       PT Short Term Goals - 01/26/17 1658      PT SHORT TERM GOAL #1   Title  Pt will be independent with HEP and perform consistently in order to maximize strength and balance.    Time  3    Period  Weeks    Status  New    Target Date  02/16/17      PT SHORT TERM GOAL #2   Title  Pt will score at least 22/24 or > on the DGI to demo improved dynamic balance and decrease her risk for falls.     Time  3    Period  Weeks    Status  New      PT SHORT TERM GOAL #3   Title  Pt will be able to perform SLS for 30 seconds on BLE to maximize gait on uneven ground and decrease risk for falls.     Time  3    Period  Weeks    Status  New        PT Long Term Goals - 01/26/17 1700      PT LONG TERM GOAL #1   Title  Pt's MMT will improved to 4+/5 or greater of all muscle groups tested in order to maximize gait, balance, and allow pt to perform functional tasks with greater ease.     Time  6    Period  Weeks    Status  New    Target Date  03/09/17      PT LONG TERM GOAL #2   Title  Pt will be able to perform 5xSTS in 12 sec or < with no UE to demo improved functional strength and overall balance.     Time  6    Period  Weeks    Status  New      PT LONG TERM GOAL #3   Title  Pt will score 25/30 or > on the FGA to demo decreased risk for falling and maximize pt's ability to access her community.     Time  6    Period  Weeks    Status  New      PT LONG TERM GOAL #4   Title  Pt will be able to perform at least 645ft on the 3MWT maintaining 1.70m/s gait speed or > and good arm swing for proper mechanics to demo improved overall gait, strength, and endurance.    Time  6    Period  Weeks    Status  New             Plan - 01/26/17 1644    Clinical Impression Statement  Pt is pleasant 84 YO F who presents to OPPT with c/o increased weakness as well as balance and gait deficits s/p her recent Parkinson's Disease  diagnosis. Pt currently presents with deficits in MMT, especially of proximal hip musculature, slight increased tone of BLE, deficits in functional strength, balance, gait, and difficulty performing functional tasks such as sit <> stands and car transfers due to weakness. Pt scored 19/24 on the DGI which still puts her at risk for falling and she required 19 sec  to perform 5xSTS which also indicates pt is at a higher risk for falling. Pt demonstrated general bradykinesia during session and also demo'd min to no arm swing during gait. Pt needs skilled PT intervention to address these deficits in order to maximize her strength and decrease her risk for falling.     History and Personal Factors relevant to plan of care:  L THA 06/2016; Afib, chronic back pain, hyperlipidemia, hypothyroidism, OA, pacemaker    Clinical Presentation  Stable    Clinical Presentation due to:  MMT, balance, gait, 5xSTS, DGI, functional strength    Clinical Decision Making  Low    Rehab Potential  Fair    PT Frequency  2x / week    PT Duration  6 weeks    PT Treatment/Interventions  ADLs/Self Care Home Management;Cryotherapy;Electrical Stimulation;Moist Heat;Gait training;Stair training;Functional mobility training;Therapeutic activities;Therapeutic exercise;Balance training;Neuromuscular re-education;Patient/family education;Manual techniques;Passive range of motion;Dry needling;Energy conservation;Taping    PT Next Visit Plan  review goals; perform FGA, 3MWT; practice bed mobility, BLE and functional strengthening, postural strengthening, balance and gait training; can initiate PWR! for improved mobility, posture, and strength    PT Home Exercise Plan  eval: bridging, STS    Consulted and Agree with Plan of Care  Patient       Patient will benefit from skilled therapeutic intervention in order to improve the following deficits and impairments:  Decreased activity tolerance, Decreased balance, Decreased endurance, Decreased  mobility, Decreased strength, Difficulty walking, Hypomobility, Increased muscle spasms, Impaired flexibility, Improper body mechanics, Postural dysfunction  Visit Diagnosis: Muscle weakness (generalized) - Plan: PT plan of care cert/re-cert  Unsteadiness on feet - Plan: PT plan of care cert/re-cert  Other abnormalities of gait and mobility - Plan: PT plan of care cert/re-cert     Problem List Patient Active Problem List   Diagnosis Date Noted  . RBD (REM behavioral disorder) 01/10/2017  . Neurogenic orthostatic hypotension ([place]) 01/10/2017  . Tachy-brady syndrome ([place]) 06/23/2016  . Closed subcapital fracture of left femur ([place]) 06/23/2016  . Orthostatic hypotension due to Parkinson's disease ([place]) 06/23/2016  . PD (Parkinson's disease) ([place]) 04/21/2016  . Hypokalemia 04/21/2016  . Normal coronary arteries 2001 05/08/2014  . Pacemaker 08/10/2012  . Aortic insufficiency 08/10/2012  . Constipation 12/27/2011  . Family hx of colon cancer 12/27/2011  . HTN (hypertension) 07/06/2011  . Hypothyroidism 07/05/2011  . Paroxysmal atrial fibrillation ([place]) 07/05/2011  . Sinus pause, 7 seconds post conversion, S/P MDT pacemaker 07/05/11 07/05/2011  . Dyslipidemia 07/05/2011  . Angina at rest, with rapid AF 07/05/2011       Geraldine Solar PT, DPT  [facility] [address] [place], [place], [address] Phone: [phone]   Fax:  [phone]  Name: Abigail Taylor MRN: [id number] Date of Birth: December 31, 1931

## 2017-01-30 DIAGNOSIS — C44311 Basal cell carcinoma of skin of nose: Secondary | ICD-10-CM | POA: Diagnosis not present

## 2017-01-30 DIAGNOSIS — Z08 Encounter for follow-up examination after completed treatment for malignant neoplasm: Secondary | ICD-10-CM | POA: Diagnosis not present

## 2017-01-30 DIAGNOSIS — Z85828 Personal history of other malignant neoplasm of skin: Secondary | ICD-10-CM | POA: Diagnosis not present

## 2017-02-01 ENCOUNTER — Encounter (HOSPITAL_COMMUNITY): Payer: Self-pay | Admitting: Speech Pathology

## 2017-02-01 ENCOUNTER — Other Ambulatory Visit: Payer: Self-pay

## 2017-02-01 ENCOUNTER — Ambulatory Visit (HOSPITAL_COMMUNITY): Payer: Medicare Other

## 2017-02-01 ENCOUNTER — Ambulatory Visit (HOSPITAL_COMMUNITY): Payer: Medicare Other | Admitting: Speech Pathology

## 2017-02-01 ENCOUNTER — Encounter (HOSPITAL_COMMUNITY): Payer: Self-pay

## 2017-02-01 ENCOUNTER — Ambulatory Visit (HOSPITAL_COMMUNITY): Payer: Medicare Other | Admitting: Physical Therapy

## 2017-02-01 DIAGNOSIS — R2681 Unsteadiness on feet: Secondary | ICD-10-CM | POA: Diagnosis not present

## 2017-02-01 DIAGNOSIS — M6281 Muscle weakness (generalized): Secondary | ICD-10-CM | POA: Diagnosis not present

## 2017-02-01 DIAGNOSIS — R293 Abnormal posture: Secondary | ICD-10-CM | POA: Diagnosis not present

## 2017-02-01 DIAGNOSIS — R471 Dysarthria and anarthria: Secondary | ICD-10-CM | POA: Diagnosis not present

## 2017-02-01 DIAGNOSIS — R2689 Other abnormalities of gait and mobility: Secondary | ICD-10-CM | POA: Diagnosis not present

## 2017-02-01 NOTE — Therapy (Signed)
[facility] [address] [place], [place], [address] Phone: [phone]   Fax:  [phone]  Physical Therapy Treatment  Patient Details  Name: Abigail Taylor MRN: [id number] Date of Birth: 02-17-1931 Referring Provider: Alonza Bogus, DO   Encounter Date: 02/01/2017  PT End of Session - 02/01/17 1120    Visit Number  2    Number of Visits  13    Date for PT Re-Evaluation  02/16/17    Authorization Type  Medicare (secondary: generic commercial)    PT Start Time  1120    PT Stop Time  1202    PT Time Calculation (min)  42 min    Equipment Utilized During Treatment  Gait belt    Activity Tolerance  Patient tolerated treatment well    Behavior During Therapy  [facility] for tasks assessed/performed       Past Medical History:  Diagnosis Date  . Angina at rest, with the tachycardia 07/05/2011  . Aortic insufficiency 07/05/2011  . Atrial fibrillation ([place])   . Brady-tachy syndrome ([place])    Dual chamber Medtronic pacemaker Adapta  . Chronic back pain   . Constipation 12/27/2011  . Gait instability   . Hyperlipidemia   . Hypothyroidism   . Normal cardiac stress test 2013  . OA (osteoarthritis)   . Pacemaker   . Parkinson's disease ([place])   . Tremor     Past Surgical History:  Procedure Laterality Date  . ABDOMINAL HYSTERECTOMY    . CARDIAC CATHETERIZATION  05/26/1999   normal  . CARDIAC CATHETERIZATION  2001   normal coronary arteries  . cataract    . COLONOSCOPY N/A 11/15/2012   Procedure: COLONOSCOPY;  Surgeon: [name], MD;  [facility];  Service: Endoscopy;  Laterality: N/A;  1030  . HIP ARTHROPLASTY Left 06/24/2016   Procedure: ARTHROPLASTY BIPOLAR HIP (HEMIARTHROPLASTY);  Surgeon: Altamese Marmarth, MD;  Location: [facility];  Service: Orthopedics;  Laterality: Left;  . INSERT / REPLACE / REMOVE PACEMAKER    . NM MYOVIEW LTD  07/19/2011   normal  . PERMANENT PACEMAKER INSERTION  07/05/2011   Medtronic Adapta dual chamber   . PERMANENT PACEMAKER INSERTION N/A 07/05/2011   Procedure: PERMANENT PACEMAKER INSERTION;  Surgeon: [name], MD;  Location: [facility];  Service: Cardiovascular;  Laterality: N/A;    There were no vitals filed for this visit.  Subjective Assessment - 02/01/17 1210    Subjective  Pt states that she's doing well today. She states that her L hip groin has been bothering her some the last few days.    Limitations  Walking;Standing;House hold activities    How long can you sit comfortably?  no issues    How long can you stand comfortably?  back limits this    How long can you walk comfortably?  2-3 laps around gym at [facility]    Patient Stated Goals  feel better    Currently in Pain?  No/denies         [facility] PT Assessment - 02/01/17 0001      Ambulation/Gait   Ambulation Distance (Feet)  540 Feet    Assistive device  None    Gait Pattern  Step-through pattern;Decreased arm swing - right;Decreased arm swing - left;Right flexed knee in stance;Left flexed knee in stance    Ambulation Surface  Level      Functional Gait  Assessment   Gait assessed   Yes  Gait Level Surface  Walks 20 ft in less than 5.5 sec, no assistive devices, good speed, no evidence for imbalance, normal gait pattern, deviates no more than 6 in outside of the 12 in walkway width.    Change in Gait Speed  Able to smoothly change walking speed without loss of balance or gait deviation. Deviate no more than 6 in outside of the 12 in walkway width.    Gait with Horizontal Head Turns  Performs head turns smoothly with slight change in gait velocity (eg, minor disruption to smooth gait path), deviates 6-10 in outside 12 in walkway width, or uses an assistive device.    Gait with Vertical Head Turns  Performs task with slight change in gait velocity (eg, minor disruption to smooth gait path), deviates 6 - 10 in outside 12 in walkway width or uses assistive device    Gait and Pivot Turn  Pivot turns safely  within 3 sec and stops quickly with no loss of balance.    Step Over Obstacle  Is able to step over 2 stacked shoe boxes taped together (9 in total height) without changing gait speed. No evidence of imbalance.    Gait with Narrow Base of Support  Ambulates 7-9 steps.    Gait with Eyes Closed  Walks 20 ft, slow speed, abnormal gait pattern, evidence for imbalance, deviates 10-15 in outside 12 in walkway width. Requires more than 9 sec to ambulate 20 ft.    Ambulating Backwards  Walks 20 ft, uses assistive device, slower speed, mild gait deviations, deviates 6-10 in outside 12 in walkway width.    Steps  Alternating feet, must use rail.    Total Score  23           [facility] Adult PT Treatment/Exercise - 02/01/17 0001      Exercises   Exercises  Knee/Hip      Knee/Hip Exercises: Standing   Forward Step Up  Both;10 reps;Hand Hold: 0;Step Height: 4"    Gait Training  x2 laps around gym with bil PVC pipe for arm swing; x192ft without PVC pipe demo'ing good carry-over    Other Standing Knee Exercises  side-stepping with RTB 15' x2 RT      Knee/Hip Exercises: Seated   Sit to Sand  10 reps;without UE support RTB around knees         PT Education - 02/01/17 1208    Education provided  Yes    Education Details  arm swing with gait, exercise technique    Person(s) Educated  Patient    Methods  Explanation;Demonstration    Comprehension  Verbalized understanding;Returned demonstration       PT Short Term Goals - 01/26/17 1658      PT SHORT TERM GOAL #1   Title  Pt will be independent with HEP and perform consistently in order to maximize strength and balance.    Time  3    Period  Weeks    Status  New    Target Date  02/16/17      PT SHORT TERM GOAL #2   Title  Pt will score at least 22/24 or > on the DGI to demo improved dynamic balance and decrease her risk for falls.     Time  3    Period  Weeks    Status  New      PT SHORT TERM GOAL #3   Title  Pt will be able to perform  SLS for 30 seconds  on BLE to maximize gait on uneven ground and decrease risk for falls.     Time  3    Period  Weeks    Status  New        PT Long Term Goals - 01/26/17 1700      PT LONG TERM GOAL #1   Title  Pt's MMT will improved to 4+/5 or greater of all muscle groups tested in order to maximize gait, balance, and allow pt to perform functional tasks with greater ease.     Time  6    Period  Weeks    Status  New    Target Date  03/09/17      PT LONG TERM GOAL #2   Title  Pt will be able to perform 5xSTS in 12 sec or < with no UE to demo improved functional strength and overall balance.     Time  6    Period  Weeks    Status  New      PT LONG TERM GOAL #3   Title  Pt will score 25/30 or > on the FGA to demo decreased risk for falling and maximize pt's ability to access her community.     Time  6    Period  Weeks    Status  New      PT LONG TERM GOAL #4   Title  Pt will be able to perform at least 669ft on the 3MWT maintaining 1.18m/s gait speed or > and good arm swing for proper mechanics to demo improved overall gait, strength, and endurance.    Time  6    Period  Weeks    Status  New            Plan - 02/01/17 1202    Clinical Impression Statement  Completed objective testing this date with the 3MWT and FGA. She scored 23/30 on the FGA putting her at a moderate risk for falls. She ambulated 516ft during the 3MWT. Rest of seesion focused on gluteal strengthening and arm swing during gait. She demo'd good carry over with arm swing after using PVC pipes for facilitation. Pt did c/o L hip pain during session so will continue to monitor and address this during future sessions.    Rehab Potential  Fair    PT Frequency  2x / week    PT Duration  6 weeks    PT Treatment/Interventions  ADLs/Self Care Home Management;Cryotherapy;Electrical Stimulation;Moist Heat;Gait training;Stair training;Functional mobility training;Therapeutic activities;Therapeutic exercise;Balance  training;Neuromuscular re-education;Patient/family education;Manual techniques;Passive range of motion;Dry needling;Energy conservation;Taping    PT Next Visit Plan  review goals; practice bed mobility, BLE and functional strengthening, postural strengthening, balance and gait training; can initiate PWR! for improved mobility, posture, and strength    PT Home Exercise Plan  eval: bridging, STS    Consulted and Agree with Plan of Care  Patient       Patient will benefit from skilled therapeutic intervention in order to improve the following deficits and impairments:  Decreased activity tolerance, Decreased balance, Decreased endurance, Decreased mobility, Decreased strength, Difficulty walking, Hypomobility, Increased muscle spasms, Impaired flexibility, Improper body mechanics, Postural dysfunction  Visit Diagnosis: Muscle weakness (generalized)  Unsteadiness on feet  Other abnormalities of gait and mobility  Abnormal posture     Problem List Patient Active Problem List   Diagnosis Date Noted  . RBD (REM behavioral disorder) 01/10/2017  . Neurogenic orthostatic hypotension ([place]) 01/10/2017  . Tachy-brady syndrome ([place]) 06/23/2016  .  Closed subcapital fracture of left femur ([place]) 06/23/2016  . Orthostatic hypotension due to Parkinson's disease ([place]) 06/23/2016  . PD (Parkinson's disease) ([place]) 04/21/2016  . Hypokalemia 04/21/2016  . Normal coronary arteries 2001 05/08/2014  . Pacemaker 08/10/2012  . Aortic insufficiency 08/10/2012  . Constipation 12/27/2011  . Family hx of colon cancer 12/27/2011  . HTN (hypertension) 07/06/2011  . Hypothyroidism 07/05/2011  . Paroxysmal atrial fibrillation ([place]) 07/05/2011  . Sinus pause, 7 seconds post conversion, S/P MDT pacemaker 07/05/11 07/05/2011  . Dyslipidemia 07/05/2011  . Angina at rest, with rapid AF 07/05/2011      Geraldine Solar PT, DPT  [facility] [address] [place],  [place], [address] Phone: [phone]   Fax:  [phone]  Name: AMIYRAH LAMERE MRN: [id number] Date of Birth: 11-18-1931

## 2017-02-01 NOTE — Therapy (Signed)
[facility] [place], [place], [address] Phone: [phone]   Fax:  [phone]  Speech Language Pathology Treatment  Patient Details  Name: Abigail Taylor MRN: [id number] Date of Birth: 1931/03/08 Referring Provider: Alonza Bogus, DO   Encounter Date: 02/01/2017  End of Session - 02/01/17 1122    Visit Number  2    Number of Visits  9    Date for SLP Re-Evaluation  02/28/17    Authorization Type  Generic Commercial Medicare    SLP Start Time  1037    SLP Stop Time   1116    SLP Time Calculation (min)  39 min    Activity Tolerance  Patient tolerated treatment well       Past Medical History:  Diagnosis Date  . Angina at rest, with the tachycardia 07/05/2011  . Aortic insufficiency 07/05/2011  . Atrial fibrillation ([place])   . Brady-tachy syndrome ([place])    Dual chamber Medtronic pacemaker Adapta  . Chronic back pain   . Constipation 12/27/2011  . Gait instability   . Hyperlipidemia   . Hypothyroidism   . Normal cardiac stress test 2013  . OA (osteoarthritis)   . Pacemaker   . Parkinson's disease ([place])   . Tremor     Past Surgical History:  Procedure Laterality Date  . ABDOMINAL HYSTERECTOMY    . CARDIAC CATHETERIZATION  05/26/1999   normal  . CARDIAC CATHETERIZATION  2001   normal coronary arteries  . cataract    . COLONOSCOPY N/A 11/15/2012   Procedure: COLONOSCOPY;  Surgeon: [name], MD;  [facility];  Service: Endoscopy;  Laterality: N/A;  1030  . HIP ARTHROPLASTY Left 06/24/2016   Procedure: ARTHROPLASTY BIPOLAR HIP (HEMIARTHROPLASTY);  Surgeon: Altamese Albin, MD;  Location: [facility];  Service: Orthopedics;  Laterality: Left;  . INSERT / REPLACE / REMOVE PACEMAKER    . NM MYOVIEW LTD  07/19/2011   normal  . PERMANENT PACEMAKER INSERTION  07/05/2011   Medtronic Adapta dual chamber  . PERMANENT PACEMAKER INSERTION N/A 07/05/2011   Procedure: PERMANENT PACEMAKER INSERTION;  Surgeon: [name], MD;  Location: [facility] C[facility] LAB;  Service: Cardiovascular;  Laterality: N/A;    There were no vitals filed for this visit.  Subjective Assessment - 02/01/17 1119    Subjective  "It sounds scratchy today."    Currently in Pain?  No/denies       ADULT SLP TREATMENT - 02/01/17 0001      General Information   Behavior/Cognition  Alert;Cooperative;Pleasant mood    Patient Positioning  Upright in chair    Oral care provided  N/A    HPI  Abigail Taylor is an 84 yo woman who was referred for SLP therapy by Dr. Wells Guiles Tat due to Pt with a diagnosis of Parkinson's disease (~2016). She was receving PT in [place], but requested to have therapy closer to her home. She denies dysphagia. Her primary complaint is difficulty being understood due to hypophonia. She lives at home with her husband. She no longer drives, but remains physically active around the home. She previously sang in the choir at church, but stopped a couple of years ago due to dysphonia. She takes carbidopa/levodopa at 7AM, 11AM, 4 PM, and at bedtime. Pt also reports sialorrhea. Her primary physician is [name], MD.      Treatment Provided   Treatment provided  Cognitive-Linquistic      Pain Assessment   Pain Assessment  No/denies pain      Cognitive-Linquistic Treatment   Treatment focused on  Voice;Dysarthria;Patient/family/caregiver education    Skilled Treatment  Provided overview of focus of therapy; vocal hygiene, and speech intelligibility strategies      Assessment / Recommendations / Plan   Plan  Continue with current plan of care       SLP Education - 02/01/17 1122    Education provided  Yes    Education Details  Provided written speech intelligibility and voice exercises    Person(s) Educated  Patient    Methods  Explanation;Demonstration;Handout;Tactile cues    Comprehension  Verbalized understanding;Need further instruction       SLP Short Term Goals - 02/01/17 1123      SLP SHORT TERM GOAL  #1   Title  The patient will use a strong, clear voice when communicating with family, friends and/or colleagues resulting in an 80% reduction in requests for repetition.    Baseline  Pt reports frequent requests for clarification during conversations    Time  4    Period  Weeks    Status  On-going      SLP SHORT TERM GOAL #2   Title  Regularly practice voice building/strengthening exercises a minimum of 5 days/week for a 20+ minutes a day.    Baseline  Not completing at this time    Time  4    Period  Weeks    Status  On-going      SLP SHORT TERM GOAL #3   Title  TBD       SLP Long Term Goals - 01/26/17 1734      SLP LONG TERM GOAL #1   Title  Same as short       Plan - 02/01/17 1123    Clinical Impression Statement SLP provided written speech intelligibility exercises for Pt and reviewed each one in detail, demonstrating implementation of each. Abigail Taylor receptive and appreciative of feedback and strategies. Audio feedback was utilized in session and Pt provided self evaluation of "some sound better than others" when listening to rote voicing tasks. SLP emphasized breath support and increasing vocal intensity this date. Pt is making good efforts toward goals. Continue POC.    Speech Therapy Frequency  2x / week    Duration  4 weeks    Treatment/Interventions  SLP instruction and feedback;Cueing hierarchy;Compensatory strategies;Compensatory techniques;Patient/family education    Potential to Achieve Goals  Good    Potential Considerations  Severity of impairments    SLP Home Exercise Plan  Pt will complete HEP as assigned to facilitate carryover of treatment strategies and techniques in home environment with use of written cues as needed.    Consulted and Agree with Plan of Care  Patient       Patient will benefit from skilled therapeutic intervention in order to improve the following deficits and impairments:   Dysarthria and anarthria    Problem List Patient Active  Problem List   Diagnosis Date Noted  . RBD (REM behavioral disorder) 01/10/2017  . Neurogenic orthostatic hypotension ([place]) 01/10/2017  . Tachy-brady syndrome ([place]) 06/23/2016  . Closed subcapital fracture of left femur ([place]) 06/23/2016  . Orthostatic hypotension due to Parkinson's disease ([place]) 06/23/2016  . PD (Parkinson's disease) ([place]) 04/21/2016  . Hypokalemia 04/21/2016  . Normal coronary arteries 2001 05/08/2014  . Pacemaker 08/10/2012  . Aortic insufficiency 08/10/2012  . Constipation 12/27/2011  . Family hx of colon cancer 12/27/2011  . HTN (hypertension) 07/06/2011  .  Hypothyroidism 07/05/2011  . Paroxysmal atrial fibrillation ([place]) 07/05/2011  . Sinus pause, 7 seconds post conversion, S/P MDT pacemaker 07/05/11 07/05/2011  . Dyslipidemia 07/05/2011  . Angina at rest, with rapid AF 07/05/2011   Thank you,  Genene Churn, [place]  [facility] 02/01/2017, 11:24 AM  [facility] [address] [place], [place], [address] Phone: [phone]   Fax:  [phone]   Name: Abigail Taylor MRN: [id number] Date of Birth: 1931-12-17

## 2017-02-02 ENCOUNTER — Ambulatory Visit (INDEPENDENT_AMBULATORY_CARE_PROVIDER_SITE_OTHER): Payer: Medicare Other | Admitting: *Deleted

## 2017-02-02 ENCOUNTER — Telehealth: Payer: Self-pay | Admitting: Cardiology

## 2017-02-02 DIAGNOSIS — I495 Sick sinus syndrome: Secondary | ICD-10-CM

## 2017-02-02 NOTE — Telephone Encounter (Signed)
Spoke with pt and reminded pt of remote transmission that is due today. Pt verbalized understanding.   

## 2017-02-02 NOTE — Progress Notes (Signed)
Remote pacemaker transmission.   

## 2017-02-03 ENCOUNTER — Ambulatory Visit (HOSPITAL_COMMUNITY): Payer: Medicare Other | Admitting: Physical Therapy

## 2017-02-06 ENCOUNTER — Ambulatory Visit (HOSPITAL_COMMUNITY): Payer: Medicare Other | Admitting: Speech Pathology

## 2017-02-06 ENCOUNTER — Encounter (HOSPITAL_COMMUNITY): Payer: Self-pay

## 2017-02-06 ENCOUNTER — Encounter (HOSPITAL_COMMUNITY): Payer: Self-pay | Admitting: Speech Pathology

## 2017-02-06 ENCOUNTER — Ambulatory Visit (HOSPITAL_COMMUNITY): Payer: Medicare Other

## 2017-02-06 ENCOUNTER — Other Ambulatory Visit: Payer: Self-pay

## 2017-02-06 DIAGNOSIS — R293 Abnormal posture: Secondary | ICD-10-CM | POA: Diagnosis not present

## 2017-02-06 DIAGNOSIS — R2689 Other abnormalities of gait and mobility: Secondary | ICD-10-CM

## 2017-02-06 DIAGNOSIS — M6281 Muscle weakness (generalized): Secondary | ICD-10-CM

## 2017-02-06 DIAGNOSIS — R2681 Unsteadiness on feet: Secondary | ICD-10-CM

## 2017-02-06 DIAGNOSIS — R471 Dysarthria and anarthria: Secondary | ICD-10-CM | POA: Diagnosis not present

## 2017-02-06 NOTE — Therapy (Signed)
[facility] [place], [place], [address] Phone: [phone]   Fax:  [phone]  Speech Language Pathology Treatment  Patient Details  Name: Abigail Taylor MRN: [id number] Date of Birth: January 23, 1932 Referring Provider: Alonza Bogus, DO   Encounter Date: 02/06/2017  End of Session - 02/06/17 1704    Visit Number  3    Number of Visits  9    Date for SLP Re-Evaluation  02/28/17    Authorization Type  Generic Commercial Medicare    SLP Start Time  1027    SLP Stop Time   1612    SLP Time Calculation (min)  47 min    Activity Tolerance  Patient tolerated treatment well       Past Medical History:  Diagnosis Date  . Angina at rest, with the tachycardia 07/05/2011  . Aortic insufficiency 07/05/2011  . Atrial fibrillation ([place])   . Brady-tachy syndrome ([place])    Dual chamber Medtronic pacemaker Adapta  . Chronic back pain   . Constipation 12/27/2011  . Gait instability   . Hyperlipidemia   . Hypothyroidism   . Normal cardiac stress test 2013  . OA (osteoarthritis)   . Pacemaker   . Parkinson's disease ([place])   . Tremor     Past Surgical History:  Procedure Laterality Date  . ABDOMINAL HYSTERECTOMY    . CARDIAC CATHETERIZATION  05/26/1999   normal  . CARDIAC CATHETERIZATION  2001   normal coronary arteries  . cataract    . COLONOSCOPY N/A 11/15/2012   Procedure: COLONOSCOPY;  Surgeon: [name], MD;  [facility];  Service: Endoscopy;  Laterality: N/A;  1030  . HIP ARTHROPLASTY Left 06/24/2016   Procedure: ARTHROPLASTY BIPOLAR HIP (HEMIARTHROPLASTY);  Surgeon: Altamese , MD;  Location: [facility];  Service: Orthopedics;  Laterality: Left;  . INSERT / REPLACE / REMOVE PACEMAKER    . NM MYOVIEW LTD  07/19/2011   normal  . PERMANENT PACEMAKER INSERTION  07/05/2011   Medtronic Adapta dual chamber  . PERMANENT PACEMAKER INSERTION N/A 07/05/2011   Procedure: PERMANENT PACEMAKER INSERTION;  Surgeon: [name], MD;  Location: [facility] CATH LAB;  Service: Cardiovascular;  Laterality: N/A;    There were no vitals filed for this visit.  Subjective Assessment - 02/06/17 1700    Subjective  "It sounds better when I repeat after you."    Currently in Pain?  No/denies        ADULT SLP TREATMENT - 02/06/17 0001      General Information   Behavior/Cognition  Alert;Cooperative;Pleasant mood    Patient Positioning  Upright in chair    Oral care provided  N/A    HPI  Abigail Taylor is an 84 yo woman who was referred for SLP therapy by Dr. Wells Guiles Tat due to Pt with a diagnosis of Parkinson's disease (~2016). She was receving PT in [place], but requested to have therapy closer to her home. She denies dysphagia. Her primary complaint is difficulty being understood due to hypophonia. She lives at home with her husband. She no longer drives, but remains physically active around the home. She previously sang in the choir at church, but stopped a couple of years ago due to dysphonia. She takes carbidopa/levodopa at 7AM, 11AM, 4 PM, and at bedtime. Pt also reports sialorrhea. Her primary physician is [name], MD.      Treatment Provided   Treatment provided  Cognitive-Linquistic      Pain Assessment  Pain Assessment  No/denies pain      Cognitive-Linquistic Treatment   Treatment focused on  Voice;Dysarthria;Patient/family/caregiver education    Skilled Treatment  Reviewed overview of focus of therapy; vocal hygiene, and speech intelligibility strategies; /a/ adduction drills, tape recorded Pt for feedback and interpretation      Assessment / Recommendations / [place] with current plan of care       SLP Education - 02/06/17 1703    Education provided  Yes    Education Details  Provided list of functional phrases for Pt to practice reading aloud for homework    Person(s) Educated  Patient    Methods  Explanation;Demonstration;Handout;Verbal cues    Comprehension  Verbalized  understanding;Verbal cues required;Need further instruction       SLP Short Term Goals - 02/06/17 1710      SLP SHORT TERM GOAL #1   Title  The patient will use a strong, clear voice when communicating with family, friends and/or colleagues resulting in an 80% reduction in requests for repetition.    Baseline  Pt reports frequent requests for clarification during conversations    Time  4    Period  Weeks    Status  On-going      SLP SHORT TERM GOAL #2   Title  Regularly practice voice building/strengthening exercises a minimum of 5 days/week for a 20+ minutes a day.    Baseline  Not completing at this time    Time  4    Period  Weeks    Status  On-going      SLP SHORT TERM GOAL #3   Title  TBD       SLP Long Term Goals - 01/26/17 1734      SLP LONG TERM GOAL #1   Title  Same as short       Plan - 02/06/17 1704    Clinical Impression Statement  Pt alert and engaged in treatment tasks this date. She reports focusing on increasing vocal intensity and breath support at home. She went to a church dinner and reported that she felt that people could not hear her and she was given feedback from a friend to "speak louder". Pt able to sustain /a/ from 10-24 seconds today over five trials. Pt noted to sustain /a/ with strong vocal intensity for 10 seconds before producing glottal fry for the remainder 10+ seconds over several trials. Pt attempted with both pusing and pulling with her hands to increase adduction of vocal folds. Audio recording utilized to provide Pt feedback regarding vocal intensity. Pt benefited from repeating sentences immediately after SLP and verbal cues for breath support and decreasing rate. Pt also encouraged to swallow more frequently. Continue plan of care.     Speech Therapy Frequency  2x / week    Duration  4 weeks    Treatment/Interventions  SLP instruction and feedback;Cueing hierarchy;Compensatory strategies;Compensatory techniques;Patient/family education     Potential to Achieve Goals  Good    Potential Considerations  Severity of impairments    SLP Home Exercise Plan  Pt will complete HEP as assigned to facilitate carryover of treatment strategies and techniques in home environment with use of written cues as needed.    Consulted and Agree with Plan of Care  Patient       Patient will benefit from skilled therapeutic intervention in order to improve the following deficits and impairments:   Dysarthria and anarthria    Problem List Patient Active  Problem List   Diagnosis Date Noted  . RBD (REM behavioral disorder) 01/10/2017  . Neurogenic orthostatic hypotension ([place]) 01/10/2017  . Tachy-brady syndrome ([place]) 06/23/2016  . Closed subcapital fracture of left femur ([place]) 06/23/2016  . Orthostatic hypotension due to Parkinson's disease ([place]) 06/23/2016  . PD (Parkinson's disease) ([place]) 04/21/2016  . Hypokalemia 04/21/2016  . Normal coronary arteries 2001 05/08/2014  . Pacemaker 08/10/2012  . Aortic insufficiency 08/10/2012  . Constipation 12/27/2011  . Family hx of colon cancer 12/27/2011  . HTN (hypertension) 07/06/2011  . Hypothyroidism 07/05/2011  . Paroxysmal atrial fibrillation ([place]) 07/05/2011  . Sinus pause, 7 seconds post conversion, S/P MDT pacemaker 07/05/11 07/05/2011  . Dyslipidemia 07/05/2011  . Angina at rest, with rapid AF 07/05/2011   Thank you,  Genene Churn, [place]  [facility] 02/06/2017, 5:11 PM  [place], [place], [address] Phone: [phone]   Fax:  [phone]   Name: Abigail Taylor MRN: [id number] Date of Birth: January 06, 1932

## 2017-02-06 NOTE — Therapy (Signed)
[facility] [address] [place], [place], [address] Phone: [phone]   Fax:  [phone]  Physical Therapy Treatment  Patient Details  Name: Abigail Taylor MRN: [id number] Date of Birth: 07-13-1931 Referring Provider: Alonza Bogus, DO   Encounter Date: 02/06/2017  PT End of Session - 02/06/17 1628    Visit Number  3    Number of Visits  13    Date for PT Re-Evaluation  02/16/17    Authorization Type  Medicare (secondary: generic commercial)    PT Start Time  1433    PT Stop Time  1517    PT Time Calculation (min)  44 min    Equipment Utilized During Treatment  Gait belt    Activity Tolerance  Patient tolerated treatment well    Behavior During Therapy  [facility] for tasks assessed/performed       Past Medical History:  Diagnosis Date  . Angina at rest, with the tachycardia 07/05/2011  . Aortic insufficiency 07/05/2011  . Atrial fibrillation ([place])   . Brady-tachy syndrome ([place])    Dual chamber Medtronic pacemaker Adapta  . Chronic back pain   . Constipation 12/27/2011  . Gait instability   . Hyperlipidemia   . Hypothyroidism   . Normal cardiac stress test 2013  . OA (osteoarthritis)   . Pacemaker   . Parkinson's disease ([place])   . Tremor     Past Surgical History:  Procedure Laterality Date  . ABDOMINAL HYSTERECTOMY    . CARDIAC CATHETERIZATION  05/26/1999   normal  . CARDIAC CATHETERIZATION  2001   normal coronary arteries  . cataract    . COLONOSCOPY N/A 11/15/2012   Procedure: COLONOSCOPY;  Surgeon: [name], MD;  [facility];  Service: Endoscopy;  Laterality: N/A;  1030  . HIP ARTHROPLASTY Left 06/24/2016   Procedure: ARTHROPLASTY BIPOLAR HIP (HEMIARTHROPLASTY);  Surgeon: Altamese Montz, MD;  Location: [facility];  Service: Orthopedics;  Laterality: Left;  . INSERT / REPLACE / REMOVE PACEMAKER    . NM MYOVIEW LTD  07/19/2011   normal  . PERMANENT PACEMAKER INSERTION  07/05/2011   Medtronic Adapta dual  chamber  . PERMANENT PACEMAKER INSERTION N/A 07/05/2011   Procedure: PERMANENT PACEMAKER INSERTION;  Surgeon: [name], MD;  Location: [facility] CATH LAB;  Service: Cardiovascular;  Laterality: N/A;    There were no vitals filed for this visit.  Subjective Assessment - 02/06/17 1623    Subjective  Patient states she is doing well today but that she had a difficult day yesterday. She reports her left groin pain had been bothering her a lot yesterday.    Limitations  Walking;Standing;House hold activities    How long can you sit comfortably?  no issues    How long can you stand comfortably?  back limits this    How long can you walk comfortably?  2-3 laps around [facility]    Patient Stated Goals  feel better    Currently in Pain?  No/denies       [facility] Adult PT Treatment/Exercise - 02/06/17 0001      Neuro Re-ed    Neuro Re-ed Details   Gait trainer for 6 minutes, 2 hand support, patient achieved ~ 40% of time (step length goal 53 cm, velocity goal 1.7-3.4 mph) increase gait velocity next session      Knee/Hip Exercises: Standing   Other Standing Knee Exercises  side-stepping with RTB 15' x2 RT  Knee/Hip Exercises: Seated   Sit to Sand  10 reps;without UE support;2 sets        P[facility]) - 02/06/17 1634    PWR! Up  2 sets x 10 reps    PWR! Twist  10 reps bilaterally    PWR! Multi-Directional Movements with Dual Tasks  Patient requires demonstration, tactile cues, and verbal cues to sequence movements. She is limited by balacne deficits and will benefit from seated and standign PWR! moves.    PWR! Up  10 reps    PWR! Rock  10 reps bilaterally    PWR! Twist  10 reps bilaterally       PT Education - 02/06/17 1627    Education provided  Yes    Education Details  educated on repetition for improvements. Educated on [employer]! exercises and will provide handout next session.     Person(s) Educated  Patient    Methods  Explanation    Comprehension  Verbalized  understanding;Need further instruction       PT Short Term Goals - 01/26/17 1658      PT SHORT TERM GOAL #1   Title  Pt will be independent with HEP and perform consistently in order to maximize strength and balance.    Time  3    Period  Weeks    Status  New    Target Date  02/16/17      PT SHORT TERM GOAL #2   Title  Pt will score at least 22/24 or > on the DGI to demo improved dynamic balance and decrease her risk for falls.     Time  3    Period  Weeks    Status  New      PT SHORT TERM GOAL #3   Title  Pt will be able to perform SLS for 30 seconds on BLE to maximize gait on uneven ground and decrease risk for falls.     Time  3    Period  Weeks    Status  New        PT Long Term Goals - 01/26/17 1700      PT LONG TERM GOAL #1   Title  Pt's MMT will improved to 4+/5 or greater of all muscle groups tested in order to maximize gait, balance, and allow pt to perform functional tasks with greater ease.     Time  6    Period  Weeks    Status  New    Target Date  03/09/17      PT LONG TERM GOAL #2   Title  Pt will be able to perform 5xSTS in 12 sec or < with no UE to demo improved functional strength and overall balance.     Time  6    Period  Weeks    Status  New      PT LONG TERM GOAL #3   Title  Pt will score 25/30 or > on the FGA to demo decreased risk for falling and maximize pt's ability to access her community.     Time  6    Period  Weeks    Status  New      PT LONG TERM GOAL #4   Title  Pt will be able to perform at least 674ft on the 3MWT maintaining 1.48m/s gait speed or > and good arm swing for proper mechanics to demo improved overall gait, strength, and endurance.    Time  6  Period  Weeks    Status  New        Plan - 02/06/17 1629    Clinical Impression Statement  Patient is progressing in therapy and continued with functional lower extremity strengthening. Patient had good carryover from previous session with gait, demonstrating upright posture  and verbalizing conscious effort to perform arm swing with contralateral steps. She initiated PWR! Moves today for therapeutic exercise and reported feeling a good stretch. She had difficulty coordinating movements with UE/LE together and remains limited by proximal hip weakness. She will continue to benefit from skilled PT services to improve functional mobility and QOL.    Rehab Potential  Fair    PT Frequency  2x / week    PT Duration  6 weeks    PT Treatment/Interventions  ADLs/Self Care Home Management;Cryotherapy;Electrical Stimulation;Moist Heat;Gait training;Stair training;Functional mobility training;Therapeutic activities;Therapeutic exercise;Balance training;Neuromuscular re-education;Patient/family education;Manual techniques;Passive range of motion;Dry needling;Energy conservation;Taping    PT Next Visit Plan  Continue with gait trainer and PWR! moves in sitting and standing. Provide PWR handout for HEP next session. Practice bed mobility, BLE and functional strengthening, postural strengthening, balance and gait training; can initiate PWR! for improved mobility, posture, and strength    PT Home Exercise Plan  eval: bridging, STS    Consulted and Agree with Plan of Care  Patient       Patient will benefit from skilled therapeutic intervention in order to improve the following deficits and impairments:  Decreased activity tolerance, Decreased balance, Decreased endurance, Decreased mobility, Decreased strength, Difficulty walking, Hypomobility, Increased muscle spasms, Impaired flexibility, Improper body mechanics, Postural dysfunction  Visit Diagnosis: Muscle weakness (generalized)  Unsteadiness on feet  Other abnormalities of gait and mobility  Abnormal posture     Problem List Patient Active Problem List   Diagnosis Date Noted  . RBD (REM behavioral disorder) 01/10/2017  . Neurogenic orthostatic hypotension ([place]) 01/10/2017  . Tachy-brady syndrome ([place]) 06/23/2016  .  Closed subcapital fracture of left femur ([place]) 06/23/2016  . Orthostatic hypotension due to Parkinson's disease ([place]) 06/23/2016  . PD (Parkinson's disease) ([place]) 04/21/2016  . Hypokalemia 04/21/2016  . Normal coronary arteries 2001 05/08/2014  . Pacemaker 08/10/2012  . Aortic insufficiency 08/10/2012  . Constipation 12/27/2011  . Family hx of colon cancer 12/27/2011  . HTN (hypertension) 07/06/2011  . Hypothyroidism 07/05/2011  . Paroxysmal atrial fibrillation ([place]) 07/05/2011  . Sinus pause, 7 seconds post conversion, S/P MDT pacemaker 07/05/11 07/05/2011  . Dyslipidemia 07/05/2011  . Angina at rest, with rapid AF 07/05/2011    Kipp Brood, PT, DPT Physical Therapist with [facility]  02/06/2017 4:36 PM     [facility] [place], [place], [address] Phone: [phone]   Fax:  [phone]  Name: MIYOKO HASHIMI MRN: [id number] Date of Birth: [birth year]

## 2017-02-07 ENCOUNTER — Ambulatory Visit (HOSPITAL_COMMUNITY): Payer: Medicare Other | Admitting: Physical Therapy

## 2017-02-07 DIAGNOSIS — I739 Peripheral vascular disease, unspecified: Secondary | ICD-10-CM | POA: Diagnosis not present

## 2017-02-08 ENCOUNTER — Encounter (HOSPITAL_COMMUNITY): Payer: Self-pay | Admitting: Speech Pathology

## 2017-02-08 ENCOUNTER — Encounter (HOSPITAL_COMMUNITY): Payer: Self-pay

## 2017-02-08 ENCOUNTER — Ambulatory Visit (HOSPITAL_COMMUNITY): Payer: Medicare Other

## 2017-02-08 ENCOUNTER — Ambulatory Visit (HOSPITAL_COMMUNITY): Payer: Medicare Other | Admitting: Speech Pathology

## 2017-02-08 DIAGNOSIS — M6281 Muscle weakness (generalized): Secondary | ICD-10-CM

## 2017-02-08 DIAGNOSIS — R2689 Other abnormalities of gait and mobility: Secondary | ICD-10-CM

## 2017-02-08 DIAGNOSIS — R293 Abnormal posture: Secondary | ICD-10-CM

## 2017-02-08 DIAGNOSIS — R2681 Unsteadiness on feet: Secondary | ICD-10-CM | POA: Diagnosis not present

## 2017-02-08 DIAGNOSIS — R471 Dysarthria and anarthria: Secondary | ICD-10-CM

## 2017-02-08 NOTE — Patient Instructions (Signed)
  HAMSTRING STRETCH WITH TOWEL  While lying down on your back, hook a towel or strap under  your foot and draw up your leg until a stretch is felt under your leg. calf area.  Keep your knee in a straightened position during the stretch.  Perform 1x/day (or more if it helps), before you get out of bed, 3-5 stretches holding for 30-60 seconds

## 2017-02-08 NOTE — Therapy (Signed)
[facility] [place], [place], [address] Phone: [phone]   Fax:  [phone]  Speech Language Pathology Treatment  Patient Details  Name: Abigail Taylor MRN: [id number] Date of Birth: [birth year] Referring Provider: Alonza Bogus, DO   Encounter Date: 02/08/2017  End of Session - 02/08/17 1220    Visit Number  4    Number of Visits  9    Date for SLP Re-Evaluation  02/28/17    Authorization Type  Generic Commercial Medicare    SLP Start Time  1115    SLP Stop Time   1200    SLP Time Calculation (min)  45 min    Activity Tolerance  Patient tolerated treatment well       Past Medical History:  Diagnosis Date  . Angina at rest, with the tachycardia 07/05/2011  . Aortic insufficiency 07/05/2011  . Atrial fibrillation ([place])   . Brady-tachy syndrome ([place])    Dual chamber Medtronic pacemaker Adapta  . Chronic back pain   . Constipation 12/27/2011  . Gait instability   . Hyperlipidemia   . Hypothyroidism   . Normal cardiac stress test 2013  . OA (osteoarthritis)   . Pacemaker   . Parkinson's disease ([place])   . Tremor     Past Surgical History:  Procedure Laterality Date  . ABDOMINAL HYSTERECTOMY    . CARDIAC CATHETERIZATION  05/26/1999   normal  . CARDIAC CATHETERIZATION  2001   normal coronary arteries  . cataract    . COLONOSCOPY N/A 11/15/2012   Procedure: COLONOSCOPY;  Surgeon: [name], MD;  [facility];  Service: Endoscopy;  Laterality: N/A;  1030  . HIP ARTHROPLASTY Left 06/24/2016   Procedure: ARTHROPLASTY BIPOLAR HIP (HEMIARTHROPLASTY);  Surgeon: Altamese Dungannon, MD;  Location: [facility];  Service: Orthopedics;  Laterality: Left;  . INSERT / REPLACE / REMOVE PACEMAKER    . NM MYOVIEW LTD  07/19/2011   normal  . PERMANENT PACEMAKER INSERTION  07/05/2011   Medtronic Adapta dual chamber  . PERMANENT PACEMAKER INSERTION N/A 07/05/2011   Procedure: PERMANENT PACEMAKER INSERTION;  Surgeon: [name], MD;  Location: [facility] CATH LAB;  Service: Cardiovascular;  Laterality: N/A;    There were no vitals filed for this visit.  Subjective Assessment - 02/08/17 1217    Subjective  "I've been practicing at home."    Currently in Pain?  No/denies       ADULT SLP TREATMENT - 02/08/17 0001      General Information   Behavior/Cognition  Alert;Cooperative;Pleasant mood    Patient Positioning  Upright in chair    Oral care provided  N/A    HPI  Abigail Taylor is an 84 yo woman who was referred for SLP therapy by Dr. Wells Guiles Tat due to Pt with a diagnosis of Parkinson's disease (~2016). She was receving PT in [place], but requested to have therapy closer to her home. She denies dysphagia. Her primary complaint is difficulty being understood due to hypophonia. She lives at home with her husband. She no longer drives, but remains physically active around the home. She previously sang in the choir at church, but stopped a couple of years ago due to dysphonia. She takes carbidopa/levodopa at 7AM, 11AM, 4 PM, and at bedtime. Pt also reports sialorrhea. Her primary physician is [name], MD.      Treatment Provided   Treatment provided  Cognitive-Linquistic      Pain Assessment   Pain Assessment  No/denies pain      Cognitive-Linquistic Treatment   Treatment focused on  Voice;Dysarthria;Patient/family/caregiver education    Skilled Treatment  vocal hygiene, and speech intelligibility strategies; /a/ adduction drills, tape recorded Pt for feedback and interpretation, functional phrases      Assessment / Recommendations / [place] with current plan of care       SLP Education - 02/08/17 1220    Education provided  Yes    Education Details  updated HEP to include vocal warm up    Person(s) Educated  Patient    Methods  Explanation;Demonstration;Handout    Comprehension  Verbalized understanding;Returned demonstration       SLP Short Term Goals - 02/08/17 1227       SLP SHORT TERM GOAL #1   Title  The patient will use a strong, clear voice when communicating with family, friends and/or colleagues resulting in an 80% reduction in requests for repetition.    Baseline  Pt reports frequent requests for clarification during conversations    Time  4    Period  Weeks    Status  On-going      SLP SHORT TERM GOAL #2   Title  Regularly practice voice building/strengthening exercises a minimum of 5 days/week for a 20+ minutes a day.    Baseline  Not completing at this time    Time  4    Period  Weeks    Status  On-going      SLP SHORT TERM GOAL #3   Title  TBD       SLP Long Term Goals - 01/26/17 1734      SLP LONG TERM GOAL #1   Title  Same as short       Plan - 02/08/17 1221    Clinical Impression Statement  Pt alert and engaged in therapy. She reports practicing breathing/voicing exercises at home with her husband. She also indicates that she feels like her speech "is a little better". SLP led Pt through vocal warm up exercises with cues on appropriate breath support. Pt sustained /a/ for 11-23 seconds with an average of 19 seconds over 7 trials. Decreased glottal fry noted today and Pt with improved ability to generate more resonant /a/ for up to 12 seconds before decreasing in intensity. Pt required less cueing for breath support during oral reading of functional sentences today. HEP adjusted. Continue with plan of care.     Speech Therapy Frequency  2x / week    Duration  4 weeks    Treatment/Interventions  SLP instruction and feedback;Cueing hierarchy;Compensatory strategies;Compensatory techniques;Patient/family education    Potential to Achieve Goals  Good    Potential Considerations  Severity of impairments    SLP Home Exercise Plan  Pt will complete HEP as assigned to facilitate carryover of treatment strategies and techniques in home environment with use of written cues as needed.    Consulted and Agree with Plan of Care  Patient        Patient will benefit from skilled therapeutic intervention in order to improve the following deficits and impairments:   Dysarthria and anarthria    Problem List Patient Active Problem List   Diagnosis Date Noted  . RBD (REM behavioral disorder) 01/10/2017  . Neurogenic orthostatic hypotension ([place]) 01/10/2017  . Tachy-brady syndrome ([place]) 06/23/2016  . Closed subcapital fracture of left femur ([place]) 06/23/2016  . Orthostatic hypotension due to Parkinson's disease ([place]) 06/23/2016  . PD (Parkinson's disease) ([place])  04/21/2016  . Hypokalemia 04/21/2016  . Normal coronary arteries 2001 05/08/2014  . Pacemaker 08/10/2012  . Aortic insufficiency 08/10/2012  . Constipation 12/27/2011  . Family hx of colon cancer 12/27/2011  . HTN (hypertension) 07/06/2011  . Hypothyroidism 07/05/2011  . Paroxysmal atrial fibrillation ([place]) 07/05/2011  . Sinus pause, 7 seconds post conversion, S/P MDT pacemaker 07/05/11 07/05/2011  . Dyslipidemia 07/05/2011  . Angina at rest, with rapid AF 07/05/2011   Thank you,  Genene Churn, [place]  [facility] 02/08/2017, 12:28 PM  [facility] [address] [place], [place], [address] Phone: [phone]   Fax:  [phone]   Name: Abigail Taylor MRN: [id number] Date of Birth: May 22, 1931

## 2017-02-08 NOTE — Therapy (Signed)
[facility] [address] [place], [place], [address] Phone: [phone]   Fax:  [phone]  Physical Therapy Treatment  Patient Details  Name: Abigail Taylor MRN: [id number] Date of Birth: October 08, 1931 Referring Provider: Alonza Bogus, DO   Encounter Date: 02/08/2017  PT End of Session - 02/08/17 1034    Visit Number  4    Number of Visits  13    Date for PT Re-Evaluation  02/16/17    Authorization Type  Medicare (secondary: generic commercial)    PT Start Time  1030    PT Stop Time  1110    PT Time Calculation (min)  40 min    Equipment Utilized During Treatment  Gait belt    Activity Tolerance  Patient tolerated treatment well    Behavior During Therapy  [facility] for tasks assessed/performed       Past Medical History:  Diagnosis Date  . Angina at rest, with the tachycardia 07/05/2011  . Aortic insufficiency 07/05/2011  . Atrial fibrillation ([place])   . Brady-tachy syndrome ([place])    Dual chamber Medtronic pacemaker Adapta  . Chronic back pain   . Constipation 12/27/2011  . Gait instability   . Hyperlipidemia   . Hypothyroidism   . Normal cardiac stress test 2013  . OA (osteoarthritis)   . Pacemaker   . Parkinson's disease ([place])   . Tremor     Past Surgical History:  Procedure Laterality Date  . ABDOMINAL HYSTERECTOMY    . CARDIAC CATHETERIZATION  05/26/1999   normal  . CARDIAC CATHETERIZATION  2001   normal coronary arteries  . cataract    . COLONOSCOPY N/A 11/15/2012   Procedure: COLONOSCOPY;  Surgeon: [name], MD;  [facility];  Service: Endoscopy;  Laterality: N/A;  1030  . HIP ARTHROPLASTY Left 06/24/2016   Procedure: ARTHROPLASTY BIPOLAR HIP (HEMIARTHROPLASTY);  Surgeon: Altamese Anegam, MD;  Location: [place];  Service: Orthopedics;  Laterality: Left;  . INSERT / REPLACE / REMOVE PACEMAKER    . NM MYOVIEW LTD  07/19/2011   normal  . PERMANENT PACEMAKER INSERTION  07/05/2011   Medtronic Adapta dual  chamber  . PERMANENT PACEMAKER INSERTION N/A 07/05/2011   Procedure: PERMANENT PACEMAKER INSERTION;  Surgeon: [name], MD;  Location: [facility];  Service: Cardiovascular;  Laterality: N/A;    There were no vitals filed for this visit.  Subjective Assessment - 02/08/17 1035    Subjective  Pt states that her L hip is still bothering her. She said that it's worse when she walks a lot.    Limitations  Walking;Standing;House hold activities    How long can you sit comfortably?  no issues    How long can you stand comfortably?  back limits this    How long can you walk comfortably?  2-3 laps [facility]    Patient Stated Goals  feel better    Currently in Pain?  No/denies             [facility] Adult PT Treatment/Exercise - 02/08/17 0001      Knee/Hip Exercises: Stretches   Passive Hamstring Stretch  Left;3 reps;30 seconds    Passive Hamstring Stretch Limitations  supine with rope      Knee/Hip Exercises: Standing   Gait Training  x3 laps around gym with metronome at 120BPM    Other Standing Knee Exercises  side-stepping with GTB 15' x2 RT      Knee/Hip  Exercises: Seated   Sit to Sand  15 reps;without UE support + OH press      PWR [facility]) - 02/08/17 1045    PWR! Up  x15 reps    PWR! Twist  x15 bil    PWR! Up  x15    PWR! Rock  x15 bil    PWR! Twist  x15 bil             PT Education - 02/08/17 1034    Education provided  Yes    Education Details  updated HEP    Person(s) Educated  Patient    Methods  Explanation;Demonstration;Handout    Comprehension  Verbalized understanding;Returned demonstration       PT Short Term Goals - 01/26/17 1658      PT SHORT TERM GOAL #1   Title  Pt will be independent with HEP and perform consistently in order to maximize strength and balance.    Time  3    Period  Weeks    Status  New    Target Date  02/16/17      PT SHORT TERM GOAL #2   Title  Pt will score at least 22/24 or > on the DGI to demo  improved dynamic balance and decrease her risk for falls.     Time  3    Period  Weeks    Status  New      PT SHORT TERM GOAL #3   Title  Pt will be able to perform SLS for 30 seconds on BLE to maximize gait on uneven ground and decrease risk for falls.     Time  3    Period  Weeks    Status  New        PT Long Term Goals - 01/26/17 1700      PT LONG TERM GOAL #1   Title  Pt's MMT will improved to 4+/5 or greater of all muscle groups tested in order to maximize gait, balance, and allow pt to perform functional tasks with greater ease.     Time  6    Period  Weeks    Status  New    Target Date  03/09/17      PT LONG TERM GOAL #2   Title  Pt will be able to perform 5xSTS in 12 sec or < with no UE to demo improved functional strength and overall balance.     Time  6    Period  Weeks    Status  New      PT LONG TERM GOAL #3   Title  Pt will score 25/30 or > on the FGA to demo decreased risk for falling and maximize pt's ability to access her community.     Time  6    Period  Weeks    Status  New      PT LONG TERM GOAL #4   Title  Pt will be able to perform at least 681ft on the 3MWT maintaining 1.68m/s gait speed or > and good arm swing for proper mechanics to demo improved overall gait, strength, and endurance.    Time  6    Period  Weeks    Status  New            Plan - 02/08/17 1112    Clinical Impression Statement  Pt presents to therapy with continued reports of L hip pain/pain in her L groin. PT assessed pt's L hip as  she states it is affecting her walking and she was noted to have increased restrictions of adductors and HS. She stated that assessment of her HS recreated her pain so provided pt with HS stretch in order to decrease her pain and improve flexibility in order to maximize her ambulation and decrease risk for falls. Rest of session focused on gluteal strengthening, PWR!, and gait training. Provided pt with sitting PWR!Moves handout. Perormed gait training  around gym this date with metronome set at 120BPM; she required min cues for arm swing and step sequencing to the metronome beat, but otherwise, this is improving. Pt stated her L hip felt better at [facility]. Continue as planned.    Rehab Potential  Fair    PT Frequency  2x / week    PT Duration  6 weeks    PT Treatment/Interventions  ADLs/Self Care Home Management;Cryotherapy;Electrical Stimulation;Moist Heat;Gait training;Stair training;Functional mobility training;Therapeutic activities;Therapeutic exercise;Balance training;Neuromuscular re-education;Patient/family education;Manual techniques;Passive range of motion;Dry needling;Energy conservation;Taping    PT Next Visit Plan  Continue with gait trainer and PWR! moves in sitting and standing. Practice bed mobility, BLE and functional strengthening, postural strengthening, balance    PT Home Exercise Plan  eval: bridging, STS; 1/16: HS stretch in supine, PWR! Sitting    Consulted and Agree with Plan of Care  Patient       Patient will benefit from skilled therapeutic intervention in order to improve the following deficits and impairments:  Decreased activity tolerance, Decreased balance, Decreased endurance, Decreased mobility, Decreased strength, Difficulty walking, Hypomobility, Increased muscle spasms, Impaired flexibility, Improper body mechanics, Postural dysfunction  Visit Diagnosis: Muscle weakness (generalized)  Unsteadiness on feet  Other abnormalities of gait and mobility  Abnormal posture     Problem List Patient Active Problem List   Diagnosis Date Noted  . RBD (REM behavioral disorder) 01/10/2017  . Neurogenic orthostatic hypotension ([place]) 01/10/2017  . Tachy-brady syndrome ([place]) 06/23/2016  . Closed subcapital fracture of left femur ([place]) 06/23/2016  . Orthostatic hypotension due to Parkinson's disease ([place]) 06/23/2016  . PD (Parkinson's disease) ([place]) 04/21/2016  . Hypokalemia 04/21/2016  . Normal coronary arteries  2001 05/08/2014  . Pacemaker 08/10/2012  . Aortic insufficiency 08/10/2012  . Constipation 12/27/2011  . Family hx of colon cancer 12/27/2011  . HTN (hypertension) 07/06/2011  . Hypothyroidism 07/05/2011  . Paroxysmal atrial fibrillation ([place]) 07/05/2011  . Sinus pause, 7 seconds post conversion, S/P MDT pacemaker 07/05/11 07/05/2011  . Dyslipidemia 07/05/2011  . Angina at rest, with rapid AF 07/05/2011       Geraldine Solar PT, DPT   [facility] [address] [place], [place], [address] Phone: [phone]   Fax:  [phone]  Name: Abigail Taylor MRN: [id number] Date of Birth: Sep 22, 1931

## 2017-02-09 ENCOUNTER — Ambulatory Visit (HOSPITAL_COMMUNITY): Payer: Medicare Other | Admitting: Physical Therapy

## 2017-02-10 ENCOUNTER — Encounter: Payer: Self-pay | Admitting: Cardiology

## 2017-02-13 ENCOUNTER — Encounter (HOSPITAL_COMMUNITY): Payer: Self-pay

## 2017-02-13 ENCOUNTER — Ambulatory Visit (HOSPITAL_COMMUNITY): Payer: Medicare Other

## 2017-02-13 ENCOUNTER — Encounter (HOSPITAL_COMMUNITY): Payer: Self-pay | Admitting: Speech Pathology

## 2017-02-13 ENCOUNTER — Other Ambulatory Visit: Payer: Self-pay

## 2017-02-13 ENCOUNTER — Ambulatory Visit (HOSPITAL_COMMUNITY): Payer: Medicare Other | Admitting: Speech Pathology

## 2017-02-13 DIAGNOSIS — R293 Abnormal posture: Secondary | ICD-10-CM

## 2017-02-13 DIAGNOSIS — R471 Dysarthria and anarthria: Secondary | ICD-10-CM | POA: Diagnosis not present

## 2017-02-13 DIAGNOSIS — R2681 Unsteadiness on feet: Secondary | ICD-10-CM | POA: Diagnosis not present

## 2017-02-13 DIAGNOSIS — R2689 Other abnormalities of gait and mobility: Secondary | ICD-10-CM | POA: Diagnosis not present

## 2017-02-13 DIAGNOSIS — M6281 Muscle weakness (generalized): Secondary | ICD-10-CM

## 2017-02-13 NOTE — Therapy (Signed)
[facility] [place], [place], [address] Phone: [phone]   Fax:  [phone]  Physical Therapy Treatment/Re-Assessment  Patient Details  Name: Abigail Taylor MRN: [id number] Date of Birth: 04/19/1931 Referring Provider: Inda Coke Tat, DO   Encounter Date: 02/13/2017  PT End of Session - 02/13/17 1433    Visit Number  5    Number of Visits  13    Date for PT Re-Evaluation  03/09/17    Authorization Type  Medicare (secondary: generic commercial)    Authorization Time Period  01/26/17 - 03/09/17    PT Start Time  1434    PT Stop Time  1515    PT Time Calculation (min)  41 min    Equipment Utilized During Treatment  Gait belt    Activity Tolerance  Patient tolerated treatment well    Behavior During Therapy  [facility] for tasks assessed/performed       Past Medical History:  Diagnosis Date  . Angina at rest, with the tachycardia 07/05/2011  . Aortic insufficiency 07/05/2011  . Atrial fibrillation ([place])   . Brady-tachy syndrome ([place])    Dual chamber Medtronic pacemaker Adapta  . Chronic back pain   . Constipation 12/27/2011  . Gait instability   . Hyperlipidemia   . Hypothyroidism   . Normal cardiac stress test 2013  . OA (osteoarthritis)   . Pacemaker   . Parkinson's disease ([place])   . Tremor     Past Surgical History:  Procedure Laterality Date  . ABDOMINAL HYSTERECTOMY    . CARDIAC CATHETERIZATION  05/26/1999   normal  . CARDIAC CATHETERIZATION  2001   normal coronary arteries  . cataract    . COLONOSCOPY N/A 11/15/2012   Procedure: COLONOSCOPY;  Surgeon: [name], MD;  [facility];  Service: Endoscopy;  Laterality: N/A;  1030  . HIP ARTHROPLASTY Left 06/24/2016   Procedure: ARTHROPLASTY BIPOLAR HIP (HEMIARTHROPLASTY);  Surgeon: Altamese Covington, MD;  Location: [place];  Service: Orthopedics;  Laterality: Left;  . INSERT / REPLACE / REMOVE PACEMAKER    . NM MYOVIEW LTD  07/19/2011   normal  . PERMANENT  PACEMAKER INSERTION  07/05/2011   Medtronic Adapta dual chamber  . PERMANENT PACEMAKER INSERTION N/A 07/05/2011   Procedure: PERMANENT PACEMAKER INSERTION;  Surgeon: [name], MD;  Location: [place]ATH [facility];  Service: Cardiovascular;  Laterality: N/A;    There were no vitals filed for this visit.  Subjective Assessment - 02/13/17 1828    Subjective  Patient states her left hip is still hurting her and that if she walks or puts a lot of weight on it that makes it worse. She states that stretching and moving helps overall though.    Limitations  Walking;Standing;House hold activities    How long can you sit comfortably?  no issues    How long can you stand comfortably?  back limits this    How long can you walk comfortably?  2-3 laps [facility]    Patient Stated Goals  feel better    Currently in Pain?  No/denies         [facility] PT Assessment - 02/13/17 0001      Assessment   Medical Diagnosis  Parkinson's Disease    Referring Provider  [place] Tat, DO    Next MD Visit  April 03, 2017    Prior Therapy  yes for L THA (d/c'd 08/2016)  Balance Screen   Has the patient fallen in the past 6 months  No    Has the patient had a decrease in activity level because of a fear of falling?   Yes    Is the patient reluctant to leave their home because of a fear of falling?   No      Strength   Right Hip Flexion  5/5    Right Hip Extension  4-/5    Right Hip ABduction  4+/5    Left Hip Flexion  5/5    Left Hip Extension  4-/5    Right Knee Flexion  5/5    Right Knee Extension  5/5    Left Knee Flexion  5/5    Left Knee Extension  5/5    Right Ankle Dorsiflexion  4+/5    Left Ankle Dorsiflexion  4+/5      Ambulation/Gait   Ambulation Distance (Feet)  658 Feet 3MWT    Assistive device  None    Gait Pattern  Step-through pattern;Decreased arm swing - right;Decreased arm swing - left;Right flexed knee in stance;Left flexed knee in stance;Decreased stance time - left     Ambulation Surface  Level      Balance   Balance Assessed  Yes      Static Standing Balance   Static Standing Balance -  Activities   Single Leg Stance - Right Leg;Single Leg Stance - Left Leg    Static Standing - Comment/# of Minutes  RLE: 25 seconds; LLE: 15 seconds      Standardized Balance Assessment   Standardized Balance Assessment  Dynamic Gait Index;Timed Up and Go Test;Five Times Sit to Stand    Five times sit to stand comments   13 seconds      Dynamic Gait Index   Level Surface  Normal    Change in Gait Speed  Normal    Gait with Horizontal Head Turns  Mild Impairment    Gait with Vertical Head Turns  Mild Impairment    Gait and Pivot Turn  Normal    Step Over Obstacle  Normal    Step Around Obstacles  Normal    Steps  Mild Impairment    Total Score  21    DGI comment:  21/24 indicating decreased fall risk during gait      Timed Up and Go Test   Normal TUG (seconds)  12.4      Functional Gait  Assessment   Gait assessed   Yes    Gait Level Surface  Walks 20 ft in less than 5.5 sec, no assistive devices, good speed, no evidence for imbalance, normal gait pattern, deviates no more than 6 in outside of the 12 in walkway width.    Change in Gait Speed  Able to smoothly change walking speed without loss of balance or gait deviation. Deviate no more than 6 in outside of the 12 in walkway width.    Gait with Horizontal Head Turns  Performs head turns smoothly with slight change in gait velocity (eg, minor disruption to smooth gait path), deviates 6-10 in outside 12 in walkway width, or uses an assistive device.    Gait with Vertical Head Turns  Performs task with slight change in gait velocity (eg, minor disruption to smooth gait path), deviates 6 - 10 in outside 12 in walkway width or uses assistive device    Gait and Pivot Turn  Pivot turns safely within 3 sec and stops quickly  with no loss of balance.    Step Over Obstacle  Is able to step over 2 stacked shoe boxes taped  together (9 in total height) without changing gait speed. No evidence of imbalance.    Gait with Narrow Base of Support  Ambulates 4-7 steps.    Gait with Eyes Closed  Walks 20 ft, uses assistive device, slower speed, mild gait deviations, deviates 6-10 in outside 12 in walkway width. Ambulates 20 ft in less than 9 sec but greater than 7 sec.    Ambulating Backwards  Walks 20 ft, uses assistive device, slower speed, mild gait deviations, deviates 6-10 in outside 12 in walkway width.    Steps  Alternating feet, must use rail.    Total Score  23    FGA comment:  23/30 indicated patient is at medium risk of falling during functional gait activities        [facility] Adult PT Treatment/Exercise - 02/13/17 0001      Knee/Hip Exercises: Stretches   Active Hamstring Stretch  Both;1 rep;30 seconds 12 inch step        PWR [facility]) - 02/13/17 1836    PWR! Up  x 15 [place]    PWR! Rock  x 15 [employer]! Step  x 15 [place]          PT Education - 02/13/17 1829    Education provided  Yes    Education Details  Instructed on exercise and technique throughout. Educated on progress towards goals this session.    Person(s) Educated  Patient    Methods  Explanation;Demonstration    Comprehension  Verbalized understanding;Returned demonstration       PT Short Term Goals - 02/13/17 1837      PT SHORT TERM GOAL #1   Title  Pt will be independent with HEP and perform consistently in order to maximize strength and balance.    Baseline  02/13/17 - patient reports compliance    Time  3    Period  Weeks    Status  Achieved    Target Date  02/16/17      PT SHORT TERM GOAL #2   Title  Pt will score at least 22/24 or > on the DGI to demo improved dynamic balance and decrease her risk for falls.     Baseline  02/13/17 - 21/24    Time  3    Period  Weeks    Status  On-going      PT SHORT TERM GOAL #3   Title  Pt will be able to perform SLS for 30 seconds on BLE to maximize gait on  uneven ground and decrease risk for falls.     Baseline  02/13/17 - RLE: 25 sec, LLE: 15 sec    Time  3    Period  Weeks    Status  On-going        PT Long Term Goals - 02/13/17 1839      PT LONG TERM GOAL #1   Title  Pt's MMT will improved to 4+/5 or greater of all muscle groups tested in order to maximize gait, balance, and allow pt to perform functional tasks with greater ease.     Baseline  02/13/17 - hip extension remains 4-/5    Time  6    Period  Weeks    Status  On-going      PT LONG TERM GOAL #2   Title  Pt will be able  to perform 5xSTS in 12 sec or < with no UE to demo improved functional strength and overall balance.     Baseline  02/13/17 - 13 seconds without UE use    Time  6    Period  Weeks    Status  On-going      PT LONG TERM GOAL #3   Title  Pt will score 25/30 or > on the FGA to demo decreased risk for falling and maximize pt's ability to access her community.     Baseline  02/13/17 - 23/30    Time  6    Period  Weeks    Status  On-going      PT LONG TERM GOAL #4   Title  Pt will be able to perform at least 67f on the 3MWT maintaining 1.01m gait speed or > and good arm swing for proper mechanics to demo improved overall gait, strength, and endurance.    Baseline  02/13/17 - 658 feet with no device, continues to have decreased arm swing and unequal step length    Time  6    Period  Weeks    Status  Partially Met        Plan - 02/13/17 1831    Clinical Impression Statement  Re-assessment performed today and patient has met met/partially met 1/3 short term goals and 1/4 long term goals. She has made excellent progress towards the remaining goals and has demonstrated improve functional strength through MMT and her 5 time sit to stand test improving from 20 to 13 seconds with no UE use. She also demonstrated and improvement in her DGI from 19/24 to 21/24 indicating a decreased fall risk and improved gait and balance with functional mobility. She will continue to  benefit from skilled PT services to address current impairments and progress towards remaining goals to improve functional independence.     Rehab Potential  Fair    PT Frequency  2x / week    PT Duration  6 weeks    PT Treatment/Interventions  ADLs/Self Care Home Management;Cryotherapy;Electrical Stimulation;Moist Heat;Gait training;Stair training;Functional mobility training;Therapeutic activities;Therapeutic exercise;Balance training;Neuromuscular re-education;Patient/family education;Manual techniques;Passive range of motion;Dry needling;Energy conservation;Taping    PT Next Visit Plan  Continue with gait trainer and PWR! moves in sitting and standing. Continue with hamstring stretch. Practice bed mobility, BLE and functional strengthening, postural strengthening, balance    PT Home Exercise Plan  eval: bridging, STS; 1/16: HS stretch in supine, PWR! Sitting    Consulted and Agree with Plan of Care  Patient       Patient will benefit from skilled therapeutic intervention in order to improve the following deficits and impairments:  Decreased activity tolerance, Decreased balance, Decreased endurance, Decreased mobility, Decreased strength, Difficulty walking, Hypomobility, Increased muscle spasms, Impaired flexibility, Improper body mechanics, Postural dysfunction  Visit Diagnosis: Muscle weakness (generalized)  Unsteadiness on feet  Other abnormalities of gait and mobility  Abnormal posture     Problem List Patient Active Problem List   Diagnosis Date Noted  . RBD (REM behavioral disorder) 01/10/2017  . Neurogenic orthostatic hypotension ([facility]12/18/2018  . Tachy-brady syndrome ([facility]05/31/2018  . Closed subcapital fracture of left femur ([facility]05/31/2018  . Orthostatic hypotension due to Parkinson's disease ([facility]CG[facility]05/31/2018  . PD (Parkinson's disease) ([facility]03/29/2018  . Hypokalemia 04/21/2016  . Normal coronary arteries 2001 05/08/2014  . Pacemaker 08/10/2012  . Aortic  insufficiency 08/10/2012  . Constipation 12/27/2011  . Family hx of colon cancer 12/27/2011  .  HTN (hypertension) 07/06/2011  . Hypothyroidism 07/05/2011  . Paroxysmal atrial fibrillation ([place]) 07/05/2011  . Sinus pause, 7 seconds post conversion, S/P MDT pacemaker 07/05/11 07/05/2011  . Dyslipidemia 07/05/2011  . Angina at rest, with rapid AF 07/05/2011    Kipp Brood, PT, DPT Physical Therapist with [facility]  02/13/2017 6:48 PM    [facility] [place], [place], [address] Phone: [phone]   Fax:  [phone]  Name: Abigail Taylor MRN: [id number] Date of Birth: Jul 27, 1931

## 2017-02-13 NOTE — Therapy (Signed)
[facility] [place], [place], [address] Phone: [phone]   Fax:  [phone]  Speech Language Pathology Treatment  Patient Details  Name: Abigail Taylor MRN: [id number] Date of Birth: [birth year] Referring Provider: Alonza Bogus, DO   Encounter Date: 02/13/2017  End of Session - 02/13/17 1804    Visit Number  5    Number of Visits  9    Date for SLP Re-Evaluation  02/28/17    Authorization Type  Generic Commercial Medicare    SLP Start Time  1522    SLP Stop Time   4970    SLP Time Calculation (min)  42 min    Activity Tolerance  Patient tolerated treatment well       Past Medical History:  Diagnosis Date  . Angina at rest, with the tachycardia 07/05/2011  . Aortic insufficiency 07/05/2011  . Atrial fibrillation ([place])   . Brady-tachy syndrome ([place])    Dual chamber Medtronic pacemaker Adapta  . Chronic back pain   . Constipation 12/27/2011  . Gait instability   . Hyperlipidemia   . Hypothyroidism   . Normal cardiac stress test 2013  . OA (osteoarthritis)   . Pacemaker   . Parkinson's disease ([place])   . Tremor     Past Surgical History:  Procedure Laterality Date  . ABDOMINAL HYSTERECTOMY    . CARDIAC CATHETERIZATION  05/26/1999   normal  . CARDIAC CATHETERIZATION  2001   normal coronary arteries  . cataract    . COLONOSCOPY N/A 11/15/2012   Procedure: COLONOSCOPY;  Surgeon: [name], MD;  [facility];  Service: Endoscopy;  Laterality: N/A;  1030  . HIP ARTHROPLASTY Left 06/24/2016   Procedure: ARTHROPLASTY BIPOLAR HIP (HEMIARTHROPLASTY);  Surgeon: Altamese Wallula, MD;  Location: [facility];  Service: Orthopedics;  Laterality: Left;  . INSERT / REPLACE / REMOVE PACEMAKER    . NM MYOVIEW LTD  07/19/2011   normal  . PERMANENT PACEMAKER INSERTION  07/05/2011   Medtronic Adapta dual chamber  . PERMANENT PACEMAKER INSERTION N/A 07/05/2011   Procedure: PERMANENT PACEMAKER INSERTION;  Surgeon: [name], MD;  Location: [facility];  Service: Cardiovascular;  Laterality: N/A;    There were no vitals filed for this visit.  Subjective Assessment - 02/13/17 1803    Subjective  "My husband says my speech is getting better, but I don't know."    Currently in Pain?  No/denies            ADULT SLP TREATMENT - 02/13/17 0001      General Information   Behavior/Cognition  Alert;Cooperative;Pleasant mood    Patient Positioning  Upright in chair    Oral care provided  N/A    HPI  Abigail Taylor is an 84 yo woman who was referred for SLP therapy by Dr. Wells Guiles Tat due to Pt with a diagnosis of Parkinson's disease (~2016). She was receving PT in [place], but requested to have therapy closer to her home. She denies dysphagia. Her primary complaint is difficulty being understood due to hypophonia. She lives at home with her husband. She no longer drives, but remains physically active around the home. She previously sang in the choir at church, but stopped a couple of years ago due to dysphonia. She takes carbidopa/levodopa at 7AM, 11AM, 4 PM, and at bedtime. Pt also reports sialorrhea. Her primary physician is [name], MD.      Treatment Provided   Treatment provided  Cognitive-Linquistic      Pain Assessment   Pain Assessment  No/denies pain      Cognitive-Linquistic Treatment   Treatment focused on  Voice;Dysarthria;Patient/family/caregiver education    Skilled Treatment  vocal hygiene, and speech intelligibility strategies; /a/ adduction drills, tape recorded Pt for feedback and interpretation, functional phrases      Assessment / Recommendations / Plan   Plan  Continue with current plan of care         SLP Short Term Goals - 02/13/17 1804      SLP SHORT TERM GOAL #1   Title  The patient will use a strong, clear voice when communicating with family, friends and/or colleagues resulting in an 80% reduction in requests for repetition.    Baseline  Pt reports frequent  requests for clarification during conversations    Time  4    Period  Weeks    Status  On-going      SLP SHORT TERM GOAL #2   Title  Regularly practice voice building/strengthening exercises a minimum of 5 days/week for a 20+ minutes a day.    Baseline  Not completing at this time    Time  4    Period  Weeks    Status  On-going      SLP SHORT TERM GOAL #3   Title  TBD       SLP Long Term Goals - 01/26/17 1734      SLP LONG TERM GOAL #1   Title  Same as short       Plan - 02/13/17 1804    Clinical Impression Statement  Pt alert and engaged in therapy. She reports practicing breathing/voicing exercises at home with her husband. SLP asked Pt to demonstrate vocal warm up routine with SLP providing mi/mod cues for proper execution. Pt able to repeat "y buzz" drill with good carryover into rote phrases.  Pt required less cueing for breath support during oral reading of functional sentences today. She has difficulty carrying over strategies into conversational speech. HEP adjusted. Continue with plan of care.     Speech Therapy Frequency  2x / week    Duration  4 weeks    Treatment/Interventions  SLP instruction and feedback;Cueing hierarchy;Compensatory strategies;Compensatory techniques;Patient/family education    Potential to Achieve Goals  Good    Potential Considerations  Severity of impairments    SLP Home Exercise Plan  Pt will complete HEP as assigned to facilitate carryover of treatment strategies and techniques in home environment with use of written cues as needed.    Consulted and Agree with Plan of Care  Patient       Patient will benefit from skilled therapeutic intervention in order to improve the following deficits and impairments:   Dysarthria and anarthria    Problem List Patient Active Problem List   Diagnosis Date Noted  . RBD (REM behavioral disorder) 01/10/2017  . Neurogenic orthostatic hypotension ([place]) 01/10/2017  . Tachy-brady syndrome ([place]) 06/23/2016   . Closed subcapital fracture of left femur ([place]) 06/23/2016  . Orthostatic hypotension due to Parkinson's disease ([place]) 06/23/2016  . PD (Parkinson's disease) ([place]) 04/21/2016  . Hypokalemia 04/21/2016  . Normal coronary arteries 2001 05/08/2014  . Pacemaker 08/10/2012  . Aortic insufficiency 08/10/2012  . Constipation 12/27/2011  . Family hx of colon cancer 12/27/2011  . HTN (hypertension) 07/06/2011  . Hypothyroidism 07/05/2011  . Paroxysmal atrial fibrillation ([place]) 07/05/2011  . Sinus pause, 7 seconds post conversion, S/P MDT pacemaker 07/05/11 07/05/2011  .  Dyslipidemia 07/05/2011  . Angina at rest, with rapid AF 07/05/2011   Thank you,  Genene Churn, [place]  [facility] 02/13/2017, 6:05 PM  [facility] [address] [place], [place], [address] Phone: [phone]   Fax:  [phone]   Name: Abigail Taylor MRN: [id number] Date of Birth: May 21, 1931

## 2017-02-14 ENCOUNTER — Ambulatory Visit (HOSPITAL_COMMUNITY): Payer: Medicare Other | Admitting: Physical Therapy

## 2017-02-15 ENCOUNTER — Encounter (HOSPITAL_COMMUNITY): Payer: Self-pay

## 2017-02-15 ENCOUNTER — Encounter (HOSPITAL_COMMUNITY): Payer: Self-pay | Admitting: Speech Pathology

## 2017-02-15 ENCOUNTER — Ambulatory Visit (HOSPITAL_COMMUNITY): Payer: Medicare Other | Admitting: Speech Pathology

## 2017-02-15 ENCOUNTER — Ambulatory Visit (HOSPITAL_COMMUNITY): Payer: Medicare Other

## 2017-02-15 DIAGNOSIS — M6281 Muscle weakness (generalized): Secondary | ICD-10-CM | POA: Diagnosis not present

## 2017-02-15 DIAGNOSIS — S72012D Unspecified intracapsular fracture of left femur, subsequent encounter for closed fracture with routine healing: Secondary | ICD-10-CM | POA: Diagnosis not present

## 2017-02-15 DIAGNOSIS — R2689 Other abnormalities of gait and mobility: Secondary | ICD-10-CM | POA: Diagnosis not present

## 2017-02-15 DIAGNOSIS — R2681 Unsteadiness on feet: Secondary | ICD-10-CM | POA: Diagnosis not present

## 2017-02-15 DIAGNOSIS — R471 Dysarthria and anarthria: Secondary | ICD-10-CM

## 2017-02-15 DIAGNOSIS — R293 Abnormal posture: Secondary | ICD-10-CM | POA: Diagnosis not present

## 2017-02-15 NOTE — Therapy (Signed)
[facility] [place], [place], [address] Phone: [phone]   Fax:  [phone]  Speech Language Pathology Treatment  Patient Details  Name: Abigail Taylor MRN: [id number] Date of Birth: [birth year] Referring Provider: Alonza Bogus, DO   Encounter Date: 02/15/2017  End of Session - 02/15/17 2014    Visit Number  6    Number of Visits  9    Date for SLP Re-Evaluation  02/28/17    Authorization Type  Generic Commercial Medicare    SLP Start Time  1115    SLP Stop Time   1200    SLP Time Calculation (min)  45 min    Activity Tolerance  Patient tolerated treatment well       Past Medical History:  Diagnosis Date  . Angina at rest, with the tachycardia 07/05/2011  . Aortic insufficiency 07/05/2011  . Atrial fibrillation ([place])   . Brady-tachy syndrome ([place])    Dual chamber Medtronic pacemaker Adapta  . Chronic back pain   . Constipation 12/27/2011  . Gait instability   . Hyperlipidemia   . Hypothyroidism   . Normal cardiac stress test 2013  . OA (osteoarthritis)   . Pacemaker   . Parkinson's disease ([place])   . Tremor     Past Surgical History:  Procedure Laterality Date  . ABDOMINAL HYSTERECTOMY    . CARDIAC CATHETERIZATION  05/26/1999   normal  . CARDIAC CATHETERIZATION  2001   normal coronary arteries  . cataract    . COLONOSCOPY N/A 11/15/2012   Procedure: COLONOSCOPY;  Surgeon: [name], Abigail Taylor;  [facility];  Service: Endoscopy;  Laterality: N/A;  1030  . HIP ARTHROPLASTY Left 06/24/2016   Procedure: ARTHROPLASTY BIPOLAR HIP (HEMIARTHROPLASTY);  Surgeon: Altamese Stark, Abigail Taylor;  Location: [facility];  Service: Orthopedics;  Laterality: Left;  . INSERT / REPLACE / REMOVE PACEMAKER    . NM MYOVIEW LTD  07/19/2011   normal  . PERMANENT PACEMAKER INSERTION  07/05/2011   Medtronic Adapta dual chamber  . PERMANENT PACEMAKER INSERTION N/A 07/05/2011   Procedure: PERMANENT PACEMAKER INSERTION;  Surgeon: [name], Abigail Taylor;  Location: [facility];  Service: Cardiovascular;  Laterality: N/A;    There were no vitals filed for this visit.  Subjective Assessment - 02/15/17 2013    Subjective  "Something is wrong with my little dog."    Currently in Pain?  No/denies        ADULT SLP TREATMENT - 02/15/17 0001      General Information   Behavior/Cognition  Alert;Cooperative;Pleasant mood    Patient Positioning  Upright in chair    Oral care provided  N/A    HPI  Abigail Taylor is an 84 yo woman who was referred for SLP therapy by Dr. Wells Guiles Tat due to Pt with a diagnosis of Parkinson's disease (~2016). She was receving PT in [place], but requested to have therapy closer to her home. She denies dysphagia. Her primary complaint is difficulty being understood due to hypophonia. She lives at home with her husband. She no longer drives, but remains physically active around the home. She previously sang in the choir at church, but stopped a couple of years ago due to dysphonia. She takes carbidopa/levodopa at 7AM, 11AM, 4 PM, and at bedtime. Pt also reports sialorrhea. Her primary physician is Abigail School, Abigail Taylor.      Treatment Provided   Treatment provided  Cognitive-Linquistic      Pain Assessment  Pain Assessment  No/denies pain      Cognitive-Linquistic Treatment   Treatment focused on  Voice;Dysarthria;Patient/family/caregiver education    Skilled Treatment  vocal hygiene, and speech intelligibility strategies; /a/ adduction drills, tape recorded Pt for feedback and interpretation, functional phrases      Assessment / Recommendations / [place] with current plan of care      Progression Toward Goals   Progression toward goals  Progressing toward goals         SLP Short Term Goals - 02/15/17 2015      SLP SHORT TERM GOAL #1   Title  The patient will use a strong, clear voice when communicating with family, friends and/or colleagues resulting in an 80% reduction in  requests for repetition.    Baseline  Pt reports frequent requests for clarification during conversations    Time  4    Period  Weeks    Status  On-going      SLP SHORT TERM GOAL #2   Title  Regularly practice voice building/strengthening exercises a minimum of 5 days/week for a 20+ minutes a day.    Baseline  Not completing at this time    Time  4    Period  Weeks    Status  On-going      SLP SHORT TERM GOAL #3   Title  TBD       SLP Long Term Goals - 01/26/17 1734      SLP LONG TERM GOAL #1   Title  Same as short       Plan - 02/15/17 2015    Clinical Impression Statement Pt expressed feeling stressed since her last appointment because her dog has not been feeling well. Pt required mod cues to run through voice exercises particularly for vocal intensity and resonance with sustained /a/. Pt completed five cycles with 14-23 second phonation times. She read functional phrases with 100% acc for intensity with min cues. Continue plan of care and likely d/c to home program next week.    Speech Therapy Frequency  2x / week    Duration  4 weeks    Treatment/Interventions  SLP instruction and feedback;Cueing hierarchy;Compensatory strategies;Compensatory techniques;Patient/family education    Potential to Achieve Goals  Good    Potential Considerations  Severity of impairments    SLP Home Exercise Plan  Pt will complete HEP as assigned to facilitate carryover of treatment strategies and techniques in home environment with use of written cues as needed.    Consulted and Agree with Plan of Care  Patient       Patient will benefit from skilled therapeutic intervention in order to improve the following deficits and impairments:   Dysarthria and anarthria    Problem List Patient Active Problem List   Diagnosis Date Noted  . RBD (REM behavioral disorder) 01/10/2017  . Neurogenic orthostatic hypotension ([place]) 01/10/2017  . Tachy-brady syndrome ([place]) 06/23/2016  . Closed subcapital  fracture of left femur ([place]) 06/23/2016  . Orthostatic hypotension due to Parkinson's disease ([place]) 06/23/2016  . PD (Parkinson's disease) ([place]) 04/21/2016  . Hypokalemia 04/21/2016  . Normal coronary arteries 2001 05/08/2014  . Pacemaker 08/10/2012  . Aortic insufficiency 08/10/2012  . Constipation 12/27/2011  . Family hx of colon cancer 12/27/2011  . HTN (hypertension) 07/06/2011  . Hypothyroidism 07/05/2011  . Paroxysmal atrial fibrillation ([place]) 07/05/2011  . Sinus pause, 7 seconds post conversion, S/P MDT pacemaker 07/05/11 07/05/2011  . Dyslipidemia 07/05/2011  .  Angina at rest, with rapid AF 07/05/2011   Thank you,  Genene Churn, [place]  [facility] 02/15/2017, 8:16 PM  [facility] [place], [place], [address] Phone: [phone]   Fax:  [phone]   Name: Abigail Taylor MRN: [id number] Date of Birth: 1931/03/22

## 2017-02-15 NOTE — Therapy (Signed)
[facility] [place], [place], [address] Phone: [phone]   Fax:  [phone]  Physical Therapy Treatment  Patient Details  Name: Abigail Taylor MRN: [id number] Date of Birth: [birth year] Referring Provider: Inda Coke Tat, DO   Encounter Date: 02/15/2017  PT End of Session - 02/15/17 1041    Visit Number  6    Number of Visits  13    Date for PT Re-Evaluation  03/09/17    Authorization Type  Medicare (secondary: generic commercial)    Authorization Time Period  01/26/17 - 03/09/17    PT Start Time  1032    PT Stop Time  1112    PT Time Calculation (min)  40 min    Equipment Utilized During Treatment  Gait belt    Activity Tolerance  Patient tolerated treatment well    Behavior During Therapy  [facility] for tasks assessed/performed       Past Medical History:  Diagnosis Date  . Angina at rest, with the tachycardia 07/05/2011  . Aortic insufficiency 07/05/2011  . Atrial fibrillation ([place])   . Brady-tachy syndrome ([place])    Dual chamber Medtronic pacemaker Adapta  . Chronic back pain   . Constipation 12/27/2011  . Gait instability   . Hyperlipidemia   . Hypothyroidism   . Normal cardiac stress test 2013  . OA (osteoarthritis)   . Pacemaker   . Parkinson's disease ([place])   . Tremor     Past Surgical History:  Procedure Laterality Date  . ABDOMINAL HYSTERECTOMY    . CARDIAC CATHETERIZATION  05/26/1999   normal  . CARDIAC CATHETERIZATION  2001   normal coronary arteries  . cataract    . COLONOSCOPY N/A 11/15/2012   Procedure: COLONOSCOPY;  Surgeon: [name], MD;  [facility];  Service: Endoscopy;  Laterality: N/A;  1030  . HIP ARTHROPLASTY Left 06/24/2016   Procedure: ARTHROPLASTY BIPOLAR HIP (HEMIARTHROPLASTY);  Surgeon: Altamese Gratiot, MD;  Location: [facility];  Service: Orthopedics;  Laterality: Left;  . INSERT / REPLACE / REMOVE PACEMAKER    . NM MYOVIEW LTD  07/19/2011   normal  . PERMANENT PACEMAKER  INSERTION  07/05/2011   Medtronic Adapta dual chamber  . PERMANENT PACEMAKER INSERTION N/A 07/05/2011   Procedure: PERMANENT PACEMAKER INSERTION;  Surgeon: [name], MD;  Location: [facility];  Service: Cardiovascular;  Laterality: N/A;    There were no vitals filed for this visit.  Subjective Assessment - 02/15/17 1040    Subjective  Pt stated she is feeling good today, no reports of pain or recent falls.      Patient Stated Goals  feel better    Currently in Pain?  No/denies                      [facility] Adult PT Treatment/Exercise - 02/15/17 0001      Ambulation/Gait   Ambulation Distance (Feet)  552 Feet    Assistive device  None    Pre-Gait Activities  forward, retro and sidesteping    Gait Comments  BIG steps with cueing for arm sequence, used dovel rods to improve sequence      Knee/Hip Exercises: Stretches   Active Hamstring Stretch  3 reps;30 seconds supine iwth rope      Knee/Hip Exercises: Standing   Other Standing Knee Exercises  side-stepping with GTB 15' x2 RT    Other Standing Knee Exercises  6in hurdles alternating 2RT  [facility]) - 02/15/17 1041    PWR! Up  x 15    PWR Step  x15 each    PWR! Twist  15x Bil            PT Short Term Goals - 02/13/17 1837      PT SHORT TERM GOAL #1   Title  Pt will be independent with HEP and perform consistently in order to maximize strength and balance.    Baseline  02/13/17 - patient reports compliance    Time  3    Period  Weeks    Status  Achieved    Target Date  02/16/17      PT SHORT TERM GOAL #2   Title  Pt will score at least 22/24 or > on the DGI to demo improved dynamic balance and decrease her risk for falls.     Baseline  02/13/17 - 21/24    Time  3    Period  Weeks    Status  On-going      PT SHORT TERM GOAL #3   Title  Pt will be able to perform SLS for 30 seconds on BLE to maximize gait on uneven ground and decrease risk for falls.     Baseline  02/13/17 - RLE: 25 sec,  LLE: 15 sec    Time  3    Period  Weeks    Status  On-going        PT Long Term Goals - 02/13/17 1839      PT LONG TERM GOAL #1   Title  Pt's MMT will improved to 4+/5 or greater of all muscle groups tested in order to maximize gait, balance, and allow pt to perform functional tasks with greater ease.     Baseline  02/13/17 - hip extension remains 4-/5    Time  6    Period  Weeks    Status  On-going      PT LONG TERM GOAL #2   Title  Pt will be able to perform 5xSTS in 12 sec or < with no UE to demo improved functional strength and overall balance.     Baseline  02/13/17 - 13 seconds without UE use    Time  6    Period  Weeks    Status  On-going      PT LONG TERM GOAL #3   Title  Pt will score 25/30 or > on the FGA to demo decreased risk for falling and maximize pt's ability to access her community.     Baseline  02/13/17 - 23/30    Time  6    Period  Weeks    Status  On-going      PT LONG TERM GOAL #4   Title  Pt will be able to perform at least 657f on the 3MWT maintaining 1.063m gait speed or > and good arm swing for proper mechanics to demo improved overall gait, strength, and endurance.    Baseline  02/13/17 - 658 feet with no device, continues to have decreased arm swing and unequal step length    Time  6    Period  Weeks    Status  Partially Met            Plan - 02/15/17 1119    Clinical Impression Statement  Session focus with increased standing PWR! balance training and gait training.  Began standing PWR! up with ball on 18in step and obstacle course gait training.  Pt educated on importance of BIG steps to reduce shuffle gait and improve mechanics, cueing for UE/LE sequence wiht moderate cueing required.  Began sequence with dowel rods in hands with vast improvements with sequence following.  No reports of pain through session, was limited by fatigue.      Rehab Potential  Fair    PT Frequency  2x / week    PT Duration  6 weeks    PT Treatment/Interventions   ADLs/Self Care Home Management;Cryotherapy;Electrical Stimulation;Moist Heat;Gait training;Stair training;Functional mobility training;Therapeutic activities;Therapeutic exercise;Balance training;Neuromuscular re-education;Patient/family education;Manual techniques;Passive range of motion;Dry needling;Energy conservation;Taping    PT Next Visit Plan  Continue with gait trainer and PWR! moves in sitting and standing. Continue with hamstring stretch. Practice bed mobility, BLE and functional strengthening, postural strengthening, balance    PT Home Exercise Plan  eval: bridging, STS; 1/16: HS stretch in supine, PWR! Sitting       Patient will benefit from skilled therapeutic intervention in order to improve the following deficits and impairments:  Decreased activity tolerance, Decreased balance, Decreased endurance, Decreased mobility, Decreased strength, Difficulty walking, Hypomobility, Increased muscle spasms, Impaired flexibility, Improper body mechanics, Postural dysfunction  Visit Diagnosis: Muscle weakness (generalized)  Unsteadiness on feet  Other abnormalities of gait and mobility     Problem List Patient Active Problem List   Diagnosis Date Noted  . RBD (REM behavioral disorder) 01/10/2017  . Neurogenic orthostatic hypotension ([place]) 01/10/2017  . Tachy-brady syndrome ([place]) 06/23/2016  . Closed subcapital fracture of left femur ([place]) 06/23/2016  . Orthostatic hypotension due to Parkinson's disease ([place]) 06/23/2016  . PD (Parkinson's disease) ([place]) 04/21/2016  . Hypokalemia 04/21/2016  . Normal coronary arteries 2001 05/08/2014  . Pacemaker 08/10/2012  . Aortic insufficiency 08/10/2012  . Constipation 12/27/2011  . Family hx of colon cancer 12/27/2011  . HTN (hypertension) 07/06/2011  . Hypothyroidism 07/05/2011  . Paroxysmal atrial fibrillation ([place]) 07/05/2011  . Sinus pause, 7 seconds post conversion, S/P MDT pacemaker 07/05/11 07/05/2011  . Dyslipidemia 07/05/2011  .  Angina at rest, with rapid AF 07/05/2011   Ihor Austin, [place]; [facility] [phone]  Aldona Lento 02/15/2017, 11:33 AM  [facility] [place], [place], [address] Phone: [phone]   Fax:  [phone]  Name: Abigail Taylor MRN: [id number] Date of Birth: 20-Jun-1931

## 2017-02-16 ENCOUNTER — Ambulatory Visit (HOSPITAL_COMMUNITY): Payer: Medicare Other | Admitting: Physical Therapy

## 2017-02-20 ENCOUNTER — Ambulatory Visit (HOSPITAL_COMMUNITY): Payer: Medicare Other | Admitting: Speech Pathology

## 2017-02-20 ENCOUNTER — Encounter (HOSPITAL_COMMUNITY): Payer: Self-pay

## 2017-02-20 ENCOUNTER — Ambulatory Visit (HOSPITAL_COMMUNITY): Payer: Medicare Other

## 2017-02-20 ENCOUNTER — Encounter (HOSPITAL_COMMUNITY): Payer: Self-pay | Admitting: Speech Pathology

## 2017-02-20 DIAGNOSIS — R293 Abnormal posture: Secondary | ICD-10-CM | POA: Diagnosis not present

## 2017-02-20 DIAGNOSIS — M6281 Muscle weakness (generalized): Secondary | ICD-10-CM

## 2017-02-20 DIAGNOSIS — R2681 Unsteadiness on feet: Secondary | ICD-10-CM

## 2017-02-20 DIAGNOSIS — R471 Dysarthria and anarthria: Secondary | ICD-10-CM | POA: Diagnosis not present

## 2017-02-20 DIAGNOSIS — R2689 Other abnormalities of gait and mobility: Secondary | ICD-10-CM | POA: Diagnosis not present

## 2017-02-20 NOTE — Therapy (Signed)
[facility] [place], [place], [address] Phone: [phone]   Fax:  [phone]  Speech Language Pathology Treatment  Patient Details  Name: Abigail Taylor MRN: [id number] Date of Birth: 01-Jun-1931 Referring Provider: Alonza Bogus, DO   Encounter Date: 02/20/2017  End of Session - 02/20/17 1539    Visit Number  7    Number of Visits  9    Date for SLP Re-Evaluation  02/28/17    Authorization Type  Generic Commercial Medicare    SLP Start Time  1519    SLP Stop Time   1600    SLP Time Calculation (min)  41 min    Activity Tolerance  Patient tolerated treatment well       Past Medical History:  Diagnosis Date  . Angina at rest, with the tachycardia 07/05/2011  . Aortic insufficiency 07/05/2011  . Atrial fibrillation ([place])   . Brady-tachy syndrome ([place])    Dual chamber Medtronic pacemaker Adapta  . Chronic back pain   . Constipation 12/27/2011  . Gait instability   . Hyperlipidemia   . Hypothyroidism   . Normal cardiac stress test 2013  . OA (osteoarthritis)   . Pacemaker   . Parkinson's disease ([place])   . Tremor     Past Surgical History:  Procedure Laterality Date  . ABDOMINAL HYSTERECTOMY    . CARDIAC CATHETERIZATION  05/26/1999   normal  . CARDIAC CATHETERIZATION  2001   normal coronary arteries  . cataract    . COLONOSCOPY N/A 11/15/2012   Procedure: COLONOSCOPY;  Surgeon: [name], MD;  [facility];  Service: Endoscopy;  Laterality: N/A;  1030  . HIP ARTHROPLASTY Left 06/24/2016   Procedure: ARTHROPLASTY BIPOLAR HIP (HEMIARTHROPLASTY);  Surgeon: Altamese Earlston, MD;  Location: [facility];  Service: Orthopedics;  Laterality: Left;  . INSERT / REPLACE / REMOVE PACEMAKER    . NM MYOVIEW LTD  07/19/2011   normal  . PERMANENT PACEMAKER INSERTION  07/05/2011   Medtronic Adapta dual chamber  . PERMANENT PACEMAKER INSERTION N/A 07/05/2011   Procedure: PERMANENT PACEMAKER INSERTION;  Surgeon: [name], MD;  Location: [facility];  Service: Cardiovascular;  Laterality: N/A;    There were no vitals filed for this visit.  Subjective Assessment - 02/20/17 1536    Subjective  "Do you think I need more?"    Currently in Pain?  No/denies       ADULT SLP TREATMENT - 02/20/17 0001      General Information   Behavior/Cognition  Alert;Cooperative;Pleasant mood    Patient Positioning  Upright in chair    HPI  Abigail Taylor is an 84 yo woman who was referred for SLP therapy by Dr. Wells Guiles Tat due to Pt with a diagnosis of Parkinson's disease (~2016). She was receving PT in [place], but requested to have therapy closer to her home. She denies dysphagia. Her primary complaint is difficulty being understood due to hypophonia. She lives at home with her husband. She no longer drives, but remains physically active around the home. She previously sang in the choir at church, but stopped a couple of years ago due to dysphonia. She takes carbidopa/levodopa at 7AM, 11AM, 4 PM, and at bedtime. Pt also reports sialorrhea. Her primary physician is [name], MD.      Treatment Provided   Treatment provided  Cognitive-Linquistic      Pain Assessment   Pain Assessment  No/denies pain  Cognitive-Linquistic Treatment   Treatment focused on  Voice;Dysarthria;Patient/family/caregiver education    Skilled Treatment  vocal hygiene, and speech intelligibility strategies; /a/ adduction drills, tape recorded Pt for feedback and interpretation, functional phrases      Assessment / Recommendations / Plan   Plan  Continue with current plan of care         SLP Short Term Goals - 02/20/17 1540      SLP SHORT TERM GOAL #1   Title  The patient will use a strong, clear voice when communicating with family, friends and/or colleagues resulting in an 80% reduction in requests for repetition.    Baseline  Pt reports frequent requests for clarification during conversations    Time  4    Period  Weeks     Status  On-going      SLP SHORT TERM GOAL #2   Title  Regularly practice voice building/strengthening exercises a minimum of 5 days/week for a 20+ minutes a day.    Baseline  Not completing at this time    Time  4    Period  Weeks    Status  On-going      SLP SHORT TERM GOAL #3   Title  TBD       SLP Long Term Goals - 01/26/17 1734      SLP LONG TERM GOAL #1   Title  Same as short       Plan - 02/20/17 1539    Clinical Impression Statement Pt voiced concerns about discharging from SLP services soon as discussed last session. SLP explained that the goal was for her to be independent (or with assist from spouse) with home exercise program for voice maintenance while living with Parkinson's. SLP asked Pt to demonstrate her vocal/speech routine with her written cues. She has difficulty self monitoring her pitch and vocal intensity on her own. She does not have a way to record herself at home. SLP asked Pt to bring her husband to the next session so that he can be educated on her HEP. She appears more fatigued and still worried about her dog (had to spend $1000.00 at the vet last week). SLP provided mod cues for pitch lowering/relaxation, increased resonance, and increased vocal intensity. She implemented strategies when reading functional sentences independently, but loses intensity with spontaneous speech. SLP further explained that as long as her husband could attend the next session, we would likely discharge from SLP at that time. She may need additional voice therapy if her functional status changes in the future. Continue POC.    Speech Therapy Frequency  2x / week    Duration  4 weeks    Treatment/Interventions  SLP instruction and feedback;Cueing hierarchy;Compensatory strategies;Compensatory techniques;Patient/family education    Potential to Achieve Goals  Good    Potential Considerations  Severity of impairments    SLP Home Exercise Plan  Pt will complete HEP as assigned to  facilitate carryover of treatment strategies and techniques in home environment with use of written cues as needed.    Consulted and Agree with Plan of Care  Patient       Patient will benefit from skilled therapeutic intervention in order to improve the following deficits and impairments:   Dysarthria and anarthria    Problem List Patient Active Problem List   Diagnosis Date Noted  . RBD (REM behavioral disorder) 01/10/2017  . Neurogenic orthostatic hypotension ([place]) 01/10/2017  . Tachy-brady syndrome ([place]) 06/23/2016  . Closed subcapital fracture of left  femur ([place]) 06/23/2016  . Orthostatic hypotension due to Parkinson's disease ([place]) 06/23/2016  . PD (Parkinson's disease) ([place]) 04/21/2016  . Hypokalemia 04/21/2016  . Normal coronary arteries 2001 05/08/2014  . Pacemaker 08/10/2012  . Aortic insufficiency 08/10/2012  . Constipation 12/27/2011  . Family hx of colon cancer 12/27/2011  . HTN (hypertension) 07/06/2011  . Hypothyroidism 07/05/2011  . Paroxysmal atrial fibrillation ([place]) 07/05/2011  . Sinus pause, 7 seconds post conversion, S/P MDT pacemaker 07/05/11 07/05/2011  . Dyslipidemia 07/05/2011  . Angina at rest, with rapid AF 07/05/2011    Thank you,  Genene Churn, [place]  [facility] 02/20/2017, 3:47 PM  [facility] [address] [place], [place], [address] Phone: [phone]   Fax:  [phone]   Name: LENA GORES MRN: [id number] Date of Birth: 02-26-1931

## 2017-02-20 NOTE — Therapy (Signed)
[place], [place], [address] Phone: [phone]   Fax:  [phone]  Physical Therapy Treatment  Patient Details  Name: Abigail Taylor MRN: [id number] Date of Birth: 1931/08/27 Referring Provider: Inda Coke Tat, DO   Encounter Date: 02/20/2017  PT End of Session - 02/20/17 1435    Visit Number  7    Number of Visits  13    Date for PT Re-Evaluation  03/09/17    Authorization Type  Medicare (secondary: generic commercial)    Authorization Time Period  01/26/17 - 03/09/17    PT Start Time  1431    PT Stop Time  1515    PT Time Calculation (min)  44 min    Equipment Utilized During Treatment  Gait belt    Activity Tolerance  Patient tolerated treatment well    Behavior During Therapy  [facility] for tasks assessed/performed       Past Medical History:  Diagnosis Date  . Angina at rest, with the tachycardia 07/05/2011  . Aortic insufficiency 07/05/2011  . Atrial fibrillation ([place])   . Brady-tachy syndrome ([place])    Dual chamber Medtronic pacemaker Adapta  . Chronic back pain   . Constipation 12/27/2011  . Gait instability   . Hyperlipidemia   . Hypothyroidism   . Normal cardiac stress test 2013  . OA (osteoarthritis)   . Pacemaker   . Parkinson's disease ([place])   . Tremor     Past Surgical History:  Procedure Laterality Date  . ABDOMINAL HYSTERECTOMY    . CARDIAC CATHETERIZATION  05/26/1999   normal  . CARDIAC CATHETERIZATION  2001   normal coronary arteries  . cataract    . COLONOSCOPY N/A 11/15/2012   Procedure: COLONOSCOPY;  Surgeon: [name], MD;  [facility];  Service: Endoscopy;  Laterality: N/A;  1030  . HIP ARTHROPLASTY Left 06/24/2016   Procedure: ARTHROPLASTY BIPOLAR HIP (HEMIARTHROPLASTY);  Surgeon: Altamese [place] Creek, MD;  Location: [facility];  Service: Orthopedics;  Laterality: Left;  . INSERT / REPLACE / REMOVE PACEMAKER    . NM MYOVIEW LTD  07/19/2011   normal  . PERMANENT PACEMAKER  INSERTION  07/05/2011   Medtronic Adapta dual chamber  . PERMANENT PACEMAKER INSERTION N/A 07/05/2011   Procedure: PERMANENT PACEMAKER INSERTION;  Surgeon: [name], MD;  Location: [facility]ATH [facility];  Service: Cardiovascular;  Laterality: N/A;    There were no vitals filed for this visit.  Subjective Assessment - 02/20/17 1434    Subjective  Pt states that she saw her hip surgeon, Dr. Marcelino Scot, who states that her hip is looking good and he will see her again in 2 years. She says that she was sore following last treatment session.     Patient Stated Goals  feel better    Currently in Pain?  No/denies             [facility] Adult PT Treatment/Exercise - 02/20/17 0001      Knee/Hip Exercises: Stretches   Active Hamstring Stretch  Both;3 reps;30 seconds    Active Hamstring Stretch Limitations  seated    Gastroc Stretch  Both;3 reps;30 seconds    Gastroc Stretch Limitations  slant board      Knee/Hip Exercises: Standing   Forward Lunges  Both;10 reps    Forward Lunges Limitations  front foot elevated on 4" step    Forward Step Up  Both;15 reps;Hand Hold: 0;Step Height: 6"    Personnel officer  x2  laps around gym, good carry-over of arm swing and big steps noted    Other Standing Knee Exercises  sidestepping over 6" hurdles + horizontal GH abd during step over x5RT    Other Standing Knee Exercises  bil scap retraction and shoulder extension x15 reps with RTB       [facility]) - 02/20/17 1444    PWR! Up  x15 to 18" step with yellow ball    PWR! Twist  x15 bil with RTB              PT Education - 02/20/17 1435    Education provided  Yes    Education Details  exercise technique, POC    Person(s) Educated  Patient    Methods  Explanation;Demonstration    Comprehension  Verbalized understanding;Returned demonstration       PT Short Term Goals - 02/13/17 1837      PT SHORT TERM GOAL #1   Title  Pt will be independent with HEP and perform consistently in order to maximize strength  and balance.    Baseline  02/13/17 - patient reports compliance    Time  3    Period  Weeks    Status  Achieved    Target Date  02/16/17      PT SHORT TERM GOAL #2   Title  Pt will score at least 22/24 or > on the DGI to demo improved dynamic balance and decrease her risk for falls.     Baseline  02/13/17 - 21/24    Time  3    Period  Weeks    Status  On-going      PT SHORT TERM GOAL #3   Title  Pt will be able to perform SLS for 30 seconds on BLE to maximize gait on uneven ground and decrease risk for falls.     Baseline  02/13/17 - RLE: 25 sec, LLE: 15 sec    Time  3    Period  Weeks    Status  On-going        PT Long Term Goals - 02/13/17 1839      PT LONG TERM GOAL #1   Title  Pt's MMT will improved to 4+/5 or greater of all muscle groups tested in order to maximize gait, balance, and allow pt to perform functional tasks with greater ease.     Baseline  02/13/17 - hip extension remains 4-/5    Time  6    Period  Weeks    Status  On-going      PT LONG TERM GOAL #2   Title  Pt will be able to perform 5xSTS in 12 sec or < with no UE to demo improved functional strength and overall balance.     Baseline  02/13/17 - 13 seconds without UE use    Time  6    Period  Weeks    Status  On-going      PT LONG TERM GOAL #3   Title  Pt will score 25/30 or > on the FGA to demo decreased risk for falling and maximize pt's ability to access her community.     Baseline  02/13/17 - 23/30    Time  6    Period  Weeks    Status  On-going      PT LONG TERM GOAL #4   Title  Pt will be able to perform at least 675f on the 3MWT maintaining 1.051m gait speed or >  and good arm swing for proper mechanics to demo improved overall gait, strength, and endurance.    Baseline  02/13/17 - 658 feet with no device, continues to have decreased arm swing and unequal step length    Time  6    Period  Weeks    Status  Partially Met            Plan - 02/20/17 1614    Clinical Impression Statement   Continued with established POC this date. Continued with PWR! strengthening and performed with yellow weighted ball again as well as added RTB to standing PWR! Twist. Rest of session focused on strengthening, balance, and posture. Added seated adductor stretch for pt's L hip as she reported it pulling in her groin during ambulation which was affecting her gait mechanics and balance; added to HEP.    Rehab Potential  Fair    PT Frequency  2x / week    PT Duration  6 weeks    PT Treatment/Interventions  ADLs/Self Care Home Management;Cryotherapy;Electrical Stimulation;Moist Heat;Gait training;Stair training;Functional mobility training;Therapeutic activities;Therapeutic exercise;Balance training;Neuromuscular re-education;Patient/family education;Manual techniques;Passive range of motion;Dry needling;Energy conservation;Taping    PT Next Visit Plan  Continue with PWR! moves in sitting and standing. Continue with hamstring and adductor stretching in sitting. continue and progress BLE and functional strengthening, postural strengthening, balance    PT Home Exercise Plan  eval: bridging, STS; 1/16: HS stretch in supine, PWR! Sitting; 1/28: seated adductor stretching    Consulted and Agree with Plan of Care  Patient       Patient will benefit from skilled therapeutic intervention in order to improve the following deficits and impairments:  Decreased activity tolerance, Decreased balance, Decreased endurance, Decreased mobility, Decreased strength, Difficulty walking, Hypomobility, Increased muscle spasms, Impaired flexibility, Improper body mechanics, Postural dysfunction  Visit Diagnosis: Muscle weakness (generalized)  Unsteadiness on feet  Other abnormalities of gait and mobility  Abnormal posture     Problem List Patient Active Problem List   Diagnosis Date Noted  . RBD (REM behavioral disorder) 01/10/2017  . Neurogenic orthostatic hypotension ([place]) 01/10/2017  . Tachy-brady syndrome ([place])  06/23/2016  . Closed subcapital fracture of left femur ([place]) 06/23/2016  . Orthostatic hypotension due to Parkinson's disease ([place]) 06/23/2016  . PD (Parkinson's disease) ([place]) 04/21/2016  . Hypokalemia 04/21/2016  . Normal coronary arteries 2001 05/08/2014  . Pacemaker 08/10/2012  . Aortic insufficiency 08/10/2012  . Constipation 12/27/2011  . Family hx of colon cancer 12/27/2011  . HTN (hypertension) 07/06/2011  . Hypothyroidism 07/05/2011  . Paroxysmal atrial fibrillation ([place]) 07/05/2011  . Sinus pause, 7 seconds post conversion, S/P MDT pacemaker 07/05/11 07/05/2011  . Dyslipidemia 07/05/2011  . Angina at rest, with rapid AF 07/05/2011       Geraldine Solar PT, DPT  [facility] [address] [place], [place], [address] Phone: [phone]   Fax:  [phone]  Name: GLORIS SHIROMA MRN: [id number] Date of Birth: Oct 14, 1931

## 2017-02-20 NOTE — Patient Instructions (Signed)
  Hip Adductor Stretch  Seated in neutral spine, place affected leg straight out on stool with opposite knee bent. Keep back straight, bend at the hip towards bent knee. Should feel this along inner thigh of leg.  Don't have to prop foot up on stool.   Add in with other exercises, 3-5 stretches on the Left leg, holding for 15-30 seconds

## 2017-02-22 ENCOUNTER — Ambulatory Visit (HOSPITAL_COMMUNITY): Payer: Medicare Other

## 2017-02-22 ENCOUNTER — Encounter (HOSPITAL_COMMUNITY): Payer: Self-pay

## 2017-02-22 ENCOUNTER — Other Ambulatory Visit: Payer: Self-pay

## 2017-02-22 ENCOUNTER — Ambulatory Visit (HOSPITAL_COMMUNITY): Payer: Medicare Other | Admitting: Speech Pathology

## 2017-02-22 ENCOUNTER — Encounter (HOSPITAL_COMMUNITY): Payer: Self-pay | Admitting: Speech Pathology

## 2017-02-22 DIAGNOSIS — R293 Abnormal posture: Secondary | ICD-10-CM | POA: Diagnosis not present

## 2017-02-22 DIAGNOSIS — R2689 Other abnormalities of gait and mobility: Secondary | ICD-10-CM

## 2017-02-22 DIAGNOSIS — R471 Dysarthria and anarthria: Secondary | ICD-10-CM | POA: Diagnosis not present

## 2017-02-22 DIAGNOSIS — M6281 Muscle weakness (generalized): Secondary | ICD-10-CM

## 2017-02-22 DIAGNOSIS — R2681 Unsteadiness on feet: Secondary | ICD-10-CM

## 2017-02-22 NOTE — Therapy (Signed)
[place], [place], [address] Phone: [phone]   Fax:  [phone]  Physical Therapy Treatment  Patient Details  Name: Abigail Taylor MRN: [id number] Date of Birth: 03/10/1931 Referring Provider: Inda Coke Tat, DO   Encounter Date: 02/22/2017  PT End of Session - 02/22/17 1030    Visit Number  8    Number of Visits  13    Date for PT Re-Evaluation  03/09/17    Authorization Type  Medicare (secondary: generic commercial)    Authorization Time Period  01/26/17 - 03/09/17    PT Start Time  1030    Equipment Utilized During Treatment  Gait belt    Activity Tolerance  Patient tolerated treatment well    Behavior During Therapy  [facility] for tasks assessed/performed       Past Medical History:  Diagnosis Date  . Angina at rest, with the tachycardia 07/05/2011  . Aortic insufficiency 07/05/2011  . Atrial fibrillation ([place])   . Brady-tachy syndrome ([place])    Dual chamber Medtronic pacemaker Adapta  . Chronic back pain   . Constipation 12/27/2011  . Gait instability   . Hyperlipidemia   . Hypothyroidism   . Normal cardiac stress test 2013  . OA (osteoarthritis)   . Pacemaker   . Parkinson's disease ([place])   . Tremor     Past Surgical History:  Procedure Laterality Date  . ABDOMINAL HYSTERECTOMY    . CARDIAC CATHETERIZATION  05/26/1999   normal  . CARDIAC CATHETERIZATION  2001   normal coronary arteries  . cataract    . COLONOSCOPY N/A 11/15/2012   Procedure: COLONOSCOPY;  Surgeon: [name], MD;  [facility];  Service: Endoscopy;  Laterality: N/A;  1030  . HIP ARTHROPLASTY Left 06/24/2016   Procedure: ARTHROPLASTY BIPOLAR HIP (HEMIARTHROPLASTY);  Surgeon: Altamese , MD;  Location: [place];  Service: Orthopedics;  Laterality: Left;  . INSERT / REPLACE / REMOVE PACEMAKER    . NM MYOVIEW LTD  07/19/2011   normal  . PERMANENT PACEMAKER INSERTION  07/05/2011   Medtronic Adapta dual chamber  .  PERMANENT PACEMAKER INSERTION N/A 07/05/2011   Procedure: PERMANENT PACEMAKER INSERTION;  Surgeon: [name], MD;  Location: [facility];  Service: Cardiovascular;  Laterality: N/A;    There were no vitals filed for this visit.  Subjective Assessment - 02/22/17 1031    Subjective  Pt sates that she's having some trouble with her sciatic nerve today. She states that her back pain is currently about 2/10. The new stretch helped her groin pain.    Patient Stated Goals  feel better    Currently in Pain?  Yes    Pain Score  2     Pain Location  Back    Pain Orientation  Lower    Pain Descriptors / Indicators  Sore    Pain Type  Chronic pain    Pain Onset  More than a month ago    Pain Frequency  Occasional    Aggravating Factors   nothing really    Pain Relieving Factors  sitting    Effect of Pain on Daily Activities  slight increase           [facility] Adult PT Treatment/Exercise - 02/22/17 0001      Knee/Hip Exercises: Stretches   Active Hamstring Stretch  Both;3 reps;30 seconds    Active Hamstring Stretch Limitations  seated    Other Knee/Hip Stretches  L seated  hip add stretch 3x30"       Knee/Hip Exercises: Standing   Rebounder  boom whackers reaching across midline and laterally outside BOS on firm NBOS and foam normal stance x10 mins total    Walking with Sports Cord  fwd/lateral/retro gait + varying ball tosses 17f x1RT each    Other Standing Knee Exercises  sidestepping over 6" hurdles + horizontal GH abd during step over x5RT      Knee/Hip Exercises: Seated   Sit to Sand  15 reps;without UE support +OH press with blue weighted ball      PWR ([facility] - 02/22/17 1048    PWR! Up  x15reps to 18" step with yellow weighted ball    PWR! Twist  x15 bil with RTB            PT Education - 02/22/17 1031    Education provided  Yes    Person(s) Educated  Patient    Methods  Explanation;Demonstration    Comprehension  Verbalized understanding;Returned demonstration        PT Short Term Goals - 02/13/17 1837      PT SHORT TERM GOAL #1   Title  Pt will be independent with HEP and perform consistently in order to maximize strength and balance.    Baseline  02/13/17 - patient reports compliance    Time  3    Period  Weeks    Status  Achieved    Target Date  02/16/17      PT SHORT TERM GOAL #2   Title  Pt will score at least 22/24 or > on the DGI to demo improved dynamic balance and decrease her risk for falls.     Baseline  02/13/17 - 21/24    Time  3    Period  Weeks    Status  On-going      PT SHORT TERM GOAL #3   Title  Pt will be able to perform SLS for 30 seconds on BLE to maximize gait on uneven ground and decrease risk for falls.     Baseline  02/13/17 - RLE: 25 sec, LLE: 15 sec    Time  3    Period  Weeks    Status  On-going        PT Long Term Goals - 02/13/17 1839      PT LONG TERM GOAL #1   Title  Pt's MMT will improved to 4+/5 or greater of all muscle groups tested in order to maximize gait, balance, and allow pt to perform functional tasks with greater ease.     Baseline  02/13/17 - hip extension remains 4-/5    Time  6    Period  Weeks    Status  On-going      PT LONG TERM GOAL #2   Title  Pt will be able to perform 5xSTS in 12 sec or < with no UE to demo improved functional strength and overall balance.     Baseline  02/13/17 - 13 seconds without UE use    Time  6    Period  Weeks    Status  On-going      PT LONG TERM GOAL #3   Title  Pt will score 25/30 or > on the FGA to demo decreased risk for falling and maximize pt's ability to access her community.     Baseline  02/13/17 - 23/30    Time  6    Period  Weeks  Status  On-going      PT LONG TERM GOAL #4   Title  Pt will be able to perform at least 633f on the 3MWT maintaining 1.085m gait speed or > and good arm swing for proper mechanics to demo improved overall gait, strength, and endurance.    Baseline  02/13/17 - 658 feet with no device, continues to have decreased  arm swing and unequal step length    Time  6    Period  Weeks    Status  Partially Met            Plan - 02/22/17 1113    Clinical Impression Statement  Continued with PWR! In standing as well as functional strengthening and balance this date. Pt did well with exercises, reporting that her LBP was resolved by EOS. Added boom whackers on varying surfaces and dynamic gait with ball toss. Pt with increased difficulty with retro ambulation + ball toss. Continue to progress as tolerated.     Rehab Potential  Fair    PT Frequency  2x / week    PT Duration  6 weeks    PT Treatment/Interventions  ADLs/Self Care Home Management;Cryotherapy;Electrical Stimulation;Moist Heat;Gait training;Stair training;Functional mobility training;Therapeutic activities;Therapeutic exercise;Balance training;Neuromuscular re-education;Patient/family education;Manual techniques;Passive range of motion;Dry needling;Energy conservation;Taping    PT Next Visit Plan  Continue with PWR! moves in sitting and standing. Continue with hamstring and adductor stretching in sitting. continue and progress BLE and functional strengthening, postural strengthening, ; add balance beam with or without hurdles    PT Home Exercise Plan  eval: bridging, STS; 1/16: HS stretch in supine, PWR! Sitting; 1/28: seated adductor stretching    Consulted and Agree with Plan of Care  Patient       Patient will benefit from skilled therapeutic intervention in order to improve the following deficits and impairments:  Decreased activity tolerance, Decreased balance, Decreased endurance, Decreased mobility, Decreased strength, Difficulty walking, Hypomobility, Increased muscle spasms, Impaired flexibility, Improper body mechanics, Postural dysfunction  Visit Diagnosis: Muscle weakness (generalized)  Unsteadiness on feet  Other abnormalities of gait and mobility  Abnormal posture     Problem List Patient Active Problem List   Diagnosis Date  Noted  . RBD (REM behavioral disorder) 01/10/2017  . Neurogenic orthostatic hypotension ([facility]12/18/2018  . Tachy-brady syndrome ([facility]05/31/2018  . Closed subcapital fracture of left femur ([facility]05/31/2018  . Orthostatic hypotension due to Parkinson's disease ([facility]05/31/2018  . PD (Parkinson's disease) ([facility]CDillon03/29/2018  . Hypokalemia 04/21/2016  . Normal coronary arteries 2001 05/08/2014  . Pacemaker 08/10/2012  . Aortic insufficiency 08/10/2012  . Constipation 12/27/2011  . Family hx of colon cancer 12/27/2011  . HTN (hypertension) 07/06/2011  . Hypothyroidism 07/05/2011  . Paroxysmal atrial fibrillation ([facility]06/11/2011  . Sinus pause, 7 seconds post conversion, S/P MDT pacemaker 07/05/11 07/05/2011  . Dyslipidemia 07/05/2011  . Angina at rest, with rapid AF 07/05/2011       BrGeraldine SolarT, DPT  [place]tNorth [place]NC[place]hone: [phone] Fax:  [phone]: EsCATELYN FRIELRN: [id number] of Birth: 01/1931-04-08

## 2017-02-22 NOTE — Therapy (Signed)
[place], [place], [address] Phone: [phone]   Fax:  [phone]  Speech Language Pathology Treatment  Patient Details  Name: Abigail Taylor MRN: [id number] Date of Birth: Sep 16, 1931 Referring Provider: Alonza Bogus, DO   Encounter Date: 02/22/2017  End of Session - 02/22/17 1347    Visit Number  8    Number of Visits  9    Date for SLP Re-Evaluation  02/28/17    Authorization Type  Generic Commercial Medicare    SLP Start Time  1120    SLP Stop Time   1210    SLP Time Calculation (min)  50 min    Activity Tolerance  Patient tolerated treatment well       Past Medical History:  Diagnosis Date  . Angina at rest, with the tachycardia 07/05/2011  . Aortic insufficiency 07/05/2011  . Atrial fibrillation ([place])   . Brady-tachy syndrome ([place])    Dual chamber Medtronic pacemaker Adapta  . Chronic back pain   . Constipation 12/27/2011  . Gait instability   . Hyperlipidemia   . Hypothyroidism   . Normal cardiac stress test 2013  . OA (osteoarthritis)   . Pacemaker   . Parkinson's disease ([place])   . Tremor     Past Surgical History:  Procedure Laterality Date  . ABDOMINAL HYSTERECTOMY    . CARDIAC CATHETERIZATION  05/26/1999   normal  . CARDIAC CATHETERIZATION  2001   normal coronary arteries  . cataract    . COLONOSCOPY N/A 11/15/2012   Procedure: COLONOSCOPY;  Surgeon: [name], MD;  [facility];  Service: Endoscopy;  Laterality: N/A;  1030  . HIP ARTHROPLASTY Left 06/24/2016   Procedure: ARTHROPLASTY BIPOLAR HIP (HEMIARTHROPLASTY);  Surgeon: Altamese [place], MD;  Location: [facility];  Service: Orthopedics;  Laterality: Left;  . INSERT / REPLACE / REMOVE PACEMAKER    . NM MYOVIEW LTD  07/19/2011   normal  . PERMANENT PACEMAKER INSERTION  07/05/2011   Medtronic Adapta dual chamber  . PERMANENT PACEMAKER INSERTION N/A 07/05/2011   Procedure: PERMANENT PACEMAKER INSERTION;  Surgeon: [name], MD;  Location: [facility];  Service: Cardiovascular;  Laterality: N/A;    There were no vitals filed for this visit.  Subjective Assessment - 02/22/17 1345    Subjective  "Sometimes I drop my voice low because I think I am being too loud."    Patient is accompained by:  Family member    Currently in Pain?  No/denies      ADULT SLP TREATMENT - 02/22/17 0001      General Information   Behavior/Cognition  Alert;Cooperative;Pleasant mood    Patient Positioning  Upright in chair    Oral care provided  N/A    HPI  Abigail Taylor is an 84 yo woman who was referred for SLP therapy by Dr. Wells Guiles Tat due to Pt with a diagnosis of Parkinson's disease (~2016). She was receving PT in [place], but requested to have therapy closer to her home. She denies dysphagia. Her primary complaint is difficulty being understood due to hypophonia. She lives at home with her husband. She no longer drives, but remains physically active around the home. She previously sang in the choir at church, but stopped a couple of years ago due to dysphonia. She takes carbidopa/levodopa at 7AM, 11AM, 4 PM, and at bedtime. Pt also reports sialorrhea. Her primary physician is [name], MD.      Treatment  Provided   Treatment provided  Cognitive-Linquistic      Pain Assessment   Pain Assessment  No/denies pain      Cognitive-Linquistic Treatment   Treatment focused on  Voice;Dysarthria;Patient/family/caregiver education    Skilled Treatment  vocal hygiene, and speech intelligibility strategies; /a/ adduction drills, tape recorded Pt for feedback and interpretation, functional phrases, conversational speech, spouse present for session      Assessment / Recommendations / Plan   Plan  Discharge SLP treatment due to (comment);All goals met       SLP Education - 02/22/17 1346    Education provided  Yes    Education Details  Reviewed HEP with Pt and spouse    Person(s) Educated  Patient;Spouse    Methods   Explanation;Demonstration;Handout;Verbal cues    Comprehension  Verbalized understanding       SLP Short Term Goals - 02/22/17 1347      SLP SHORT TERM GOAL #1   Title  The patient will use a strong, clear voice when communicating with family, friends and/or colleagues resulting in an 80% reduction in requests for repetition.    Baseline  Pt reports frequent requests for clarification during conversations    Time  4    Period  Weeks    Status  Achieved      SLP SHORT TERM GOAL #2   Title  Regularly practice voice building/strengthening exercises a minimum of 5 days/week for a 20+ minutes a day.    Baseline  Not completing at this time    Time  4    Period  Weeks    Status  Achieved      SLP SHORT TERM GOAL #3   Title  TBD       SLP Long Term Goals - 01/26/17 1734      SLP LONG TERM GOAL #1   Title  Same as short       Plan - 02/22/17 1347    Clinical Impression Statement Pt accompanied to treatment today by her spouse as requested by SLP during previous session. SLP explained goals, progress, and HEP going forward. Her husband reports that people frequently ask her to speak louder, but that since she has been coming to therapy it has improved. SLP guided Pt through HEP (written cues provided) and Pt continues to need assist to identify when loudness and pitch is appropriate. This was discussed with her husband and he is encouraged to assist her with this at home. He verbalizes understanding. Pt continues to benefit from audio recording for feedback and is encouraged to use at home (spouse says they do have a way to record). SLP further explained that it was imperative for her to continue with her exercises going forward for the rest of of her life as able. Pt is ready for discharge from SLP services at this time, however she may benefit from additional SLP services for dysarthria and dysphagia in the future should her needs change.    Speech Therapy Frequency  2x / week     Duration  4 weeks    Treatment/Interventions  SLP instruction and feedback;Cueing hierarchy;Compensatory strategies;Compensatory techniques;Patient/family education    Potential to Achieve Goals  Good    Potential Considerations  Severity of impairments    SLP Home Exercise Plan  Pt will complete HEP as assigned to facilitate carryover of treatment strategies and techniques in home environment with use of written cues as needed.    Consulted and Agree with Plan of Care  Patient       Patient will benefit from skilled therapeutic intervention in order to improve the following deficits and impairments:   Dysarthria and anarthria    Problem List Patient Active Problem List   Diagnosis Date Noted  . RBD (REM behavioral disorder) 01/10/2017  . Neurogenic orthostatic hypotension ([place]) 01/10/2017  . Tachy-brady syndrome ([place]) 06/23/2016  . Closed subcapital fracture of left femur ([place]) 06/23/2016  . Orthostatic hypotension due to Parkinson's disease ([place]) 06/23/2016  . PD (Parkinson's disease) ([place]) 04/21/2016  . Hypokalemia 04/21/2016  . Normal coronary arteries 2001 05/08/2014  . Pacemaker 08/10/2012  . Aortic insufficiency 08/10/2012  . Constipation 12/27/2011  . Family hx of colon cancer 12/27/2011  . HTN (hypertension) 07/06/2011  . Hypothyroidism 07/05/2011  . Paroxysmal atrial fibrillation ([place]) 07/05/2011  . Sinus pause, 7 seconds post conversion, S/P MDT pacemaker 07/05/11 07/05/2011  . Dyslipidemia 07/05/2011  . Angina at rest, with rapid AF 07/05/2011   SPEECH THERAPY DISCHARGE SUMMARY  Visits from Start of Care: 8  Current functional level related to goals / functional outcomes: Met   Remaining deficits: Mild dysphonia   Education / Equipment: Continue HEP going forward 3x/day  Plan: Patient agrees to discharge.  Patient goals were met. Patient is being discharged due to meeting the stated rehab goals.  ?????         Thank you,  Genene Churn,  [place]  [facility] 02/22/2017, 1:48 PM  [place] [address] [place], [place], [address] Phone: [phone]   Fax:  [phone]   Name: Abigail Taylor MRN: [id number] Date of Birth: [birth year]

## 2017-02-27 ENCOUNTER — Encounter (HOSPITAL_COMMUNITY): Payer: Self-pay

## 2017-02-27 ENCOUNTER — Ambulatory Visit (HOSPITAL_COMMUNITY): Payer: Medicare Other | Attending: Neurology

## 2017-02-27 DIAGNOSIS — R2681 Unsteadiness on feet: Secondary | ICD-10-CM | POA: Diagnosis not present

## 2017-02-27 DIAGNOSIS — R2689 Other abnormalities of gait and mobility: Secondary | ICD-10-CM | POA: Diagnosis not present

## 2017-02-27 DIAGNOSIS — R293 Abnormal posture: Secondary | ICD-10-CM

## 2017-02-27 DIAGNOSIS — M6281 Muscle weakness (generalized): Secondary | ICD-10-CM | POA: Diagnosis not present

## 2017-02-27 NOTE — Therapy (Signed)
[facility] [place], [place], [address] Phone: [phone]   Fax:  [phone]  Physical Therapy Treatment  Patient Details  Name: Abigail Taylor MRN: [id number] Date of Birth: 1931/12/09 Referring Provider: Inda Coke Tat, DO   Encounter Date: 02/27/2017  PT End of Session - 02/27/17 1349    Visit Number  9    Number of Visits  13    Date for PT Re-Evaluation  03/09/17    Authorization Type  Medicare (secondary: generic commercial)    Authorization Time Period  01/26/17 - 03/09/17    PT Start Time  1346    PT Stop Time  1431    PT Time Calculation (min)  45 min    Equipment Utilized During Treatment  Gait belt    Activity Tolerance  Patient tolerated treatment well    Behavior During Therapy  [facility] for tasks assessed/performed       Past Medical History:  Diagnosis Date  . Angina at rest, with the tachycardia 07/05/2011  . Aortic insufficiency 07/05/2011  . Atrial fibrillation ([place])   . Brady-tachy syndrome ([place])    Dual chamber Medtronic pacemaker Adapta  . Chronic back pain   . Constipation 12/27/2011  . Gait instability   . Hyperlipidemia   . Hypothyroidism   . Normal cardiac stress test 2013  . OA (osteoarthritis)   . Pacemaker   . Parkinson's disease ([place])   . Tremor     Past Surgical History:  Procedure Laterality Date  . ABDOMINAL HYSTERECTOMY    . CARDIAC CATHETERIZATION  05/26/1999   normal  . CARDIAC CATHETERIZATION  2001   normal coronary arteries  . cataract    . COLONOSCOPY N/A 11/15/2012   Procedure: COLONOSCOPY;  Surgeon: [name], MD;  [facility];  Service: Endoscopy;  Laterality: N/A;  1030  . HIP ARTHROPLASTY Left 06/24/2016   Procedure: ARTHROPLASTY BIPOLAR HIP (HEMIARTHROPLASTY);  Surgeon: Altamese Kenwood, MD;  Location: [facility];  Service: Orthopedics;  Laterality: Left;  . INSERT / REPLACE / REMOVE PACEMAKER    . NM MYOVIEW LTD  07/19/2011   normal  . PERMANENT PACEMAKER  INSERTION  07/05/2011   Medtronic Adapta dual chamber  . PERMANENT PACEMAKER INSERTION N/A 07/05/2011   Procedure: PERMANENT PACEMAKER INSERTION;  Surgeon: [name], MD;  Location: [facility] CATH LAB;  Service: Cardiovascular;  Laterality: N/A;    There were no vitals filed for this visit.  Subjective Assessment - 02/27/17 1349    Subjective  Pt states that she had a good weekend. She performed her HEP and it helped.     Patient Stated Goals  feel better    Currently in Pain?  No/denies    Pain Onset  More than a month ago            [facility] Adult PT Treatment/Exercise - 02/27/17 0001      Knee/Hip Exercises: Stretches   Active Hamstring Stretch  Both;3 reps;30 seconds    Active Hamstring Stretch Limitations  seated    Other Knee/Hip Stretches  Bil seated hip add stretch 2x30"       Knee/Hip Exercises: Standing   Gait Training  x3RT retro walking (2 RT with BTB); fwd/retro gait + tossing scarves x1RT each      Knee/Hip Exercises: Seated   Hamstring Limitations  sit to stands + blue scarf toss x10 reps each    Abd/Adduction Limitations  band pulls x15 reps with RTB for posture  PWR ([facility]) - 02/27/17 1356    PWR! Up  x15reps to 18" step with blue weighted ball    PWR! Rock  2x10 reaching for post-it notes on the wall well above shoulder level    PWR Step  x10RT bil over 6" hurdle tapping cones with ipsilateral UE             PT Education - 02/27/17 1350    Education provided  Yes    Person(s) Educated  Patient    Methods  Explanation;Demonstration    Comprehension  Verbalized understanding;Returned demonstration       PT Short Term Goals - 02/13/17 1837      PT SHORT TERM GOAL #1   Title  Pt will be independent with HEP and perform consistently in order to maximize strength and balance.    Baseline  02/13/17 - patient reports compliance    Time  3    Period  Weeks    Status  Achieved    Target Date  02/16/17      PT SHORT TERM GOAL #2   Title  Pt will  score at least 22/24 or > on the DGI to demo improved dynamic balance and decrease her risk for falls.     Baseline  02/13/17 - 21/24    Time  3    Period  Weeks    Status  On-going      PT SHORT TERM GOAL #3   Title  Pt will be able to perform SLS for 30 seconds on BLE to maximize gait on uneven ground and decrease risk for falls.     Baseline  02/13/17 - RLE: 25 sec, LLE: 15 sec    Time  3    Period  Weeks    Status  On-going        PT Long Term Goals - 02/13/17 1839      PT LONG TERM GOAL #1   Title  Pt's MMT will improved to 4+/5 or greater of all muscle groups tested in order to maximize gait, balance, and allow pt to perform functional tasks with greater ease.     Baseline  02/13/17 - hip extension remains 4-/5    Time  6    Period  Weeks    Status  On-going      PT LONG TERM GOAL #2   Title  Pt will be able to perform 5xSTS in 12 sec or < with no UE to demo improved functional strength and overall balance.     Baseline  02/13/17 - 13 seconds without UE use    Time  6    Period  Weeks    Status  On-going      PT LONG TERM GOAL #3   Title  Pt will score 25/30 or > on the FGA to demo decreased risk for falling and maximize pt's ability to access her community.     Baseline  02/13/17 - 23/30    Time  6    Period  Weeks    Status  On-going      PT LONG TERM GOAL #4   Title  Pt will be able to perform at least 600ft on the 3MWT maintaining 1.0m/s gait speed or > and good arm swing for proper mechanics to demo improved overall gait, strength, and endurance.    Baseline  02/13/17 - 658 feet with no device, continues to have decreased arm swing and unequal step length      Time  6    Period  Weeks    Status  Partially Met            Plan - 02/27/17 1435    Clinical Impression Statement  Continued with established therex, progressing PWR! Moves and dynamic gait and balance exercises. Pt tolerating well, but did demo difficulty with standing reach PWR! Moves due to increased  tightness. Pt unsteady with dynamic gait activities and required min A for balance. Added scarves this date and pt had difficulty with retro ambulation + tossing scarves as she had decreased gait speed and increased gait deviations. Continue as planned and progressing as tolerated.    Rehab Potential  Fair    PT Frequency  2x / week    PT Duration  6 weeks    PT Treatment/Interventions  ADLs/Self Care Home Management;Cryotherapy;Electrical Stimulation;Moist Heat;Gait training;Stair training;Functional mobility training;Therapeutic activities;Therapeutic exercise;Balance training;Neuromuscular re-education;Patient/family education;Manual techniques;Passive range of motion;Dry needling;Energy conservation;Taping    PT Next Visit Plan  Continue with PWR! moves in sitting and standing. Continue with hamstring and adductor stretching in sitting. continue and progress BLE and functional strengthening, postural strengthening; continue scarves for dynamic gait    PT Home Exercise Plan  eval: bridging, STS; 1/16: HS stretch in supine, PWR! Sitting; 1/28: seated adductor stretching    Consulted and Agree with Plan of Care  Patient       Patient will benefit from skilled therapeutic intervention in order to improve the following deficits and impairments:  Decreased activity tolerance, Decreased balance, Decreased endurance, Decreased mobility, Decreased strength, Difficulty walking, Hypomobility, Increased muscle spasms, Impaired flexibility, Improper body mechanics, Postural dysfunction  Visit Diagnosis: Muscle weakness (generalized)  Unsteadiness on feet  Other abnormalities of gait and mobility  Abnormal posture     Problem List Patient Active Problem List   Diagnosis Date Noted  . RBD (REM behavioral disorder) 01/10/2017  . Neurogenic orthostatic hypotension ([place]) 01/10/2017  . Tachy-brady syndrome ([place]) 06/23/2016  . Closed subcapital fracture of left femur ([place]) 06/23/2016  . Orthostatic  hypotension due to Parkinson's disease ([place]) 06/23/2016  . PD (Parkinson's disease) ([place]) 04/21/2016  . Hypokalemia 04/21/2016  . Normal coronary arteries 2001 05/08/2014  . Pacemaker 08/10/2012  . Aortic insufficiency 08/10/2012  . Constipation 12/27/2011  . Family hx of colon cancer 12/27/2011  . HTN (hypertension) 07/06/2011  . Hypothyroidism 07/05/2011  . Paroxysmal atrial fibrillation ([place]) 07/05/2011  . Sinus pause, 7 seconds post conversion, S/P MDT pacemaker 07/05/11 07/05/2011  . Dyslipidemia 07/05/2011  . Angina at rest, with rapid AF 07/05/2011        Geraldine Solar PT, DPT  [facility] [address] [place], [place], [address] Phone: [phone]   Fax:  [phone]  Name: Abigail Taylor MRN: [id number] Date of Birth: 1931/05/05

## 2017-03-01 ENCOUNTER — Ambulatory Visit (HOSPITAL_COMMUNITY): Payer: Medicare Other

## 2017-03-01 ENCOUNTER — Encounter (HOSPITAL_COMMUNITY): Payer: Self-pay

## 2017-03-01 DIAGNOSIS — R2681 Unsteadiness on feet: Secondary | ICD-10-CM | POA: Diagnosis not present

## 2017-03-01 DIAGNOSIS — M6281 Muscle weakness (generalized): Secondary | ICD-10-CM | POA: Diagnosis not present

## 2017-03-01 DIAGNOSIS — R293 Abnormal posture: Secondary | ICD-10-CM

## 2017-03-01 DIAGNOSIS — R2689 Other abnormalities of gait and mobility: Secondary | ICD-10-CM

## 2017-03-01 NOTE — Therapy (Signed)
[facility] [place], [place], [address] Phone: [phone]   Fax:  [phone]  Physical Therapy Treatment  Patient Details  Name: Abigail Taylor MRN: [id number] Date of Birth: 24-Nov-1931 Referring Provider: Inda Coke Tat, DO   Encounter Date: 03/01/2017  PT End of Session - 03/01/17 1346    Visit Number  10    Number of Visits  13    Date for PT Re-Evaluation  03/09/17    Authorization Type  Medicare (secondary: generic commercial)    Authorization Time Period  01/26/17 - 03/09/17    PT Start Time  1345    PT Stop Time  1427    PT Time Calculation (min)  42 min    Equipment Utilized During Treatment  Gait belt    Activity Tolerance  Patient tolerated treatment well    Behavior During Therapy  [facility] for tasks assessed/performed       Past Medical History:  Diagnosis Date  . Angina at rest, with the tachycardia 07/05/2011  . Aortic insufficiency 07/05/2011  . Atrial fibrillation ([place])   . Brady-tachy syndrome ([place])    Dual chamber Medtronic pacemaker Adapta  . Chronic back pain   . Constipation 12/27/2011  . Gait instability   . Hyperlipidemia   . Hypothyroidism   . Normal cardiac stress test 2013  . OA (osteoarthritis)   . Pacemaker   . Parkinson's disease ([place])   . Tremor     Past Surgical History:  Procedure Laterality Date  . ABDOMINAL HYSTERECTOMY    . CARDIAC CATHETERIZATION  05/26/1999   normal  . CARDIAC CATHETERIZATION  2001   normal coronary arteries  . cataract    . COLONOSCOPY N/A 11/15/2012   Procedure: COLONOSCOPY;  Surgeon: [name], MD;  [facility];  Service: Endoscopy;  Laterality: N/A;  1030  . HIP ARTHROPLASTY Left 06/24/2016   Procedure: ARTHROPLASTY BIPOLAR HIP (HEMIARTHROPLASTY);  Surgeon: Altamese [place], MD;  Location: [facility];  Service: Orthopedics;  Laterality: Left;  . INSERT / REPLACE / REMOVE PACEMAKER    . NM MYOVIEW LTD  07/19/2011   normal  . PERMANENT PACEMAKER  INSERTION  07/05/2011   Medtronic Adapta dual chamber  . PERMANENT PACEMAKER INSERTION N/A 07/05/2011   Procedure: PERMANENT PACEMAKER INSERTION;  Surgeon: [name], MD;  Location: [facility] CATH LAB;  Service: Cardiovascular;  Laterality: N/A;    There were no vitals filed for this visit.  Subjective Assessment - 03/01/17 1347    Subjective  Pt reports that her L hip is bothering her today. She said that it started after her appointment on Monday. She reports that she feels it pulling on the outside when she is walking on it, no pain when sitting.    Patient Stated Goals  feel better    Currently in Pain?  No/denies    Pain Onset  More than a month ago           [facility] Adult PT Treatment/Exercise - 03/01/17 0001      Knee/Hip Exercises: Stretches   Active Hamstring Stretch  Both;3 reps;30 seconds    Active Hamstring Stretch Limitations  seated    Other Knee/Hip Stretches  Bil seated hip add stretch 2x30"       Knee/Hip Exercises: Standing   SLS with Vectors  x5RT BLE    Gait Training  fwd/retro gait + tossing sarves (blue and red) x2RT    Other Standing Knee Exercises  bil tandem  stance on foam 2x15" each      Knee/Hip Exercises: Seated   Hamstring Limitations  sit to stands + blue scarf toss x15 reps each       PWR [facility]) - 03/01/17 1359    PWR! Up  x20 reps to 18" step with blue weighted ball    PWR! Rock  x15 reaching for [employer] the wall well above shoulder level     PWR Step  x10RT bil over 12" hurdle tapping cones with ipsilateral UE           PT Education - 03/01/17 1428    Education Details  exercise technique, will reassess next visit    Person(s) Educated  Patient    Methods  Explanation    Comprehension  Verbalized understanding       PT Short Term Goals - 02/13/17 1837      PT SHORT TERM GOAL #1   Title  Pt will be independent with HEP and perform consistently in order to maximize strength and balance.    Baseline  02/13/17 - patient reports  compliance    Time  3    Period  Weeks    Status  Achieved    Target Date  02/16/17      PT SHORT TERM GOAL #2   Title  Pt will score at least 22/24 or > on the DGI to demo improved dynamic balance and decrease her risk for falls.     Baseline  02/13/17 - 21/24    Time  3    Period  Weeks    Status  On-going      PT SHORT TERM GOAL #3   Title  Pt will be able to perform SLS for 30 seconds on BLE to maximize gait on uneven ground and decrease risk for falls.     Baseline  02/13/17 - RLE: 25 sec, LLE: 15 sec    Time  3    Period  Weeks    Status  On-going        PT Long Term Goals - 02/13/17 1839      PT LONG TERM GOAL #1   Title  Pt's MMT will improved to 4+/5 or greater of all muscle groups tested in order to maximize gait, balance, and allow pt to perform functional tasks with greater ease.     Baseline  02/13/17 - hip extension remains 4-/5    Time  6    Period  Weeks    Status  On-going      PT LONG TERM GOAL #2   Title  Pt will be able to perform 5xSTS in 12 sec or < with no UE to demo improved functional strength and overall balance.     Baseline  02/13/17 - 13 seconds without UE use    Time  6    Period  Weeks    Status  On-going      PT LONG TERM GOAL #3   Title  Pt will score 25/30 or > on the FGA to demo decreased risk for falling and maximize pt's ability to access her community.     Baseline  02/13/17 - 23/30    Time  6    Period  Weeks    Status  On-going      PT LONG TERM GOAL #4   Title  Pt will be able to perform at least 626f on the 3MWT maintaining 1.01m gait speed or > and good arm  swing for proper mechanics to demo improved overall gait, strength, and endurance.    Baseline  02/13/17 - 658 feet with no device, continues to have decreased arm swing and unequal step length    Time  6    Period  Weeks    Status  Partially Met            Plan - 03/01/17 1429    Clinical Impression Statement  Pt with slightly increased L hip pain this date so  monitored and modified exercises as needed during session. Continued with established therex, progressing pt's PWR! Reps this date. Pt continuing to demo difficulty with retro gait + scarves. Tandem balance on foam challenged pt. Pt due for reassessment next visit.    Rehab Potential  Fair    PT Frequency  2x / week    PT Duration  6 weeks    PT Treatment/Interventions  ADLs/Self Care Home Management;Cryotherapy;Electrical Stimulation;Moist Heat;Gait training;Stair training;Functional mobility training;Therapeutic activities;Therapeutic exercise;Balance training;Neuromuscular re-education;Patient/family education;Manual techniques;Passive range of motion;Dry needling;Energy conservation;Taping    PT Next Visit Plan  reassess; Continue with PWR! moves in sitting and standing. Continue with hamstring and adductor stretching in sitting. continue and progress BLE and functional strengthening, postural strengthening; continue scarves for dynamic gait    PT Home Exercise Plan  eval: bridging, STS; 1/16: HS stretch in supine, PWR! Sitting; 1/28: seated adductor stretching    Consulted and Agree with Plan of Care  Patient       Patient will benefit from skilled therapeutic intervention in order to improve the following deficits and impairments:  Decreased activity tolerance, Decreased balance, Decreased endurance, Decreased mobility, Decreased strength, Difficulty walking, Hypomobility, Increased muscle spasms, Impaired flexibility, Improper body mechanics, Postural dysfunction  Visit Diagnosis: Muscle weakness (generalized)  Unsteadiness on feet  Other abnormalities of gait and mobility  Abnormal posture     Problem List Patient Active Problem List   Diagnosis Date Noted  . RBD (REM behavioral disorder) 01/10/2017  . Neurogenic orthostatic hypotension ([place]) 01/10/2017  . Tachy-brady syndrome ([place]) 06/23/2016  . Closed subcapital fracture of left femur ([place]) 06/23/2016  . Orthostatic  hypotension due to Parkinson's disease ([place]) 06/23/2016  . PD (Parkinson's disease) ([place]) 04/21/2016  . Hypokalemia 04/21/2016  . Normal coronary arteries 2001 05/08/2014  . Pacemaker 08/10/2012  . Aortic insufficiency 08/10/2012  . Constipation 12/27/2011  . Family hx of colon cancer 12/27/2011  . HTN (hypertension) 07/06/2011  . Hypothyroidism 07/05/2011  . Paroxysmal atrial fibrillation ([place]) 07/05/2011  . Sinus pause, 7 seconds post conversion, S/P MDT pacemaker 07/05/11 07/05/2011  . Dyslipidemia 07/05/2011  . Angina at rest, with rapid AF 07/05/2011       Geraldine Solar PT, DPT  [facility] [address] [place], [place], [address] Phone: [phone]   Fax:  [phone]  Name: Abigail Taylor MRN: [id number] Date of Birth: Mar 23, 1931

## 2017-03-06 ENCOUNTER — Ambulatory Visit (HOSPITAL_COMMUNITY): Payer: Medicare Other

## 2017-03-07 LAB — CUP PACEART REMOTE DEVICE CHECK
Battery Impedance: 328 Ohm
Battery Remaining Longevity: 126 mo
Battery Voltage: 2.79 V
Brady Statistic AP VP Percent: 0 %
Brady Statistic AP VS Percent: 20 %
Brady Statistic AS VP Percent: 0 %
Brady Statistic AS VS Percent: 79 %
Date Time Interrogation Session: 20190110203414
Implantable Lead Implant Date: 20130611
Implantable Lead Implant Date: 20130611
Implantable Lead Location: 753859
Implantable Lead Location: 753860
Implantable Lead Model: 5076
Implantable Lead Model: 5076
Implantable Pulse Generator Implant Date: 20130611
Lead Channel Impedance Value: 430 Ohm
Lead Channel Impedance Value: 549 Ohm
Lead Channel Pacing Threshold Amplitude: 0.625 V
Lead Channel Pacing Threshold Amplitude: 0.75 V
Lead Channel Pacing Threshold Pulse Width: 0.4 ms
Lead Channel Pacing Threshold Pulse Width: 0.4 ms
Lead Channel Setting Pacing Amplitude: 1.5 V
Lead Channel Setting Pacing Amplitude: 2 V
Lead Channel Setting Pacing Pulse Width: 0.4 ms
Lead Channel Setting Sensing Sensitivity: 5.6 mV

## 2017-03-08 ENCOUNTER — Encounter (HOSPITAL_COMMUNITY): Payer: Self-pay

## 2017-03-08 ENCOUNTER — Ambulatory Visit (HOSPITAL_COMMUNITY): Payer: Medicare Other

## 2017-03-08 ENCOUNTER — Telehealth (HOSPITAL_COMMUNITY): Payer: Self-pay

## 2017-03-08 DIAGNOSIS — R2681 Unsteadiness on feet: Secondary | ICD-10-CM

## 2017-03-08 DIAGNOSIS — R293 Abnormal posture: Secondary | ICD-10-CM | POA: Diagnosis not present

## 2017-03-08 DIAGNOSIS — R2689 Other abnormalities of gait and mobility: Secondary | ICD-10-CM | POA: Diagnosis not present

## 2017-03-08 DIAGNOSIS — M6281 Muscle weakness (generalized): Secondary | ICD-10-CM

## 2017-03-08 NOTE — Therapy (Signed)
[facility] [address] [place], [place], [address] Phone: [phone]   Fax:  [phone]  Physical Therapy Treatment/Discharge Summary  PHYSICAL THERAPY DISCHARGE SUMMARY  Visits from Start of Care: 11  Current functional level related to goals / functional outcomes: See below   Remaining deficits: See below   Education / Equipment: HEP Plan: Patient agrees to discharge.  Patient goals were met. Patient is being discharged due to meeting the stated rehab goals.  ?????      Patient Details  Name: Abigail Taylor MRN: [id number] Date of Birth: 1931-12-04 Referring Provider: Alonza Bogus, DO     Encounter Date: 03/08/2017  PT End of Session - 03/08/17 1305    Visit Number  11    Number of Visits  13    Date for PT Re-Evaluation  03/09/17    Authorization Type  Medicare (secondary: generic commercial)    Authorization Time Period  01/26/17 - 03/09/17    PT Start Time  1300    PT Stop Time  1345    PT Time Calculation (min)  45 min    Equipment Utilized During Treatment  Gait belt    Activity Tolerance  Patient tolerated treatment well    Behavior During Therapy  [facility] for tasks assessed/performed       Past Medical History:  Diagnosis Date  . Angina at rest, with the tachycardia 07/05/2011  . Aortic insufficiency 07/05/2011  . Atrial fibrillation ([place])   . Brady-tachy syndrome ([place])    Dual chamber Medtronic pacemaker Adapta  . Chronic back pain   . Constipation 12/27/2011  . Gait instability   . Hyperlipidemia   . Hypothyroidism   . Normal cardiac stress test 2013  . OA (osteoarthritis)   . Pacemaker   . Parkinson's disease ([place])   . Tremor     Past Surgical History:  Procedure Laterality Date  . ABDOMINAL HYSTERECTOMY    . CARDIAC CATHETERIZATION  05/26/1999   normal  . CARDIAC CATHETERIZATION  2001   normal coronary arteries  . cataract    . COLONOSCOPY N/A 11/15/2012   Procedure: COLONOSCOPY;  Surgeon:  [name], MD;  [facility];  Service: Endoscopy;  Laterality: N/A;  1030  . HIP ARTHROPLASTY Left 06/24/2016   Procedure: ARTHROPLASTY BIPOLAR HIP (HEMIARTHROPLASTY);  Surgeon: Altamese Gilead, MD;  Location: [facility];  Service: Orthopedics;  Laterality: Left;  . INSERT / REPLACE / REMOVE PACEMAKER    . NM MYOVIEW LTD  07/19/2011   normal  . PERMANENT PACEMAKER INSERTION  07/05/2011   Medtronic Adapta dual chamber  . PERMANENT PACEMAKER INSERTION N/A 07/05/2011   Procedure: PERMANENT PACEMAKER INSERTION;  Surgeon: [name], MD;  Location: [facility]ATH [facility];  Service: Cardiovascular;  Laterality: N/A;    There were no vitals filed for this visit.      [facility] PT Assessment - 03/08/17 0001      Assessment   Medical Diagnosis  Parkinson's Disease    Referring Provider  Alonza Bogus, DO    Next MD Visit  April 03, 2017    Prior Therapy  yes for L THA (d/c'd 08/2016)      ROM / Strength   AROM / PROM / Strength  Strength      Strength   Strength Assessment Site  Hip    Right Hip Extension  4/5 was 4-    Right Hip ABduction  4+/5 was 4+    Left Hip Extension  4-/5 was4-    Left Hip ABduction  4+/5    Right Ankle Dorsiflexion  5/5 was 4+    Left Ankle Dorsiflexion  5/5 was 4+      Ambulation/Gait   Ambulation Distance (Feet)  668 Feet 3MWT, was 552    Assistive device  None    Gait Pattern  Step-through pattern;Decreased stance time - left;Decreased step length - right;Trendelenburg;Antalgic      Balance   Balance Assessed  Yes      Static Standing Balance   Static Standing - Balance Support  No upper extremity supported    Static Standing Balance -  Activities   Single Leg Stance - Right Leg;Single Leg Stance - Left Leg    Static Standing - Comment/# of Minutes  R: 18 sec L: 21 sec      Standardized Balance Assessment   Standardized Balance Assessment  Five Times Sit to Stand;Dynamic Gait Index    Five times sit to stand comments   10.6 sec, 2nd trial was 13 sec       Dynamic Gait Index   Level Surface  Normal    Change in Gait Speed  Normal    Gait with Horizontal Head Turns  Normal    Gait with Vertical Head Turns  Mild Impairment    Gait and Pivot Turn  Normal    Step Over Obstacle  Normal    Step Around Obstacles  Normal    Steps  Mild Impairment    Total Score  22    DGI comment:  was 21/24      Functional Gait  Assessment   Gait Level Surface  Walks 20 ft in less than 5.5 sec, no assistive devices, good speed, no evidence for imbalance, normal gait pattern, deviates no more than 6 in outside of the 12 in walkway width.    Change in Gait Speed  Able to smoothly change walking speed without loss of balance or gait deviation. Deviate no more than 6 in outside of the 12 in walkway width.    Gait with Horizontal Head Turns  Performs head turns smoothly with no change in gait. Deviates no more than 6 in outside 12 in walkway width    Gait with Vertical Head Turns  Performs task with slight change in gait velocity (eg, minor disruption to smooth gait path), deviates 6 - 10 in outside 12 in walkway width or uses assistive device    Gait and Pivot Turn  Pivot turns safely within 3 sec and stops quickly with no loss of balance.    Step Over Obstacle  Is able to step over 2 stacked shoe boxes taped together (9 in total height) without changing gait speed. No evidence of imbalance.    Gait with Narrow Base of Support  Is able to ambulate for 10 steps heel to toe with no staggering.    Gait with Eyes Closed  Walks 20 ft, slow speed, abnormal gait pattern, evidence for imbalance, deviates 10-15 in outside 12 in walkway width. Requires more than 9 sec to ambulate 20 ft. 13 sec to complete, min gait deviations, no evidence of imba    Ambulating Backwards  Walks 20 ft, no assistive devices, good speed, no evidence for imbalance, normal gait    Steps  Alternating feet, must use rail.    Total Score  26    FGA comment:  was 23/30         PT Short Term Goals  -  03/08/17 1304      PT SHORT TERM GOAL #1   Title  Pt will be independent with HEP and perform consistently in order to maximize strength and balance.    Baseline  02/13/17 - patient reports compliance    Time  3    Period  Weeks    Status  Achieved      PT SHORT TERM GOAL #2   Title  Pt will score at least 22/24 or > on the DGI to demo improved dynamic balance and decrease her risk for falls.     Baseline  2/13: 22/24    Time  3    Period  Weeks    Status  Achieved      PT SHORT TERM GOAL #3   Title  Pt will be able to perform SLS for 30 seconds on BLE to maximize gait on uneven ground and decrease risk for falls.     Baseline  2/13: RLE: 18sec, LLE: 21sec    Time  3    Period  Weeks    Status  On-going        PT Long Term Goals - 03/08/17 1305      PT LONG TERM GOAL #1   Title  Pt's MMT will improved to 4+/5 or greater of all muscle groups tested in order to maximize gait, balance, and allow pt to perform functional tasks with greater ease.     Baseline  2/13: all 4+/5 or > except for hip ext (R 4/5, L 4-/5)    Time  6    Period  Weeks    Status  Partially Met      PT LONG TERM GOAL #2   Title  Pt will be able to perform 5xSTS in 12 sec or < with no UE to demo improved functional strength and overall balance.     Baseline  2/13: 10.6 sec    Time  6    Period  Weeks    Status  Achieved      PT LONG TERM GOAL #3   Title  Pt will score 25/30 or > on the FGA to demo decreased risk for falling and maximize pt's ability to access her community.     Baseline  2/13: 26/30    Time  6    Period  Weeks    Status  Achieved      PT LONG TERM GOAL #4   Title  Pt will be able to perform at least 646f on the 3MWT maintaining 1.048m gait speed or > and good arm swing for proper mechanics to demo improved overall gait, strength, and endurance.    Baseline  2/13: [id number] speed, good arm swing and min gait deviations    Time  6    Period  Weeks    Status  Achieved             Plan - 03/08/17 1459    Clinical Impression Statement  PT reassessed pt's goals and outcome measures this date. Pt has made tremendous progress since starting therapy as illustrated above. She scored 22/24 on the DGI and 26/30 on the FGA, indicating she is at low risks for falls. Her functional strength improved AEB 5xSTS time and pt's 3MWT improvements. Pt's MMT has also improved, with hip extensors still being weak, but functional. The only goal she did not meet was her SLS goal. Pt states she f/u with Dr. Tat Carles Collet3/12/19 and is  not sure if Dr. Carles Collet will want her to continue therapy. PT provided pt with PWR! Handouts for sitting and standing moves and encouraged pt to continue performing these at home as part of HEP. At this time, pt is not a fall risk, her strength is functional, and she can perform SLS for >10 sec. She is ready for discharge.    Rehab Potential  Fair    PT Frequency  2x / week    PT Duration  6 weeks    PT Treatment/Interventions  ADLs/Self Care Home Management;Cryotherapy;Electrical Stimulation;Moist Heat;Gait training;Stair training;Functional mobility training;Therapeutic activities;Therapeutic exercise;Balance training;Neuromuscular re-education;Patient/family education;Manual techniques;Passive range of motion;Dry needling;Energy conservation;Taping    PT Next Visit Plan  discharged    PT Home Exercise Plan  eval: bridging, STS; 1/16: HS stretch in supine, PWR! Sitting; 1/28: seated adductor stretching; 2/13: standing and seated PWR! moves    Consulted and Agree with Plan of Care  Patient       Patient will benefit from skilled therapeutic intervention in order to improve the following deficits and impairments:  Decreased activity tolerance, Decreased balance, Decreased endurance, Decreased mobility, Decreased strength, Difficulty walking, Hypomobility, Increased muscle spasms, Impaired flexibility, Improper body mechanics, Postural dysfunction  Visit  Diagnosis: Muscle weakness (generalized)  Unsteadiness on feet  Other abnormalities of gait and mobility  Abnormal posture     Problem List Patient Active Problem List   Diagnosis Date Noted  . RBD (REM behavioral disorder) 01/10/2017  . Neurogenic orthostatic hypotension ([place]) 01/10/2017  . Tachy-brady syndrome ([place]) 06/23/2016  . Closed subcapital fracture of left femur ([place]) 06/23/2016  . Orthostatic hypotension due to Parkinson's disease ([place]) 06/23/2016  . PD (Parkinson's disease) ([place]) 04/21/2016  . Hypokalemia 04/21/2016  . Normal coronary arteries 2001 05/08/2014  . Pacemaker 08/10/2012  . Aortic insufficiency 08/10/2012  . Constipation 12/27/2011  . Family hx of colon cancer 12/27/2011  . HTN (hypertension) 07/06/2011  . Hypothyroidism 07/05/2011  . Paroxysmal atrial fibrillation ([place]) 07/05/2011  . Sinus pause, 7 seconds post conversion, S/P MDT pacemaker 07/05/11 07/05/2011  . Dyslipidemia 07/05/2011  . Angina at rest, with rapid AF 07/05/2011      Geraldine Solar PT, DPT  [facility] [address] [place], [place], [address] Phone: [phone]   Fax:  [phone]  Name: Abigail Taylor MRN: [id number] Date of Birth: 01-Aug-1931

## 2017-03-08 NOTE — Telephone Encounter (Signed)
Patient was discharged by PT Jerene Pitch)

## 2017-03-15 ENCOUNTER — Ambulatory Visit (HOSPITAL_COMMUNITY): Payer: Medicare Other

## 2017-03-15 LAB — CUP PACEART INCLINIC DEVICE CHECK
Battery Remaining Longevity: 131 mo
Battery Voltage: 2.79 V
Brady Statistic AP VS Percent: 24 %
Date Time Interrogation Session: 20180712135906
Implantable Lead Implant Date: 20130611
Implantable Lead Location: 753860
Implantable Lead Model: 5076
Lead Channel Pacing Threshold Amplitude: 0.75 V
Lead Channel Pacing Threshold Pulse Width: 0.4 ms
Lead Channel Pacing Threshold Pulse Width: 0.4 ms
Lead Channel Setting Pacing Amplitude: 1.5 V
Lead Channel Setting Pacing Pulse Width: 0.4 ms
MDC IDC LEAD IMPLANT DT: 20130611
MDC IDC LEAD LOCATION: 753859
MDC IDC MSMT BATTERY IMPEDANCE: 280 Ohm
MDC IDC MSMT LEADCHNL RA IMPEDANCE VALUE: 424 Ohm
MDC IDC MSMT LEADCHNL RA PACING THRESHOLD AMPLITUDE: 0.625 V
MDC IDC MSMT LEADCHNL RV IMPEDANCE VALUE: 538 Ohm
MDC IDC PG IMPLANT DT: 20130611
MDC IDC SET LEADCHNL RV PACING AMPLITUDE: 2 V
MDC IDC SET LEADCHNL RV SENSING SENSITIVITY: 5.6 mV
MDC IDC STAT BRADY AP VP PERCENT: 0 %
MDC IDC STAT BRADY AS VP PERCENT: 0 %
MDC IDC STAT BRADY AS VS PERCENT: 76 %

## 2017-03-20 ENCOUNTER — Ambulatory Visit (INDEPENDENT_AMBULATORY_CARE_PROVIDER_SITE_OTHER): Payer: Medicare Other | Admitting: Cardiovascular Disease

## 2017-03-20 ENCOUNTER — Encounter: Payer: Self-pay | Admitting: Cardiovascular Disease

## 2017-03-20 VITALS — BP 137/73 | HR 65 | Ht 66.0 in | Wt 122.4 lb

## 2017-03-20 DIAGNOSIS — Z7901 Long term (current) use of anticoagulants: Secondary | ICD-10-CM | POA: Diagnosis not present

## 2017-03-20 DIAGNOSIS — E7800 Pure hypercholesterolemia, unspecified: Secondary | ICD-10-CM | POA: Diagnosis not present

## 2017-03-20 DIAGNOSIS — I495 Sick sinus syndrome: Secondary | ICD-10-CM

## 2017-03-20 DIAGNOSIS — I480 Paroxysmal atrial fibrillation: Secondary | ICD-10-CM | POA: Diagnosis not present

## 2017-03-20 DIAGNOSIS — G903 Multi-system degeneration of the autonomic nervous system: Secondary | ICD-10-CM | POA: Diagnosis not present

## 2017-03-20 DIAGNOSIS — E441 Mild protein-calorie malnutrition: Secondary | ICD-10-CM

## 2017-03-20 DIAGNOSIS — Z950 Presence of cardiac pacemaker: Secondary | ICD-10-CM | POA: Diagnosis not present

## 2017-03-20 NOTE — Progress Notes (Addendum)
Patient ID: Abigail Taylor, female   DOB: 05-22-1931, 84 y.o.   MRN: [id number]    Cardiology Office Note    Date:  03/22/2017   ID:  Abigail Taylor, DOB 12-14-1931, MRN [id number]  PCP:  [name], MD  Cardiologist:   [name], MD   No chief complaint on file.   History of Present Illness:  Abigail Taylor is a 84 y.o. female with a history of sinus node dysfunction, recurrent paroxysmal and persistent atrial fibrillation with rapid ventricular response, tachycardia-bradycardia syndrome, status post implantation of a dual-chamber permanent pacemaker (Medtronic Adapta), Orthostatic hypotension related to Parkinson's syndrome/Autonomic dysfunction.  Biggest complaint is hip pain, following a fracture and hip replacement in May 2018.  Still receiving physical therapy twice a week.  Weight has been stable: mildly underweight.  Biggest problems have been her poor balance and poor mobility related to Parkinson's disease, but she has not had any falls or serious injuries.  She denies any bleeding problems  Normal pacemaker function. Her Medtronic Adapta dual-chamber device was implanted in 2012 and has roughly 10 years of remaining longevity. She is not pacemaker dependent and has roughly 20-30% atrial pacing with good heart rate histograms, does not require ventricular pacing. The burden of atrial fibrillation is 0.8 %.  She has only had 2 significant episodes of atrial fibrillation since her last device check.  On February 8 she had a 6-hour episode with an average ventricular rate of 133 bpm; on January 13 she had a 19-hour episode with an average ventricular rate of 134 bpm.  We have been avoiding the use of rate control medications due to her tendency to have orthostatic hypotension.  She is taking anticoagulation with Eliquis without any complications (lower dose for age and low body weight). She is on long-standing treatment with statin for hyperlipidemia. She had normal coronary  arteries by remote angiogram performed in 2001 and a normal nuclear stress test in 2013.    Past Medical History:  Diagnosis Date  . Angina at rest, with the tachycardia 07/05/2011  . Aortic insufficiency 07/05/2011  . Atrial fibrillation ([place])   . Brady-tachy syndrome ([place])    Dual chamber Medtronic pacemaker Adapta  . Chronic back pain   . Constipation 12/27/2011  . Gait instability   . Hyperlipidemia   . Hypothyroidism   . Normal cardiac stress test 2013  . OA (osteoarthritis)   . Pacemaker   . Parkinson's disease ([place])   . Tremor     Past Surgical History:  Procedure Laterality Date  . ABDOMINAL HYSTERECTOMY    . CARDIAC CATHETERIZATION  05/26/1999   normal  . CARDIAC CATHETERIZATION  2001   normal coronary arteries  . cataract    . COLONOSCOPY N/A 11/15/2012   Procedure: COLONOSCOPY;  Surgeon: [name], MD;  [facility];  Service: Endoscopy;  Laterality: N/A;  1030  . HIP ARTHROPLASTY Left 06/24/2016   Procedure: ARTHROPLASTY BIPOLAR HIP (HEMIARTHROPLASTY);  Surgeon: Altamese [employer], MD;  Location: [facility];  Service: Orthopedics;  Laterality: Left;  . INSERT / REPLACE / REMOVE PACEMAKER    . NM MYOVIEW LTD  07/19/2011   normal  . PERMANENT PACEMAKER INSERTION  07/05/2011   Medtronic Adapta dual chamber  . PERMANENT PACEMAKER INSERTION N/A 07/05/2011   Procedure: PERMANENT PACEMAKER INSERTION;  Surgeon: [name], MD;  Location: [facility] CATH [facility];  Service: Cardiovascular;  Laterality: N/A;    Current Medications: Outpatient Medications Prior to Visit  Medication Sig Dispense  Refill  . acetaminophen (TYLENOL) 325 MG tablet Take 2 tablets (650 mg total) by mouth every 6 (six) hours as needed for mild pain (or Fever >/= 101).    . carbidopa-levodopa (SINEMET CR) 50-200 MG tablet Take 1 tablet by mouth at bedtime. 90 tablet 1  . carbidopa-levodopa (SINEMET IR) 25-100 MG per tablet Take 1 tablet by mouth 3 (three) times daily.     Marland Kitchen ELIQUIS 2.5 MG TABS tablet  TAKE (1) TABLET BY MOUTH TWICE DAILY. 180 tablet 1  . fludrocortisone (FLORINEF) 0.1 MG tablet TAKE ONE TABLET BY MOUTH ONCE DAILY. 90 tablet 0  . levothyroxine (SYNTHROID, LEVOTHROID) 75 MCG tablet Take 75 mcg by mouth daily before breakfast.    . rosuvastatin (CRESTOR) 10 MG tablet Take 10 mg by mouth at bedtime. Reported on 07/09/2015    . senna-docusate (SENOKOT-S) 8.6-50 MG tablet Take 1 tablet by mouth at bedtime as needed for mild constipation.    Marland Kitchen XIIDRA 5 % SOLN      No facility-administered medications prior to visit.      Allergies:   Patient has no known allergies.   Social History   Socioeconomic History  . Marital status: Married    Spouse name: None  . Number of children: None  . Years of education: None  . Highest education level: None  Social Needs  . Financial resource strain: None  . Food insecurity - worry: None  . Food insecurity - inability: None  . Transportation needs - medical: None  . Transportation needs - non-medical: None  Occupational History  . Occupation: retired    Comment: Air cabin crew Tobacco  Tobacco Use  . Smoking status: Never Smoker  . Smokeless tobacco: Never Used  Substance and Sexual Activity  . Alcohol use: No    Alcohol/week: 0.0 oz  . Drug use: No  . Sexual activity: None  Other Topics Concern  . None  Social History Narrative  . None     Family History:  The patient's family history includes Cancer in her father; Colon cancer (age of onset: 40) in her mother; Heart attack in her mother; Suicidality in her son.   ROS:   Please see the history of present illness.    ROS All other systems reviewed and are negative.   PHYSICAL EXAM:   VS:  BP 137/73   Pulse 65   Ht 5\' 6"  (1.676 m)   Wt 122 lb 6.4 oz (55.5 kg)   BMI 19.76 kg/m     General: Alert, oriented x3, no distress. Appears very thin and frail Head: no evidence of trauma, PERRL, EOMI, no exophtalmos or lid lag, no myxedema, no xanthelasma; normal ears,  nose and oropharynx Neck: normal jugular venous pulsations and no hepatojugular reflux; brisk carotid pulses without delay and no carotid bruits Chest: clear to auscultation, no signs of consolidation by percussion or palpation, normal fremitus, symmetrical and full respiratory excursions Cardiovascular: normal position and quality of the apical impulse, regular rhythm, normal first and widely split second heart sounds, no murmurs, rubs or gallops. Healthy left subclavian pacemaker site Abdomen: no tenderness or distention, no masses by palpation, no abnormal pulsatility or arterial bruits, normal bowel sounds, no hepatosplenomegaly Extremities: no clubbing, cyanosis or edema; 2+ radial, ulnar and brachial pulses bilaterally; 2+ right femoral, posterior tibial and dorsalis pedis pulses; 2+ left femoral, posterior tibial and dorsalis pedis pulses; no subclavian or femoral bruits Neurological: grossly nonfocal   Wt Readings from Last 3 Encounters:  03/20/17  122 lb 6.4 oz (55.5 kg)  01/10/17 123 lb (55.8 kg)  09/07/16 122 lb (55.3 kg)      Studies/Labs Reviewed:   EKG:  EKG is ordered today.  Shows atrial paced, ventricular sensed rhythm with right bundle branch block and occasional PACs Recent Labs: 06/23/2016: ALT 10 07/05/2016: TSH 6.827 07/15/2016: BUN 21; Creatinine, Ser 0.66; Hemoglobin 12.8; Platelets 439; Potassium 3.9; Sodium 140   Lipid Panel    Component Value Date/Time   CHOL 133 04/30/2015 0853   TRIG 84 04/30/2015 0853   HDL 54 04/30/2015 0853   CHOLHDL 2.5 04/30/2015 0853   VLDL 17 04/30/2015 0853   LDLCALC 62 04/30/2015 0853   Most recent labs from PCP  TSH 3.42, creatinine 0.75, glucose 98, hemoglobin 14.4 Total cholesterol 135, HDL 47, LDL 60, triglycerides 141  ASSESSMENT:    1. Paroxysmal atrial fibrillation (HCC)   2. Long term current use of anticoagulant   3. Tachy-brady syndrome ([place])   4. Pacemaker   5. Orthostatic hypotension due to Parkinson's disease  ([place])   6. Hypercholesterolemia   7. Mild protein-calorie malnutrition ([place])      PLAN:  In order of problems listed above:  1. Afib: The overall burden of arrhythmia remains low and she is not symptomatic.  Has preserved left ventricular systolic function.  The side effects of rate control medications probably outweigh the benefit.  Will allow occasional episodes of rapid ventricular response. 2. Eliquis CHADSVasc 3 (age, gender) , but without previous embolic events.  No bleeding complications. 3. SSS: Is very sedentary because of her neurological problems.  Her symptoms are appropriately controlled with her current pacemaker settings. 4. PM: Normal device function.  Remote downloads every 3 months and yearly office visits 5. Orthostatic hypotension: Avoid any antihypertensive medications, stay well hydrated, continue fludrocortisone, avoid caffeine, alcohol and other natural diuretics. 6. HLP: On statin.  LDL at target. 7. Malnutrition: She is mildly underweight, but at least her weight loss has stopped worsening.    Medication Adjustments/Labs and Tests Ordered: Current medicines are reviewed at length with the patient today.  Concerns regarding medicines are outlined above.  Medication changes, Labs and Tests ordered today are listed in the Patient Instructions below. Patient Instructions  Dr Sallyanne Kuster recommends that you continue on your current medications as directed. Please refer to the Current Medication list given to you today.  Remote monitoring is used to monitor your Pacemaker or ICD from home. This monitoring reduces the number of office visits required to check your device to one time per year. It allows [place] to keep an eye on the functioning of your device to ensure it is working properly. You are scheduled for a device check from home on Thursday, April 11th, 2019. You may send your transmission at any time that day. If you have a wireless device, the transmission will be sent  automatically. After your physician reviews your transmission, you will receive a notification with your next transmission date.  Dr Sallyanne Kuster recommends that you schedule a follow-up appointment in 12 months with a pacemaker check. You will receive a reminder letter in the mail two months in advance. If you don't receive a letter, please call our office to schedule the follow-up appointment.  If you need a refill on your cardiac medications before your next appointment, please call your pharmacy.    Signed, [name], MD  03/22/2017 5:23 PM    [facility] [place], [place]  [address] Phone: [phone]; Fax: [phone]

## 2017-03-20 NOTE — Patient Instructions (Signed)

## 2017-04-03 DIAGNOSIS — Z85828 Personal history of other malignant neoplasm of skin: Secondary | ICD-10-CM | POA: Diagnosis not present

## 2017-04-03 DIAGNOSIS — Z08 Encounter for follow-up examination after completed treatment for malignant neoplasm: Secondary | ICD-10-CM | POA: Diagnosis not present

## 2017-04-03 DIAGNOSIS — I781 Nevus, non-neoplastic: Secondary | ICD-10-CM | POA: Diagnosis not present

## 2017-04-05 LAB — CUP PACEART INCLINIC DEVICE CHECK
Date Time Interrogation Session: 20190313112253
Implantable Lead Implant Date: 20130611
Implantable Lead Location: 753859
Implantable Lead Location: 753860
Implantable Lead Model: 5076
Implantable Lead Model: 5076
Lead Channel Setting Pacing Amplitude: 2 V
Lead Channel Setting Pacing Pulse Width: 0.4 ms
Lead Channel Setting Sensing Sensitivity: 5.6 mV
MDC IDC LEAD IMPLANT DT: 20130611
MDC IDC PG IMPLANT DT: 20130611
MDC IDC SET LEADCHNL RA PACING AMPLITUDE: 1.5 V

## 2017-04-06 ENCOUNTER — Ambulatory Visit: Payer: Medicare Other | Admitting: Physical Therapy

## 2017-04-06 ENCOUNTER — Ambulatory Visit: Payer: Medicare Other | Admitting: Occupational Therapy

## 2017-04-06 ENCOUNTER — Ambulatory Visit: Payer: Medicare Other

## 2017-04-13 ENCOUNTER — Other Ambulatory Visit: Payer: Self-pay | Admitting: *Deleted

## 2017-04-13 MED ORDER — FLUDROCORTISONE ACETATE 0.1 MG PO TABS: 100.0000 ug | ORAL_TABLET | Freq: Every day | ORAL | 3 refills | Status: DC

## 2017-04-13 NOTE — Telephone Encounter (Signed)
Rx has been sent to the pharmacy electronically. ° °

## 2017-04-14 NOTE — Progress Notes (Signed)
Abigail Taylor was seen today in the movement disorders clinic for neurologic consultation at the request of [name], MD.  The consultation is for the evaluation of PD.  The records that were made available to me were reviewed.  Pt previously has seen Dr. Merlene Laughter.  Unfortunately, his records are not present.  Reviewed PCP, cardiology and PT notes.  The first symptom(s) the patient noticed was R hand and foot tremor and this was 2 years.  She is currently on carbidopa/levodopa 25/100 three per day, at 7:30am/12:30pm/bedtime.  She has some trouble getting going in the AM.    04/18/17 update: Patient is seen today in follow-up.  Patient is now on carbidopa/levodopa 25/100 at 7 AM/11 AM/4 PM and we added carbidopa/levodopa 50/200 at bedtime.  This helped her ability to get up in the AM.  Records were reviewed since our last visit.  She did attend physical therapy at [facility].  she also attended ST and thinks that it is really helpful.  She saw her cardiologist on 03/20/2017 and those records are reviewed.  Orthostatic hypotension was addressed, but she is not on any medication for this.  She is having no dizziness. No falls.  No hallucinations.    PREVIOUS MEDICATIONS: none to date  ALLERGIES:  No Known Allergies  CURRENT MEDICATIONS:  Outpatient Encounter Medications as of 04/18/2017  Medication Sig  . acetaminophen (TYLENOL) 325 MG tablet Take 2 tablets (650 mg total) by mouth every 6 (six) hours as needed for mild pain (or Fever >/= 101).  . carbidopa-levodopa (SINEMET CR) 50-200 MG tablet Take 1 tablet by mouth at bedtime.  . carbidopa-levodopa (SINEMET IR) 25-100 MG per tablet Take 1 tablet by mouth 3 (three) times daily.   Marland Kitchen ELIQUIS 2.5 MG TABS tablet TAKE (1) TABLET BY MOUTH TWICE DAILY.  . fludrocortisone (FLORINEF) 0.1 MG tablet Take 1 tablet (100 mcg total) by mouth daily.  Marland Kitchen levothyroxine (SYNTHROID, LEVOTHROID) 75 MCG tablet Take 75 mcg by mouth daily before breakfast.  .  rosuvastatin (CRESTOR) 10 MG tablet Take 10 mg by mouth at bedtime. Reported on 07/09/2015  . senna-docusate (SENOKOT-S) 8.6-50 MG tablet Take 1 tablet by mouth at bedtime as needed for mild constipation.  Marland Kitchen XIIDRA 5 % SOLN    No facility-administered encounter medications on file as of 04/18/2017.     PAST MEDICAL HISTORY:   Past Medical History:  Diagnosis Date  . Angina at rest, with the tachycardia 07/05/2011  . Aortic insufficiency 07/05/2011  . Atrial fibrillation ([place])   . Brady-tachy syndrome ([place])    Dual chamber Medtronic pacemaker Adapta  . Chronic back pain   . Constipation 12/27/2011  . Gait instability   . Hyperlipidemia   . Hypothyroidism   . Normal cardiac stress test 2013  . OA (osteoarthritis)   . Pacemaker   . Parkinson's disease ([place])   . Tremor     PAST SURGICAL HISTORY:   Past Surgical History:  Procedure Laterality Date  . ABDOMINAL HYSTERECTOMY    . CARDIAC CATHETERIZATION  05/26/1999   normal  . CARDIAC CATHETERIZATION  2001   normal coronary arteries  . cataract    . COLONOSCOPY N/A 11/15/2012   Procedure: COLONOSCOPY;  Surgeon: [name], MD;  [facility];  Service: Endoscopy;  Laterality: N/A;  1030  . HIP ARTHROPLASTY Left 06/24/2016   Procedure: ARTHROPLASTY BIPOLAR HIP (HEMIARTHROPLASTY);  Surgeon: Altamese Hartman, MD;  Location: [place] [facility];  Service: Orthopedics;  Laterality: Left;  .  INSERT / REPLACE / REMOVE PACEMAKER    . NM MYOVIEW LTD  07/19/2011   normal  . PERMANENT PACEMAKER INSERTION  07/05/2011   Medtronic Adapta dual chamber  . PERMANENT PACEMAKER INSERTION N/A 07/05/2011   Procedure: PERMANENT PACEMAKER INSERTION;  Surgeon: [name], MD;  Location: [facility];  Service: Cardiovascular;  Laterality: N/A;    SOCIAL HISTORY:   Social History   Socioeconomic History  . Marital status: Married    Spouse name: Not on file  . Number of children: Not on file  . Years of education: Not on file  . Highest education  level: Not on file  Occupational History  . 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 Financial resource strain: Not on file  . Food insecurity:    Worry: Not on file    Inability: Not on file  . Transportation needs:    Medical: Not on file    Non-medical: Not on file  Tobacco Use  . Smoking status: Never Smoker  . Smokeless tobacco: Never Used  Substance and Sexual Activity  . Alcohol use: No    Alcohol/week: 0.0 oz  . Drug use: No  . Sexual activity: Not on file  Lifestyle  . Physical activity:    Days per week: Not on file    Minutes per session: Not on file  . Stress: Not on file  Relationships  . Social connections:    Talks on phone: Not on file    Gets together: Not on file    Attends religious service: Not on file    Active member of club or organization: Not on file    Attends meetings of clubs or organizations: Not on file    Relationship status: Not on file  . Intimate partner violence:    Fear of current or ex partner: Not on file    Emotionally abused: Not on file    Physically abused: Not on file    Forced sexual activity: Not on file  Other Topics Concern  . Not on file  Social History Narrative  . Not on file    FAMILY HISTORY:   Family Status  Relation Name Status  . Mother  Deceased       MI, colon cancer at age 50  . Father  Deceased       Kidney cancer  . Sister 2 Deceased       etoh abuse, one had kidney cancer  . Brother 1 Alive       good health  . Son [employer]       good health  . Daughter 1 Alive       good health  . Son  Deceased    ROS:  A complete 10 system review of systems was obtained and was unremarkable apart from what is mentioned above.  PHYSICAL EXAMINATION:    VITALS:   Vitals:   04/18/17 1101  BP: (!) 142/90  Pulse: 76  SpO2: 95%  Weight: 122 lb (55.3 kg)  Height: 5\' 6"  (1.676 m)    GEN:  The patient appears stated age and is in NAD. HEENT:  Normocephalic, atraumatic.   The mucous membranes are moist. The superficial temporal arteries are without ropiness or tenderness. CV:  RRR Lungs:  CTAB Neck/HEME:  There are no carotid bruits bilaterally.  Neurological examination:  Orientation: The patient is alert and oriented x3. Fund of knowledge is appropriate.  Recent and  remote memory are intact.  Attention and concentration are normal.    Able to name objects and repeat phrases. Cranial nerves: There is good facial symmetry. There is facial hypomimia. The visual fields are full to confrontational testing. The speech is fluent and clear.  She is hypophonic.Soft palate rises symmetrically and there is no tongue deviation. Hearing is intact to conversational tone. Sensation: Sensation is intact to light touch throughout. Motor: Strength is at least antigravity x4.     Movement examination: Tone: No axial rigidity today.  Normal tone in the upper and lower extremity.   Abnormal movements: There is no tremor noted today. Coordination:  There is  decremation with RAM's, with any form of RAMS, including alternating supination and pronation of the forearm, hand opening and closing, finger taps, heel taps and toe taps, L more than R. Gait and Station: The patient has minimal difficulty arising out of a deep-seated chair without the use of the hands. The patient's stride length is normal with good arm swing and some camptocormia.  She is fairly steady today.  ASSESSMENT/PLAN:  1.  Idiopathic Parkinson's disease.  The patient has tremor, bradykinesia, rigidity and mild postural instability.  This was dx in approx 2016  -We discussed the diagnosis as well as pathophysiology of the disease.  We discussed treatment options as well as prognostic indicators.  Patient education was provided.  -We decided to continue carbidopa/levodopa 25/100  7 AM/11 AM/4 PM.  She can take it with a carbohydrate since it causes nausea.  Discussed interaction with protein.  We will add  carbidopa/levodopa 50/200 at bedtime since she has trouble with morning "on."  Risks, benefits, side effects and alternative therapies were discussed.  The opportunity to ask questions was given and they were answered to the best of my ability.  The patient expressed understanding and willingness to follow the outlined treatment protocols.  -Invited to Parkinson's symposium.  -Continue outpatient physical therapy exercises/speech therapy exercises.  2.  RBD  -Patient fell out of bed while asleep in May, 2018 and fractured her hip.  Talked about the fact that REM behavior disorder can obviously be very serious and result in hip fractures and bleeding in the brain.  This should not be ignored and requires treatment.  We talked about medical treatment.  We also talked about using bed rails.  She actually is doing this now and feels much more comfortable.  She really does not want to add an additional medication.  We talked about moving the bedside stand further away from the bed.  We talked about safety in the bedroom.  3.  Orthostatic hypotension  -on florinef and doing well.  She is asymptomatic.  4.  Sialorrhea  -This is commonly associated with PD.  We talked about treatments.  The patient is not a candidate for oral anticholinergic therapy because of increased risk of confusion and falls.  We discussed myobloc and 1% atropine drops.  We discusssed that candy like lemon drops can help by stimulating mm of the oropharynx to induce swallowing.  5.  Dry eye  -Seeing ophthalmology, but the drops are too expensive for her to afford.  She asked about other treatment.  Told her she needed to call her ophthalmologist  6.Follow up is anticipated in the next 4-5 months, sooner should new neurologic issues arise.   Cc:  [name], MD

## 2017-04-18 ENCOUNTER — Ambulatory Visit (INDEPENDENT_AMBULATORY_CARE_PROVIDER_SITE_OTHER): Payer: Medicare Other | Admitting: Neurology

## 2017-04-18 ENCOUNTER — Encounter: Payer: Self-pay | Admitting: Neurology

## 2017-04-18 VITALS — BP 142/90 | HR 76 | Ht 66.0 in | Wt 122.0 lb

## 2017-04-18 DIAGNOSIS — G20 Parkinson's disease: Secondary | ICD-10-CM | POA: Diagnosis not present

## 2017-04-18 DIAGNOSIS — H04129 Dry eye syndrome of unspecified lacrimal gland: Secondary | ICD-10-CM | POA: Diagnosis not present

## 2017-04-18 DIAGNOSIS — G903 Multi-system degeneration of the autonomic nervous system: Secondary | ICD-10-CM

## 2017-04-18 NOTE — Patient Instructions (Signed)
  [facility]'s [facility] Movement [facility]  The [facility] team know that living well with a movement disorder extends far beyond our clinic walls. We are together with you. Our team is passionate about providing resources to you and your loved ones who are living with Parkinson's disease and movement disorders. Participate in these programs and join our community. These resources are free or low cost!   [facility] is adding:   Innovative educational programs for patients and caregivers.   Support groups for patients and caregivers living with Parkinson's disease.   Parkinson's specific exercise programs.   Custom tailored therapeutic programs that will benefit patient's living with Parkinson's disease.   We are in this together. You can help and contribute to grow these programs and resources in our community. 100% of the funds donated to the Movement Disorders Fund stays right here in our community to support patients and their caregivers.  To make a tax deductible contribution:  -ask for a [facility] Parkinson's envelope in the office today.  - call the Office of Institutional Advancement at [phone].         

## 2017-04-25 DIAGNOSIS — I739 Peripheral vascular disease, unspecified: Secondary | ICD-10-CM | POA: Diagnosis not present

## 2017-04-27 DIAGNOSIS — H04123 Dry eye syndrome of bilateral lacrimal glands: Secondary | ICD-10-CM | POA: Diagnosis not present

## 2017-05-04 ENCOUNTER — Ambulatory Visit (INDEPENDENT_AMBULATORY_CARE_PROVIDER_SITE_OTHER): Payer: Medicare Other | Admitting: *Deleted

## 2017-05-04 ENCOUNTER — Telehealth: Payer: Self-pay | Admitting: Cardiology

## 2017-05-04 DIAGNOSIS — I495 Sick sinus syndrome: Secondary | ICD-10-CM | POA: Diagnosis not present

## 2017-05-04 NOTE — Telephone Encounter (Signed)
LMOVM reminding pt to send remote transmission.   

## 2017-05-04 NOTE — Progress Notes (Signed)
Remote pacemaker transmission.   

## 2017-05-10 ENCOUNTER — Encounter: Payer: Self-pay | Admitting: Cardiology

## 2017-06-09 LAB — CUP PACEART REMOTE DEVICE CHECK
Battery Impedance: 353 Ohm
Brady Statistic AP VS Percent: 25 %
Brady Statistic AS VP Percent: 0 %
Date Time Interrogation Session: 20190411194140
Implantable Lead Implant Date: 20130611
Implantable Lead Location: 753859
Implantable Lead Model: 5076
Implantable Pulse Generator Implant Date: 20130611
Lead Channel Impedance Value: 547 Ohm
Lead Channel Pacing Threshold Amplitude: 0.75 V
Lead Channel Pacing Threshold Pulse Width: 0.4 ms
Lead Channel Setting Pacing Amplitude: 2 V
Lead Channel Setting Sensing Sensitivity: 5.6 mV
MDC IDC LEAD IMPLANT DT: 20130611
MDC IDC LEAD LOCATION: 753860
MDC IDC MSMT BATTERY REMAINING LONGEVITY: 123 mo
MDC IDC MSMT BATTERY VOLTAGE: 2.79 V
MDC IDC MSMT LEADCHNL RA IMPEDANCE VALUE: 442 Ohm
MDC IDC MSMT LEADCHNL RA PACING THRESHOLD AMPLITUDE: 0.75 V
MDC IDC MSMT LEADCHNL RA PACING THRESHOLD PULSEWIDTH: 0.4 ms
MDC IDC SET LEADCHNL RA PACING AMPLITUDE: 1.5 V
MDC IDC SET LEADCHNL RV PACING PULSEWIDTH: 0.4 ms
MDC IDC STAT BRADY AP VP PERCENT: 0 %
MDC IDC STAT BRADY AS VS PERCENT: 75 %

## 2017-07-07 DIAGNOSIS — R222 Localized swelling, mass and lump, trunk: Secondary | ICD-10-CM | POA: Diagnosis not present

## 2017-07-07 DIAGNOSIS — E782 Mixed hyperlipidemia: Secondary | ICD-10-CM | POA: Diagnosis not present

## 2017-07-07 DIAGNOSIS — Z681 Body mass index (BMI) 19 or less, adult: Secondary | ICD-10-CM | POA: Diagnosis not present

## 2017-07-07 DIAGNOSIS — Z1389 Encounter for screening for other disorder: Secondary | ICD-10-CM | POA: Diagnosis not present

## 2017-07-07 DIAGNOSIS — I10 Essential (primary) hypertension: Secondary | ICD-10-CM | POA: Diagnosis not present

## 2017-07-07 DIAGNOSIS — E063 Autoimmune thyroiditis: Secondary | ICD-10-CM | POA: Diagnosis not present

## 2017-07-07 DIAGNOSIS — M1991 Primary osteoarthritis, unspecified site: Secondary | ICD-10-CM | POA: Diagnosis not present

## 2017-07-10 DIAGNOSIS — I781 Nevus, non-neoplastic: Secondary | ICD-10-CM | POA: Diagnosis not present

## 2017-07-11 DIAGNOSIS — I739 Peripheral vascular disease, unspecified: Secondary | ICD-10-CM | POA: Diagnosis not present

## 2017-07-18 ENCOUNTER — Other Ambulatory Visit: Payer: Self-pay | Admitting: Neurology

## 2017-07-23 ENCOUNTER — Other Ambulatory Visit: Payer: Self-pay | Admitting: Cardiovascular Disease

## 2017-08-03 ENCOUNTER — Ambulatory Visit (INDEPENDENT_AMBULATORY_CARE_PROVIDER_SITE_OTHER): Payer: Medicare Other | Admitting: *Deleted

## 2017-08-03 DIAGNOSIS — Z681 Body mass index (BMI) 19 or less, adult: Secondary | ICD-10-CM | POA: Diagnosis not present

## 2017-08-03 DIAGNOSIS — I495 Sick sinus syndrome: Secondary | ICD-10-CM

## 2017-08-03 DIAGNOSIS — Z1389 Encounter for screening for other disorder: Secondary | ICD-10-CM | POA: Diagnosis not present

## 2017-08-03 DIAGNOSIS — Z0001 Encounter for general adult medical examination with abnormal findings: Secondary | ICD-10-CM | POA: Diagnosis not present

## 2017-08-03 NOTE — Progress Notes (Signed)
Remote pacemaker transmission.   

## 2017-09-03 LAB — CUP PACEART REMOTE DEVICE CHECK
Battery Impedance: 377 Ohm
Battery Remaining Longevity: 120 mo
Battery Voltage: 2.79 V
Brady Statistic AP VP Percent: 0 %
Brady Statistic AP VS Percent: 28 %
Brady Statistic AS VP Percent: 0 %
Implantable Lead Implant Date: 20130611
Implantable Lead Location: 753860
Implantable Lead Model: 5076
Implantable Lead Model: 5076
Implantable Pulse Generator Implant Date: 20130611
Lead Channel Impedance Value: 461 Ohm
Lead Channel Pacing Threshold Amplitude: 0.625 V
Lead Channel Pacing Threshold Pulse Width: 0.4 ms
Lead Channel Setting Pacing Amplitude: 1.5 V
Lead Channel Setting Pacing Amplitude: 2 V
Lead Channel Setting Pacing Pulse Width: 0.46 ms
MDC IDC LEAD IMPLANT DT: 20130611
MDC IDC LEAD LOCATION: 753859
MDC IDC MSMT LEADCHNL RV IMPEDANCE VALUE: 628 Ohm
MDC IDC MSMT LEADCHNL RV PACING THRESHOLD AMPLITUDE: 0.625 V
MDC IDC MSMT LEADCHNL RV PACING THRESHOLD PULSEWIDTH: 0.4 ms
MDC IDC SESS DTM: 20190711143315
MDC IDC SET LEADCHNL RV SENSING SENSITIVITY: 5.6 mV
MDC IDC STAT BRADY AS VS PERCENT: 72 %

## 2017-09-04 NOTE — Progress Notes (Signed)
Abigail Taylor was seen today in the movement disorders clinic for neurologic consultation at the request of [name], MD.  The consultation is for the evaluation of PD.  The records that were made available to me were reviewed.  Pt previously has seen Dr. Merlene Laughter.  Unfortunately, his records are not present.  Reviewed PCP, cardiology and PT notes.  The first symptom(s) the patient noticed was R hand and foot tremor and this was 2 years.  She is currently on carbidopa/levodopa 25/100 three per day, at 7:30am/12:30pm/bedtime.  She has some trouble getting going in the AM.    04/18/17 update: Patient is seen today in follow-up.  Patient is now on carbidopa/levodopa 25/100 at 7 AM/11 AM/4 PM and we added carbidopa/levodopa 50/200 at bedtime.  This helped her ability to get up in the AM.  Records were reviewed since our last visit.  She did attend physical therapy at [facility].  she also attended ST and thinks that it is really helpful.  She saw her cardiologist on 03/20/2017 and those records are reviewed.  Orthostatic hypotension was addressed, but she is not on any medication for this.  She is having no dizziness. No falls.  No hallucinations.   09/06/17 update: Patient is seen today in follow-up for Parkinson's disease.  She is on carbidopa/levodopa 25/100, 1 tablet 3 times per day and carbidopa/levodopa 50/200 at bedtime.  Pt denies falls.  Pt denies lightheadedness, near syncope.  No hallucinations.  Mood has been good.  She isn't exercising but "I should."  She has decreased appetite.  Weight has been stable.  "I don't drink enough liquids."  She is drinking 1 boost per day.  Voice is a bit weaker.    PREVIOUS MEDICATIONS: none to date  ALLERGIES:  No Known Allergies  CURRENT MEDICATIONS:  Outpatient Encounter Medications as of 09/06/2017  Medication Sig  . acetaminophen (TYLENOL) 325 MG tablet Take 2 tablets (650 mg total) by mouth every 6 (six) hours as needed for mild pain (or Fever  >/= 101).  . carbidopa-levodopa (SINEMET CR) 50-200 MG tablet TAKE 1 TABLET BY MOUTH AT BEDTIME  . carbidopa-levodopa (SINEMET IR) 25-100 MG per tablet Take 1 tablet by mouth 3 (three) times daily.   Marland Kitchen ELIQUIS 2.5 MG TABS tablet TAKE 1 TABLET BY MOUTH TWICE A DAY  . fludrocortisone (FLORINEF) 0.1 MG tablet Take 1 tablet (100 mcg total) by mouth daily.  Marland Kitchen levothyroxine (SYNTHROID, LEVOTHROID) 75 MCG tablet Take 75 mcg by mouth daily before breakfast.  . rosuvastatin (CRESTOR) 10 MG tablet Take 10 mg by mouth at bedtime. Reported on 07/09/2015  . senna-docusate (SENOKOT-S) 8.6-50 MG tablet Take 1 tablet by mouth at bedtime as needed for mild constipation.  Marland Kitchen XIIDRA 5 % SOLN    No facility-administered encounter medications on file as of 09/06/2017.     PAST MEDICAL HISTORY:   Past Medical History:  Diagnosis Date  . Angina at rest, with the tachycardia 07/05/2011  . Aortic insufficiency 07/05/2011  . Atrial fibrillation ([place])   . Brady-tachy syndrome ([place])    Dual chamber Medtronic pacemaker Adapta  . Chronic back pain   . Constipation 12/27/2011  . Gait instability   . Hyperlipidemia   . Hypothyroidism   . Normal cardiac stress test 2013  . OA (osteoarthritis)   . Pacemaker   . Parkinson's disease ([place])   . Tremor     PAST SURGICAL HISTORY:   Past Surgical History:  Procedure Laterality Date  .  ABDOMINAL HYSTERECTOMY    . CARDIAC CATHETERIZATION  05/26/1999   normal  . CARDIAC CATHETERIZATION  2001   normal coronary arteries  . cataract    . COLONOSCOPY N/A 11/15/2012   Procedure: COLONOSCOPY;  Surgeon: [name], MD;  [facility];  Service: Endoscopy;  Laterality: N/A;  1030  . HIP ARTHROPLASTY Left 06/24/2016   Procedure: ARTHROPLASTY BIPOLAR HIP (HEMIARTHROPLASTY);  Surgeon: Altamese [place]er [place], MD;  Location: [place];  Service: Orthopedics;  Laterality: Left;  . INSERT / REPLACE / REMOVE PACEMAKER    . NM MYOVIEW LTD  07/19/2011   normal  . PERMANENT PACEMAKER  INSERTION  07/05/2011   Medtronic Adapta dual chamber  . PERMANENT PACEMAKER INSERTION N/A 07/05/2011   Procedure: PERMANENT PACEMAKER INSERTION;  Surgeon: [name], MD;  Location: [facility];  Service: Cardiovascular;  Laterality: N/A;    SOCIAL HISTORY:   Social History   Socioeconomic History  . Marital status: Married    Spouse name: Not on file  . Number of children: Not on file  . Years of education: Not on file  . Highest education level: Not on file  Occupational History  . 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 Financial resource strain: Not on file  . Food insecurity:    Worry: Not on file    Inability: Not on file  . Transportation needs:    Medical: Not on file    Non-medical: Not on file  Tobacco Use  . Smoking status: Never Smoker  . Smokeless tobacco: Never Used  Substance and Sexual Activity  . Alcohol use: No    Alcohol/week: 0.0 standard drinks  . Drug use: No  . Sexual activity: Not on file  Lifestyle  . Physical activity:    Days per week: Not on file    Minutes per session: Not on file  . Stress: Not on file  Relationships  . Social connections:    Talks on phone: Not on file    Gets together: Not on file    Attends religious service: Not on file    Active member of club or organization: Not on file    Attends meetings of clubs or organizations: Not on file    Relationship status: Not on file  . Intimate partner violence:    Fear of current or ex partner: Not on file    Emotionally abused: Not on file    Physically abused: Not on file    Forced sexual activity: Not on file  Other Topics Concern  . Not on file  Social History Narrative  . Not on file    FAMILY HISTORY:   Family Status  Relation Name Status  . Mother  Deceased       MI, colon cancer at age 28  . Father  Deceased       Kidney cancer  . Sister 2 Deceased       etoh abuse, one had kidney cancer  . Brother 1 Alive       good  health  . Son [employer]       good health  . Daughter 1 Alive       good health  . Son  Deceased    ROS:  A complete 10 system review of systems was obtained and was unremarkable apart from what is mentioned above.  PHYSICAL EXAMINATION:    VITALS:   Vitals:   09/06/17 1057  BP: 118/72  Pulse: 80  SpO2: 96%  Weight: 122 lb (55.3 kg)  Height: 5\' 6"  (1.676 m)   Wt Readings from Last 3 Encounters:  09/06/17 122 lb (55.3 kg)  04/18/17 122 lb (55.3 kg)  03/20/17 122 lb 6.4 oz (55.5 kg)    GEN:  The patient appears stated age and is in NAD. HEENT:  Normocephalic, atraumatic.  The mucous membranes are moist. The superficial temporal arteries are without ropiness or tenderness. CV:  RRR Lungs:  CTAB Neck/HEME:  There are no carotid bruits bilaterally.  Neurological examination:  Orientation: The patient is alert and oriented x3. Cranial nerves: There is good facial symmetry. The speech is fluent and clear. Soft palate rises symmetrically and there is no tongue deviation. Hearing is intact to conversational tone. Sensation: Sensation is intact to light touch throughout Motor: Strength is at least antigravity x 4   Movement examination: Tone: No axial rigidity today.  there is RUE mild rigidity Abnormal movements: There is no tremor noted today. Coordination:  There is decremation, with any form of RAMS, including alternating supination and pronation of the forearm, hand opening and closing, finger taps, heel taps and toe taps bilaterally.   Gait and Station: The patient has minimal difficulty arising out of a deep-seated chair without the use of the hands. The patient's stride length is normal with good arm swing and some camptocormia.  She is fairly steady today.  ASSESSMENT/PLAN:  1.  Idiopathic Parkinson's disease.  The patient has tremor, bradykinesia, rigidity and mild postural instability.  This was dx in approx 2016  -We discussed the diagnosis as well as pathophysiology  of the disease.  We discussed treatment options as well as prognostic indicators.  Patient education was provided.  -increase carbidopa/levodopa 25/100, 2/1/1.  May need a higher dosage  -Continue carbidopa/levodopa 50/200 at bedtime.  -restart PT/ST  -encouraged exercise at home.  2.  RBD  -Patient fell out of bed while asleep in May, 2018 and fractured her hip.  Talked about the fact that REM behavior disorder can obviously be very serious and result in hip fractures and bleeding in the brain.  This should not be ignored and requires treatment.  No further symptoms and has bed rails.  3.  Orthostatic hypotension  -on florinef and doing well.  She is asymptomatic.  -encouraged hydration  4.  Sialorrhea  -This is commonly associated with PD.  We talked about treatments.  The patient is not a candidate for oral anticholinergic therapy because of increased risk of confusion and falls.  We discussed myobloc and 1% atropine drops.  We discusssed that candy like lemon drops can help by stimulating mm of the oropharynx to induce swallowing.  5.  Dry eye  -Seeing ophthalmology, but the drops are too expensive for her to afford.  She asked about other treatment.  Told her she needed to call her ophthalmologist  6.constipation  -rancho recipe given  7.  Follow up is anticipated in the next few months, sooner should new neurologic issues arise.  Much greater than 50% of this visit was spent in counseling and coordinating care.  Total face to face time:  25 min   Cc:  [name], MD

## 2017-09-06 ENCOUNTER — Ambulatory Visit (INDEPENDENT_AMBULATORY_CARE_PROVIDER_SITE_OTHER): Payer: Medicare Other | Admitting: Neurology

## 2017-09-06 ENCOUNTER — Encounter: Payer: Self-pay | Admitting: Neurology

## 2017-09-06 VITALS — BP 118/72 | HR 80 | Ht 66.0 in | Wt 122.0 lb

## 2017-09-06 DIAGNOSIS — K5901 Slow transit constipation: Secondary | ICD-10-CM

## 2017-09-06 DIAGNOSIS — G20 Parkinson's disease: Secondary | ICD-10-CM

## 2017-09-06 NOTE — Patient Instructions (Addendum)
1. Constipation and Parkinson's disease:   Rancho recipe for constipation in Parkinsons Disease:  -1 cup of bran, 2 cups of applesauce in 1 cup of prune juice  Increase fiber intake (Metamucil,vegetables)  Regular, moderate exercise can be beneficial.  Avoid medications causing constipation, such as medications like antacids with calcium or magnesium  Laxative overuse should be avoided.  Stool softeners (Colace) can help with chronic constipation.  2.  Increase carbidopa/levodopa 25/100 to 2 tablets in the AM, 1 in the afternoon, and 1 in early evening.    3.  Continue carbidopa/levodopa 50/200 at bedtime  4.  We will send an order for speech and physical therapy to [facility]

## 2017-09-19 ENCOUNTER — Encounter (HOSPITAL_COMMUNITY): Payer: Self-pay | Admitting: Speech Pathology

## 2017-09-19 ENCOUNTER — Other Ambulatory Visit: Payer: Self-pay

## 2017-09-19 ENCOUNTER — Ambulatory Visit (HOSPITAL_COMMUNITY): Payer: Medicare Other | Attending: Neurology | Admitting: Speech Pathology

## 2017-09-19 DIAGNOSIS — R2689 Other abnormalities of gait and mobility: Secondary | ICD-10-CM | POA: Insufficient documentation

## 2017-09-19 DIAGNOSIS — R2681 Unsteadiness on feet: Secondary | ICD-10-CM | POA: Diagnosis not present

## 2017-09-19 DIAGNOSIS — R471 Dysarthria and anarthria: Secondary | ICD-10-CM | POA: Insufficient documentation

## 2017-09-19 NOTE — Therapy (Signed)
[place], [place], [address] Phone: [phone]   Fax:  [phone]  Speech Language Pathology Evaluation  Patient Details  Name: Abigail Taylor MRN: [id number] Date of Birth: 07/10/1931 Referring Provider: Alonza Bogus, DO   Encounter Date: 09/19/2017  End of Session - 09/19/17 1858    Visit Number  1    Number of Visits  5    Date for SLP Re-Evaluation  10/26/17    Authorization Type  Medicare and Generic Commercial, no auth based on medical necessity    SLP Start Time  1515    SLP Stop Time   1600    SLP Time Calculation (min)  45 min    Activity Tolerance  Patient tolerated treatment well       Past Medical History:  Diagnosis Date  . Angina at rest, with the tachycardia 07/05/2011  . Aortic insufficiency 07/05/2011  . Atrial fibrillation ([place])   . Brady-tachy syndrome ([place])    Dual chamber Medtronic pacemaker Adapta  . Chronic back pain   . Constipation 12/27/2011  . Gait instability   . Hyperlipidemia   . Hypothyroidism   . Normal cardiac stress test 2013  . OA (osteoarthritis)   . Pacemaker   . Parkinson's disease ([place])   . Tremor     Past Surgical History:  Procedure Laterality Date  . ABDOMINAL HYSTERECTOMY    . CARDIAC CATHETERIZATION  05/26/1999   normal  . CARDIAC CATHETERIZATION  2001   normal coronary arteries  . cataract    . COLONOSCOPY N/A 11/15/2012   Procedure: COLONOSCOPY;  Surgeon: [name], MD;  [facility];  Service: Endoscopy;  Laterality: N/A;  1030  . HIP ARTHROPLASTY Left 06/24/2016   Procedure: ARTHROPLASTY BIPOLAR HIP (HEMIARTHROPLASTY);  Surgeon: Altamese Claypool, MD;  Location: [facility];  Service: Orthopedics;  Laterality: Left;  . INSERT / REPLACE / REMOVE PACEMAKER    . NM MYOVIEW LTD  07/19/2011   normal  . PERMANENT PACEMAKER INSERTION  07/05/2011   Medtronic Adapta dual chamber  . PERMANENT PACEMAKER INSERTION N/A 07/05/2011   Procedure: PERMANENT  PACEMAKER INSERTION;  Surgeon: [name], MD;  Location: [facility];  Service: Cardiovascular;  Laterality: N/A;    There were no vitals filed for this visit.  Subjective Assessment - 09/19/17 1852    Subjective  "Dr. Carles Collet said I need to come back to therapy, but I think I am doing ok."    Currently in Pain?  No/denies         SLP Evaluation [facility] - 09/19/17 1852      SLP Visit Information   SLP Received On  09/19/17    Referring Provider  Alonza Bogus, DO    Onset Date  2016    Medical Diagnosis  Parkinson's disease      General Information   HPI  Abigail Taylor is an 84 yo woman who is known to SLP outpatient service from previous therapy (January 2019). She is referred for SLP therapy by Dr. Wells Guiles Tat, her movement disorders neurologist.     Behavioral/Cognition  alert and cooperative    Mobility Status  ambulatory      Balance Screen   Has the patient fallen in the past 6 months  No    Has the patient had a decrease in activity level because of a fear of falling?   No    Is the patient reluctant to leave their  home because of a fear of falling?   No      Prior Functional Status   Cognitive/Linguistic Baseline  Within functional limits    Type of Home  House     Lives With  Spouse    Available Support  Family    Vocation  Retired      Associate Professor   Overall Cognitive Status  Within Functional Limits for tasks assessed      Auditory Comprehension   Overall Auditory Comprehension  Appears within functional limits for tasks assessed      Visual Recognition/Discrimination   Discrimination  Not tested      Reading Comprehension   Reading Status  Not tested      Expression   Primary Mode of Expression  Verbal      Verbal Expression   Overall Verbal Expression  Appears within functional limits for tasks assessed    Initiation  No impairment    Automatic Speech  Name;Social Response;Counting    Level of Generative/Spontaneous Verbalization  Conversation     Repetition  No impairment    Naming  No impairment    Pragmatics  No impairment    Interfering Components  Speech intelligibility    Non-Verbal Means of Communication  Not applicable      Oral Motor/Sensory Function   Overall Oral Motor/Sensory Function  Appears within functional limits for tasks assessed    Labial ROM  Within Functional Limits    Labial Symmetry  Within Functional Limits    Labial Strength  Within Functional Limits    Labial Sensation  Within Functional Limits    Labial Coordination  WFL    Lingual ROM  Within Functional Limits    Lingual Symmetry  Within Functional Limits    Lingual Strength  Within Functional Limits    Lingual Sensation  Within Functional Limits    Lingual Coordination  WFL    Facial ROM  Within Functional Limits    Facial Symmetry  Within Functional Limits    Facial Strength  Within Functional Limits    Facial Sensation  Within Functional Limits    Facial Coordination  WFL    Velum  Within Functional Limits    Mandible  Within Functional Limits      Motor Speech   Overall Motor Speech  Impaired    Respiration  Within functional limits    Phonation  Hoarse    Resonance  Within functional limits    Articulation  Within functional limitis    Intelligibility  Intelligibility reduced    Word  75-100% accurate    Phrase  75-100% accurate    Sentence  75-100% accurate    Conversation  75-100% accurate    Motor Planning  Witnin functional limits    Effective Techniques  Increased vocal intensity    Phonation  Impaired    Vocal Abuses  Habitual Cough/Throat Clear    Volume  Soft    Pitch  High        SLP Short Term Goals - 09/19/17 1902      SLP SHORT TERM GOAL #1   Title  The patient will use a strong, clear voice when communicating with family, friends and/or colleagues resulting in an 80% reduction in requests for repetition.    Time  4    Period  Weeks    Status  New      SLP SHORT TERM GOAL #2   Title  Regularly practice voice  building/strengthening exercises a  minimum of 5 days/week for a 20+ minutes a day.    Baseline  Inconsistent completion at home    Time  4    Period  Weeks    Status  New       SLP Long Term Goals - 09/19/17 1902      SLP LONG TERM GOAL #1   Title  Same as short       Plan - 09/19/17 1900    Clinical Impression Statement  Pt presents with mild hypokinetic dysarthria with reduced vocal intensity and variable imprecise articulation at the conversation level. Pt reports fluctuations in vocal intensity and admits to not completing previously assigned HEP on a daily/weekly basis. Pt reports sialorrhea and her MD is aware. Pt denies dysphagia and was observed with thin liquids during our evaluation. She previously was able to sustain /a/ from 10-24 seconds last treatment period and today sustained for an average of 15.9 seconds. Pt noted to clear her throat frequently during our visit. Recommend short term dysarthria therapy with a focus on safely increasing vocal intensity with LSVT LOUD and Speak Out! Principles and resonant voice therapy.    Speech Therapy Frequency  1x /week    Duration  4 weeks    Treatment/Interventions  Cueing hierarchy;SLP instruction and feedback;Compensatory techniques;Compensatory strategies;Internal/external aids;Patient/family education    Potential to Achieve Goals  Good    Potential Considerations  Ability to learn/carryover information    SLP Home Exercise Plan  Pt will complete HEP as assigned to facilitate carryover of treatment strategies and techniques in home environment.    Consulted and Agree with Plan of Care  Patient       Patient will benefit from skilled therapeutic intervention in order to improve the following deficits and impairments:   Dysarthria and anarthria    Problem List Patient Active Problem List   Diagnosis Date Noted  . RBD (REM behavioral disorder) 01/10/2017  . Neurogenic orthostatic hypotension ([place]) 01/10/2017  . Tachy-brady  syndrome ([place]) 06/23/2016  . Closed subcapital fracture of left femur ([place]) 06/23/2016  . Orthostatic hypotension due to Parkinson's disease ([place]) 06/23/2016  . PD (Parkinson's disease) ([place]) 04/21/2016  . Hypokalemia 04/21/2016  . Normal coronary arteries 2001 05/08/2014  . Pacemaker 08/10/2012  . Aortic insufficiency 08/10/2012  . Constipation 12/27/2011  . Family hx of colon cancer 12/27/2011  . HTN (hypertension) 07/06/2011  . Hypothyroidism 07/05/2011  . Paroxysmal atrial fibrillation ([place]) 07/05/2011  . Sinus pause, 7 seconds post conversion, S/P MDT pacemaker 07/05/11 07/05/2011  . Dyslipidemia 07/05/2011  . Angina at rest, with rapid AF 07/05/2011   Thank you,  Genene Churn, [place]  [facility] 09/19/2017, 7:03 PM  [facility] [address] [place], [place], [address] Phone: [phone]   Fax:  [phone]  Name: Abigail Taylor MRN: [id number] Date of Birth: 03/12/1931

## 2017-09-21 ENCOUNTER — Ambulatory Visit (HOSPITAL_COMMUNITY): Payer: Medicare Other

## 2017-09-21 ENCOUNTER — Other Ambulatory Visit: Payer: Self-pay

## 2017-09-21 ENCOUNTER — Encounter (HOSPITAL_COMMUNITY): Payer: Self-pay

## 2017-09-21 DIAGNOSIS — R2689 Other abnormalities of gait and mobility: Secondary | ICD-10-CM

## 2017-09-21 DIAGNOSIS — R2681 Unsteadiness on feet: Secondary | ICD-10-CM | POA: Diagnosis not present

## 2017-09-21 DIAGNOSIS — R471 Dysarthria and anarthria: Secondary | ICD-10-CM | POA: Diagnosis not present

## 2017-09-21 NOTE — Therapy (Signed)
[place], [place], [address] Phone: [phone]   Fax:  [phone]  Physical Therapy Evaluation  Patient Details  Name: Abigail Taylor MRN: [id number] Date of Birth: 05/09/1931 Referring Provider: Alonza Bogus, DO   Encounter Date: 09/21/2017  PT End of Session - 09/21/17 1150    Visit Number  1    Number of Visits  5    Date for PT Re-Evaluation  10/19/17    Authorization Type  Medicare (secondary: generic commercial)    Authorization Time Period  09/21/17 to 10/19/17    Authorization - Visit Number  1    Authorization - Number of Visits  10    PT Start Time  0907    PT Stop Time  0943    PT Time Calculation (min)  36 min    Equipment Utilized During Treatment  Gait belt    Activity Tolerance  Patient tolerated treatment well;No increased pain    Behavior During Therapy  WFL for tasks assessed/performed       Past Medical History:  Diagnosis Date  . Angina at rest, with the tachycardia 07/05/2011  . Aortic insufficiency 07/05/2011  . Atrial fibrillation ([place])   . Brady-tachy syndrome ([place])    Dual chamber Medtronic pacemaker Adapta  . Chronic back pain   . Constipation 12/27/2011  . Gait instability   . Hyperlipidemia   . Hypothyroidism   . Normal cardiac stress test 2013  . OA (osteoarthritis)   . Pacemaker   . Parkinson's disease ([place])   . Tremor     Past Surgical History:  Procedure Laterality Date  . ABDOMINAL HYSTERECTOMY    . CARDIAC CATHETERIZATION  05/26/1999   normal  . CARDIAC CATHETERIZATION  2001   normal coronary arteries  . cataract    . COLONOSCOPY N/A 11/15/2012   Procedure: COLONOSCOPY;  Surgeon: [name], MD;  [facility];  Service: Endoscopy;  Laterality: N/A;  1030  . HIP ARTHROPLASTY Left 06/24/2016   Procedure: ARTHROPLASTY BIPOLAR HIP (HEMIARTHROPLASTY);  Surgeon: Altamese [place], MD;  Location: [facility];  Service: Orthopedics;  Laterality: Left;  . INSERT /  REPLACE / REMOVE PACEMAKER    . NM MYOVIEW LTD  07/19/2011   normal  . PERMANENT PACEMAKER INSERTION  07/05/2011   Medtronic Adapta dual chamber  . PERMANENT PACEMAKER INSERTION N/A 07/05/2011   Procedure: PERMANENT PACEMAKER INSERTION;  Surgeon: [name], MD;  Location: [facility];  Service: Cardiovascular;  Laterality: N/A;    There were no vitals filed for this visit.   Subjective Assessment - 09/21/17 0909    Subjective  Pt reports that Dr. Carles Collet has referred her back to physical therapy after following up with her 2WA. She reports she has continued her HEP since her discharge earlier this year. She feels like her balance is okay, but she has noticed that it is off. She has not had any falls or close calls. She states that getting up out of her chair and if she looks around when walking her balance gets off. She reports that it's still mainly her R side affected by her Parkinson's disease. She has h/o L THA and if she walks for a pretty good bit her hip will bother her.     Limitations  Walking;Standing;House hold activities    How long can you sit comfortably?  no issues    How long can you stand comfortably?  no real issues, just the  standing up from sitting from a couch or soft chair is difficult due to her back pain    How long can you walk comfortably?  10 mins    Patient Stated Goals  be able to walk better    Currently in Pain?  No/denies         [facility] PT Assessment - 09/21/17 0001      Assessment   Medical Diagnosis  Parkinson's Disease    Referring Provider  Alonza Bogus, DO    Next MD Visit  6 months    Prior Therapy  yes for L THA (d/c'd 08/2016), and Parkinson's earlier this year      Balance Screen   Has the patient fallen in the past 6 months  No    Has the patient had a decrease in activity level because of a fear of falling?   No    Is the patient reluctant to leave their home because of a fear of falling?   No      Cognition   Overall Cognitive Status   Within Functional Limits for tasks assessed      Coordination   Heel Shin Test  WNL; only limited due to slight weakness, not coordination    9 Hole Peg Test  RAMPs bil ankles: WNL      Functional Tests   Functional tests  Sit to Stand      Sit to Stand   Comments  30 sec chair rise test: 11x       ROM / Strength   AROM / PROM / Strength  Strength      Strength   Strength Assessment Site  Hip;Knee;Ankle    Right Hip Flexion  5/5    Right Hip Extension  3+/5    Right Hip ABduction  4+/5    Left Hip Flexion  5/5    Left Hip Extension  3+/5    Left Hip ABduction  4+/5    Right Knee Flexion  5/5    Right Knee Extension  5/5    Left Knee Flexion  5/5    Left Knee Extension  5/5    Right Ankle Dorsiflexion  5/5    Left Ankle Dorsiflexion  5/5      Balance   Balance Assessed  Yes      Static Standing Balance   Static Standing - Balance Support  No upper extremity supported    Static Standing Balance -  Activities   Single Leg Stance - Right Leg;Single Leg Stance - Left Leg    Static Standing - Comment/# of Minutes  R: 12 sec or < L: 10.3 sec or <      Standardized Balance Assessment   Standardized Balance Assessment  --      Functional Gait  Assessment   Gait assessed   Yes    Gait Level Surface  Walks 20 ft in less than 5.5 sec, no assistive devices, good speed, no evidence for imbalance, normal gait pattern, deviates no more than 6 in outside of the 12 in walkway width.    Change in Gait Speed  Able to change speed, demonstrates mild gait deviations, deviates 6-10 in outside of the 12 in walkway width, or no gait deviations, unable to achieve a major change in velocity, or uses a change in velocity, or uses an assistive device.    Gait with Horizontal Head Turns  Performs head turns smoothly with slight change in gait velocity (eg, minor disruption  to smooth gait path), deviates 6-10 in outside 12 in walkway width, or uses an assistive device.    Gait with Vertical Head Turns   Performs task with slight change in gait velocity (eg, minor disruption to smooth gait path), deviates 6 - 10 in outside 12 in walkway width or uses assistive device    Gait and Pivot Turn  Pivot turns safely within 3 sec and stops quickly with no loss of balance.    Step Over Obstacle  Is able to step over one shoe box (4.5 in total height) without changing gait speed. No evidence of imbalance.    Gait with Narrow Base of Support  Ambulates 4-7 steps.    Gait with Eyes Closed  Walks 20 ft, no assistive devices, good speed, no evidence of imbalance, normal gait pattern, deviates no more than 6 in outside 12 in walkway width. Ambulates 20 ft in less than 7 sec.    Ambulating Backwards  Walks 20 ft, uses assistive device, slower speed, mild gait deviations, deviates 6-10 in outside 12 in walkway width.    Steps  Alternating feet, must use rail.    Total Score  22           Objective measurements completed on examination: See above findings.        PT Education - 09/21/17 1149    Education provided  Yes    Education Details  exam findings, POC, HEP    Person(s) Educated  Patient    Methods  Explanation;Handout    Comprehension  Verbalized understanding       PT Short Term Goals - 09/21/17 1154      PT SHORT TERM GOAL #1   Title  Pt will score 25/30 or > on the FGA to demo improved dynamic balance and decrease her risk for falls.    Time  2    Period  Weeks    Status  New    Target Date  10/05/17      PT SHORT TERM GOAL #2   Title  Pt will be able to perform 14 STS during 30sec chair rise test in order to demo improved hip extension strength and overall functional strength.    Time  2    Period  Weeks    Status  New        PT Long Term Goals - 09/21/17 1155      PT LONG TERM GOAL #1   Title  Pt's bil hip ext MMT will improve to 4/5 or better in order to maximize balance.    Time  4    Period  Weeks    Status  New    Target Date  10/19/17      PT LONG TERM GOAL  #2   Title  Pt will be able to perform bil SLS for 20 sec or > in order to demo improved static balance and to maximize her gait on uneven ground and stair ambulation.    Time  4    Period  Weeks    Status  New             Plan - 09/21/17 1150    Clinical Impression Statement  Pt is pleasant 63PJ F who presents to OPPT for Parkinson's Disease. Pt was discharged from this clinic in February 2019 for the same referral after having made such good progress. She currently has minimal deficits in her MMT, it is mainly her hip ext which  has decreased to 3+/5 (was 4-/5 at discharge) and her balance has slightly declined since her discharge. She performed SLS for 12sec on the R and 10 sec on the L, whereas she was 18 on the R and 21 on the L in 6MA. Her dynamic balance has slightly declined as she scored 22/30 on the FGA, putting her at moderate fall risk, and she scored 26/30 on FGA at discharge 6MA. Pt needs brief stent of skilled PT intervention to address her minor impairments in static and dynamic balance in order to decrease her risk for falls. Pt will have her HEP updated regularly since she will be 1x/week for 4 weeks and this POC will focus mainly on balance as she is still performing her strengthening and PWR! HEP since her last discharge.    History and Personal Factors relevant to plan of care:  L THA 06/2016, chronic back pain, pacemaker, OA, hypothyroidism, hyperlipidemia    Clinical Presentation  Stable    Clinical Presentation due to:  hip ext MMT, SLS, FGA, 30sec chair rise test, functional strength, gait, dynamic and static balance    Clinical Decision Making  Low    Rehab Potential  Good    PT Frequency  1x / week    PT Duration  4 weeks    PT Treatment/Interventions  ADLs/Self Care Home Management;Aquatic Therapy;Cryotherapy;Moist Heat;Ultrasound;Gait training;Stair training;Functional mobility training;Therapeutic activities;Therapeutic exercise;Balance training;Neuromuscular  re-education;Patient/family education;Manual techniques;Passive range of motion;Dry needling;Taping    PT Next Visit Plan  review goals and HEP; mainly focus on static and dynamic balance activities, updating HEP regularly for at home [place]  01/26/17: bridging, STS; 1/16: HS stretch in supine, PWR! Sitting; 1/28: seated adductor stretching; 2/13: standing and seated PWR! moves;   8/29 eval: continue previous HEP, SLS    Consulted and Agree with Plan of Care  Patient       Patient will benefit from skilled therapeutic intervention in order to improve the following deficits and impairments:  Decreased balance, Decreased activity tolerance, Decreased strength, Hypomobility, Postural dysfunction, Improper body mechanics, Pain  Visit Diagnosis: Unsteadiness on feet - Plan: PT plan of care cert/re-cert  Other abnormalities of gait and mobility - Plan: PT plan of care cert/re-cert     Problem List Patient Active Problem List   Diagnosis Date Noted  . RBD (REM behavioral disorder) 01/10/2017  . Neurogenic orthostatic hypotension ([place]) 01/10/2017  . Tachy-brady syndrome ([place]) 06/23/2016  . Closed subcapital fracture of left femur ([place]) 06/23/2016  . Orthostatic hypotension due to Parkinson's disease ([place]) 06/23/2016  . PD (Parkinson's disease) ([place]) 04/21/2016  . Hypokalemia 04/21/2016  . Normal coronary arteries 2001 05/08/2014  . Pacemaker 08/10/2012  . Aortic insufficiency 08/10/2012  . Constipation 12/27/2011  . Family hx of colon cancer 12/27/2011  . HTN (hypertension) 07/06/2011  . Hypothyroidism 07/05/2011  . Paroxysmal atrial fibrillation ([place]) 07/05/2011  . Sinus pause, 7 seconds post conversion, S/P MDT pacemaker 07/05/11 07/05/2011  . Dyslipidemia 07/05/2011  . Angina at rest, with rapid AF 07/05/2011        Geraldine Solar PT, DPT   [facility] [address] [place], [place], [address] Phone:  [phone]   Fax:  [phone]  Name: ASHAYA RAFTERY MRN: [id number] Date of Birth: Oct 09, 1931

## 2017-09-27 ENCOUNTER — Telehealth (HOSPITAL_COMMUNITY): Payer: Self-pay

## 2017-09-27 ENCOUNTER — Ambulatory Visit (HOSPITAL_COMMUNITY): Payer: Medicare Other

## 2017-09-27 NOTE — Telephone Encounter (Signed)
Patient call to cancel her appt do to being SOB

## 2017-09-28 ENCOUNTER — Ambulatory Visit (HOSPITAL_COMMUNITY): Payer: Medicare Other | Admitting: Speech Pathology

## 2017-09-29 ENCOUNTER — Inpatient Hospital Stay (HOSPITAL_COMMUNITY)
Admission: EM | Admit: 2017-09-29 | Discharge: 2017-10-04 | DRG: 310 | Disposition: A | Discharge status: Home / Self Care | Payer: Medicare Other | Attending: Family Medicine | Admitting: Family Medicine

## 2017-09-29 ENCOUNTER — Other Ambulatory Visit: Payer: Self-pay

## 2017-09-29 ENCOUNTER — Encounter (HOSPITAL_COMMUNITY): Payer: Self-pay | Admitting: Emergency Medicine

## 2017-09-29 ENCOUNTER — Emergency Department (HOSPITAL_COMMUNITY): Payer: Medicare Other

## 2017-09-29 DIAGNOSIS — Z8249 Family history of ischemic heart disease and other diseases of the circulatory system: Secondary | ICD-10-CM

## 2017-09-29 DIAGNOSIS — K5900 Constipation, unspecified: Secondary | ICD-10-CM | POA: Diagnosis present

## 2017-09-29 DIAGNOSIS — Z79890 Hormone replacement therapy (postmenopausal): Secondary | ICD-10-CM | POA: Diagnosis not present

## 2017-09-29 DIAGNOSIS — G20A1 Parkinson's disease without dyskinesia, without mention of fluctuations: Secondary | ICD-10-CM | POA: Diagnosis present

## 2017-09-29 DIAGNOSIS — Z800 Family history of malignant neoplasm of digestive organs: Secondary | ICD-10-CM | POA: Diagnosis not present

## 2017-09-29 DIAGNOSIS — I4510 Unspecified right bundle-branch block: Secondary | ICD-10-CM | POA: Diagnosis present

## 2017-09-29 DIAGNOSIS — E785 Hyperlipidemia, unspecified: Secondary | ICD-10-CM | POA: Diagnosis present

## 2017-09-29 DIAGNOSIS — I10 Essential (primary) hypertension: Secondary | ICD-10-CM | POA: Diagnosis present

## 2017-09-29 DIAGNOSIS — I495 Sick sinus syndrome: Secondary | ICD-10-CM | POA: Diagnosis present

## 2017-09-29 DIAGNOSIS — G903 Multi-system degeneration of the autonomic nervous system: Secondary | ICD-10-CM | POA: Diagnosis present

## 2017-09-29 DIAGNOSIS — R079 Chest pain, unspecified: Secondary | ICD-10-CM

## 2017-09-29 DIAGNOSIS — Z79899 Other long term (current) drug therapy: Secondary | ICD-10-CM | POA: Diagnosis not present

## 2017-09-29 DIAGNOSIS — E039 Hypothyroidism, unspecified: Secondary | ICD-10-CM | POA: Diagnosis present

## 2017-09-29 DIAGNOSIS — Z96642 Presence of left artificial hip joint: Secondary | ICD-10-CM | POA: Diagnosis present

## 2017-09-29 DIAGNOSIS — Z7901 Long term (current) use of anticoagulants: Secondary | ICD-10-CM

## 2017-09-29 DIAGNOSIS — G20 Parkinson's disease: Secondary | ICD-10-CM | POA: Diagnosis present

## 2017-09-29 DIAGNOSIS — R7989 Other specified abnormal findings of blood chemistry: Secondary | ICD-10-CM

## 2017-09-29 DIAGNOSIS — I480 Paroxysmal atrial fibrillation: Secondary | ICD-10-CM | POA: Diagnosis not present

## 2017-09-29 DIAGNOSIS — Z950 Presence of cardiac pacemaker: Secondary | ICD-10-CM

## 2017-09-29 DIAGNOSIS — I351 Nonrheumatic aortic (valve) insufficiency: Secondary | ICD-10-CM | POA: Diagnosis not present

## 2017-09-29 DIAGNOSIS — I952 Hypotension due to drugs: Secondary | ICD-10-CM | POA: Diagnosis not present

## 2017-09-29 DIAGNOSIS — I080 Rheumatic disorders of both mitral and aortic valves: Secondary | ICD-10-CM | POA: Diagnosis present

## 2017-09-29 DIAGNOSIS — T447X5A Adverse effect of beta-adrenoreceptor antagonists, initial encounter: Secondary | ICD-10-CM | POA: Diagnosis not present

## 2017-09-29 DIAGNOSIS — I361 Nonrheumatic tricuspid (valve) insufficiency: Secondary | ICD-10-CM | POA: Diagnosis not present

## 2017-09-29 DIAGNOSIS — R072 Precordial pain: Secondary | ICD-10-CM | POA: Diagnosis not present

## 2017-09-29 DIAGNOSIS — R778 Other specified abnormalities of plasma proteins: Secondary | ICD-10-CM

## 2017-09-29 DIAGNOSIS — I4891 Unspecified atrial fibrillation: Secondary | ICD-10-CM | POA: Diagnosis present

## 2017-09-29 DIAGNOSIS — I455 Other specified heart block: Secondary | ICD-10-CM | POA: Diagnosis present

## 2017-09-29 DIAGNOSIS — Z90710 Acquired absence of both cervix and uterus: Secondary | ICD-10-CM | POA: Diagnosis not present

## 2017-09-29 DIAGNOSIS — R002 Palpitations: Secondary | ICD-10-CM | POA: Diagnosis not present

## 2017-09-29 DIAGNOSIS — Y92230 Patient room in hospital as the place of occurrence of the external cause: Secondary | ICD-10-CM | POA: Diagnosis not present

## 2017-09-29 LAB — TROPONIN I
Troponin I: 0.06 ng/mL (ref <0.03)
Troponin I: 0.07 ng/mL (ref <0.03)

## 2017-09-29 LAB — BASIC METABOLIC PANEL
Anion gap: 7 (ref 5–15)
BUN: 17 mg/dL (ref 8–23)
CO2: 27 mmol/L (ref 22–32)
Calcium: 9 mg/dL (ref 8.9–10.3)
Chloride: 109 mmol/L (ref 98–111)
Creatinine, Ser: 0.84 mg/dL (ref 0.44–1.00)
GFR calc Af Amer: >60 mL/min (ref >60)
GFR calc non Af Amer: >60 mL/min (ref >60)
GLUCOSE: 98 mg/dL (ref 70–99)
POTASSIUM: 3.9 mmol/L (ref 3.5–5.1)
Sodium: 143 mmol/L (ref 135–145)

## 2017-09-29 LAB — TSH: TSH: 0.799 u[IU]/mL (ref 0.350–4.500)

## 2017-09-29 LAB — CBC
HEMATOCRIT: 44.6 % (ref 36.0–46.0)
HEMOGLOBIN: 14.2 g/dL (ref 12.0–15.0)
MCH: 28.4 pg (ref 26.0–34.0)
MCHC: 31.8 g/dL (ref 30.0–36.0)
MCV: 89.2 fL (ref 78.0–100.0)
Platelets: 208 10*3/uL (ref 150–400)
RBC: 5 MIL/uL (ref 3.87–5.11)
RDW: 13.9 % (ref 11.5–15.5)
WBC: 9.6 10*3/uL (ref 4.0–10.5)

## 2017-09-29 LAB — BRAIN NATRIURETIC PEPTIDE: B Natriuretic Peptide: 692 pg/mL — ABNORMAL HIGH (ref 0.0–100.0)

## 2017-09-29 MED ORDER — METOPROLOL TARTRATE 25 MG PO TABS
25.0000 mg | ORAL_TABLET | Freq: Two times a day (BID) | ORAL | Status: DC
  Administered 2017-09-29 – 2017-10-01 (×4): 25 mg via ORAL
  Filled 2017-09-29 (×4): qty 1

## 2017-09-29 MED ORDER — SENNOSIDES-DOCUSATE SODIUM 8.6-50 MG PO TABS: 1.0000 | ORAL_TABLET | Freq: Every evening | ORAL | Status: DC | PRN

## 2017-09-29 MED ORDER — ONDANSETRON HCL 4 MG PO TABS: 4.0000 mg | ORAL_TABLET | Freq: Four times a day (QID) | ORAL | Status: DC | PRN

## 2017-09-29 MED ORDER — CARBIDOPA-LEVODOPA 25-100 MG PO TABS
1.0000 | ORAL_TABLET | Freq: Three times a day (TID) | ORAL | Status: DC
  Administered 2017-09-29 – 2017-10-04 (×15): 1 via ORAL
  Filled 2017-09-29 (×17): qty 1

## 2017-09-29 MED ORDER — SODIUM CHLORIDE 0.9% FLUSH: 3.0000 mL | INTRAVENOUS | Status: DC | PRN

## 2017-09-29 MED ORDER — ACETAMINOPHEN 650 MG RE SUPP: 650.0000 mg | Freq: Four times a day (QID) | RECTAL | Status: DC | PRN

## 2017-09-29 MED ORDER — SODIUM CHLORIDE 0.9 % IV SOLN: 250.0000 mL | INTRAVENOUS | Status: DC | PRN

## 2017-09-29 MED ORDER — APIXABAN 2.5 MG PO TABS
2.5000 mg | ORAL_TABLET | Freq: Once | ORAL | Status: AC
  Administered 2017-09-29: 2.5 mg via ORAL
  Filled 2017-09-29 (×2): qty 1

## 2017-09-29 MED ORDER — ROSUVASTATIN CALCIUM 10 MG PO TABS
10.0000 mg | ORAL_TABLET | Freq: Every day | ORAL | Status: DC
  Administered 2017-09-29 – 2017-10-04 (×5): 10 mg via ORAL
  Filled 2017-09-29 (×5): qty 1

## 2017-09-29 MED ORDER — LEVOTHYROXINE SODIUM 75 MCG PO TABS
75.0000 ug | ORAL_TABLET | Freq: Every day | ORAL | Status: DC
  Administered 2017-09-30 – 2017-10-04 (×5): 75 ug via ORAL
  Filled 2017-09-29 (×5): qty 1

## 2017-09-29 MED ORDER — INFLUENZA VAC SPLIT HIGH-DOSE 0.5 ML IM SUSY: 0.5000 mL | PREFILLED_SYRINGE | INTRAMUSCULAR | Status: DC

## 2017-09-29 MED ORDER — FLUDROCORTISONE ACETATE 0.1 MG PO TABS
100.0000 ug | ORAL_TABLET | Freq: Every day | ORAL | Status: DC
  Administered 2017-09-29 – 2017-10-04 (×6): 100 ug via ORAL
  Filled 2017-09-29 (×9): qty 1

## 2017-09-29 MED ORDER — SODIUM CHLORIDE 0.9% FLUSH
3.0000 mL | Freq: Two times a day (BID) | INTRAVENOUS | Status: DC
  Administered 2017-09-29 – 2017-10-04 (×8): 3 mL via INTRAVENOUS

## 2017-09-29 MED ORDER — ONDANSETRON HCL 4 MG/2ML IJ SOLN: 4.0000 mg | Freq: Four times a day (QID) | INTRAMUSCULAR | Status: DC | PRN

## 2017-09-29 MED ORDER — METOPROLOL TARTRATE 5 MG/5ML IV SOLN
5.0000 mg | Freq: Once | INTRAVENOUS | Status: AC
  Administered 2017-09-29: 5 mg via INTRAVENOUS
  Filled 2017-09-29: qty 5

## 2017-09-29 MED ORDER — ACETAMINOPHEN 325 MG PO TABS: 650.0000 mg | ORAL_TABLET | Freq: Four times a day (QID) | ORAL | Status: DC | PRN

## 2017-09-29 MED ORDER — APIXABAN 2.5 MG PO TABS
2.5000 mg | ORAL_TABLET | Freq: Two times a day (BID) | ORAL | Status: DC
  Administered 2017-09-29 – 2017-10-04 (×10): 2.5 mg via ORAL
  Filled 2017-09-29 (×10): qty 1

## 2017-09-29 MED ORDER — CARBIDOPA-LEVODOPA ER 50-200 MG PO TBCR
1.0000 | EXTENDED_RELEASE_TABLET | Freq: Every day | ORAL | Status: DC
  Administered 2017-09-29 – 2017-10-04 (×5): 1 via ORAL
  Filled 2017-09-29 (×7): qty 1

## 2017-09-29 NOTE — H&P (Signed)
History and Physical    Abigail Taylor:[id number] DOB: 04-01-1931 DOA: 09/29/2017  Referring MD/NP/PA: Francine Graven, EDP PCP: [name], MD  Patient coming from: Home  Chief Complaint: Palpitations  HPI: Abigail Taylor is a 84 y.o. female with history of atrial fibrillation chronically anticoagulated on Eliquis but not on rate controlling medications (unclear as of reasoning but she does have a history of orthostatic hypotension which I suspect may be why), tachybradycardia syndrome status post pacemaker placement, hyperlipidemia, hypothyroidism who comes to the hospital today with complaint of palpitations.  She states she has been feeling really unwell for about 8 weeks, today she felt her chest beating really fast and thought she may pass out which she never did.  She came to the hospital for evaluation.  EKG on admission showed A. fib with a heart rate of around 1 40-1 50.  Request was made for admission.  Vital signs are otherwise stable, labs are unremarkable.  Past Medical/Surgical History: Past Medical History:  Diagnosis Date  . Angina at rest, with the tachycardia 07/05/2011  . Aortic insufficiency 07/05/2011  . Atrial fibrillation ([place])   . Brady-tachy syndrome ([place])    Dual chamber Medtronic pacemaker Adapta  . Chronic back pain   . Constipation 12/27/2011  . Gait instability   . Hyperlipidemia   . Hypothyroidism   . Normal cardiac stress test 2013  . OA (osteoarthritis)   . Pacemaker   . Parkinson's disease ([place])   . Tremor     Past Surgical History:  Procedure Laterality Date  . ABDOMINAL HYSTERECTOMY    . CARDIAC CATHETERIZATION  05/26/1999   normal  . CARDIAC CATHETERIZATION  2001   normal coronary arteries  . cataract    . COLONOSCOPY N/A 11/15/2012   Procedure: COLONOSCOPY;  Surgeon: [name], MD;  [facility];  Service: Endoscopy;  Laterality: N/A;  1030  . HIP ARTHROPLASTY Left 06/24/2016   Procedure: ARTHROPLASTY BIPOLAR HIP  (HEMIARTHROPLASTY);  Surgeon: Altamese River Road, MD;  Location: [facility];  Service: Orthopedics;  Laterality: Left;  . INSERT / REPLACE / REMOVE PACEMAKER    . NM MYOVIEW LTD  07/19/2011   normal  . PERMANENT PACEMAKER INSERTION  07/05/2011   Medtronic Adapta dual chamber  . PERMANENT PACEMAKER INSERTION N/A 07/05/2011   Procedure: PERMANENT PACEMAKER INSERTION;  Surgeon: [name], MD;  Location: [facility];  Service: Cardiovascular;  Laterality: N/A;    Social History:  reports that she has never smoked. She has never used smokeless tobacco. She reports that she does not drink alcohol or use drugs.  Allergies: No Known Allergies  Family History:  Family History  Problem Relation Age of Onset  . Colon cancer Mother 61  . Heart attack Mother   . Cancer Father   . Suicidality Son     Prior to Admission medications   Medication Sig Start Date End Date Taking? Authorizing Provider  carbidopa-levodopa (SINEMET CR) 50-200 MG tablet TAKE 1 TABLET BY MOUTH AT BEDTIME 07/18/17  Yes Tat, Rebecca S, DO  carbidopa-levodopa (SINEMET IR) 25-100 MG per tablet Take 1 tablet by mouth 3 (three) times daily.    Yes [provider]  ELIQUIS 2.5 MG TABS tablet TAKE 1 TABLET BY MOUTH TWICE A DAY 07/24/17  Yes Croitoru, Mihai, MD  fludrocortisone (FLORINEF) 0.1 MG tablet Take 1 tablet (100 mcg total) by mouth daily. 04/13/17  Yes Croitoru, Mihai, MD  levothyroxine (SYNTHROID, LEVOTHROID) 75 MCG tablet Take 75 mcg by  mouth daily before breakfast.   Yes [provider]  rosuvastatin (CRESTOR) 10 MG tablet Take 10 mg by mouth at bedtime. Reported on 07/09/2015   Yes [provider]  XIIDRA 5 % SOLN  10/04/16  Yes [provider]  acetaminophen (TYLENOL) 325 MG tablet Take 2 tablets (650 mg total) by mouth every 6 (six) hours as needed for mild pain (or Fever >/= 101). 06/27/16   Alphonzo Grieve, MD  senna-docusate (SENOKOT-S) 8.6-50 MG tablet Take 1 tablet by mouth at bedtime as  needed for mild constipation. 06/27/16   Alphonzo Grieve, MD    Review of Systems:  Constitutional: Denies fever, chills, diaphoresis, appetite change and fatigue.  HEENT: Denies photophobia, eye pain, redness, hearing loss, ear pain, congestion, sore throat, rhinorrhea, sneezing, mouth sores, trouble swallowing, neck pain, neck stiffness and tinnitus.   Respiratory: Denies SOB, DOE, cough, chest tightness,  and wheezing.   Cardiovascular: Denies chest pain and leg swelling.  Gastrointestinal: Denies nausea, vomiting, abdominal pain, diarrhea, constipation, blood in stool and abdominal distention.  Genitourinary: Denies dysuria, urgency, frequency, hematuria, flank pain and difficulty urinating.  Endocrine: Denies: hot or cold intolerance, sweats, changes in hair or nails, polyuria, polydipsia. Musculoskeletal: Denies myalgias, back pain, joint swelling, arthralgias and gait problem.  Skin: Denies pallor, rash and wound.  Neurological: Denies dizziness, seizures, syncope, weakness, light-headedness, numbness and headaches.  Hematological: Denies adenopathy. Easy bruising, personal or family bleeding history  Psychiatric/Behavioral: Denies suicidal ideation, mood changes, confusion, nervousness, sleep disturbance and agitation    Physical Exam: Vitals:   09/29/17 1230 09/29/17 1300 09/29/17 1330 09/29/17 1419  BP: 105/76 (!) 110/58 108/68 120/86  Pulse: 65 (!) 35 (!) 53 86  Resp: 15 16 17 18   Temp:    98.7 F (37.1 C)  TempSrc:      SpO2: 100% 99% 100% 100%  Weight:      Height:         Constitutional: NAD, calm, comfortable, pleasant Eyes: PERRL, lids and conjunctivae normal ENMT: Mucous membranes are moist. Posterior pharynx clear of any exudate or lesions.Normal dentition.  Neck: normal, supple, no masses, no thyromegaly Respiratory: clear to auscultation bilaterally, no wheezing, no crackles. Normal respiratory effort. No accessory muscle use.  Cardiovascular: Tachycardic and  irregular, no murmurs / rubs / gallops. No extremity edema. 2+ pedal pulses. No carotid bruits.  Abdomen: no tenderness, no masses palpated. No hepatosplenomegaly. Bowel sounds positive.  Musculoskeletal: no clubbing / cyanosis. No joint deformity upper and lower extremities. Good ROM, no contractures. Normal muscle tone.  Skin: no rashes, lesions, ulcers. No induration Neurologic: CN 2-12 grossly intact. Sensation intact, DTR normal. Strength 5/5 in all 4.  Psychiatric: Normal judgment and insight. Alert and oriented x 3. Normal mood.    Labs on Admission: I have personally reviewed the following labs and imaging studies  CBC: Recent Labs  Lab 09/29/17 1115  WBC 9.6  HGB 14.2  HCT 44.6  MCV 89.2  PLT 748   Basic Metabolic Panel: Recent Labs  Lab 09/29/17 1115  NA 143  K 3.9  CL 109  CO2 27  GLUCOSE 98  BUN 17  CREATININE 0.84  CALCIUM 9.0   GFR: Estimated Creatinine Clearance: 43.5 mL/min (by C-G formula based on SCr of 0.84 mg/dL). Liver Function Tests: No results for input(s): AST, ALT, ALKPHOS, BILITOT, PROT, ALBUMIN in the last 168 hours. No results for input(s): LIPASE, AMYLASE in the last 168 hours. No results for input(s): AMMONIA in the last 168  hours. Coagulation Profile: No results for input(s): INR, PROTIME in the last 168 hours. Cardiac Enzymes: Recent Labs  Lab 09/29/17 1115  TROPONINI 0.06*   BNP (last 3 results) No results for input(s): PROBNP in the last 8760 hours. HbA1C: No results for input(s): HGBA1C in the last 72 hours. CBG: No results for input(s): GLUCAP in the last 168 hours. Lipid Profile: No results for input(s): CHOL, HDL, LDLCALC, TRIG, CHOLHDL, LDLDIRECT in the last 72 hours. Thyroid Function Tests: No results for input(s): TSH, T4TOTAL, FREET4, T3FREE, THYROIDAB in the last 72 hours. Anemia Panel: No results for input(s): VITAMINB12, FOLATE, FERRITIN, TIBC, IRON, RETICCTPCT in the last 72 hours. Urine analysis:    Component  Value Date/Time   COLORURINE YELLOW 06/24/2016 [place] 06/24/2016 0947   LABSPEC 1.020 06/24/2016 0947   PHURINE 5.0 06/24/2016 0947   GLUCOSEU NEGATIVE 06/24/2016 0947   HGBUR SMALL (A) 06/24/2016 0947   BILIRUBINUR NEGATIVE 06/24/2016 0947   KETONESUR 20 (A) 06/24/2016 0947   PROTEINUR NEGATIVE 06/24/2016 0947   UROBILINOGEN 0.2 05/07/2014 2101   NITRITE NEGATIVE 06/24/2016 0947   LEUKOCYTESUR NEGATIVE 06/24/2016 0947   Sepsis Labs: @LABRCNTIP (procalcitonin:4,lacticidven:4) )No results found for this or any previous visit (from the past 240 hour(s)).   Radiological Exams on Admission: Dg Chest Port 1 View  Result Date: 09/29/2017 CLINICAL DATA:  Hervey Ard mid chest pain that shoots into back/a fib/hx heart cath/pacemaker insertion EXAM: PORTABLE CHEST 1 VIEW COMPARISON:  04/18/2016 FINDINGS: Cardiac silhouette is normal in size. No mediastinal or hilar masses. No evidence of adenopathy. Lungs are hyperexpanded, but clear. No convincing pleural effusion. No pneumothorax. Left anterior chest wall sequential pacemaker is stable. Skeletal structures are grossly intact. IMPRESSION: No active disease. Electronically Signed   By: Lajean Manes M.D.   On: 09/29/2017 12:23    EKG: Independently reviewed.  Atrial fibrillation at a rate of 147, right bundle branch block, no acute changes  Assessment/Plan Principal Problem:   Atrial fibrillation with RVR (HCC) Active Problems:   Hypothyroidism   Sinus pause, 7 seconds post conversion, S/P MDT pacemaker 07/05/11   Dyslipidemia   HTN (hypertension)   PD (Parkinson's disease) ([place])   Tachy-brady syndrome (HCC)   Orthostatic hypotension due to Parkinson's disease ([place])    Atrial fibrillation with rapid ventricular response -She is on Eliquis for anticoagulation. -She has not been maintained on rate controlling medications, presumably because of a history of orthostatic hypotension. -Case was discussed with Dr. Dorris Carnes, she  has recommended initiating metoprolol 25 mg twice daily, recommends against IV Cardizem at this time. -Will monitor on telemetry.  Tachybradycardia syndrome -She is status post pacemaker.  History of orthostatic hypotension due to Parkinson's disease -Continue Florinef, blood pressure currently stable.  Hypothyroidism -Check TSH, continue Synthroid.   DVT prophylaxis: On Eliquis Code Status: Full code Family Communication: Granddaughter at bedside updated on plan of care and all questions answered Disposition Plan: Home once medically stable, anticipate 24 to 48 hours Consults called: Curbside with cardiology Admission status: Admit - It is my clinical opinion that admission to INPATIENT is reasonable and necessary because of the expectation that this patient will require hospital care that crosses at least 2 midnights to treat this condition based on the medical complexity of the problems presented.  Given the aforementioned information, the predictability of an adverse outcome is felt to be significant.      Time Spent: 85 minutes  Estela Isaac Bliss MD [facility] Pager [phone]  If  7PM-7AM, please contact night-coverage [email or web address] Password [email or web address]  09/29/2017, 5:51 PM

## 2017-09-29 NOTE — ED Notes (Signed)
CRITICAL VALUE ALERT  Critical Value:  Troponin 0.06  Date & Time Notied: 09/29/17  1144  Provider Notified: Dr Thurnell Garbe   Orders Received/Actions taken: none

## 2017-09-29 NOTE — ED Notes (Signed)
Patient ambulatory to restroom.  Patient stated she was "slightly dizzy upon standing" but was ambulatory with very little assistance.

## 2017-09-29 NOTE — ED Triage Notes (Addendum)
Pt c/o heart palpitations and weakness since Tuesday. States she is on a blood thinner. States she has intermittent stabbing central CP into her back. Has a pacemaker and is followed by Croitoru for cardiology

## 2017-09-29 NOTE — ED Provider Notes (Signed)
[facility] EMERGENCY DEPARTMENT Provider Note   CSN: [id number] Arrival date & time: 09/29/17  1053     History   Chief Complaint Chief Complaint  Patient presents with  . Atrial Fibrillation    HPI Abigail Taylor is a 84 y.o. female.  HPI  Pt was seen at 1145. Per pt, c/o gradual onset and persistence of multiple intermittent episodes of palpitations for the past 3 days. Pt describes the palpitations as per her usual PAF. Has been associated with near syncope, generalized weakness and SOB. Pt states she has been taking cardizem 30mg  PO daily for the past 3 days with transient improvement of her symptoms. Pt states she also has been having intermittent mid-sternal chest "stabbing" discomfort which radiates into her back. Pt states this has been occurring on exertion and improving with resting. Pt describes this as different from her palpitations. Pt took a dose of cardizem this morning approximately 0930 PTA. States she "feels better." Endorses compliance with eliquis, but did not take it this morning. Denies cough, no abd pain, no N/V/D, no fevers, no rash, no syncope, no focal motor weakness.     Past Medical History:  Diagnosis Date  . Angina at rest, with the tachycardia 07/05/2011  . Aortic insufficiency 07/05/2011  . Atrial fibrillation ([place])   . Brady-tachy syndrome ([place])    Dual chamber Medtronic pacemaker Adapta  . Chronic back pain   . Constipation 12/27/2011  . Gait instability   . Hyperlipidemia   . Hypothyroidism   . Normal cardiac stress test 2013  . OA (osteoarthritis)   . Pacemaker   . Parkinson's disease ([place])   . Tremor     Patient Active Problem List   Diagnosis Date Noted  . RBD (REM behavioral disorder) 01/10/2017  . Neurogenic orthostatic hypotension ([place]) 01/10/2017  . Tachy-brady syndrome ([place]) 06/23/2016  . Closed subcapital fracture of left femur ([place]) 06/23/2016  . Orthostatic hypotension due to Parkinson's disease ([place]) 06/23/2016  . PD  (Parkinson's disease) ([place]) 04/21/2016  . Hypokalemia 04/21/2016  . Normal coronary arteries 2001 05/08/2014  . Pacemaker 08/10/2012  . Aortic insufficiency 08/10/2012  . Constipation 12/27/2011  . Family hx of colon cancer 12/27/2011  . HTN (hypertension) 07/06/2011  . Hypothyroidism 07/05/2011  . Paroxysmal atrial fibrillation ([place]) 07/05/2011  . Sinus pause, 7 seconds post conversion, S/P MDT pacemaker 07/05/11 07/05/2011  . Dyslipidemia 07/05/2011  . Angina at rest, with rapid AF 07/05/2011    Past Surgical History:  Procedure Laterality Date  . ABDOMINAL HYSTERECTOMY    . CARDIAC CATHETERIZATION  05/26/1999   normal  . CARDIAC CATHETERIZATION  2001   normal coronary arteries  . cataract    . COLONOSCOPY N/A 11/15/2012   Procedure: COLONOSCOPY;  Surgeon: [name], MD;  [facility];  Service: Endoscopy;  Laterality: N/A;  1030  . HIP ARTHROPLASTY Left 06/24/2016   Procedure: ARTHROPLASTY BIPOLAR HIP (HEMIARTHROPLASTY);  Surgeon: Altamese Momence, MD;  Location: [facility];  Service: Orthopedics;  Laterality: Left;  . INSERT / REPLACE / REMOVE PACEMAKER    . NM MYOVIEW LTD  07/19/2011   normal  . PERMANENT PACEMAKER INSERTION  07/05/2011   Medtronic Adapta dual chamber  . PERMANENT PACEMAKER INSERTION N/A 07/05/2011   Procedure: PERMANENT PACEMAKER INSERTION;  Surgeon: [name], MD;  Location: [facility] [place] [facility];  Service: Cardiovascular;  Laterality: N/A;     OB History   None      Home Medications    Prior to  Admission medications   Medication Sig Start Date End Date Taking? Authorizing Provider  acetaminophen (TYLENOL) 325 MG tablet Take 2 tablets (650 mg total) by mouth every 6 (six) hours as needed for mild pain (or Fever >/= 101). 06/27/16   Alphonzo Grieve, MD  carbidopa-levodopa (SINEMET CR) 50-200 MG tablet TAKE 1 TABLET BY MOUTH AT BEDTIME 07/18/17   Tat, Rebecca S, DO  carbidopa-levodopa (SINEMET IR) 25-100 MG per tablet Take 1 tablet by mouth 3 (three)  times daily.     [provider]  ELIQUIS 2.5 MG TABS tablet TAKE 1 TABLET BY MOUTH TWICE A DAY 07/24/17   Croitoru, Mihai, MD  fludrocortisone (FLORINEF) 0.1 MG tablet Take 1 tablet (100 mcg total) by mouth daily. 04/13/17   Croitoru, Mihai, MD  levothyroxine (SYNTHROID, LEVOTHROID) 75 MCG tablet Take 75 mcg by mouth daily before breakfast.    [provider]  rosuvastatin (CRESTOR) 10 MG tablet Take 10 mg by mouth at bedtime. Reported on 07/09/2015    [provider]  senna-docusate (SENOKOT-S) 8.6-50 MG tablet Take 1 tablet by mouth at bedtime as needed for mild constipation. 06/27/16   Alphonzo Grieve, MD  XIIDRA 5 % SOLN  10/04/16   [provider]    Family History Family History  Problem Relation Age of Onset  . Colon cancer Mother 71  . Heart attack Mother   . Cancer Father   . Suicidality Son     Social History Social History   Tobacco Use  . Smoking status: Never Smoker  . Smokeless tobacco: Never Used  Substance Use Topics  . Alcohol use: No    Alcohol/week: 0.0 standard drinks  . Drug use: No     Allergies   Patient has no known allergies.   Review of Systems Review of Systems ROS: Statement: All systems negative except as marked or noted in the HPI; Constitutional: Negative for fever and chills. +generalized weakness. ; ; Eyes: Negative for eye pain, redness and discharge. ; ; ENMT: Negative for ear pain, hoarseness, nasal congestion, sinus pressure and sore throat. ; ; Cardiovascular: +CP, SOB, palpitations. Negative for diaphoresis, and peripheral edema. ; ; Respiratory: Negative for cough, wheezing and stridor. ; ; Gastrointestinal: Negative for nausea, vomiting, diarrhea, abdominal pain, blood in stool, hematemesis, jaundice and rectal bleeding. . ; ; Genitourinary: Negative for dysuria, flank pain and hematuria. ; ; Musculoskeletal: Negative for back pain and neck pain. Negative for swelling and trauma.; ; Skin: Negative for  pruritus, rash, abrasions, blisters, bruising and skin lesion.; ; Neuro: +lightheadedness. Negative for headache and neck stiffness. Negative for altered level of consciousness, altered mental status, extremity weakness, paresthesias, involuntary movement, seizure and syncope.       Physical Exam Updated Vital Signs BP 111/74   Pulse 94   Temp 97.7 F (36.5 C) (Oral)   Resp 19   Ht 5\' 6"  (1.676 m)   Wt 55.3 kg   SpO2 99%   BMI 19.69 kg/m    Patient Vitals for the past 24 hrs:  BP Temp Temp src Pulse Resp SpO2 Height Weight  09/29/17 1200 111/74 - - 94 19 99 % - -  09/29/17 1121 104/76 97.7 F (36.5 C) Oral 91 18 100 % - -  09/29/17 1101 - - - - - - 5\' 6"  (1.676 m) 55.3 kg   12:18 Orthostatic Vital Signs VP  Orthostatic Lying   BP- Lying: 97/74   Pulse- Lying: 100       Orthostatic  Sitting  BP- Sitting: 113/65   Pulse- Sitting: 113       Orthostatic Standing at 0 minutes  BP- Standing at 0 minutes: 108/68   Pulse- Standing at 0 minutes: 118      Physical Exam 1150: Physical examination:  Nursing notes reviewed; Vital signs and O2 SAT reviewed;  Constitutional: Well developed, Well nourished, Well hydrated, In no acute distress; Head:  Normocephalic, atraumatic; Eyes: EOMI, PERRL, No scleral icterus; ENMT: Mouth and pharynx normal, Mucous membranes moist; Neck: Supple, Full range of motion, No lymphadenopathy; Cardiovascular: Irregular rate and rhythm, rate 100-120's during my exam. No gallop; Respiratory: Breath sounds clear & equal bilaterally, No wheezes.  Speaking full sentences with ease, Normal respiratory effort/excursion; Chest: Nontender, Movement normal; Abdomen: Soft, Nontender, Nondistended, Normal bowel sounds; Genitourinary: No CVA tenderness; Extremities: Peripheral pulses normal, No tenderness, No edema, No calf edema or asymmetry.; Neuro: AA&Ox3, Major CN grossly intact.  Speech clear. No gross focal motor or sensory deficits in extremities.; Skin: Color  normal, Warm, Dry.   ED Treatments / Results  Labs (all labs ordered are listed, but only abnormal results are displayed)   EKG EKG Interpretation  Date/Time:  Friday September 29 2017 11:07:02 EDT Ventricular Rate:  147 PR Interval:    QRS Duration: 121 QT Interval:  335 QTC Calculation: 524 R Axis:   42 Text Interpretation:  Atrial fibrillation with rapid ventricular response Right bundle branch block When compared with ECG of 04/18/2016 No significant change was found Confirmed by Francine Graven [phone]) on 09/29/2017 11:56:30 AM     Radiology   Procedures Procedures (including critical care time)  Medications Ordered in ED Medications - No data to display   Initial Impression / Assessment and Plan / ED Course  I have reviewed the triage vital signs and the nursing notes.  Pertinent labs & imaging results that were available during my care of the patient were reviewed by me and considered in my medical decision making (see chart for details).  MDM Reviewed: previous chart, nursing note and vitals Reviewed previous: labs and ECG Interpretation: labs, ECG and x-ray Total time providing critical care: 30-74 minutes. This excludes time spent performing separately reportable procedures and services. Consults: cardiology and admitting MD   CRITICAL CARE Performed by: Francine Graven Total critical care time: 35 minutes Critical care time was exclusive of separately billable procedures and treating other patients. Critical care was necessary to treat or prevent imminent or life-threatening deterioration. Critical care was time spent personally by me on the following activities: development of treatment plan with patient and/or surrogate as well as nursing, discussions with consultants, evaluation of patient's response to treatment, examination of patient, obtaining history from patient or surrogate, ordering and performing treatments and interventions, ordering and  review of laboratory studies, ordering and review of radiographic studies, pulse oximetry and re-evaluation of patient's condition.   Results for orders placed or performed during the hospital encounter of 92/42/68  Basic metabolic panel  Result Value Ref Range   Sodium 143 135 - 145 mmol/L   Potassium 3.9 3.5 - 5.1 mmol/L   Chloride 109 98 - 111 mmol/L   CO2 27 22 - 32 mmol/L   Glucose, Bld 98 70 - 99 mg/dL   BUN 17 8 - 23 mg/dL   Creatinine, Ser 0.84 0.44 - 1.00 mg/dL   Calcium 9.0 8.9 - 10.3 mg/dL   GFR calc non Af Amer >60 >60 mL/min   GFR calc Af Amer >60 >60 mL/min  Anion gap 7 5 - 15  CBC  Result Value Ref Range   WBC 9.6 4.0 - 10.5 K/uL   RBC 5.00 3.87 - 5.11 MIL/uL   Hemoglobin 14.2 12.0 - 15.0 g/dL   HCT 44.6 36.0 - 46.0 %   MCV 89.2 78.0 - 100.0 fL   MCH 28.4 26.0 - 34.0 pg   MCHC 31.8 30.0 - 36.0 g/dL   RDW 13.9 11.5 - 15.5 %   Platelets 208 150 - 400 K/uL  Troponin I  Result Value Ref Range   Troponin I 0.06 (HH) <0.03 ng/mL  Brain natriuretic peptide  Result Value Ref Range   B Natriuretic Peptide 692.0 (H) 0.0 - 100.0 pg/mL   Dg Chest Port 1 View  Result Date: 09/29/2017 CLINICAL DATA:  Sharp mid chest pain that shoots into back/a fib/hx heart cath/pacemaker insertion EXAM: PORTABLE CHEST 1 VIEW COMPARISON:  04/18/2016 FINDINGS: Cardiac silhouette is normal in size. No mediastinal or hilar masses. No evidence of adenopathy. Lungs are hyperexpanded, but clear. No convincing pleural effusion. No pneumothorax. Left anterior chest wall sequential pacemaker is stable. Skeletal structures are grossly intact. IMPRESSION: No active disease. Electronically Signed   By: Lajean Manes M.D.   On: 09/29/2017 12:23    1230:  HR ranging from 100-120's during my exam. BP soft but stable. Troponin and BNP mildly elevated but no overt CHF on CXR. Pt currently denies CP. Will dose eliquis now. Pacemaker interrogation pending. T/C returned from [facility] Cards Dr. Harrington Challenger, case discussed,  including:  HPI, pertinent PM/SHx, VS/PE, dx testing, ED course and treatment:  OK for pt to stay at [facility] (vs transfer to [facility]) as there is no acute intervention needed at this time other than rate control, recommends IV metoprolol 5mg  now then can transition to PO 25mg  to rate control (likely will have less BP drop with b-blocker vs further doses of cardizem).  Dx and testing, as well as d/w Cards MD, d/w pt and family.  Questions answered.  Verb understanding, agreeable to admit.   1250:  T/C returned from [facility] Dr. Jerilee Hoh, case discussed, including:  HPI, pertinent PM/SHx, VS/PE, dx testing, ED course and treatment:  Agreeable to admit.     Final Clinical Impressions(s) / ED Diagnoses   Final diagnoses:  None    ED Discharge Orders    None       Francine Graven, DO 10/03/17 0383

## 2017-09-29 NOTE — ED Notes (Signed)
ED Provider at bedside. 

## 2017-09-30 ENCOUNTER — Inpatient Hospital Stay (HOSPITAL_COMMUNITY): Payer: Medicare Other

## 2017-09-30 DIAGNOSIS — I361 Nonrheumatic tricuspid (valve) insufficiency: Secondary | ICD-10-CM

## 2017-09-30 DIAGNOSIS — I351 Nonrheumatic aortic (valve) insufficiency: Secondary | ICD-10-CM

## 2017-09-30 LAB — TROPONIN I
Troponin I: 0.06 ng/mL (ref <0.03)
Troponin I: 0.06 ng/mL (ref <0.03)

## 2017-09-30 LAB — CBC
HCT: 43.8 % (ref 36.0–46.0)
HEMOGLOBIN: 13.9 g/dL (ref 12.0–15.0)
MCH: 28.4 pg (ref 26.0–34.0)
MCHC: 31.7 g/dL (ref 30.0–36.0)
MCV: 89.4 fL (ref 78.0–100.0)
Platelets: 207 10*3/uL (ref 150–400)
RBC: 4.9 MIL/uL (ref 3.87–5.11)
RDW: 14.1 % (ref 11.5–15.5)
WBC: 8.2 10*3/uL (ref 4.0–10.5)

## 2017-09-30 LAB — BASIC METABOLIC PANEL
Anion gap: 11 (ref 5–15)
BUN: 17 mg/dL (ref 8–23)
CHLORIDE: 110 mmol/L (ref 98–111)
CO2: 23 mmol/L (ref 22–32)
Calcium: 8.9 mg/dL (ref 8.9–10.3)
Creatinine, Ser: 0.84 mg/dL (ref 0.44–1.00)
GFR calc Af Amer: >60 mL/min (ref >60)
GFR calc non Af Amer: >60 mL/min (ref >60)
GLUCOSE: 98 mg/dL (ref 70–99)
Potassium: 4.2 mmol/L (ref 3.5–5.1)
SODIUM: 144 mmol/L (ref 135–145)

## 2017-09-30 LAB — ECHOCARDIOGRAM COMPLETE
HEIGHTINCHES: 66 in
Weight: 1952 oz

## 2017-09-30 NOTE — Progress Notes (Signed)
  Echocardiogram 2D Echocardiogram has been performed.  Abigail Taylor L Androw 09/30/2017, 3:33 PM

## 2017-09-30 NOTE — Progress Notes (Signed)
PROGRESS NOTE    Abigail Taylor  HER:[id number] DOB: 1931-09-13 DOA: 09/29/2017 PCP: [name], MD     Brief Narrative:  84 year old woman admitted from home on 9/6 due to palpitations.  She has a history of atrial fibrillation chronically anticoagulated on Eliquis but not on rate controlling medications, presumably due to a history of orthostatic hypotension related to her Parkinson's disease.  She states she has been feeling unwell for about 8 weeks, on day of admission felt her chest beating really fast and thought that she might pass out which she never did, because of that she came to the hospital for evaluation.  Initial heart rates were in the 1 40-1 50 range.  After discussion on the telephone with Dr. Dorris Carnes admission was recommended, she recommended against initiation of IV Cardizem given her history of orthostatic hypotension and instead recommended metoprolol 25 mg twice daily.   Assessment & Plan:   Principal Problem:   Atrial fibrillation with RVR (HCC) Active Problems:   Hypothyroidism   Sinus pause, 7 seconds post conversion, S/P MDT pacemaker 07/05/11   Dyslipidemia   HTN (hypertension)   PD (Parkinson's disease) ([place])   Tachy-brady syndrome (HCC)   Orthostatic hypotension due to Parkinson's disease ([place])   Atrial fibrillation with rapid ventricular response -Initial heart rates in the 1 40-1 50 range. -She was not on any rate controlling medications at home, presumably due to history of orthostatic hypotension related to Parkinson's disease. -After discussion with Dr. Harrington Challenger, we have initiated metoprolol 25 mg p.o. twice daily. -Her heart rate has improved today to the 110 to 120 range, patient feels much improved and does not have a physical sensation of palpitations. -We will keep patient in the hospital another 24 hours to continue to monitor heart rate, will not increase metoprolol any further as of yet due to her history of orthostatic hypotension.   Realistically she has only received 2 doses of metoprolol. -2D echo pending  Tachybradycardia syndrome -Status post pacemaker  History of orthostatic hypotension due to Parkinson's disease -Continue Florinef, blood pressure remains stable.  Hypothyroidism -TSH is within normal limits at 0.799, continue home dose of Synthroid.   DVT prophylaxis: Fully anticoagulated on Eliquis Code Status: Full code Family Communication: Husband and granddaughter at bedside updated on plan of care and all questions answered Disposition Plan: Likely home in 24 hours with improvement in heart rate  Consultants:   Curbside telephone consultation with cardiology, Dr. Harrington Challenger on 9/6.  Procedures:   2D echo pending  Antimicrobials:  Anti-infectives (From admission, onward)   None       Subjective: Sitting in chair at bedside currently eating lunch, states she is significantly improved, wants to go home.  Objective: Vitals:   09/29/17 1419 09/29/17 2006 09/29/17 2055 09/30/17 0651  BP: 120/86  120/74 104/76  Pulse: 86  99 73  Resp: 18  18 18   Temp: 98.7 F (37.1 C)  97.8 F (36.6 C) (!) 97.5 F (36.4 C)  TempSrc:   Oral Oral  SpO2: 100% 97% 98% 98%  Weight:      Height:       No intake or output data in the 24 hours ending 09/30/17 1219 Filed Weights   09/29/17 1101  Weight: 55.3 kg    Examination:  General exam: Alert, awake, oriented x 3 Respiratory system: Clear to auscultation. Respiratory effort normal. Cardiovascular system: Tachycardic, irregular. No murmurs, rubs, gallops. Gastrointestinal system: Abdomen is nondistended, soft and nontender. No organomegaly or  masses felt. Normal bowel sounds heard. Central nervous system: Alert and oriented. No focal neurological deficits. Extremities: No C/C/E, +pedal pulses Skin: No rashes, lesions or ulcers Psychiatry: Judgement and insight appear normal. Mood & affect appropriate.     Data Reviewed: I have personally reviewed  following labs and imaging studies  CBC: Recent Labs  Lab 09/29/17 1115 09/30/17 0610  WBC 9.6 8.2  HGB 14.2 13.9  HCT 44.6 43.8  MCV 89.2 89.4  PLT 208 427   Basic Metabolic Panel: Recent Labs  Lab 09/29/17 1115 09/30/17 0610  NA 143 144  K 3.9 4.2  CL 109 110  CO2 27 23  GLUCOSE 98 98  BUN 17 17  CREATININE 0.84 0.84  CALCIUM 9.0 8.9   GFR: Estimated Creatinine Clearance: 43.5 mL/min (by C-G formula based on SCr of 0.84 mg/dL). Liver Function Tests: No results for input(s): AST, ALT, ALKPHOS, BILITOT, PROT, ALBUMIN in the last 168 hours. No results for input(s): LIPASE, AMYLASE in the last 168 hours. No results for input(s): AMMONIA in the last 168 hours. Coagulation Profile: No results for input(s): INR, PROTIME in the last 168 hours. Cardiac Enzymes: Recent Labs  Lab 09/29/17 1115 09/29/17 1826 09/30/17 0006 09/30/17 0610  TROPONINI 0.06* 0.07* 0.06* 0.06*   BNP (last 3 results) No results for input(s): PROBNP in the last 8760 hours. HbA1C: No results for input(s): HGBA1C in the last 72 hours. CBG: No results for input(s): GLUCAP in the last 168 hours. Lipid Profile: No results for input(s): CHOL, HDL, LDLCALC, TRIG, CHOLHDL, LDLDIRECT in the last 72 hours. Thyroid Function Tests: Recent Labs    09/29/17 1826  TSH 0.799   Anemia Panel: No results for input(s): VITAMINB12, FOLATE, FERRITIN, TIBC, IRON, RETICCTPCT in the last 72 hours. Urine analysis:    Component Value Date/Time   COLORURINE YELLOW 06/24/2016 [place] 06/24/2016 0947   LABSPEC 1.020 06/24/2016 0947   PHURINE 5.0 06/24/2016 0947   GLUCOSEU NEGATIVE 06/24/2016 0947   HGBUR SMALL (A) 06/24/2016 0947   BILIRUBINUR NEGATIVE 06/24/2016 0947   KETONESUR 20 (A) 06/24/2016 0947   PROTEINUR NEGATIVE 06/24/2016 0947   UROBILINOGEN 0.2 05/07/2014 2101   NITRITE NEGATIVE 06/24/2016 0947   LEUKOCYTESUR NEGATIVE 06/24/2016 0947   Sepsis  Labs: @LABRCNTIP (procalcitonin:4,lacticidven:4)  )No results found for this or any previous visit (from the past 240 hour(s)).       Radiology Studies: Dg Chest Port 1 View  Result Date: 09/29/2017 CLINICAL DATA:  Hervey Ard mid chest pain that shoots into back/a fib/hx heart cath/pacemaker insertion EXAM: PORTABLE CHEST 1 VIEW COMPARISON:  04/18/2016 FINDINGS: Cardiac silhouette is normal in size. No mediastinal or hilar masses. No evidence of adenopathy. Lungs are hyperexpanded, but clear. No convincing pleural effusion. No pneumothorax. Left anterior chest wall sequential pacemaker is stable. Skeletal structures are grossly intact. IMPRESSION: No active disease. Electronically Signed   By: Lajean Manes M.D.   On: 09/29/2017 12:23        Scheduled Meds: . apixaban  2.5 mg Oral BID  . carbidopa-levodopa  1 tablet Oral QHS  . carbidopa-levodopa  1 tablet Oral TID WC  . fludrocortisone  100 mcg Oral Daily  . levothyroxine  75 mcg Oral QAC breakfast  . metoprolol tartrate  25 mg Oral BID  . rosuvastatin  10 mg Oral QHS  . sodium chloride flush  3 mL Intravenous Q12H   Continuous Infusions: . sodium chloride       LOS: 1 day  Time spent: 35 minutes. Greater than 50% of this time was spent in direct contact with the patient and with patient's family, coordinating care and discussing relevant ongoing clinical issues, including continued need for hospitalization to monitor heart rate and allow for careful dose adjustment of metoprolol given her history of orthostatic hypotension.     Lelon Frohlich, MD [facility] Pager [phone]  If 7PM-7AM, please contact night-coverage [email or web address] Password [email or web address] 09/30/2017, 12:19 PM

## 2017-10-01 MED ORDER — METOPROLOL TARTRATE 5 MG/5ML IV SOLN: 5.0000 mg | Freq: Four times a day (QID) | INTRAVENOUS | Status: DC | PRN

## 2017-10-01 MED ORDER — SODIUM CHLORIDE 0.9 % IV SOLN
INTRAVENOUS | Status: DC
  Administered 2017-10-01 – 2017-10-03 (×5): via INTRAVENOUS

## 2017-10-01 NOTE — Progress Notes (Signed)
PROGRESS NOTE    Abigail Taylor  LOV:[id number] DOB: [birth year] DOA: 09/29/2017 PCP: [name], MD     Brief Narrative:  84 year old woman admitted from home on 9/6 due to palpitations.  She has a history of atrial fibrillation chronically anticoagulated on Eliquis but not on rate controlling medications, presumably due to a history of orthostatic hypotension related to her Parkinson's disease.  She states she has been feeling unwell for about 8 weeks, on day of admission felt her chest beating really fast and thought that she might pass out which she never did, because of that she came to the hospital for evaluation.  Initial heart rates were in the 1 40-1 50 range.  After discussion on the telephone with Dr. Dorris Carnes admission was recommended, she recommended against initiation of IV Cardizem given her history of orthostatic hypotension and instead recommended metoprolol 25 mg twice daily.   Assessment & Plan:   Principal Problem:   Atrial fibrillation with RVR (HCC) Active Problems:   Hypothyroidism   Sinus pause, 7 seconds post conversion, S/P MDT pacemaker 07/05/11   Dyslipidemia   HTN (hypertension)   PD (Parkinson's disease) ([place])   Tachy-brady syndrome (HCC)   Orthostatic hypotension due to Parkinson's disease ([place])   Atrial fibrillation with rapid ventricular response -Initial heart rates in the 1 40-1 50 range. -She was not on any rate controlling medications at home, presumably due to history of orthostatic hypotension related to Parkinson's disease. -After discussion with Dr. Harrington Challenger, we have initiated metoprolol 25 mg p.o. twice daily. -Unfortunately today she has been noted to be significantly hypotensive with blood pressures in the 80/60 range, in fact had a near syncopal episode while I was in the room and I had to assist her back to the chair. -Have instructed RN to discontinue metoprolol and start IV fluids. -Given her difficult to control rates and difficulty  with hypotension, will request formal cardiology consultation when available in a.m.  Tachybradycardia syndrome -Status post pacemaker  History of orthostatic hypotension due to Parkinson's disease -Continue Florinef, blood pressure remains stable.  Hypothyroidism -TSH is within normal limits at 0.799, continue home dose of Synthroid.   DVT prophylaxis: Fully anticoagulated on Eliquis Code Status: Full code Family Communication: Husband at bedside updated on plan of care and all questions answered Disposition Plan: Pending cardiology recommendations and improvement in heart rates.  Consultants:   Curbside telephone consultation with cardiology, Dr. Harrington Challenger on 9/6.  Procedures:   2D echo pending  Antimicrobials:  Anti-infectives (From admission, onward)   None       Subjective: When I came into the room she was coming out of the bathroom after attempted to have a bowel movement.  She said that she felt very dizzy and started swaying, I assisted her back to her recliner, lifted her legs up.  Objective: Vitals:   09/30/17 2058 10/01/17 0534 10/01/17 1245 10/01/17 1407  BP:  (!) 89/62 (!) 84/55 (!) 88/63  Pulse:  87  79  Resp:  18  20  Temp:  97.6 F (36.4 C)  98.2 F (36.8 C)  TempSrc:  Oral    SpO2: 95% 100%  100%  Weight:      Height:        Intake/Output Summary (Last 24 hours) at 10/01/2017 1435 Last data filed at 10/01/2017 1234 Gross per 24 hour  Intake 480 ml  Output -  Net 480 ml   Filed Weights   09/29/17 1101  Weight: 55.3 kg  Examination:  General exam: Alert, awake, oriented x 3 Respiratory system: Clear to auscultation. Respiratory effort normal. Cardiovascular system:RRR. No murmurs, rubs, gallops. Gastrointestinal system: Abdomen is nondistended, soft and nontender. No organomegaly or masses felt. Normal bowel sounds heard. Central nervous system: Alert and oriented. No focal neurological deficits. Extremities: No C/C/E, +pedal pulses Skin:  No rashes, lesions or ulcers Psychiatry: Judgement and insight appear normal. Mood & affect appropriate.      Data Reviewed: I have personally reviewed following labs and imaging studies  CBC: Recent Labs  Lab 09/29/17 1115 09/30/17 0610  WBC 9.6 8.2  HGB 14.2 13.9  HCT 44.6 43.8  MCV 89.2 89.4  PLT 208 063   Basic Metabolic Panel: Recent Labs  Lab 09/29/17 1115 09/30/17 0610  NA 143 144  K 3.9 4.2  CL 109 110  CO2 27 23  GLUCOSE 98 98  BUN 17 17  CREATININE 0.84 0.84  CALCIUM 9.0 8.9   GFR: Estimated Creatinine Clearance: 43.5 mL/min (by C-G formula based on SCr of 0.84 mg/dL). Liver Function Tests: No results for input(s): AST, ALT, ALKPHOS, BILITOT, PROT, ALBUMIN in the last 168 hours. No results for input(s): LIPASE, AMYLASE in the last 168 hours. No results for input(s): AMMONIA in the last 168 hours. Coagulation Profile: No results for input(s): INR, PROTIME in the last 168 hours. Cardiac Enzymes: Recent Labs  Lab 09/29/17 1115 09/29/17 1826 09/30/17 0006 09/30/17 0610  TROPONINI 0.06* 0.07* 0.06* 0.06*   BNP (last 3 results) No results for input(s): PROBNP in the last 8760 hours. HbA1C: No results for input(s): HGBA1C in the last 72 hours. CBG: No results for input(s): GLUCAP in the last 168 hours. Lipid Profile: No results for input(s): CHOL, HDL, LDLCALC, TRIG, CHOLHDL, LDLDIRECT in the last 72 hours. Thyroid Function Tests: Recent Labs    09/29/17 1826  TSH 0.799   Anemia Panel: No results for input(s): VITAMINB12, FOLATE, FERRITIN, TIBC, IRON, RETICCTPCT in the last 72 hours. Urine analysis:    Component Value Date/Time   COLORURINE YELLOW 06/24/2016 [place] 06/24/2016 0947   LABSPEC 1.020 06/24/2016 0947   PHURINE 5.0 06/24/2016 0947   GLUCOSEU NEGATIVE 06/24/2016 0947   HGBUR SMALL (A) 06/24/2016 0947   BILIRUBINUR NEGATIVE 06/24/2016 0947   KETONESUR 20 (A) 06/24/2016 0947   PROTEINUR NEGATIVE 06/24/2016 0947    UROBILINOGEN 0.2 05/07/2014 2101   NITRITE NEGATIVE 06/24/2016 0947   LEUKOCYTESUR NEGATIVE 06/24/2016 0947   Sepsis Labs: @LABRCNTIP (procalcitonin:4,lacticidven:4)  )No results found for this or any previous visit (from the past 240 hour(s)).       Radiology Studies: No results found.      Scheduled Meds: . apixaban  2.5 mg Oral BID  . carbidopa-levodopa  1 tablet Oral QHS  . carbidopa-levodopa  1 tablet Oral TID WC  . fludrocortisone  100 mcg Oral Daily  . levothyroxine  75 mcg Oral QAC breakfast  . rosuvastatin  10 mg Oral QHS  . sodium chloride flush  3 mL Intravenous Q12H   Continuous Infusions: . sodium chloride    . sodium chloride 75 mL/hr at 10/01/17 1240     LOS: 2 days    Time spent: 25 minutes.     Lelon Frohlich, MD [facility] Pager [phone]  If 7PM-7AM, please contact night-coverage [email or web address] Password [email or web address] 10/01/2017, 2:35 PM

## 2017-10-02 DIAGNOSIS — I4891 Unspecified atrial fibrillation: Secondary | ICD-10-CM

## 2017-10-02 LAB — BASIC METABOLIC PANEL
Anion gap: 8 (ref 5–15)
BUN: 17 mg/dL (ref 8–23)
CHLORIDE: 110 mmol/L (ref 98–111)
CO2: 24 mmol/L (ref 22–32)
Calcium: 8.5 mg/dL — ABNORMAL LOW (ref 8.9–10.3)
Creatinine, Ser: 0.85 mg/dL (ref 0.44–1.00)
GFR calc Af Amer: >60 mL/min (ref >60)
Glucose, Bld: 98 mg/dL (ref 70–99)
Potassium: 4.3 mmol/L (ref 3.5–5.1)
SODIUM: 142 mmol/L (ref 135–145)

## 2017-10-02 MED ORDER — AMIODARONE HCL 200 MG PO TABS
200.0000 mg | ORAL_TABLET | Freq: Two times a day (BID) | ORAL | Status: DC
  Administered 2017-10-02 (×2): 200 mg via ORAL
  Filled 2017-10-02 (×3): qty 1

## 2017-10-02 NOTE — Consult Note (Signed)
Cardiology Consultation:   Patient ID: Abigail Taylor MRN: [id number]; DOB: 1931-08-30  Admit date: 09/29/2017 Date of Consult: 10/02/2017  Primary Care Provider: [name], MD Primary Cardiologist: Dr [name] Primary Electrophysiologist:  na   Patient Profile:   Abigail Taylor is a 84 y.o. female with a hx of afib, pacemaker, orthostatic hypotension who is being seen today for the evaluation of afib with RVR at the request of Dr Isaac Bliss.  History of Present Illness:   Abigail Taylor 84 yo female history of sinus node dysfunction, PAF, tachy brady syndrome now with dual chamber medtronic pacemaker, orthostatic hypotension related to Parkinsons disease on florinef. From Dr Victorino December notes have been avoiding av nodal agents due to her orthostatic hypotension history, have tolerated previously mild infrequency symptoms.   Admitted with palpitations. Found to be in afib with RVR. Started on lopressor 25mg  bid at beginning of admission. Rates improved, but she had a significant episode of hypotension and near syncope, lopressor stopped     09/2017 echo LVEF 50-55%, no WMAs, mild AI, mild MR.    Past Medical History:  Diagnosis Date  . Angina at rest, with the tachycardia 07/05/2011  . Aortic insufficiency 07/05/2011  . Atrial fibrillation ([place])   . Brady-tachy syndrome ([place])    Dual chamber Medtronic pacemaker Adapta  . Chronic back pain   . Constipation 12/27/2011  . Gait instability   . Hyperlipidemia   . Hypothyroidism   . Normal cardiac stress test 2013  . OA (osteoarthritis)   . Pacemaker   . Parkinson's disease ([place])   . Tremor     Past Surgical History:  Procedure Laterality Date  . ABDOMINAL HYSTERECTOMY    . CARDIAC CATHETERIZATION  05/26/1999   normal  . CARDIAC CATHETERIZATION  2001   normal coronary arteries  . cataract    . COLONOSCOPY N/A 11/15/2012   Procedure: COLONOSCOPY;  Surgeon: [name], MD;  [facility];  Service:  Endoscopy;  Laterality: N/A;  1030  . HIP ARTHROPLASTY Left 06/24/2016   Procedure: ARTHROPLASTY BIPOLAR HIP (HEMIARTHROPLASTY);  Surgeon: Altamese Hansville, MD;  Location: [facility];  Service: Orthopedics;  Laterality: Left;  . INSERT / REPLACE / REMOVE PACEMAKER    . NM MYOVIEW LTD  07/19/2011   normal  . PERMANENT PACEMAKER INSERTION  07/05/2011   Medtronic Adapta dual chamber  . PERMANENT PACEMAKER INSERTION N/A 07/05/2011   Procedure: PERMANENT PACEMAKER INSERTION;  Surgeon: [name], MD;  Location: [facility];  Service: Cardiovascular;  Laterality: N/A;     Inpatient Medications: Scheduled Meds: . apixaban  2.5 mg Oral BID  . carbidopa-levodopa  1 tablet Oral QHS  . carbidopa-levodopa  1 tablet Oral TID WC  . fludrocortisone  100 mcg Oral Daily  . levothyroxine  75 mcg Oral QAC breakfast  . rosuvastatin  10 mg Oral QHS  . sodium chloride flush  3 mL Intravenous Q12H   Continuous Infusions: . sodium chloride    . sodium chloride 75 mL/hr at 10/02/17 0006   PRN Meds: sodium chloride, acetaminophen **OR** acetaminophen, metoprolol tartrate, ondansetron **OR** ondansetron (ZOFRAN) IV, senna-docusate, sodium chloride flush  Allergies:   No Known Allergies  Social History:   Social History   Socioeconomic History  . Marital status: Married    Spouse name: Not on file  . Number of children: Not on file  . Years of education: Not on file  . Highest education level: Not on file  Occupational History  .  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 Financial resource strain: Not on file  . Food insecurity:    Worry: Not on file    Inability: Not on file  . Transportation needs:    Medical: Not on file    Non-medical: Not on file  Tobacco Use  . Smoking status: Never Smoker  . Smokeless tobacco: Never Used  Substance and Sexual Activity  . Alcohol use: No    Alcohol/week: 0.0 standard drinks  . Drug use: No  . Sexual activity: Not on file    Lifestyle  . Physical activity:    Days per week: Not on file    Minutes per session: Not on file  . Stress: Not on file  Relationships  . Social connections:    Talks on phone: Not on file    Gets together: Not on file    Attends religious service: Not on file    Active member of club or organization: Not on file    Attends meetings of clubs or organizations: Not on file    Relationship status: Not on file  . Intimate partner violence:    Fear of current or ex partner: Not on file    Emotionally abused: Not on file    Physically abused: Not on file    Forced sexual activity: Not on file  Other Topics Concern  . Not on file  Social History Narrative  . Not on file    Family History:    Family History  Problem Relation Age of Onset  . Colon cancer Mother 8  . Heart attack Mother   . Cancer Father   . Suicidality Son      ROS:  Please see the history of present illness.  All other ROS reviewed and negative.     Physical Exam/Data:   Vitals:   10/01/17 1407 10/01/17 2035 10/01/17 2121 10/02/17 0632  BP: (!) 88/63  121/90 (!) 105/54  Pulse: 79  99 (!) 54  Resp: 20  18 18   Temp: 98.2 F (36.8 C)  (!) 97.5 F (36.4 C) 98.1 F (36.7 C)  TempSrc:   Oral Oral  SpO2: 100% 94% 100% 100%  Weight:      Height:        Intake/Output Summary (Last 24 hours) at 10/02/2017 0846 Last data filed at 10/01/2017 1900 Gross per 24 hour  Intake 895 ml  Output -  Net 895 ml   Filed Weights   09/29/17 1101  Weight: 55.3 kg   Body mass index is 19.69 kg/m.  General:  Well nourished, well developed, in no acute distress HEENT: normal Lymph: no adenopathy Neck: no JVD Endocrine:  No thryomegaly Vascular: No carotid bruits; FA pulses 2+ bilaterally without bruits  Cardiac:  Irreg, tachy Lungs:  clear to auscultation bilaterally, no wheezing, rhonchi or rales  Abd: soft, nontender, no hepatomegaly  Ext: no edema Musculoskeletal:  No deformities, BUE and BLE strength normal  and equal Skin: warm and dry  Neuro:  CNs 2-12 intact, no focal abnormalities noted Psych:  Normal affect    Laboratory Data:  Chemistry Recent Labs  Lab 09/29/17 1115 09/30/17 0610 10/02/17 0456  NA 143 144 142  K 3.9 4.2 4.3  CL 109 110 110  CO2 27 23 24   GLUCOSE 98 98 98  BUN 17 17 17   CREATININE 0.84 0.84 0.85  CALCIUM 9.0 8.9 8.5*  GFRNONAA >60 >60 >60  GFRAA >60 >  60 >60  ANIONGAP 7 11 8     No results for input(s): PROT, ALBUMIN, AST, ALT, ALKPHOS, BILITOT in the last 168 hours. Hematology Recent Labs  Lab 09/29/17 1115 09/30/17 0610  WBC 9.6 8.2  RBC 5.00 4.90  HGB 14.2 13.9  HCT 44.6 43.8  MCV 89.2 89.4  MCH 28.4 28.4  MCHC 31.8 31.7  RDW 13.9 14.1  PLT 208 207   Cardiac Enzymes Recent Labs  Lab 09/29/17 1115 09/29/17 1826 09/30/17 0006 09/30/17 0610  TROPONINI 0.06* 0.07* 0.06* 0.06*   No results for input(s): TROPIPOC in the last 168 hours.  BNP Recent Labs  Lab 09/29/17 1115  BNP 692.0*    DDimer No results for input(s): DDIMER in the last 168 hours.  Radiology/Studies:  Dg Chest Port 1 View  Result Date: 09/29/2017 CLINICAL DATA:  Hervey Ard mid chest pain that shoots into back/a fib/hx heart cath/pacemaker insertion EXAM: PORTABLE CHEST 1 VIEW COMPARISON:  04/18/2016 FINDINGS: Cardiac silhouette is normal in size. No mediastinal or hilar masses. No evidence of adenopathy. Lungs are hyperexpanded, but clear. No convincing pleural effusion. No pneumothorax. Left anterior chest wall sequential pacemaker is stable. Skeletal structures are grossly intact. IMPRESSION: No active disease. Electronically Signed   By: Lajean Manes M.D.   On: 09/29/2017 12:23    Assessment and Plan:   1. Afib - difficult management in setting of significant orthostatic hypotension. Trial of low dose lopressor this admission led to significant hypotension and near syncope - given inability to use av nodal agents in combination, would avoid class IC antiarrhythmics -  from Dr Lurline Del note 12/2014 if afib prevalence increases would consider amio or dofetilide.  - we will start amio 200mg  bid x 4 weeks, then 200mg  daily. Would monitor inpatient today how she tolerates given mild beta blocker properties of amio - conitnue anticoag.   - start oral amio load, if persistent tachycardia may need to transition to IV load. If transitioned to IV would avoid the 150mg  bolus given her sensitive hemodynamics.    2. Orthostatic hypotension - avoid bp lowering agents - room to titrate florinef if needed    For questions or updates, please contact [facility] Please consult [email or web address] for contact info under     Signed, Carlyle Dolly, MD  10/02/2017 8:46 AM

## 2017-10-02 NOTE — Progress Notes (Signed)
PROGRESS NOTE    Abigail Taylor  TFT:[id number] DOB: [birth year] DOA: 09/29/2017 PCP: [name], MD     Brief Narrative:  84 year old woman admitted from home on 9/6 due to palpitations.  She has a history of atrial fibrillation chronically anticoagulated on Eliquis but not on rate controlling medications, presumably due to a history of orthostatic hypotension related to her Parkinson's disease.  She states she has been feeling unwell for about 8 weeks, on day of admission felt her chest beating really fast and thought that she might pass out which she never did, because of that she came to the hospital for evaluation.  Initial heart rates were in the 1 40-1 50 range.  After discussion on the telephone with Dr. Dorris Carnes admission was recommended, she recommended against initiation of IV Cardizem given her history of orthostatic hypotension and instead recommended metoprolol 25 mg twice daily.   Assessment & Plan:   Principal Problem:   Atrial fibrillation with RVR (HCC) Active Problems:   Hypothyroidism   Sinus pause, 7 seconds post conversion, S/P MDT pacemaker 07/05/11   Dyslipidemia   HTN (hypertension)   PD (Parkinson's disease) ([place])   Tachy-brady syndrome (HCC)   Orthostatic hypotension due to Parkinson's disease ([place])   Atrial fibrillation with rapid ventricular response -Initial heart rates in the 1 40-1 50 range. -She was not on any rate controlling medications at home, presumably due to history of orthostatic hypotension related to Parkinson's disease. -After discussion with Dr. Harrington Challenger, we have initiated metoprolol 25 mg p.o. twice daily. -Unfortunately on 9/8 she was noted to be significantly hypotensive with blood pressures in the 80/60 range, in fact had a near syncopal episode while I was in the room and I had to assist her back to the chair. -Metoprolol was discontinued. -I requested cardiology's assistance.  They have decided to start amiodarone load. -We will  keep in the hospital another night to evaluate heart rates.  Tachybradycardia syndrome -Status post pacemaker  History of orthostatic hypotension due to Parkinson's disease -Continue Florinef, with significant hypotension on 9/8 after initiation of metoprolol.  Hypothyroidism -TSH is within normal limits at 0.799, continue home dose of Synthroid.   DVT prophylaxis: Fully anticoagulated on Eliquis Code Status: Full code Family Communication: Husband at bedside updated on plan of care and all questions answered Disposition Plan: Pending  improvement in heart rates.  Consultants:   Cardiology  Procedures:   2D echo: Showed fraction of 50 to 55%, normal wall motion.  Antimicrobials:  Anti-infectives (From admission, onward)   None       Subjective: Lying in bed, no complaints, anxious to discharge home.  Objective: Vitals:   10/01/17 1407 10/01/17 2035 10/01/17 2121 10/02/17 0632  BP: (!) 88/63  121/90 (!) 105/54  Pulse: 79  99 (!) 54  Resp: 20  18 18   Temp: 98.2 F (36.8 C)  (!) 97.5 F (36.4 C) 98.1 F (36.7 C)  TempSrc:   Oral Oral  SpO2: 100% 94% 100% 100%  Weight:      Height:        Intake/Output Summary (Last 24 hours) at 10/02/2017 1210 Last data filed at 10/02/2017 1100 Gross per 24 hour  Intake 898 ml  Output -  Net 898 ml   Filed Weights   09/29/17 1101  Weight: 55.3 kg    Examination:  General exam: Alert, awake, oriented x 3 Respiratory system: Clear to auscultation. Respiratory effort normal. Cardiovascular system: Irregularly irregular. No murmurs, rubs, gallops.  Gastrointestinal system: Abdomen is nondistended, soft and nontender. No organomegaly or masses felt. Normal bowel sounds heard. Central nervous system: Alert and oriented. No focal neurological deficits. Extremities: No C/C/E, +pedal pulses Skin: No rashes, lesions or ulcers Psychiatry: Judgement and insight appear normal. Mood & affect appropriate.        Data Reviewed:  I have personally reviewed following labs and imaging studies  CBC: Recent Labs  Lab 09/29/17 1115 09/30/17 0610  WBC 9.6 8.2  HGB 14.2 13.9  HCT 44.6 43.8  MCV 89.2 89.4  PLT 208 379   Basic Metabolic Panel: Recent Labs  Lab 09/29/17 1115 09/30/17 0610 10/02/17 0456  NA 143 144 142  K 3.9 4.2 4.3  CL 109 110 110  CO2 27 23 24   GLUCOSE 98 98 98  BUN 17 17 17   CREATININE 0.84 0.84 0.85  CALCIUM 9.0 8.9 8.5*   GFR: Estimated Creatinine Clearance: 43 mL/min (by C-G formula based on SCr of 0.85 mg/dL). Liver Function Tests: No results for input(s): AST, ALT, ALKPHOS, BILITOT, PROT, ALBUMIN in the last 168 hours. No results for input(s): LIPASE, AMYLASE in the last 168 hours. No results for input(s): AMMONIA in the last 168 hours. Coagulation Profile: No results for input(s): INR, PROTIME in the last 168 hours. Cardiac Enzymes: Recent Labs  Lab 09/29/17 1115 09/29/17 1826 09/30/17 0006 09/30/17 0610  TROPONINI 0.06* 0.07* 0.06* 0.06*   BNP (last 3 results) No results for input(s): PROBNP in the last 8760 hours. HbA1C: No results for input(s): HGBA1C in the last 72 hours. CBG: No results for input(s): GLUCAP in the last 168 hours. Lipid Profile: No results for input(s): CHOL, HDL, LDLCALC, TRIG, CHOLHDL, LDLDIRECT in the last 72 hours. Thyroid Function Tests: Recent Labs    09/29/17 1826  TSH 0.799   Anemia Panel: No results for input(s): VITAMINB12, FOLATE, FERRITIN, TIBC, IRON, RETICCTPCT in the last 72 hours. Urine analysis:    Component Value Date/Time   COLORURINE YELLOW 06/24/2016 [place] 06/24/2016 0947   LABSPEC 1.020 06/24/2016 0947   PHURINE 5.0 06/24/2016 0947   GLUCOSEU NEGATIVE 06/24/2016 0947   HGBUR SMALL (A) 06/24/2016 0947   BILIRUBINUR NEGATIVE 06/24/2016 0947   KETONESUR 20 (A) 06/24/2016 0947   PROTEINUR NEGATIVE 06/24/2016 0947   UROBILINOGEN 0.2 05/07/2014 2101   NITRITE NEGATIVE 06/24/2016 0947    LEUKOCYTESUR NEGATIVE 06/24/2016 0947   Sepsis Labs: @LABRCNTIP (procalcitonin:4,lacticidven:4)  )No results found for this or any previous visit (from the past 240 hour(s)).       Radiology Studies: No results found.      Scheduled Meds: . amiodarone  200 mg Oral BID  . apixaban  2.5 mg Oral BID  . carbidopa-levodopa  1 tablet Oral QHS  . carbidopa-levodopa  1 tablet Oral TID WC  . fludrocortisone  100 mcg Oral Daily  . levothyroxine  75 mcg Oral QAC breakfast  . rosuvastatin  10 mg Oral QHS  . sodium chloride flush  3 mL Intravenous Q12H   Continuous Infusions: . sodium chloride    . sodium chloride 75 mL/hr at 10/02/17 0006     LOS: 3 days    Time spent: 25 minutes.     Lelon Frohlich, MD [facility] Pager [phone]  If 7PM-7AM, please contact night-coverage [email or web address] Password [email or web address] 10/02/2017, 12:10 PM

## 2017-10-02 NOTE — Care Management Important Message (Signed)
Important Message  Patient Details  Name: Abigail Taylor MRN: [id number] Date of Birth: 07-Oct-1931   Medicare Important Message Given:  Yes    Shelda Altes 10/02/2017, 1:21 PM

## 2017-10-03 LAB — MRSA PCR SCREENING: MRSA by PCR: NEGATIVE

## 2017-10-03 LAB — HEPATIC FUNCTION PANEL
ALT: 8 U/L (ref 0–44)
AST: 22 U/L (ref 15–41)
Albumin: 3.5 g/dL (ref 3.5–5.0)
Alkaline Phosphatase: 76 U/L (ref 38–126)
Bilirubin, Direct: 0.1 mg/dL (ref 0.0–0.2)
Indirect Bilirubin: 0.4 mg/dL (ref 0.3–0.9)
Total Bilirubin: 0.5 mg/dL (ref 0.3–1.2)
Total Protein: 5.7 g/dL — ABNORMAL LOW (ref 6.5–8.1)

## 2017-10-03 MED ORDER — AMIODARONE HCL IN DEXTROSE 360-4.14 MG/200ML-% IV SOLN
30.0000 mg/h | INTRAVENOUS | Status: AC
  Administered 2017-10-03: 60 mg/h via INTRAVENOUS
  Administered 2017-10-04: 30 mg/h via INTRAVENOUS
  Filled 2017-10-03 (×5): qty 200

## 2017-10-03 MED ORDER — AMIODARONE HCL IN DEXTROSE 360-4.14 MG/200ML-% IV SOLN
60.0000 mg/h | INTRAVENOUS | Status: AC
  Administered 2017-10-03: 60 mg/h via INTRAVENOUS
  Filled 2017-10-03 (×2): qty 200

## 2017-10-03 NOTE — Progress Notes (Signed)
PROGRESS NOTE    Abigail Taylor  JOA:[id number] DOB: 1931/10/27 DOA: 09/29/2017 PCP: [name], MD     Brief Narrative:  84 year old woman admitted from home on 9/6 due to palpitations.  She has a history of atrial fibrillation chronically anticoagulated on Eliquis but not on rate controlling medications, presumably due to a history of orthostatic hypotension related to her Parkinson's disease.  She states she has been feeling unwell for about 8 weeks, on day of admission felt her chest beating really fast and thought that she might pass out which she never did, because of that she came to the hospital for evaluation.  Initial heart rates were in the 1 40-1 50 range.  After discussion on the telephone with Dr. Dorris Carnes admission was recommended, she recommended against initiation of IV Cardizem given her history of orthostatic hypotension and instead recommended metoprolol 25 mg twice daily.   Assessment & Plan:   Principal Problem:   Atrial fibrillation with RVR (HCC) Active Problems:   Hypothyroidism   Sinus pause, 7 seconds post conversion, S/P MDT pacemaker 07/05/11   Dyslipidemia   HTN (hypertension)   PD (Parkinson's disease) ([place])   Tachy-brady syndrome (HCC)   Orthostatic hypotension due to Parkinson's disease ([place])   Atrial fibrillation with rapid ventricular response -Initial heart rates in the 1 40-1 50 range. -She was not on any rate controlling medications at home, presumably due to history of orthostatic hypotension related to Parkinson's disease. -After discussion with Dr. Harrington Challenger, we have initiated metoprolol 25 mg p.o. twice daily. -Unfortunately on 9/8 she was noted to be significantly hypotensive with blood pressures in the 80/60 range, in fact had a near syncopal episode while I was in the room and I had to assist her back to the chair. -Metoprolol was discontinued. -I requested cardiology's assistance.   -Due to rates that remain elevated despite oral  ambulated, cardiology has decided today to transfer to the ICU and initiate IV amiodarone load in hopes of improving her heart rate control.  Tachybradycardia syndrome -Status post pacemaker  History of orthostatic hypotension due to Parkinson's disease -Continue Florinef, with significant hypotension on 9/8 after initiation of metoprolol.  Hypothyroidism -TSH is within normal limits at 0.799, continue home dose of Synthroid.   DVT prophylaxis: Fully anticoagulated on Eliquis Code Status: Full code Family Communication: Patient only  Disposition Plan: Pending  improvement in heart rates.  Consultants:   Cardiology  Procedures:   2D echo: Showed ejection fraction of 50 to 55%, normal wall motion.  Antimicrobials:  Anti-infectives (From admission, onward)   None       Subjective: Lying in bed, no complaints, anxious to discharge home.  Objective: Vitals:   10/03/17 0650 10/03/17 1026 10/03/17 1100 10/03/17 1239  BP: (!) 144/81     Pulse: 83     Resp: 18   16  Temp: 97.7 F (36.5 C) (!) 97.3 F (36.3 C) (!) 96.6 F (35.9 C) (!) 97.5 F (36.4 C)  TempSrc: Oral Axillary Rectal Oral  SpO2: 100%     Weight:  61.2 kg    Height:  5\' 6"  (1.676 m)      Intake/Output Summary (Last 24 hours) at 10/03/2017 1417 Last data filed at 10/03/2017 1033 Gross per 24 hour  Intake 1306.11 ml  Output -  Net 1306.11 ml   Filed Weights   09/29/17 1101 10/03/17 1026  Weight: 55.3 kg 61.2 kg    Examination:  General exam: Alert, awake, oriented x 3,  frail Respiratory system: Clear to auscultation. Respiratory effort normal. Cardiovascular system: Tachycardic, irregular rhythm. No murmurs, rubs, gallops. Gastrointestinal system: Abdomen is nondistended, soft and nontender. No organomegaly or masses felt. Normal bowel sounds heard. Central nervous system: Alert and oriented. No focal neurological deficits. Extremities: No C/C/E, +pedal pulses Skin: No rashes, lesions or  ulcers Psychiatry: Judgement and insight appear normal. Mood & affect appropriate.          Data Reviewed: I have personally reviewed following labs and imaging studies  CBC: Recent Labs  Lab 09/29/17 1115 09/30/17 0610  WBC 9.6 8.2  HGB 14.2 13.9  HCT 44.6 43.8  MCV 89.2 89.4  PLT 208 607   Basic Metabolic Panel: Recent Labs  Lab 09/29/17 1115 09/30/17 0610 10/02/17 0456  NA 143 144 142  K 3.9 4.2 4.3  CL 109 110 110  CO2 27 23 24   GLUCOSE 98 98 98  BUN 17 17 17   CREATININE 0.84 0.84 0.85  CALCIUM 9.0 8.9 8.5*   GFR: Estimated Creatinine Clearance: 46.1 mL/min (by C-G formula based on SCr of 0.85 mg/dL). Liver Function Tests: Recent Labs  Lab 10/03/17 1027  AST 22  ALT 8  ALKPHOS 76  BILITOT 0.5  PROT 5.7*  ALBUMIN 3.5   No results for input(s): LIPASE, AMYLASE in the last 168 hours. No results for input(s): AMMONIA in the last 168 hours. Coagulation Profile: No results for input(s): INR, PROTIME in the last 168 hours. Cardiac Enzymes: Recent Labs  Lab 09/29/17 1115 09/29/17 1826 09/30/17 0006 09/30/17 0610  TROPONINI 0.06* 0.07* 0.06* 0.06*   BNP (last 3 results) No results for input(s): PROBNP in the last 8760 hours. HbA1C: No results for input(s): HGBA1C in the last 72 hours. CBG: No results for input(s): GLUCAP in the last 168 hours. Lipid Profile: No results for input(s): CHOL, HDL, LDLCALC, TRIG, CHOLHDL, LDLDIRECT in the last 72 hours. Thyroid Function Tests: No results for input(s): TSH, T4TOTAL, FREET4, T3FREE, THYROIDAB in the last 72 hours. Anemia Panel: No results for input(s): VITAMINB12, FOLATE, FERRITIN, TIBC, IRON, RETICCTPCT in the last 72 hours. Urine analysis:    Component Value Date/Time   COLORURINE YELLOW 06/24/2016 [place] 06/24/2016 0947   LABSPEC 1.020 06/24/2016 0947   PHURINE 5.0 06/24/2016 0947   GLUCOSEU NEGATIVE 06/24/2016 0947   HGBUR SMALL (A) 06/24/2016 0947   BILIRUBINUR NEGATIVE  06/24/2016 0947   KETONESUR 20 (A) 06/24/2016 0947   PROTEINUR NEGATIVE 06/24/2016 0947   UROBILINOGEN 0.2 05/07/2014 2101   NITRITE NEGATIVE 06/24/2016 0947   LEUKOCYTESUR NEGATIVE 06/24/2016 0947   Sepsis Labs: @LABRCNTIP (procalcitonin:4,lacticidven:4)  )No results found for this or any previous visit (from the past 240 hour(s)).       Radiology Studies: No results found.      Scheduled Meds: . apixaban  2.5 mg Oral BID  . carbidopa-levodopa  1 tablet Oral QHS  . carbidopa-levodopa  1 tablet Oral TID WC  . fludrocortisone  100 mcg Oral Daily  . levothyroxine  75 mcg Oral QAC breakfast  . rosuvastatin  10 mg Oral QHS  . sodium chloride flush  3 mL Intravenous Q12H   Continuous Infusions: . sodium chloride    . sodium chloride 75 mL/hr at 10/03/17 1033  . amiodarone 60 mg/hr (10/03/17 1033)  . amiodarone       LOS: 4 days    Time spent: 25 minutes.     Lelon Frohlich, MD [facility] Pager [phone]  If 7PM-7AM,  please contact night-coverage [email or web address] Password [email or web address] 10/03/2017, 2:17 PM

## 2017-10-03 NOTE — Progress Notes (Signed)
Transferred patient to ICU. Gave report to Tiffany.

## 2017-10-03 NOTE — Progress Notes (Addendum)
Progress Note  Patient Name: Abigail Taylor Date of Encounter: 10/03/2017  Primary Cardiologist: [name], MD   Subjective   Denies any chest pain or palpitations overnight or this morning. No dizziness. Overall feeling well.   Inpatient Medications    Scheduled Meds: . amiodarone  200 mg Oral BID  . apixaban  2.5 mg Oral BID  . carbidopa-levodopa  1 tablet Oral QHS  . carbidopa-levodopa  1 tablet Oral TID WC  . fludrocortisone  100 mcg Oral Daily  . levothyroxine  75 mcg Oral QAC breakfast  . rosuvastatin  10 mg Oral QHS  . sodium chloride flush  3 mL Intravenous Q12H   Continuous Infusions: . sodium chloride    . sodium chloride 75 mL/hr at 10/03/17 0630   PRN Meds: sodium chloride, acetaminophen **OR** acetaminophen, ondansetron **OR** ondansetron (ZOFRAN) IV, senna-docusate, sodium chloride flush   Vital Signs    Vitals:   10/02/17 1400 10/02/17 2009 10/02/17 2127 10/03/17 0650  BP: 92/65  115/84 (!) 144/81  Pulse: (!) 52  62 83  Resp: 18  20 18   Temp: 97.6 F (36.4 C)  98.2 F (36.8 C) 97.7 F (36.5 C)  TempSrc: Oral  Oral Oral  SpO2: 95% 99% 100% 100%  Weight:      Height:        Intake/Output Summary (Last 24 hours) at 10/03/2017 0758 Last data filed at 10/03/2017 0700 Gross per 24 hour  Intake 1540.5 ml  Output -  Net 1540.5 ml   Filed Weights   09/29/17 1101  Weight: 55.3 kg    Telemetry    Atrial fibrillation, HR in 110's to 130's, peaking into 150's. - Personally Reviewed  ECG    No new tracings.   Physical Exam   General: Well developed, well nourished Caucasian female appearing in no acute distress. Head: Normocephalic, atraumatic.  Neck: Supple without bruits, JVD not elevated. Lungs:  Resp regular and unlabored, CTA without wheezing or rales. Heart: Irregularly irregular, S1, S2, no S3, S4, or murmur; no rub. Abdomen: Soft, non-tender, non-distended with normoactive bowel sounds. No hepatomegaly. No rebound/guarding. No  obvious abdominal masses. Extremities: No clubbing, cyanosis, or lower extremity edema. Distal pedal pulses are 2+ bilaterally. Neuro: Alert and oriented X 3. Moves all extremities spontaneously. Psych: Normal affect.  Labs    Chemistry Recent Labs  Lab 09/29/17 1115 09/30/17 0610 10/02/17 0456  NA 143 144 142  K 3.9 4.2 4.3  CL 109 110 110  CO2 27 23 24   GLUCOSE 98 98 98  BUN 17 17 17   CREATININE 0.84 0.84 0.85  CALCIUM 9.0 8.9 8.5*  GFRNONAA >60 >60 >60  GFRAA >60 >60 >60  ANIONGAP 7 11 8      Hematology Recent Labs  Lab 09/29/17 1115 09/30/17 0610  WBC 9.6 8.2  RBC 5.00 4.90  HGB 14.2 13.9  HCT 44.6 43.8  MCV 89.2 89.4  MCH 28.4 28.4  MCHC 31.8 31.7  RDW 13.9 14.1  PLT 208 207    Cardiac Enzymes Recent Labs  Lab 09/29/17 1115 09/29/17 1826 09/30/17 0006 09/30/17 0610  TROPONINI 0.06* 0.07* 0.06* 0.06*   No results for input(s): TROPIPOC in the last 168 hours.   BNP Recent Labs  Lab 09/29/17 1115  BNP 692.0*     DDimer No results for input(s): DDIMER in the last 168 hours.   Radiology    No results found.  Cardiac Studies   Echocardiogram: 09/30/2017 Study Conclusions  - Left ventricle:  The cavity size was normal. Wall thickness was   normal. Systolic function was normal. The estimated ejection   fraction was in the range of 50% to 55%. Wall motion was normal;   there were no regional wall motion abnormalities. - Aortic valve: There was mild regurgitation. - Mitral valve: There was mild regurgitation. - Left atrium: The atrium was moderately dilated. - Right atrium: The atrium was mildly dilated.  Impressions:  - Normal LV systolic function; mild AI; mild MR; biatrial   enlargement; mild TR.  Patient Profile     84 y.o. female w/ PMH of paroxysmal atrial fibrillation (on Eliquis for anticoagulation), sinus node dysfunction (s/p Medtronic PPM placement in 2012), orthostatic hypotension, Parkinson's Disease, and HLD who presented  to [facility] ED on 09/29/2017 for evaluation of palpitations and weakness, found to be in atrial fibrillation with RVR.   Assessment & Plan    1. Atrial Fibrillation with RVR - the patient presented with worsening weakness and palpitations, found to be in atrial fibrillation with RVR. AV nodal blocking agents were previously avoided given history of hypotension and presyncope, therefore she has been initiated on Amiodarone. Started on PO loading at 200mg  BID over IV loading due to hypotension in the past. Received two doses yesterday and HR is still in the 120's to 130's. BP has been stable but she is also receiving IVF at 75 mL/hr. No evidence of volume overload by examination.  - will discuss with Dr. Harl Bowie, but would anticipate starting IV Amiodarone given her rates remain significantly elevated at rest. No bolus given soft BP.  - remains on Eliquis 2.5mg  BID for anticoagulation. No evidence of active bleeding.   2. Orthostatic Hypotension - denies any recent symptoms.  - on Florinef 100 mcg daily.  3. SSS - s/p Medtronic PPM placement in 2012. - device interrogation in 08/2017 showed normal device function with rare episodes of PAT.   4. Parkinson's Disease - on Sinemet IR.    For questions or updates, please contact [place] Please consult [email or web address] for contact info under Cardiology/STEMI.   Arna Medici , PA-C 7:58 AM 10/03/2017 Pager: [phone]  Patient seen and discussed with PA Ahmed Prima, I agree with her documentation above. History of PAF, admitted with afib with RVR. Historically management of her PAF has been complicated by severe orthostatic hypotension related to her Parkinsons disease. Tried of low dose lopressor this admit 25mg  bid and episode of hypotension and presyncope. Yesterday we started amiodarone 200mg  bid. Rates remain uncontrolled, will load amidoarone IV today   Carlyle Dolly MD

## 2017-10-04 LAB — CBC
HCT: 43 % (ref 36.0–46.0)
HEMOGLOBIN: 13.6 g/dL (ref 12.0–15.0)
MCH: 28.4 pg (ref 26.0–34.0)
MCHC: 31.6 g/dL (ref 30.0–36.0)
MCV: 89.8 fL (ref 78.0–100.0)
Platelets: 199 10*3/uL (ref 150–400)
RBC: 4.79 MIL/uL (ref 3.87–5.11)
RDW: 14.4 % (ref 11.5–15.5)
WBC: 9.5 10*3/uL (ref 4.0–10.5)

## 2017-10-04 LAB — BASIC METABOLIC PANEL
ANION GAP: 9 (ref 5–15)
BUN: 12 mg/dL (ref 8–23)
CHLORIDE: 113 mmol/L — AB (ref 98–111)
CO2: 21 mmol/L — AB (ref 22–32)
Calcium: 8.5 mg/dL — ABNORMAL LOW (ref 8.9–10.3)
Creatinine, Ser: 0.9 mg/dL (ref 0.44–1.00)
GFR calc Af Amer: >60 mL/min (ref >60)
GFR, EST NON AFRICAN AMERICAN: 57 mL/min — AB (ref >60)
GLUCOSE: 100 mg/dL — AB (ref 70–99)
Potassium: 4.2 mmol/L (ref 3.5–5.1)
Sodium: 143 mmol/L (ref 135–145)

## 2017-10-04 MED ORDER — AMIODARONE HCL 200 MG PO TABS
200.0000 mg | ORAL_TABLET | Freq: Two times a day (BID) | ORAL | Status: DC
  Administered 2017-10-04: 200 mg via ORAL

## 2017-10-04 MED ORDER — CARBIDOPA-LEVODOPA 25-100 MG PO TABS: 1.0000 | ORAL_TABLET | Freq: Three times a day (TID) | ORAL | 2 refills | Status: DC

## 2017-10-04 MED ORDER — AMIODARONE HCL 200 MG PO TABS: 200.0000 mg | ORAL_TABLET | Freq: Two times a day (BID) | ORAL | 0 refills | Status: DC

## 2017-10-04 NOTE — Discharge Instructions (Signed)
1)Take amiodarone 200 mg tablets 1 tab twice a day for 3 weeks then decrease to 20 mg tablets once daily after 3 weeks 2)You are on Apixaban/Eliquis blood Thinner so Avoid ibuprofen/Advil/Aleve/Motrin/Goody Powders/Naproxen/BC powders/Meloxicam/Diclofenac/Indomethacin and other Nonsteroidal anti-inflammatory medications as these will make you more likely to bleed and can cause stomach ulcers, can also cause Kidney problems.  3) follow-up with a cardiologist for reevaluation in 2 to 3 weeks,  You will need a new prescription for your amiodarone at that time 4)Take all other Medications as previously prescribed and advised

## 2017-10-04 NOTE — Progress Notes (Addendum)
Progress Note  Patient Name: Abigail Taylor Date of Encounter: 10/04/2017  Primary Cardiologist: [name], MD   Subjective   She denies any chest pain, dyspnea, or palpitations overnight or this morning. No recurrent dizziness. Anxious to go home as she has been admitted since last Friday.   Inpatient Medications    Scheduled Meds: . apixaban  2.5 mg Oral BID  . carbidopa-levodopa  1 tablet Oral QHS  . carbidopa-levodopa  1 tablet Oral TID WC  . fludrocortisone  100 mcg Oral Daily  . levothyroxine  75 mcg Oral QAC breakfast  . rosuvastatin  10 mg Oral QHS  . sodium chloride flush  3 mL Intravenous Q12H   Continuous Infusions: . sodium chloride    . sodium chloride 75 mL/hr at 10/04/17 0600  . amiodarone 30 mg/hr (10/04/17 0600)   PRN Meds: sodium chloride, acetaminophen **OR** acetaminophen, ondansetron **OR** ondansetron (ZOFRAN) IV, senna-docusate, sodium chloride flush   Vital Signs    Vitals:   10/04/17 0400 10/04/17 0500 10/04/17 0600 10/04/17 0700  BP: (!) 142/77 (!) 152/72 (!) 149/73 (!) 151/72  Pulse: 67 60 63 65  Resp: 15   19  Temp: (!) 97.5 F (36.4 C)     TempSrc: Oral     SpO2: (!) 87% 91% 95% 98%  Weight: 61.6 kg     Height:        Intake/Output Summary (Last 24 hours) at 10/04/2017 0740 Last data filed at 10/04/2017 0600 Gross per 24 hour  Intake 2602.86 ml  Output -  Net 2602.86 ml   Filed Weights   09/29/17 1101 10/03/17 1026 10/04/17 0400  Weight: 55.3 kg 61.2 kg 61.6 kg    Telemetry    Converted from atrial fibrillation to NSR on 9/10 at 1300. Has maintained NSR since with HR in the 60's and occasional PAC's.  - Personally Reviewed  ECG    No new tracings.   Physical Exam   General: Well developed, well nourished elderly Caucasian female appearing in no acute distress. Head: Normocephalic, atraumatic.  Neck: Supple without bruits, JVD not elevated. Lungs:  Resp regular and unlabored, CTA without wheezing or  rales. Heart: RRR, S1, S2, no S3, S4, or murmur; no rub. Abdomen: Soft, non-tender, non-distended with normoactive bowel sounds. No hepatomegaly. No rebound/guarding. No obvious abdominal masses. Extremities: No clubbing or cyanosis. Trace ankle edema bilaterally. Distal pedal pulses are 2+ bilaterally. Neuro: Alert and oriented X 3. Moves all extremities spontaneously. Psych: Normal affect.  Labs    Chemistry Recent Labs  Lab 09/30/17 0610 10/02/17 0456 10/03/17 1027 10/04/17 0423  NA 144 142  --  143  K 4.2 4.3  --  4.2  CL 110 110  --  113*  CO2 23 24  --  21*  GLUCOSE 98 98  --  100*  BUN 17 17  --  12  CREATININE 0.84 0.85  --  0.90  CALCIUM 8.9 8.5*  --  8.5*  PROT  --   --  5.7*  --   ALBUMIN  --   --  3.5  --   AST  --   --  22  --   ALT  --   --  8  --   ALKPHOS  --   --  76  --   BILITOT  --   --  0.5  --   GFRNONAA >60 >60  --  57*  GFRAA >60 >60  --  >60  ANIONGAP  11 8  --  9     Hematology Recent Labs  Lab 09/29/17 1115 09/30/17 0610 10/04/17 0423  WBC 9.6 8.2 9.5  RBC 5.00 4.90 4.79  HGB 14.2 13.9 13.6  HCT 44.6 43.8 43.0  MCV 89.2 89.4 89.8  MCH 28.4 28.4 28.4  MCHC 31.8 31.7 31.6  RDW 13.9 14.1 14.4  PLT 208 207 199    Cardiac Enzymes Recent Labs  Lab 09/29/17 1115 09/29/17 1826 09/30/17 0006 09/30/17 0610  TROPONINI 0.06* 0.07* 0.06* 0.06*   No results for input(s): TROPIPOC in the last 168 hours.   BNP Recent Labs  Lab 09/29/17 1115  BNP 692.0*     DDimer No results for input(s): DDIMER in the last 168 hours.   Radiology    No results found.  Cardiac Studies   Echocardiogram: 09/2017 Study Conclusions  - Left ventricle: The cavity size was normal. Wall thickness was   normal. Systolic function was normal. The estimated ejection   fraction was in the range of 50% to 55%. Wall motion was normal;   there were no regional wall motion abnormalities. - Aortic valve: There was mild regurgitation. - Mitral valve: There  was mild regurgitation. - Left atrium: The atrium was moderately dilated. - Right atrium: The atrium was mildly dilated.  Impressions:  - Normal LV systolic function; mild AI; mild MR; biatrial   enlargement; mild TR.  Patient Profile     84 y.o. female w/ PMH of paroxysmal atrial fibrillation (on Eliquis for anticoagulation), sinus node dysfunction (s/p Medtronic PPM placement in 2012), orthostatic hypotension, Parkinson's Disease, and HLD who presented to [facility] ED on 09/29/2017 for evaluation of palpitations and weakness, found to be in atrial fibrillation with RVR.   Assessment & Plan    1. Atrial Fibrillation with RVR - the patient presented with worsening weakness and palpitations, found to be in atrial fibrillation with RVR. AV nodal blocking agents were previously avoided given history of hypotension and presyncope (even occurring with low-dose Lopressor this admission), therefore she has been initiated on Amiodarone. Started on PO loading initially due to hypotension in the past but rates remained poorly controlled, therefore IV Amiodarone was initiated on 9/10 and she converted to NSR that afternoon. Has maintained NSR since with HR in the 60's. She is very anxious to go home later today if possible. Can likely be switched back to PO Amiodarone 200mg  BID today (tolerated this well initially with no associated hypotension) and continue on this dosing for 3-4 weeks, then reduce to 200mg  daily. TSH and LFT's WNL this admission.  - remains on Eliquis 2.5mg  BID for anticoagulation. No evidence of active bleeding.   2. Orthostatic Hypotension - recorded BP has been variable at 83/52 - 152/100 within the past 24 hours. At 151/72 on most recent check. Still receiving IVF at this time and she does have some lower extremity edema on examination. Will stop IVF and follow BP.  - on Florinef 100 mcg daily.  3. SSS - s/p Medtronic PPM placement in 2012. Most recent interrogation in 08/2017  showed normal device function with rare episodes of PAT.   4. Parkinson's Disease - on Sinemet IR prior to admission.     For questions or updates, please contact [place] Please consult [email or web address] for contact info under Cardiology/STEMI.   Arna Medici , PA-C 7:40 AM 10/04/2017 Pager: [phone]   Attending note Patient seen and discussed with PA Ahmed Prima, I agree with her documentation above. History  of PAF, management has been complicated by orthostatic hypotension. Has not tolerated av nodal agents including brief trial of lopressor during this admission that led to significnat hypotension and presyncope. Started on amio gtt yesterday, converted to SR with wandering atrial pacemaker this morning. She will complete her 24 hour load of amio around 1030 this morning, would then change to 200mg  bid x 4 weeks, then 200mg  daily. Continue eliquis for stroke prevention. If heart rates remain stable would be ok for discharge later today.   Carlyle Dolly MD

## 2017-10-04 NOTE — Discharge Summary (Signed)
Abigail Taylor, is a 84 y.o. female  DOB 12-Aug-1931  MRN [id number].  Admission date:  09/29/2017  Admitting Physician  Erline Hau, MD  Discharge Date:  10/04/2017   Primary MD  [name], MD  Recommendations for primary care physician for things to follow:   1)Take amiodarone 200 mg tablets 1 tab twice a day for 3 weeks then decrease to 20 mg tablets once daily after 3 weeks 2)You are on Apixaban/Eliquis blood Thinner so Avoid ibuprofen/Advil/Aleve/Motrin/Goody Powders/Naproxen/BC powders/Meloxicam/Diclofenac/Indomethacin and other Nonsteroidal anti-inflammatory medications as these will make you more likely to bleed and can cause stomach ulcers, can also cause Kidney problems.  3) follow-up with a cardiologist for reevaluation in 2 to 3 weeks,  You will need a new prescription for your amiodarone at that time 4)Take all other Medications as previously prescribed and advised   Admission Diagnosis  Elevated troponin [R74.8] Atrial fibrillation with rapid ventricular response ([place]) [I48.91] Chest pain in adult [R07.9]   Discharge Diagnosis  Elevated troponin [R74.8] Atrial fibrillation with rapid ventricular response ([place]) [I48.91] Chest pain in adult [R07.9]    Principal Problem:   Atrial fibrillation with RVR ([place]) Active Problems:   Hypothyroidism   Sinus pause, 7 seconds post conversion, S/P MDT pacemaker 07/05/11   Dyslipidemia   HTN (hypertension)   PD (Parkinson's disease) ([place])   Tachy-brady syndrome ([facility])   Orthostatic hypotension due to Parkinson's disease [facility])      Past Medical History:  Diagnosis Date  . Angina at rest, with the tachycardia 07/05/2011  . Aortic insufficiency 07/05/2011  . Atrial fibrillation ([place])   . Brady-tachy syndrome ([place])    Dual chamber Medtronic pacemaker Adapta  . Chronic back pain   . Constipation 12/27/2011  . Gait instability   .  Hyperlipidemia   . Hypothyroidism   . Normal cardiac stress test 2013  . OA (osteoarthritis)   . Pacemaker   . Parkinson's disease ([place])   . Tremor     Past Surgical History:  Procedure Laterality Date  . ABDOMINAL HYSTERECTOMY    . CARDIAC CATHETERIZATION  05/26/1999   normal  . CARDIAC CATHETERIZATION  2001   normal coronary arteries  . cataract    . COLONOSCOPY N/A 11/15/2012   Procedure: COLONOSCOPY;  Surgeon: [name], MD;  [facility];  Service: Endoscopy;  Laterality: N/A;  1030  . HIP ARTHROPLASTY Left 06/24/2016   Procedure: ARTHROPLASTY BIPOLAR HIP (HEMIARTHROPLASTY);  Surgeon: Altamese Colfax, MD;  Location: [place];  Service: Orthopedics;  Laterality: Left;  . INSERT / REPLACE / REMOVE PACEMAKER    . NM MYOVIEW LTD  07/19/2011   normal  . PERMANENT PACEMAKER INSERTION  07/05/2011   Medtronic Adapta dual chamber  . PERMANENT PACEMAKER INSERTION N/A 07/05/2011   Procedure: PERMANENT PACEMAKER INSERTION;  Surgeon: [name], MD;  Location: [place] CATH LAB;  Service: Cardiovascular;  Laterality: N/A;       HPI  from the history and physical done on the day of admission:  Chief Complaint: Palpitations  HPI: Abigail Taylor is a 84 y.o. female with history of atrial fibrillation chronically anticoagulated on Eliquis but not on rate controlling medications (unclear as of reasoning but she does have a history of orthostatic hypotension which I suspect may be why), tachybradycardia syndrome status post pacemaker placement, hyperlipidemia, hypothyroidism who comes to the hospital today with complaint of palpitations.  She states she has been feeling really unwell for about 8 weeks, today she felt her chest beating really fast and thought she may pass out which she never did.  She came to the hospital for evaluation.  EKG on admission showed A. fib with a heart rate of around 1 40-1 50.  Request was made for admission.  Vital signs are otherwise stable, labs are  unremarkable.     Hospital Course:   Principal Problem:   Atrial fibrillation with RVR (H[facility]) Active Problems:   Hypothyroidism   Sinus pause, 7 seconds post conversion, S/P MDT pacemaker 07/05/11   Dyslipidemia   HTN (hypertension)   PD (Parkinson's disease) ([place])   Tachy-brady syndrome (HCC)   Orthostatic hypotension due to Parkinson's disease [facility])     Brief summary 84 y.o. female w/ PMH of paroxysmal atrial fibrillation(on Eliquis for anticoagulation), sinus node dysfunction (s/p Medtronic PPM placement in 2012), orthostatic hypotension, Parkinson's Disease, and HLD who was admitted on 09/29/2017 due to palpitations with heart rate in the 140 to 150 range who was noted to be in A. fib with rapid ventricular rate  PlaN:- 1)Atrial fibrillation with rapid ventricular response- - on admission heart rate was 140 to 1 50 range, PTA she was not on any AV nodal agent due to prior history of hypotension, presyncope even with low-dose medications, cardiology recommended amiodarone, patient was started on IV amiodarone on 10/03/2017, she converted to sinus rhythm , Cardiology advised to discharge home on amiodarone 200 mg tablets 1 tab twice a day for 3 weeks then decrease to 20 mg tablets once daily after 3 weeks Avoid Cardizem and metoprolol due to propensity towards hypotension/presyncopal events  2)H/o Parkinson's disease--- with a tendency towards orthostatic hypotension, continue Sinemet, continue Florinef . 3)Tachybradycardia syndrome/sick sinus syndrome-  s/pMedtronic PPM placement in 2012. Most recent interrogation in 08/2017 showed normal device function with rare episodes of PAT.  avoid calcium channel blockers and beta-blockers for reasons noted above  4)Hypothyroidism --TSH is within normal limits at 0.799, continue home dose of Synthroid.   DVT prophylaxis: Fully anticoagulated on Eliquis Code Status: Full code Family Communication: Patient only  Disposition Plan: Pending   improvement in heart rates.  Consultants:   Cardiology  Procedures:   2D echo: Showed ejection fraction of 50 to 55%, normal wall motion  Discharge Condition: stable  Follow UP  Follow-up Information    Barrett, Evelene Croon, PA-C Follow up on 10/24/2017.   Specialties:  Cardiology, Radiology Why:  Cardiology Hospital Follow-Up on 10/24/2017 at 10:30 AM with Rosaria Ferries, PA-C (works with Dr. Sallyanne Kuster).  Contact information: [address] [place] [address] [phone]          Diet and Activity recommendation:  As advised  Discharge Instructions    Discharge Instructions    (HEART FAILURE PATIENTS) Call MD:  Anytime you have any of the following symptoms: 1) 3 pound weight gain in 24 hours or 5 pounds in 1 week 2) shortness of breath, with or without a dry hacking cough 3) swelling in the hands, feet or stomach 4) if you have  to sleep on extra pillows at night in order to breathe.   Complete by:  As directed    Call MD for:  difficulty breathing, headache or visual disturbances   Complete by:  As directed    Call MD for:  persistant dizziness or light-headedness   Complete by:  As directed    Call MD for:  persistant nausea and vomiting   Complete by:  As directed    Call MD for:  severe uncontrolled pain   Complete by:  As directed    Call MD for:  temperature >100.4   Complete by:  As directed    Diet - low sodium heart healthy   Complete by:  As directed    Discharge instructions   Complete by:  As directed    1)Take amiodarone 200 mg tablets 1 tab twice a day for 3 weeks then decrease to 20 mg tablets once daily after 3 weeks 2)You are on Apixaban/Eliquis blood Thinner so Avoid ibuprofen/Advil/Aleve/Motrin/Goody Powders/Naproxen/BC powders/Meloxicam/Diclofenac/Indomethacin and other Nonsteroidal anti-inflammatory medications as these will make you more likely to bleed and can cause stomach ulcers, can also cause Kidney problems.  3) follow-up with  a cardiologist for reevaluation in 2 to 3 weeks,  You will need a new prescription for your amiodarone at that time 4)Take all other Medications as previously prescribed and advised   Increase activity slowly   Complete by:  As directed         Discharge Medications     Allergies as of 10/04/2017   No Known Allergies     Medication List    TAKE these medications   acetaminophen 325 MG tablet Commonly known as:  TYLENOL Take 2 tablets (650 mg total) by mouth every 6 (six) hours as needed for mild pain (or Fever >/= 101).   amiodarone 200 MG tablet Commonly known as:  PACERONE Take 1 tablet (200 mg total) by mouth 2 (two) times daily. For 3 weeks, then 200 mg  once daily after that   carbidopa-levodopa 50-200 MG tablet Commonly known as:  SINEMET CR TAKE 1 TABLET BY MOUTH AT BEDTIME What changed:  Another medication with the same name was changed. Make sure you understand how and when to take each.   carbidopa-levodopa 25-100 MG tablet Commonly known as:  SINEMET IR Take 1 tablet by mouth 3 (three) times daily with meals. What changed:  when to take this   ELIQUIS 2.5 MG Tabs tablet Generic drug:  apixaban TAKE 1 TABLET BY MOUTH TWICE A DAY   fludrocortisone 0.1 MG tablet Commonly known as:  FLORINEF Take 1 tablet (100 mcg total) by mouth daily.   levothyroxine 75 MCG tablet Commonly known as:  SYNTHROID, LEVOTHROID Take 75 mcg by mouth daily before breakfast.   rosuvastatin 10 MG tablet Commonly known as:  CRESTOR Take 10 mg by mouth at bedtime. Reported on 07/09/2015   senna-docusate 8.6-50 MG tablet Commonly known as:  Senokot-S Take 1 tablet by mouth at bedtime as needed for mild constipation.   XIIDRA 5 % Soln Generic drug:  Lifitegrast      Major procedures and Radiology Reports - PLEASE review detailed and final reports for all details, in brief -    Dg Chest Port 1 View  Result Date: 09/29/2017 CLINICAL DATA:  Hervey Ard mid chest pain that shoots into  back/a fib/hx heart cath/pacemaker insertion EXAM: PORTABLE CHEST 1 VIEW COMPARISON:  04/18/2016 FINDINGS: Cardiac silhouette is normal in size. No mediastinal or hilar masses.  No evidence of adenopathy. Lungs are hyperexpanded, but clear. No convincing pleural effusion. No pneumothorax. Left anterior chest wall sequential pacemaker is stable. Skeletal structures are grossly intact. IMPRESSION: No active disease. Electronically Signed   By: Lajean Manes M.D.   On: 09/29/2017 12:23    Micro Results    Recent Results (from the past 240 hour(s))  MRSA PCR Screening     Status: None   Collection Time: 10/03/17 11:52 AM  Result Value Ref Range Status   MRSA by PCR NEGATIVE NEGATIVE Final    Comment:        The GeneXpert MRSA Assay (FDA approved for NASAL specimens only), is one component of a comprehensive MRSA colonization surveillance program. It is not intended to diagnose MRSA infection nor to guide or monitor treatment for MRSA infections. Performed at [facility], [address], [place], [place] [address]    Today   Subjective    Daysia Vandenboom today has no new concerns, No fever  Or chills , no cp, no sob No Nausea, Vomiting or Diarrhea      Patient has been seen and examined prior to discharge   Objective   Blood pressure 140/70, pulse 60, temperature 97.6 F (36.4 C), temperature source Oral, resp. rate 19, height 5\' 6"  (1.676 m), weight 61.6 kg, SpO2 98 %.   Intake/Output Summary (Last 24 hours) at 10/04/2017 1643 Last data filed at 10/04/2017 1035 Gross per 24 hour  Intake 1604.87 ml  Output -  Net 1604.87 ml    Exam Gen:- Awake Alert,  In no apparent distress  HEENT:- [place].AT, No sclera icterus Neck-Supple Neck,No JVD,.  Lungs-  CTAB , good air movement bilaterally CV- S1, S2 normal, irregularly irregular .heart rate around 60 Abd-  +ve B.Sounds, Abd Soft, No tenderness,    Extremity/Skin:- No LE edema,   good pulses Psych-affect is appropriate, oriented  x3 Neuro-no new focal deficits, history of parkinsonian tremors   Data Review   CBC w Diff:  Lab Results  Component Value Date   WBC 9.5 10/04/2017   HGB 13.6 10/04/2017   HCT 43.0 10/04/2017   PLT 199 10/04/2017   LYMPHOPCT 20 07/15/2016   MONOPCT 6 07/15/2016   EOSPCT 4 07/15/2016   BASOPCT 0 07/15/2016    CMP:  Lab Results  Component Value Date   NA 143 10/04/2017   K 4.2 10/04/2017   CL 113 (H) 10/04/2017   CO2 21 (L) 10/04/2017   BUN 12 10/04/2017   CREATININE 0.90 10/04/2017   CREATININE 0.74 04/30/2015   PROT 5.7 (L) 10/03/2017   ALBUMIN 3.5 10/03/2017   BILITOT 0.5 10/03/2017   ALKPHOS 76 10/03/2017   AST 22 10/03/2017   ALT 8 10/03/2017    Total Discharge time is about 33 minutes  Roxan Hockey M.D on 10/04/2017 at 4:43 PM  Pager---[phone]  Go to [email or web address] - password [email or web address] for contact info  [facility] - Office  [phone]

## 2017-10-05 ENCOUNTER — Telehealth (HOSPITAL_COMMUNITY): Payer: Self-pay | Admitting: Internal Medicine

## 2017-10-05 ENCOUNTER — Ambulatory Visit (HOSPITAL_COMMUNITY): Payer: Medicare Other

## 2017-10-05 ENCOUNTER — Telehealth: Payer: Self-pay | Admitting: Cardiovascular Disease

## 2017-10-05 NOTE — Telephone Encounter (Signed)
Returned call to patient of Dr C who was discharged yesterday 9/12. She reports she had bilateral ankle swelling/feet swelling in the hospital. She reports her swelling is still bad today. Amiodarone is a new medication for patient. Patient states she has not been gaining weight.   Advised will send to pharmacy for med review for potential side effects and to MD to review and advise

## 2017-10-05 NOTE — Telephone Encounter (Signed)
10/05/17  pt left a message that she has been in the hospital for the last 6 days and got out last night.  she said she was not going to be able to do therapy for awhile

## 2017-10-05 NOTE — Telephone Encounter (Signed)
New Message   Pt c/o medication issue:  1. Name of Medication: amiodarone (PACERONE) 200 MG tablet  2. How are you currently taking this medication (dosage and times per day)? Take 1 tablet (200 mg total) by mouth 2 (two) times daily. For 3 weeks, then 200 mg once daily after that 3. Are you having a reaction (difficulty breathing--STAT)? Feet swelling   4. What is your medication issue? Patient states that she just came home from the hospital and started this medication. Now her feet are swollen to the point they look like they are going to burst.

## 2017-10-05 NOTE — Telephone Encounter (Signed)
10/05/17  I called and talked to Abigail Taylor.  She had left [place] a message saying that she just got out of the hospital last night after being in for 6 days.  She said it was due to her heart.  I asked if she would be receiving any home therapy and she said no she had her husband there that would be helping her.  I told her that we could either put on hold or discharge her and when she was ready to come back to therapy to call and we would get a new referral for her to resume where she left off.

## 2017-10-06 NOTE — Telephone Encounter (Signed)
The swelling is from the IV fluids administered in the hospital. Please take the fludrocortisone (fluorinef) only EVERY OTHER day for a week. The swelling should gradually improve. When gone, can go back to daily fludrocortisone.

## 2017-10-06 NOTE — Telephone Encounter (Signed)
Patient advised of MD recommendations.

## 2017-10-06 NOTE — Telephone Encounter (Signed)
Amiodarone may cause some edema but noted patient had LE edema on admission to the hospital.  Patient will need re-assessment by cardiologist prior to changing therapy.   Recommendations:  1. Keep leg elevated when possible  2. Continue probe hydration  3. Keep OV for re-assessment

## 2017-10-06 NOTE — Telephone Encounter (Signed)
Patient called with [facility] recommendations. She reports her legs are still swelling today. No APP appointments available for next week (except 24 hour acute slots). Suggested patient contact PCP for an appointment to have her leg evaluated in the interim.

## 2017-10-09 ENCOUNTER — Encounter (HOSPITAL_COMMUNITY): Payer: Medicare Other | Admitting: Speech Pathology

## 2017-10-09 ENCOUNTER — Ambulatory Visit (HOSPITAL_COMMUNITY): Payer: Medicare Other | Admitting: Physical Therapy

## 2017-10-10 DIAGNOSIS — G20 Parkinson's disease: Secondary | ICD-10-CM | POA: Diagnosis not present

## 2017-10-10 DIAGNOSIS — Z6821 Body mass index (BMI) 21.0-21.9, adult: Secondary | ICD-10-CM | POA: Diagnosis not present

## 2017-10-10 DIAGNOSIS — I4891 Unspecified atrial fibrillation: Secondary | ICD-10-CM | POA: Diagnosis not present

## 2017-10-10 DIAGNOSIS — Z23 Encounter for immunization: Secondary | ICD-10-CM | POA: Diagnosis not present

## 2017-10-10 DIAGNOSIS — Z950 Presence of cardiac pacemaker: Secondary | ICD-10-CM | POA: Diagnosis not present

## 2017-10-17 ENCOUNTER — Encounter (HOSPITAL_COMMUNITY): Payer: Medicare Other | Admitting: Speech Pathology

## 2017-10-17 ENCOUNTER — Ambulatory Visit (HOSPITAL_COMMUNITY): Payer: Medicare Other

## 2017-10-18 ENCOUNTER — Other Ambulatory Visit: Payer: Self-pay | Admitting: Cardiovascular Disease

## 2017-10-24 ENCOUNTER — Ambulatory Visit (HOSPITAL_COMMUNITY): Payer: Medicare Other

## 2017-10-24 ENCOUNTER — Encounter (HOSPITAL_COMMUNITY): Payer: Medicare Other | Admitting: Speech Pathology

## 2017-10-24 ENCOUNTER — Encounter: Payer: Self-pay | Admitting: Physician Assistant

## 2017-10-24 ENCOUNTER — Ambulatory Visit (INDEPENDENT_AMBULATORY_CARE_PROVIDER_SITE_OTHER): Payer: Medicare Other | Admitting: Physician Assistant

## 2017-10-24 VITALS — BP 138/80 | HR 65 | Ht 66.0 in | Wt 120.8 lb

## 2017-10-24 DIAGNOSIS — G903 Multi-system degeneration of the autonomic nervous system: Secondary | ICD-10-CM

## 2017-10-24 DIAGNOSIS — Z7901 Long term (current) use of anticoagulants: Secondary | ICD-10-CM | POA: Diagnosis not present

## 2017-10-24 DIAGNOSIS — I495 Sick sinus syndrome: Secondary | ICD-10-CM

## 2017-10-24 DIAGNOSIS — E039 Hypothyroidism, unspecified: Secondary | ICD-10-CM

## 2017-10-24 DIAGNOSIS — I480 Paroxysmal atrial fibrillation: Secondary | ICD-10-CM | POA: Diagnosis not present

## 2017-10-24 MED ORDER — AMIODARONE HCL 200 MG PO TABS: ORAL_TABLET | ORAL | 3 refills | Status: DC

## 2017-10-24 NOTE — Progress Notes (Signed)
Thank you :)

## 2017-10-24 NOTE — Patient Instructions (Addendum)
Keep appointment for pacemaker check 11/02/17 at 9:15 am   Take Amiodarone 200 mg daily with largest meal    Call office if nausea does not improve    Your physician recommends that you schedule a follow-up appointment with Dr.Croitoru  Thursday 02/01/18 at 11: am   [place] appt with Dr.Croitoru  Thursday 02/01/18 at 11:20 am

## 2017-10-24 NOTE — Progress Notes (Signed)
Cardiology Office Note   Date:  10/24/2017   ID:  Abigail Taylor, DOB [birth year], MRN [id number]  PCP:  [name], MD  Cardiologist:  [name], MD  Rosaria Ferries, PA-C   No chief complaint on file.   History of Present Illness: Abigail Taylor is a 84 y.o. female with a history of PAF on Eliquis, MDT PPM 2012 for sinus pauses 7 sec, RBBB, orthostatic hypotension, HLD, Parkinson's dz, hypothyroid, OA  Admitted 09/06-09/11/2017 for Afib, on amio and Eliquis, trop up to 0.06.   Abigail Taylor presents for cardiology follow up.   She has been waking some mornings with nausea since discharge.   She does not have much appetite. Lunch is probably largest meal.    No palpitations since d/c. Does not think she has had Afib.  Had some LE edema at d/c, that has improved.   Not very active, does a little around the house.  No reports of chest pain or shortness of breath with activity.  Had some CP when her HR was elevated, none since.    Past Medical History:  Diagnosis Date  . Angina at rest, with the tachycardia 07/05/2011  . Aortic insufficiency 07/05/2011  . Atrial fibrillation ([place])   . Brady-tachy syndrome ([place])    Dual chamber Medtronic pacemaker Adapta  . Chronic back pain   . Constipation 12/27/2011  . Gait instability   . Hyperlipidemia   . Hypothyroidism   . Normal cardiac stress test 2013  . OA (osteoarthritis)   . Pacemaker   . Parkinson's disease ([place])   . Tremor     Past Surgical History:  Procedure Laterality Date  . ABDOMINAL HYSTERECTOMY    . CARDIAC CATHETERIZATION  05/26/1999   normal  . CARDIAC CATHETERIZATION  2001   normal coronary arteries  . cataract    . COLONOSCOPY N/A 11/15/2012   Procedure: COLONOSCOPY;  Surgeon: [name], MD;  [facility];  Service: Endoscopy;  Laterality: N/A;  1030  . HIP ARTHROPLASTY Left 06/24/2016   Procedure: ARTHROPLASTY BIPOLAR HIP (HEMIARTHROPLASTY);  Surgeon: Altamese Shell Valley, MD;   Location: [facility];  Service: Orthopedics;  Laterality: Left;  . INSERT / REPLACE / REMOVE PACEMAKER    . NM MYOVIEW LTD  07/19/2011   normal  . PERMANENT PACEMAKER INSERTION  07/05/2011   Medtronic Adapta dual chamber  . PERMANENT PACEMAKER INSERTION N/A 07/05/2011   Procedure: PERMANENT PACEMAKER INSERTION;  Surgeon: [name], MD;  Location: [facility];  Service: Cardiovascular;  Laterality: N/A;    Current Outpatient Medications  Medication Sig Dispense Refill  . amiodarone (PACERONE) 200 MG tablet Take 1 tablet (200 mg total) by mouth 2 (two) times daily. For 3 weeks, then 200 mg  once daily after that 60 tablet 0  . carbidopa-levodopa (SINEMET CR) 50-200 MG tablet TAKE 1 TABLET BY MOUTH AT BEDTIME 90 tablet 1  . carbidopa-levodopa (SINEMET IR) 25-100 MG tablet Take 1 tablet by mouth 3 (three) times daily with meals. 90 tablet 2  . ELIQUIS 2.5 MG TABS tablet TAKE 1 TABLET BY MOUTH TWICE A DAY 180 tablet 0  . fludrocortisone (FLORINEF) 0.1 MG tablet Take 1 tablet (100 mcg total) by mouth daily. 90 tablet 3  . levothyroxine (SYNTHROID, LEVOTHROID) 75 MCG tablet Take 75 mcg by mouth daily before breakfast.    . rosuvastatin (CRESTOR) 10 MG tablet Take 10 mg by mouth at bedtime. Reported on 07/09/2015    . senna-docusate (SENOKOT-S)  8.6-50 MG tablet Take 1 tablet by mouth at bedtime as needed for mild constipation.    Marland Kitchen XIIDRA 5 % SOLN      No current facility-administered medications for this visit.     Allergies:   Patient has no known allergies.    Social History:  The patient  reports that she has never smoked. She has never used smokeless tobacco. She reports that she does not drink alcohol or use drugs.   Family History:  The patient's family history includes Cancer in her father; Colon cancer (age of onset: 28) in her mother; Heart attack in her mother; Suicidality in her son.    ROS:  Please see the history of present illness. All other systems are reviewed and negative.     PHYSICAL EXAM: VS:  BP 138/80   Pulse 65   Ht 5\' 6"  (1.676 m)   Wt 120 lb 12.8 oz (54.8 kg)   BMI 19.50 kg/m  , BMI Body mass index is 19.5 kg/m. GEN: Well nourished, well developed, female in no acute distress  HEENT: normal for age  Neck: no JVD, no carotid bruit, no masses Cardiac: RRR; SEM and short DM, no rubs, or gallops Respiratory:  clear to auscultation bilaterally, normal work of breathing GI: soft, nontender, nondistended, + BS MS: no deformity or atrophy; no edema; distal pulses are 2+ in all 4 extremities   Skin: warm and dry, no rash Neuro:  Strength and sensation are intact Psych: euthymic mood, full affect   EKG:  EKG is ordered today. The ekg ordered today demonstrates atrial pacing with right bundle, heart rate 65  Echocardiogram: 09/2017 Study Conclusions  - Left ventricle: The cavity size was normal. Wall thickness was normal. Systolic function was normal. The estimated ejection fraction was in the range of 50% to 55%. Wall motion was normal; there were no regional wall motion abnormalities. - Aortic valve: There was mild regurgitation. - Mitral valve: There was mild regurgitation. - Left atrium: The atrium was moderately dilated. - Right atrium: The atrium was mildly dilated.  Impressions:  - Normal LV systolic function; mild AI; mild MR; biatrial enlargement; mild TR.   Recent Labs: 09/29/2017: B Natriuretic Peptide 692.0; TSH 0.799 10/03/2017: ALT 8 10/04/2017: BUN 12; Creatinine, Ser 0.90; Hemoglobin 13.6; Platelets 199; Potassium 4.2; Sodium 143    Lipid Panel    Component Value Date/Time   CHOL 133 04/30/2015 0853   TRIG 84 04/30/2015 0853   HDL 54 04/30/2015 0853   CHOLHDL 2.5 04/30/2015 0853   VLDL 17 04/30/2015 0853   LDLCALC 62 04/30/2015 0853     Wt Readings from Last 3 Encounters:  10/24/17 120 lb 12.8 oz (54.8 kg)  10/04/17 135 lb 12.9 oz (61.6 kg)  09/06/17 122 lb (55.3 kg)     Other studies  Reviewed: Additional studies/ records that were reviewed today include: Office notes, hospital records and testing.  ASSESSMENT AND PLAN:  1.  Paroxysmal atrial fibrillation: She is maintaining sinus rhythm on amiodarone.  Decrease the dose to 200 mg daily.  Take the amiodarone with her largest meal of the day which is lunch.  Her TSH was checked while she was in the hospital and was normal.  Hepatic function panel was checked as well and was normal. -If she remains on the amiodarone long-term, check LFTs and make sure she gets a yearly eye exam.  2.  Hypothyroidism: Her TSH was checked while she was in the hospital and was normal.  Continue current dose of Synthroid, per IM  3.  Chronic anticoagulation: She has not missed any doses of her Eliquis.  No bleeding issues. CHA2DS2-VASc = 3 (age x 2, female)  4.  Orthostatic hypotension: She is on Florinef for this and tolerating the medication well.  Because of this, she is not on beta-blocker or calcium channel blocker.  5.  Sick sinus syndrome: Her pacemaker is functioning well and she is aware of an upcoming remote check.  She is compliant with these and is encouraged to continue this.  Current medicines are reviewed at length with the patient today.  The patient has concerns regarding medicines.  Concerns were addressed  The following changes have been made: Decrease the amiodarone to 200 mg daily with lunch  Labs/ tests ordered today include:  No orders of the defined types were placed in this encounter.    Disposition:   FU with Dr Sallyanne Kuster  Signed, Rosaria Ferries, PA-C  10/24/2017 10:48 AM    [place] Phone: [phone]; Fax: [phone]  This note was written with the assistance of speech recognition software. Please excuse any transcriptional errors.

## 2017-10-25 DIAGNOSIS — I739 Peripheral vascular disease, unspecified: Secondary | ICD-10-CM | POA: Diagnosis not present

## 2017-10-30 ENCOUNTER — Other Ambulatory Visit: Payer: Self-pay | Admitting: Cardiovascular Disease

## 2017-10-30 DIAGNOSIS — H04123 Dry eye syndrome of bilateral lacrimal glands: Secondary | ICD-10-CM | POA: Diagnosis not present

## 2017-10-31 ENCOUNTER — Ambulatory Visit (HOSPITAL_COMMUNITY): Payer: Medicare Other | Attending: Neurology | Admitting: Speech Pathology

## 2017-10-31 ENCOUNTER — Encounter (HOSPITAL_COMMUNITY): Payer: Self-pay | Admitting: Speech Pathology

## 2017-10-31 DIAGNOSIS — R2681 Unsteadiness on feet: Secondary | ICD-10-CM | POA: Insufficient documentation

## 2017-10-31 DIAGNOSIS — R2689 Other abnormalities of gait and mobility: Secondary | ICD-10-CM | POA: Diagnosis not present

## 2017-10-31 DIAGNOSIS — R471 Dysarthria and anarthria: Secondary | ICD-10-CM | POA: Diagnosis not present

## 2017-10-31 DIAGNOSIS — R293 Abnormal posture: Secondary | ICD-10-CM | POA: Insufficient documentation

## 2017-10-31 DIAGNOSIS — M6281 Muscle weakness (generalized): Secondary | ICD-10-CM | POA: Diagnosis not present

## 2017-10-31 NOTE — Therapy (Signed)
[place], [place], [address] Phone: [phone]   Fax:  [phone]  Speech Language Pathology Treatment  Patient Details  Name: Abigail Taylor MRN: [id number] Date of Birth: 10-Feb-1931 Referring Provider (SLP): Alonza Bogus, DO   Encounter Date: 10/31/2017  End of Session - 10/31/17 1833    Visit Number  2    Number of Visits  5    Date for SLP Re-Evaluation  11/28/17    Authorization Type  Medicare and Generic Commercial, no auth based on medical necessity    SLP Start Time  1430    SLP Stop Time   1515    SLP Time Calculation (min)  45 min    Activity Tolerance  Patient tolerated treatment well       Past Medical History:  Diagnosis Date  . Angina at rest, with the tachycardia 07/05/2011  . Aortic insufficiency 07/05/2011  . Atrial fibrillation ([place])   . Brady-tachy syndrome ([place])    Dual chamber Medtronic pacemaker Adapta  . Chronic back pain   . Constipation 12/27/2011  . Gait instability   . Hyperlipidemia   . Hypothyroidism   . Normal cardiac stress test 2013  . OA (osteoarthritis)   . Pacemaker   . Parkinson's disease ([place])   . Tremor     Past Surgical History:  Procedure Laterality Date  . ABDOMINAL HYSTERECTOMY    . CARDIAC CATHETERIZATION  05/26/1999   normal  . CARDIAC CATHETERIZATION  2001   normal coronary arteries  . cataract    . COLONOSCOPY N/A 11/15/2012   Procedure: COLONOSCOPY;  Surgeon: [name], MD;  [facility];  Service: Endoscopy;  Laterality: N/A;  1030  . HIP ARTHROPLASTY Left 06/24/2016   Procedure: ARTHROPLASTY BIPOLAR HIP (HEMIARTHROPLASTY);  Surgeon: Altamese Whitewater, MD;  Location: [facility];  Service: Orthopedics;  Laterality: Left;  . INSERT / REPLACE / REMOVE PACEMAKER    . NM MYOVIEW LTD  07/19/2011   normal  . PERMANENT PACEMAKER INSERTION  07/05/2011   Medtronic Adapta dual chamber  . PERMANENT PACEMAKER INSERTION N/A 07/05/2011   Procedure:  PERMANENT PACEMAKER INSERTION;  Surgeon: [name], MD;  Location: [facility];  Service: Cardiovascular;  Laterality: N/A;    There were no vitals filed for this visit.  Subjective Assessment - 10/31/17 1453    Subjective  "My balance is terrible now."    Currently in Pain?  No/denies         ADULT SLP TREATMENT - 10/31/17 0001      General Information   Behavior/Cognition  Alert;Pleasant mood;Cooperative    Patient Positioning  Upright in chair    Oral care provided  N/A    HPI  Abigail Taylor is an 84 yo woman who is known to SLP outpatient service from previous therapy (January 2019). She is referred for SLP therapy by Dr. Wells Guiles Tat, her movement disorders neurologist.       Treatment Provided   Treatment provided  Cognitive-Linquistic      Pain Assessment   Pain Assessment  No/denies pain      Cognitive-Linquistic Treatment   Treatment focused on  Dysarthria;Voice;Patient/family/caregiver education    Skilled Treatment  Pt not seen for over a month due to recent hospitalization, however her speech/voice is at same level as evaluation on 09/19/17. Skilled SLP intervention applied during facilitation of resonant voice therapy techniques and through implementation of speech intelligibility strategies for dysarthria. SLP demonstrated  breathing, straw phonation, vocal projection, and y-buzz techniques.       Assessment / Recommendations / Plan   Plan  Continue with current plan of care      Progression Toward Goals   Progression toward goals  Progressing toward goals       SLP Education - 10/31/17 1832    Education Details  Provided HEP to complete breathing and voice exercises at home    Person(s) Educated  Patient    Methods  Explanation;Handout    Comprehension  Verbalized understanding       SLP Short Term Goals - 10/31/17 1837      SLP SHORT TERM GOAL #1   Title  The patient will use a strong, clear voice when communicating with family, friends and/or  colleagues resulting in an 80% reduction in requests for repetition.    Time  4    Period  Weeks    Status  On-going      SLP SHORT TERM GOAL #2   Title  Regularly practice voice building/strengthening exercises a minimum of 5 days/week for a 20+ minutes a day.    Baseline  Inconsistent completion at home    Time  4    Period  Weeks    Status  On-going       SLP Long Term Goals - 10/31/17 1838      SLP LONG TERM GOAL #1   Title  Same as short       Plan - 10/31/17 1834    Clinical Impression Statement  Mild hypokinetic dysarthria persists, with fluctuating presentations throughout the day per Pt. Pt acknowledges the need to complete HEP more consistently. Pt responded well to y-buzz technique this date. She has difficulty transitioning from automatic speech tasks to unstructured conversation and needs additional reminders for implementation of breathing and voice strategies. Plan to continue to straw phonation and y-buzz next session.    Speech Therapy Frequency  1x /week    Duration  4 weeks    Treatment/Interventions  Cueing hierarchy;SLP instruction and feedback;Compensatory techniques;Compensatory strategies;Internal/external aids;Patient/family education    Potential to Achieve Goals  Good    Potential Considerations  Ability to learn/carryover information    SLP Home Exercise Plan  Pt will complete HEP as assigned to facilitate carryover of treatment strategies and techniques in home environment.    Consulted and Agree with Plan of Care  Patient       Patient will benefit from skilled therapeutic intervention in order to improve the following deficits and impairments:   Dysarthria and anarthria    Problem List Patient Active Problem List   Diagnosis Date Noted  . Atrial fibrillation with RVR ([place]) 09/29/2017  . RBD (REM behavioral disorder) 01/10/2017  . Neurogenic orthostatic hypotension ([place]) 01/10/2017  . Tachy-brady syndrome ([place]) 06/23/2016  . Closed subcapital  fracture of left femur ([place]) 06/23/2016  . Orthostatic hypotension due to Parkinson's disease ([place]) 06/23/2016  . PD (Parkinson's disease) ([place]) 04/21/2016  . Hypokalemia 04/21/2016  . Normal coronary arteries 2001 05/08/2014  . Pacemaker 08/10/2012  . Aortic insufficiency 08/10/2012  . Constipation 12/27/2011  . Family hx of colon cancer 12/27/2011  . HTN (hypertension) 07/06/2011  . Hypothyroidism 07/05/2011  . Paroxysmal atrial fibrillation ([place]) 07/05/2011  . Sinus pause, 7 seconds post conversion, S/P MDT pacemaker 07/05/11 07/05/2011  . Dyslipidemia 07/05/2011  . Angina at rest, with rapid AF 07/05/2011   Thank you,  Genene Churn, [place]  [place] 10/31/2017, 6:38 PM  [place], [place], [address] Phone: [phone]   Fax:  [phone]   Name: Abigail Taylor MRN: [id number] Date of Birth: November 23, 1931

## 2017-11-01 ENCOUNTER — Encounter (HOSPITAL_COMMUNITY): Payer: Self-pay

## 2017-11-01 ENCOUNTER — Ambulatory Visit (HOSPITAL_COMMUNITY): Payer: Medicare Other

## 2017-11-01 DIAGNOSIS — R471 Dysarthria and anarthria: Secondary | ICD-10-CM | POA: Diagnosis not present

## 2017-11-01 DIAGNOSIS — R2681 Unsteadiness on feet: Secondary | ICD-10-CM | POA: Diagnosis not present

## 2017-11-01 DIAGNOSIS — M6281 Muscle weakness (generalized): Secondary | ICD-10-CM | POA: Diagnosis not present

## 2017-11-01 DIAGNOSIS — R2689 Other abnormalities of gait and mobility: Secondary | ICD-10-CM | POA: Diagnosis not present

## 2017-11-01 DIAGNOSIS — R293 Abnormal posture: Secondary | ICD-10-CM | POA: Diagnosis not present

## 2017-11-01 NOTE — Therapy (Signed)
[place], [place], [address] Phone: [phone]   Fax:  [phone]    Progress Note Reporting Period 09/21/17 to 11/01/17  See note below for Objective Data and Assessment of Progress/Goals.   Physical Therapy Treatment  Patient Details  Name: Abigail Taylor MRN: [id number] Date of Birth: [birth year] Referring Provider (PT): Alonza Bogus, DO   Encounter Date: 11/01/2017  PT End of Session - 11/01/17 1302    Visit Number  2    Number of Visits  11    Date for PT Re-Evaluation  11/22/17    Authorization Type  Medicare (secondary: generic commercial)    Authorization Time Period  09/21/17 to 10/19/17; NEW: 11/01/17 to 11/22/17    Authorization - Visit Number  2    Authorization - Number of Visits  11    PT Start Time  1301    PT Stop Time  1343    PT Time Calculation (min)  42 min    Equipment Utilized During Treatment  Gait belt    Activity Tolerance  Patient tolerated treatment well;No increased pain    Behavior During Therapy  WFL for tasks assessed/performed       Past Medical History:  Diagnosis Date  . Angina at rest, with the tachycardia 07/05/2011  . Aortic insufficiency 07/05/2011  . Atrial fibrillation ([place])   . Brady-tachy syndrome ([place])    Dual chamber Medtronic pacemaker Adapta  . Chronic back pain   . Constipation 12/27/2011  . Gait instability   . Hyperlipidemia   . Hypothyroidism   . Normal cardiac stress test 2013  . OA (osteoarthritis)   . Pacemaker   . Parkinson's disease ([place])   . Tremor     Past Surgical History:  Procedure Laterality Date  . ABDOMINAL HYSTERECTOMY    . CARDIAC CATHETERIZATION  05/26/1999   normal  . CARDIAC CATHETERIZATION  2001   normal coronary arteries  . cataract    . COLONOSCOPY N/A 11/15/2012   Procedure: COLONOSCOPY;  Surgeon: [name], MD;  [facility];  Service: Endoscopy;  Laterality: N/A;  1030  . HIP ARTHROPLASTY Left 06/24/2016   Procedure: ARTHROPLASTY BIPOLAR HIP (HEMIARTHROPLASTY);  Surgeon: Altamese Deport, MD;  Location: [facility];  Service: Orthopedics;  Laterality: Left;  . INSERT / REPLACE / REMOVE PACEMAKER    . NM MYOVIEW LTD  07/19/2011   normal  . PERMANENT PACEMAKER INSERTION  07/05/2011   Medtronic Adapta dual chamber  . PERMANENT PACEMAKER INSERTION N/A 07/05/2011   Procedure: PERMANENT PACEMAKER INSERTION;  Surgeon: [name], MD;  Location: [facility];  Service: Cardiovascular;  Laterality: N/A;    There were no vitals filed for this visit.  Subjective Assessment - 11/01/17 1302    Subjective  Pt states she was admitted to the hospital for 6 days in early September due to irregular heart rate. During her admission, she had issues with low BP but she was finally able to be discharged home. She states that her balance is more off since her hospital admission/discharge.    Limitations  Walking;Standing;House hold activities    How long can you sit comfortably?  no issues    How long can you stand comfortably?  no real issues, just the standing up from sitting from a couch or soft chair is difficult due to her back pain    How long can you walk comfortably?  10 mins    Patient Stated Goals  be able to walk better    Currently in Pain?  No/denies         [facility] PT Assessment - 11/01/17 0001      Assessment   Medical Diagnosis  Parkinson's Disease    Referring Provider (PT)  Alonza Bogus, DO    Next MD Visit  6 months    Prior Therapy  yes for L THA (d/c'd 08/2016), and Parkinson's earlier this year      Functional Tests   Functional tests  Sit to Stand      Sit to Stand   Comments  30 sec chair rise test: 7x   was 11x     Strength   Right Hip Extension  3+/5   was 3+   Left Hip Extension  3/5   was 3+     Balance   Balance Assessed  Yes      Static Standing Balance   Static Standing - Balance Support  No upper extremity supported    Static Standing Balance -  Activities   Single Leg  Stance - Right Leg;Single Leg Stance - Left Leg    Static Standing - Comment/# of Minutes  R: 10.5sec L: 12.5sec   R: was 12sec or >, L: was 10 sec or <     Functional Gait  Assessment   Gait assessed   Yes    Gait Level Surface  Walks 20 ft in less than 5.5 sec, no assistive devices, good speed, no evidence for imbalance, normal gait pattern, deviates no more than 6 in outside of the 12 in walkway width.    Change in Gait Speed  Able to smoothly change walking speed without loss of balance or gait deviation. Deviate no more than 6 in outside of the 12 in walkway width.    Gait with Horizontal Head Turns  Performs head turns smoothly with slight change in gait velocity (eg, minor disruption to smooth gait path), deviates 6-10 in outside 12 in walkway width, or uses an assistive device.    Gait with Vertical Head Turns  Performs task with slight change in gait velocity (eg, minor disruption to smooth gait path), deviates 6 - 10 in outside 12 in walkway width or uses assistive device    Gait and Pivot Turn  Pivot turns safely within 3 sec and stops quickly with no loss of balance.    Step Over Obstacle  Is able to step over one shoe box (4.5 in total height) but must slow down and adjust steps to clear box safely. May require verbal cueing.    Gait with Narrow Base of Support  Ambulates 4-7 steps.    Gait with Eyes Closed  Walks 20 ft, slow speed, abnormal gait pattern, evidence for imbalance, deviates 10-15 in outside 12 in walkway width. Requires more than 9 sec to ambulate 20 ft.    Ambulating Backwards  Walks 20 ft, slow speed, abnormal gait pattern, evidence for imbalance, deviates 10-15 in outside 12 in walkway width.    Steps  Alternating feet, must use rail.    Total Score  19    FGA comment:  was 22/30             [facility] Adult PT Treatment/Exercise - 11/01/17 0001      Exercises   Exercises  Knee/Hip      Knee/Hip Exercises: Supine   Bridges  Both;2 sets;10 reps      Knee/Hip  Exercises: Sidelying   Clams  BLE, RTB x15 each            PT Short Term Goals - 11/01/17 1306      PT SHORT TERM GOAL #1   Title  Pt will score 25/30 or > on the FGA to demo improved dynamic balance and decrease her risk for falls.    Baseline  10/9: 19/30 (was 22/30)    Time  2    Period  Weeks    Status  On-going      PT SHORT TERM GOAL #2   Title  Pt will be able to perform 14 STS during 30sec chair rise test in order to demo improved hip extension strength and overall functional strength.    Baseline  10/9: 7x, was 11x    Time  2    Period  Weeks    Status  On-going        PT Long Term Goals - 11/01/17 1306      PT LONG TERM GOAL #1   Title  Pt's bil hip ext MMT will improve to 4/5 or better in order to maximize balance.    Baseline  10/9: see MMT    Time  4    Period  Weeks    Status  On-going      PT LONG TERM GOAL #2   Title  Pt will be able to perform bil SLS for 20 sec or > in order to demo improved static balance and to maximize her gait on uneven ground and stair ambulation.    Baseline  10/9: R: 10.5sec, L: 12.5sec    Time  4    Period  Weeks    Status  On-going            Plan - 11/01/17 1357    Clinical Impression Statement  Pt returns to therapy after hospital admission for 6 days 09/29/17 to 10/05/17 due to HR irregularity and then subsequent low BP. She states that since her d/c, her balance has been worse PT reassessed pt's goals and outcome measures and she was noted to have a slight decrease in MMT as well as a slight decrease in FGA score. She still falls within medium fall risk on the FGA as she was 19/30 today whereas she was 22/30 at her evaluation. Pt needs continued skilled PT intervention and PT feels pt would benefit form 2x/week for 3 weeks this POC to focus on functional strengthening and dynamic balance in order to maximize return to PLOF and decrease her risk for falls.    Rehab Potential  Good    PT Frequency  1x / week    PT  Duration  4 weeks    PT Treatment/Interventions  ADLs/Self Care Home Management;Aquatic Therapy;Cryotherapy;Moist Heat;Ultrasound;Gait training;Stair training;Functional mobility training;Therapeutic activities;Therapeutic exercise;Balance training;Neuromuscular re-education;Patient/family education;Manual techniques;Passive range of motion;Dry needling;Taping    PT Next Visit Plan  functional strengthening, static and dynamic balance activities, updating HEP regularly for at home matinenance    PT Home Exercise Plan  01/26/17: bridging, STS; 1/16: HS stretch in supine, PWR! Sitting; 1/28: seated adductor stretching; 2/13: standing and seated PWR! moves;   8/29 eval: continue previous HEP, SLS    Consulted and Agree with Plan of Care  Patient       Patient will benefit from skilled therapeutic intervention in order to improve the following deficits and impairments:  Decreased balance, Decreased activity tolerance, Decreased strength, Hypomobility, Postural dysfunction, Improper body mechanics, Pain  Visit Diagnosis: Unsteadiness on feet -  Plan: PT plan of care cert/re-cert  Other abnormalities of gait and mobility - Plan: PT plan of care cert/re-cert     Problem List Patient Active Problem List   Diagnosis Date Noted  . Atrial fibrillation with RVR ([place]) 09/29/2017  . RBD (REM behavioral disorder) 01/10/2017  . Neurogenic orthostatic hypotension ([place]) 01/10/2017  . Tachy-brady syndrome ([place]) 06/23/2016  . Closed subcapital fracture of left femur ([place]) 06/23/2016  . Orthostatic hypotension due to Parkinson's disease ([place]) 06/23/2016  . PD (Parkinson's disease) ([place]) 04/21/2016  . Hypokalemia 04/21/2016  . Normal coronary arteries 2001 05/08/2014  . Pacemaker 08/10/2012  . Aortic insufficiency 08/10/2012  . Constipation 12/27/2011  . Family hx of colon cancer 12/27/2011  . HTN (hypertension) 07/06/2011  . Hypothyroidism 07/05/2011  . Paroxysmal atrial fibrillation ([place]) 07/05/2011  .  Sinus pause, 7 seconds post conversion, S/P MDT pacemaker 07/05/11 07/05/2011  . Dyslipidemia 07/05/2011  . Angina at rest, with rapid AF 07/05/2011       Geraldine Solar PT, DPT  [facility] [address] [place], [place], [address] Phone: [phone]   Fax:  [phone]  Name: SAHRA CONVERSE MRN: [id number] Date of Birth: 28-Jan-1931

## 2017-11-02 ENCOUNTER — Ambulatory Visit (INDEPENDENT_AMBULATORY_CARE_PROVIDER_SITE_OTHER): Payer: Medicare Other | Admitting: *Deleted

## 2017-11-02 ENCOUNTER — Emergency Department (HOSPITAL_COMMUNITY): Payer: Medicare Other

## 2017-11-02 ENCOUNTER — Other Ambulatory Visit: Payer: Self-pay

## 2017-11-02 ENCOUNTER — Telehealth: Payer: Self-pay

## 2017-11-02 ENCOUNTER — Emergency Department (HOSPITAL_COMMUNITY)
Admission: EM | Admit: 2017-11-02 | Discharge: 2017-11-02 | Disposition: A | Discharge status: Home / Self Care | Payer: Medicare Other | Attending: Emergency Medicine | Admitting: Emergency Medicine

## 2017-11-02 ENCOUNTER — Encounter (HOSPITAL_COMMUNITY): Payer: Self-pay

## 2017-11-02 DIAGNOSIS — G9389 Other specified disorders of brain: Secondary | ICD-10-CM | POA: Diagnosis not present

## 2017-11-02 DIAGNOSIS — R52 Pain, unspecified: Secondary | ICD-10-CM | POA: Diagnosis not present

## 2017-11-02 DIAGNOSIS — M4050 Lordosis, unspecified, site unspecified: Secondary | ICD-10-CM | POA: Diagnosis not present

## 2017-11-02 DIAGNOSIS — T07XXXA Unspecified multiple injuries, initial encounter: Secondary | ICD-10-CM | POA: Diagnosis present

## 2017-11-02 DIAGNOSIS — I10 Essential (primary) hypertension: Secondary | ICD-10-CM | POA: Insufficient documentation

## 2017-11-02 DIAGNOSIS — R270 Ataxia, unspecified: Secondary | ICD-10-CM | POA: Diagnosis not present

## 2017-11-02 DIAGNOSIS — W101XXA Fall (on)(from) sidewalk curb, initial encounter: Secondary | ICD-10-CM | POA: Diagnosis not present

## 2017-11-02 DIAGNOSIS — Z7901 Long term (current) use of anticoagulants: Secondary | ICD-10-CM | POA: Diagnosis not present

## 2017-11-02 DIAGNOSIS — Y9301 Activity, walking, marching and hiking: Secondary | ICD-10-CM | POA: Insufficient documentation

## 2017-11-02 DIAGNOSIS — S7002XA Contusion of left hip, initial encounter: Secondary | ICD-10-CM | POA: Diagnosis not present

## 2017-11-02 DIAGNOSIS — I495 Sick sinus syndrome: Secondary | ICD-10-CM

## 2017-11-02 DIAGNOSIS — S51819A Laceration without foreign body of unspecified forearm, initial encounter: Secondary | ICD-10-CM | POA: Insufficient documentation

## 2017-11-02 DIAGNOSIS — G20 Parkinson's disease: Secondary | ICD-10-CM | POA: Insufficient documentation

## 2017-11-02 DIAGNOSIS — Y999 Unspecified external cause status: Secondary | ICD-10-CM | POA: Insufficient documentation

## 2017-11-02 DIAGNOSIS — Z79899 Other long term (current) drug therapy: Secondary | ICD-10-CM | POA: Diagnosis not present

## 2017-11-02 DIAGNOSIS — M542 Cervicalgia: Secondary | ICD-10-CM | POA: Diagnosis not present

## 2017-11-02 DIAGNOSIS — I4510 Unspecified right bundle-branch block: Secondary | ICD-10-CM | POA: Diagnosis not present

## 2017-11-02 DIAGNOSIS — S40012A Contusion of left shoulder, initial encounter: Secondary | ICD-10-CM | POA: Diagnosis not present

## 2017-11-02 DIAGNOSIS — W19XXXA Unspecified fall, initial encounter: Secondary | ICD-10-CM | POA: Diagnosis not present

## 2017-11-02 DIAGNOSIS — E039 Hypothyroidism, unspecified: Secondary | ICD-10-CM | POA: Insufficient documentation

## 2017-11-02 DIAGNOSIS — Y92480 Sidewalk as the place of occurrence of the external cause: Secondary | ICD-10-CM | POA: Diagnosis not present

## 2017-11-02 DIAGNOSIS — Z950 Presence of cardiac pacemaker: Secondary | ICD-10-CM | POA: Diagnosis not present

## 2017-11-02 NOTE — ED Notes (Signed)
Pt assisted in use of bed pan. Pt asking for husband 'Phillip'.

## 2017-11-02 NOTE — ED Notes (Signed)
Pt ambulates with walker. Pt ambulates to restroom and back. Pt steady with walker and feels strong on feet. Feels tightness in L Hip/groin, but not painful. Pt needs minimal assistance.

## 2017-11-02 NOTE — ED Notes (Signed)
Family @ bedside.

## 2017-11-02 NOTE — ED Provider Notes (Signed)
[facility] EMERGENCY DEPARTMENT Provider Note   CSN: [id number] Arrival date & time: 11/02/17  1449     History   Chief Complaint Chief Complaint  Patient presents with  . Fall    HPI Abigail Taylor is a 84 y.o. female.  HPI  84 y/o F with hx of parkinsons, Afib comes in after a fall. Pt had a mechanical fall earlier today. Pt denies any chest pain, dib, extremity pain. + abrasions on the palm. No headaches, neck pain and no LOC. Pt is not on any blood thinners.  Past Medical History:  Diagnosis Date  . Angina at rest, with the tachycardia 07/05/2011  . Aortic insufficiency 07/05/2011  . Atrial fibrillation ([place])   . Brady-tachy syndrome ([place])    Dual chamber Medtronic pacemaker Adapta  . Chronic back pain   . Constipation 12/27/2011  . Gait instability   . Hyperlipidemia   . Hypothyroidism   . Normal cardiac stress test 2013  . OA (osteoarthritis)   . Pacemaker   . Parkinson's disease ([place])   . Tremor     Patient Active Problem List   Diagnosis Date Noted  . Atrial fibrillation with RVR ([place]) 09/29/2017  . RBD (REM behavioral disorder) 01/10/2017  . Neurogenic orthostatic hypotension ([place]) 01/10/2017  . Tachy-brady syndrome ([place]) 06/23/2016  . Closed subcapital fracture of left femur ([place]) 06/23/2016  . Orthostatic hypotension due to Parkinson's disease ([place]) 06/23/2016  . PD (Parkinson's disease) ([place]) 04/21/2016  . Hypokalemia 04/21/2016  . Normal coronary arteries 2001 05/08/2014  . Pacemaker 08/10/2012  . Aortic insufficiency 08/10/2012  . Constipation 12/27/2011  . Family hx of colon cancer 12/27/2011  . HTN (hypertension) 07/06/2011  . Hypothyroidism 07/05/2011  . Paroxysmal atrial fibrillation ([place]) 07/05/2011  . Sinus pause, 7 seconds post conversion, S/P MDT pacemaker 07/05/11 07/05/2011  . Dyslipidemia 07/05/2011  . Angina at rest, with rapid AF 07/05/2011    Past Surgical History:  Procedure Laterality Date  . ABDOMINAL  HYSTERECTOMY    . CARDIAC CATHETERIZATION  05/26/1999   normal  . CARDIAC CATHETERIZATION  2001   normal coronary arteries  . cataract    . COLONOSCOPY N/A 11/15/2012   Procedure: COLONOSCOPY;  Surgeon: [name], MD;  [facility];  Service: Endoscopy;  Laterality: N/A;  1030  . HIP ARTHROPLASTY Left 06/24/2016   Procedure: ARTHROPLASTY BIPOLAR HIP (HEMIARTHROPLASTY);  Surgeon: Altamese , MD;  Location: [facility];  Service: Orthopedics;  Laterality: Left;  . INSERT / REPLACE / REMOVE PACEMAKER    . NM MYOVIEW LTD  07/19/2011   normal  . PERMANENT PACEMAKER INSERTION  07/05/2011   Medtronic Adapta dual chamber  . PERMANENT PACEMAKER INSERTION N/A 07/05/2011   Procedure: PERMANENT PACEMAKER INSERTION;  Surgeon: [name], MD;  Location: [facility];  Service: Cardiovascular;  Laterality: N/A;     OB History   None      Home Medications    Prior to Admission medications   Medication Sig Start Date End Date Taking? Authorizing Provider  amiodarone (PACERONE) 200 MG tablet Take 200 mg daily with largest meal 10/24/17  Yes Barrett, Rhonda G, PA-C  bisacodyl (DULCOLAX) 10 MG suppository Place 10 mg rectally as needed for moderate constipation.   Yes [provider]  carbidopa-levodopa (SINEMET CR) 50-200 MG tablet TAKE 1 TABLET BY MOUTH AT BEDTIME 07/18/17  Yes Tat, Rebecca S, DO  carbidopa-levodopa (SINEMET IR) 25-100 MG tablet Take 1 tablet by mouth 3 (three) times  daily with meals. 10/04/17  Yes Emokpae, Courage, MD  ELIQUIS 2.5 MG TABS tablet TAKE 1 TABLET BY MOUTH TWICE A DAY Patient taking differently: Take 2.5 mg by mouth 2 (two) times daily.  10/31/17  Yes Croitoru, Mihai, MD  levothyroxine (SYNTHROID, LEVOTHROID) 75 MCG tablet Take 75 mcg by mouth daily before breakfast.   Yes [provider]  rosuvastatin (CRESTOR) 10 MG tablet Take 10 mg by mouth at bedtime. Reported on 07/09/2015   Yes [provider]  senna-docusate (SENOKOT-S) 8.6-50  MG tablet Take 1 tablet by mouth at bedtime as needed for mild constipation. 06/27/16  Yes Alphonzo Grieve, MD  XIIDRA 5 % SOLN Place 1 drop into the left eye 2 (two) times daily.  10/04/16  Yes [provider]  fludrocortisone (FLORINEF) 0.1 MG tablet Take 1 tablet (100 mcg total) by mouth daily. Patient not taking: Reported on 11/02/2017 04/13/17   Croitoru, Dani Gobble, MD    Family History Family History  Problem Relation Age of Onset  . Colon cancer Mother 68  . Heart attack Mother   . Cancer Father   . Suicidality Son     Social History Social History   Tobacco Use  . Smoking status: Never Smoker  . Smokeless tobacco: Never Used  Substance Use Topics  . Alcohol use: No    Alcohol/week: 0.0 standard drinks  . Drug use: No     Allergies   Patient has no known allergies.   Review of Systems Review of Systems  Constitutional: Positive for activity change.  Respiratory: Negative for shortness of breath.   Cardiovascular: Negative for chest pain.  Gastrointestinal: Negative for abdominal pain.  Musculoskeletal: Positive for arthralgias. Negative for back pain.  Skin: Positive for wound.  Neurological: Negative for headaches.  Hematological: Does not bruise/bleed easily.     Physical Exam Updated Vital Signs BP (!) 168/88   Pulse 62   Resp 16   Ht 5\' 6"  (1.676 m)   Wt 54.4 kg   SpO2 98%   BMI 19.37 kg/m   Physical Exam  Constitutional: She is oriented to person, place, and time. She appears well-developed.  HENT:  Head: Normocephalic and atraumatic.  Eyes: EOM are normal.  Neck: Neck supple.  No midline c-spine tenderness, pt able to turn head to 45 degrees bilaterally without any pain and able to flex neck to the chest and extend without any pain or neurologic symptoms.   Cardiovascular: Normal rate.  Pulmonary/Chest: Effort normal.  Abdominal: Bowel sounds are normal.  Musculoskeletal:  Skin tear at the forearm. No deformity. No significant  tenderness  Neurological: She is alert and oriented to person, place, and time.  Head to toe evaluation shows no hematoma, bleeding of the scalp, no facial abrasions, no spine step offs, crepitus of the chest or neck, no tenderness to palpation of the bilateral upper and lower extremities, no gross deformities, no chest tenderness, no pelvic pain.   Skin: Skin is warm and dry.  Nursing note and vitals reviewed.    ED Treatments / Results  Labs (all labs ordered are listed, but only abnormal results are displayed) Labs Reviewed - No data to display  EKG EKG Interpretation  Date/Time:  Thursday November 02 2017 15:24:36 EDT Ventricular Rate:  65 PR Interval:    QRS Duration: 143 QT Interval:  522 QTC Calculation: 543 R Axis:   86 Text Interpretation:  Sinus rhythm Right bundle branch block Inferior infarct, age indeterminate No acute changes No significant change  since last tracing Confirmed by Varney Biles [phone]) on 11/02/2017 5:50:38 PM   Radiology Ct Head Wo Contrast  Result Date: 11/02/2017 CLINICAL DATA:  Huge appear EXAM: CT HEAD WITHOUT CONTRAST CT CERVICAL SPINE WITHOUT CONTRAST TECHNIQUE: Multidetector CT imaging of the head and cervical spine was performed following the standard protocol without intravenous contrast. Multiplanar CT image reconstructions of the cervical spine were also generated. COMPARISON:  None. FINDINGS: CT HEAD FINDINGS Brain: Mild age related involutional changes the brain with chronic appearing minimal small vessel ischemic disease of periventricular white matter. No hydrocephalus. Midline fourth ventricle and basal cisterns without effacement. The cerebellum and brainstem are nonacute. No intra-axial mass nor extra-axial fluid collections. No large vascular territory infarct. Vascular: No hyperdense vessel sign Skull: Mild hyperostosis frontalis of the skull. No acute skull fracture. Sinuses/Orbits: No acute finding. Other: Right mastoid effusion  likely representing otomastoiditis. No fracture is identified or significant overlying soft tissue swelling. CT CERVICAL SPINE FINDINGS Alignment: Maintained cervical lordosis Skull base and vertebrae: Intact skull base. No acute cervical spine fracture or static listhesis. Soft tissues and spinal canal: No prevertebral soft tissue swelling. No visible canal hematoma. Disc levels: Marked disc flattening at C5-6 and C6-7. No significant central or foraminal stenosis. Mild multilevel bilateral cervical facet arthropathy. Upper chest: Scarring at the apices. Other: None IMPRESSION: CT head: 1. Right mastoid effusion likely secondary to chronic otomastoiditis. No fracture of the mastoid is seen. 2. Mild age related involutional changes of the brain without acute intracranial abnormality. CT cervical spine: Cervical spondylosis C5 through C7. No acute cervical spine fracture. Electronically Signed   By: Ashley Royalty M.D.   On: 11/02/2017 17:33   Ct Cervical Spine Wo Contrast  Result Date: 11/02/2017 CLINICAL DATA:  Huge appear EXAM: CT HEAD WITHOUT CONTRAST CT CERVICAL SPINE WITHOUT CONTRAST TECHNIQUE: Multidetector CT imaging of the head and cervical spine was performed following the standard protocol without intravenous contrast. Multiplanar CT image reconstructions of the cervical spine were also generated. COMPARISON:  None. FINDINGS: CT HEAD FINDINGS Brain: Mild age related involutional changes the brain with chronic appearing minimal small vessel ischemic disease of periventricular white matter. No hydrocephalus. Midline fourth ventricle and basal cisterns without effacement. The cerebellum and brainstem are nonacute. No intra-axial mass nor extra-axial fluid collections. No large vascular territory infarct. Vascular: No hyperdense vessel sign Skull: Mild hyperostosis frontalis of the skull. No acute skull fracture. Sinuses/Orbits: No acute finding. Other: Right mastoid effusion likely representing  otomastoiditis. No fracture is identified or significant overlying soft tissue swelling. CT CERVICAL SPINE FINDINGS Alignment: Maintained cervical lordosis Skull base and vertebrae: Intact skull base. No acute cervical spine fracture or static listhesis. Soft tissues and spinal canal: No prevertebral soft tissue swelling. No visible canal hematoma. Disc levels: Marked disc flattening at C5-6 and C6-7. No significant central or foraminal stenosis. Mild multilevel bilateral cervical facet arthropathy. Upper chest: Scarring at the apices. Other: None IMPRESSION: CT head: 1. Right mastoid effusion likely secondary to chronic otomastoiditis. No fracture of the mastoid is seen. 2. Mild age related involutional changes of the brain without acute intracranial abnormality. CT cervical spine: Cervical spondylosis C5 through C7. No acute cervical spine fracture. Electronically Signed   By: Ashley Royalty M.D.   On: 11/02/2017 17:33    Procedures Procedures (including critical care time)  Medications Ordered in ED Medications - No data to display   Initial Impression / Assessment and Plan / ED Course  I have reviewed the triage vital signs and  the nursing notes.  Pertinent labs & imaging results that were available during my care of the patient were reviewed by me and considered in my medical decision making (see chart for details).  Clinical Course as of Nov 02 1948  Thu Nov 02, 2017  1946 Pt has ambulated with a walker. Doubt hip fx. Will dc.   [AN]    Clinical Course User Index [AN] Varney Biles, MD    Pt comes in with cc of fall.  DDx includes: - Mechanical falls - ICH - Fractures - Contusions - Soft tissue injury  Ct head and C-spine ordered due to her complaining of neck pain earlier and the age.   Final Clinical Impressions(s) / ED Diagnoses   Final diagnoses:  Fall, initial encounter  Multiple contusions  Ataxia    ED Discharge Orders    None       Varney Biles,  MD 11/02/17 [phone]

## 2017-11-02 NOTE — Progress Notes (Signed)
Remote pacemaker transmission.   

## 2017-11-02 NOTE — Discharge Instructions (Addendum)
We saw you in the ER after you had a fall. °All the imaging results are normal, no fractures seen. No evidence of brain bleed. °Please be very careful with walking, and do everything possible to prevent falls. ° ° °

## 2017-11-02 NOTE — Telephone Encounter (Signed)
Spoke with pt and reminded pt of remote transmission that is due today. Pt verbalized understanding.   

## 2017-11-02 NOTE — ED Triage Notes (Signed)
Patient walking to store and tripped over a curb. Patient states hand abrasions, enter neck pain, left shoulder pain and her left hip "feels funny". No obvious deformity.

## 2017-11-04 ENCOUNTER — Encounter (HOSPITAL_COMMUNITY): Payer: Self-pay | Admitting: Emergency Medicine

## 2017-11-04 ENCOUNTER — Observation Stay (HOSPITAL_COMMUNITY)
Admission: EM | Admit: 2017-11-04 | Discharge: 2017-11-07 | Disposition: A | Discharge status: Home / Self Care | Payer: Medicare Other | Attending: Internal Medicine | Admitting: Internal Medicine

## 2017-11-04 ENCOUNTER — Emergency Department (HOSPITAL_COMMUNITY): Payer: Medicare Other

## 2017-11-04 ENCOUNTER — Other Ambulatory Visit: Payer: Self-pay

## 2017-11-04 DIAGNOSIS — I10 Essential (primary) hypertension: Secondary | ICD-10-CM | POA: Diagnosis present

## 2017-11-04 DIAGNOSIS — K5909 Other constipation: Secondary | ICD-10-CM | POA: Diagnosis not present

## 2017-11-04 DIAGNOSIS — Z79899 Other long term (current) drug therapy: Secondary | ICD-10-CM | POA: Insufficient documentation

## 2017-11-04 DIAGNOSIS — Z7901 Long term (current) use of anticoagulants: Secondary | ICD-10-CM | POA: Insufficient documentation

## 2017-11-04 DIAGNOSIS — E785 Hyperlipidemia, unspecified: Secondary | ICD-10-CM | POA: Diagnosis not present

## 2017-11-04 DIAGNOSIS — I351 Nonrheumatic aortic (valve) insufficiency: Secondary | ICD-10-CM | POA: Diagnosis not present

## 2017-11-04 DIAGNOSIS — E039 Hypothyroidism, unspecified: Secondary | ICD-10-CM | POA: Diagnosis present

## 2017-11-04 DIAGNOSIS — G20 Parkinson's disease: Secondary | ICD-10-CM

## 2017-11-04 DIAGNOSIS — K5790 Diverticulosis of intestine, part unspecified, without perforation or abscess without bleeding: Secondary | ICD-10-CM

## 2017-11-04 DIAGNOSIS — Z96642 Presence of left artificial hip joint: Secondary | ICD-10-CM | POA: Diagnosis not present

## 2017-11-04 DIAGNOSIS — G8929 Other chronic pain: Secondary | ICD-10-CM | POA: Diagnosis not present

## 2017-11-04 DIAGNOSIS — K921 Melena: Secondary | ICD-10-CM | POA: Diagnosis not present

## 2017-11-04 DIAGNOSIS — I951 Orthostatic hypotension: Secondary | ICD-10-CM | POA: Insufficient documentation

## 2017-11-04 DIAGNOSIS — Z800 Family history of malignant neoplasm of digestive organs: Secondary | ICD-10-CM | POA: Insufficient documentation

## 2017-11-04 DIAGNOSIS — R109 Unspecified abdominal pain: Secondary | ICD-10-CM | POA: Diagnosis not present

## 2017-11-04 DIAGNOSIS — E876 Hypokalemia: Secondary | ICD-10-CM | POA: Insufficient documentation

## 2017-11-04 DIAGNOSIS — K5730 Diverticulosis of large intestine without perforation or abscess without bleeding: Secondary | ICD-10-CM | POA: Diagnosis not present

## 2017-11-04 DIAGNOSIS — Z950 Presence of cardiac pacemaker: Secondary | ICD-10-CM | POA: Insufficient documentation

## 2017-11-04 DIAGNOSIS — I480 Paroxysmal atrial fibrillation: Secondary | ICD-10-CM | POA: Diagnosis present

## 2017-11-04 DIAGNOSIS — Z79890 Hormone replacement therapy (postmenopausal): Secondary | ICD-10-CM | POA: Diagnosis not present

## 2017-11-04 DIAGNOSIS — Z8249 Family history of ischemic heart disease and other diseases of the circulatory system: Secondary | ICD-10-CM | POA: Diagnosis not present

## 2017-11-04 DIAGNOSIS — R1013 Epigastric pain: Secondary | ICD-10-CM | POA: Diagnosis not present

## 2017-11-04 DIAGNOSIS — M1990 Unspecified osteoarthritis, unspecified site: Secondary | ICD-10-CM | POA: Diagnosis not present

## 2017-11-04 DIAGNOSIS — I495 Sick sinus syndrome: Secondary | ICD-10-CM | POA: Diagnosis not present

## 2017-11-04 DIAGNOSIS — R1010 Upper abdominal pain, unspecified: Secondary | ICD-10-CM | POA: Diagnosis present

## 2017-11-04 LAB — COMPREHENSIVE METABOLIC PANEL
ALBUMIN: 3.8 g/dL (ref 3.5–5.0)
ALT: 13 U/L (ref 0–44)
ANION GAP: 7 (ref 5–15)
AST: 17 U/L (ref 15–41)
Alkaline Phosphatase: 80 U/L (ref 38–126)
BUN: 18 mg/dL (ref 8–23)
CALCIUM: 8.7 mg/dL — AB (ref 8.9–10.3)
CHLORIDE: 103 mmol/L (ref 98–111)
CO2: 29 mmol/L (ref 22–32)
CREATININE: 1.04 mg/dL — AB (ref 0.44–1.00)
GFR calc Af Amer: 56 mL/min — ABNORMAL LOW (ref >60)
GFR calc non Af Amer: 48 mL/min — ABNORMAL LOW (ref >60)
GLUCOSE: 109 mg/dL — AB (ref 70–99)
POTASSIUM: 3.4 mmol/L — AB (ref 3.5–5.1)
Sodium: 139 mmol/L (ref 135–145)
Total Bilirubin: 0.8 mg/dL (ref 0.3–1.2)
Total Protein: 6.6 g/dL (ref 6.5–8.1)

## 2017-11-04 LAB — URINALYSIS, ROUTINE W REFLEX MICROSCOPIC
Bacteria, UA: NONE SEEN
Bilirubin Urine: NEGATIVE
GLUCOSE, UA: NEGATIVE mg/dL
Ketones, ur: 5 mg/dL — AB
NITRITE: NEGATIVE
PROTEIN: NEGATIVE mg/dL
Specific Gravity, Urine: 1.015 (ref 1.005–1.030)
pH: 5 (ref 5.0–8.0)

## 2017-11-04 LAB — CBC WITH DIFFERENTIAL/PLATELET
ABS IMMATURE GRANULOCYTES: 0.04 10*3/uL (ref 0.00–0.07)
BASOS ABS: 0 10*3/uL (ref 0.0–0.1)
Basophils Relative: 0 %
Eosinophils Absolute: 0.2 10*3/uL (ref 0.0–0.5)
Eosinophils Relative: 2 %
HCT: 42.1 % (ref 36.0–46.0)
HEMOGLOBIN: 13.5 g/dL (ref 12.0–15.0)
Immature Granulocytes: 0 %
Lymphocytes Relative: 11 %
Lymphs Abs: 1.2 10*3/uL (ref 0.7–4.0)
MCH: 27.4 pg (ref 26.0–34.0)
MCHC: 32.1 g/dL (ref 30.0–36.0)
MCV: 85.6 fL (ref 80.0–100.0)
MONO ABS: 0.9 10*3/uL (ref 0.1–1.0)
Monocytes Relative: 8 %
NEUTROS ABS: 8.5 10*3/uL — AB (ref 1.7–7.7)
NRBC: 0 % (ref 0.0–0.2)
Neutrophils Relative %: 79 %
Platelets: 201 10*3/uL (ref 150–400)
RBC: 4.92 MIL/uL (ref 3.87–5.11)
RDW: 13.7 % (ref 11.5–15.5)
WBC: 10.8 10*3/uL — AB (ref 4.0–10.5)

## 2017-11-04 LAB — LACTIC ACID, PLASMA
Lactic Acid, Venous: 1.2 mmol/L (ref 0.5–1.9)
Lactic Acid, Venous: 1.4 mmol/L (ref 0.5–1.9)

## 2017-11-04 LAB — TROPONIN I: Troponin I: <0.03 ng/mL (ref <0.03)

## 2017-11-04 LAB — LIPASE, BLOOD: LIPASE: 29 U/L (ref 11–51)

## 2017-11-04 LAB — PHOSPHORUS: Phosphorus: 3.5 mg/dL (ref 2.5–4.6)

## 2017-11-04 LAB — MAGNESIUM: Magnesium: 1.9 mg/dL (ref 1.7–2.4)

## 2017-11-04 LAB — POC OCCULT BLOOD, ED: FECAL OCCULT BLD: POSITIVE — AB

## 2017-11-04 MED ORDER — LEVOTHYROXINE SODIUM 75 MCG PO TABS
75.0000 ug | ORAL_TABLET | Freq: Every day | ORAL | Status: DC
  Administered 2017-11-05 – 2017-11-07 (×2): 75 ug via ORAL
  Filled 2017-11-04: qty 2
  Filled 2017-11-04: qty 1

## 2017-11-04 MED ORDER — ACETAMINOPHEN 650 MG RE SUPP: 650.0000 mg | Freq: Four times a day (QID) | RECTAL | Status: DC | PRN

## 2017-11-04 MED ORDER — POTASSIUM CHLORIDE 10 MEQ/100ML IV SOLN: 10.0000 meq | INTRAVENOUS | Status: DC

## 2017-11-04 MED ORDER — BISACODYL 5 MG PO TBEC
10.0000 mg | DELAYED_RELEASE_TABLET | Freq: Two times a day (BID) | ORAL | Status: DC | PRN
  Administered 2017-11-07: 10 mg via ORAL
  Filled 2017-11-04: qty 2

## 2017-11-04 MED ORDER — POTASSIUM CHLORIDE IN NACL 20-0.45 MEQ/L-% IV SOLN
INTRAVENOUS | Status: DC
  Administered 2017-11-05: via INTRAVENOUS
  Filled 2017-11-04 (×2): qty 1000

## 2017-11-04 MED ORDER — SODIUM CHLORIDE 0.9 % IV SOLN
INTRAVENOUS | Status: DC
  Administered 2017-11-04: 21:00:00 via INTRAVENOUS

## 2017-11-04 MED ORDER — LIFITEGRAST 5 % OP SOLN: 1.0000 [drp] | Freq: Two times a day (BID) | OPHTHALMIC | Status: DC

## 2017-11-04 MED ORDER — AMIODARONE HCL 200 MG PO TABS
200.0000 mg | ORAL_TABLET | Freq: Every day | ORAL | Status: DC
  Administered 2017-11-05 – 2017-11-07 (×2): 200 mg via ORAL
  Filled 2017-11-04 (×2): qty 1

## 2017-11-04 MED ORDER — ONDANSETRON HCL 4 MG PO TABS: 4.0000 mg | ORAL_TABLET | Freq: Four times a day (QID) | ORAL | Status: DC | PRN

## 2017-11-04 MED ORDER — CARBIDOPA-LEVODOPA ER 25-100 MG PO TBCR
2.0000 | EXTENDED_RELEASE_TABLET | Freq: Every day | ORAL | Status: DC
  Administered 2017-11-05 – 2017-11-06 (×3): 2 via ORAL
  Filled 2017-11-04 (×2): qty 2

## 2017-11-04 MED ORDER — POTASSIUM CHLORIDE 10 MEQ/100ML IV SOLN
10.0000 meq | Freq: Once | INTRAVENOUS | Status: AC
  Administered 2017-11-05: 10 meq via INTRAVENOUS
  Filled 2017-11-04: qty 100

## 2017-11-04 MED ORDER — ROSUVASTATIN CALCIUM 10 MG PO TABS
10.0000 mg | ORAL_TABLET | Freq: Every day | ORAL | Status: DC
  Administered 2017-11-04 – 2017-11-06 (×3): 10 mg via ORAL
  Filled 2017-11-04 (×3): qty 1

## 2017-11-04 MED ORDER — MAGNESIUM SULFATE 2 GM/50ML IV SOLN
2.0000 g | Freq: Once | INTRAVENOUS | Status: AC
  Administered 2017-11-04: 2 g via INTRAVENOUS
  Filled 2017-11-04: qty 50

## 2017-11-04 MED ORDER — SENNOSIDES-DOCUSATE SODIUM 8.6-50 MG PO TABS
1.0000 | ORAL_TABLET | Freq: Every day | ORAL | Status: DC
  Administered 2017-11-04 – 2017-11-06 (×3): 1 via ORAL
  Filled 2017-11-04 (×3): qty 1

## 2017-11-04 MED ORDER — CARBIDOPA-LEVODOPA ER 50-200 MG PO TBCR
1.0000 | EXTENDED_RELEASE_TABLET | Freq: Every day | ORAL | Status: DC
  Filled 2017-11-04 (×3): qty 1

## 2017-11-04 MED ORDER — IOHEXOL 300 MG/ML  SOLN
100.0000 mL | Freq: Once | INTRAMUSCULAR | Status: AC | PRN
  Administered 2017-11-04: 100 mL via INTRAVENOUS

## 2017-11-04 MED ORDER — POTASSIUM CHLORIDE 10 MEQ/100ML IV SOLN
10.0000 meq | Freq: Once | INTRAVENOUS | Status: AC
  Administered 2017-11-04: 10 meq via INTRAVENOUS
  Filled 2017-11-04: qty 100

## 2017-11-04 MED ORDER — FAMOTIDINE IN NACL 20-0.9 MG/50ML-% IV SOLN
20.0000 mg | Freq: Two times a day (BID) | INTRAVENOUS | Status: DC
  Administered 2017-11-05 – 2017-11-07 (×6): 20 mg via INTRAVENOUS
  Filled 2017-11-04 (×6): qty 50

## 2017-11-04 MED ORDER — ONDANSETRON HCL 4 MG/2ML IJ SOLN
4.0000 mg | Freq: Four times a day (QID) | INTRAMUSCULAR | Status: DC | PRN
  Administered 2017-11-07: 4 mg via INTRAVENOUS
  Filled 2017-11-04: qty 2

## 2017-11-04 MED ORDER — CARBIDOPA-LEVODOPA 25-100 MG PO TABS
1.0000 | ORAL_TABLET | Freq: Three times a day (TID) | ORAL | Status: DC
  Administered 2017-11-05 – 2017-11-07 (×6): 1 via ORAL
  Filled 2017-11-04 (×9): qty 1

## 2017-11-04 MED ORDER — PANTOPRAZOLE SODIUM 40 MG IV SOLR
40.0000 mg | Freq: Once | INTRAVENOUS | Status: AC
  Administered 2017-11-04: 40 mg via INTRAVENOUS
  Filled 2017-11-04: qty 40

## 2017-11-04 MED ORDER — ACETAMINOPHEN 325 MG PO TABS: 650.0000 mg | ORAL_TABLET | Freq: Four times a day (QID) | ORAL | Status: DC | PRN

## 2017-11-04 NOTE — ED Triage Notes (Signed)
Pt c/o being impacted X2 days, has used suppositories, dulcolax without relief. Last normal BM was 2 days ago, has had small liquid stools after suppository use. States stool is black, denies Iron use.

## 2017-11-04 NOTE — H&P (Signed)
History and Physical    Abigail Taylor YHC:[id number] DOB: 08-20-1931 DOA: 11/04/2017  PCP: [name], MD   Patient coming from: Home.  I have personally briefly reviewed patient's old medical records in [place]  Chief Complaint: Constipation.  HPI: Abigail Taylor is a 84 y.o. female with medical history significant of angina, aortic insufficiency, paroxysmal atrial fibrillation on apixaban, bradycardia tachycardia syndrome, history of dual-chamber Medtronic pacemaker placement, chronic back pain, constipation, gait instability, Parkinson's disease, hyperlipidemia, hypothyroidism who is brought to the emergency department due to severe constipation for the past 2 days which has not responded to Dulcolax tablets and suppositories.  She said that she had her last BM 2 days ago, but since then she had only had a small loose stool BM after using a suppository.  She complains of frequent melena for the past week or 2.  She thought that it was because she took a teaspoon of Pepto-Bismol on Monday, but she only took that dose, but she has had multiple melanotic stools that cannot be attributed to this.  She mentions that prior to having melena, she has been having epigastric abdominal pain for over a month.exacerbates with food intake.  She denies emesis, but states she often feels nauseated, particularly after food intake.  She denies hematochezia.  She denies fever, chills, sore throat, wheezing, dyspnea, chest pain, palpitations, diaphoresis, PND, orthopnea or recent pitting edema of the lower extremities.  No dysuria, frequency or hematuria.  No polyuria, polydipsia, polyphagia or blurred vision.  ED Course: Initial vital signs temperature 98.1 F, pulse 62, respirations 18, blood pressure 148/50 mmHg and O2 sat 100% on room air.  The patient received 40 mg of pantoprazole IV in the ED.  Her urinalysis showed small hemoglobinuria, 5 mg/dL of ketones and trace leukocyte esterase.   Microscopic shows 6-10 WBCs per hpf and no bacteria.  CBC has a white count of 10.8 with 79% neutrophils, 11% lymphocytes and 8% monocytes.  Hemoglobin was 13.5 g/dL and platelets 201.  Lactic acid was normal x2.  Sodium is 139, potassium 3.4, chloride 103 and CO2 29 mmol/L.  BUN was 18, creatinine 1.04, glucose 109, magnesium 1.9 and phosphorus 3.5 mg/dL.  Lipase 29 units/L.  Troponin was normal.  Fecal occult blood was positive.  Imaging: Her chest radiograph does not show any acute cardiopulmonary process.  CT abdomen/pelvis shows diverticulosis, there is stool burden but not associate, aortic atherosclerosis among other findings, but no acute intra-abdominal or intrapelvic pathology.  Please see images and full radiology report for further detail.  Review of Systems: As per HPI otherwise 10 point review of systems negative.   Past Medical History:  Diagnosis Date  . Angina at rest, with the tachycardia 07/05/2011  . Aortic insufficiency 07/05/2011  . Atrial fibrillation ([place])   . Brady-tachy syndrome ([place])    Dual chamber Medtronic pacemaker Adapta  . Chronic back pain   . Constipation 12/27/2011  . Gait instability   . Hyperlipidemia   . Hypothyroidism   . Normal cardiac stress test 2013  . OA (osteoarthritis)   . Pacemaker   . Parkinson's disease ([place])   . Tremor     Past Surgical History:  Procedure Laterality Date  . ABDOMINAL HYSTERECTOMY    . CARDIAC CATHETERIZATION  05/26/1999   normal  . CARDIAC CATHETERIZATION  2001   normal coronary arteries  . cataract    . COLONOSCOPY N/A 11/15/2012   Procedure: COLONOSCOPY;  Surgeon: [name], MD;  Location: [facility];  Service: Endoscopy;  Laterality: N/A;  1030  . HIP ARTHROPLASTY Left 06/24/2016   Procedure: ARTHROPLASTY BIPOLAR HIP (HEMIARTHROPLASTY);  Surgeon: Altamese Kenosha, MD;  Location: [facility];  Service: Orthopedics;  Laterality: Left;  . INSERT / REPLACE / REMOVE PACEMAKER    . NM MYOVIEW LTD  07/19/2011   normal    . PERMANENT PACEMAKER INSERTION  07/05/2011   Medtronic Adapta dual chamber  . PERMANENT PACEMAKER INSERTION N/A 07/05/2011   Procedure: PERMANENT PACEMAKER INSERTION;  Surgeon: [name], MD;  Location: [facility];  Service: Cardiovascular;  Laterality: N/A;     reports that she has never smoked. She has never used smokeless tobacco. She reports that she does not drink alcohol or use drugs.  No Known Allergies  Family History  Problem Relation Age of Onset  . Colon cancer Mother 25  . Heart attack Mother   . Cancer Father   . Suicidality Son    Prior to Admission medications   Medication Sig Start Date End Date Taking? Authorizing Provider  amiodarone (PACERONE) 200 MG tablet Take 200 mg daily with largest meal Patient taking differently: Take 200 mg by mouth daily. Take 200 mg daily with largest meal 10/24/17  Yes Barrett, Evelene Croon, PA-C  bisacodyl (BISACODYL) 5 MG EC tablet Take 10 mg by mouth 2 (two) times daily as needed for moderate constipation.   Yes [provider]  bisacodyl (DULCOLAX) 10 MG suppository Place 10 mg rectally 3 (three) times daily as needed for moderate constipation.    Yes [provider]  carbidopa-levodopa (SINEMET CR) 50-200 MG tablet TAKE 1 TABLET BY MOUTH AT BEDTIME 07/18/17  Yes Tat, Rebecca S, DO  carbidopa-levodopa (SINEMET IR) 25-100 MG tablet Take 1 tablet by mouth 3 (three) times daily with meals. 10/04/17  Yes Emokpae, Courage, MD  ELIQUIS 2.5 MG TABS tablet TAKE 1 TABLET BY MOUTH TWICE A DAY Patient taking differently: Take 2.5 mg by mouth 2 (two) times daily.  10/31/17  Yes Croitoru, Mihai, MD  levothyroxine (SYNTHROID, LEVOTHROID) 75 MCG tablet Take 75 mcg by mouth daily before breakfast.   Yes [provider]  rosuvastatin (CRESTOR) 10 MG tablet Take 10 mg by mouth at bedtime. Reported on 07/09/2015   Yes [provider]  senna-docusate (SENOKOT-S) 8.6-50 MG tablet Take 1 tablet by mouth at bedtime as needed  for mild constipation. 06/27/16  Yes Alphonzo Grieve, MD  XIIDRA 5 % SOLN Place 1 drop into the left eye 2 (two) times daily.  10/04/16  Yes [provider]    Physical Exam: Vitals:   11/04/17 1911  BP: (!) 148/50  Pulse: 62  Resp: 18  Temp: 98.1 F (36.7 C)  TempSrc: Temporal  SpO2: 100%  Weight: 54.4 kg  Height: 5\' 6"  (1.676 m)    Constitutional: Frail, elderly female, but in NAD, calm, comfortable Eyes: PERRL, lids and conjunctivae normal ENMT: Mucous membranes are moist. Posterior pharynx clear of any exudate or lesions. Neck: normal, supple, no masses, no thyromegaly Respiratory: clear to auscultation bilaterally, no wheezing, no crackles. Normal respiratory effort. No accessory muscle use.  Cardiovascular: Regular rate and rhythm, no murmurs / rubs / gallops. No extremity edema. 2+ pedal pulses. No carotid bruits.  Abdomen: Soft, mild epigastric tenderness, no masses palpated. No hepatosplenomegaly. Bowel sounds positive.  Musculoskeletal: no clubbing / cyanosis.  Good ROM, no contractures. Normal muscle tone.  Skin: no rashes, lesions, ulcers. No induration Neurologic: CN 2-12 grossly intact. Sensation  intact, DTR normal. Strength 5/5 in all 4.  Psychiatric: Normal judgment and insight. Alert and oriented x 3. Normal mood.   Labs on Admission: I have personally reviewed following labs and imaging studies  CBC: Recent Labs  Lab 11/04/17 1952  WBC 10.8*  NEUTROABS 8.5*  HGB 13.5  HCT 42.1  MCV 85.6  PLT 259   Basic Metabolic Panel: Recent Labs  Lab 11/04/17 1952  NA 139  K 3.4*  CL 103  CO2 29  GLUCOSE 109*  BUN 18  CREATININE 1.04*  CALCIUM 8.7*   GFR: Estimated Creatinine Clearance: 34.6 mL/min (A) (by C-G formula based on SCr of 1.04 mg/dL (H)). Liver Function Tests: Recent Labs  Lab 11/04/17 1952  AST 17  ALT 13  ALKPHOS 80  BILITOT 0.8  PROT 6.6  ALBUMIN 3.8   Recent Labs  Lab 11/04/17 1952  LIPASE 29   No results for  input(s): AMMONIA in the last 168 hours. Coagulation Profile: No results for input(s): INR, PROTIME in the last 168 hours. Cardiac Enzymes: Recent Labs  Lab 11/04/17 1952  TROPONINI <0.03   BNP (last 3 results) No results for input(s): PROBNP in the last 8760 hours. HbA1C: No results for input(s): HGBA1C in the last 72 hours. CBG: No results for input(s): GLUCAP in the last 168 hours. Lipid Profile: No results for input(s): CHOL, HDL, LDLCALC, TRIG, CHOLHDL, LDLDIRECT in the last 72 hours. Thyroid Function Tests: No results for input(s): TSH, T4TOTAL, FREET4, T3FREE, THYROIDAB in the last 72 hours. Anemia Panel: No results for input(s): VITAMINB12, FOLATE, FERRITIN, TIBC, IRON, RETICCTPCT in the last 72 hours. Urine analysis:    Component Value Date/Time   COLORURINE YELLOW 11/04/2017 2030   APPEARANCEUR CLEAR 11/04/2017 2030   LABSPEC 1.015 11/04/2017 2030   PHURINE 5.0 11/04/2017 2030   GLUCOSEU NEGATIVE 11/04/2017 2030   HGBUR SMALL (A) 11/04/2017 2030   [place] NEGATIVE 11/04/2017 2030   KETONESUR 5 (A) 11/04/2017 2030   PROTEINUR NEGATIVE 11/04/2017 2030   UROBILINOGEN 0.2 05/07/2014 2101   NITRITE NEGATIVE 11/04/2017 2030   LEUKOCYTESUR TRACE (A) 11/04/2017 2030    Radiological Exams on Admission: Dg Chest 2 View  Result Date: 11/04/2017 CLINICAL DATA:  Abdominal pain. EXAM: CHEST - 2 VIEW COMPARISON:  Chest radiograph 09/29/2017 FINDINGS: Multi lead pacer apparatus overlies the left hemithorax. Stable cardiomegaly. Aortic atherosclerosis. No consolidative pulmonary opacities. No pleural effusion or pneumothorax. Thoracic spine degenerative changes. IMPRESSION: No acute cardiopulmonary process. Electronically Signed   By: Lovey Newcomer M.D.   On: 11/04/2017 21:52   Ct Abdomen Pelvis W Contrast  Result Date: 11/04/2017 CLINICAL DATA:  84 year old female with history of constipation. Black stools. EXAM: CT ABDOMEN AND PELVIS WITH CONTRAST TECHNIQUE: Multidetector  CT imaging of the abdomen and pelvis was performed using the standard protocol following bolus administration of intravenous contrast. CONTRAST:  143mL OMNIPAQUE IOHEXOL 300 MG/ML  SOLN COMPARISON:  No priors. FINDINGS: Lower chest: Mild scarring in the lung bases. Pacemaker leads in the right atrium and right ventricular apex. Aortic atherosclerosis. Hepatobiliary: No suspicious cystic or solid hepatic lesions. No intra or extrahepatic biliary ductal dilatation. Gallbladder is normal in appearance. Pancreas: No pancreatic mass. No pancreatic ductal dilatation. No pancreatic or peripancreatic fluid or inflammatory changes. Spleen: Unremarkable. Adrenals/Urinary Tract: Bilateral kidneys and bilateral adrenal glands are normal in appearance. No hydroureteronephrosis. Urinary bladder is partially obscured by beam hardening artifact from the left hip arthroplasty, but otherwise unremarkable in appearance. Stomach/Bowel: The appearance of the stomach is  normal. No pathologic dilatation of small bowel or colon. Numerous colonic diverticulae are noted, without surrounding inflammatory changes to suggest an acute diverticulitis at this time. Stool burden does not appear excessive. The appendix is not confidently identified and may be surgically absent. Regardless, there are no inflammatory changes noted adjacent to the cecum to suggest the presence of an acute appendicitis at this time. Vascular/Lymphatic: Aortic atherosclerosis, without evidence of aneurysm or dissection in the abdominal or pelvic vasculature. No lymphadenopathy noted in the abdomen or pelvis. Reproductive: Status post hysterectomy. Ovaries are not confidently identified may be surgically absent or atrophic. Other: No significant volume of ascites.  No pneumoperitoneum. Musculoskeletal: Status post left hip hemiarthroplasty. There are no aggressive appearing lytic or blastic lesions noted in the visualized portions of the skeleton. IMPRESSION: 1. No acute  findings are noted in the abdomen or pelvis. 2. Colonic diverticulosis without evidence of acute diverticulitis at this time. 3. Overall, stool burden does not appear excessive. 4. Aortic atherosclerosis. 5. Additional incidental findings, as above. Electronically Signed   By: Vinnie Langton M.D.   On: 11/04/2017 21:38    EKG: Independently reviewed.  Vent. rate 66 BPM PR interval * ms QRS duration 145 ms QT/QTc 487/511 ms P-R-T axes * 78 32 Normal sinus rhythm Right bundle branch block Inferior infarct, old Anterolateral infarct, age indeterminate Baseline wander Artifact  Assessment/Plan Principal Problem:   Melena Observation/telemetry. Keep n.p.o. Hold apixaban. Famotidine 20 mg IVPB every 12 hours. Monitor hematocrit and hemoglobin. GI consult in the morning.  Active Problems:   Dyslipidemia Continue Crestor 10 mg p.o. daily at bedtime.    Hypothyroidism Continue levothyroxine 75 mcg p.o. daily. TSH 0.799 IU/mL on September 29, 2017. Follow-up TSH as needed.    Paroxysmal atrial fibrillation (HCC) Hold Eliquis. Continue amiodarone 200 mg p.o. daily.    HTN (hypertension) Currently not on antihypertensives. Monitor blood pressure.    PD (Parkinson's disease) (HCC) Continue Sinemet IR 25-100 mg p.o. 3 times daily. Continue Sinemet ER 50-200 mg p.o. nightly.    Hypokalemia Replacing. Magnesium being supplemented.   DVT prophylaxis: On Eliquis. Code Status: Full code. Family Communication:  Disposition Plan: Observation for gentle IV hydration, GI evaluation in a.m. Consults called: Routine gastroenterology consult. Admission status: Observation/Telemetry.   Reubin Milan MD [facility] Pager [phone].  If 7PM-7AM, please contact night-coverage [email or web address] Password [facility]  11/04/2017, 10:23 PM

## 2017-11-04 NOTE — ED Provider Notes (Signed)
[facility] EMERGENCY DEPARTMENT Provider Note   CSN: [id number] Arrival date & time: 11/04/17  1903     History   Chief Complaint Chief Complaint  Patient presents with  . Constipation  . Abdominal Pain  . Melena    HPI Abigail Taylor is a 84 y.o. female.  HPI  Pt was seen at 1925. Per pt and her family, c/o gradual onset and worsening of persistent upper abd "pain" for the past 1 month, worse today. Pt states her last BM was 2 days ago, so she took a laxative/suppository. Pt states she then "had small liquid bowel movements all day today." Describes the stools as "black." Pt states she takes eliquis. Denies back pain, no rectal pain, no CP/SOB, no cough, no fevers, no red blood in stools.    Past Medical History:  Diagnosis Date  . Angina at rest, with the tachycardia 07/05/2011  . Aortic insufficiency 07/05/2011  . Atrial fibrillation ([place])   . Brady-tachy syndrome ([place])    Dual chamber Medtronic pacemaker Adapta  . Chronic back pain   . Constipation 12/27/2011  . Gait instability   . Hyperlipidemia   . Hypothyroidism   . Normal cardiac stress test 2013  . OA (osteoarthritis)   . Pacemaker   . Parkinson's disease ([place])   . Tremor     Patient Active Problem List   Diagnosis Date Noted  . Atrial fibrillation with RVR ([place]) 09/29/2017  . RBD (REM behavioral disorder) 01/10/2017  . Neurogenic orthostatic hypotension ([place]) 01/10/2017  . Tachy-brady syndrome ([place]) 06/23/2016  . Closed subcapital fracture of left femur ([place]) 06/23/2016  . Orthostatic hypotension due to Parkinson's disease ([place]) 06/23/2016  . PD (Parkinson's disease) ([place]) 04/21/2016  . Hypokalemia 04/21/2016  . Normal coronary arteries 2001 05/08/2014  . Pacemaker 08/10/2012  . Aortic insufficiency 08/10/2012  . Constipation 12/27/2011  . Family hx of colon cancer 12/27/2011  . HTN (hypertension) 07/06/2011  . Hypothyroidism 07/05/2011  . Paroxysmal atrial fibrillation ([place]) 07/05/2011  .  Sinus pause, 7 seconds post conversion, S/P MDT pacemaker 07/05/11 07/05/2011  . Dyslipidemia 07/05/2011  . Angina at rest, with rapid AF 07/05/2011    Past Surgical History:  Procedure Laterality Date  . ABDOMINAL HYSTERECTOMY    . CARDIAC CATHETERIZATION  05/26/1999   normal  . CARDIAC CATHETERIZATION  2001   normal coronary arteries  . cataract    . COLONOSCOPY N/A 11/15/2012   Procedure: COLONOSCOPY;  Surgeon: [name], MD;  [facility];  Service: Endoscopy;  Laterality: N/A;  1030  . HIP ARTHROPLASTY Left 06/24/2016   Procedure: ARTHROPLASTY BIPOLAR HIP (HEMIARTHROPLASTY);  Surgeon: Altamese Shoreview, MD;  Location: [place];  Service: Orthopedics;  Laterality: Left;  . INSERT / REPLACE / REMOVE PACEMAKER    . NM MYOVIEW LTD  07/19/2011   normal  . PERMANENT PACEMAKER INSERTION  07/05/2011   Medtronic Adapta dual chamber  . PERMANENT PACEMAKER INSERTION N/A 07/05/2011   Procedure: PERMANENT PACEMAKER INSERTION;  Surgeon: [name], MD;  Location: [place] CATH LAB;  Service: Cardiovascular;  Laterality: N/A;     OB History   None      Home Medications    Prior to Admission medications   Medication Sig Start Date End Date Taking? Authorizing Provider  amiodarone (PACERONE) 200 MG tablet Take 200 mg daily with largest meal 10/24/17   Barrett, Evelene Croon, PA-C  bisacodyl (DULCOLAX) 10 MG suppository Place 10 mg rectally as needed for moderate constipation.  [provider]  carbidopa-levodopa (SINEMET CR) 50-200 MG tablet TAKE 1 TABLET BY MOUTH AT BEDTIME 07/18/17   Tat, Rebecca S, DO  carbidopa-levodopa (SINEMET IR) 25-100 MG tablet Take 1 tablet by mouth 3 (three) times daily with meals. 10/04/17   Emokpae, Courage, MD  ELIQUIS 2.5 MG TABS tablet TAKE 1 TABLET BY MOUTH TWICE A DAY Patient taking differently: Take 2.5 mg by mouth 2 (two) times daily.  10/31/17   Croitoru, Mihai, MD  fludrocortisone (FLORINEF) 0.1 MG tablet Take 1 tablet (100 mcg total) by mouth  daily. Patient not taking: Reported on 11/02/2017 04/13/17   Croitoru, Mihai, MD  levothyroxine (SYNTHROID, LEVOTHROID) 75 MCG tablet Take 75 mcg by mouth daily before breakfast.    [provider]  rosuvastatin (CRESTOR) 10 MG tablet Take 10 mg by mouth at bedtime. Reported on 07/09/2015    [provider]  senna-docusate (SENOKOT-S) 8.6-50 MG tablet Take 1 tablet by mouth at bedtime as needed for mild constipation. 06/27/16   Alphonzo Grieve, MD  XIIDRA 5 % SOLN Place 1 drop into the left eye 2 (two) times daily.  10/04/16   [provider]    Family History Family History  Problem Relation Age of Onset  . Colon cancer Mother 56  . Heart attack Mother   . Cancer Father   . Suicidality Son     Social History Social History   Tobacco Use  . Smoking status: Never Smoker  . Smokeless tobacco: Never Used  Substance Use Topics  . Alcohol use: No    Alcohol/week: 0.0 standard drinks  . Drug use: No     Allergies   Patient has no known allergies.   Review of Systems Review of Systems ROS: Statement: All systems negative except as marked or noted in the HPI; Constitutional: Negative for fever and chills. ; ; Eyes: Negative for eye pain, redness and discharge. ; ; ENMT: Negative for ear pain, hoarseness, nasal congestion, sinus pressure and sore throat. ; ; Cardiovascular: Negative for chest pain, palpitations, diaphoresis, dyspnea and peripheral edema. ; ; Respiratory: Negative for cough, wheezing and stridor. ; ; Gastrointestinal: +abd pain, black stools, constipation. Negative for nausea, vomiting, diarrhea, blood in stool, hematemesis, jaundice and rectal bleeding. . ; ; Genitourinary: Negative for dysuria, flank pain and hematuria. ; ; Musculoskeletal: Negative for back pain and neck pain. Negative for swelling and trauma.; ; Skin: Negative for pruritus, rash, abrasions, blisters, bruising and skin lesion.; ; Neuro: Negative for headache, lightheadedness and  neck stiffness. Negative for weakness, altered level of consciousness, altered mental status, extremity weakness, paresthesias, involuntary movement, seizure and syncope.       Physical Exam Updated Vital Signs BP (!) 148/50 (BP Location: Right Arm)   Pulse 62   Temp 98.1 F (36.7 C) (Temporal)   Resp 18   Ht 5\' 6"  (1.676 m)   Wt 54.4 kg   SpO2 100%   BMI 19.37 kg/m   Physical Exam 1930: Physical examination:  Nursing notes reviewed; Vital signs and O2 SAT reviewed;  Constitutional: Well developed, Well nourished, Well hydrated, In no acute distress; Head:  Normocephalic, atraumatic; Eyes: EOMI, PERRL, No scleral icterus; ENMT: Mouth and pharynx normal, Mucous membranes moist; Neck: Supple, Full range of motion, No lymphadenopathy; Cardiovascular: Regular rate and rhythm, No gallop; Respiratory: Breath sounds clear & equal bilaterally, No wheezes.  Speaking full sentences with ease, Normal respiratory effort/excursion; Chest: Nontender, Movement normal; Abdomen: Soft, +mid-epigastric tenderness to palp. No rebound or guarding.  Nondistended, Normal bowel sounds. Rectal exam performed w/permission of pt and ED RN chaperone present.  Anal tone normal.  Non-tender, soft black stool in rectal vault, heme positive.  No fissures, no external hemorrhoids, no palp masses.; Genitourinary: No CVA tenderness; Extremities: Peripheral pulses normal, No tenderness, No edema, No calf edema or asymmetry.; Neuro: AA&Ox3, Major CN grossly intact.  Speech clear. No gross focal motor deficits in extremities.; Skin: Color normal, Warm, Dry.   ED Treatments / Results  Labs (all labs ordered are listed, but only abnormal results are displayed)   EKG None  Radiology   Procedures Procedures (including critical care time)  Medications Ordered in ED Medications - No data to display   Initial Impression / Assessment and Plan / ED Course  I have reviewed the triage vital signs and the nursing  notes.  Pertinent labs & imaging results that were available during my care of the patient were reviewed by me and considered in my medical decision making (see chart for details).  MDM Reviewed: previous chart, nursing note and vitals Reviewed previous: labs and ECG Interpretation: labs, ECG, x-ray and CT scan    Results for orders placed or performed during the hospital encounter of 11/04/17  Comprehensive metabolic panel  Result Value Ref Range   Sodium 139 135 - 145 mmol/L   Potassium 3.4 (L) 3.5 - 5.1 mmol/L   Chloride 103 98 - 111 mmol/L   CO2 29 22 - 32 mmol/L   Glucose, Bld 109 (H) 70 - 99 mg/dL   BUN 18 8 - 23 mg/dL   Creatinine, Ser 1.04 (H) 0.44 - 1.00 mg/dL   Calcium 8.7 (L) 8.9 - 10.3 mg/dL   Total Protein 6.6 6.5 - 8.1 g/dL   Albumin 3.8 3.5 - 5.0 g/dL   AST 17 15 - 41 U/L   ALT 13 0 - 44 U/L   Alkaline Phosphatase 80 38 - 126 U/L   Total Bilirubin 0.8 0.3 - 1.2 mg/dL   GFR calc non Af Amer 48 (L) >60 mL/min   GFR calc Af Amer 56 (L) >60 mL/min   Anion gap 7 5 - 15  Lipase, blood  Result Value Ref Range   Lipase 29 11 - 51 U/L  Troponin I  Result Value Ref Range   Troponin I <0.03 <0.03 ng/mL  Lactic acid, plasma  Result Value Ref Range   Lactic Acid, Venous 1.2 0.5 - 1.9 mmol/L  CBC with Differential  Result Value Ref Range   WBC 10.8 (H) 4.0 - 10.5 K/uL   RBC 4.92 3.87 - 5.11 MIL/uL   Hemoglobin 13.5 12.0 - 15.0 g/dL   HCT 42.1 36.0 - 46.0 %   MCV 85.6 80.0 - 100.0 fL   MCH 27.4 26.0 - 34.0 pg   MCHC 32.1 30.0 - 36.0 g/dL   RDW 13.7 11.5 - 15.5 %   Platelets 201 150 - 400 K/uL   nRBC 0.0 0.0 - 0.2 %   Neutrophils Relative % 79 %   Neutro Abs 8.5 (H) 1.7 - 7.7 K/uL   Lymphocytes Relative 11 %   Lymphs Abs 1.2 0.7 - 4.0 K/uL   Monocytes Relative 8 %   Monocytes Absolute 0.9 0.1 - 1.0 K/uL   Eosinophils Relative 2 %   Eosinophils Absolute 0.2 0.0 - 0.5 K/uL   Basophils Relative 0 %   Basophils Absolute 0.0 0.0 - 0.1 K/uL   Immature  Granulocytes 0 %   Abs Immature Granulocytes 0.04 0.00 -  0.07 K/uL  Urinalysis, Routine w reflex microscopic  Result Value Ref Range   Color, Urine YELLOW YELLOW   APPearance CLEAR CLEAR   Specific Gravity, Urine 1.015 1.005 - 1.030   pH 5.0 5.0 - 8.0   Glucose, UA NEGATIVE NEGATIVE mg/dL   Hgb urine dipstick SMALL (A) NEGATIVE   Bilirubin Urine NEGATIVE NEGATIVE   Ketones, ur 5 (A) NEGATIVE mg/dL   Protein, ur NEGATIVE NEGATIVE mg/dL   Nitrite NEGATIVE NEGATIVE   Leukocytes, UA TRACE (A) NEGATIVE   RBC / HPF 0-5 0 - 5 RBC/hpf   WBC, UA 6-10 0 - 5 WBC/hpf   Bacteria, UA NONE SEEN NONE SEEN   Squamous Epithelial / LPF 0-5 0 - 5   Mucus PRESENT   POC occult blood, ED  Result Value Ref Range   Fecal Occult Bld POSITIVE (A) NEGATIVE    Ct Abdomen Pelvis W Contrast Result Date: 11/04/2017 CLINICAL DATA:  84 year old female with history of constipation. Black stools. EXAM: CT ABDOMEN AND PELVIS WITH CONTRAST TECHNIQUE: Multidetector CT imaging of the abdomen and pelvis was performed using the standard protocol following bolus administration of intravenous contrast. CONTRAST:  173mL OMNIPAQUE IOHEXOL 300 MG/ML  SOLN COMPARISON:  No priors. FINDINGS: Lower chest: Mild scarring in the lung bases. Pacemaker leads in the right atrium and right ventricular apex. Aortic atherosclerosis. Hepatobiliary: No suspicious cystic or solid hepatic lesions. No intra or extrahepatic biliary ductal dilatation. Gallbladder is normal in appearance. Pancreas: No pancreatic mass. No pancreatic ductal dilatation. No pancreatic or peripancreatic fluid or inflammatory changes. Spleen: Unremarkable. Adrenals/Urinary Tract: Bilateral kidneys and bilateral adrenal glands are normal in appearance. No hydroureteronephrosis. Urinary bladder is partially obscured by beam hardening artifact from the left hip arthroplasty, but otherwise unremarkable in appearance. Stomach/Bowel: The appearance of the stomach is normal. No  pathologic dilatation of small bowel or colon. Numerous colonic diverticulae are noted, without surrounding inflammatory changes to suggest an acute diverticulitis at this time. Stool burden does not appear excessive. The appendix is not confidently identified and may be surgically absent. Regardless, there are no inflammatory changes noted adjacent to the cecum to suggest the presence of an acute appendicitis at this time. Vascular/Lymphatic: Aortic atherosclerosis, without evidence of aneurysm or dissection in the abdominal or pelvic vasculature. No lymphadenopathy noted in the abdomen or pelvis. Reproductive: Status post hysterectomy. Ovaries are not confidently identified may be surgically absent or atrophic. Other: No significant volume of ascites.  No pneumoperitoneum. Musculoskeletal: Status post left hip hemiarthroplasty. There are no aggressive appearing lytic or blastic lesions noted in the visualized portions of the skeleton. IMPRESSION: 1. No acute findings are noted in the abdomen or pelvis. 2. Colonic diverticulosis without evidence of acute diverticulitis at this time. 3. Overall, stool burden does not appear excessive. 4. Aortic atherosclerosis. 5. Additional incidental findings, as above. Electronically Signed   By: Vinnie Langton M.D.   On: 11/04/2017 21:38    2205:  IV protonix and IVF started. Magnesium level ordered. H/H currently stable. Pt denies ASA, BC powders, NSAIDs use. Concern pt is taking eliquis with black/heme positive stools; will observation admit. Dx and testing d/w pt and family.  Questions answered.  Verb understanding, agreeable to admit.  T/C returned from [facility] Dr. Olevia Bowens, case discussed, including:  HPI, pertinent PM/SHx, VS/PE, dx testing, ED course and treatment:  Agreeable to admit.     Final Clinical Impressions(s) / ED Diagnoses   Final diagnoses:  None    ED Discharge Orders  None       Francine Graven, DO 11/08/17 3557

## 2017-11-05 DIAGNOSIS — I480 Paroxysmal atrial fibrillation: Secondary | ICD-10-CM | POA: Diagnosis not present

## 2017-11-05 DIAGNOSIS — K5790 Diverticulosis of intestine, part unspecified, without perforation or abscess without bleeding: Secondary | ICD-10-CM | POA: Diagnosis not present

## 2017-11-05 DIAGNOSIS — E785 Hyperlipidemia, unspecified: Secondary | ICD-10-CM | POA: Diagnosis not present

## 2017-11-05 DIAGNOSIS — G20 Parkinson's disease: Secondary | ICD-10-CM

## 2017-11-05 DIAGNOSIS — K921 Melena: Secondary | ICD-10-CM | POA: Diagnosis not present

## 2017-11-05 DIAGNOSIS — I10 Essential (primary) hypertension: Secondary | ICD-10-CM

## 2017-11-05 DIAGNOSIS — E876 Hypokalemia: Secondary | ICD-10-CM

## 2017-11-05 LAB — BASIC METABOLIC PANEL
Anion gap: 7 (ref 5–15)
BUN: 14 mg/dL (ref 8–23)
CHLORIDE: 105 mmol/L (ref 98–111)
CO2: 28 mmol/L (ref 22–32)
CREATININE: 0.86 mg/dL (ref 0.44–1.00)
Calcium: 7.8 mg/dL — ABNORMAL LOW (ref 8.9–10.3)
GFR calc Af Amer: >60 mL/min (ref >60)
GFR calc non Af Amer: >60 mL/min (ref >60)
Glucose, Bld: 102 mg/dL — ABNORMAL HIGH (ref 70–99)
Potassium: 3 mmol/L — ABNORMAL LOW (ref 3.5–5.1)
Sodium: 140 mmol/L (ref 135–145)

## 2017-11-05 LAB — CBC
HEMATOCRIT: 39.8 % (ref 36.0–46.0)
Hemoglobin: 12.5 g/dL (ref 12.0–15.0)
MCH: 27.5 pg (ref 26.0–34.0)
MCHC: 31.4 g/dL (ref 30.0–36.0)
MCV: 87.7 fL (ref 80.0–100.0)
Platelets: 176 10*3/uL (ref 150–400)
RBC: 4.54 MIL/uL (ref 3.87–5.11)
RDW: 13.8 % (ref 11.5–15.5)
WBC: 7.8 10*3/uL (ref 4.0–10.5)
nRBC: 0 % (ref 0.0–0.2)

## 2017-11-05 LAB — HEMOGLOBIN AND HEMATOCRIT, BLOOD
HEMATOCRIT: 39.3 % (ref 36.0–46.0)
Hemoglobin: 12.5 g/dL (ref 12.0–15.0)

## 2017-11-05 MED ORDER — PANTOPRAZOLE SODIUM 40 MG PO TBEC
40.0000 mg | DELAYED_RELEASE_TABLET | Freq: Every day | ORAL | Status: DC
  Administered 2017-11-05 – 2017-11-07 (×2): 40 mg via ORAL
  Filled 2017-11-05 (×2): qty 1

## 2017-11-05 MED ORDER — CYCLOSPORINE 0.05 % OP EMUL
1.0000 [drp] | Freq: Two times a day (BID) | OPHTHALMIC | Status: DC
  Administered 2017-11-05 – 2017-11-07 (×5): 1 [drp] via OPHTHALMIC
  Filled 2017-11-05 (×5): qty 1

## 2017-11-05 MED ORDER — POLYETHYLENE GLYCOL 3350 17 G PO PACK
17.0000 g | PACK | Freq: Every day | ORAL | Status: DC
  Administered 2017-11-05 – 2017-11-07 (×2): 17 g via ORAL
  Filled 2017-11-05 (×2): qty 1

## 2017-11-05 NOTE — Consult Note (Signed)
Referring Provider: No ref. provider found Primary Care Physician:  [name], MD Primary Gastroenterologist:  Dr. Laural Golden  Reason for Consultation:  84 year old lady with multiple co-morbidities including atrial fibrillation on chronic anticoagulation therapy (Eliquis);  Admitted to the hospital yesterday with new onset vague upper abdominal pain and black stools.  Has taken Pepto-Bismol recently - but says none in a week. Occult blood testing on stool not documented. No nausea or vomiting, odynophagia, dysphagia, early satiety, nausea or vomiting. Vague postprandial component. Endorses chronic constipation. Last dose of Elliquis reported to be night before last.   CT of the abdomen yesterday demonstrated diverticulosis; nothing acute. Gallbladder in situ. LFTs, lipase normal.  She was admitted for further evaluation. She has remained hemodynamically stable. Hemoglobin this morning 12.5/13.5 yesterday/13.6 one month ago.  No history of GI bleeding previously.  Patient states Dr. Laural Golden did an upper endoscopy many years ago - cannot recall the reason. High risk screening colonoscopy 2014-diverticulosis and hemorrhoids.    HPI:  Hemoccult status on Past Medical History:  Diagnosis Date  . Angina at rest, with the tachycardia 07/05/2011  . Aortic insufficiency 07/05/2011  . Atrial fibrillation ([place])   . Brady-tachy syndrome ([place])    Dual chamber Medtronic pacemaker Adapta  . Chronic back pain   . Constipation 12/27/2011  . Gait instability   . Hyperlipidemia   . Hypothyroidism   . Normal cardiac stress test 2013  . OA (osteoarthritis)   . Pacemaker   . Parkinson's disease ([place])   . Tremor     Past Surgical History:  Procedure Laterality Date  . ABDOMINAL HYSTERECTOMY    . CARDIAC CATHETERIZATION  05/26/1999   normal  . CARDIAC CATHETERIZATION  2001   normal coronary arteries  . cataract    . COLONOSCOPY N/A 11/15/2012   Procedure: COLONOSCOPY;  Surgeon: [name], MD;  [facility];  Service: Endoscopy;  Laterality: N/A;  1030  . HIP ARTHROPLASTY Left 06/24/2016   Procedure: ARTHROPLASTY BIPOLAR HIP (HEMIARTHROPLASTY);  Surgeon: Altamese , MD;  Location: [facility];  Service: Orthopedics;  Laterality: Left;  . INSERT / REPLACE / REMOVE PACEMAKER    . NM MYOVIEW LTD  07/19/2011   normal  . PERMANENT PACEMAKER INSERTION  07/05/2011   Medtronic Adapta dual chamber  . PERMANENT PACEMAKER INSERTION N/A 07/05/2011   Procedure: PERMANENT PACEMAKER INSERTION;  Surgeon: [name], MD;  Location: [facility] CATH LAB;  Service: Cardiovascular;  Laterality: N/A;    Prior to Admission medications   Medication Sig Start Date End Date Taking? Authorizing Provider  amiodarone (PACERONE) 200 MG tablet Take 200 mg daily with largest meal Patient taking differently: Take 200 mg by mouth daily. Take 200 mg daily with largest meal 10/24/17  Yes Barrett, Evelene Croon, PA-C  bisacodyl (BISACODYL) 5 MG EC tablet Take 10 mg by mouth 2 (two) times daily as needed for moderate constipation.   Yes [provider]  bisacodyl (DULCOLAX) 10 MG suppository Place 10 mg rectally 3 (three) times daily as needed for moderate constipation.    Yes [provider]  carbidopa-levodopa (SINEMET CR) 50-200 MG tablet TAKE 1 TABLET BY MOUTH AT BEDTIME 07/18/17  Yes Tat, Rebecca S, DO  carbidopa-levodopa (SINEMET IR) 25-100 MG tablet Take 1 tablet by mouth 3 (three) times daily with meals. 10/04/17  Yes Emokpae, Courage, MD  ELIQUIS 2.5 MG TABS tablet TAKE 1 TABLET BY MOUTH TWICE A DAY Patient taking differently: Take 2.5 mg by mouth 2 (two)  times daily.  10/31/17  Yes Croitoru, Mihai, MD  levothyroxine (SYNTHROID, LEVOTHROID) 75 MCG tablet Take 75 mcg by mouth daily before breakfast.   Yes [provider]  rosuvastatin (CRESTOR) 10 MG tablet Take 10 mg by mouth at bedtime. Reported on 07/09/2015   Yes [provider]  senna-docusate (SENOKOT-S) 8.6-50 MG  tablet Take 1 tablet by mouth at bedtime as needed for mild constipation. 06/27/16  Yes Alphonzo Grieve, MD  XIIDRA 5 % SOLN Place 1 drop into the left eye 2 (two) times daily.  10/04/16  Yes [provider]    Current Facility-Administered Medications  Medication Dose Route Frequency Provider Last Rate Last Dose  . acetaminophen (TYLENOL) tablet 650 mg  650 mg Oral Q6H PRN Reubin Milan, MD       Or  . acetaminophen (TYLENOL) suppository 650 mg  650 mg Rectal Q6H PRN Reubin Milan, MD      . amiodarone (PACERONE) tablet 200 mg  200 mg Oral Daily Reubin Milan, MD   200 mg at 11/05/17 1009  . bisacodyl (DULCOLAX) EC tablet 10 mg  10 mg Oral BID PRN Reubin Milan, MD      . carbidopa-levodopa (SINEMET IR) 25-100 MG per tablet immediate release 1 tablet  1 tablet Oral TID WC Reubin Milan, MD   1 tablet at 11/05/17 0802  . Carbidopa-Levodopa ER (SINEMET CR) 25-100 MG tablet controlled release 2 tablet  2 tablet Oral QHS Reubin Milan, MD   2 tablet at 11/05/17 0038  . cycloSPORINE (RESTASIS) 0.05 % ophthalmic emulsion 1 drop  1 drop Left Eye BID Orson Eva, MD   1 drop at 11/05/17 1009  . famotidine (PEPCID) IVPB 20 mg premix  20 mg Intravenous Q12H Reubin Milan, MD 100 mL/hr at 11/05/17 1008 20 mg at 11/05/17 1008  . levothyroxine (SYNTHROID, LEVOTHROID) tablet 75 mcg  75 mcg Oral QAC breakfast Reubin Milan, MD   75 mcg at 11/05/17 0802  . ondansetron (ZOFRAN) tablet 4 mg  4 mg Oral Q6H PRN Reubin Milan, MD       Or  . ondansetron [facility]) injection 4 mg  4 mg Intravenous Q6H PRN Reubin Milan, MD      . pantoprazole (PROTONIX) EC tablet 40 mg  40 mg Oral Daily Tat, Shanon Brow, MD   40 mg at 11/05/17 1009  . rosuvastatin (CRESTOR) tablet 10 mg  10 mg Oral QHS Reubin Milan, MD   10 mg at 11/04/17 2317  . senna-docusate (Senokot-S) tablet 1 tablet  1 tablet Oral QHS Reubin Milan, MD   1 tablet at 11/04/17 2317     Allergies as of 11/04/2017  . (No Known Allergies)    Family History  Problem Relation Age of Onset  . Colon cancer Mother 74  . Heart attack Mother   . Cancer Father   . Suicidality Son     Social History   Socioeconomic History  . Marital status: Married    Spouse name: Not on file  . Number of children: Not on file  . Years of education: Not on file  . Highest education level: Not on file  Occupational History  . 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 Financial resource strain: Not on file  . Food insecurity:    Worry: Not on file    Inability: Not on file  . Transportation needs:    Medical:  Not on file    Non-medical: Not on file  Tobacco Use  . Smoking status: Never Smoker  . Smokeless tobacco: Never Used  Substance and Sexual Activity  . Alcohol use: No    Alcohol/week: 0.0 standard drinks  . Drug use: No  . Sexual activity: Not on file  Lifestyle  . Physical activity:    Days per week: Not on file    Minutes per session: Not on file  . Stress: Not on file  Relationships  . Social connections:    Talks on phone: Not on file    Gets together: Not on file    Attends religious service: Not on file    Active member of club or organization: Not on file    Attends meetings of clubs or organizations: Not on file    Relationship status: Not on file  . Intimate partner violence:    Fear of current or ex partner: Not on file    Emotionally abused: Not on file    Physically abused: Not on file    Forced sexual activity: Not on file  Other Topics Concern  . Not on file  Social History Narrative  . Not on file    Review of Systems:  As in history of present illness.  Physical Exam: Vital signs in last 24 hours: Temp:  [97.6 F (36.4 C)-98.1 F (36.7 C)] 97.7 F (36.5 C) (10/13 0531) Pulse Rate:  [61-66] 61 (10/13 0531) Resp:  [16-18] 16 (10/13 0531) BP: (148-168)/(50-66) 158/66 (10/13 0531) SpO2:  [97  %-100 %] 97 % (10/13 0531) Weight:  [54.4 kg-55 kg] 55 kg (10/12 2244) Last BM Date: 11/05/17 General:   Alert,  pleasant and cooperative in NAD;  accompanied by daughter and husband. Lungs:  Clear throughout to auscultation.   No wheezes, crackles, or rhonchi. No acute distress. Heart:  Regular rate and rhythm; no murmurs, clicks, rubs,  or gallops. Abdomen: Non-distended. Positive bowel sounds. Soft with mild epigastric tenderness to palpation. No mass or organomegaly.   Intake/Output from previous day: 10/12 0701 - 10/13 0700 In: 844.1 [P.O.:60; I.V.:592.6; IV Piggyback:191.5] Out: -  Intake/Output this shift: Total I/O In: 120 [P.O.:120] Out: -   Lab Results: Recent Labs    11/04/17 1952 11/05/17 0221 11/05/17 0716  WBC 10.8*  --  7.8  HGB 13.5 12.5 12.5  HCT 42.1 39.3 39.8  PLT 201  --  176   BMET Recent Labs    11/04/17 1952 11/05/17 0716  NA 139 140  K 3.4* 3.0*  CL 103 105  CO2 29 28  GLUCOSE 109* 102*  BUN 18 14  CREATININE 1.04* 0.86  CALCIUM 8.7* 7.8*   LFT Recent Labs    11/04/17 1952  PROT 6.6  ALBUMIN 3.8  AST 17  ALT 13  ALKPHOS 80  BILITOT 0.8    Impression:  Pleasant 84 year old lady chronically anticoagulated admitted to the hospital with vague postprandial upper abdominal pain times a couple of weeks and dark stools.  She set a relatively trivial drop in hemoglobin, remaining in the normal range. No Hemoccults documented. CT and labs otherwise reassuring.  Pepto-Bismol use in the history. She also notes chronic constipation. I'm not convinced patient has had a significant GI bleed. She has vague postprandial upper abdominal pain. Nonspecific. Differential somewhat broad at this time ( ie.  Acid peptic disease, occult biliary tract disease, less likely ischemia +/- constipation component, etc.).  Given reported dark stools and upper abdominal pain, agree  with the need for further evaluation while anticoagulation therapy has been  held.  Recommendations:  I have offered the patient a diagnostic EGD tomorrow with propofol.  The risks, benefits, limitations, alternatives and imponderables have been reviewed with the patient. Potential for esophageal dilation, biopsy, etc. have also been reviewed.  Questions have been answered. All parties agreeable. We'll also add MiraLAX 17 g orally daily at bedtime to her regimen  Further recommendations to follow.         Notice:  This dictation was prepared with Dragon dictation along with smaller phrase technology. Any transcriptional errors that result from this process are unintentional and may not be corrected upon review.

## 2017-11-05 NOTE — Progress Notes (Signed)
PROGRESS NOTE  Abigail Taylor UDJ:[id number] DOB: 1931-11-22 DOA: 11/04/2017 PCP: [name], MD  Brief History:  84 year old female with a history of tachybradycardia syndrome, paroxysmal atrial fibrillation on apixaban, Parkinson's disease, orthostatic hypotension, hyperlipidemia, hypothyroidism presenting with constipation and melanotic stool.  The patient states that she normally has 2-3 bowel movements each week, but she requires the use of a rectal suppository to have a bowel movement.  The patient has been complaining of intermittent epigastric abdominal pain for the past 2 to 3 weeks.  She states that it usually is worse when she lays flat and is occasionally worse with food.  Her last bowel movement was on 11/02/2017.  Despite her bowel movement, the patient still felt constipated.  She stated that when she stuck her finger up her rectum, " it felt like I had walks up there".  The patient tried to use oral Dulcolax as well as a Dulcolax suppository with minimal success.  On 11/04/2017, the patient noted a small black stool.  Because of her intermittent abdominal pain, melena, and constipation, the patient presented for further evaluation.  Since admission, the patient has had large bowel movement.  However, she was not able to tell me if she had any melena.  She states that she had taken 2 doses of Pepto-Bismol with her last dose on 10/31/2017.  She denies any NSAIDs.  Assessment/Plan: Melena -?  Unclear if this may be related to her recent Pepto-Bismol usage -Hemoglobin is at baseline -GI consult -Clear liquid diet -PPI -Continue IV fluids -Hold apixaban  Paroxysmal atrial fibrillation -Presently in sinus rhythm -Continue amiodarone -Holding apixaban pending GI evaluation -CHADSVASc = 5  Tachybradycardia syndrome -Status post permanent pacemaker  Parkinson's disease with orthostatic hypotension -Continue Sinemet -will need to tolerate higher blood pressure at  baseline -follow up with Dr. Wells Guiles Susana Duell  Constipation -will need daily bowel regimen after d/c  Hypothyroidism -Continue Synthroid -09/29/2017 TSH 0.799  Hypokalemia -Replete  Hyperlipidemia -Continue statin    Disposition Plan:   Home in 1-2 days  Family Communication:  No Family at bedside  Consultants:  GI  Code Status:  FULL   DVT Prophylaxis:  SCDs   Procedures: As Listed in Progress Note Above  Antibiotics: None    Subjective: Patient denies fevers, chills, headache, chest pain, dyspnea, nausea, vomiting, diarrhea, abdominal pain, dysuria, hematuria, hematochezia, and melena.   Objective: Vitals:   11/04/17 1911 11/04/17 2244 11/05/17 0531  BP: (!) 148/50 (!) 168/64 (!) 158/66  Pulse: 62 66 61  Resp: 18 16 16   Temp: 98.1 F (36.7 C) 97.6 F (36.4 C) 97.7 F (36.5 C)  TempSrc: Temporal Oral Oral  SpO2: 100% 99% 97%  Weight: 54.4 kg 55 kg   Height: 5\' 6"  (1.676 m) 5\' 6"  (1.676 m)     Intake/Output Summary (Last 24 hours) at 11/05/2017 0656 Last data filed at 11/05/2017 5885 Gross per 24 hour  Intake 844.11 ml  Output -  Net 844.11 ml   Weight change:  Exam:   General:  Pt is alert, follows commands appropriately, not in acute distress  HEENT: No icterus, No thrush, No neck mass, [place]/AT  Cardiovascular: RRR, S1/S2, no rubs, no gallops  Respiratory: bibasilar crackles, no wheeze, good air movement  Abdomen: Soft/+BS, non tender, non distended, no guarding  Extremities: No edema, No lymphangitis, No petechiae, No rashes, no synovitis   Data Reviewed: I have personally reviewed following labs and imaging studies  Basic Metabolic Panel: Recent Labs  Lab 11/04/17 1952 11/04/17 2200  NA 139  --   K 3.4*  --   CL 103  --   CO2 29  --   GLUCOSE 109*  --   BUN 18  --   CREATININE 1.04*  --   CALCIUM 8.7*  --   MG  --  1.9  PHOS  --  3.5   Liver Function Tests: Recent Labs  Lab 11/04/17 1952  AST 17  ALT 13  ALKPHOS 80    BILITOT 0.8  PROT 6.6  ALBUMIN 3.8   Recent Labs  Lab 11/04/17 1952  LIPASE 29   No results for input(s): AMMONIA in the last 168 hours. Coagulation Profile: No results for input(s): INR, PROTIME in the last 168 hours. CBC: Recent Labs  Lab 11/04/17 1952 11/05/17 0221  WBC 10.8*  --   NEUTROABS 8.5*  --   HGB 13.5 12.5  HCT 42.1 39.3  MCV 85.6  --   PLT 201  --    Cardiac Enzymes: Recent Labs  Lab 11/04/17 1952  TROPONINI <0.03   BNP: Invalid input(s): POCBNP CBG: No results for input(s): GLUCAP in the last 168 hours. HbA1C: No results for input(s): HGBA1C in the last 72 hours. Urine analysis:    Component Value Date/Time   COLORURINE YELLOW 11/04/2017 2030   APPEARANCEUR CLEAR 11/04/2017 2030   LABSPEC 1.015 11/04/2017 2030   PHURINE 5.0 11/04/2017 2030   GLUCOSEU NEGATIVE 11/04/2017 2030   HGBUR SMALL (A) 11/04/2017 2030   [place] NEGATIVE 11/04/2017 2030   KETONESUR 5 (A) 11/04/2017 2030   PROTEINUR NEGATIVE 11/04/2017 2030   UROBILINOGEN 0.2 05/07/2014 2101   NITRITE NEGATIVE 11/04/2017 2030   LEUKOCYTESUR TRACE (A) 11/04/2017 2030   Sepsis Labs: @LABRCNTIP (QVZDGLOVFIEPP:2,RJJOACZYSAY:3) )No results found for this or any previous visit (from the past 240 hour(s)).   Scheduled Meds: . amiodarone  200 mg Oral Daily  . carbidopa-levodopa  1 tablet Oral TID WC  . Carbidopa-Levodopa ER  2 tablet Oral QHS  . levothyroxine  75 mcg Oral QAC breakfast  . Lifitegrast  1 drop Left Eye BID  . rosuvastatin  10 mg Oral QHS  . senna-docusate  1 tablet Oral QHS   Continuous Infusions: . 0.45 % NaCl with KCl 20 mEq / L 100 mL/hr at 11/05/17 0643  . famotidine (PEPCID) IV Stopped (11/05/17 0050)    Procedures/Studies: Dg Chest 2 View  Result Date: 11/04/2017 CLINICAL DATA:  Abdominal pain. EXAM: CHEST - 2 VIEW COMPARISON:  Chest radiograph 09/29/2017 FINDINGS: Multi lead pacer apparatus overlies the left hemithorax. Stable cardiomegaly. Aortic  atherosclerosis. No consolidative pulmonary opacities. No pleural effusion or pneumothorax. Thoracic spine degenerative changes. IMPRESSION: No acute cardiopulmonary process. Electronically Signed   By: Lovey Newcomer M.D.   On: 11/04/2017 21:52   Ct Head Wo Contrast  Result Date: 11/02/2017 CLINICAL DATA:  Huge appear EXAM: CT HEAD WITHOUT CONTRAST CT CERVICAL SPINE WITHOUT CONTRAST TECHNIQUE: Multidetector CT imaging of the head and cervical spine was performed following the standard protocol without intravenous contrast. Multiplanar CT image reconstructions of the cervical spine were also generated. COMPARISON:  None. FINDINGS: CT HEAD FINDINGS Brain: Mild age related involutional changes the brain with chronic appearing minimal small vessel ischemic disease of periventricular white matter. No hydrocephalus. Midline fourth ventricle and basal cisterns without effacement. The cerebellum and brainstem are nonacute. No intra-axial mass nor extra-axial fluid collections. No large vascular territory infarct. Vascular: No hyperdense vessel sign  Skull: Mild hyperostosis frontalis of the skull. No acute skull fracture. Sinuses/Orbits: No acute finding. Other: Right mastoid effusion likely representing otomastoiditis. No fracture is identified or significant overlying soft tissue swelling. CT CERVICAL SPINE FINDINGS Alignment: Maintained cervical lordosis Skull base and vertebrae: Intact skull base. No acute cervical spine fracture or static listhesis. Soft tissues and spinal canal: No prevertebral soft tissue swelling. No visible canal hematoma. Disc levels: Marked disc flattening at C5-6 and C6-7. No significant central or foraminal stenosis. Mild multilevel bilateral cervical facet arthropathy. Upper chest: Scarring at the apices. Other: None IMPRESSION: CT head: 1. Right mastoid effusion likely secondary to chronic otomastoiditis. No fracture of the mastoid is seen. 2. Mild age related involutional changes of the  brain without acute intracranial abnormality. CT cervical spine: Cervical spondylosis C5 through C7. No acute cervical spine fracture. Electronically Signed   By: Ashley Royalty M.D.   On: 11/02/2017 17:33   Ct Cervical Spine Wo Contrast  Result Date: 11/02/2017 CLINICAL DATA:  Huge appear EXAM: CT HEAD WITHOUT CONTRAST CT CERVICAL SPINE WITHOUT CONTRAST TECHNIQUE: Multidetector CT imaging of the head and cervical spine was performed following the standard protocol without intravenous contrast. Multiplanar CT image reconstructions of the cervical spine were also generated. COMPARISON:  None. FINDINGS: CT HEAD FINDINGS Brain: Mild age related involutional changes the brain with chronic appearing minimal small vessel ischemic disease of periventricular white matter. No hydrocephalus. Midline fourth ventricle and basal cisterns without effacement. The cerebellum and brainstem are nonacute. No intra-axial mass nor extra-axial fluid collections. No large vascular territory infarct. Vascular: No hyperdense vessel sign Skull: Mild hyperostosis frontalis of the skull. No acute skull fracture. Sinuses/Orbits: No acute finding. Other: Right mastoid effusion likely representing otomastoiditis. No fracture is identified or significant overlying soft tissue swelling. CT CERVICAL SPINE FINDINGS Alignment: Maintained cervical lordosis Skull base and vertebrae: Intact skull base. No acute cervical spine fracture or static listhesis. Soft tissues and spinal canal: No prevertebral soft tissue swelling. No visible canal hematoma. Disc levels: Marked disc flattening at C5-6 and C6-7. No significant central or foraminal stenosis. Mild multilevel bilateral cervical facet arthropathy. Upper chest: Scarring at the apices. Other: None IMPRESSION: CT head: 1. Right mastoid effusion likely secondary to chronic otomastoiditis. No fracture of the mastoid is seen. 2. Mild age related involutional changes of the brain without acute intracranial  abnormality. CT cervical spine: Cervical spondylosis C5 through C7. No acute cervical spine fracture. Electronically Signed   By: Ashley Royalty M.D.   On: 11/02/2017 17:33   Ct Abdomen Pelvis W Contrast  Result Date: 11/04/2017 CLINICAL DATA:  84 year old female with history of constipation. Black stools. EXAM: CT ABDOMEN AND PELVIS WITH CONTRAST TECHNIQUE: Multidetector CT imaging of the abdomen and pelvis was performed using the standard protocol following bolus administration of intravenous contrast. CONTRAST:  186mL OMNIPAQUE IOHEXOL 300 MG/ML  SOLN COMPARISON:  No priors. FINDINGS: Lower chest: Mild scarring in the lung bases. Pacemaker leads in the right atrium and right ventricular apex. Aortic atherosclerosis. Hepatobiliary: No suspicious cystic or solid hepatic lesions. No intra or extrahepatic biliary ductal dilatation. Gallbladder is normal in appearance. Pancreas: No pancreatic mass. No pancreatic ductal dilatation. No pancreatic or peripancreatic fluid or inflammatory changes. Spleen: Unremarkable. Adrenals/Urinary Tract: Bilateral kidneys and bilateral adrenal glands are normal in appearance. No hydroureteronephrosis. Urinary bladder is partially obscured by beam hardening artifact from the left hip arthroplasty, but otherwise unremarkable in appearance. Stomach/Bowel: The appearance of the stomach is normal. No pathologic dilatation of small  bowel or colon. Numerous colonic diverticulae are noted, without surrounding inflammatory changes to suggest an acute diverticulitis at this time. Stool burden does not appear excessive. The appendix is not confidently identified and may be surgically absent. Regardless, there are no inflammatory changes noted adjacent to the cecum to suggest the presence of an acute appendicitis at this time. Vascular/Lymphatic: Aortic atherosclerosis, without evidence of aneurysm or dissection in the abdominal or pelvic vasculature. No lymphadenopathy noted in the abdomen or  pelvis. Reproductive: Status post hysterectomy. Ovaries are not confidently identified may be surgically absent or atrophic. Other: No significant volume of ascites.  No pneumoperitoneum. Musculoskeletal: Status post left hip hemiarthroplasty. There are no aggressive appearing lytic or blastic lesions noted in the visualized portions of the skeleton. IMPRESSION: 1. No acute findings are noted in the abdomen or pelvis. 2. Colonic diverticulosis without evidence of acute diverticulitis at this time. 3. Overall, stool burden does not appear excessive. 4. Aortic atherosclerosis. 5. Additional incidental findings, as above. Electronically Signed   By: Vinnie Langton M.D.   On: 11/04/2017 21:38    Orson Eva, DO  [facility] Pager [phone]  If 7PM-7AM, please contact night-coverage [email or web address] Password [email or web address] 11/05/2017, 6:56 AM   LOS: 0 days

## 2017-11-06 ENCOUNTER — Other Ambulatory Visit: Payer: Self-pay

## 2017-11-06 ENCOUNTER — Encounter (HOSPITAL_COMMUNITY): Payer: Self-pay | Admitting: Anesthesiology

## 2017-11-06 ENCOUNTER — Observation Stay (HOSPITAL_COMMUNITY): Payer: Medicare Other | Admitting: Anesthesiology

## 2017-11-06 ENCOUNTER — Encounter (HOSPITAL_COMMUNITY)
Admission: EM | Disposition: A | Discharge status: Home / Self Care | Payer: Self-pay | Source: Home / Self Care | Attending: Emergency Medicine

## 2017-11-06 DIAGNOSIS — Z950 Presence of cardiac pacemaker: Secondary | ICD-10-CM | POA: Diagnosis not present

## 2017-11-06 DIAGNOSIS — I10 Essential (primary) hypertension: Secondary | ICD-10-CM | POA: Diagnosis not present

## 2017-11-06 DIAGNOSIS — K5730 Diverticulosis of large intestine without perforation or abscess without bleeding: Secondary | ICD-10-CM | POA: Diagnosis not present

## 2017-11-06 DIAGNOSIS — R1013 Epigastric pain: Secondary | ICD-10-CM

## 2017-11-06 DIAGNOSIS — K921 Melena: Secondary | ICD-10-CM

## 2017-11-06 DIAGNOSIS — Z7901 Long term (current) use of anticoagulants: Secondary | ICD-10-CM

## 2017-11-06 DIAGNOSIS — I480 Paroxysmal atrial fibrillation: Secondary | ICD-10-CM | POA: Diagnosis not present

## 2017-11-06 DIAGNOSIS — E785 Hyperlipidemia, unspecified: Secondary | ICD-10-CM | POA: Diagnosis not present

## 2017-11-06 DIAGNOSIS — G20 Parkinson's disease: Secondary | ICD-10-CM | POA: Diagnosis not present

## 2017-11-06 DIAGNOSIS — I951 Orthostatic hypotension: Secondary | ICD-10-CM | POA: Diagnosis not present

## 2017-11-06 DIAGNOSIS — E039 Hypothyroidism, unspecified: Secondary | ICD-10-CM | POA: Diagnosis not present

## 2017-11-06 DIAGNOSIS — E876 Hypokalemia: Secondary | ICD-10-CM | POA: Diagnosis not present

## 2017-11-06 DIAGNOSIS — K5909 Other constipation: Secondary | ICD-10-CM | POA: Diagnosis not present

## 2017-11-06 DIAGNOSIS — I495 Sick sinus syndrome: Secondary | ICD-10-CM | POA: Diagnosis not present

## 2017-11-06 HISTORY — PX: ESOPHAGOGASTRODUODENOSCOPY (EGD) WITH PROPOFOL: SHX5813

## 2017-11-06 LAB — URINE CULTURE: Culture: <10000 — AB

## 2017-11-06 LAB — BASIC METABOLIC PANEL
ANION GAP: 9 (ref 5–15)
BUN: 9 mg/dL (ref 8–23)
CHLORIDE: 106 mmol/L (ref 98–111)
CO2: 26 mmol/L (ref 22–32)
CREATININE: 0.66 mg/dL (ref 0.44–1.00)
Calcium: 8.5 mg/dL — ABNORMAL LOW (ref 8.9–10.3)
GFR calc non Af Amer: >60 mL/min (ref >60)
Glucose, Bld: 90 mg/dL (ref 70–99)
Potassium: 3.1 mmol/L — ABNORMAL LOW (ref 3.5–5.1)
Sodium: 141 mmol/L (ref 135–145)

## 2017-11-06 LAB — CBC
HEMATOCRIT: 42.7 % (ref 36.0–46.0)
HEMOGLOBIN: 13.5 g/dL (ref 12.0–15.0)
MCH: 27.2 pg (ref 26.0–34.0)
MCHC: 31.6 g/dL (ref 30.0–36.0)
MCV: 85.9 fL (ref 80.0–100.0)
Platelets: 190 10*3/uL (ref 150–400)
RBC: 4.97 MIL/uL (ref 3.87–5.11)
RDW: 13.8 % (ref 11.5–15.5)
WBC: 6.2 10*3/uL (ref 4.0–10.5)
nRBC: 0 % (ref 0.0–0.2)

## 2017-11-06 SURGERY — ESOPHAGOGASTRODUODENOSCOPY (EGD) WITH PROPOFOL
Anesthesia: Monitor Anesthesia Care

## 2017-11-06 MED ORDER — PROPOFOL 500 MG/50ML IV EMUL
INTRAVENOUS | Status: DC | PRN
  Administered 2017-11-06: 125 ug/kg/min via INTRAVENOUS

## 2017-11-06 MED ORDER — POTASSIUM CHLORIDE 10 MEQ/100ML IV SOLN
10.0000 meq | INTRAVENOUS | Status: AC
  Administered 2017-11-06 (×4): 10 meq via INTRAVENOUS
  Filled 2017-11-06: qty 100

## 2017-11-06 MED ORDER — LACTATED RINGERS IV SOLN
INTRAVENOUS | Status: DC | PRN
  Administered 2017-11-06: 15:00:00 via INTRAVENOUS

## 2017-11-06 MED ORDER — SODIUM CHLORIDE 0.9 % IV SOLN: INTRAVENOUS | Status: DC

## 2017-11-06 MED ORDER — LACTATED RINGERS IV SOLN
INTRAVENOUS | Status: DC
  Administered 2017-11-06: 15:00:00 via INTRAVENOUS

## 2017-11-06 MED ORDER — POLYETHYLENE GLYCOL 3350 17 G PO PACK: 17.0000 g | PACK | Freq: Every day | ORAL | 0 refills | Status: DC

## 2017-11-06 MED ORDER — ZOLPIDEM TARTRATE 5 MG PO TABS
5.0000 mg | ORAL_TABLET | Freq: Once | ORAL | Status: DC
  Filled 2017-11-06: qty 1

## 2017-11-06 MED ORDER — HYDRALAZINE HCL 20 MG/ML IJ SOLN: 10.0000 mg | Freq: Four times a day (QID) | INTRAMUSCULAR | Status: DC | PRN

## 2017-11-06 NOTE — Care Management Obs Status (Signed)
[place] NOTIFICATION   Patient Details  Name: Abigail Taylor MRN: [id number] Date of Birth: 1931/04/07   Medicare Observation Status Notification Given:  Yes    Shelda Altes 11/06/2017, 12:49 PM

## 2017-11-06 NOTE — Anesthesia Procedure Notes (Signed)
Procedure Name: MAC Performed by: Adams, Amy A, CRNA Pre-anesthesia Checklist: Patient identified, Emergency Drugs available, Suction available, Patient being monitored and Timeout performed Oxygen Delivery Method: Simple face mask       

## 2017-11-06 NOTE — Progress Notes (Signed)
Seen in short stay.  Remained stable.  Here for diagnostic EGD per plan.  Hypokalemia has been addressed. EGD today per plan.  Denies dysphagia.  The risks, benefits, limitations, alternatives and imponderables have been reviewed with the patient. Potential for esophageal dilation, biopsy, etc. have also been reviewed.  Questions have been answered. All parties agreeable.  Further recommendations to follow.

## 2017-11-06 NOTE — Anesthesia Postprocedure Evaluation (Signed)
Anesthesia Post Note  Patient: Abigail Taylor  Procedure(s) Performed: ESOPHAGOGASTRODUODENOSCOPY (EGD) WITH PROPOFOL (N/A )  Patient location during evaluation: PACU Anesthesia Type: MAC Level of consciousness: awake and patient cooperative Pain management: pain level controlled Vital Signs Assessment: post-procedure vital signs reviewed and stable Respiratory status: spontaneous breathing, nonlabored ventilation and respiratory function stable Cardiovascular status: blood pressure returned to baseline Postop Assessment: no apparent nausea or vomiting Anesthetic complications: no     Last Vitals:  Vitals:   11/06/17 1258 11/06/17 1428  BP: (!) 189/65 (!) 211/84  Pulse: 62 61  Resp: 18 12  Temp: 36.6 C 36.9 C  SpO2: 97% 99%    Last Pain:  Vitals:   11/06/17 1446  TempSrc:   PainSc: 0-No pain                 Braiden Rodman J

## 2017-11-06 NOTE — Progress Notes (Signed)
PROGRESS NOTE  Abigail Taylor SNK:[id number] DOB: June 28, 1931 DOA: 11/04/2017 PCP: [name], MD  Brief History:  84 year old female with a history of tachybradycardia syndrome, paroxysmal atrial fibrillation on apixaban, Parkinson's disease, orthostatic hypotension, hyperlipidemia, hypothyroidism presenting with constipation and melanotic stool.  The patient states that she normally has 2-3 bowel movements each week, but she requires the use of a rectal suppository to have a bowel movement.  The patient has been complaining of intermittent epigastric abdominal pain for the past 2 to 3 weeks.  She states that it usually is worse when she lays flat and is occasionally worse with food.  Her last bowel movement was on 11/02/2017.  Despite her bowel movement, the patient still felt constipated.  She stated that when she stuck her finger up her rectum, " it felt like I had walks up there".  The patient tried to use oral Dulcolax as well as a Dulcolax suppository with minimal success.  On 11/04/2017, the patient noted a small black stool.  Because of her intermittent abdominal pain, melena, and constipation, the patient presented for further evaluation.  Since admission, the patient has had large bowel movement.  However, she was not able to tell me if she had any melena.  She states that she had taken 2 doses of Pepto-Bismol with her last dose on 10/31/2017.  She denies any NSAIDs.  Assessment/Plan: Melena -?  Unclear if this may be related to her recent Pepto-Bismol usage -Hemoglobin is at baseline -GI consult appreciated-->EGD 11/06/17 -Clear liquid diet -PPI -Continued IV fluids prior to EGD -Restart apixaban when okay with GI  Paroxysmal atrial fibrillation -Presently in sinus rhythm -Continue amiodarone -Holding apixaban pending GI evaluation -CHADSVASc = 5  Tachybradycardia syndrome -Status post permanent pacemaker  Parkinson's disease with orthostatic  hypotension -Continue Sinemet -will need to tolerate higher blood pressure at baseline -follow up with Dr. Wells Guiles Avalie Oconnor  Constipation -will need daily bowel regimen after d/c  Hypothyroidism -Continue Synthroid -09/29/2017 TSH 0.799  Hypokalemia -Replete -Mag 1.9  Hyperlipidemia -Continue statin    Disposition Plan:   Home when cleared by GI  Family Communication:  spouse update at bedside 10/14  Consultants:  GI  Code Status:  FULL   DVT Prophylaxis:  SCDs   Procedures: As Listed in Progress Note Above  Antibiotics: None   Subjective: Pt c/o vague abdominal discomfort.  Denies cp, sob, n/v/d.  No f/c  Objective: Vitals:   11/05/17 1405 11/05/17 2147 11/06/17 0557 11/06/17 1258  BP: 132/87 (!) 183/71 (!) 180/60 (!) 189/65  Pulse: (!) 59 60 61 62  Resp: 17 16 16 18   Temp: 97.7 F (36.5 C) 98 F (36.7 C) 97.7 F (36.5 C) 97.9 F (36.6 C)  TempSrc: Oral Oral Oral Oral  SpO2: 98% 100% 100% 97%  Weight:      Height:        Intake/Output Summary (Last 24 hours) at 11/06/2017 1350 Last data filed at 11/06/2017 0800 Gross per 24 hour  Intake 100.8 ml  Output 400 ml  Net -299.2 ml   Weight change:  Exam:   General:  Pt is alert, follows commands appropriately, not in acute distress  HEENT: No icterus, No thrush, No neck mass, [place]/AT  Cardiovascular: RRR, S1/S2, no rubs, no gallops  Respiratory: Diminished BS at bases, no wheeze  Abdomen: Soft/+BS, non tender, non distended, no guarding  Extremities: No edema, No lymphangitis, No petechiae, No rashes, no synovitis  Data Reviewed: I have personally reviewed following labs and imaging studies Basic Metabolic Panel: Recent Labs  Lab 11/04/17 1952 11/04/17 2200 11/05/17 0716 11/06/17 0527  NA 139  --  140 141  K 3.4*  --  3.0* 3.1*  CL 103  --  105 106  CO2 29  --  28 26  GLUCOSE 109*  --  102* 90  BUN 18  --  14 9  CREATININE 1.04*  --  0.86 0.66  CALCIUM 8.7*  --  7.8*  8.5*  MG  --  1.9  --   --   PHOS  --  3.5  --   --    Liver Function Tests: Recent Labs  Lab 11/04/17 1952  AST 17  ALT 13  ALKPHOS 80  BILITOT 0.8  PROT 6.6  ALBUMIN 3.8   Recent Labs  Lab 11/04/17 1952  LIPASE 29   No results for input(s): AMMONIA in the last 168 hours. Coagulation Profile: No results for input(s): INR, PROTIME in the last 168 hours. CBC: Recent Labs  Lab 11/04/17 1952 11/05/17 0221 11/05/17 0716 11/06/17 0527  WBC 10.8*  --  7.8 6.2  NEUTROABS 8.5*  --   --   --   HGB 13.5 12.5 12.5 13.5  HCT 42.1 39.3 39.8 42.7  MCV 85.6  --  87.7 85.9  PLT 201  --  176 190   Cardiac Enzymes: Recent Labs  Lab 11/04/17 1952  TROPONINI <0.03   BNP: Invalid input(s): POCBNP CBG: No results for input(s): GLUCAP in the last 168 hours. HbA1C: No results for input(s): HGBA1C in the last 72 hours. Urine analysis:    Component Value Date/Time   COLORURINE YELLOW 11/04/2017 2030   APPEARANCEUR CLEAR 11/04/2017 2030   LABSPEC 1.015 11/04/2017 2030   PHURINE 5.0 11/04/2017 2030   GLUCOSEU NEGATIVE 11/04/2017 2030   HGBUR SMALL (A) 11/04/2017 2030   [place] NEGATIVE 11/04/2017 2030   KETONESUR 5 (A) 11/04/2017 2030   PROTEINUR NEGATIVE 11/04/2017 2030   UROBILINOGEN 0.2 05/07/2014 2101   NITRITE NEGATIVE 11/04/2017 2030   LEUKOCYTESUR TRACE (A) 11/04/2017 2030   Sepsis Labs: @LABRCNTIP (procalcitonin:4,lacticidven:4) ) Recent Results (from the past 240 hour(s))  Urine culture     Status: Abnormal   Collection Time: 11/04/17  8:30 PM  Result Value Ref Range Status   Specimen Description   Final    URINE, CLEAN CATCH Performed at [facility], [address] [place], [place] [address]    Special Requests   Final    NONE Performed at [facility], [address], [place], [place] [address]    Culture (A)  Final    <10,000 COLONIES/mL INSIGNIFICANT GROWTH Performed at [facility], [address], [place], [place] [address]    Report  Status 11/06/2017 FINAL  Final     Scheduled Meds: . amiodarone  200 mg Oral Daily  . carbidopa-levodopa  1 tablet Oral TID WC  . Carbidopa-Levodopa ER  2 tablet Oral QHS  . cycloSPORINE  1 drop Left Eye BID  . levothyroxine  75 mcg Oral QAC breakfast  . pantoprazole  40 mg Oral Daily  . polyethylene glycol  17 g Oral Daily  . rosuvastatin  10 mg Oral QHS  . senna-docusate  1 tablet Oral QHS  . zolpidem  5 mg Oral Once   Continuous Infusions: . famotidine (PEPCID) IV 20 mg (11/06/17 0906)    Procedures/Studies: Dg Chest 2 View  Result Date: 11/04/2017 CLINICAL  DATA:  Abdominal pain. EXAM: CHEST - 2 VIEW COMPARISON:  Chest radiograph 09/29/2017 FINDINGS: Multi lead pacer apparatus overlies the left hemithorax. Stable cardiomegaly. Aortic atherosclerosis. No consolidative pulmonary opacities. No pleural effusion or pneumothorax. Thoracic spine degenerative changes. IMPRESSION: No acute cardiopulmonary process. Electronically Signed   By: Lovey Newcomer M.D.   On: 11/04/2017 21:52   Ct Head Wo Contrast  Result Date: 11/02/2017 CLINICAL DATA:  Huge appear EXAM: CT HEAD WITHOUT CONTRAST CT CERVICAL SPINE WITHOUT CONTRAST TECHNIQUE: Multidetector CT imaging of the head and cervical spine was performed following the standard protocol without intravenous contrast. Multiplanar CT image reconstructions of the cervical spine were also generated. COMPARISON:  None. FINDINGS: CT HEAD FINDINGS Brain: Mild age related involutional changes the brain with chronic appearing minimal small vessel ischemic disease of periventricular white matter. No hydrocephalus. Midline fourth ventricle and basal cisterns without effacement. The cerebellum and brainstem are nonacute. No intra-axial mass nor extra-axial fluid collections. No large vascular territory infarct. Vascular: No hyperdense vessel sign Skull: Mild hyperostosis frontalis of the skull. No acute skull fracture. Sinuses/Orbits: No acute finding. Other:  Right mastoid effusion likely representing otomastoiditis. No fracture is identified or significant overlying soft tissue swelling. CT CERVICAL SPINE FINDINGS Alignment: Maintained cervical lordosis Skull base and vertebrae: Intact skull base. No acute cervical spine fracture or static listhesis. Soft tissues and spinal canal: No prevertebral soft tissue swelling. No visible canal hematoma. Disc levels: Marked disc flattening at C5-6 and C6-7. No significant central or foraminal stenosis. Mild multilevel bilateral cervical facet arthropathy. Upper chest: Scarring at the apices. Other: None IMPRESSION: CT head: 1. Right mastoid effusion likely secondary to chronic otomastoiditis. No fracture of the mastoid is seen. 2. Mild age related involutional changes of the brain without acute intracranial abnormality. CT cervical spine: Cervical spondylosis C5 through C7. No acute cervical spine fracture. Electronically Signed   By: Ashley Royalty M.D.   On: 11/02/2017 17:33   Ct Cervical Spine Wo Contrast  Result Date: 11/02/2017 CLINICAL DATA:  Huge appear EXAM: CT HEAD WITHOUT CONTRAST CT CERVICAL SPINE WITHOUT CONTRAST TECHNIQUE: Multidetector CT imaging of the head and cervical spine was performed following the standard protocol without intravenous contrast. Multiplanar CT image reconstructions of the cervical spine were also generated. COMPARISON:  None. FINDINGS: CT HEAD FINDINGS Brain: Mild age related involutional changes the brain with chronic appearing minimal small vessel ischemic disease of periventricular white matter. No hydrocephalus. Midline fourth ventricle and basal cisterns without effacement. The cerebellum and brainstem are nonacute. No intra-axial mass nor extra-axial fluid collections. No large vascular territory infarct. Vascular: No hyperdense vessel sign Skull: Mild hyperostosis frontalis of the skull. No acute skull fracture. Sinuses/Orbits: No acute finding. Other: Right mastoid effusion likely  representing otomastoiditis. No fracture is identified or significant overlying soft tissue swelling. CT CERVICAL SPINE FINDINGS Alignment: Maintained cervical lordosis Skull base and vertebrae: Intact skull base. No acute cervical spine fracture or static listhesis. Soft tissues and spinal canal: No prevertebral soft tissue swelling. No visible canal hematoma. Disc levels: Marked disc flattening at C5-6 and C6-7. No significant central or foraminal stenosis. Mild multilevel bilateral cervical facet arthropathy. Upper chest: Scarring at the apices. Other: None IMPRESSION: CT head: 1. Right mastoid effusion likely secondary to chronic otomastoiditis. No fracture of the mastoid is seen. 2. Mild age related involutional changes of the brain without acute intracranial abnormality. CT cervical spine: Cervical spondylosis C5 through C7. No acute cervical spine fracture. Electronically Signed   By: Ashley Royalty  M.D.   On: 11/02/2017 17:33   Ct Abdomen Pelvis W Contrast  Result Date: 11/04/2017 CLINICAL DATA:  84 year old female with history of constipation. Black stools. EXAM: CT ABDOMEN AND PELVIS WITH CONTRAST TECHNIQUE: Multidetector CT imaging of the abdomen and pelvis was performed using the standard protocol following bolus administration of intravenous contrast. CONTRAST:  170mL OMNIPAQUE IOHEXOL 300 MG/ML  SOLN COMPARISON:  No priors. FINDINGS: Lower chest: Mild scarring in the lung bases. Pacemaker leads in the right atrium and right ventricular apex. Aortic atherosclerosis. Hepatobiliary: No suspicious cystic or solid hepatic lesions. No intra or extrahepatic biliary ductal dilatation. Gallbladder is normal in appearance. Pancreas: No pancreatic mass. No pancreatic ductal dilatation. No pancreatic or peripancreatic fluid or inflammatory changes. Spleen: Unremarkable. Adrenals/Urinary Tract: Bilateral kidneys and bilateral adrenal glands are normal in appearance. No hydroureteronephrosis. Urinary bladder is  partially obscured by beam hardening artifact from the left hip arthroplasty, but otherwise unremarkable in appearance. Stomach/Bowel: The appearance of the stomach is normal. No pathologic dilatation of small bowel or colon. Numerous colonic diverticulae are noted, without surrounding inflammatory changes to suggest an acute diverticulitis at this time. Stool burden does not appear excessive. The appendix is not confidently identified and may be surgically absent. Regardless, there are no inflammatory changes noted adjacent to the cecum to suggest the presence of an acute appendicitis at this time. Vascular/Lymphatic: Aortic atherosclerosis, without evidence of aneurysm or dissection in the abdominal or pelvic vasculature. No lymphadenopathy noted in the abdomen or pelvis. Reproductive: Status post hysterectomy. Ovaries are not confidently identified may be surgically absent or atrophic. Other: No significant volume of ascites.  No pneumoperitoneum. Musculoskeletal: Status post left hip hemiarthroplasty. There are no aggressive appearing lytic or blastic lesions noted in the visualized portions of the skeleton. IMPRESSION: 1. No acute findings are noted in the abdomen or pelvis. 2. Colonic diverticulosis without evidence of acute diverticulitis at this time. 3. Overall, stool burden does not appear excessive. 4. Aortic atherosclerosis. 5. Additional incidental findings, as above. Electronically Signed   By: Vinnie Langton M.D.   On: 11/04/2017 21:38    Orson Eva, DO  [facility] Pager [phone]  If 7PM-7AM, please contact night-coverage [email or web address] Password [email or web address] 11/06/2017, 1:50 PM   LOS: 0 days

## 2017-11-06 NOTE — Anesthesia Preprocedure Evaluation (Signed)
Anesthesia Evaluation  Patient identified by MRN, date of birth, ID band Patient awake    Reviewed: Allergy & Precautions, H&P , NPO status , Patient's Chart, lab work & pertinent test results, reviewed documented beta blocker date and time   Airway Mallampati: II  TM Distance: >3 FB Neck ROM: full    Dental no notable dental hx.    Pulmonary neg pulmonary ROS,    Pulmonary exam normal breath sounds clear to auscultation       Cardiovascular Exercise Tolerance: Good hypertension, + angina + pacemaker  Rhythm:regular Rate:Normal     Neuro/Psych negative neurological ROS  negative psych ROS   GI/Hepatic negative GI ROS, Neg liver ROS,   Endo/Other  Hypothyroidism   Renal/GU negative Renal ROS  negative genitourinary   Musculoskeletal   Abdominal   Peds  Hematology negative hematology ROS (+)   Anesthesia Other Findings   Reproductive/Obstetrics negative OB ROS                             Anesthesia Physical Anesthesia Plan  ASA: III  Anesthesia Plan: MAC   Post-op Pain Management:    Induction:   PONV Risk Score and Plan:   Airway Management Planned:   Additional Equipment:   Intra-op Plan:   Post-operative Plan:   Informed Consent: I have reviewed the patients History and Physical, chart, labs and discussed the procedure including the risks, benefits and alternatives for the proposed anesthesia with the patient or authorized representative who has indicated his/her understanding and acceptance.     Plan Discussed with: CRNA  Anesthesia Plan Comments:         Anesthesia Quick Evaluation

## 2017-11-06 NOTE — Progress Notes (Signed)
Spoke with nursing staff. Patient has had nothing by mouth this morning. I placed order for NPO status except meds. Potassium 3.1 this morning. Spoke with Pamala Hurry in pre-op to notify and inform we will be giving IV runs of potassium. Spoke with Rodman Key, RN, to discuss need for replacement potassium IV. EGD with Propofol for today with Dr. Gala Romney. Discussed with both patient and husband, both understanding risks and benefits. Agreeable to proceed.  Abigail Needs, PhD, ANP-BC [facility]

## 2017-11-06 NOTE — Op Note (Signed)
[facility] Patient Name: Abigail Taylor Procedure Date: 11/06/2017 2:31 PM MRN: [id number] Date of Birth: 12/02/1931 Attending MD: Norvel Richards , MD CSN: [id number] Age: 84 Admit Type: Outpatient Procedure:                Upper GI endoscopy Indications:              Melena Providers:                Norvel Richards, MD, Jeanann Lewandowsky. Sharon Seller, RN,                            Nelma Rothman, Technician Referring MD:              Medicines:                Propofol per Anesthesia Complications:            No immediate complications. Estimated Blood Loss:     Estimated blood loss: none. Procedure:                Pre-Anesthesia Assessment:                           - Prior to the procedure, a History and Physical                            was performed, and patient medications and                            allergies were reviewed. The patient's tolerance of                            previous anesthesia was also reviewed. The risks                            and benefits of the procedure and the sedation                            options and risks were discussed with the patient.                            All questions were answered, and informed consent                            was obtained. Prior Anticoagulants: The patient has                            taken no previous anticoagulant or antiplatelet                            agents. ASA Grade Assessment: III - A patient with                            severe systemic disease. After reviewing the risks  and benefits, the patient was deemed in                            satisfactory condition to undergo the procedure.                           After obtaining informed consent, the endoscope was                            passed under direct vision. Throughout the                            procedure, the patient's blood pressure, pulse, and                            oxygen saturations were  monitored continuously. The                            [id number]-H190 ([id number]) scope was introduced through the                            and advanced to the second part of duodenum. The                            upper GI endoscopy was accomplished without                            difficulty. The patient tolerated the procedure                            well. Scope In: 2:52:53 PM Scope Out: 2:55:46 PM Total Procedure Duration: 0 hours 2 minutes 53 seconds  Findings:      The examined esophagus was normal.      The entire examined stomach was normal.      The duodenal bulb and second portion of the duodenum were normal. Impression:               - Normal esophagus.                           - Normal stomach.                           - Normal duodenal bulb and second portion of the                            duodenum. Hemoglobin and white count remained                            normal. Moderate Sedation:      Moderate (conscious) sedation was personally administered by an       anesthesia professional. The following parameters were monitored: oxygen       saturation, heart rate, blood pressure, respiratory rate, EKG, adequacy       of pulmonary ventilation, and response to care. Recommendation:           -  Return patient to hospital ward for observation.                           - Cardiac diet.                           - Continue present medications. MiraLAX 17 g orally                            at bedtime. If tolerates diet, could go home                            tomorrow. Follow with Dr. Otelia Limes in 2 weeks                           - No repeat upper endoscopy. I am not convinced the                            patient has had a significant, if any, GI bleed                            recently. From a GI standpoint, if clinically                            indicated, her Eliquis could be resumed tomorrow.                           - Return to GI office (Dr. Ricardo Jericho) in 2  weeks. Procedure Code(s):        --- Professional ---                           [phone], Esophagogastroduodenoscopy, flexible,                            transoral; with biopsy, single or multiple Diagnosis Code(s):        --- Professional ---                           K92.1, Melena (includes Hematochezia) CPT copyright 2018 American Medical Association. All rights reserved. The codes documented in this report are preliminary and upon coder review may  be revised to meet current compliance requirements. Cristopher Estimable. Waunetta Riggle, MD Norvel Richards, MD 11/06/2017 3:07:21 PM This report has been signed electronically. Number of Addenda: 0

## 2017-11-06 NOTE — Transfer of Care (Signed)
Immediate Anesthesia Transfer of Care Note  Patient: Abigail Taylor  Procedure(s) Performed: ESOPHAGOGASTRODUODENOSCOPY (EGD) WITH PROPOFOL (N/A )  Patient Location: PACU  Anesthesia Type:MAC  Level of Consciousness: drowsy  Airway & Oxygen Therapy: Patient Spontanous Breathing and Patient connected to nasal cannula oxygen  Post-op Assessment: Report given to RN and Post -op Vital signs reviewed and stable  Post vital signs: Reviewed and stable  Last Vitals:  Vitals Value Taken Time  BP    Temp    Pulse 99 11/06/2017  3:03 PM  Resp    SpO2 87 % 11/06/2017  3:03 PM  Vitals shown include unvalidated device data.  Last Pain:  Vitals:   11/06/17 1446  TempSrc:   PainSc: 0-No pain      Patients Stated Pain Goal: 5 (41/93/79 0240)  Complications: No apparent anesthesia complications

## 2017-11-06 NOTE — Discharge Summary (Signed)
Physician Discharge Summary  Abigail Taylor MOQ:[id number] DOB: May 14, 1931 DOA: 11/04/2017  PCP: [name], MD  Admit date: 11/04/2017 Discharge date: 11/07/2017  Admitted From: Home Disposition:  Home   Recommendations for Outpatient Follow-up:  1. Follow up with PCP in 1-2 weeks 2. Please obtain BMP/CBC in one week  Home Health: YES Equipment/Devices: HHPT  Discharge Condition: Stable CODE STATUS: FULL Diet recommendation: Heart Healthy   Brief/Interim Summary: 84 year old female with a history of tachybradycardia syndrome, paroxysmal atrial fibrillation on apixaban, Parkinson's disease, orthostatic hypotension, hyperlipidemia, hypothyroidism presenting with constipation and melanotic stool. The patient states that she normally has 2-3 bowel movements each week, but she requires the use of a rectal suppository to have a bowel movement. The patient has been complaining of intermittent epigastric abdominal pain for the past 2 to 3 weeks. She states that it usually is worse when she lays flat and is occasionally worse with food. Her last bowel movement was on 11/02/2017. Despite her bowel movement, the patient still felt constipated. She stated that when she stuck her finger up her rectum, "it felt like I had walks up there".The patient tried to use oral Dulcolax as well asaDulcolax suppository with minimal success. On 11/04/2017, the patient noted a small black stool. Because of her intermittent abdominal pain, melena, and constipation, the patient presented for further evaluation. Since admission, the patient has had large bowel movement. However, she was not able to tell me if she had any melena. She states that she had taken 2 doses of Pepto-Bismol with her last dose on 10/31/2017. She denies any NSAIDs.  Discharge Diagnoses:  Melena -?Unclear if this may be related to her recent Pepto-Bismol usage -Hemoglobin is at baseline>>>remained stable throughout  hospitalization -GI consult appreciated-->EGD 11/06/17-->normal esophagus, stomach and duodenum -Clear liquid diet initially>>>cardiac diet post EGD -PPI started initially -Continued IV fluids prior to EGD -Restart apixaban 11/07/17 -Hgb 14.0 on day of d/c  Paroxysmal atrial fibrillation -Presently in sinus rhythm -Continue amiodarone -Holding apixaban pending GI evaluation-->restart 11/07/17 -CHADSVASc = 5  Tachybradycardia syndrome -Status post permanent pacemaker -HR controlled  Parkinson's disease with orthostatic hypotension -Continue Sinemet -willneed to tolerate higher blood pressure at baseline -follow up with Dr. Wells Guiles Ondria Oswald -PT eval -hydralazine prn SBP>200  Constipation -will need daily bowel regimen after d/c -daily miralax  Hypothyroidism -Continue Synthroid -09/29/2017 TSH 0.799  Hypokalemia -Repleted -Mag 1.9  Hyperlipidemia -Continue statin    Discharge Instructions  Discharge Instructions    Ambulatory referral to Physical Therapy   Complete by:  As directed    PLEASE CONTINUE PATIENT'S OUTPATIENT PHYSICAL THERAPY TREATMENTS AS WAS PREVIOUSLY TO HOSPITAL ADMISSION     Allergies as of 11/07/2017   No Known Allergies     Medication List    TAKE these medications   amiodarone 200 MG tablet Commonly known as:  PACERONE Take 200 mg daily with largest meal What changed:    how much to take  how to take this  when to take this   bisacodyl 5 MG EC tablet Generic drug:  bisacodyl Take 10 mg by mouth 2 (two) times daily as needed for moderate constipation. What changed:  Another medication with the same name was removed. Continue taking this medication, and follow the directions you see here.   carbidopa-levodopa 50-200 MG tablet Commonly known as:  SINEMET CR TAKE 1 TABLET BY MOUTH AT BEDTIME   carbidopa-levodopa 25-100 MG tablet Commonly known as:  SINEMET IR Take 1 tablet by mouth 3 (three) times daily  with meals.     ELIQUIS 2.5 MG Tabs tablet Generic drug:  apixaban TAKE 1 TABLET BY MOUTH TWICE A DAY What changed:  how much to take   levothyroxine 75 MCG tablet Commonly known as:  SYNTHROID, LEVOTHROID Take 75 mcg by mouth daily before breakfast.   polyethylene glycol packet Commonly known as:  MIRALAX / GLYCOLAX Take 17 g by mouth daily.   rosuvastatin 10 MG tablet Commonly known as:  CRESTOR Take 10 mg by mouth at bedtime. Reported on 07/09/2015   senna-docusate 8.6-50 MG tablet Commonly known as:  Senokot-S Take 1 tablet by mouth at bedtime as needed for mild constipation.   XIIDRA 5 % Soln Generic drug:  Lifitegrast Place 1 drop into the left eye 2 (two) times daily.       No Known Allergies  Consultations:  GI--Rehman   Procedures/Studies: Dg Chest 2 View  Result Date: 11/04/2017 CLINICAL DATA:  Abdominal pain. EXAM: CHEST - 2 VIEW COMPARISON:  Chest radiograph 09/29/2017 FINDINGS: Multi lead pacer apparatus overlies the left hemithorax. Stable cardiomegaly. Aortic atherosclerosis. No consolidative pulmonary opacities. No pleural effusion or pneumothorax. Thoracic spine degenerative changes. IMPRESSION: No acute cardiopulmonary process. Electronically Signed   By: Lovey Newcomer M.D.   On: 11/04/2017 21:52   Ct Head Wo Contrast  Result Date: 11/02/2017 CLINICAL DATA:  Huge appear EXAM: CT HEAD WITHOUT CONTRAST CT CERVICAL SPINE WITHOUT CONTRAST TECHNIQUE: Multidetector CT imaging of the head and cervical spine was performed following the standard protocol without intravenous contrast. Multiplanar CT image reconstructions of the cervical spine were also generated. COMPARISON:  None. FINDINGS: CT HEAD FINDINGS Brain: Mild age related involutional changes the brain with chronic appearing minimal small vessel ischemic disease of periventricular white matter. No hydrocephalus. Midline fourth ventricle and basal cisterns without effacement. The cerebellum and brainstem are nonacute. No  intra-axial mass nor extra-axial fluid collections. No large vascular territory infarct. Vascular: No hyperdense vessel sign Skull: Mild hyperostosis frontalis of the skull. No acute skull fracture. Sinuses/Orbits: No acute finding. Other: Right mastoid effusion likely representing otomastoiditis. No fracture is identified or significant overlying soft tissue swelling. CT CERVICAL SPINE FINDINGS Alignment: Maintained cervical lordosis Skull base and vertebrae: Intact skull base. No acute cervical spine fracture or static listhesis. Soft tissues and spinal canal: No prevertebral soft tissue swelling. No visible canal hematoma. Disc levels: Marked disc flattening at C5-6 and C6-7. No significant central or foraminal stenosis. Mild multilevel bilateral cervical facet arthropathy. Upper chest: Scarring at the apices. Other: None IMPRESSION: CT head: 1. Right mastoid effusion likely secondary to chronic otomastoiditis. No fracture of the mastoid is seen. 2. Mild age related involutional changes of the brain without acute intracranial abnormality. CT cervical spine: Cervical spondylosis C5 through C7. No acute cervical spine fracture. Electronically Signed   By: Ashley Royalty M.D.   On: 11/02/2017 17:33   Ct Cervical Spine Wo Contrast  Result Date: 11/02/2017 CLINICAL DATA:  Huge appear EXAM: CT HEAD WITHOUT CONTRAST CT CERVICAL SPINE WITHOUT CONTRAST TECHNIQUE: Multidetector CT imaging of the head and cervical spine was performed following the standard protocol without intravenous contrast. Multiplanar CT image reconstructions of the cervical spine were also generated. COMPARISON:  None. FINDINGS: CT HEAD FINDINGS Brain: Mild age related involutional changes the brain with chronic appearing minimal small vessel ischemic disease of periventricular white matter. No hydrocephalus. Midline fourth ventricle and basal cisterns without effacement. The cerebellum and brainstem are nonacute. No intra-axial mass nor extra-axial  fluid collections. No large vascular  territory infarct. Vascular: No hyperdense vessel sign Skull: Mild hyperostosis frontalis of the skull. No acute skull fracture. Sinuses/Orbits: No acute finding. Other: Right mastoid effusion likely representing otomastoiditis. No fracture is identified or significant overlying soft tissue swelling. CT CERVICAL SPINE FINDINGS Alignment: Maintained cervical lordosis Skull base and vertebrae: Intact skull base. No acute cervical spine fracture or static listhesis. Soft tissues and spinal canal: No prevertebral soft tissue swelling. No visible canal hematoma. Disc levels: Marked disc flattening at C5-6 and C6-7. No significant central or foraminal stenosis. Mild multilevel bilateral cervical facet arthropathy. Upper chest: Scarring at the apices. Other: None IMPRESSION: CT head: 1. Right mastoid effusion likely secondary to chronic otomastoiditis. No fracture of the mastoid is seen. 2. Mild age related involutional changes of the brain without acute intracranial abnormality. CT cervical spine: Cervical spondylosis C5 through C7. No acute cervical spine fracture. Electronically Signed   By: Ashley Royalty M.D.   On: 11/02/2017 17:33   Ct Abdomen Pelvis W Contrast  Result Date: 11/04/2017 CLINICAL DATA:  84 year old female with history of constipation. Black stools. EXAM: CT ABDOMEN AND PELVIS WITH CONTRAST TECHNIQUE: Multidetector CT imaging of the abdomen and pelvis was performed using the standard protocol following bolus administration of intravenous contrast. CONTRAST:  189mL OMNIPAQUE IOHEXOL 300 MG/ML  SOLN COMPARISON:  No priors. FINDINGS: Lower chest: Mild scarring in the lung bases. Pacemaker leads in the right atrium and right ventricular apex. Aortic atherosclerosis. Hepatobiliary: No suspicious cystic or solid hepatic lesions. No intra or extrahepatic biliary ductal dilatation. Gallbladder is normal in appearance. Pancreas: No pancreatic mass. No pancreatic ductal  dilatation. No pancreatic or peripancreatic fluid or inflammatory changes. Spleen: Unremarkable. Adrenals/Urinary Tract: Bilateral kidneys and bilateral adrenal glands are normal in appearance. No hydroureteronephrosis. Urinary bladder is partially obscured by beam hardening artifact from the left hip arthroplasty, but otherwise unremarkable in appearance. Stomach/Bowel: The appearance of the stomach is normal. No pathologic dilatation of small bowel or colon. Numerous colonic diverticulae are noted, without surrounding inflammatory changes to suggest an acute diverticulitis at this time. Stool burden does not appear excessive. The appendix is not confidently identified and may be surgically absent. Regardless, there are no inflammatory changes noted adjacent to the cecum to suggest the presence of an acute appendicitis at this time. Vascular/Lymphatic: Aortic atherosclerosis, without evidence of aneurysm or dissection in the abdominal or pelvic vasculature. No lymphadenopathy noted in the abdomen or pelvis. Reproductive: Status post hysterectomy. Ovaries are not confidently identified may be surgically absent or atrophic. Other: No significant volume of ascites.  No pneumoperitoneum. Musculoskeletal: Status post left hip hemiarthroplasty. There are no aggressive appearing lytic or blastic lesions noted in the visualized portions of the skeleton. IMPRESSION: 1. No acute findings are noted in the abdomen or pelvis. 2. Colonic diverticulosis without evidence of acute diverticulitis at this time. 3. Overall, stool burden does not appear excessive. 4. Aortic atherosclerosis. 5. Additional incidental findings, as above. Electronically Signed   By: Vinnie Langton M.D.   On: 11/04/2017 21:38        Discharge Exam: Vitals:   11/06/17 2048 11/07/17 0522  BP: 132/62 (!) 143/62  Pulse: 61 (!) 59  Resp: 16 17  Temp: 98.2 F (36.8 C) 98.2 F (36.8 C)  SpO2: 99% 97%   Vitals:   11/06/17 1515 11/06/17 1545  11/06/17 2048 11/07/17 0522  BP: (!) 179/57 (!) 193/64 132/62 (!) 143/62  Pulse:  60 61 (!) 59  Resp:  18 16 17   Temp:  98.4  F (36.9 C) 98.2 F (36.8 C) 98.2 F (36.8 C)  TempSrc:  Oral Oral Oral  SpO2:  98% 99% 97%  Weight:      Height:        General: Pt is alert, awake, not in acute distress Cardiovascular: RRR, S1/S2 +, no rubs, no gallops Respiratory: diminished BS at bases, no wheeze Abdominal: Soft, NT, ND, bowel sounds + Extremities: no edema, no cyanosis   The results of significant diagnostics from this hospitalization (including imaging, microbiology, ancillary and laboratory) are listed below for reference.    Significant Diagnostic Studies: Dg Chest 2 View  Result Date: 11/04/2017 CLINICAL DATA:  Abdominal pain. EXAM: CHEST - 2 VIEW COMPARISON:  Chest radiograph 09/29/2017 FINDINGS: Multi lead pacer apparatus overlies the left hemithorax. Stable cardiomegaly. Aortic atherosclerosis. No consolidative pulmonary opacities. No pleural effusion or pneumothorax. Thoracic spine degenerative changes. IMPRESSION: No acute cardiopulmonary process. Electronically Signed   By: Lovey Newcomer M.D.   On: 11/04/2017 21:52   Ct Head Wo Contrast  Result Date: 11/02/2017 CLINICAL DATA:  Huge appear EXAM: CT HEAD WITHOUT CONTRAST CT CERVICAL SPINE WITHOUT CONTRAST TECHNIQUE: Multidetector CT imaging of the head and cervical spine was performed following the standard protocol without intravenous contrast. Multiplanar CT image reconstructions of the cervical spine were also generated. COMPARISON:  None. FINDINGS: CT HEAD FINDINGS Brain: Mild age related involutional changes the brain with chronic appearing minimal small vessel ischemic disease of periventricular white matter. No hydrocephalus. Midline fourth ventricle and basal cisterns without effacement. The cerebellum and brainstem are nonacute. No intra-axial mass nor extra-axial fluid collections. No large vascular territory infarct.  Vascular: No hyperdense vessel sign Skull: Mild hyperostosis frontalis of the skull. No acute skull fracture. Sinuses/Orbits: No acute finding. Other: Right mastoid effusion likely representing otomastoiditis. No fracture is identified or significant overlying soft tissue swelling. CT CERVICAL SPINE FINDINGS Alignment: Maintained cervical lordosis Skull base and vertebrae: Intact skull base. No acute cervical spine fracture or static listhesis. Soft tissues and spinal canal: No prevertebral soft tissue swelling. No visible canal hematoma. Disc levels: Marked disc flattening at C5-6 and C6-7. No significant central or foraminal stenosis. Mild multilevel bilateral cervical facet arthropathy. Upper chest: Scarring at the apices. Other: None IMPRESSION: CT head: 1. Right mastoid effusion likely secondary to chronic otomastoiditis. No fracture of the mastoid is seen. 2. Mild age related involutional changes of the brain without acute intracranial abnormality. CT cervical spine: Cervical spondylosis C5 through C7. No acute cervical spine fracture. Electronically Signed   By: Ashley Royalty M.D.   On: 11/02/2017 17:33   Ct Cervical Spine Wo Contrast  Result Date: 11/02/2017 CLINICAL DATA:  Huge appear EXAM: CT HEAD WITHOUT CONTRAST CT CERVICAL SPINE WITHOUT CONTRAST TECHNIQUE: Multidetector CT imaging of the head and cervical spine was performed following the standard protocol without intravenous contrast. Multiplanar CT image reconstructions of the cervical spine were also generated. COMPARISON:  None. FINDINGS: CT HEAD FINDINGS Brain: Mild age related involutional changes the brain with chronic appearing minimal small vessel ischemic disease of periventricular white matter. No hydrocephalus. Midline fourth ventricle and basal cisterns without effacement. The cerebellum and brainstem are nonacute. No intra-axial mass nor extra-axial fluid collections. No large vascular territory infarct. Vascular: No hyperdense vessel  sign Skull: Mild hyperostosis frontalis of the skull. No acute skull fracture. Sinuses/Orbits: No acute finding. Other: Right mastoid effusion likely representing otomastoiditis. No fracture is identified or significant overlying soft tissue swelling. CT CERVICAL SPINE FINDINGS Alignment: Maintained cervical lordosis Skull  base and vertebrae: Intact skull base. No acute cervical spine fracture or static listhesis. Soft tissues and spinal canal: No prevertebral soft tissue swelling. No visible canal hematoma. Disc levels: Marked disc flattening at C5-6 and C6-7. No significant central or foraminal stenosis. Mild multilevel bilateral cervical facet arthropathy. Upper chest: Scarring at the apices. Other: None IMPRESSION: CT head: 1. Right mastoid effusion likely secondary to chronic otomastoiditis. No fracture of the mastoid is seen. 2. Mild age related involutional changes of the brain without acute intracranial abnormality. CT cervical spine: Cervical spondylosis C5 through C7. No acute cervical spine fracture. Electronically Signed   By: Ashley Royalty M.D.   On: 11/02/2017 17:33   Ct Abdomen Pelvis W Contrast  Result Date: 11/04/2017 CLINICAL DATA:  84 year old female with history of constipation. Black stools. EXAM: CT ABDOMEN AND PELVIS WITH CONTRAST TECHNIQUE: Multidetector CT imaging of the abdomen and pelvis was performed using the standard protocol following bolus administration of intravenous contrast. CONTRAST:  160mL OMNIPAQUE IOHEXOL 300 MG/ML  SOLN COMPARISON:  No priors. FINDINGS: Lower chest: Mild scarring in the lung bases. Pacemaker leads in the right atrium and right ventricular apex. Aortic atherosclerosis. Hepatobiliary: No suspicious cystic or solid hepatic lesions. No intra or extrahepatic biliary ductal dilatation. Gallbladder is normal in appearance. Pancreas: No pancreatic mass. No pancreatic ductal dilatation. No pancreatic or peripancreatic fluid or inflammatory changes. Spleen:  Unremarkable. Adrenals/Urinary Tract: Bilateral kidneys and bilateral adrenal glands are normal in appearance. No hydroureteronephrosis. Urinary bladder is partially obscured by beam hardening artifact from the left hip arthroplasty, but otherwise unremarkable in appearance. Stomach/Bowel: The appearance of the stomach is normal. No pathologic dilatation of small bowel or colon. Numerous colonic diverticulae are noted, without surrounding inflammatory changes to suggest an acute diverticulitis at this time. Stool burden does not appear excessive. The appendix is not confidently identified and may be surgically absent. Regardless, there are no inflammatory changes noted adjacent to the cecum to suggest the presence of an acute appendicitis at this time. Vascular/Lymphatic: Aortic atherosclerosis, without evidence of aneurysm or dissection in the abdominal or pelvic vasculature. No lymphadenopathy noted in the abdomen or pelvis. Reproductive: Status post hysterectomy. Ovaries are not confidently identified may be surgically absent or atrophic. Other: No significant volume of ascites.  No pneumoperitoneum. Musculoskeletal: Status post left hip hemiarthroplasty. There are no aggressive appearing lytic or blastic lesions noted in the visualized portions of the skeleton. IMPRESSION: 1. No acute findings are noted in the abdomen or pelvis. 2. Colonic diverticulosis without evidence of acute diverticulitis at this time. 3. Overall, stool burden does not appear excessive. 4. Aortic atherosclerosis. 5. Additional incidental findings, as above. Electronically Signed   By: Vinnie Langton M.D.   On: 11/04/2017 21:38     Microbiology: Recent Results (from the past 240 hour(s))  Urine culture     Status: Abnormal   Collection Time: 11/04/17  8:30 PM  Result Value Ref Range Status   Specimen Description   Final    URINE, CLEAN CATCH Performed at [facility], [address] [place], [place] [address]    Special  Requests   Final    NONE Performed at [facility], [address] [place], [place] [address]    Culture (A)  Final    <10,000 COLONIES/mL INSIGNIFICANT GROWTH Performed at [facility] [address], [place], [place] [address]    Report Status 11/06/2017 FINAL  Final     Labs: Basic Metabolic Panel: Recent Labs  Lab 11/04/17  1952 11/04/17 2200 11/05/17 0716 11/06/17 0527 11/07/17 0430  NA 139  --  140 141 141  K 3.4*  --  3.0* 3.1* 3.2*  CL 103  --  105 106 107  CO2 29  --  28 26 26   GLUCOSE 109*  --  102* 90 89  BUN 18  --  14 9 9   CREATININE 1.04*  --  0.86 0.66 0.78  CALCIUM 8.7*  --  7.8* 8.5* 8.5*  MG  --  1.9  --   --  1.9  PHOS  --  3.5  --   --   --    Liver Function Tests: Recent Labs  Lab 11/04/17 1952  AST 17  ALT 13  ALKPHOS 80  BILITOT 0.8  PROT 6.6  ALBUMIN 3.8   Recent Labs  Lab 11/04/17 1952  LIPASE 29   No results for input(s): AMMONIA in the last 168 hours. CBC: Recent Labs  Lab 11/04/17 1952 11/05/17 0221 11/05/17 0716 11/06/17 0527 11/07/17 0430  WBC 10.8*  --  7.8 6.2 7.4  NEUTROABS 8.5*  --   --   --   --   HGB 13.5 12.5 12.5 13.5 14.0  HCT 42.1 39.3 39.8 42.7 45.2  MCV 85.6  --  87.7 85.9 86.8  PLT 201  --  176 190 202   Cardiac Enzymes: Recent Labs  Lab 11/04/17 1952  TROPONINI <0.03   BNP: Invalid input(s): POCBNP CBG: No results for input(s): GLUCAP in the last 168 hours.  Time coordinating discharge:  36 minutes  Signed:  Orson Eva, DO [facility] Pager: [phone] 11/07/2017, 12:58 PM

## 2017-11-06 NOTE — Care Management (Signed)
CM consulted for home health. Patient reportedly has Outpatient PT and SLP services.

## 2017-11-07 ENCOUNTER — Telehealth: Payer: Self-pay | Admitting: Gastroenterology

## 2017-11-07 DIAGNOSIS — K921 Melena: Secondary | ICD-10-CM | POA: Diagnosis not present

## 2017-11-07 DIAGNOSIS — E785 Hyperlipidemia, unspecified: Secondary | ICD-10-CM | POA: Diagnosis not present

## 2017-11-07 DIAGNOSIS — G20 Parkinson's disease: Secondary | ICD-10-CM | POA: Diagnosis not present

## 2017-11-07 DIAGNOSIS — I480 Paroxysmal atrial fibrillation: Secondary | ICD-10-CM | POA: Diagnosis not present

## 2017-11-07 DIAGNOSIS — E876 Hypokalemia: Secondary | ICD-10-CM | POA: Diagnosis not present

## 2017-11-07 DIAGNOSIS — Z7901 Long term (current) use of anticoagulants: Secondary | ICD-10-CM | POA: Diagnosis not present

## 2017-11-07 DIAGNOSIS — I10 Essential (primary) hypertension: Secondary | ICD-10-CM | POA: Diagnosis not present

## 2017-11-07 LAB — BASIC METABOLIC PANEL
ANION GAP: 8 (ref 5–15)
BUN: 9 mg/dL (ref 8–23)
CALCIUM: 8.5 mg/dL — AB (ref 8.9–10.3)
CO2: 26 mmol/L (ref 22–32)
CREATININE: 0.78 mg/dL (ref 0.44–1.00)
Chloride: 107 mmol/L (ref 98–111)
Glucose, Bld: 89 mg/dL (ref 70–99)
Potassium: 3.2 mmol/L — ABNORMAL LOW (ref 3.5–5.1)
SODIUM: 141 mmol/L (ref 135–145)

## 2017-11-07 LAB — CBC
HCT: 45.2 % (ref 36.0–46.0)
Hemoglobin: 14 g/dL (ref 12.0–15.0)
MCH: 26.9 pg (ref 26.0–34.0)
MCHC: 31 g/dL (ref 30.0–36.0)
MCV: 86.8 fL (ref 80.0–100.0)
NRBC: 0 % (ref 0.0–0.2)
Platelets: 202 10*3/uL (ref 150–400)
RBC: 5.21 MIL/uL — ABNORMAL HIGH (ref 3.87–5.11)
RDW: 13.8 % (ref 11.5–15.5)
WBC: 7.4 10*3/uL (ref 4.0–10.5)

## 2017-11-07 LAB — MAGNESIUM: Magnesium: 1.9 mg/dL (ref 1.7–2.4)

## 2017-11-07 MED ORDER — POTASSIUM CHLORIDE CRYS ER 20 MEQ PO TBCR
40.0000 meq | EXTENDED_RELEASE_TABLET | Freq: Once | ORAL | Status: AC
  Administered 2017-11-07: 40 meq via ORAL
  Filled 2017-11-07: qty 2

## 2017-11-07 MED ORDER — SODIUM CHLORIDE 0.9 % IV SOLN
INTRAVENOUS | Status: DC | PRN
  Administered 2017-11-07: 250 mL via INTRAVENOUS

## 2017-11-07 NOTE — Evaluation (Signed)
Physical Therapy Evaluation Patient Details Name: Abigail Taylor MRN: [id number] DOB: [birth year] Today's Date: 11/07/2017   History of Present Illness  Abigail Taylor is a 84 y.o. female with medical history significant of angina, aortic insufficiency, paroxysmal atrial fibrillation on apixaban, bradycardia tachycardia syndrome, history of dual-chamber Medtronic pacemaker placement, chronic back pain, constipation, gait instability, Parkinson's disease, hyperlipidemia, hypothyroidism who is brought to the emergency department due to severe constipation for the past 2 days which has not responded to Dulcolax tablets and suppositories.  She said that she had her last BM 2 days ago, but since then she had only had a small loose stool BM after using a suppository.  She complains of frequent melena for the past week or 2.  She thought that it was because she took a teaspoon of Pepto-Bismol on Monday, but she only took that dose, but she has had multiple melanotic stools that cannot be attributed to this.  She mentions that prior to having melena, she has been having epigastric abdominal pain for over a month.exacerbates with food intake.  She denies emesis, but states she often feels nauseated, particularly after food intake.  She denies hematochezia.  She denies fever, chills, sore throat, wheezing, dyspnea, chest pain, palpitations, diaphoresis, PND, orthopnea or recent pitting edema of the lower extremities.  No dysuria, frequency or hematuria.  No polyuria, polydipsia, polyphagia or blurred vision.    Clinical Impression  Patient functioning near baseline for functional mobility and gait, other than slightly unsteady when walking, able to ambulate in hallway without use of AD with occasional leaning on nearby objects for support, no loss of balance and tolerated sitting up in chair after therapy.  Plan:  Patient discharged from physical therapy to care of nursing for ambulation daily as tolerated for  length of stay with recommendation below to help improve balance and safety for ambulation.     Follow Up Recommendations Outpatient PT    Equipment Recommendations  None recommended by PT    Recommendations for Other Services       Precautions / Restrictions Precautions Precautions: Fall Restrictions Weight Bearing Restrictions: No      Mobility  Bed Mobility Overal bed mobility: Modified Independent                Transfers Overall transfer level: Modified independent                  Ambulation/Gait Ambulation/Gait assistance: Min guard Gait Distance (Feet): 80 Feet Assistive device: None Gait Pattern/deviations: Decreased step length - right;Decreased step length - left;Decreased stride length Gait velocity: decreased   General Gait Details: slightly unsteady with occasional leaning on nearby objects for support, no loss of balance, limited secondary to fatigue  Stairs            Wheelchair Mobility    Modified Rankin (Stroke Patients Only)       Balance Overall balance assessment: Mild deficits observed, not formally tested                                           Pertinent Vitals/Pain Pain Assessment: No/denies pain    Home Living Family/patient expects to be discharged to:: Private residence Living Arrangements: Spouse/significant other   Type of Home: House Home Access: Stairs to enter Entrance Stairs-Rails: None Entrance Stairs-Number of Steps: 1 Home Layout: One level Home Equipment: [employer] -  single point;Walker - 2 wheels;Shower seat;Bedside commode      Prior Function Level of Independence: Needs assistance   Gait / Transfers Assistance Needed: household ambulator without AD  ADL's / Homemaking Assistance Needed: assisted by spouse        Hand Dominance        Extremity/Trunk Assessment   Upper Extremity Assessment Upper Extremity Assessment: Overall WFL for tasks assessed    Lower  Extremity Assessment Lower Extremity Assessment: Generalized weakness    Cervical / Trunk Assessment Cervical / Trunk Assessment: Normal  Communication   Communication: No difficulties  Cognition Arousal/Alertness: Awake/alert Behavior During Therapy: WFL for tasks assessed/performed Overall Cognitive Status: Within Functional Limits for tasks assessed                                        General Comments      Exercises     Assessment/Plan    PT Assessment All further PT needs can be met in the next venue of care  PT Problem List Decreased strength;Decreased activity tolerance;Decreased balance;Decreased mobility       PT Treatment Interventions      PT Goals (Current goals can be found in the Care Plan section)  Acute Rehab PT Goals Patient Stated Goal: return home with spouse to assist PT Goal Formulation: With patient/family Time For Goal Achievement: 11/07/17 Potential to Achieve Goals: Good    Frequency     Barriers to discharge        Co-evaluation               AM-PAC PT "6 Clicks" Daily Activity  Outcome Measure Difficulty turning over in bed (including adjusting bedclothes, sheets and blankets)?: None Difficulty moving from lying on back to sitting on the side of the bed? : None Difficulty sitting down on and standing up from a chair with arms (e.g., wheelchair, bedside commode, etc,.)?: None Help needed moving to and from a bed to chair (including a wheelchair)?: None Help needed walking in hospital room?: A Little Help needed climbing 3-5 steps with a railing? : A Little 6 Click Score: 22    End of Session   Activity Tolerance: Patient tolerated treatment well;Patient limited by fatigue Patient left: in chair;with call bell/phone within reach;with family/visitor present Nurse Communication: Mobility status PT Visit Diagnosis: Unsteadiness on feet (R26.81);Other abnormalities of gait and mobility (R26.89);Muscle weakness  (generalized) (M62.81)    Time: 4585-9292 PT Time Calculation (min) (ACUTE ONLY): 27 min   Charges:   PT Evaluation $PT Eval Moderate Complexity: 1 Mod PT Treatments $Therapeutic Activity: 23-37 mins        2:14 PM, 11/07/17 Lonell Grandchild, MPT Physical Therapist with [facility] [phone] office [phone] mobile phone

## 2017-11-07 NOTE — Telephone Encounter (Signed)
Forwarded to Mitzi to schedule OV 

## 2017-11-07 NOTE — Telephone Encounter (Signed)
Please arrange outpatient hospital follow-up with Dr. Laural Golden in 2 weeks. Thanks!

## 2017-11-07 NOTE — Progress Notes (Signed)
Pt discharged home today per Dr. Tat. Pt's IV site D/C'd and WDL. Pt's VSS. Pt provided with home medication list, discharge instructions and prescriptions. Verbalized understanding. Pt left floor via WC in stable condition accompanied by NT. 

## 2017-11-07 NOTE — Progress Notes (Signed)
    Subjective: Husband at bedside. Tolerating diet. No overt GI bleeding. No abdominal pain.   Objective: Vital signs in last 24 hours: Temp:  [97.9 F (36.6 C)-98.5 F (36.9 C)] 98.2 F (36.8 C) (10/15 0522) Pulse Rate:  [59-64] 59 (10/15 0522) Resp:  [12-18] 17 (10/15 0522) BP: (132-211)/(57-84) 143/62 (10/15 0522) SpO2:  [97 %-100 %] 97 % (10/15 0522) Weight:  [55 kg] 55 kg (10/14 1428) Last BM Date: 11/05/17 General:   Alert and oriented, pleasant Head:  Normocephalic and atraumatic.  Abdomen:  Bowel sounds present, soft, non-tender, non-distended. No HSM or hernias noted. No rebound or guarding. Neurologic:  Alert and  oriented x4 Psych:  Alert and cooperative. Normal mood and affect.  Intake/Output from previous day: 10/14 0701 - 10/15 0700 In: 1676.4 [P.O.:480; I.V.:351.8; IV Piggyback:844.7] Out: 400 [Urine:400] Intake/Output this shift: Total I/O In: 45.9 [IV Piggyback:45.9] Out: -   Lab Results: Recent Labs    11/05/17 0716 11/06/17 0527 11/07/17 0430  WBC 7.8 6.2 7.4  HGB 12.5 13.5 14.0  HCT 39.8 42.7 45.2  PLT 176 190 202   BMET Recent Labs    11/05/17 0716 11/06/17 0527 11/07/17 0430  NA 140 141 141  K 3.0* 3.1* 3.2*  CL 105 106 107  CO2 28 26 26   GLUCOSE 102* 90 89  BUN 14 9 9   CREATININE 0.86 0.66 0.78  CALCIUM 7.8* 8.5* 8.5*   LFT Recent Labs    11/04/17 1952  PROT 6.6  ALBUMIN 3.8  AST 17  ALT 13  ALKPHOS 80  BILITOT 0.8    Assessment: 84 year old female admitted with black stools but no significant drop in Hgb on Eliquis, heme positive. EGD 10/14 normal. Notably, reports of pepto bismol prior to admission. Hgb remaining entirely normal. May resume Eliquis today. Will sign off, and we will arrange an outpatient follow-up with Dr. Laural Golden.     Plan: Outpatient follow-up with Dr. Laural Golden in 2 weeks  May resume Eliquis today PPI daily Signing off  Annitta Needs, PhD, ANP-BC [facility]     LOS: 0 days     11/07/2017, 8:11 AM

## 2017-11-08 ENCOUNTER — Ambulatory Visit (HOSPITAL_COMMUNITY): Payer: Medicare Other | Admitting: Speech Pathology

## 2017-11-08 ENCOUNTER — Encounter (HOSPITAL_COMMUNITY): Payer: Self-pay | Admitting: Speech Pathology

## 2017-11-08 ENCOUNTER — Ambulatory Visit (HOSPITAL_COMMUNITY): Payer: Medicare Other

## 2017-11-08 ENCOUNTER — Encounter (HOSPITAL_COMMUNITY): Payer: Self-pay

## 2017-11-08 DIAGNOSIS — R2681 Unsteadiness on feet: Secondary | ICD-10-CM | POA: Diagnosis not present

## 2017-11-08 DIAGNOSIS — R2689 Other abnormalities of gait and mobility: Secondary | ICD-10-CM

## 2017-11-08 DIAGNOSIS — M6281 Muscle weakness (generalized): Secondary | ICD-10-CM | POA: Diagnosis not present

## 2017-11-08 DIAGNOSIS — R471 Dysarthria and anarthria: Secondary | ICD-10-CM | POA: Diagnosis not present

## 2017-11-08 DIAGNOSIS — R293 Abnormal posture: Secondary | ICD-10-CM | POA: Diagnosis not present

## 2017-11-08 NOTE — Therapy (Signed)
[facility], [place], [address] Phone: [phone]   Fax:  [phone]  Physical Therapy Treatment  Patient Details  Name: Abigail Taylor MRN: [id number] Date of Birth: Jan 27, 1931 Referring Provider (PT): Alonza Bogus, DO   Encounter Date: 11/08/2017  PT End of Session - 11/08/17 1122    Visit Number  3    Number of Visits  11    Date for PT Re-Evaluation  11/22/17    Authorization Type  Medicare (secondary: generic commercial)    Authorization Time Period  09/21/17 to 10/19/17; NEW: 11/01/17 to 11/22/17    Authorization - Visit Number  3    Authorization - Number of Visits  11    PT Start Time  9030    PT Stop Time  1155    PT Time Calculation (min)  39 min    Equipment Utilized During Treatment  Gait belt    Activity Tolerance  Patient tolerated treatment well;No increased pain    Behavior During Therapy  WFL for tasks assessed/performed       Past Medical History:  Diagnosis Date  . Angina at rest, with the tachycardia 07/05/2011  . Aortic insufficiency 07/05/2011  . Atrial fibrillation ([place])   . Brady-tachy syndrome ([place])    Dual chamber Medtronic pacemaker Adapta  . Chronic back pain   . Constipation 12/27/2011  . Gait instability   . Hyperlipidemia   . Hypothyroidism   . Normal cardiac stress test 2013  . OA (osteoarthritis)   . Pacemaker   . Parkinson's disease ([place])   . Tremor     Past Surgical History:  Procedure Laterality Date  . ABDOMINAL HYSTERECTOMY    . CARDIAC CATHETERIZATION  05/26/1999   normal  . CARDIAC CATHETERIZATION  2001   normal coronary arteries  . cataract    . COLONOSCOPY N/A 11/15/2012   Procedure: COLONOSCOPY;  Surgeon: [name], MD;  [facility];  Service: Endoscopy;  Laterality: N/A;  1030  . HIP ARTHROPLASTY Left 06/24/2016   Procedure: ARTHROPLASTY BIPOLAR HIP (HEMIARTHROPLASTY);  Surgeon: Altamese Norwalk, MD;  Location: [facility];  Service: Orthopedics;   Laterality: Left;  . INSERT / REPLACE / REMOVE PACEMAKER    . NM MYOVIEW LTD  07/19/2011   normal  . PERMANENT PACEMAKER INSERTION  07/05/2011   Medtronic Adapta dual chamber  . PERMANENT PACEMAKER INSERTION N/A 07/05/2011   Procedure: PERMANENT PACEMAKER INSERTION;  Surgeon: [name], MD;  Location: [facility] CATH LAB;  Service: Cardiovascular;  Laterality: N/A;    There were no vitals filed for this visit.  Subjective Assessment - 11/08/17 1116    Subjective  Pt states that last Thursday, 11/02/17, she went to step up on a curb and when she went to bring her other foot up, she lost her balance and fell. She landed on her back. She went to the ED but was d/c home. She then went back to the ED at [facility] on Saturday, 11/04/17 due to stomach pains/issues and was d/c home yesterday.     Limitations  Walking;Standing;House hold activities    How long can you sit comfortably?  no issues    How long can you stand comfortably?  no real issues, just the standing up from sitting from a couch or soft chair is difficult due to her back pain    How long can you walk comfortably?  10 mins    Patient Stated Goals  be able  to walk better    Currently in Pain?  No/denies            [facility] Adult PT Treatment/Exercise - 11/08/17 0001      Exercises   Exercises  Knee/Hip      Knee/Hip Exercises: Standing   Heel Raises  Both;20 reps    Heel Raises Limitations  heel and toe    Hip Flexion  Both;15 reps    Hip Flexion Limitations  3" holds    Hip Abduction  Both;2 sets;10 reps    Abduction Limitations  RTB    Other Standing Knee Exercises  sidestepping 38ft x2RT, RTB      Balance Exercises - 11/08/17 1138      Balance Exercises: Standing   Tandem Stance  Eyes open;Intermittent upper extremity support   +reaching for #s board x10 reps each   Wall Bumps  Shoulder    Wall Bumps-Shoulders  Eyes opened;Anterior/posterior;15 reps    Cone Rotation Limitations  Cone taps firm x10RT             PT Short Term Goals - 11/01/17 1306      PT SHORT TERM GOAL #1   Title  Pt will score 25/30 or > on the FGA to demo improved dynamic balance and decrease her risk for falls.    Baseline  10/9: 19/30 (was 22/30)    Time  2    Period  Weeks    Status  On-going      PT SHORT TERM GOAL #2   Title  Pt will be able to perform 14 STS during 30sec chair rise test in order to demo improved hip extension strength and overall functional strength.    Baseline  10/9: 7x, was 11x    Time  2    Period  Weeks    Status  On-going        PT Long Term Goals - 11/01/17 1306      PT LONG TERM GOAL #1   Title  Pt's bil hip ext MMT will improve to 4/5 or better in order to maximize balance.    Baseline  10/9: see MMT    Time  4    Period  Weeks    Status  On-going      PT LONG TERM GOAL #2   Title  Pt will be able to perform bil SLS for 20 sec or > in order to demo improved static balance and to maximize her gait on uneven ground and stair ambulation.    Baseline  10/9: R: 10.5sec, L: 12.5sec    Time  4    Period  Weeks    Status  On-going            Plan - 11/08/17 1159    Clinical Impression Statement  Pt returns to therapy after more recent hospital visits and an admission due to a fall and bowel obstruction/blockage. She was not injured during her fall other than some skin abrasions and she was d/c home form [facility] yesterday for the bowel issues and is feeling much better today, just a little weak. Continued with established POC focusing on strengthening and balance. Pt with one moderate posterior LOB during sidestepping which required physical assistance to recover from, otherwise, pt just unsteady requiring contact-min guard assist. Continue as planned, progressing as able.     Rehab Potential  Good    PT Frequency  1x / week    PT Duration  4  weeks    PT Treatment/Interventions  ADLs/Self Care Home Management;Aquatic Therapy;Cryotherapy;Moist Heat;Ultrasound;Gait  training;Stair training;Functional mobility training;Therapeutic activities;Therapeutic exercise;Balance training;Neuromuscular re-education;Patient/family education;Manual techniques;Passive range of motion;Dry needling;Taping    PT Next Visit Plan  functional strengthening, static and dynamic balance activities, progressing as able.    PT Home Exercise Plan  01/26/17: bridging, STS; 1/16: HS stretch in supine, PWR! Sitting; 1/28: seated adductor stretching; 2/13: standing and seated PWR! moves;   8/29 eval: continue previous HEP, SLS    Consulted and Agree with Plan of Care  Patient       Patient will benefit from skilled therapeutic intervention in order to improve the following deficits and impairments:  Decreased balance, Decreased activity tolerance, Decreased strength, Hypomobility, Postural dysfunction, Improper body mechanics, Pain  Visit Diagnosis: Unsteadiness on feet  Other abnormalities of gait and mobility     Problem List Patient Active Problem List   Diagnosis Date Noted  . Anticoagulated   . Epigastric pain   . Diverticulosis   . Melena 11/04/2017  . Atrial fibrillation with RVR ([place]) 09/29/2017  . RBD (REM behavioral disorder) 01/10/2017  . Neurogenic orthostatic hypotension ([place]) 01/10/2017  . Tachy-brady syndrome ([place]) 06/23/2016  . Closed subcapital fracture of left femur ([place]) 06/23/2016  . Orthostatic hypotension due to Parkinson's disease ([place]) 06/23/2016  . PD (Parkinson's disease) ([place]) 04/21/2016  . Hypokalemia 04/21/2016  . Normal coronary arteries 2001 05/08/2014  . Pacemaker 08/10/2012  . Aortic insufficiency 08/10/2012  . Constipation 12/27/2011  . Family hx of colon cancer 12/27/2011  . HTN (hypertension) 07/06/2011  . Hypothyroidism 07/05/2011  . Paroxysmal atrial fibrillation ([place]) 07/05/2011  . Sinus pause, 7 seconds post conversion, S/P MDT pacemaker 07/05/11 07/05/2011  . Dyslipidemia 07/05/2011  . Angina at rest, with rapid AF 07/05/2011        Geraldine Solar PT, DPT  [facility] [address] [place], [place], [address] Phone: [phone]   Fax:  [phone]  Name: Abigail Taylor MRN: [id number] Date of Birth: 03-31-1931

## 2017-11-08 NOTE — Telephone Encounter (Signed)
Mitzie this will have to be with Terri while he is in the office--He doesn't have an appt. For 2 weeks--he isn't even back in 2 weeks--he is overbooked his day back--please make this with Terri with note to ask him to step in during visit to answer questions etc.

## 2017-11-08 NOTE — Therapy (Signed)
[place], [place], [address] Phone: [phone]   Fax:  [phone]  Speech Language Pathology Treatment  Patient Details  Name: Abigail Taylor MRN: [id number] Date of Birth: [birth year] Referring Provider (SLP): Alonza Bogus, DO   Encounter Date: 11/08/2017  End of Session - 11/08/17 1047    Visit Number  3    Number of Visits  5    Date for SLP Re-Evaluation  11/28/17    Authorization Type  Medicare and Generic Commercial, no auth based on medical necessity    SLP Start Time  1035    SLP Stop Time   1115    SLP Time Calculation (min)  40 min    Activity Tolerance  Patient tolerated treatment well       Past Medical History:  Diagnosis Date  . Angina at rest, with the tachycardia 07/05/2011  . Aortic insufficiency 07/05/2011  . Atrial fibrillation ([place])   . Brady-tachy syndrome ([place])    Dual chamber Medtronic pacemaker Adapta  . Chronic back pain   . Constipation 12/27/2011  . Gait instability   . Hyperlipidemia   . Hypothyroidism   . Normal cardiac stress test 2013  . OA (osteoarthritis)   . Pacemaker   . Parkinson's disease ([place])   . Tremor     Past Surgical History:  Procedure Laterality Date  . ABDOMINAL HYSTERECTOMY    . CARDIAC CATHETERIZATION  05/26/1999   normal  . CARDIAC CATHETERIZATION  2001   normal coronary arteries  . cataract    . COLONOSCOPY N/A 11/15/2012   Procedure: COLONOSCOPY;  Surgeon: [name], MD;  [facility];  Service: Endoscopy;  Laterality: N/A;  1030  . HIP ARTHROPLASTY Left 06/24/2016   Procedure: ARTHROPLASTY BIPOLAR HIP (HEMIARTHROPLASTY);  Surgeon: Altamese Raywick, MD;  Location: [facility];  Service: Orthopedics;  Laterality: Left;  . INSERT / REPLACE / REMOVE PACEMAKER    . NM MYOVIEW LTD  07/19/2011   normal  . PERMANENT PACEMAKER INSERTION  07/05/2011   Medtronic Adapta dual chamber  . PERMANENT PACEMAKER INSERTION N/A 07/05/2011   Procedure:  PERMANENT PACEMAKER INSERTION;  Surgeon: [name], MD;  Location: [facility] CATH LAB;  Service: Cardiovascular;  Laterality: N/A;    There were no vitals filed for this visit.  Subjective Assessment - 11/08/17 1045    Subjective  "I was in the hospital."    Currently in Pain?  No/denies        ADULT SLP TREATMENT - 11/08/17 0001      General Information   Behavior/Cognition  Alert;Pleasant mood;Cooperative    Patient Positioning  Upright in chair    Oral care provided  N/A    HPI  Abigail Taylor is an 84 yo woman who is known to SLP outpatient service from previous therapy (January 2019). She is referred for SLP therapy by Dr. Wells Guiles Tat, her movement disorders neurologist.       Treatment Provided   Treatment provided  Cognitive-Linquistic      Pain Assessment   Pain Assessment  No/denies pain      Cognitive-Linquistic Treatment   Treatment focused on  Dysarthria;Voice;Patient/family/caregiver education    Skilled Treatment  SLP provided skilled treatment during vocal function and resonance exercises with an emphasis on breath support, reducing tension, and increasing vocal intensity during structured/modeled tasks.      Progression Toward Goals   Progression toward goals  Progressing toward goals  SLP Short Term Goals - 11/08/17 1048      SLP SHORT TERM GOAL #1   Title  The patient will use a strong, clear voice when communicating with family, friends and/or colleagues resulting in an 80% reduction in requests for repetition.    Time  4    Period  Weeks    Status  On-going      SLP SHORT TERM GOAL #2   Title  Regularly practice voice building/strengthening exercises a minimum of 5 days/week for a 20+ minutes a day.    Baseline  Inconsistent completion at home    Time  4    Period  Weeks    Status  On-going       SLP Long Term Goals - 10/31/17 1838      SLP LONG TERM GOAL #1   Title  Same as short       Plan - 11/08/17 1048    Clinical  Impression Statement  Mild hypokinetic dysarthria persists, with fluctuating presentations throughout the day per Pt. She was admitted to the hospital for a few days and appears more fatigued this date. She had difficulty sustaining /a/ phonation for longer than 10 seconds today and required increased cueing and modeling. Plan to continue to straw phonation and y-buzz next session.    Speech Therapy Frequency  1x /week    Duration  4 weeks    Treatment/Interventions  Cueing hierarchy;SLP instruction and feedback;Compensatory techniques;Compensatory strategies;Internal/external aids;Patient/family education    Potential to Achieve Goals  Good    Potential Considerations  Ability to learn/carryover information    SLP Home Exercise Plan  Pt will complete HEP as assigned to facilitate carryover of treatment strategies and techniques in home environment.    Consulted and Agree with Plan of Care  Patient       Patient will benefit from skilled therapeutic intervention in order to improve the following deficits and impairments:   Dysarthria and anarthria    Problem List Patient Active Problem List   Diagnosis Date Noted  . Anticoagulated   . Epigastric pain   . Diverticulosis   . Melena 11/04/2017  . Atrial fibrillation with RVR ([place]) 09/29/2017  . RBD (REM behavioral disorder) 01/10/2017  . Neurogenic orthostatic hypotension ([place]) 01/10/2017  . Tachy-brady syndrome ([place]) 06/23/2016  . Closed subcapital fracture of left femur ([place]) 06/23/2016  . Orthostatic hypotension due to Parkinson's disease ([place]) 06/23/2016  . PD (Parkinson's disease) ([place]) 04/21/2016  . Hypokalemia 04/21/2016  . Normal coronary arteries 2001 05/08/2014  . Pacemaker 08/10/2012  . Aortic insufficiency 08/10/2012  . Constipation 12/27/2011  . Family hx of colon cancer 12/27/2011  . HTN (hypertension) 07/06/2011  . Hypothyroidism 07/05/2011  . Paroxysmal atrial fibrillation ([place]) 07/05/2011  . Sinus pause, 7 seconds  post conversion, S/P MDT pacemaker 07/05/11 07/05/2011  . Dyslipidemia 07/05/2011  . Angina at rest, with rapid AF 07/05/2011   Thank you,  Genene Churn, [place]  [facility] 11/08/2017, 10:48 AM  [facility] [address] [place], [place], [address] Phone: [phone]   Fax:  [phone]   Name: Abigail Taylor MRN: [id number] Date of Birth: Feb 20, 1931

## 2017-11-09 ENCOUNTER — Encounter: Payer: Self-pay | Admitting: Cardiology

## 2017-11-10 ENCOUNTER — Encounter (HOSPITAL_COMMUNITY): Payer: Self-pay

## 2017-11-10 ENCOUNTER — Ambulatory Visit (HOSPITAL_COMMUNITY): Payer: Medicare Other

## 2017-11-10 DIAGNOSIS — R2681 Unsteadiness on feet: Secondary | ICD-10-CM

## 2017-11-10 DIAGNOSIS — R2689 Other abnormalities of gait and mobility: Secondary | ICD-10-CM

## 2017-11-10 DIAGNOSIS — R471 Dysarthria and anarthria: Secondary | ICD-10-CM | POA: Diagnosis not present

## 2017-11-10 DIAGNOSIS — R293 Abnormal posture: Secondary | ICD-10-CM | POA: Diagnosis not present

## 2017-11-10 DIAGNOSIS — M6281 Muscle weakness (generalized): Secondary | ICD-10-CM | POA: Diagnosis not present

## 2017-11-10 NOTE — Therapy (Signed)
[place], [place], [address] Phone: [phone]   Fax:  [phone]  Physical Therapy Treatment  Patient Details  Name: Abigail Taylor MRN: [id number] Date of Birth: 05/13/1931 Referring Provider (PT): Alonza Bogus, DO   Encounter Date: 11/10/2017  PT End of Session - 11/10/17 1522    Visit Number  4    Number of Visits  11    Date for PT Re-Evaluation  11/22/17    Authorization Type  Medicare (secondary: generic commercial)    Authorization Time Period  09/21/17 to 10/19/17; NEW: 11/01/17 to 11/22/17    Authorization - Visit Number  4    Authorization - Number of Visits  11    PT Start Time  1520    PT Stop Time  1559    PT Time Calculation (min)  39 min    Equipment Utilized During Treatment  Gait belt    Activity Tolerance  Patient tolerated treatment well;No increased pain    Behavior During Therapy  WFL for tasks assessed/performed       Past Medical History:  Diagnosis Date  . Angina at rest, with the tachycardia 07/05/2011  . Aortic insufficiency 07/05/2011  . Atrial fibrillation ([place])   . Brady-tachy syndrome ([place])    Dual chamber Medtronic pacemaker Adapta  . Chronic back pain   . Constipation 12/27/2011  . Gait instability   . Hyperlipidemia   . Hypothyroidism   . Normal cardiac stress test 2013  . OA (osteoarthritis)   . Pacemaker   . Parkinson's disease ([place])   . Tremor     Past Surgical History:  Procedure Laterality Date  . ABDOMINAL HYSTERECTOMY    . CARDIAC CATHETERIZATION  05/26/1999   normal  . CARDIAC CATHETERIZATION  2001   normal coronary arteries  . cataract    . COLONOSCOPY N/A 11/15/2012   Procedure: COLONOSCOPY;  Surgeon: [name], MD;  [facility];  Service: Endoscopy;  Laterality: N/A;  1030  . ESOPHAGOGASTRODUODENOSCOPY (EGD) WITH PROPOFOL N/A 11/06/2017   Procedure: ESOPHAGOGASTRODUODENOSCOPY (EGD) WITH PROPOFOL;  Surgeon: [name], MD;   [facility];  Service: Endoscopy;  Laterality: N/A;  . HIP ARTHROPLASTY Left 06/24/2016   Procedure: ARTHROPLASTY BIPOLAR HIP (HEMIARTHROPLASTY);  Surgeon: Altamese Bascom, MD;  Location: [facility];  Service: Orthopedics;  Laterality: Left;  . INSERT / REPLACE / REMOVE PACEMAKER    . NM MYOVIEW LTD  07/19/2011   normal  . PERMANENT PACEMAKER INSERTION  07/05/2011   Medtronic Adapta dual chamber  . PERMANENT PACEMAKER INSERTION N/A 07/05/2011   Procedure: PERMANENT PACEMAKER INSERTION;  Surgeon: [name], MD;  Location: [facility];  Service: Cardiovascular;  Laterality: N/A;    There were no vitals filed for this visit.  Subjective Assessment - 11/10/17 1522    Subjective  No reports of pain or recent fall.  Stated she continues to have difficulty with balance.      Patient Stated Goals  be able to walk better    Currently in Pain?  No/denies                       [facility] Adult PT Treatment/Exercise - 11/10/17 0001      Exercises   Exercises  Knee/Hip      Knee/Hip Exercises: Standing   Heel Raises  Both;20 reps    Heel Raises Limitations  heel and toe    Hip Flexion  Both;15 reps    Hip Flexion Limitations  3" holds    Other Standing Knee Exercises  sidestepping 73ft x2RT, RTB      Knee/Hip Exercises: Seated   Sit to Sand  10 reps;without UE support          Balance Exercises - 11/10/17 1530      Balance Exercises: Standing   Tandem Stance  Eyes open;3 reps   3rd set on foam   SLS  --   4 cone tapping 10x   Wall Bumps  Shoulder;Hip    Wall Bumps-Shoulders  Eyes opened;Anterior/posterior;15 reps    Numbers 1-15  Static;1 rep   NBOS   Cone Rotation  Foam/compliant surface;Right turn;Left turn    Cone Rotation Limitations  both direcitns          PT Short Term Goals - 11/01/17 1306      PT SHORT TERM GOAL #1   Title  Pt will score 25/30 or > on the FGA to demo improved dynamic balance and decrease her risk for falls.    Baseline   10/9: 19/30 (was 22/30)    Time  2    Period  Weeks    Status  On-going      PT SHORT TERM GOAL #2   Title  Pt will be able to perform 14 STS during 30sec chair rise test in order to demo improved hip extension strength and overall functional strength.    Baseline  10/9: 7x, was 11x    Time  2    Period  Weeks    Status  On-going        PT Long Term Goals - 11/01/17 1306      PT LONG TERM GOAL #1   Title  Pt's bil hip ext MMT will improve to 4/5 or better in order to maximize balance.    Baseline  10/9: see MMT    Time  4    Period  Weeks    Status  On-going      PT LONG TERM GOAL #2   Title  Pt will be able to perform bil SLS for 20 sec or > in order to demo improved static balance and to maximize her gait on uneven ground and stair ambulation.    Baseline  10/9: R: 10.5sec, L: 12.5sec    Time  4    Period  Weeks    Status  On-going            Plan - 11/10/17 1610    Clinical Impression Statement  Session focus with balance training.  Added cone rotation to improve trunk rotation with gait and added dynamic surface wiht tandem stance.  MIn A for safety with all activities this session with constant cueing to improve awareness of posture.  Functional strengthening is progressing well noted by increased speed and ease with STS.      Rehab Potential  Good    PT Frequency  1x / week    PT Duration  4 weeks    PT Treatment/Interventions  ADLs/Self Care Home Management;Aquatic Therapy;Cryotherapy;Moist Heat;Ultrasound;Gait training;Stair training;Functional mobility training;Therapeutic activities;Therapeutic exercise;Balance training;Neuromuscular re-education;Patient/family education;Manual techniques;Passive range of motion;Dry needling;Taping    PT Next Visit Plan  functional strengthening, static and dynamic balance activities, progressing as able.    PT Home Exercise Plan  01/26/17: bridging, STS; 1/16: HS stretch in supine, PWR! Sitting; 1/28: seated adductor stretching;  2/13: standing and seated PWR! moves;   8/29 eval: continue previous  HEP, SLS       Patient will benefit from skilled therapeutic intervention in order to improve the following deficits and impairments:  Decreased balance, Decreased activity tolerance, Decreased strength, Hypomobility, Postural dysfunction, Improper body mechanics, Pain  Visit Diagnosis: Unsteadiness on feet  Other abnormalities of gait and mobility     Problem List Patient Active Problem List   Diagnosis Date Noted  . Anticoagulated   . Epigastric pain   . Diverticulosis   . Melena 11/04/2017  . Atrial fibrillation with RVR ([place]) 09/29/2017  . RBD (REM behavioral disorder) 01/10/2017  . Neurogenic orthostatic hypotension ([place]) 01/10/2017  . Tachy-brady syndrome ([place]) 06/23/2016  . Closed subcapital fracture of left femur ([place]) 06/23/2016  . Orthostatic hypotension due to Parkinson's disease ([place]) 06/23/2016  . PD (Parkinson's disease) ([place]) 04/21/2016  . Hypokalemia 04/21/2016  . Normal coronary arteries 2001 05/08/2014  . Pacemaker 08/10/2012  . Aortic insufficiency 08/10/2012  . Constipation 12/27/2011  . Family hx of colon cancer 12/27/2011  . HTN (hypertension) 07/06/2011  . Hypothyroidism 07/05/2011  . Paroxysmal atrial fibrillation ([place]) 07/05/2011  . Sinus pause, 7 seconds post conversion, S/P MDT pacemaker 07/05/11 07/05/2011  . Dyslipidemia 07/05/2011  . Angina at rest, with rapid AF 07/05/2011   Ihor Austin, [place]; [facility] [phone]  Aldona Lento 11/10/2017, 4:42 PM  [place], [place], [address] Phone: [phone]   Fax:  [phone]  Name: Abigail Taylor MRN: [id number] Date of Birth: 1931/09/22

## 2017-11-13 DIAGNOSIS — E063 Autoimmune thyroiditis: Secondary | ICD-10-CM | POA: Diagnosis not present

## 2017-11-13 DIAGNOSIS — E876 Hypokalemia: Secondary | ICD-10-CM | POA: Diagnosis not present

## 2017-11-13 DIAGNOSIS — K921 Melena: Secondary | ICD-10-CM | POA: Diagnosis not present

## 2017-11-13 DIAGNOSIS — Z681 Body mass index (BMI) 19 or less, adult: Secondary | ICD-10-CM | POA: Diagnosis not present

## 2017-11-14 ENCOUNTER — Ambulatory Visit (HOSPITAL_COMMUNITY): Payer: Medicare Other | Admitting: Physical Therapy

## 2017-11-14 ENCOUNTER — Encounter (HOSPITAL_COMMUNITY): Payer: Self-pay | Admitting: Physical Therapy

## 2017-11-14 DIAGNOSIS — R2689 Other abnormalities of gait and mobility: Secondary | ICD-10-CM

## 2017-11-14 DIAGNOSIS — M6281 Muscle weakness (generalized): Secondary | ICD-10-CM | POA: Diagnosis not present

## 2017-11-14 DIAGNOSIS — R2681 Unsteadiness on feet: Secondary | ICD-10-CM | POA: Diagnosis not present

## 2017-11-14 DIAGNOSIS — R293 Abnormal posture: Secondary | ICD-10-CM | POA: Diagnosis not present

## 2017-11-14 DIAGNOSIS — R471 Dysarthria and anarthria: Secondary | ICD-10-CM | POA: Diagnosis not present

## 2017-11-14 NOTE — Therapy (Signed)
Abigail Taylor, [place], [address] Phone: [phone]   Fax:  [phone]  Physical Therapy Treatment  Patient Details  Name: Abigail Taylor MRN: [id number] Date of Birth: 03/16/1931 Referring Provider (PT): Alonza Bogus, DO   Encounter Date: 11/14/2017  PT End of Session - 11/14/17 1432    Visit Number  5    Number of Visits  11    Date for PT Re-Evaluation  11/22/17    Authorization Type  Medicare (secondary: generic commercial)    Authorization Time Period  09/21/17 to 10/19/17; NEW: 11/01/17 to 11/22/17    Authorization - Visit Number  5    Authorization - Number of Visits  11    PT Start Time  1350    PT Stop Time  1430    PT Time Calculation (min)  40 min    Equipment Utilized During Treatment  Gait belt    Activity Tolerance  Patient tolerated treatment well;No increased pain    Behavior During Therapy  WFL for tasks assessed/performed       Past Medical History:  Diagnosis Date  . Angina at rest, with the tachycardia 07/05/2011  . Aortic insufficiency 07/05/2011  . Atrial fibrillation ([place])   . Brady-tachy syndrome ([place])    Dual chamber Medtronic pacemaker Adapta  . Chronic back pain   . Constipation 12/27/2011  . Gait instability   . Hyperlipidemia   . Hypothyroidism   . Normal cardiac stress test 2013  . OA (osteoarthritis)   . Pacemaker   . Parkinson's disease ([place])   . Tremor     Past Surgical History:  Procedure Laterality Date  . ABDOMINAL HYSTERECTOMY    . CARDIAC CATHETERIZATION  05/26/1999   normal  . CARDIAC CATHETERIZATION  2001   normal coronary arteries  . cataract    . COLONOSCOPY N/A 11/15/2012   Procedure: COLONOSCOPY;  Surgeon: [name], MD;  [facility];  Service: Endoscopy;  Laterality: N/A;  1030  . ESOPHAGOGASTRODUODENOSCOPY (EGD) WITH PROPOFOL N/A 11/06/2017   Procedure: ESOPHAGOGASTRODUODENOSCOPY (EGD) WITH PROPOFOL;  Surgeon: [name], MD;   [facility];  Service: Endoscopy;  Laterality: N/A;  . HIP ARTHROPLASTY Left 06/24/2016   Procedure: ARTHROPLASTY BIPOLAR HIP (HEMIARTHROPLASTY);  Surgeon: [name], MD;  Location: [facility];  Service: Orthopedics;  Laterality: Left;  . INSERT / REPLACE / REMOVE PACEMAKER    . NM MYOVIEW LTD  07/19/2011   normal  . PERMANENT PACEMAKER INSERTION  07/05/2011   Medtronic Adapta dual chamber  . PERMANENT PACEMAKER INSERTION N/A 07/05/2011   Procedure: PERMANENT PACEMAKER INSERTION;  Surgeon: [name], MD;  Location: [facility];  Service: Cardiovascular;  Laterality: N/A;    There were no vitals filed for this visit.  Subjective Assessment - 11/14/17 1353    Subjective  Ms. Biermann states that she is doing some exercise at home but not all the time.  Pt was in the hospital last week due to constipation.      Limitations  Walking;Standing;House hold activities    How long can you sit comfortably?  no issues    How long can you stand comfortably?  no real issues, just the standing up from sitting from a couch or soft chair is difficult due to her back pain    How long can you walk comfortably?  10 mins    Patient Stated Goals  be able to walk better  [facility] Adult PT Treatment/Exercise - 11/14/17 0001      Exercises   Exercises  Knee/Hip      Knee/Hip Exercises: Standing   Other Standing Knee Exercises  PWR UP, PWR shift, PWR twist    attempted pwr step but unable.           Balance Exercises - 11/14/17 1412      Balance Exercises: Standing   Standing Eyes Opened  Narrow base of support (BOS);Head turns;Solid surface;5 reps    SLS with Vectors  Solid surface;3 reps;5 reps    Wall Bumps  Shoulder    Wall Bumps-Shoulders  Eyes opened;10 reps    Gait with Head Turns  Forward;Limitations   no head turns working on swinging UE with walking x 150 ft.    Marching Limitations  10   with opposite arm raise    Other Standing Exercises  --   sitting on wobble  disc opposite arm/leg raise    Overall Comments  --   wall arch, v lift off x 5 each          PT Short Term Goals - 11/01/17 1306      PT SHORT TERM GOAL #1   Title  Pt will score 25/30 or > on the FGA to demo improved dynamic balance and decrease her risk for falls.    Baseline  10/9: 19/30 (was 22/30)    Time  2    Period  Weeks    Status  On-going      PT SHORT TERM GOAL #2   Title  Pt will be able to perform 14 STS during 30sec chair rise test in order to demo improved hip extension strength and overall functional strength.    Baseline  10/9: 7x, was 11x    Time  2    Period  Weeks    Status  On-going        PT Long Term Goals - 11/01/17 1306      PT LONG TERM GOAL #1   Title  Pt's bil hip ext MMT will improve to 4/5 or better in order to maximize balance.    Baseline  10/9: see MMT    Time  4    Period  Weeks    Status  On-going      PT LONG TERM GOAL #2   Title  Pt will be able to perform bil SLS for 20 sec or > in order to demo improved static balance and to maximize her gait on uneven ground and stair ambulation.    Baseline  10/9: R: 10.5sec, L: 12.5sec    Time  4    Period  Weeks    Status  On-going            Plan - 11/14/17 1433    Clinical Impression Statement  Session focused on strengthening postural mm, PWR activity for improved motion due to hx of Parkinsons and reciprocal motion to promote mobilty and balance with pt needing minimum assist with activties.     Rehab Potential  Good    PT Frequency  1x / week    PT Duration  4 weeks    PT Treatment/Interventions  ADLs/Self Care Home Management;Aquatic Therapy;Cryotherapy;Moist Heat;Ultrasound;Gait training;Stair training;Functional mobility training;Therapeutic activities;Therapeutic exercise;Balance training;Neuromuscular re-education;Patient/family education;Manual techniques;Passive range of motion;Dry needling;Taping    PT Next Visit Plan  Continue with PWR attempt PWR step again as this  was to difficult for pt.     PT  Home Exercise Plan  01/26/17: bridging, STS; 1/16: HS stretch in supine, PWR! Sitting; 1/28: seated adductor stretching; 2/13: standing and seated PWR! moves;   8/29 eval: continue previous HEP, SLS       Patient will benefit from skilled therapeutic intervention in order to improve the following deficits and impairments:  Decreased balance, Decreased activity tolerance, Decreased strength, Hypomobility, Postural dysfunction, Improper body mechanics, Pain  Visit Diagnosis: Unsteadiness on feet  Other abnormalities of gait and mobility  Muscle weakness (generalized)  Abnormal posture     Problem List Patient Active Problem List   Diagnosis Date Noted  . Anticoagulated   . Epigastric pain   . Diverticulosis   . Melena 11/04/2017  . Atrial fibrillation with RVR ([place]) 09/29/2017  . RBD (REM behavioral disorder) 01/10/2017  . Neurogenic orthostatic hypotension ([place]) 01/10/2017  . Tachy-brady syndrome ([place]) 06/23/2016  . Closed subcapital fracture of left femur ([place]) 06/23/2016  . Orthostatic hypotension due to Parkinson's disease ([place]) 06/23/2016  . PD (Parkinson's disease) ([place]) 04/21/2016  . Hypokalemia 04/21/2016  . Normal coronary arteries 2001 05/08/2014  . Pacemaker 08/10/2012  . Aortic insufficiency 08/10/2012  . Constipation 12/27/2011  . Family hx of colon cancer 12/27/2011  . HTN (hypertension) 07/06/2011  . Hypothyroidism 07/05/2011  . Paroxysmal atrial fibrillation ([place]) 07/05/2011  . Sinus pause, 7 seconds post conversion, S/P MDT pacemaker 07/05/11 07/05/2011  . Dyslipidemia 07/05/2011  . Angina at rest, with rapid AF 07/05/2011    Rayetta Humphrey, PT CLT [phone] 11/14/2017, 2:35 PM  [place], [place], [address] Phone: [phone]   Fax:  [phone]  Name: BRISA AUTH MRN: [id number] Date of Birth: October 15, 1931

## 2017-11-15 NOTE — Addendum Note (Signed)
Addended by: Geralyn Corwin on: 11/15/2017 08:41 AM   Modules accepted: Orders

## 2017-11-16 ENCOUNTER — Ambulatory Visit (HOSPITAL_COMMUNITY): Payer: Medicare Other | Admitting: Speech Pathology

## 2017-11-16 ENCOUNTER — Encounter (HOSPITAL_COMMUNITY): Payer: Self-pay | Admitting: Speech Pathology

## 2017-11-16 ENCOUNTER — Ambulatory Visit (HOSPITAL_COMMUNITY): Payer: Medicare Other

## 2017-11-16 ENCOUNTER — Encounter (HOSPITAL_COMMUNITY): Payer: Self-pay

## 2017-11-16 DIAGNOSIS — R2689 Other abnormalities of gait and mobility: Secondary | ICD-10-CM

## 2017-11-16 DIAGNOSIS — R293 Abnormal posture: Secondary | ICD-10-CM | POA: Diagnosis not present

## 2017-11-16 DIAGNOSIS — R2681 Unsteadiness on feet: Secondary | ICD-10-CM

## 2017-11-16 DIAGNOSIS — R471 Dysarthria and anarthria: Secondary | ICD-10-CM

## 2017-11-16 DIAGNOSIS — M6281 Muscle weakness (generalized): Secondary | ICD-10-CM | POA: Diagnosis not present

## 2017-11-16 NOTE — Therapy (Signed)
[place], [place], [address] Phone: [phone]   Fax:  [phone]  Speech Language Pathology Treatment  Patient Details  Name: Abigail Taylor MRN: [id number] Date of Birth: 1931-08-22 Referring Provider (SLP): Alonza Bogus, DO   Encounter Date: 11/16/2017  End of Session - 11/16/17 1448    Visit Number  4    Number of Visits  5    Date for SLP Re-Evaluation  11/28/17    Authorization Type  Medicare and Generic Commercial, no auth based on medical necessity    SLP Start Time  1437    SLP Stop Time   1515    SLP Time Calculation (min)  38 min    Activity Tolerance  Patient tolerated treatment well       Past Medical History:  Diagnosis Date  . Angina at rest, with the tachycardia 07/05/2011  . Aortic insufficiency 07/05/2011  . Atrial fibrillation ([place])   . Brady-tachy syndrome ([place])    Dual chamber Medtronic pacemaker Adapta  . Chronic back pain   . Constipation 12/27/2011  . Gait instability   . Hyperlipidemia   . Hypothyroidism   . Normal cardiac stress test 2013  . OA (osteoarthritis)   . Pacemaker   . Parkinson's disease ([place])   . Tremor     Past Surgical History:  Procedure Laterality Date  . ABDOMINAL HYSTERECTOMY    . CARDIAC CATHETERIZATION  05/26/1999   normal  . CARDIAC CATHETERIZATION  2001   normal coronary arteries  . cataract    . COLONOSCOPY N/A 11/15/2012   Procedure: COLONOSCOPY;  Surgeon: [name], MD;  [facility];  Service: Endoscopy;  Laterality: N/A;  1030  . ESOPHAGOGASTRODUODENOSCOPY (EGD) WITH PROPOFOL N/A 11/06/2017   Procedure: ESOPHAGOGASTRODUODENOSCOPY (EGD) WITH PROPOFOL;  Surgeon: [name], MD;  [facility];  Service: Endoscopy;  Laterality: N/A;  . HIP ARTHROPLASTY Left 06/24/2016   Procedure: ARTHROPLASTY BIPOLAR HIP (HEMIARTHROPLASTY);  Surgeon: Altamese B[place]er Hill, MD;  Location: [facility];  Service: Orthopedics;  Laterality: Left;  .  INSERT / REPLACE / REMOVE PACEMAKER    . NM MYOVIEW LTD  07/19/2011   normal  . PERMANENT PACEMAKER INSERTION  07/05/2011   Medtronic Adapta dual chamber  . PERMANENT PACEMAKER INSERTION N/A 07/05/2011   Procedure: PERMANENT PACEMAKER INSERTION;  Surgeon: [name], MD;  Location: [facility];  Service: Cardiovascular;  Laterality: N/A;    There were no vitals filed for this visit.  Subjective Assessment - 11/16/17 1442    Subjective  "I don't feel up to par."    Currently in Pain?  No/denies            ADULT SLP TREATMENT - 11/16/17 1442      General Information   Behavior/Cognition  Alert;Pleasant mood;Cooperative    Patient Positioning  Upright in chair    Oral care provided  N/A    HPI  [name] is an 84 yo woman who is known to SLP outpatient service from previous therapy (January 2019). She is referred for SLP therapy by Dr. Wells Guiles Tat, her movement disorders neurologist.       Treatment Provided   Treatment provided  Cognitive-Linquistic      Pain Assessment   Pain Assessment  No/denies pain      Cognitive-Linquistic Treatment   Treatment focused on  Dysarthria;Voice;Patient/family/caregiver education    Skilled Treatment   SLP provided skilled treatment during vocal function  and resonance exercises with an emphasis on breath support, reducing tension, and increasing vocal intensity during structured/modeled tasks.       Progression Toward Goals   Progression toward goals  Progressing toward goals         SLP Short Term Goals - 11/16/17 1517      SLP SHORT TERM GOAL #1   Title  The patient will use a strong, clear voice when communicating with family, friends and/or colleagues resulting in an 80% reduction in requests for repetition.    Time  4    Period  Weeks    Status  On-going      SLP SHORT TERM GOAL #2   Title  Regularly practice voice building/strengthening exercises a minimum of 5 days/week for a 20+ minutes a day.    Baseline   Inconsistent completion at home    Time  4    Period  Weeks    Status  On-going       SLP Long Term Goals - 10/31/17 1838      SLP LONG TERM GOAL #1   Title  Same as short       Plan - 11/16/17 1449    Clinical Impression Statement Mild hypokinetic dysarthria persists, with fluctuating presentations throughout the day per Pt. She demonstrated increased ability to sustain /a/ today for an average of ~16 seconds with min cues from SLP. SLP further reinforced education regarding replacing throat clearing with a single cough or sip of water. Plan to continue to straw phonation and y-buzz next session.     Speech Therapy Frequency  1x /week    Duration  4 weeks    Treatment/Interventions  Cueing hierarchy;SLP instruction and feedback;Compensatory techniques;Compensatory strategies;Internal/external aids;Patient/family education    Potential to Achieve Goals  Good    Potential Considerations  Ability to learn/carryover information    SLP Home Exercise Plan  Pt will complete HEP as assigned to facilitate carryover of treatment strategies and techniques in home environment.    Consulted and Agree with Plan of Care  Patient       Patient will benefit from skilled therapeutic intervention in order to improve the following deficits and impairments:   Dysarthria and anarthria    Problem List Patient Active Problem List   Diagnosis Date Noted  . Anticoagulated   . Epigastric pain   . Diverticulosis   . Melena 11/04/2017  . Atrial fibrillation with RVR ([place]) 09/29/2017  . RBD (REM behavioral disorder) 01/10/2017  . Neurogenic orthostatic hypotension ([place]) 01/10/2017  . Tachy-brady syndrome ([place]) 06/23/2016  . Closed subcapital fracture of left femur ([place]) 06/23/2016  . Orthostatic hypotension due to Parkinson's disease ([place]) 06/23/2016  . PD (Parkinson's disease) ([place]) 04/21/2016  . Hypokalemia 04/21/2016  . Normal coronary arteries 2001 05/08/2014  . Pacemaker 08/10/2012  . Aortic  insufficiency 08/10/2012  . Constipation 12/27/2011  . Family hx of colon cancer 12/27/2011  . HTN (hypertension) 07/06/2011  . Hypothyroidism 07/05/2011  . Paroxysmal atrial fibrillation ([place]) 07/05/2011  . Sinus pause, 7 seconds post conversion, S/P MDT pacemaker 07/05/11 07/05/2011  . Dyslipidemia 07/05/2011  . Angina at rest, with rapid AF 07/05/2011   Thank you,  Genene Churn, [place]  [facility] 11/16/2017, 3:18 PM  [facility] [address] [place], [place], [address] Phone: [phone]   Fax:  [phone]   Name: CYLIE DOR MRN: [id number] Date of Birth: 10-09-1931

## 2017-11-16 NOTE — Therapy (Signed)
[place], [place], [address] Phone: [phone]   Fax:  [phone]  Physical Therapy Treatment  Patient Details  Name: Abigail Taylor MRN: [id number] Date of Birth: July 03, 1931 Referring Provider (PT): Alonza Bogus, DO   Encounter Date: 11/16/2017  PT End of Session - 11/16/17 1517    Visit Number  6    Number of Visits  11    Date for PT Re-Evaluation  11/22/17    Authorization Type  Medicare (secondary: generic commercial)    Authorization Time Period  09/21/17 to 10/19/17; NEW: 11/01/17 to 11/22/17    Authorization - Visit Number  6    Authorization - Number of Visits  11    PT Start Time  5465    PT Stop Time  1604   EOS 5' on Nustep, not included wiht charges   PT Time Calculation (min)  47 min    Equipment Utilized During Treatment  Gait belt    Activity Tolerance  Patient tolerated treatment well    Behavior During Therapy  [facility] for tasks assessed/performed       Past Medical History:  Diagnosis Date  . Angina at rest, with the tachycardia 07/05/2011  . Aortic insufficiency 07/05/2011  . Atrial fibrillation ([place])   . Brady-tachy syndrome ([place])    Dual chamber Medtronic pacemaker Adapta  . Chronic back pain   . Constipation 12/27/2011  . Gait instability   . Hyperlipidemia   . Hypothyroidism   . Normal cardiac stress test 2013  . OA (osteoarthritis)   . Pacemaker   . Parkinson's disease ([place])   . Tremor     Past Surgical History:  Procedure Laterality Date  . ABDOMINAL HYSTERECTOMY    . CARDIAC CATHETERIZATION  05/26/1999   normal  . CARDIAC CATHETERIZATION  2001   normal coronary arteries  . cataract    . COLONOSCOPY N/A 11/15/2012   Procedure: COLONOSCOPY;  Surgeon: [name], MD;  [facility];  Service: Endoscopy;  Laterality: N/A;  1030  . ESOPHAGOGASTRODUODENOSCOPY (EGD) WITH PROPOFOL N/A 11/06/2017   Procedure: ESOPHAGOGASTRODUODENOSCOPY (EGD) WITH PROPOFOL;  Surgeon:  [name], MD;  [facility];  Service: Endoscopy;  Laterality: N/A;  . HIP ARTHROPLASTY Left 06/24/2016   Procedure: ARTHROPLASTY BIPOLAR HIP (HEMIARTHROPLASTY);  Surgeon: Altamese [place], MD;  Location: [facility];  Service: Orthopedics;  Laterality: Left;  . INSERT / REPLACE / REMOVE PACEMAKER    . NM MYOVIEW LTD  07/19/2011   normal  . PERMANENT PACEMAKER INSERTION  07/05/2011   Medtronic Adapta dual chamber  . PERMANENT PACEMAKER INSERTION N/A 07/05/2011   Procedure: PERMANENT PACEMAKER INSERTION;  Surgeon: [name], MD;  Location: [facility] CATH LAB;  Service: Cardiovascular;  Laterality: N/A;    There were no vitals filed for this visit.  Subjective Assessment - 11/16/17 1516    Subjective  Pt stated they found blood in stool, going back to MD next Tuesday.  Currently pain free    Patient Stated Goals  be able to walk better    Currently in Pain?  No/denies                       [facility] Adult PT Treatment/Exercise - 11/16/17 0001      Exercises   Exercises  Knee/Hip      Knee/Hip Exercises: Aerobic   Nustep  5' at EOS wiht UE/LE for coordination  Knee/Hip Exercises: Standing   Other Standing Knee Exercises  PWR UP, PWR shift, PWR twist         PWR [facility]) - 11/16/17 1811    PWR! Twist  10 reps    PWR Step  10 reps with moderate cueing        Balance Exercises - 11/16/17 1537      Balance Exercises: Standing   SLS with Vectors  Solid surface;3 reps;5 reps    Wall Bumps  Shoulder    Wall Bumps-Shoulders  Eyes opened;10 reps    Gait with Head Turns  Forward;Limitations   3RTx 226 working on arm swings, no head turns   [facility] Rotation  Foam/compliant surface;Right turn;Left turn    [facility] Rotation Limitations  both direcitns    Marching Limitations  10   wiht opposite arm raise   Other Standing Exercises  --   sitting on dynadisc opposite arm/leg raise   Overall Comments  --   wall arch, v lift off with toe raises 10x         PT Short  Term Goals - 11/01/17 1306      PT SHORT TERM GOAL #1   Title  Pt will score 25/30 or > on the FGA to demo improved dynamic balance and decrease her risk for falls.    Baseline  10/9: 19/30 (was 22/30)    Time  2    Period  Weeks    Status  On-going      PT SHORT TERM GOAL #2   Title  Pt will be able to perform 14 STS during 30sec chair rise test in order to demo improved hip extension strength and overall functional strength.    Baseline  10/9: 7x, was 11x    Time  2    Period  Weeks    Status  On-going        PT Long Term Goals - 11/01/17 1306      PT LONG TERM GOAL #1   Title  Pt's bil hip ext MMT will improve to 4/5 or better in order to maximize balance.    Baseline  10/9: see MMT    Time  4    Period  Weeks    Status  On-going      PT LONG TERM GOAL #2   Title  Pt will be able to perform bil SLS for 20 sec or > in order to demo improved static balance and to maximize her gait on uneven ground and stair ambulation.    Baseline  10/9: R: 10.5sec, L: 12.5sec    Time  4    Period  Weeks    Status  On-going            Plan - 11/16/17 1603    Clinical Impression Statement  Continued session focus on postural strengthening and PWR technqiues to improve reciprocal UE/LE coordination and balance training.  Min A for safety with balance.  Pt with extreme difficutly with PWR step activities, able to complete UE or LE with min cueing but required verbal and tactile cueing to incorporate both.  Also cueing for arm swing with gait, added dowel rod wiht gait training and Nustep with UE/LE at EOS (not included with charges).      Rehab Potential  Good    PT Frequency  1x / week    PT Duration  4 weeks    PT Treatment/Interventions  ADLs/Self Care Home Management;Aquatic Therapy;Cryotherapy;Moist Heat;Ultrasound;Gait training;Stair training;Functional  mobility training;Therapeutic activities;Therapeutic exercise;Balance training;Neuromuscular re-education;Patient/family  education;Manual techniques;Passive range of motion;Dry needling;Taping    PT Next Visit Plan  Continue with PWR attempt PWR step again as this was to difficult for pt.     PT Home Exercise Plan  01/26/17: bridging, STS; 1/16: HS stretch in supine, PWR! Sitting; 1/28: seated adductor stretching; 2/13: standing and seated PWR! moves;   8/29 eval: continue previous HEP, SLS       Patient will benefit from skilled therapeutic intervention in order to improve the following deficits and impairments:  Decreased balance, Decreased activity tolerance, Decreased strength, Hypomobility, Postural dysfunction, Improper body mechanics, Pain  Visit Diagnosis: Unsteadiness on feet  Other abnormalities of gait and mobility     Problem List Patient Active Problem List   Diagnosis Date Noted  . Anticoagulated   . Epigastric pain   . Diverticulosis   . Melena 11/04/2017  . Atrial fibrillation with RVR ([place]) 09/29/2017  . RBD (REM behavioral disorder) 01/10/2017  . Neurogenic orthostatic hypotension ([place]) 01/10/2017  . Tachy-brady syndrome ([place]) 06/23/2016  . Closed subcapital fracture of left femur ([place]) 06/23/2016  . Orthostatic hypotension due to Parkinson's disease ([place]) 06/23/2016  . PD (Parkinson's disease) ([place]) 04/21/2016  . Hypokalemia 04/21/2016  . Normal coronary arteries 2001 05/08/2014  . Pacemaker 08/10/2012  . Aortic insufficiency 08/10/2012  . Constipation 12/27/2011  . Family hx of colon cancer 12/27/2011  . HTN (hypertension) 07/06/2011  . Hypothyroidism 07/05/2011  . Paroxysmal atrial fibrillation ([place]) 07/05/2011  . Sinus pause, 7 seconds post conversion, S/P MDT pacemaker 07/05/11 07/05/2011  . Dyslipidemia 07/05/2011  . Angina at rest, with rapid AF 07/05/2011   Ihor Austin, [place]; CBIS [phone]  Aldona Lento 11/16/2017, [place], [place], [address] Phone: [phone]   Fax:   [phone]  Name: HAWA HENLY MRN: [id number] Date of Birth: 05-13-1931

## 2017-11-21 ENCOUNTER — Encounter (INDEPENDENT_AMBULATORY_CARE_PROVIDER_SITE_OTHER): Payer: Self-pay | Admitting: Internal Medicine

## 2017-11-21 ENCOUNTER — Ambulatory Visit (INDEPENDENT_AMBULATORY_CARE_PROVIDER_SITE_OTHER): Payer: Medicare Other | Admitting: Internal Medicine

## 2017-11-21 VITALS — BP 120/74 | HR 68 | Temp 97.4°F | Ht 66.0 in | Wt 119.2 lb

## 2017-11-21 DIAGNOSIS — K5900 Constipation, unspecified: Secondary | ICD-10-CM | POA: Diagnosis not present

## 2017-11-21 DIAGNOSIS — K921 Melena: Secondary | ICD-10-CM

## 2017-11-21 LAB — HEMOGLOBIN AND HEMATOCRIT, BLOOD
HCT: 44.8 % (ref 35.0–45.0)
Hemoglobin: 14.5 g/dL (ref 11.7–15.5)

## 2017-11-21 NOTE — Progress Notes (Signed)
Subjective:    Patient ID: Abigail Taylor, female    DOB: [birth year], 84 y.o.   MRN: [id number]  HPI Here today for f/u. [facility] in [facility] with abdominal pian and melena. Had taken [place] recently, but none in week before admission.   CT of the abdomen yesterday demonstrated diverticulosis; nothing acute. Hemoglobin remained stable during admission. No previous hx of GI bleeding in past.  Last colonoscopy in 2014 (high risk-- diverticulosis and hemorrhoids)          Underwent and EGD 11/06/2017 which was normal.  She tells me she is doing good. Her stools are brown in color. Has not seen a black stool since discharge. Appetite is not good. She has lost 7 pounds since her last OV in 2017.    Hx of atrial fib and maintained on Eliquis.  CBC    Component Value Date/Time   WBC 7.4 11/07/2017 0430   RBC 5.21 (H) 11/07/2017 0430   HGB 14.0 11/07/2017 0430   HCT 45.2 11/07/2017 0430   PLT 202 11/07/2017 0430   MCV 86.8 11/07/2017 0430   MCH 26.9 11/07/2017 0430   MCHC 31.0 11/07/2017 0430   RDW 13.8 11/07/2017 0430   LYMPHSABS 1.2 11/04/2017 1952   MONOABS 0.9 11/04/2017 1952   EOSABS 0.2 11/04/2017 1952   BASOSABS 0.0 11/04/2017 1952     Review of Systems Past Medical History:  Diagnosis Date  . Angina at rest, with the tachycardia 07/05/2011  . Aortic insufficiency 07/05/2011  . Atrial fibrillation ([place])   . Brady-tachy syndrome ([place])    Dual chamber Medtronic pacemaker Adapta  . Chronic back pain   . Constipation 12/27/2011  . Gait instability   . Hyperlipidemia   . Hypothyroidism   . Normal cardiac stress test 2013  . OA (osteoarthritis)   . Pacemaker   . Parkinson's disease ([place])   . Tremor     Past Surgical History:  Procedure Laterality Date  . ABDOMINAL HYSTERECTOMY    . CARDIAC CATHETERIZATION  05/26/1999   normal  . CARDIAC CATHETERIZATION  2001   normal coronary arteries  . cataract    . COLONOSCOPY N/A 11/15/2012   Procedure:  COLONOSCOPY;  Surgeon: [name], MD;  [facility];  Service: Endoscopy;  Laterality: N/A;  1030  . ESOPHAGOGASTRODUODENOSCOPY (EGD) WITH PROPOFOL N/A 11/06/2017   Procedure: ESOPHAGOGASTRODUODENOSCOPY (EGD) WITH PROPOFOL;  Surgeon: [name], MD;  [facility];  Service: Endoscopy;  Laterality: N/A;  . HIP ARTHROPLASTY Left 06/24/2016   Procedure: ARTHROPLASTY BIPOLAR HIP (HEMIARTHROPLASTY);  Surgeon: Altamese Boyd, MD;  Location: [facility];  Service: Orthopedics;  Laterality: Left;  . INSERT / REPLACE / REMOVE PACEMAKER    . NM MYOVIEW LTD  07/19/2011   normal  . PERMANENT PACEMAKER INSERTION  07/05/2011   Medtronic Adapta dual chamber  . PERMANENT PACEMAKER INSERTION N/A 07/05/2011   Procedure: PERMANENT PACEMAKER INSERTION;  Surgeon: [name], MD;  Location: [facility] CATH LAB;  Service: Cardiovascular;  Laterality: N/A;    No Known Allergies  Current Outpatient Medications on File Prior to Visit  Medication Sig Dispense Refill  . amiodarone (PACERONE) 200 MG tablet Take 200 mg daily with largest meal (Patient taking differently: Take 200 mg by mouth daily. Take 200 mg daily with largest meal) 90 tablet 3  . bisacodyl (BISACODYL) 5 MG EC tablet Take 10 mg by mouth 2 (two) times daily as needed for moderate constipation.    Marland Kitchen  carbidopa-levodopa (SINEMET IR) 25-100 MG tablet Take 1 tablet by mouth 3 (three) times daily with meals. 90 tablet 2  . ELIQUIS 2.5 MG TABS tablet TAKE 1 TABLET BY MOUTH TWICE A DAY (Patient taking differently: Take 2.5 mg by mouth 2 (two) times daily. ) 180 tablet 1  . fludrocortisone (FLORINEF) 0.1mg /mL SUSP Take 0.1 mg by mouth daily. (pill)    . levothyroxine (SYNTHROID, LEVOTHROID) 75 MCG tablet Take 75 mcg by mouth daily before breakfast.    . linaCLOtide (LINZESS PO) Take by mouth.    . rosuvastatin (CRESTOR) 10 MG tablet Take 10 mg by mouth at bedtime. Reported on 07/09/2015    . XIIDRA 5 % SOLN Place 1 drop into the left eye 2  (two) times daily.      No current facility-administered medications on file prior to visit.         Objective:   Physical Exam Blood pressure 120/74, pulse 68, temperature (!) 97.4 F (36.3 C), height 5\' 6"  (1.676 m), weight 119 lb 3.2 oz (54.1 kg). Alert and oriented. Skin warm and dry. Oral mucosa is moist.   . Sclera anicteric, conjunctivae is pink. Thyroid not enlarged. No cervical lymphadenopathy. Lungs clear. Heart regular rate and rhythm.  Abdomen is soft. Bowel sounds are positive. No hepatomegaly. No abdominal masses felt. No tenderness.  No edema to lower extremities.  Rectal: No masses. Guaiac negative.           Assessment & Plan:  Melena. Hemoglobin has remained stable. Will get a H and H today. OV in 1 year.  Constipation. Samples of Linzess 255mcg x 4 boxes

## 2017-11-21 NOTE — Patient Instructions (Addendum)
H and H today.  Samples of Linzess. OV in 1 year.

## 2017-11-22 ENCOUNTER — Ambulatory Visit (HOSPITAL_COMMUNITY): Payer: Medicare Other | Admitting: Speech Pathology

## 2017-11-22 ENCOUNTER — Encounter (HOSPITAL_COMMUNITY): Payer: Self-pay

## 2017-11-22 ENCOUNTER — Encounter (HOSPITAL_COMMUNITY): Payer: Self-pay | Admitting: Speech Pathology

## 2017-11-22 ENCOUNTER — Ambulatory Visit (HOSPITAL_COMMUNITY): Payer: Medicare Other

## 2017-11-22 DIAGNOSIS — R471 Dysarthria and anarthria: Secondary | ICD-10-CM

## 2017-11-22 DIAGNOSIS — R2689 Other abnormalities of gait and mobility: Secondary | ICD-10-CM | POA: Diagnosis not present

## 2017-11-22 DIAGNOSIS — R293 Abnormal posture: Secondary | ICD-10-CM | POA: Diagnosis not present

## 2017-11-22 DIAGNOSIS — R2681 Unsteadiness on feet: Secondary | ICD-10-CM | POA: Diagnosis not present

## 2017-11-22 DIAGNOSIS — M6281 Muscle weakness (generalized): Secondary | ICD-10-CM | POA: Diagnosis not present

## 2017-11-22 NOTE — Therapy (Signed)
[place], [place], [address] Phone: [phone]   Fax:  [phone]   Progress Note Reporting Period 11/01/17 to 11/22/17  See note below for Objective Data and Assessment of Progress/Goals.    Physical Therapy Treatment  Patient Details  Name: Abigail Taylor MRN: [id number] Date of Birth: [birth year] Referring Provider (PT): Alonza Bogus, DO   Encounter Date: 11/22/2017  PT End of Session - 11/22/17 0954    Visit Number  7    Number of Visits  13    Date for PT Re-Evaluation  12/13/17    Authorization Type  Medicare (secondary: generic commercial)    Authorization Time Period  09/21/17 to 10/19/17; NEW: 11/01/17 to 11/22/17; NEW: 11/22/17 to 12/13/17    Authorization - Visit Number  7    Authorization - Number of Visits  13    PT Start Time  0951    PT Stop Time  1030    PT Time Calculation (min)  39 min    Equipment Utilized During Treatment  Gait belt    Activity Tolerance  Patient tolerated treatment well    Behavior During Therapy  WFL for tasks assessed/performed       Past Medical History:  Diagnosis Date  . Angina at rest, with the tachycardia 07/05/2011  . Aortic insufficiency 07/05/2011  . Atrial fibrillation ([place])   . Brady-tachy syndrome ([place])    Dual chamber Medtronic pacemaker Adapta  . Chronic back pain   . Constipation 12/27/2011  . Gait instability   . Hyperlipidemia   . Hypothyroidism   . Normal cardiac stress test 2013  . OA (osteoarthritis)   . Pacemaker   . Parkinson's disease ([place])   . Tremor     Past Surgical History:  Procedure Laterality Date  . ABDOMINAL HYSTERECTOMY    . CARDIAC CATHETERIZATION  05/26/1999   normal  . CARDIAC CATHETERIZATION  2001   normal coronary arteries  . cataract    . COLONOSCOPY N/A 11/15/2012   Procedure: COLONOSCOPY;  Surgeon: [name], MD;  [facility];  Service: Endoscopy;  Laterality: N/A;  1030  .  ESOPHAGOGASTRODUODENOSCOPY (EGD) WITH PROPOFOL N/A 11/06/2017   Procedure: ESOPHAGOGASTRODUODENOSCOPY (EGD) WITH PROPOFOL;  Surgeon: [name], MD;  [facility];  Service: Endoscopy;  Laterality: N/A;  . HIP ARTHROPLASTY Left 06/24/2016   Procedure: ARTHROPLASTY BIPOLAR HIP (HEMIARTHROPLASTY);  Surgeon: Altamese Camden[place]on[place]Ga[place], MD;  Location: [facility];  Service: Orthopedics;  Laterality: Left;  . INSERT / REPLACE / REMOVE PACEMAKER    . NM MYOVIEW LTD  07/19/2011   normal  . PERMANENT PACEMAKER INSERTION  07/05/2011   Medtronic Adapta dual chamber  . PERMANENT PACEMAKER INSERTION N/A 07/05/2011   Procedure: PERMANENT PACEMAKER INSERTION;  Surgeon: [name], MD;  Location: [facility]ATH LAB;  Service: Cardiovascular;  Laterality: N/A;    There were no vitals filed for this visit.  Subjective Assessment - 11/22/17 0955    Subjective  Pt reports that her L leg doesn't want to move this morning. No pain, it's just when she gets up to walk it takes a second    Patient Stated Goals  be able to walk better    Currently in Pain?  No/denies         [facility] PT Assessment - 11/22/17 0001      Assessment   Medical Diagnosis  Parkinson's Disease    Referring Provider (PT)  Wells Guiles Tat, DO  Next MD Visit  6 months    Prior Therapy  yes for L THA (d/c'd 08/2016), and Parkinson's earlier this year      Functional Tests   Functional tests  Sit to Stand      Sit to Stand   Comments  30 sec chair rise test: 11x   was 7x     Strength   Right Hip Extension  4-/5   was 3+   Left Hip Extension  3+/5   was 3+     Balance   Balance Assessed  Yes      Static Standing Balance   Static Standing - Balance Support  No upper extremity supported    Static Standing Balance -  Activities   Single Leg Stance - Right Leg;Single Leg Stance - Left Leg    Static Standing - Comment/# of Minutes  R: 14sec L: 9.2sec   was 10.5sec on R, 12sec on L     Functional Gait  Assessment   Gait Level Surface   Walks 20 ft in less than 5.5 sec, no assistive devices, good speed, no evidence for imbalance, normal gait pattern, deviates no more than 6 in outside of the 12 in walkway width.    Change in Gait Speed  Able to smoothly change walking speed without loss of balance or gait deviation. Deviate no more than 6 in outside of the 12 in walkway width.    Gait with Horizontal Head Turns  Performs head turns smoothly with slight change in gait velocity (eg, minor disruption to smooth gait path), deviates 6-10 in outside 12 in walkway width, or uses an assistive device.    Gait with Vertical Head Turns  Performs head turns with no change in gait. Deviates no more than 6 in outside 12 in walkway width.    Gait and Pivot Turn  Pivot turns safely in greater than 3 sec and stops with no loss of balance, or pivot turns safely within 3 sec and stops with mild imbalance, requires small steps to catch balance.    Step Over Obstacle  Is able to step over one shoe box (4.5 in total height) without changing gait speed. No evidence of imbalance.    Gait with Narrow Base of Support  Ambulates 4-7 steps.    Gait with Eyes Closed  Walks 20 ft, slow speed, abnormal gait pattern, evidence for imbalance, deviates 10-15 in outside 12 in walkway width. Requires more than 9 sec to ambulate 20 ft.    Ambulating Backwards  Walks 20 ft, uses assistive device, slower speed, mild gait deviations, deviates 6-10 in outside 12 in walkway width.    Steps  Alternating feet, must use rail.    Total Score  21    FGA comment:  was 19/30                   [facility] Adult PT Treatment/Exercise - 11/22/17 0001      Knee/Hip Exercises: Standing   Heel Raises  Both;20 reps    Heel Raises Limitations  heel and toe slope          Balance Exercises - 11/22/17 1025      Balance Exercises: Standing   Tandem Stance  Eyes open;Foam/compliant surface;Intermittent upper extremity support;3 reps;20 secs   +R/L head turns   Marching  Limitations  x10 on foam        PT Education - 11/22/17 1017    Education provided  Yes  Education Details  reassessment findings, continue HEP    Person(s) Educated  Patient    Methods  Explanation;Demonstration    Comprehension  Verbalized understanding;Returned demonstration       PT Short Term Goals - 11/22/17 0956      PT SHORT TERM GOAL #1   Title  Pt will score 25/30 or > on the FGA to demo improved dynamic balance and decrease her risk for falls.    Baseline  10/30: 21/30, was 19    Time  2    Period  Weeks    Status  On-going      PT SHORT TERM GOAL #2   Title  Pt will be able to perform 14 STS during 30sec chair rise test in order to demo improved hip extension strength and overall functional strength.    Baseline  10/30: 11x, was 7x    Time  2    Period  Weeks    Status  On-going        PT Long Term Goals - 11/22/17 0956      PT LONG TERM GOAL #1   Title  Pt's bil hip ext MMT will improve to 4/5 or better in order to maximize balance.    Baseline  10/30: see MMT    Time  4    Period  Weeks    Status  On-going      PT LONG TERM GOAL #2   Title  Pt will be able to perform bil SLS for 20 sec or > in order to demo improved static balance and to maximize her gait on uneven ground and stair ambulation.    Baseline  10/9: R: 14sec, L: 9.2sec    Time  4    Period  Weeks    Status  On-going            Plan - 11/22/17 1031    Clinical Impression Statement  PT reassessed pt's goals and outcome measures this date. Since her last reassessment in early October, pt has made good progress towards her goals and is essentially back to where she was at during her initial eval in August. Dynamic balance is still her biggest limitation AEB FGA score, but she's also limited in her hip ext strength as well. She has also had 1 fall since her initial evaluation when she tripped stepping up onto a curb. Pt needs continued skilled PT to address these remaining limitations  in order to further improve her balance and decrease her risk for falls. Rest of session focused on balance. Continue as planne,d progressing as able.     Rehab Potential  Good    PT Frequency  1x / week    PT Duration  4 weeks    PT Treatment/Interventions  ADLs/Self Care Home Management;Aquatic Therapy;Cryotherapy;Moist Heat;Ultrasound;Gait training;Stair training;Functional mobility training;Therapeutic activities;Therapeutic exercise;Balance training;Neuromuscular re-education;Patient/family education;Manual techniques;Passive range of motion;Dry needling;Taping    PT Next Visit Plan  Continue with PWR attempt PWR step again as this was to difficult for pt.     PT Home Exercise Plan  01/26/17: bridging, STS; 1/16: HS stretch in supine, PWR! Sitting; 1/28: seated adductor stretching; 2/13: standing and seated PWR! moves;   8/29 eval: continue previous HEP, SLS       Patient will benefit from skilled therapeutic intervention in order to improve the following deficits and impairments:  Decreased balance, Decreased activity tolerance, Decreased strength, Hypomobility, Postural dysfunction, Improper body mechanics, Pain  Visit Diagnosis: Unsteadiness on feet -  Plan: PT plan of care cert/re-cert  Other abnormalities of gait and mobility - Plan: PT plan of care cert/re-cert     Problem List Patient Active Problem List   Diagnosis Date Noted  . Anticoagulated   . Epigastric pain   . Diverticulosis   . Melena 11/04/2017  . Atrial fibrillation with RVR ([place]) 09/29/2017  . RBD (REM behavioral disorder) 01/10/2017  . Neurogenic orthostatic hypotension ([place]) 01/10/2017  . Tachy-brady syndrome ([place]) 06/23/2016  . Closed subcapital fracture of left femur ([place]) 06/23/2016  . Orthostatic hypotension due to Parkinson's disease ([place]) 06/23/2016  . PD (Parkinson's disease) ([place]) 04/21/2016  . Hypokalemia 04/21/2016  . Normal coronary arteries 2001 05/08/2014  . Pacemaker 08/10/2012  . Aortic  insufficiency 08/10/2012  . Constipation 12/27/2011  . Family hx of colon cancer 12/27/2011  . HTN (hypertension) 07/06/2011  . Hypothyroidism 07/05/2011  . Paroxysmal atrial fibrillation ([place]) 07/05/2011  . Sinus pause, 7 seconds post conversion, S/P MDT pacemaker 07/05/11 07/05/2011  . Dyslipidemia 07/05/2011  . Angina at rest, with rapid AF 07/05/2011       Geraldine Solar PT, DPT  [facility] [address] [place], [place], [address] Phone: [phone]   Fax:  [phone]  Name: GIOVANNA KEMMERER MRN: [id number] Date of Birth: 1931/03/01

## 2017-11-22 NOTE — Therapy (Signed)
[facility], [place], [address] Phone: [phone]   Fax:  [phone]  Speech Language Pathology Treatment  Patient Details  Name: Abigail Taylor Date of Birth: [birth year] Referring Provider (SLP): Alonza Bogus, DO   Encounter Date: 11/22/2017  End of Session - 11/22/17 1118    Visit Number  5    Number of Visits  5    Date for SLP Re-Evaluation  11/28/17    Authorization Type  Medicare and Generic Commercial, no auth based on medical necessity    SLP Start Time  1030    SLP Stop Time   1115    SLP Time Calculation (min)  45 min    Activity Tolerance  Patient tolerated treatment well       Past Medical History:  Diagnosis Date  . Angina at rest, with the tachycardia 07/05/2011  . Aortic insufficiency 07/05/2011  . Atrial fibrillation ([place])   . Brady-tachy syndrome ([place])    Dual chamber Medtronic pacemaker Adapta  . Chronic back pain   . Constipation 12/27/2011  . Gait instability   . Hyperlipidemia   . Hypothyroidism   . Normal cardiac stress test 2013  . OA (osteoarthritis)   . Pacemaker   . Parkinson's disease ([place])   . Tremor     Past Surgical History:  Procedure Laterality Date  . ABDOMINAL HYSTERECTOMY    . CARDIAC CATHETERIZATION  05/26/1999   normal  . CARDIAC CATHETERIZATION  2001   normal coronary arteries  . cataract    . COLONOSCOPY N/A 11/15/2012   Procedure: COLONOSCOPY;  Surgeon: [name], MD;  [facility];  Service: Endoscopy;  Laterality: N/A;  1030  . ESOPHAGOGASTRODUODENOSCOPY (EGD) WITH PROPOFOL N/A 11/06/2017   Procedure: ESOPHAGOGASTRODUODENOSCOPY (EGD) WITH PROPOFOL;  Surgeon: [name], MD;  [facility];  Service: Endoscopy;  Laterality: N/A;  . HIP ARTHROPLASTY Left 06/24/2016   Procedure: ARTHROPLASTY BIPOLAR HIP (HEMIARTHROPLASTY);  Surgeon: Altamese Kingston, MD;  Location: [facility];  Service: Orthopedics;  Laterality: Left;  .  INSERT / REPLACE / REMOVE PACEMAKER    . NM MYOVIEW LTD  07/19/2011   normal  . PERMANENT PACEMAKER INSERTION  07/05/2011   Medtronic Adapta dual chamber  . PERMANENT PACEMAKER INSERTION N/A 07/05/2011   Procedure: PERMANENT PACEMAKER INSERTION;  Surgeon: [name], MD;  Location: [facility];  Service: Cardiovascular;  Laterality: N/A;    There were no vitals filed for this visit.  Subjective Assessment - 11/22/17 1115    Subjective  "I think my speech is pretty good."    Currently in Pain?  No/denies       ADULT SLP TREATMENT - 11/22/17 0001      General Information   Behavior/Cognition  Alert;Pleasant mood;Cooperative    Patient Positioning  Upright in chair    Oral care provided  N/A    HPI  Abigail Taylor is an 84 yo woman who is known to SLP outpatient service from previous therapy (January 2019). She is referred for SLP therapy by Dr. Wells Guiles Tat, her movement disorders neurologist.       Treatment Provided   Treatment provided  Cognitive-Linquistic      Pain Assessment   Pain Assessment  No/denies pain      Cognitive-Linquistic Treatment   Treatment focused on  Dysarthria;Voice;Patient/family/caregiver education    Skilled Treatment   SLP provided skilled treatment during vocal function and resonance exercises with  an emphasis on breath support, reducing tension, and increasing vocal intensity during structured/modeled tasks.       Assessment / Recommendations / Plan   Plan  Discharge SLP treatment due to (comment);All goals met      Progression Toward Goals   Progression toward goals  Goals met, education completed, patient discharged from [facility] - 11/22/17 Cut Off #1   Title  The patient will use a strong, clear voice when communicating with family, friends and/or colleagues resulting in an 80% reduction in requests for repetition.    Time  4    Period  Weeks    Status  Achieved      SLP SHORT TERM  GOAL #2   Title  Regularly practice voice building/strengthening exercises a minimum of 5 days/week for a 20+ minutes a day.    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 Pt has participated in SLP therapy in the past and may benefit from SLP "tune ups" in the future pending course of disease progression. Abigail Taylor has a written HEP which she should continue to complete on a daily basis going forward. She has had good success in increasing vocal intensity when repeating functional phrases tailored to her specific wants/needs, however she continues to have difficulty translating to spontaneous conversations. She needs reminders to take a breath and project in order to increase loudness and lower pitch. Pt reports that her speech intelligibility at home fluctuates from day to day, but is functional for the most part. Pt will be discharged from skilled SLP services at this time, but may benefit from additional short term therapy over the next few years. Pt in agreement with plan for discharge.     Speech Therapy Frequency  1x /week    Duration  4 weeks    Treatment/Interventions  Cueing hierarchy;SLP instruction and feedback;Compensatory techniques;Compensatory strategies;Internal/external aids;Patient/family education    Potential to Achieve Goals  Good    Potential Considerations  Ability to learn/carryover information    SLP Home Exercise Plan  Pt will complete HEP as assigned to facilitate carryover of treatment strategies and techniques in home environment.    Consulted and Agree with Plan of Care  Patient       Patient will benefit from skilled therapeutic  intervention in order to improve the following deficits and impairments:   Dysarthria and anarthria    Problem List Patient Active Problem List   Diagnosis Date Noted  . Anticoagulated   . Epigastric pain   . Diverticulosis   . Melena 11/04/2017  . Atrial fibrillation with RVR ([place]) 09/29/2017  . RBD (REM behavioral disorder) 01/10/2017  . Neurogenic orthostatic hypotension ([place]) 01/10/2017  . Tachy-brady syndrome ([place]) 06/23/2016  . Closed subcapital fracture of left femur ([place]) 06/23/2016  . Orthostatic hypotension due to Parkinson's disease ([place]) 06/23/2016  . PD (Parkinson's disease) ([place]) 04/21/2016  . Hypokalemia 04/21/2016  . Normal coronary  arteries 2001 05/08/2014  . Pacemaker 08/10/2012  . Aortic insufficiency 08/10/2012  . Constipation 12/27/2011  . Family hx of colon cancer 12/27/2011  . HTN (hypertension) 07/06/2011  . Hypothyroidism 07/05/2011  . Paroxysmal atrial fibrillation ([place]) 07/05/2011  . Sinus pause, 7 seconds post conversion, S/P MDT pacemaker 07/05/11 07/05/2011  . Dyslipidemia 07/05/2011  . Angina at rest, with rapid AF 07/05/2011   SPEECH THERAPY DISCHARGE SUMMARY  Visits from Start of Care: 5  Current functional level related to goals / functional outcomes: Goals partially met, see above   Remaining deficits: Mild hypokinetic dysarthria   Education / Equipment: HEP provided and encouraged Pt to complete daily  Plan: Patient agrees to discharge.  Patient goals were partially met. Patient is being discharged due to being pleased with the current functional level.  ?????         Thank you,  Genene Churn, [place]   [facility] 11/22/2017, 11:30 AM  [place], [place], [address] Phone: [phone]   Fax:  [phone]   Name: Abigail Taylor Date of Birth: 05/22/1931

## 2017-11-27 DIAGNOSIS — H5203 Hypermetropia, bilateral: Secondary | ICD-10-CM | POA: Diagnosis not present

## 2017-11-27 DIAGNOSIS — H04123 Dry eye syndrome of bilateral lacrimal glands: Secondary | ICD-10-CM | POA: Diagnosis not present

## 2017-11-28 ENCOUNTER — Encounter (HOSPITAL_COMMUNITY): Payer: Self-pay

## 2017-11-28 ENCOUNTER — Ambulatory Visit (HOSPITAL_COMMUNITY): Payer: Medicare Other | Attending: Neurology

## 2017-11-28 DIAGNOSIS — M6281 Muscle weakness (generalized): Secondary | ICD-10-CM | POA: Diagnosis not present

## 2017-11-28 DIAGNOSIS — R2689 Other abnormalities of gait and mobility: Secondary | ICD-10-CM | POA: Insufficient documentation

## 2017-11-28 DIAGNOSIS — R2681 Unsteadiness on feet: Secondary | ICD-10-CM | POA: Diagnosis not present

## 2017-11-28 DIAGNOSIS — R293 Abnormal posture: Secondary | ICD-10-CM | POA: Insufficient documentation

## 2017-11-28 NOTE — Therapy (Signed)
[facility] [place], [place], [address] Phone: [phone]   Fax:  [phone]  Physical Therapy Treatment  Patient Details  Name: Abigail Taylor MRN: [id number] Date of Birth: Oct 04, 1931 Referring Provider (PT): Alonza Bogus, DO   Encounter Date: 11/28/2017  PT End of Session - 11/28/17 1301    Visit Number  8    Number of Visits  13    Date for PT Re-Evaluation  12/13/17    Authorization Type  Medicare (secondary: generic commercial)    Authorization Time Period  09/21/17 to 10/19/17; NEW: 11/01/17 to 11/22/17; NEW: 11/22/17 to 12/13/17    Authorization - Visit Number  8    Authorization - Number of Visits  13    PT Start Time  3220    PT Stop Time  1337    PT Time Calculation (min)  39 min    Equipment Utilized During Treatment  Gait belt    Activity Tolerance  Patient tolerated treatment well    Behavior During Therapy  [facility] for tasks assessed/performed       Past Medical History:  Diagnosis Date  . Angina at rest, with the tachycardia 07/05/2011  . Aortic insufficiency 07/05/2011  . Atrial fibrillation ([place])   . Brady-tachy syndrome ([place])    Dual chamber Medtronic pacemaker Adapta  . Chronic back pain   . Constipation 12/27/2011  . Gait instability   . Hyperlipidemia   . Hypothyroidism   . Normal cardiac stress test 2013  . OA (osteoarthritis)   . Pacemaker   . Parkinson's disease ([place])   . Tremor     Past Surgical History:  Procedure Laterality Date  . ABDOMINAL HYSTERECTOMY    . CARDIAC CATHETERIZATION  05/26/1999   normal  . CARDIAC CATHETERIZATION  2001   normal coronary arteries  . cataract    . COLONOSCOPY N/A 11/15/2012   Procedure: COLONOSCOPY;  Surgeon: [name], MD;  [facility];  Service: Endoscopy;  Laterality: N/A;  1030  . ESOPHAGOGASTRODUODENOSCOPY (EGD) WITH PROPOFOL N/A 11/06/2017   Procedure: ESOPHAGOGASTRODUODENOSCOPY (EGD) WITH PROPOFOL;  Surgeon: [name],  MD;  [facility];  Service: Endoscopy;  Laterality: N/A;  . HIP ARTHROPLASTY Left 06/24/2016   Procedure: ARTHROPLASTY BIPOLAR HIP (HEMIARTHROPLASTY);  Surgeon: Altamese [place], MD;  Location: [facility];  Service: Orthopedics;  Laterality: Left;  . INSERT / REPLACE / REMOVE PACEMAKER    . NM MYOVIEW LTD  07/19/2011   normal  . PERMANENT PACEMAKER INSERTION  07/05/2011   Medtronic Adapta dual chamber  . PERMANENT PACEMAKER INSERTION N/A 07/05/2011   Procedure: PERMANENT PACEMAKER INSERTION;  Surgeon: [name], MD;  Location: [facility]ATH [facility];  Service: Cardiovascular;  Laterality: N/A;    There were no vitals filed for this visit.  Subjective Assessment - 11/28/17 1301    Subjective  Pt states she feels fine today but she isn't going to run a marathon.    Patient Stated Goals  be able to walk better    Currently in Pain?  No/denies             [facility] Adult PT Treatment/Exercise - 11/28/17 0001      Exercises   Exercises  Knee/Hip      Knee/Hip Exercises: Standing   Heel Raises  Both;20 reps    Heel Raises Limitations  heel and toe slope    Hip Abduction  Both;2 sets;10 reps    Abduction Limitations  RTB  Hip Extension  Both;2 sets;10 reps    Extension Limitations  RTB, diagonals    Other Standing Knee Exercises  sidestepping 45ft x2RT RTB       Balance Exercises - 11/28/17 1314      Balance Exercises: Standing   Tandem Stance  Eyes open;Foam/compliant surface;Intermittent upper extremity support   +UEflex and R/L head turns x10reps each   Tandem Gait  Forward;Retro;2 reps   CGA - min A for steadiness   Sidestepping  Foam/compliant support;2 reps    Marching Limitations  cone taps foam x10RT BLE             PT Education - 11/28/17 1301    Education provided  Yes    Education Details  exercise technique, continue HEP    Person(s) Educated  Patient    Methods  Explanation;Demonstration    Comprehension  Verbalized understanding;Returned demonstration        PT Short Term Goals - 11/22/17 0956      PT SHORT TERM GOAL #1   Title  Pt will score 25/30 or > on the FGA to demo improved dynamic balance and decrease her risk for falls.    Baseline  10/30: 21/30, was 19    Time  2    Period  Weeks    Status  On-going      PT SHORT TERM GOAL #2   Title  Pt will be able to perform 14 STS during 30sec chair rise test in order to demo improved hip extension strength and overall functional strength.    Baseline  10/30: 11x, was 7x    Time  2    Period  Weeks    Status  On-going        PT Long Term Goals - 11/22/17 0956      PT LONG TERM GOAL #1   Title  Pt's bil hip ext MMT will improve to 4/5 or better in order to maximize balance.    Baseline  10/30: see MMT    Time  4    Period  Weeks    Status  On-going      PT LONG TERM GOAL #2   Title  Pt will be able to perform bil SLS for 20 sec or > in order to demo improved static balance and to maximize her gait on uneven ground and stair ambulation.    Baseline  10/9: R: 14sec, L: 9.2sec    Time  4    Period  Weeks    Status  On-going            Plan - 11/28/17 1337    Clinical Impression Statement  Continued with established POC focusing on functional strengthening and dynamic balance. Pt requiring min cues throughout for form, no reports of increased pain during. Pt generally unsteady during balance and required CGA-min A for steadiness, mainly during fwd/retro tandem gait. No increased pain reported. Continue as planned, progressing as able.     Rehab Potential  Good    PT Frequency  1x / week    PT Duration  4 weeks    PT Treatment/Interventions  ADLs/Self Care Home Management;Aquatic Therapy;Cryotherapy;Moist Heat;Ultrasound;Gait training;Stair training;Functional mobility training;Therapeutic activities;Therapeutic exercise;Balance training;Neuromuscular re-education;Patient/family education;Manual techniques;Passive range of motion;Dry needling;Taping    PT Next Visit Plan   Continue with PWR attempt PWR step again as this was to difficult for pt.     PT Home Exercise Plan  01/26/17: bridging, STS; 1/16: HS stretch in supine,  PWR! Sitting; 1/28: seated adductor stretching; 2/13: standing and seated PWR! moves;   8/29 eval: continue previous HEP, SLS    Consulted and Agree with Plan of Care  Patient       Patient will benefit from skilled therapeutic intervention in order to improve the following deficits and impairments:  Decreased balance, Decreased activity tolerance, Decreased strength, Hypomobility, Postural dysfunction, Improper body mechanics, Pain  Visit Diagnosis: Unsteadiness on feet  Other abnormalities of gait and mobility     Problem List Patient Active Problem List   Diagnosis Date Noted  . Anticoagulated   . Epigastric pain   . Diverticulosis   . Melena 11/04/2017  . Atrial fibrillation with RVR ([place]) 09/29/2017  . RBD (REM behavioral disorder) 01/10/2017  . Neurogenic orthostatic hypotension ([place]) 01/10/2017  . Tachy-brady syndrome ([place]) 06/23/2016  . Closed subcapital fracture of left femur ([place]) 06/23/2016  . Orthostatic hypotension due to Parkinson's disease ([place]) 06/23/2016  . PD (Parkinson's disease) ([place]) 04/21/2016  . Hypokalemia 04/21/2016  . Normal coronary arteries 2001 05/08/2014  . Pacemaker 08/10/2012  . Aortic insufficiency 08/10/2012  . Constipation 12/27/2011  . Family hx of colon cancer 12/27/2011  . HTN (hypertension) 07/06/2011  . Hypothyroidism 07/05/2011  . Paroxysmal atrial fibrillation ([place]) 07/05/2011  . Sinus pause, 7 seconds post conversion, S/P MDT pacemaker 07/05/11 07/05/2011  . Dyslipidemia 07/05/2011  . Angina at rest, with rapid AF 07/05/2011       Geraldine Solar PT, DPT  [facility] [address] [place], [place], [address] Phone: [phone]   Fax:  [phone]  Name: Abigail Taylor MRN: [id number] Date of Birth: [birth year]

## 2017-12-01 ENCOUNTER — Encounter (HOSPITAL_COMMUNITY): Payer: Self-pay

## 2017-12-01 ENCOUNTER — Ambulatory Visit (HOSPITAL_COMMUNITY): Payer: Medicare Other

## 2017-12-01 DIAGNOSIS — R2689 Other abnormalities of gait and mobility: Secondary | ICD-10-CM | POA: Diagnosis not present

## 2017-12-01 DIAGNOSIS — R2681 Unsteadiness on feet: Secondary | ICD-10-CM | POA: Diagnosis not present

## 2017-12-01 DIAGNOSIS — R293 Abnormal posture: Secondary | ICD-10-CM | POA: Diagnosis not present

## 2017-12-01 DIAGNOSIS — M6281 Muscle weakness (generalized): Secondary | ICD-10-CM | POA: Diagnosis not present

## 2017-12-01 NOTE — Therapy (Signed)
[facility] [place], [place], [address] Phone: [phone]   Fax:  [phone]  Physical Therapy Treatment  Patient Details  Name: Abigail Taylor MRN: [id number] Date of Birth: [birth year] Referring Provider (PT): Alonza Bogus, DO   Encounter Date: 12/01/2017  PT End of Session - 12/01/17 1035    Visit Number  9    Number of Visits  13    Date for PT Re-Evaluation  12/13/17    Authorization Type  Medicare (secondary: generic commercial)    Authorization Time Period  09/21/17 to 10/19/17; NEW: 11/01/17 to 11/22/17; NEW: 11/22/17 to 12/13/17    Authorization - Visit Number  9    Authorization - Number of Visits  13    PT Start Time  1034    PT Stop Time  1113    PT Time Calculation (min)  39 min    Equipment Utilized During Treatment  Gait belt    Activity Tolerance  Patient tolerated treatment well    Behavior During Therapy  [facility] for tasks assessed/performed       Past Medical History:  Diagnosis Date  . Angina at rest, with the tachycardia 07/05/2011  . Aortic insufficiency 07/05/2011  . Atrial fibrillation ([place])   . Brady-tachy syndrome ([place])    Dual chamber Medtronic pacemaker Adapta  . Chronic back pain   . Constipation 12/27/2011  . Gait instability   . Hyperlipidemia   . Hypothyroidism   . Normal cardiac stress test 2013  . OA (osteoarthritis)   . Pacemaker   . Parkinson's disease ([place])   . Tremor     Past Surgical History:  Procedure Laterality Date  . ABDOMINAL HYSTERECTOMY    . CARDIAC CATHETERIZATION  05/26/1999   normal  . CARDIAC CATHETERIZATION  2001   normal coronary arteries  . cataract    . COLONOSCOPY N/A 11/15/2012   Procedure: COLONOSCOPY;  Surgeon: [name], MD;  [facility];  Service: Endoscopy;  Laterality: N/A;  1030  . ESOPHAGOGASTRODUODENOSCOPY (EGD) WITH PROPOFOL N/A 11/06/2017   Procedure: ESOPHAGOGASTRODUODENOSCOPY (EGD) WITH PROPOFOL;  Surgeon: [name],  MD;  [facility];  Service: Endoscopy;  Laterality: N/A;  . HIP ARTHROPLASTY Left 06/24/2016   Procedure: ARTHROPLASTY BIPOLAR HIP (HEMIARTHROPLASTY);  Surgeon: Altamese [place], MD;  Location: [facility];  Service: Orthopedics;  Laterality: Left;  . INSERT / REPLACE / REMOVE PACEMAKER    . NM MYOVIEW LTD  07/19/2011   normal  . PERMANENT PACEMAKER INSERTION  07/05/2011   Medtronic Adapta dual chamber  . PERMANENT PACEMAKER INSERTION N/A 07/05/2011   Procedure: PERMANENT PACEMAKER INSERTION;  Surgeon: [name], MD;  Location: [facility] CATH [facility];  Service: Cardiovascular;  Laterality: N/A;    There were no vitals filed for this visit.  Subjective Assessment - 12/01/17 1035    Subjective  Pt states she laid down to go to sleep and then she had to use the restroom. Her husband pulled her up and when she got to the restroom she got off balance and fell. She reports bumping her head on the door but denies any n/v, grogginess or weakness. She states her back is a little sore but not bad. She did not go to the hospital and states she does not want to go. PT educated her on s/s of head trauma and told her to go to the ED immediately if she feels any of them and she verbalized understanding.  Patient Stated Goals  be able to walk better    Currently in Pain?  No/denies           [facility] Adult PT Treatment/Exercise - 12/01/17 0001      Exercises   Exercises  Knee/Hip      Knee/Hip Exercises: Standing   Heel Raises  Both;20 reps    Heel Raises Limitations  heel and toe slope    Other Standing Knee Exercises  sidestepping 54ft x2RT RTB       Balance Exercises - 12/01/17 1102      Balance Exercises: Standing   Tandem Gait  Forward;Intermittent upper extremity support;3 reps   over 6" hurdles   Cone Rotation Limitations  x5 cones, bil tandem stance front foot on disc, rear foot on airex x2RT    Lift / Chop Limitations  bil tandem stance foam +balloon taps x15 reps each foot forward     Other Standing Exercises  bil tandem stance front foot disc, rear foot foam +UE flexion 3# bar x10 reps each            PT Education - 12/01/17 1042    Education provided  Yes    Education Details  s/s of head trauma/subdural hematoma and ot go to the ED immediately if notices any    Person(s) Educated  Patient    Methods  Explanation;Demonstration    Comprehension  Verbalized understanding;Returned demonstration       PT Short Term Goals - 11/22/17 0956      PT SHORT TERM GOAL #1   Title  Pt will score 25/30 or > on the FGA to demo improved dynamic balance and decrease her risk for falls.    Baseline  10/30: 21/30, was 19    Time  2    Period  Weeks    Status  On-going      PT SHORT TERM GOAL #2   Title  Pt will be able to perform 14 STS during 30sec chair rise test in order to demo improved hip extension strength and overall functional strength.    Baseline  10/30: 11x, was 7x    Time  2    Period  Weeks    Status  On-going        PT Long Term Goals - 11/22/17 0956      PT LONG TERM GOAL #1   Title  Pt's bil hip ext MMT will improve to 4/5 or better in order to maximize balance.    Baseline  10/30: see MMT    Time  4    Period  Weeks    Status  On-going      PT LONG TERM GOAL #2   Title  Pt will be able to perform bil SLS for 20 sec or > in order to demo improved static balance and to maximize her gait on uneven ground and stair ambulation.    Baseline  10/9: R: 14sec, L: 9.2sec    Time  4    Period  Weeks    Status  On-going            Plan - 12/01/17 1114    Clinical Impression Statement  Pt reporting fall last night when trying to go to the restroom; she reports falling onto her butt/back and bumping her head against her door. She denies any HA, n/v, weakness, or other s/s of head trauma/subdural hematoma; she was educated on these and educated to go to the ER  immediately if any of these arise. Today's session focused on dynamic balance and hip  strengthening. Pt challenged with tandem stance on compliant surfaces, especially with UE movements outside BOS. She was challenged with balloon taps on tandem stance foam. Continue to challenge pt's balance and resume PWR in order to reduce risk for falls.     Rehab Potential  Good    PT Frequency  1x / week    PT Duration  4 weeks    PT Treatment/Interventions  ADLs/Self Care Home Management;Aquatic Therapy;Cryotherapy;Moist Heat;Ultrasound;Gait training;Stair training;Functional mobility training;Therapeutic activities;Therapeutic exercise;Balance training;Neuromuscular re-education;Patient/family education;Manual techniques;Passive range of motion;Dry needling;Taping    PT Next Visit Plan  Continue with PWR attempt PWR step again as this was to difficult for pt. cotninue to challenge balance    PT Home Exercise Plan  01/26/17: bridging, STS; 1/16: HS stretch in supine, PWR! Sitting; 1/28: seated adductor stretching; 2/13: standing and seated PWR! moves;   8/29 eval: continue previous HEP, SLS    Consulted and Agree with Plan of Care  Patient       Patient will benefit from skilled therapeutic intervention in order to improve the following deficits and impairments:  Decreased balance, Decreased activity tolerance, Decreased strength, Hypomobility, Postural dysfunction, Improper body mechanics, Pain  Visit Diagnosis: Unsteadiness on feet  Other abnormalities of gait and mobility     Problem List Patient Active Problem List   Diagnosis Date Noted  . Anticoagulated   . Epigastric pain   . Diverticulosis   . Melena 11/04/2017  . Atrial fibrillation with RVR ([place]) 09/29/2017  . RBD (REM behavioral disorder) 01/10/2017  . Neurogenic orthostatic hypotension ([place]) 01/10/2017  . Tachy-brady syndrome ([place]) 06/23/2016  . Closed subcapital fracture of left femur ([place]) 06/23/2016  . Orthostatic hypotension due to Parkinson's disease ([place]) 06/23/2016  . PD (Parkinson's disease) ([place]) 04/21/2016   . Hypokalemia 04/21/2016  . Normal coronary arteries 2001 05/08/2014  . Pacemaker 08/10/2012  . Aortic insufficiency 08/10/2012  . Constipation 12/27/2011  . Family hx of colon cancer 12/27/2011  . HTN (hypertension) 07/06/2011  . Hypothyroidism 07/05/2011  . Paroxysmal atrial fibrillation ([place]) 07/05/2011  . Sinus pause, 7 seconds post conversion, S/P MDT pacemaker 07/05/11 07/05/2011  . Dyslipidemia 07/05/2011  . Angina at rest, with rapid AF 07/05/2011        Geraldine Solar PT, DPT  [facility] [address] [place], [place], [address] Phone: [phone]   Fax:  [phone]  Name: Abigail Taylor MRN: [id number] Date of Birth: 1931/02/15

## 2017-12-05 ENCOUNTER — Telehealth (HOSPITAL_COMMUNITY): Payer: Self-pay | Admitting: Internal Medicine

## 2017-12-05 ENCOUNTER — Ambulatory Visit (HOSPITAL_COMMUNITY): Payer: Medicare Other

## 2017-12-05 NOTE — Telephone Encounter (Signed)
12/05/17  Pt called to cx said that she had fallen and is hurting.

## 2017-12-07 ENCOUNTER — Ambulatory Visit (HOSPITAL_COMMUNITY): Payer: Medicare Other

## 2017-12-07 ENCOUNTER — Telehealth (HOSPITAL_COMMUNITY): Payer: Self-pay

## 2017-12-07 NOTE — Telephone Encounter (Signed)
Pt caled to confrim that her apptment was cx by the phone tree. I confrimed that her apptment today was cx and she will come in Tues. NF 12/07/17

## 2017-12-12 ENCOUNTER — Encounter (HOSPITAL_COMMUNITY): Payer: Self-pay

## 2017-12-12 ENCOUNTER — Ambulatory Visit (HOSPITAL_COMMUNITY): Payer: Medicare Other

## 2017-12-12 DIAGNOSIS — R2681 Unsteadiness on feet: Secondary | ICD-10-CM

## 2017-12-12 DIAGNOSIS — R2689 Other abnormalities of gait and mobility: Secondary | ICD-10-CM

## 2017-12-12 DIAGNOSIS — M6281 Muscle weakness (generalized): Secondary | ICD-10-CM

## 2017-12-12 DIAGNOSIS — R293 Abnormal posture: Secondary | ICD-10-CM | POA: Diagnosis not present

## 2017-12-12 LAB — CUP PACEART REMOTE DEVICE CHECK
Battery Voltage: 2.79 V
Brady Statistic AP VP Percent: 0 %
Brady Statistic AP VS Percent: 36 %
Brady Statistic AS VS Percent: 64 %
Date Time Interrogation Session: 20191010162226
Implantable Lead Implant Date: 20130611
Lead Channel Impedance Value: 430 Ohm
Lead Channel Impedance Value: 628 Ohm
Lead Channel Pacing Threshold Amplitude: 0.875 V
Lead Channel Setting Sensing Sensitivity: 5.6 mV
MDC IDC LEAD IMPLANT DT: 20130611
MDC IDC LEAD LOCATION: 753859
MDC IDC LEAD LOCATION: 753860
MDC IDC MSMT BATTERY IMPEDANCE: 401 Ohm
MDC IDC MSMT BATTERY REMAINING LONGEVITY: 116 mo
MDC IDC MSMT LEADCHNL RA PACING THRESHOLD AMPLITUDE: 0.75 V
MDC IDC MSMT LEADCHNL RA PACING THRESHOLD PULSEWIDTH: 0.4 ms
MDC IDC MSMT LEADCHNL RV PACING THRESHOLD PULSEWIDTH: 0.4 ms
MDC IDC PG IMPLANT DT: 20130611
MDC IDC SET LEADCHNL RA PACING AMPLITUDE: 1.5 V
MDC IDC SET LEADCHNL RV PACING AMPLITUDE: 2 V
MDC IDC SET LEADCHNL RV PACING PULSEWIDTH: 0.4 ms
MDC IDC STAT BRADY AS VP PERCENT: 0 %

## 2017-12-12 NOTE — Therapy (Signed)
[place], [place], [address] Phone: [phone]   Fax:  [phone]  Physical Therapy Treatment/Re-Evaluation  Patient Details  Name: Abigail Taylor MRN: [id number] Date of Birth: 01/20/1932 Referring Provider (PT): Alonza Bogus, DO   Encounter Date: 12/12/2017  PT End of Session - 12/12/17 1300    Visit Number  10    Number of Visits  13    Date for PT Re-Evaluation  01/09/2018    Authorization Type  Medicare (secondary: generic commercial)    Authorization Time Period  09/21/17 to 10/19/17; NEW: 11/01/17 to 11/22/17; NEW: 11/22/17 to 12/13/17    Authorization - Visit Number  10    Authorization - Number of Visits  13    PT Start Time  1302    PT Stop Time  1344    PT Time Calculation (min)  42 min    Equipment Utilized During Treatment  Gait belt    Activity Tolerance  Patient tolerated treatment well    Behavior During Therapy  [facility] for tasks assessed/performed       Past Medical History:  Diagnosis Date  . Angina at rest, with the tachycardia 07/05/2011  . Aortic insufficiency 07/05/2011  . Atrial fibrillation ([place])   . Brady-tachy syndrome ([place])    Dual chamber Medtronic pacemaker Adapta  . Chronic back pain   . Constipation 12/27/2011  . Gait instability   . Hyperlipidemia   . Hypothyroidism   . Normal cardiac stress test 2013  . OA (osteoarthritis)   . Pacemaker   . Parkinson's disease ([place])   . Tremor     Past Surgical History:  Procedure Laterality Date  . ABDOMINAL HYSTERECTOMY    . CARDIAC CATHETERIZATION  05/26/1999   normal  . CARDIAC CATHETERIZATION  2001   normal coronary arteries  . cataract    . COLONOSCOPY N/A 11/15/2012   Procedure: COLONOSCOPY;  Surgeon: [name], MD;  [facility];  Service: Endoscopy;  Laterality: N/A;  1030  . ESOPHAGOGASTRODUODENOSCOPY (EGD) WITH PROPOFOL N/A 11/06/2017   Procedure: ESOPHAGOGASTRODUODENOSCOPY (EGD) WITH PROPOFOL;  Surgeon:  [name], MD;  [facility];  Service: Endoscopy;  Laterality: N/A;  . HIP ARTHROPLASTY Left 06/24/2016   Procedure: ARTHROPLASTY BIPOLAR HIP (HEMIARTHROPLASTY);  Surgeon: Altamese Alba, MD;  Location: [facility];  Service: Orthopedics;  Laterality: Left;  . INSERT / REPLACE / REMOVE PACEMAKER    . NM MYOVIEW LTD  07/19/2011   normal  . PERMANENT PACEMAKER INSERTION  07/05/2011   Medtronic Adapta dual chamber  . PERMANENT PACEMAKER INSERTION N/A 07/05/2011   Procedure: PERMANENT PACEMAKER INSERTION;  Surgeon: [name], MD;  Location: [facility] CATH LAB;  Service: Cardiovascular;  Laterality: N/A;    There were no vitals filed for this visit.  Subjective Assessment - 12/12/17 1259    Subjective  Feeling much better since fell around 11/30/2017; thinks she bruised some ribs but able to breathe deeply without pain.    Patient Stated Goals  be able to walk better         [facility] PT Assessment - 12/12/17 0001      Assessment   Medical Diagnosis  Parkinson's Disease    Referring Provider (PT)  Alonza Bogus, DO    Next MD Visit  6 months    Prior Therapy  yes for L THA (d/c'd 08/2016), and Parkinson's earlier this year      Balance Screen   Has the patient  fallen in the past 6 months  Yes    How many times?  1   apprxomately 11/30/2017 in bathroom     Functional Tests   Functional tests  Sit to Stand      Sit to Stand   Comments  30 sec chair to rise   12x, was 7x and 11x     Strength   Right Hip Extension  4-/5   was 4-/5   Left Hip Extension  3+/5   was 3+/5     Balance   Balance Assessed  Yes      Static Standing Balance   Static Standing - Balance Support  No upper extremity supported    Static Standing Balance -  Activities   Single Leg Stance - Right Leg;Single Leg Stance - Left Leg    Static Standing - Comment/# of Minutes  R: 18 sec; Lt 18 sec   was Rt 14 sec; Lt 9 sec     Functional Gait  Assessment   Gait assessed   Yes    Gait Level Surface  Walks 20  ft in less than 5.5 sec, no assistive devices, good speed, no evidence for imbalance, normal gait pattern, deviates no more than 6 in outside of the 12 in walkway width.    Change in Gait Speed  Able to smoothly change walking speed without loss of balance or gait deviation. Deviate no more than 6 in outside of the 12 in walkway width.    Gait with Horizontal Head Turns  Performs head turns with moderate changes in gait velocity, slows down, deviates 10-15 in outside 12 in walkway width but recovers, can continue to walk.    Gait with Vertical Head Turns  Performs task with slight change in gait velocity (eg, minor disruption to smooth gait path), deviates 6 - 10 in outside 12 in walkway width or uses assistive device    Gait and Pivot Turn  Pivot turns safely in greater than 3 sec and stops with no loss of balance, or pivot turns safely within 3 sec and stops with mild imbalance, requires small steps to catch balance.    Step Over Obstacle  Is able to step over one shoe box (4.5 in total height) without changing gait speed. No evidence of imbalance.    Gait with Narrow Base of Support  Ambulates less than 4 steps heel to toe or cannot perform without assistance.    Gait with Eyes Closed  Walks 20 ft, uses assistive device, slower speed, mild gait deviations, deviates 6-10 in outside 12 in walkway width. Ambulates 20 ft in less than 9 sec but greater than 7 sec.    Ambulating Backwards  Walks 20 ft, slow speed, abnormal gait pattern, evidence for imbalance, deviates 10-15 in outside 12 in walkway width.    Steps  Alternating feet, no rail.    Total Score  19    FGA comment:  was 21/30                   [facility] Adult PT Treatment/Exercise - 12/12/17 0001      Knee/Hip Exercises: Supine   Bridges  Strengthening;Both;1 set;10 reps   5sec hold            PT Education - 12/12/17 1427    Education Details  Examination findings and plan of care. Review of bridges for HEP.     Person(s) Educated  Patient    Methods  Explanation;Demonstration  Comprehension  Verbalized understanding       PT Short Term Goals - 12/12/17 1300      PT SHORT TERM GOAL #1   Title  Pt will score 25/30 or > on the FGA to demo improved dynamic balance and decrease her risk for falls.    Baseline  10/30: 21/30, was 19; 12/12/17 - 19/30    Time  2    Period  Weeks    Status  On-going      PT SHORT TERM GOAL #2   Title  Pt will be able to perform 14 STS during 30sec chair rise test in order to demo improved hip extension strength and overall functional strength.    Baseline  10/30: 11x, was 7x; 12/12/17 - 12x    Time  2    Period  Weeks    Status  On-going        PT Long Term Goals - 12/12/17 1301      PT LONG TERM GOAL #1   Title  Pt's bil hip ext MMT will improve to 4/5 or better in order to maximize balance.    Baseline  10/30: see MMT; 12/12/2017 - see MMT    Time  4    Period  Weeks    Status  On-going      PT LONG TERM GOAL #2   Title  Pt will be able to perform bil SLS for 20 sec or > in order to demo improved static balance and to maximize her gait on uneven ground and stair ambulation.    Baseline  10/9: R: 14sec, L: 9.2sec; 12/12/17 - Rt 18 sec; Lt 18 sec    Time  4    Period  Weeks    Status  On-going            Plan - 12/12/17 1300    Clinical Impression Statement  Re-assessed patient's status today. Patient did have a fall 11/30/2017 without significant injury although she was quite sore in the ribs for several days after. Patient continues to exhibit deficits in strength, balance, gait and functional ability. Patient has 3 additional visits scheduled which will be utilized to ensure patient is independent in performance of HEP to continue to work on her dynamic and static balance after discharge from [facility]. Patient would benefit from OPPT to further advance static and dynamic exercises to address the aforementioned deficits.     Rehab Potential  Good     PT Frequency  1x / week    PT Duration  4 weeks    PT Treatment/Interventions  ADLs/Self Care Home Management;Aquatic Therapy;Cryotherapy;Moist Heat;Ultrasound;Gait training;Stair training;Functional mobility training;Therapeutic activities;Therapeutic exercise;Balance training;Neuromuscular re-education;Patient/family education;Manual techniques;Passive range of motion;Dry needling;Taping    PT Next Visit Plan  Continue with PWR attempt PWR step again as this was to difficult for pt. cotninue to challenge balance; advance HEP in preparation for DC from PT.    PT Home Exercise Plan  01/26/17: bridging, STS; 1/16: HS stretch in supine, PWR! Sitting; 1/28: seated adductor stretching; 2/13: standing and seated PWR! moves;   8/29 eval: continue previous HEP, SLS    Consulted and Agree with Plan of Care  Patient       Patient will benefit from skilled therapeutic intervention in order to improve the following deficits and impairments:  Decreased balance, Decreased activity tolerance, Decreased strength, Hypomobility, Postural dysfunction, Improper body mechanics, Pain  Visit Diagnosis: Unsteadiness on feet  Other abnormalities of gait and mobility  Muscle  weakness (generalized)  Abnormal posture     Problem List Patient Active Problem List   Diagnosis Date Noted  . Anticoagulated   . Epigastric pain   . Diverticulosis   . Melena 11/04/2017  . Atrial fibrillation with RVR ([place]) 09/29/2017  . RBD (REM behavioral disorder) 01/10/2017  . Neurogenic orthostatic hypotension ([place]) 01/10/2017  . Tachy-brady syndrome ([place]) 06/23/2016  . Closed subcapital fracture of left femur ([place]) 06/23/2016  . Orthostatic hypotension due to Parkinson's disease ([place]) 06/23/2016  . PD (Parkinson's disease) ([place]) 04/21/2016  . Hypokalemia 04/21/2016  . Normal coronary arteries 2001 05/08/2014  . Pacemaker 08/10/2012  . Aortic insufficiency 08/10/2012  . Constipation 12/27/2011  . Family hx of colon cancer  12/27/2011  . HTN (hypertension) 07/06/2011  . Hypothyroidism 07/05/2011  . Paroxysmal atrial fibrillation ([place]) 07/05/2011  . Sinus pause, 7 seconds post conversion, S/P MDT pacemaker 07/05/11 07/05/2011  . Dyslipidemia 07/05/2011  . Angina at rest, with rapid AF 07/05/2011    Floria Raveling. Hartnett-Rands, MS, PT [facility] #[id number] 12/12/2017, 2:40 PM  [facility] [address] [place], [place], [address] Phone: [phone]   Fax:  [phone]  Name: Abigail Taylor MRN: [id number] Date of Birth: February 08, 1931

## 2017-12-14 ENCOUNTER — Ambulatory Visit (HOSPITAL_COMMUNITY): Payer: Medicare Other

## 2017-12-14 ENCOUNTER — Encounter (HOSPITAL_COMMUNITY): Payer: Self-pay

## 2017-12-14 DIAGNOSIS — R2689 Other abnormalities of gait and mobility: Secondary | ICD-10-CM

## 2017-12-14 DIAGNOSIS — R2681 Unsteadiness on feet: Secondary | ICD-10-CM

## 2017-12-14 DIAGNOSIS — R293 Abnormal posture: Secondary | ICD-10-CM | POA: Diagnosis not present

## 2017-12-14 DIAGNOSIS — M6281 Muscle weakness (generalized): Secondary | ICD-10-CM | POA: Diagnosis not present

## 2017-12-14 NOTE — Therapy (Addendum)
[place], [place], [address] Phone: [phone]   Fax:  [phone]  Physical Therapy Treatment  Patient Details  Name: Abigail Taylor MRN: [id number] Date of Birth: 1931-02-03 Referring Provider (PT): Alonza Bogus, DO   Encounter Date: 12/14/2017  PT End of Session - 12/14/17 1518    Visit Number  11    Number of Visits  13    Date for PT Re-Evaluation  01/09/18   re-assessment 12/12/2017   Authorization Type  Medicare (secondary: generic commercial)    Authorization Time Period  09/21/17 to 10/19/17; NEW: 11/01/17 to 11/22/17; NEW: 11/22/17 to 12/13/17    Authorization - Visit Number  11    Authorization - Number of Visits  13    PT Start Time  1660    PT Stop Time  1558    PT Time Calculation (min)  41 min    Equipment Utilized During Treatment  Gait belt    Activity Tolerance  Patient tolerated treatment well    Behavior During Therapy  [facility] for tasks assessed/performed       Past Medical History:  Diagnosis Date  . Angina at rest, with the tachycardia 07/05/2011  . Aortic insufficiency 07/05/2011  . Atrial fibrillation ([place])   . Brady-tachy syndrome ([place])    Dual chamber Medtronic pacemaker Adapta  . Chronic back pain   . Constipation 12/27/2011  . Gait instability   . Hyperlipidemia   . Hypothyroidism   . Normal cardiac stress test 2013  . OA (osteoarthritis)   . Pacemaker   . Parkinson's disease ([place])   . Tremor     Past Surgical History:  Procedure Laterality Date  . ABDOMINAL HYSTERECTOMY    . CARDIAC CATHETERIZATION  05/26/1999   normal  . CARDIAC CATHETERIZATION  2001   normal coronary arteries  . cataract    . COLONOSCOPY N/A 11/15/2012   Procedure: COLONOSCOPY;  Surgeon: [name], MD;  [facility];  Service: Endoscopy;  Laterality: N/A;  1030  . ESOPHAGOGASTRODUODENOSCOPY (EGD) WITH PROPOFOL N/A 11/06/2017   Procedure: ESOPHAGOGASTRODUODENOSCOPY (EGD) WITH PROPOFOL;   Surgeon: [name], MD;  [facility];  Service: Endoscopy;  Laterality: N/A;  . HIP ARTHROPLASTY Left 06/24/2016   Procedure: ARTHROPLASTY BIPOLAR HIP (HEMIARTHROPLASTY);  Surgeon: Altamese [place], MD;  Location: [facility];  Service: Orthopedics;  Laterality: Left;  . INSERT / REPLACE / REMOVE PACEMAKER    . NM MYOVIEW LTD  07/19/2011   normal  . PERMANENT PACEMAKER INSERTION  07/05/2011   Medtronic Adapta dual chamber  . PERMANENT PACEMAKER INSERTION N/A 07/05/2011   Procedure: PERMANENT PACEMAKER INSERTION;  Surgeon: [name], MD;  Location: [facility] CATH LAB;  Service: Cardiovascular;  Laterality: N/A;    There were no vitals filed for this visit.  Subjective Assessment - 12/14/17 1519    Subjective  Pt states "I haven't fallen today." No close calls recently either.     Patient Stated Goals  be able to walk better    Currently in Pain?  No/denies           [facility]) - 12/14/17 1521    PWR! Rock  x10 reps each (reaching for post-its)    PWR! Twist  x10 bil    PWR Step  x10reps bil      Balance Exercises - 12/14/17 1543      Balance Exercises: Standing   Tandem Gait  Forward   x2+dual task,  x2+EC, x2 6" hurdles, firm throughout   Lift / Chop Limitations  bil tandem stance foam +velcro ball toss x 15-20 tosses each way in staggered and tandem stance            PT Short Term Goals - 12/12/17 1300      PT SHORT TERM GOAL #1   Title  Pt will score 25/30 or > on the FGA to demo improved dynamic balance and decrease her risk for falls.    Baseline  10/30: 21/30, was 19; 12/12/17 - 19/30    Time  2    Period  Weeks    Status  On-going    Target Date  12/26/17      PT SHORT TERM GOAL #2   Title  Pt will be able to perform 14 STS during 30sec chair rise test in order to demo improved hip extension strength and overall functional strength.    Baseline  10/30: 11x, was 7x; 12/12/17 - 12x    Time  2    Period  Weeks    Status  On-going        PT Long  Term Goals - 12/12/17 1301      PT LONG TERM GOAL #1   Title  Pt's bil hip ext MMT will improve to 4/5 or better in order to maximize balance.    Baseline  10/30: see MMT; 12/12/2017 - see MMT    Time  4    Period  Weeks    Status  On-going    Target Date  01/09/18      PT LONG TERM GOAL #2   Title  Pt will be able to perform bil SLS for 20 sec or > in order to demo improved static balance and to maximize her gait on uneven ground and stair ambulation.    Baseline  10/9: R: 14sec, L: 9.2sec; 12/12/17 - Rt 18 sec; Lt 18 sec    Time  4    Period  Weeks    Status  On-going            Plan - 12/14/17 1601    Clinical Impression Statement  Continued with established POC focusing on dynamic balance. Began with PWR! Moves and pt only required min guard - supervision for PWR! Step and twist. Pt challenged with Velcro ball toss especially in tandem stance but she demo'd good reaction strategies. Progressed her to tandem gait with EC. Added tandem stance in corner to HEP. Continue as planned, progressing as able.     Rehab Potential  Good    PT Frequency  1x / week    PT Duration  4 weeks    PT Treatment/Interventions  ADLs/Self Care Home Management;Aquatic Therapy;Cryotherapy;Moist Heat;Ultrasound;Gait training;Stair training;Functional mobility training;Therapeutic activities;Therapeutic exercise;Balance training;Neuromuscular re-education;Patient/family education;Manual techniques;Passive range of motion;Dry needling;Taping    PT Next Visit Plan  Continue with PWR attempt PWR step again as this was to difficult for pt. cotninue to challenge balance; advance HEP in preparation for DC from PT.    PT Home Exercise Plan  01/26/17: bridging, STS; 1/16: HS stretch in supine, PWR! Sitting; 1/28: seated adductor stretching; 2/13: standing and seated PWR! moves;   8/29 eval: continue previous HEP, SLS; 11/21: tandem stance in corner    Consulted and Agree with Plan of Care  Patient       Patient  will benefit from skilled therapeutic intervention in order to improve the following deficits and impairments:  Decreased balance, Decreased activity  tolerance, Decreased strength, Hypomobility, Postural dysfunction, Improper body mechanics, Pain  Visit Diagnosis: Unsteadiness on feet  Other abnormalities of gait and mobility     Problem List Patient Active Problem List   Diagnosis Date Noted  . Anticoagulated   . Epigastric pain   . Diverticulosis   . Melena 11/04/2017  . Atrial fibrillation with RVR ([place]) 09/29/2017  . RBD (REM behavioral disorder) 01/10/2017  . Neurogenic orthostatic hypotension ([place]) 01/10/2017  . Tachy-brady syndrome ([place]) 06/23/2016  . Closed subcapital fracture of left femur ([place]) 06/23/2016  . Orthostatic hypotension due to Parkinson's disease ([place]) 06/23/2016  . PD (Parkinson's disease) ([place]) 04/21/2016  . Hypokalemia 04/21/2016  . Normal coronary arteries 2001 05/08/2014  . Pacemaker 08/10/2012  . Aortic insufficiency 08/10/2012  . Constipation 12/27/2011  . Family hx of colon cancer 12/27/2011  . HTN (hypertension) 07/06/2011  . Hypothyroidism 07/05/2011  . Paroxysmal atrial fibrillation ([place]) 07/05/2011  . Sinus pause, 7 seconds post conversion, S/P MDT pacemaker 07/05/11 07/05/2011  . Dyslipidemia 07/05/2011  . Angina at rest, with rapid AF 07/05/2011       Geraldine Solar PT, DPT  [facility] [address] [place], [place], [address] Phone: [phone]   Fax:  [phone]  Name: Abigail Taylor MRN: [id number] Date of Birth: 1931-05-19

## 2017-12-19 ENCOUNTER — Encounter (HOSPITAL_COMMUNITY): Payer: Self-pay

## 2017-12-19 ENCOUNTER — Ambulatory Visit (HOSPITAL_COMMUNITY): Payer: Medicare Other

## 2017-12-19 DIAGNOSIS — M6281 Muscle weakness (generalized): Secondary | ICD-10-CM | POA: Diagnosis not present

## 2017-12-19 DIAGNOSIS — R2681 Unsteadiness on feet: Secondary | ICD-10-CM | POA: Diagnosis not present

## 2017-12-19 DIAGNOSIS — R2689 Other abnormalities of gait and mobility: Secondary | ICD-10-CM | POA: Diagnosis not present

## 2017-12-19 DIAGNOSIS — R293 Abnormal posture: Secondary | ICD-10-CM | POA: Diagnosis not present

## 2017-12-19 NOTE — Therapy (Signed)
[facility] [place], [place], [address] Phone: [phone]   Fax:  [phone]  Physical Therapy Treatment  Patient Details  Name: Abigail Taylor MRN: [id number] Date of Birth: 01-Apr-1931 Referring Provider (PT): Alonza Bogus, DO   Encounter Date: 12/19/2017  PT End of Session - 12/19/17 1347    Visit Number  12    Number of Visits  13    Date for PT Re-Evaluation  01/09/18   re-assessment 12/12/2017   Authorization Type  Medicare (secondary: generic commercial)    Authorization Time Period  09/21/17 to 10/19/17; NEW: 11/01/17 to 11/22/17; NEW: 11/22/17 to 12/13/17    Authorization - Visit Number  12    Authorization - Number of Visits  13    PT Start Time  5852    PT Stop Time  1427    PT Time Calculation (min)  40 min    Equipment Utilized During Treatment  Gait belt    Activity Tolerance  Patient tolerated treatment well    Behavior During Therapy  [facility] for tasks assessed/performed       Past Medical History:  Diagnosis Date  . Angina at rest, with the tachycardia 07/05/2011  . Aortic insufficiency 07/05/2011  . Atrial fibrillation ([place])   . Brady-tachy syndrome ([place])    Dual chamber Medtronic pacemaker Adapta  . Chronic back pain   . Constipation 12/27/2011  . Gait instability   . Hyperlipidemia   . Hypothyroidism   . Normal cardiac stress test 2013  . OA (osteoarthritis)   . Pacemaker   . Parkinson's disease ([place])   . Tremor     Past Surgical History:  Procedure Laterality Date  . ABDOMINAL HYSTERECTOMY    . CARDIAC CATHETERIZATION  05/26/1999   normal  . CARDIAC CATHETERIZATION  2001   normal coronary arteries  . cataract    . COLONOSCOPY N/A 11/15/2012   Procedure: COLONOSCOPY;  Surgeon: [name], MD;  [facility];  Service: Endoscopy;  Laterality: N/A;  1030  . ESOPHAGOGASTRODUODENOSCOPY (EGD) WITH PROPOFOL N/A 11/06/2017   Procedure: ESOPHAGOGASTRODUODENOSCOPY (EGD) WITH PROPOFOL;   Surgeon: [name], MD;  [facility];  Service: Endoscopy;  Laterality: N/A;  . HIP ARTHROPLASTY Left 06/24/2016   Procedure: ARTHROPLASTY BIPOLAR HIP (HEMIARTHROPLASTY);  Surgeon: Altamese Chenoa, MD;  Location: [facility];  Service: Orthopedics;  Laterality: Left;  . INSERT / REPLACE / REMOVE PACEMAKER    . NM MYOVIEW LTD  07/19/2011   normal  . PERMANENT PACEMAKER INSERTION  07/05/2011   Medtronic Adapta dual chamber  . PERMANENT PACEMAKER INSERTION N/A 07/05/2011   Procedure: PERMANENT PACEMAKER INSERTION;  Surgeon: [name], MD;  Location: [facility];  Service: Cardiovascular;  Laterality: N/A;    There were no vitals filed for this visit.  Subjective Assessment - 12/19/17 1347    Subjective  Pt states that she has a lot going today. She denies any pain today.     Patient Stated Goals  be able to walk better    Currently in Pain?  No/denies            [facility]) - 12/19/17 1350    PWR! Up  x10 reps +OH lift with volleyball and RTB around knees    PWR! Rock  x10 reps each, reaching for post-it notes    PWR! Twist  x10 bil    PWR Step  x10 reps bil  Balance Exercises - 12/19/17 1407      Balance Exercises: Standing   Tandem Stance  Eyes closed;Foam/compliant surface;Intermittent upper extremity support   +UE flex x10 reps each   Lift / Chop Limitations  bil tandem stance foam +velcro ball toss x 15-20 tosses each foot fwd    Other Standing Exercises  bil tandem stance firm +basketball shooting x15-20 tosses each way; NBOS foam +basketball shooting x15-20 tosses             PT Education - 12/19/17 1347    Education provided  Yes    Education Details  exercise technique, continue HEP, will reassess next visit    Person(s) Educated  Patient    Methods  Explanation;Demonstration    Comprehension  Verbalized understanding;Returned demonstration       PT Short Term Goals - 12/12/17 1300      PT SHORT TERM GOAL #1   Title  Pt will score  25/30 or > on the FGA to demo improved dynamic balance and decrease her risk for falls.    Baseline  10/30: 21/30, was 19; 12/12/17 - 19/30    Time  2    Period  Weeks    Status  On-going    Target Date  12/26/17      PT SHORT TERM GOAL #2   Title  Pt will be able to perform 14 STS during 30sec chair rise test in order to demo improved hip extension strength and overall functional strength.    Baseline  10/30: 11x, was 7x; 12/12/17 - 12x    Time  2    Period  Weeks    Status  On-going        PT Long Term Goals - 12/12/17 1301      PT LONG TERM GOAL #1   Title  Pt's bil hip ext MMT will improve to 4/5 or better in order to maximize balance.    Baseline  10/30: see MMT; 12/12/2017 - see MMT    Time  4    Period  Weeks    Status  On-going    Target Date  01/09/18      PT LONG TERM GOAL #2   Title  Pt will be able to perform bil SLS for 20 sec or > in order to demo improved static balance and to maximize her gait on uneven ground and stair ambulation.    Baseline  10/9: R: 14sec, L: 9.2sec; 12/12/17 - Rt 18 sec; Lt 18 sec    Time  4    Period  Weeks    Status  On-going            Plan - 12/19/17 1528    Clinical Impression Statement  Continued with established POC focusing on improving mm power and dynamic balance. Resumed PWR up and continued other PWR activities to improve pt's initiation of movement and overall strength and balance. Min cues for form and had pt describing Thanksgiving plans/meals/daily plans, etc. during for dual task which did cause pt to be slightly more unsteady but not enough for her to require assistance. Continued with ball toss on firm and foam this date with tandem and NBOS stance and she was generally unsteady with her arms out in front but able to maintain balance by herself. Overall, pt progressing well. She is due for reassessment next visit.     Rehab Potential  Good    PT Frequency  1x / week    PT  Duration  4 weeks    PT  Treatment/Interventions  ADLs/Self Care Home Management;Aquatic Therapy;Cryotherapy;Moist Heat;Ultrasound;Gait training;Stair training;Functional mobility training;Therapeutic activities;Therapeutic exercise;Balance training;Neuromuscular re-education;Patient/family education;Manual techniques;Passive range of motion;Dry needling;Taping    PT Next Visit Plan  reasesssment; Continue with PWR attempt PWR step again as this was to difficult for pt. cotninue to challenge balance; advance HEP in preparation for DC from PT.    PT Home Exercise Plan  01/26/17: bridging, STS; 1/16: HS stretch in supine, PWR! Sitting; 1/28: seated adductor stretching; 2/13: standing and seated PWR! moves;   8/29 eval: continue previous HEP, SLS; 11/21: tandem stance in corner    Consulted and Agree with Plan of Care  Patient       Patient will benefit from skilled therapeutic intervention in order to improve the following deficits and impairments:  Decreased balance, Decreased activity tolerance, Decreased strength, Hypomobility, Postural dysfunction, Improper body mechanics, Pain  Visit Diagnosis: Unsteadiness on feet  Other abnormalities of gait and mobility     Problem List Patient Active Problem List   Diagnosis Date Noted  . Anticoagulated   . Epigastric pain   . Diverticulosis   . Melena 11/04/2017  . Atrial fibrillation with RVR ([place]) 09/29/2017  . RBD (REM behavioral disorder) 01/10/2017  . Neurogenic orthostatic hypotension ([place]) 01/10/2017  . Tachy-brady syndrome ([place]) 06/23/2016  . Closed subcapital fracture of left femur ([place]) 06/23/2016  . Orthostatic hypotension due to Parkinson's disease ([place]) 06/23/2016  . PD (Parkinson's disease) ([place]) 04/21/2016  . Hypokalemia 04/21/2016  . Normal coronary arteries 2001 05/08/2014  . Pacemaker 08/10/2012  . Aortic insufficiency 08/10/2012  . Constipation 12/27/2011  . Family hx of colon cancer 12/27/2011  . HTN (hypertension) 07/06/2011  . Hypothyroidism  07/05/2011  . Paroxysmal atrial fibrillation ([place]) 07/05/2011  . Sinus pause, 7 seconds post conversion, S/P MDT pacemaker 07/05/11 07/05/2011  . Dyslipidemia 07/05/2011  . Angina at rest, with rapid AF 07/05/2011         Geraldine Solar PT, DPT   [facility] [address] [place], [place], [address] Phone: [phone]   Fax:  [phone]  Name: Abigail Taylor MRN: [id number] Date of Birth: May 23, 1931

## 2017-12-27 ENCOUNTER — Encounter (HOSPITAL_COMMUNITY): Payer: Self-pay

## 2017-12-27 ENCOUNTER — Ambulatory Visit (HOSPITAL_COMMUNITY): Payer: Medicare Other | Attending: Neurology

## 2017-12-27 DIAGNOSIS — R2689 Other abnormalities of gait and mobility: Secondary | ICD-10-CM | POA: Diagnosis not present

## 2017-12-27 DIAGNOSIS — R2681 Unsteadiness on feet: Secondary | ICD-10-CM | POA: Insufficient documentation

## 2017-12-27 NOTE — Therapy (Signed)
[facility] [address] [place], [place], [address] Phone: [phone]   Fax:  [phone]   PHYSICAL THERAPY DISCHARGE SUMMARY  Visits from Start of Care: 13  Current functional level related to goals / functional outcomes: See below   Remaining deficits: See below   Education / Equipment: conitnue HEP  Plan: Patient agrees to discharge.  Patient goals were partially met. Patient is being discharged due to being pleased with the current functional level.  ?????      Physical Therapy Treatment  Patient Details  Name: Abigail Taylor MRN: [id number] Date of Birth: 02-Jul-1931 Referring Provider (PT): Alonza Bogus, DO   Encounter Date: 12/27/2017  PT End of Session - 12/27/17 1517    Visit Number  13    Number of Visits  13    Date for PT Re-Evaluation  01/09/18   re-assessment 12/12/2017   Authorization Type  Medicare (secondary: generic commercial)    Authorization Time Period  09/21/17 to 10/19/17; NEW: 11/01/17 to 11/22/17; NEW: 11/22/17 to 12/13/17    Authorization - Visit Number  13    Authorization - Number of Visits  13    PT Start Time  0938    PT Stop Time  1541    PT Time Calculation (min)  25 min    Equipment Utilized During Treatment  Gait belt    Activity Tolerance  Patient tolerated treatment well    Behavior During Therapy  WFL for tasks assessed/performed       Past Medical History:  Diagnosis Date  . Angina at rest, with the tachycardia 07/05/2011  . Aortic insufficiency 07/05/2011  . Atrial fibrillation ([place])   . Brady-tachy syndrome ([place])    Dual chamber Medtronic pacemaker Adapta  . Chronic back pain   . Constipation 12/27/2011  . Gait instability   . Hyperlipidemia   . Hypothyroidism   . Normal cardiac stress test 2013  . OA (osteoarthritis)   . Pacemaker   . Parkinson's disease ([place])   . Tremor     Past Surgical History:  Procedure Laterality Date  . ABDOMINAL HYSTERECTOMY    . CARDIAC  CATHETERIZATION  05/26/1999   normal  . CARDIAC CATHETERIZATION  2001   normal coronary arteries  . cataract    . COLONOSCOPY N/A 11/15/2012   Procedure: COLONOSCOPY;  Surgeon: [name], MD;  [facility];  Service: Endoscopy;  Laterality: N/A;  1030  . ESOPHAGOGASTRODUODENOSCOPY (EGD) WITH PROPOFOL N/A 11/06/2017   Procedure: ESOPHAGOGASTRODUODENOSCOPY (EGD) WITH PROPOFOL;  Surgeon: [name], MD;  [facility];  Service: Endoscopy;  Laterality: N/A;  . HIP ARTHROPLASTY Left 06/24/2016   Procedure: ARTHROPLASTY BIPOLAR HIP (HEMIARTHROPLASTY);  Surgeon: Altamese Gaines, MD;  Location: [facility];  Service: Orthopedics;  Laterality: Left;  . INSERT / REPLACE / REMOVE PACEMAKER    . NM MYOVIEW LTD  07/19/2011   normal  . PERMANENT PACEMAKER INSERTION  07/05/2011   Medtronic Adapta dual chamber  . PERMANENT PACEMAKER INSERTION N/A 07/05/2011   Procedure: PERMANENT PACEMAKER INSERTION;  Surgeon: [name], MD;  Location: [facility]ATH [facility];  Service: Cardiovascular;  Laterality: N/A;    There were no vitals filed for this visit.  Subjective Assessment - 12/27/17 1518    Subjective  Pt states that she had a good Thanksgiving. No recent falls or close calls.     Limitations  Walking;Standing;House hold activities    How long can you sit comfortably?  no  issues    How long can you stand comfortably?  no real issues, just the standing up from sitting from a couch or soft chair is difficult due to her back pain    How long can you walk comfortably?  10 mins    Patient Stated Goals  be able to walk better    Currently in Pain?  No/denies         [facility] PT Assessment - 12/27/17 0001      Functional Tests   Functional tests  Sit to Stand      Sit to Stand   Comments  30 sec chair to rise: 11x   was 12x     Strength   Right Hip Extension  4/5   was 4-   Left Hip Extension  4/5   was 3+     Balance   Balance Assessed  Yes      Static Standing Balance   Static  Standing - Balance Support  No upper extremity supported    Static Standing Balance -  Activities   Single Leg Stance - Right Leg;Single Leg Stance - Left Leg    Static Standing - Comment/# of Minutes  R: 23sec L: 11   was 18sec BLE     Functional Gait  Assessment   Gait assessed   Yes    Gait Level Surface  Walks 20 ft in less than 5.5 sec, no assistive devices, good speed, no evidence for imbalance, normal gait pattern, deviates no more than 6 in outside of the 12 in walkway width.    Change in Gait Speed  Able to smoothly change walking speed without loss of balance or gait deviation. Deviate no more than 6 in outside of the 12 in walkway width.    Gait with Horizontal Head Turns  Performs head turns smoothly with slight change in gait velocity (eg, minor disruption to smooth gait path), deviates 6-10 in outside 12 in walkway width, or uses an assistive device.    Gait with Vertical Head Turns  Performs head turns with no change in gait. Deviates no more than 6 in outside 12 in walkway width.    Gait and Pivot Turn  Pivot turns safely within 3 sec and stops quickly with no loss of balance.    Step Over Obstacle  Is able to step over 2 stacked shoe boxes taped together (9 in total height) without changing gait speed. No evidence of imbalance.    Gait with Narrow Base of Support  Is able to ambulate for 10 steps heel to toe with no staggering.    Gait with Eyes Closed  Walks 20 ft, uses assistive device, slower speed, mild gait deviations, deviates 6-10 in outside 12 in walkway width. Ambulates 20 ft in less than 9 sec but greater than 7 sec.    Ambulating Backwards  Walks 20 ft, uses assistive device, slower speed, mild gait deviations, deviates 6-10 in outside 12 in walkway width.    Steps  Alternating feet, no rail.    Total Score  27            PT Short Term Goals - 12/27/17 1518      PT SHORT TERM GOAL #1   Title  Pt will score 25/30 or > on the FGA to demo improved dynamic balance  and decrease her risk for falls.    Baseline  12/4: 27/30 (was 19-21/30)    Time  2    Period  Weeks    Status  Achieved      PT SHORT TERM GOAL #2   Title  Pt will be able to perform 14 STS during 30sec chair rise test in order to demo improved hip extension strength and overall functional strength.    Baseline  12/4: 11x, was 12x    Time  2    Period  Weeks    Status  On-going         PT Long Term Goals - 12/27/17 1519      PT LONG TERM GOAL #1   Title  Pt's bil hip ext MMT will improve to 4/5 or better in order to maximize balance.    Baseline  12/4: see MMT    Time  4    Period  Weeks    Status  Achieved      PT LONG TERM GOAL #2   Title  Pt will be able to perform bil SLS for 20 sec or > in order to demo improved static balance and to maximize her gait on uneven ground and stair ambulation.    Baseline  12/4: 23sec R, 11sec L    Time  4    Period  Weeks    Status  Partially Met            Plan - 12/27/17 1544    Clinical Impression Statement  PT reassessed pt's goals and outcome measures this date. Pt has made great progress towards goals as illustrated above. Her FGA improved to 27/30 (has been 19-21 in previous assessments) and she was able to stand on her RLE for > 20sec this date. Her MMT improved to 4/5 and her overall functional strength has slightly improved but remained relatively unchanged since her last discharge from therapy and her recent evaluation. At this time, pt is ready for d/c due to progress made. She was encouraged to continue her HEP consistently in order to reduce her risk for falls and improve overall function and she verbalized understanding.     Rehab Potential  Good    PT Frequency  1x / week    PT Duration  4 weeks    PT Treatment/Interventions  ADLs/Self Care Home Management;Aquatic Therapy;Cryotherapy;Moist Heat;Ultrasound;Gait training;Stair training;Functional mobility training;Therapeutic activities;Therapeutic exercise;Balance  training;Neuromuscular re-education;Patient/family education;Manual techniques;Passive range of motion;Dry needling;Taping    PT Next Visit Plan  d/c to HEP    PT Home Exercise Plan  01/26/17: bridging, STS; 1/16: HS stretch in supine, PWR! Sitting; 1/28: seated adductor stretching; 2/13: standing and seated PWR! moves;   8/29 eval: continue previous HEP, SLS; 11/21: tandem stance in corner    Consulted and Agree with Plan of Care  Patient       Patient will benefit from skilled therapeutic intervention in order to improve the following deficits and impairments:  Decreased balance, Decreased activity tolerance, Decreased strength, Hypomobility, Postural dysfunction, Improper body mechanics, Pain  Visit Diagnosis: Unsteadiness on feet  Other abnormalities of gait and mobility     Problem List Patient Active Problem List   Diagnosis Date Noted  . Anticoagulated   . Epigastric pain   . Diverticulosis   . Melena 11/04/2017  . Atrial fibrillation with RVR ([place]) 09/29/2017  . RBD (REM behavioral disorder) 01/10/2017  . Neurogenic orthostatic hypotension ([place]) 01/10/2017  . Tachy-brady syndrome ([place]) 06/23/2016  . Closed subcapital fracture of left femur ([place]) 06/23/2016  . Orthostatic hypotension due to Parkinson's disease ([place]) 06/23/2016  . PD (Parkinson's disease) ([place]) 04/21/2016  .  Hypokalemia 04/21/2016  . Normal coronary arteries 2001 05/08/2014  . Pacemaker 08/10/2012  . Aortic insufficiency 08/10/2012  . Constipation 12/27/2011  . Family hx of colon cancer 12/27/2011  . HTN (hypertension) 07/06/2011  . Hypothyroidism 07/05/2011  . Paroxysmal atrial fibrillation ([place]) 07/05/2011  . Sinus pause, 7 seconds post conversion, S/P MDT pacemaker 07/05/11 07/05/2011  . Dyslipidemia 07/05/2011  . Angina at rest, with rapid AF 07/05/2011        Geraldine Solar PT, DPT   [facility] [address] [place], [place], [address] Phone:  [phone]   Fax:  [phone]  Name: IRASEMA CHALK MRN: [id number] Date of Birth: January 14, 1932

## 2018-01-07 ENCOUNTER — Encounter (HOSPITAL_COMMUNITY): Payer: Self-pay

## 2018-01-07 ENCOUNTER — Other Ambulatory Visit: Payer: Self-pay

## 2018-01-07 ENCOUNTER — Emergency Department (HOSPITAL_COMMUNITY)
Admission: EM | Admit: 2018-01-07 | Discharge: 2018-01-07 | Disposition: A | Discharge status: Home / Self Care | Payer: Medicare Other | Attending: Emergency Medicine | Admitting: Emergency Medicine

## 2018-01-07 ENCOUNTER — Emergency Department (HOSPITAL_COMMUNITY): Payer: Medicare Other

## 2018-01-07 DIAGNOSIS — M545 Low back pain: Secondary | ICD-10-CM | POA: Diagnosis not present

## 2018-01-07 DIAGNOSIS — Z96642 Presence of left artificial hip joint: Secondary | ICD-10-CM | POA: Diagnosis not present

## 2018-01-07 DIAGNOSIS — Z79899 Other long term (current) drug therapy: Secondary | ICD-10-CM | POA: Insufficient documentation

## 2018-01-07 DIAGNOSIS — E039 Hypothyroidism, unspecified: Secondary | ICD-10-CM | POA: Insufficient documentation

## 2018-01-07 DIAGNOSIS — M5442 Lumbago with sciatica, left side: Secondary | ICD-10-CM | POA: Insufficient documentation

## 2018-01-07 DIAGNOSIS — G20 Parkinson's disease: Secondary | ICD-10-CM | POA: Insufficient documentation

## 2018-01-07 DIAGNOSIS — Z950 Presence of cardiac pacemaker: Secondary | ICD-10-CM | POA: Insufficient documentation

## 2018-01-07 DIAGNOSIS — I10 Essential (primary) hypertension: Secondary | ICD-10-CM | POA: Diagnosis not present

## 2018-01-07 MED ORDER — HYDROCODONE-ACETAMINOPHEN 5-325 MG PO TABS: 1.0000 | ORAL_TABLET | Freq: Four times a day (QID) | ORAL | 0 refills | Status: DC | PRN

## 2018-01-07 MED ORDER — HYDROCODONE-ACETAMINOPHEN 5-325 MG PO TABS
1.0000 | ORAL_TABLET | Freq: Once | ORAL | Status: AC
  Administered 2018-01-07: 1 via ORAL
  Filled 2018-01-07: qty 1

## 2018-01-07 NOTE — ED Triage Notes (Signed)
Pt reports that lower back and left leg began hurting. Reports feeling of pins sticking in back of leg  States she was sitting yesterday and got up and pain started. States she has a hx of sciatica

## 2018-01-07 NOTE — Discharge Instructions (Addendum)
Schedule appointment follow-up with your doctor.  Take the hydrocodone as directed.  Return for any new or worse symptoms.

## 2018-01-07 NOTE — ED Provider Notes (Addendum)
[facility] EMERGENCY DEPARTMENT Provider Note   CSN: [id number] Arrival date & time: 01/07/18  6301     History   Chief Complaint Chief Complaint  Patient presents with  . Back Pain    HPI Abigail Taylor is a 84 y.o. female.  Patient presenting with a complaint of lower back pain and left leg pain that began hurting yesterday.  Pain radiates down her left leg it feels like pins are sticking her in the back of the leg.  No fall or injury.  It came on when patient was sitting yesterday and when she got up she started having the pain.  Patient's had trouble with the left side of her back in the past but not recently.  Patient does appear to be in some significant pain.  Due to her age some concern may be for vertebral collapse.  We will plan on x-rays of the lumbar spine area.     Past Medical History:  Diagnosis Date  . Angina at rest, with the tachycardia 07/05/2011  . Aortic insufficiency 07/05/2011  . Atrial fibrillation ([place])   . Brady-tachy syndrome ([place])    Dual chamber Medtronic pacemaker Adapta  . Chronic back pain   . Constipation 12/27/2011  . Gait instability   . Hyperlipidemia   . Hypothyroidism   . Normal cardiac stress test 2013  . OA (osteoarthritis)   . Pacemaker   . Parkinson's disease ([place])   . Tremor     Patient Active Problem List   Diagnosis Date Noted  . Anticoagulated   . Epigastric pain   . Diverticulosis   . Melena 11/04/2017  . Atrial fibrillation with RVR ([place]) 09/29/2017  . RBD (REM behavioral disorder) 01/10/2017  . Neurogenic orthostatic hypotension ([place]) 01/10/2017  . Tachy-brady syndrome ([place]) 06/23/2016  . Closed subcapital fracture of left femur ([place]) 06/23/2016  . Orthostatic hypotension due to Parkinson's disease ([place]) 06/23/2016  . PD (Parkinson's disease) ([place]) 04/21/2016  . Hypokalemia 04/21/2016  . Normal coronary arteries 2001 05/08/2014  . Pacemaker 08/10/2012  . Aortic insufficiency 08/10/2012  . Constipation  12/27/2011  . Family hx of colon cancer 12/27/2011  . HTN (hypertension) 07/06/2011  . Hypothyroidism 07/05/2011  . Paroxysmal atrial fibrillation ([place]) 07/05/2011  . Sinus pause, 7 seconds post conversion, S/P MDT pacemaker 07/05/11 07/05/2011  . Dyslipidemia 07/05/2011  . Angina at rest, with rapid AF 07/05/2011    Past Surgical History:  Procedure Laterality Date  . ABDOMINAL HYSTERECTOMY    . CARDIAC CATHETERIZATION  05/26/1999   normal  . CARDIAC CATHETERIZATION  2001   normal coronary arteries  . cataract    . COLONOSCOPY N/A 11/15/2012   Procedure: COLONOSCOPY;  Surgeon: [name], MD;  Location: [facility];  Service: Endoscopy;  Laterality: N/A;  1030  . ESOPHAGOGASTRODUODENOSCOPY (EGD) WITH PROPOFOL N/A 11/06/2017   Procedure: ESOPHAGOGASTRODUODENOSCOPY (EGD) WITH PROPOFOL;  Surgeon: [name], MD;  Location: [facility];  Service: Endoscopy;  Laterality: N/A;  . HIP ARTHROPLASTY Left 06/24/2016   Procedure: ARTHROPLASTY BIPOLAR HIP (HEMIARTHROPLASTY);  Surgeon: Altamese Seminary, MD;  Location: [place];  Service: Orthopedics;  Laterality: Left;  . INSERT / REPLACE / REMOVE PACEMAKER    . NM MYOVIEW LTD  07/19/2011   normal  . PERMANENT PACEMAKER INSERTION  07/05/2011   Medtronic Adapta dual chamber  . PERMANENT PACEMAKER INSERTION N/A 07/05/2011   Procedure: PERMANENT PACEMAKER INSERTION;  Surgeon: [name], MD;  Location: [place] CATH LAB;  Service: Cardiovascular;  Laterality: N/A;     OB History   No obstetric history on file.      Home Medications    Prior to Admission medications   Medication Sig Start Date End Date Taking? Authorizing Provider  amiodarone (PACERONE) 200 MG tablet Take 200 mg daily with largest meal Patient taking differently: Take 200 mg by mouth daily. Take 200 mg daily with largest meal 10/24/17  Yes Barrett, Evelene Croon, PA-C  bisacodyl (BISACODYL) 5 MG EC tablet Take 10 mg by mouth 2 (two) times daily as needed for moderate  constipation.   Yes [provider]  carbidopa-levodopa (SINEMET IR) 25-100 MG tablet Take 1 tablet by mouth 3 (three) times daily with meals. 10/04/17  Yes Emokpae, Courage, MD  fludrocortisone (FLORINEF) 0.1mg /mL SUSP Take 0.1 mg by mouth daily. (pill)   Yes [provider]  levothyroxine (SYNTHROID, LEVOTHROID) 75 MCG tablet Take 75 mcg by mouth daily before breakfast.   Yes [provider]  LINZESS 145 MCG CAPS capsule Take 145 mcg by mouth daily. 12/14/17  Yes [provider]  rosuvastatin (CRESTOR) 10 MG tablet Take 10 mg by mouth at bedtime. Reported on 07/09/2015   Yes [provider]  XIIDRA 5 % SOLN Place 1 drop into the left eye 2 (two) times daily.  10/04/16  Yes [provider]  ELIQUIS 2.5 MG TABS tablet TAKE 1 TABLET BY MOUTH TWICE A DAY Patient taking differently: Take 2.5 mg by mouth 2 (two) times daily.  10/31/17   Croitoru, Mihai, MD  HYDROcodone-acetaminophen (NORCO/VICODIN) 5-325 MG tablet Take 1 tablet by mouth every 6 (six) hours as needed. 01/07/18   Fredia Sorrow, MD    Family History Family History  Problem Relation Age of Onset  . Colon cancer Mother 26  . Heart attack Mother   . Cancer Father   . Suicidality Son     Social History Social History   Tobacco Use  . Smoking status: Never Smoker  . Smokeless tobacco: Never Used  Substance Use Topics  . Alcohol use: No    Alcohol/week: 0.0 standard drinks  . Drug use: No     Allergies   Patient has no known allergies.   Review of Systems Review of Systems  Constitutional: Negative for fever.  HENT: Negative for congestion.   Eyes: Negative for visual disturbance.  Respiratory: Negative for shortness of breath.   Cardiovascular: Negative for chest pain.  Gastrointestinal: Negative for abdominal pain.  Genitourinary: Negative for dysuria.  Musculoskeletal: Positive for back pain. Negative for neck pain.  Skin: Negative for wound.  Neurological:  Negative for weakness, numbness and headaches.  Hematological: Does not bruise/bleed easily.  Psychiatric/Behavioral: Negative for confusion.     Physical Exam Updated Vital Signs BP (!) 182/61 (BP Location: Right Arm)   Pulse (!) 59   Temp 98.1 F (36.7 C) (Oral)   Resp 18   Ht 1.676 m (5\' 6" )   Wt 54.4 kg   SpO2 99%   BMI 19.37 kg/m   Physical Exam Vitals signs and nursing note reviewed.  Constitutional:      Appearance: Normal appearance. She is not ill-appearing.  HENT:     Head: Normocephalic and atraumatic.     Mouth/Throat:     Mouth: Mucous membranes are moist.  Eyes:     Extraocular Movements: Extraocular movements intact.     Conjunctiva/sclera: Conjunctivae normal.     Pupils: Pupils are equal, round, and reactive to light.  Neck:  Musculoskeletal: Neck supple. No neck rigidity.  Cardiovascular:     Rate and Rhythm: Normal rate and regular rhythm.     Pulses: Normal pulses.  Pulmonary:     Effort: Pulmonary effort is normal.     Breath sounds: Normal breath sounds.  Abdominal:     General: Bowel sounds are normal.     Palpations: Abdomen is soft.     Tenderness: There is no abdominal tenderness.  Musculoskeletal: Normal range of motion.        General: No swelling or deformity.     Comments: Dorsalis pedis pulses 2+ both right side and left side.  Good cap refill.  Sensation intact no significant motor weakness.  Skin:    Capillary Refill: Capillary refill takes less than 2 seconds.     Findings: No rash.  Neurological:     General: No focal deficit present.     Mental Status: She is alert and oriented to person, place, and time.     Cranial Nerves: No cranial nerve deficit.     Sensory: No sensory deficit.     Motor: No weakness.      ED Treatments / Results  Labs (all labs ordered are listed, but only abnormal results are displayed) Labs Reviewed - No data to display  EKG None  Radiology Dg Lumbar Spine Complete  Result Date:  01/07/2018 CLINICAL DATA:  84 year old female with low back pain radiating to the left leg onset yesterday. EXAM: LUMBAR SPINE - COMPLETE 4+ VIEW COMPARISON:  CT Abdomen and Pelvis 11/04/2017 and earlier. FINDINGS: Partially visible cardiac pacemaker leads. Calcified aortic atherosclerosis. Normal lumbar segmentation. Stable vertebral height and alignment since October, straightening and mild reversal of lumbar lordosis with mild levoconvex lower lumbar scoliosis. Chronic severe disc degeneration L2-L3 through L4-L5 with vacuum disc and endplate spurring. Intact visible sacrum. SI joints appear normal. Intact visible lower thoracic levels. No acute osseous abnormality identified. Partially visible left hip arthroplasty. Negative abdominal visceral contours. IMPRESSION: 1. No acute osseous abnormality identified in the lumbar spine. 2. Chronic severe L2-L3 through L4-L5 disc and endplate degeneration. 3. Aortic Atherosclerosis (ICD10-I70.0). Electronically Signed   By: Genevie Ann M.D.   On: 01/07/2018 13:16    Procedures Procedures (including critical care time)  Medications Ordered in ED Medications  HYDROcodone-acetaminophen (NORCO/VICODIN) 5-325 MG per tablet 1 tablet (1 tablet Oral Given 01/07/18 1147)     Initial Impression / Assessment and Plan / ED Course  I have reviewed the triage vital signs and the nursing notes.  Pertinent labs & imaging results that were available during my care of the patient were reviewed by me and considered in my medical decision making (see chart for details).    Patient's x-rays of the lumbar back without any acute bony abnormalities.  Patient improved here with hydrocodone.  Will continue that and have her follow-up with her doctors.  Symptoms consistent with left-sided back pain and sciatica.   Final Clinical Impressions(s) / ED Diagnoses   Final diagnoses:  Acute left-sided low back pain with left-sided sciatica    ED Discharge Orders         Ordered     HYDROcodone-acetaminophen (NORCO/VICODIN) 5-325 MG tablet  Every 6 hours PRN     01/07/18 1334           Fredia Sorrow, MD 01/07/18 1335    Fredia Sorrow, MD 01/10/18 1135

## 2018-01-09 DIAGNOSIS — I739 Peripheral vascular disease, unspecified: Secondary | ICD-10-CM | POA: Diagnosis not present

## 2018-01-12 DIAGNOSIS — E063 Autoimmune thyroiditis: Secondary | ICD-10-CM | POA: Diagnosis not present

## 2018-01-12 DIAGNOSIS — R109 Unspecified abdominal pain: Secondary | ICD-10-CM | POA: Diagnosis not present

## 2018-01-12 DIAGNOSIS — Z1389 Encounter for screening for other disorder: Secondary | ICD-10-CM | POA: Diagnosis not present

## 2018-01-12 DIAGNOSIS — K219 Gastro-esophageal reflux disease without esophagitis: Secondary | ICD-10-CM | POA: Diagnosis not present

## 2018-01-12 DIAGNOSIS — K273 Acute peptic ulcer, site unspecified, without hemorrhage or perforation: Secondary | ICD-10-CM | POA: Diagnosis not present

## 2018-01-12 DIAGNOSIS — G5702 Lesion of sciatic nerve, left lower limb: Secondary | ICD-10-CM | POA: Diagnosis not present

## 2018-01-12 DIAGNOSIS — R1013 Epigastric pain: Secondary | ICD-10-CM | POA: Diagnosis not present

## 2018-01-12 DIAGNOSIS — I10 Essential (primary) hypertension: Secondary | ICD-10-CM | POA: Diagnosis not present

## 2018-01-12 DIAGNOSIS — Z681 Body mass index (BMI) 19 or less, adult: Secondary | ICD-10-CM | POA: Diagnosis not present

## 2018-01-12 DIAGNOSIS — I4891 Unspecified atrial fibrillation: Secondary | ICD-10-CM | POA: Diagnosis not present

## 2018-02-01 ENCOUNTER — Ambulatory Visit (INDEPENDENT_AMBULATORY_CARE_PROVIDER_SITE_OTHER): Payer: Medicare Other

## 2018-02-01 ENCOUNTER — Ambulatory Visit (INDEPENDENT_AMBULATORY_CARE_PROVIDER_SITE_OTHER): Payer: Medicare Other | Admitting: Cardiovascular Disease

## 2018-02-01 ENCOUNTER — Encounter: Payer: Self-pay | Admitting: Cardiovascular Disease

## 2018-02-01 VITALS — BP 120/82 | HR 70 | Ht 66.0 in | Wt 123.0 lb

## 2018-02-01 DIAGNOSIS — E441 Mild protein-calorie malnutrition: Secondary | ICD-10-CM

## 2018-02-01 DIAGNOSIS — Z79899 Other long term (current) drug therapy: Secondary | ICD-10-CM

## 2018-02-01 DIAGNOSIS — I951 Orthostatic hypotension: Secondary | ICD-10-CM

## 2018-02-01 DIAGNOSIS — E7800 Pure hypercholesterolemia, unspecified: Secondary | ICD-10-CM | POA: Diagnosis not present

## 2018-02-01 DIAGNOSIS — I495 Sick sinus syndrome: Secondary | ICD-10-CM

## 2018-02-01 DIAGNOSIS — I4819 Other persistent atrial fibrillation: Secondary | ICD-10-CM | POA: Diagnosis not present

## 2018-02-01 DIAGNOSIS — Z950 Presence of cardiac pacemaker: Secondary | ICD-10-CM

## 2018-02-01 DIAGNOSIS — Z7901 Long term (current) use of anticoagulants: Secondary | ICD-10-CM | POA: Diagnosis not present

## 2018-02-01 MED ORDER — AMIODARONE HCL 200 MG PO TABS: 200.0000 mg | ORAL_TABLET | ORAL | 3 refills | Status: DC

## 2018-02-01 NOTE — Patient Instructions (Signed)
Medication Instructions:  Dr Sallyanne Kuster has recommended making the following medication changes: 1. DECREASE Amiodarone to EVERY OTHER DAY  If you need a refill on your cardiac medications before your next appointment, please call your pharmacy.   Lab work: Your physician recommends that you return for lab work at your convenience.  If you have labs (blood work) drawn today and your tests are completely normal, you will receive your results only by: Marland Kitchen MyChart Message (if you have MyChart) OR . A paper copy in the mail If you have any lab test that is abnormal or we need to change your treatment, we will call you to review the results.  Testing/Procedures: Remote monitoring is used to monitor your Pacemaker or ICD from home. This monitoring reduces the number of office visits required to check your device to one time per year. It allows [place] to keep an eye on the functioning of your device to ensure it is working properly. You are scheduled for a device check from home on Thursday, April 9th, 2020. You may send your transmission at any time that day. If you have a wireless device, the transmission will be sent automatically. After your physician reviews your transmission, you will receive a postcard with your next transmission date.  To improve our patient care and to more adequately follow your device, [facility] has decided, as a practice, to start following each patient four times a year with your home monitor. This means that you may experience a remote appointment that is close to an in-office appointment with your physician. Your insurance will apply at the same rate as other remote monitoring transmissions.  Follow-Up: At [facility], you and your health needs are our priority.  As part of our continuing mission to provide you with exceptional heart care, we have created designated Provider Care Teams.  These Care Teams include your primary Cardiologist (physician) and Advanced Practice  Providers (APPs -  Physician Assistants and Nurse Practitioners) who all work together to provide you with the care you need, when you need it. You will need a follow up appointment in 12 months.  Please call our office 2 months in advance to schedule this appointment.  You may see [name], MD or one of the following Advanced Practice Providers on your designated Care Team: [place], [place] . Fabian Sharp, PA-C . You will receive a reminder letter in the mail two months in advance. If you don't receive a letter, please call our office to schedule the follow-up appointment.

## 2018-02-01 NOTE — Progress Notes (Signed)
Patient ID: Abigail Taylor, female   DOB: August 02, 1931, 85 y.o.   MRN: [id number]    Cardiology Office Note    Date:  02/01/2018   ID:  Abigail Taylor, DOB 24-Jun-1931, MRN [id number]  PCP:  [name], MD  Cardiologist:   [name], MD   Chief Complaint  Patient presents with  . Pacemaker Check  . Atrial Fibrillation    History of Present Illness:  Abigail Taylor is a 85 y.o. female with a history of sinus node dysfunction, recurrent paroxysmal and persistent atrial fibrillation with rapid ventricular response, tachycardia-bradycardia syndrome, status post implantation of a dual-chamber permanent pacemaker (Medtronic Adapta), Orthostatic hypotension related to Parkinson's syndrome/Autonomic dysfunction.    Still has not had any interval cardiovascular problems but has had 3 falls, thankfully without serious consequences.  She did not have a head injury and did not have any recurrence of her previous hip fracture. All of the falls were associated with her unsteady gait and balance problems, none were preceded by dizziness or syncope.  She did have some evidence of occult gastrointestinal blood loss and underwent endoscopy in October.  No abnormalities were found and she is continuing her anticoagulant.  Her hemoglobin level was normal.  Liver function tests and thyroid tests were normal in September.  Normal pacemaker function. Her Medtronic Adapta dual-chamber device was implanted in 2012 and has roughly 9 years of remaining longevity. She is not pacemaker dependent and has roughly 50 % atrial pacing with rather blunted heart rate histograms, does not require ventricular pacing (less than 0.1%). The burden of atrial fibrillation is 2.2%, most entirely represented by a single episode of persistent atrial fibrillation lasting for 7 days in early September 19.  Ventricular rate control during the episode of atrial fibrillation was mediocre with an average ventricular rate of about 125 bpm.   The arrhythmia resolved during her hospitalization in early September and amiodarone, and has not recurred since rate control medication use has been limited by her hypotension.  Complains of abdominal discomfort every time she takes the amiodarone.  She is taking anticoagulation with Eliquis without any bleeding complications (lower dose for age and low body weight). She is on long-standing treatment with statin for hyperlipidemia. She had normal coronary arteries by remote angiogram performed in 2001 and a normal nuclear stress test in 2013.    Past Medical History:  Diagnosis Date  . Angina at rest, with the tachycardia 07/05/2011  . Aortic insufficiency 07/05/2011  . Atrial fibrillation ([place])   . Brady-tachy syndrome ([place])    Dual chamber Medtronic pacemaker Adapta  . Chronic back pain   . Constipation 12/27/2011  . Gait instability   . Hyperlipidemia   . Hypothyroidism   . Normal cardiac stress test 2013  . OA (osteoarthritis)   . Pacemaker   . Parkinson's disease ([place])   . Tremor     Past Surgical History:  Procedure Laterality Date  . ABDOMINAL HYSTERECTOMY    . CARDIAC CATHETERIZATION  05/26/1999   normal  . CARDIAC CATHETERIZATION  2001   normal coronary arteries  . cataract    . COLONOSCOPY N/A 11/15/2012   Procedure: COLONOSCOPY;  Surgeon: [name], MD;  [facility];  Service: Endoscopy;  Laterality: N/A;  1030  . ESOPHAGOGASTRODUODENOSCOPY (EGD) WITH PROPOFOL N/A 11/06/2017   Procedure: ESOPHAGOGASTRODUODENOSCOPY (EGD) WITH PROPOFOL;  Surgeon: [name], MD;  Location: [facility];  Service: Endoscopy;  Laterality: N/A;  . HIP ARTHROPLASTY Left  06/24/2016   Procedure: ARTHROPLASTY BIPOLAR HIP (HEMIARTHROPLASTY);  Surgeon: Altamese Osgood, MD;  Location: [facility];  Service: Orthopedics;  Laterality: Left;  . INSERT / REPLACE / REMOVE PACEMAKER    . NM MYOVIEW LTD  07/19/2011   normal  . PERMANENT PACEMAKER INSERTION  07/05/2011   Medtronic Adapta  dual chamber  . PERMANENT PACEMAKER INSERTION N/A 07/05/2011   Procedure: PERMANENT PACEMAKER INSERTION;  Surgeon: [name], MD;  Location: [facility];  Service: Cardiovascular;  Laterality: N/A;    Current Medications: Outpatient Medications Prior to Visit  Medication Sig Dispense Refill  . carbidopa-levodopa (SINEMET IR) 25-100 MG tablet Take 1 tablet by mouth 3 (three) times daily with meals. 90 tablet 2  . ELIQUIS 2.5 MG TABS tablet TAKE 1 TABLET BY MOUTH TWICE A DAY (Patient taking differently: Take 2.5 mg by mouth 2 (two) times daily. ) 180 tablet 1  . fludrocortisone (FLORINEF) 0.1mg /mL SUSP Take 0.1 mg by mouth daily. (pill)    . levothyroxine (SYNTHROID, LEVOTHROID) 75 MCG tablet Take 75 mcg by mouth daily before breakfast.    . LINZESS 145 MCG CAPS capsule Take 145 mcg by mouth daily.  11  . rosuvastatin (CRESTOR) 10 MG tablet Take 10 mg by mouth at bedtime. Reported on 07/09/2015    . XIIDRA 5 % SOLN Place 1 drop into the left eye 2 (two) times daily.     Marland Kitchen amiodarone (PACERONE) 200 MG tablet Take 200 mg daily with largest meal (Patient taking differently: Take 200 mg by mouth daily. Take 200 mg daily with largest meal) 90 tablet 3  . bisacodyl (BISACODYL) 5 MG EC tablet Take 10 mg by mouth 2 (two) times daily as needed for moderate constipation.    Marland Kitchen HYDROcodone-acetaminophen (NORCO/VICODIN) 5-325 MG tablet Take 1 tablet by mouth every 6 (six) hours as needed. 14 tablet 0   No facility-administered medications prior to visit.      Allergies:   Patient has no known allergies.   Social History   Socioeconomic History  . Marital status: Married    Spouse name: Not on file  . Number of children: Not on file  . Years of education: Not on file  . Highest education level: Not on file  Occupational History  . 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 Financial resource strain: Not on file  . Food insecurity:    Worry: Not on file     Inability: Not on file  . Transportation needs:    Medical: Not on file    Non-medical: Not on file  Tobacco Use  . Smoking status: Never Smoker  . Smokeless tobacco: Never Used  Substance and Sexual Activity  . Alcohol use: No    Alcohol/week: 0.0 standard drinks  . Drug use: No  . Sexual activity: Not on file  Lifestyle  . Physical activity:    Days per week: Not on file    Minutes per session: Not on file  . Stress: Not on file  Relationships  . Social connections:    Talks on phone: Not on file    Gets together: Not on file    Attends religious service: Not on file    Active member of club or organization: Not on file    Attends meetings of clubs or organizations: Not on file    Relationship status: Not on file  Other Topics Concern  . Not on file  Social History Narrative  .  Not on file     Family History:  The patient's family history includes Cancer in her father; Colon cancer (age of onset: 44) in her mother; Heart attack in her mother; Suicidality in her son.   ROS:   Please see the history of present illness.    ROS All other systems reviewed and are negative.   PHYSICAL EXAM:   VS:  BP 120/82   Pulse 70   Ht 5\' 6"  (1.676 m)   Wt 123 lb (55.8 kg)   BMI 19.85 kg/m      General: Alert, oriented x3, no distress, she appears thin and rather frail, well-healed left subclavian pacemaker site Head: no evidence of trauma, PERRL, EOMI, no exophtalmos or lid lag, no myxedema, no xanthelasma; normal ears, nose and oropharynx Neck: normal jugular venous pulsations and no hepatojugular reflux; brisk carotid pulses without delay and no carotid bruits Chest: clear to auscultation, no signs of consolidation by percussion or palpation, normal fremitus, symmetrical and full respiratory excursions Cardiovascular: normal position and quality of the apical impulse, regular rhythm, normal first and widely split second heart sounds, no murmurs, rubs or gallops Abdomen: no  tenderness or distention, no masses by palpation, no abnormal pulsatility or arterial bruits, normal bowel sounds, no hepatosplenomegaly Extremities: no clubbing, cyanosis or edema; 2+ radial, ulnar and brachial pulses bilaterally; 2+ right femoral, posterior tibial and dorsalis pedis pulses; 2+ left femoral, posterior tibial and dorsalis pedis pulses; no subclavian or femoral bruits Neurological: grossly nonfocal, but with mild resting tremor and with a very slow movements and slow expression changes consistent with Parkinson's disease Psych: Normal mood and affect   Wt Readings from Last 3 Encounters:  02/01/18 123 lb (55.8 kg)  01/07/18 120 lb (54.4 kg)  11/21/17 119 lb 3.2 oz (54.1 kg)      Studies/Labs Reviewed:   EKG:  EKG is not ordered today.  The cardiac electrogram shows atrial paced, ventricular sensed rhythm Recent Labs: 09/29/2017: B Natriuretic Peptide 692.0; TSH 0.799 11/04/2017: ALT 13 11/07/2017: BUN 9; Creatinine, Ser 0.78; Magnesium 1.9; Platelets 202; Potassium 3.2; Sodium 141 11/21/2017: Hemoglobin 14.5   Lipid Panel    Component Value Date/Time   CHOL 133 04/30/2015 0853   TRIG 84 04/30/2015 0853   HDL 54 04/30/2015 0853   CHOLHDL 2.5 04/30/2015 0853   VLDL 17 04/30/2015 0853   LDLCALC 62 04/30/2015 0853   Most recent labs from PCP  TSH 3.42, creatinine 0.75, glucose 98, hemoglobin 14.4 Total cholesterol 135, HDL 47, LDL 60, triglycerides 141  ASSESSMENT:    1. Other persistent atrial fibrillation   2. On amiodarone therapy   3. Long term current use of anticoagulant   4. SSS (sick sinus syndrome) ([place])   5. Pacemaker   6. Orthostatic hypotension   7. Hypercholesterolemia   8. Mild protein-calorie malnutrition ([place])      PLAN:  In order of problems listed above:  1. Afib: Excellent response to amiodarone without any atrial fibrillation recurrence since her hospitalization.  We will try to decrease the dose further to 100 mg daily (or 200 mg  every other day per her preference) to see if this limits her abdominal side effects.   2. Amiodarone: She is concerned about the potential serious side effects of this medication we discussed the fact that there are not a lot of great alternative options, since she cannot take rate control medications due to hypotension.  Normal liver function tests in October and normal TSH in September  2019. 3. Eliquis CHADSVasc 3 (age, gender) , but without previous embolic events.  No bleeding complications.  Benign findings on upper endoscopy.  I am very concerned about her falls, but today she has not had bleeding issues.  The time being will continue anticoagulation. 4. SSS: Is very sedentary because of her neurological problems.  The heart rate histogram is therefore appropriately blunted.  Her symptoms are appropriately controlled with her current pacemaker settings. 5. PM: Normal device function.  Remote downloads every 3 months and yearly office visits 6. Orthostatic hypotension: Avoid any antihypertensive medications, stay well hydrated, avoid caffeine, alcohol and other natural diuretics.  The current dose of fludrocortisone appears to be providing appropriate compensation. 7. HLP: On statin.  LDL at target. 8. Malnutrition: She remains mildly underweight, but her weight has been relatively stable for the last 2 years or so.    Medication Adjustments/Labs and Tests Ordered: Current medicines are reviewed at length with the patient today.  Concerns regarding medicines are outlined above.  Medication changes, Labs and Tests ordered today are listed in the Patient Instructions below. Patient Instructions  Medication Instructions:  Dr Sallyanne Kuster has recommended making the following medication changes: 1. DECREASE Amiodarone to EVERY OTHER DAY  If you need a refill on your cardiac medications before your next appointment, please call your pharmacy.   Lab work: Your physician recommends that you return for  lab work at your convenience.  If you have labs (blood work) drawn today and your tests are completely normal, you will receive your results only by: Marland Kitchen MyChart Message (if you have MyChart) OR . A paper copy in the mail If you have any lab test that is abnormal or we need to change your treatment, we will call you to review the results.  Testing/Procedures: Remote monitoring is used to monitor your Pacemaker or ICD from home. This monitoring reduces the number of office visits required to check your device to one time per year. It allows [place] to keep an eye on the functioning of your device to ensure it is working properly. You are scheduled for a device check from home on Thursday, April 9th, 2020. You may send your transmission at any time that day. If you have a wireless device, the transmission will be sent automatically. After your physician reviews your transmission, you will receive a postcard with your next transmission date.  To improve our patient care and to more adequately follow your device, [facility] has decided, as a practice, to start following each patient four times a year with your home monitor. This means that you may experience a remote appointment that is close to an in-office appointment with your physician. Your insurance will apply at the same rate as other remote monitoring transmissions.  Follow-Up: At [facility], you and your health needs are our priority.  As part of our continuing mission to provide you with exceptional heart care, we have created designated Provider Care Teams.  These Care Teams include your primary Cardiologist (physician) and Advanced Practice Providers (APPs -  Physician Assistants and Nurse Practitioners) who all work together to provide you with the care you need, when you need it. You will need a follow up appointment in 12 months.  Please call our office 2 months in advance to schedule this appointment.  You may see [name], MD or one  of the following Advanced Practice Providers on your designated Care Team: [place], [place] . Fabian Sharp, PA-C . You will receive  a reminder letter in the mail two months in advance. If you don't receive a letter, please call our office to schedule the follow-up appointment.    Signed, [name], MD  02/01/2018 1:23 PM    [facility] [address], [place], [place]  [address] Phone: [phone]; Fax: [phone]

## 2018-02-02 NOTE — Progress Notes (Signed)
Remote pacemaker transmission.   

## 2018-02-03 LAB — CUP PACEART REMOTE DEVICE CHECK
Battery Impedance: 426 Ohm
Battery Remaining Longevity: 111 mo
Battery Voltage: 2.79 V
Brady Statistic AP VP Percent: 0 %
Brady Statistic AP VS Percent: 51 %
Brady Statistic AS VP Percent: 0 %
Brady Statistic AS VS Percent: 49 %
Date Time Interrogation Session: 20200109151524
Implantable Lead Implant Date: 20130611
Implantable Lead Implant Date: 20130611
Implantable Lead Location: 753860
Implantable Lead Model: 5076
Implantable Lead Model: 5076
Implantable Pulse Generator Implant Date: 20130611
Lead Channel Impedance Value: 441 Ohm
Lead Channel Pacing Threshold Amplitude: 0.75 V
Lead Channel Pacing Threshold Amplitude: 0.75 V
Lead Channel Pacing Threshold Pulse Width: 0.4 ms
Lead Channel Setting Pacing Amplitude: 1.5 V
Lead Channel Setting Pacing Amplitude: 2 V
Lead Channel Setting Pacing Pulse Width: 0.4 ms
Lead Channel Setting Sensing Sensitivity: 5.6 mV
MDC IDC LEAD LOCATION: 753859
MDC IDC MSMT LEADCHNL RA PACING THRESHOLD PULSEWIDTH: 0.4 ms
MDC IDC MSMT LEADCHNL RV IMPEDANCE VALUE: 628 Ohm

## 2018-02-11 ENCOUNTER — Other Ambulatory Visit: Payer: Self-pay | Admitting: Neurology

## 2018-02-20 DIAGNOSIS — G903 Multi-system degeneration of the autonomic nervous system: Secondary | ICD-10-CM | POA: Diagnosis not present

## 2018-02-20 DIAGNOSIS — I700 Atherosclerosis of aorta: Secondary | ICD-10-CM | POA: Diagnosis not present

## 2018-02-20 DIAGNOSIS — Z681 Body mass index (BMI) 19 or less, adult: Secondary | ICD-10-CM | POA: Diagnosis not present

## 2018-02-20 DIAGNOSIS — H6991 Unspecified Eustachian tube disorder, right ear: Secondary | ICD-10-CM | POA: Diagnosis not present

## 2018-02-27 LAB — CUP PACEART INCLINIC DEVICE CHECK
Date Time Interrogation Session: 20200204144713
Implantable Lead Implant Date: 20130611
Implantable Lead Implant Date: 20130611
Implantable Lead Location: 753859
Implantable Lead Location: 753860
Implantable Lead Model: 5076
Implantable Lead Model: 5076
Implantable Pulse Generator Implant Date: 20130611

## 2018-03-05 NOTE — Progress Notes (Signed)
Abigail Taylor was seen today in the movement disorders clinic for neurologic consultation at the request of [name], MD.  The consultation is for the evaluation of PD.  The records that were made available to me were reviewed.  Pt previously has seen Dr. Merlene Laughter.  Unfortunately, his records are not present.  Reviewed PCP, cardiology and PT notes.  The first symptom(s) the patient noticed was R hand and foot tremor and this was 2 years.  She is currently on carbidopa/levodopa 25/100 three per day, at 7:30am/12:30pm/bedtime.  She has some trouble getting going in the AM.    04/18/17 update: Patient is seen today in follow-up.  Patient is now on carbidopa/levodopa 25/100 at 7 AM/11 AM/4 PM and we added carbidopa/levodopa 50/200 at bedtime.  This helped her ability to get up in the AM.  Records were reviewed since our last visit.  She did attend physical therapy at [facility].  she also attended ST and thinks that it is really helpful.  She saw her cardiologist on 03/20/2017 and those records are reviewed.  Orthostatic hypotension was addressed, but she is not on any medication for this.  She is having no dizziness. No falls.  No hallucinations.   09/06/17 update: Patient is seen today in follow-up for Parkinson's disease.  She is on carbidopa/levodopa 25/100, 1 tablet 3 times per day and carbidopa/levodopa 50/200 at bedtime.  Pt denies falls.  Pt denies lightheadedness, near syncope.  No hallucinations.  Mood has been good.  She isn't exercising but "I should."  She has decreased appetite.  Weight has been stable.  "I don't drink enough liquids."  She is drinking 1 boost per day.  Voice is a bit weaker.    03/13/18 update: Patient seen today in follow-up for Parkinson's disease.  Numerous records are reviewed and took 35 min to review.  Patients levodopa was increased last visit so patient is now on carbidopa/levodopa 25/100, 2 tablets in the morning, 1 in the afternoon and 1 in the evening, in  addition to carbidopa/levodopa 50/200 at bedtime.  She has had 3 falls since last visit.  With one she tripped over the dog in the middle of the night.  With one she tripped over the curb.  She was in the emergency room in October with a fall (which was the one where she tripped over the curb).  CT head was unremarkable.  No lightheadedness or near syncope.  She does feel she is overall slowing down.  She can no longer write.  Voice is getting softer.  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  No further falls out of the bed.  It is noted that she was in the hospital in September with A. fib with rapid ventricular response.  She was placed on amiodarone.  Recently, she saw cardiology who is trying to decrease the dose of amiodarone.  She doesn't think that it has made her more shaky but "I don't like the medication."   She has attended therapy since our last visit.  Those records are reviewed.  She was in the hospital in October with constipation and possible melena.  GI work-up was unremarkable.  Was put on linzess for constipation but was very expensive for her, although it did help.  She was in the emergency room in December with low back pain.  PREVIOUS MEDICATIONS: none to date  ALLERGIES:  No Known Allergies  CURRENT MEDICATIONS:  Outpatient Encounter Medications  as of 03/13/2018  Medication Sig  . amiodarone (PACERONE) 200 MG tablet Take 1 tablet (200 mg total) by mouth every other day.  . carbidopa-levodopa (SINEMET CR) 50-200 MG tablet TAKE 1 TABLET BY MOUTH EVERYDAY AT BEDTIME (Patient taking differently: Take 1 tablet by mouth at bedtime. )  . carbidopa-levodopa (SINEMET IR) 25-100 MG tablet Take 1 tablet by mouth 3 (three) times daily with meals.  Marland Kitchen ELIQUIS 2.5 MG TABS tablet TAKE 1 TABLET BY MOUTH TWICE A DAY (Patient taking differently: Take 2.5 mg by mouth 2 (two) times daily. )  . levothyroxine (SYNTHROID, LEVOTHROID) 75 MCG tablet Take 75 mcg by mouth  daily before breakfast.  . LINZESS 145 MCG CAPS capsule Take 145 mcg by mouth daily.  . rosuvastatin (CRESTOR) 10 MG tablet Take 10 mg by mouth at bedtime. Reported on 07/09/2015  . XIIDRA 5 % SOLN Place 1 drop into the left eye 2 (two) times daily.   . [DISCONTINUED] fludrocortisone (FLORINEF) 0.1 MG tablet Take 0.1 mg by mouth daily.   . [DISCONTINUED] fludrocortisone (FLORINEF) 0.1mg /mL SUSP Take 0.1 mg by mouth daily. (pill)  . [DISCONTINUED] fluticasone (FLONASE) 50 MCG/ACT nasal spray Place 2 sprays into both nostrils daily as needed for allergies or rhinitis.   . [DISCONTINUED] methocarbamol (ROBAXIN) 500 MG tablet Take 1 tablet (500 mg total) by mouth 2 (two) times daily as needed for muscle spasms.  . [DISCONTINUED] pantoprazole (PROTONIX) 40 MG tablet Take 40 mg by mouth 2 (two) times daily as needed (for acid reflux).    No facility-administered encounter medications on file as of 03/13/2018.     PAST MEDICAL HISTORY:   Past Medical History:  Diagnosis Date  . Angina at rest, with the tachycardia 07/05/2011  . Aortic insufficiency 07/05/2011  . Atrial fibrillation ([place])   . Brady-tachy syndrome ([place])    Dual chamber Medtronic pacemaker Adapta  . Chronic back pain   . Constipation 12/27/2011  . Gait instability   . Hyperlipidemia   . Hypothyroidism   . Normal cardiac stress test 2013  . OA (osteoarthritis)   . Pacemaker   . Parkinson's disease ([place])   . Tremor     PAST SURGICAL HISTORY:   Past Surgical History:  Procedure Laterality Date  . ABDOMINAL HYSTERECTOMY    . CARDIAC CATHETERIZATION  05/26/1999   normal  . CARDIAC CATHETERIZATION  2001   normal coronary arteries  . cataract    . COLONOSCOPY N/A 11/15/2012   Procedure: COLONOSCOPY;  Surgeon: [name], MD;  [facility];  Service: Endoscopy;  Laterality: N/A;  1030  . ESOPHAGOGASTRODUODENOSCOPY (EGD) WITH PROPOFOL N/A 11/06/2017   Procedure: ESOPHAGOGASTRODUODENOSCOPY (EGD) WITH PROPOFOL;   Surgeon: [name], MD;  [facility];  Service: Endoscopy;  Laterality: N/A;  . HIP ARTHROPLASTY Left 06/24/2016   Procedure: ARTHROPLASTY BIPOLAR HIP (HEMIARTHROPLASTY);  Surgeon: Altamese Crewe, MD;  Location: [facility];  Service: Orthopedics;  Laterality: Left;  . INSERT / REPLACE / REMOVE PACEMAKER    . NM MYOVIEW LTD  07/19/2011   normal  . PERMANENT PACEMAKER INSERTION  07/05/2011   Medtronic Adapta dual chamber  . PERMANENT PACEMAKER INSERTION N/A 07/05/2011   Procedure: PERMANENT PACEMAKER INSERTION;  Surgeon: [name], MD;  Location: [place] CATH LAB;  Service: Cardiovascular;  Laterality: N/A;    SOCIAL HISTORY:   Social History   Socioeconomic History  . Marital status: Married    Spouse name: Not on file  . Number of children: Not  on file  . Years of education: Not on file  . Highest education level: Not on file  Occupational History  . 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 Financial resource strain: Not on file  . Food insecurity:    Worry: Not on file    Inability: Not on file  . Transportation needs:    Medical: Not on file    Non-medical: Not on file  Tobacco Use  . Smoking status: Never Smoker  . Smokeless tobacco: Never Used  Substance and Sexual Activity  . Alcohol use: No    Alcohol/week: 0.0 standard drinks  . Drug use: No  . Sexual activity: Not on file  Lifestyle  . Physical activity:    Days per week: Not on file    Minutes per session: Not on file  . Stress: Not on file  Relationships  . Social connections:    Talks on phone: Not on file    Gets together: Not on file    Attends religious service: Not on file    Active member of club or organization: Not on file    Attends meetings of clubs or organizations: Not on file    Relationship status: Not on file  . Intimate partner violence:    Fear of current or ex partner: Not on file    Emotionally abused: Not on file    Physically abused: Not  on file    Forced sexual activity: Not on file  Other Topics Concern  . Not on file  Social History Narrative  . Not on file    FAMILY HISTORY:   Family Status  Relation Name Status  . Mother  Deceased       MI, colon cancer at age 35  . Father  Deceased       Kidney cancer  . Sister 2 Deceased       etoh abuse, one had kidney cancer  . Brother 1 Alive       good health  . Son [employer]       good health  . Daughter 1 Alive       good health  . Son  Deceased    ROS: Review of Systems  Constitutional: Negative.   HENT: Negative.   Eyes: Negative.   Respiratory: Negative.   Cardiovascular: Negative.   Gastrointestinal: Negative.   Skin: Negative.      PHYSICAL EXAMINATION:    VITALS:   Vitals:   03/13/18 1059  BP: 136/60  Pulse: 68  SpO2: 97%  Weight: 121 lb (54.9 kg)  Height: 5\' 6"  (1.676 m)   Wt Readings from Last 3 Encounters:  03/13/18 121 lb (54.9 kg)  03/06/18 125 lb (56.7 kg)  02/01/18 123 lb (55.8 kg)    GEN:  The patient appears stated age and is in NAD. HEENT:  Normocephalic, atraumatic.  The mucous membranes are moist. The superficial temporal arteries are without ropiness or tenderness. CV:  RRR Lungs:  CTAB Neck/HEME:  There are no carotid bruits bilaterally.  Neurological examination:  Orientation: The patient is alert and oriented x3. Cranial nerves: There is good facial symmetry.  There is facial hypomimia.  The speech is fluent and clear. Soft palate rises symmetrically and there is no tongue deviation. Hearing is intact to conversational tone. Sensation: Sensation is intact to light touch throughout Motor: Strength is 5/5 in the bilateral upper and lower extremities.   Shoulder shrug is equal  and symmetric.  There is no pronator drift.  Movement examination: Tone: there is mild to mod rigidity in the RUE>LUE Abnormal movements: There is intermittent tremor in the RUE Coordination:  There is decremation, with any form of RAMS,  including alternating supination and pronation of the forearm, hand opening and closing, finger taps, heel taps and toe taps bilaterally.   Gait and Station: The patient has minimal difficulty arising out of a deep-seated chair without the use of the hands. The patient's stride length is slightly decreased.  She has decreased arm swing bilaterally  ASSESSMENT/PLAN:  1.  Idiopathic Parkinson's disease.  The patient has tremor, bradykinesia, rigidity and mild postural instability.  This was dx in approx 2016  -We discussed the diagnosis as well as pathophysiology of the disease.  We discussed treatment options as well as prognostic indicators.  Patient education was provided.  -increase carbidopa/levodopa 25/100, 2/2/1.    -Continue carbidopa/levodopa 50/200 at bedtime.  -Talked about the importance of exercise and doing her physical and speech therapy exercises that she has at home.  2.  RBD  -Patient fell out of bed while asleep in May, 2018 and fractured her hip.  Talked about the fact that REM behavior disorder can obviously be very serious and result in hip fractures and bleeding in the brain.  This should not be ignored and requires treatment.  No further symptoms and has bed rails.  3.  Orthostatic hypotension  -on florinef and doing well.  She is asymptomatic.  -encouraged hydration  4.  Sialorrhea  -This is commonly associated with PD.  We talked about treatments.  The patient is not a candidate for oral anticholinergic therapy because of increased risk of confusion and falls.  We discussed myobloc and 1% atropine drops.  We discusssed that candy like lemon drops can help by stimulating mm of the oropharynx to induce swallowing.  5.  Dry eye  -Seeing ophthalmology, but the drops are too expensive for her to afford.  She is seeing Dr. Valetta Close.  6.constipation  -rancho recipe given  7. Paroxysmal A. Fib.  -She was in the hospital in September, 2019 with rapid ventricular response.  She  was started on amiodarone.  Cardiology is trying to decrease that dosage now.  She and I discussed that amiodarone certainly can increase tremor.  Rarely, it can make parkinsonism worse.  8.  Constipation   -Linzess has helped but also is quite expensive for her and she cannot afford.  Told her to call the prescribing physician.    9.  Follow-up in the next 4 to 5 months, sooner should new neurologic issues arise.  Much greater than 50% of this visit was spent in counseling and coordinating care.  Total face to face time:  25 min.  This did not include the 35 min of record review which was detailed above, which was non face to face time.    Cc:  [name], MD

## 2018-03-06 ENCOUNTER — Encounter (HOSPITAL_COMMUNITY): Payer: Self-pay | Admitting: Emergency Medicine

## 2018-03-06 ENCOUNTER — Emergency Department (HOSPITAL_COMMUNITY)
Admission: EM | Admit: 2018-03-06 | Discharge: 2018-03-06 | Disposition: A | Discharge status: Home / Self Care | Payer: Medicare Other | Attending: Emergency Medicine | Admitting: Emergency Medicine

## 2018-03-06 ENCOUNTER — Other Ambulatory Visit: Payer: Self-pay

## 2018-03-06 ENCOUNTER — Emergency Department (HOSPITAL_COMMUNITY): Payer: Medicare Other

## 2018-03-06 DIAGNOSIS — Y9389 Activity, other specified: Secondary | ICD-10-CM | POA: Diagnosis not present

## 2018-03-06 DIAGNOSIS — Y999 Unspecified external cause status: Secondary | ICD-10-CM | POA: Insufficient documentation

## 2018-03-06 DIAGNOSIS — S22000A Wedge compression fracture of unspecified thoracic vertebra, initial encounter for closed fracture: Secondary | ICD-10-CM

## 2018-03-06 DIAGNOSIS — W0110XA Fall on same level from slipping, tripping and stumbling with subsequent striking against unspecified object, initial encounter: Secondary | ICD-10-CM | POA: Insufficient documentation

## 2018-03-06 DIAGNOSIS — Z950 Presence of cardiac pacemaker: Secondary | ICD-10-CM | POA: Insufficient documentation

## 2018-03-06 DIAGNOSIS — G20 Parkinson's disease: Secondary | ICD-10-CM | POA: Diagnosis not present

## 2018-03-06 DIAGNOSIS — S22060A Wedge compression fracture of T7-T8 vertebra, initial encounter for closed fracture: Secondary | ICD-10-CM | POA: Diagnosis not present

## 2018-03-06 DIAGNOSIS — Y92003 Bedroom of unspecified non-institutional (private) residence as the place of occurrence of the external cause: Secondary | ICD-10-CM | POA: Diagnosis not present

## 2018-03-06 DIAGNOSIS — W19XXXA Unspecified fall, initial encounter: Secondary | ICD-10-CM

## 2018-03-06 DIAGNOSIS — Z96642 Presence of left artificial hip joint: Secondary | ICD-10-CM | POA: Insufficient documentation

## 2018-03-06 DIAGNOSIS — S22068A Other fracture of T7-T8 thoracic vertebra, initial encounter for closed fracture: Secondary | ICD-10-CM | POA: Diagnosis not present

## 2018-03-06 DIAGNOSIS — S4991XA Unspecified injury of right shoulder and upper arm, initial encounter: Secondary | ICD-10-CM | POA: Diagnosis not present

## 2018-03-06 DIAGNOSIS — E039 Hypothyroidism, unspecified: Secondary | ICD-10-CM | POA: Insufficient documentation

## 2018-03-06 DIAGNOSIS — M25511 Pain in right shoulder: Secondary | ICD-10-CM | POA: Diagnosis not present

## 2018-03-06 DIAGNOSIS — S299XXA Unspecified injury of thorax, initial encounter: Secondary | ICD-10-CM | POA: Diagnosis present

## 2018-03-06 DIAGNOSIS — M546 Pain in thoracic spine: Secondary | ICD-10-CM | POA: Diagnosis not present

## 2018-03-06 MED ORDER — METHOCARBAMOL 500 MG PO TABS: 500.0000 mg | ORAL_TABLET | Freq: Two times a day (BID) | ORAL | 0 refills | Status: DC | PRN

## 2018-03-06 MED ORDER — METHOCARBAMOL 500 MG PO TABS
500.0000 mg | ORAL_TABLET | Freq: Once | ORAL | Status: AC
  Administered 2018-03-06: 500 mg via ORAL
  Filled 2018-03-06: qty 1

## 2018-03-06 NOTE — ED Provider Notes (Signed)
[facility] Emergency Department Provider Note MRN:  [id number]  Arrival date & time: 03/06/18     Chief Complaint   Fall   History of Present Illness   [name] is a 85 y.o. year-old female with a history of A. fib, pacemaker on Eliquis presenting to the ED with chief complaint of fall.  4 or 5 days ago, patient had a ground-level fall.  She was getting out of bed, explains that she takes her time getting out of bed making sure that she is not going to get dizzy, make sure that she is fully awake.  She began walking and tripped over the dog bed, causing her to lose her balance and fall.  Patient denies any dizziness or chest pain prior to the fall.  Unsure of head trauma, was helped up by her husband.  Has been taking Tylenol for the past few days to help with pain.  Explains that she is here today because the pain is not improving, pain is located in the right shoulder and right thoracic back, constant but made much worse when being helped up from a seated position.  Denies vomiting, family denies any increased confusion, no numbness or weakness to the arms or legs, no chest pain, no shortness of breath, no abdominal pain, no hip or leg pain.  Review of Systems  A complete 10 system review of systems was obtained and all systems are negative except as noted in the HPI and PMH.   Patient's Health History    Past Medical History:  Diagnosis Date  . Angina at rest, with the tachycardia 07/05/2011  . Aortic insufficiency 07/05/2011  . Atrial fibrillation ([place])   . Brady-tachy syndrome ([place])    Dual chamber Medtronic pacemaker Adapta  . Chronic back pain   . Constipation 12/27/2011  . Gait instability   . Hyperlipidemia   . Hypothyroidism   . Normal cardiac stress test 2013  . OA (osteoarthritis)   . Pacemaker   . Parkinson's disease ([place])   . Tremor     Past Surgical History:  Procedure Laterality Date  . ABDOMINAL HYSTERECTOMY    . CARDIAC CATHETERIZATION   05/26/1999   normal  . CARDIAC CATHETERIZATION  2001   normal coronary arteries  . cataract    . COLONOSCOPY N/A 11/15/2012   Procedure: COLONOSCOPY;  Surgeon: [name], MD;  [facility];  Service: Endoscopy;  Laterality: N/A;  1030  . ESOPHAGOGASTRODUODENOSCOPY (EGD) WITH PROPOFOL N/A 11/06/2017   Procedure: ESOPHAGOGASTRODUODENOSCOPY (EGD) WITH PROPOFOL;  Surgeon: [name], MD;  [facility];  Service: Endoscopy;  Laterality: N/A;  . HIP ARTHROPLASTY Left 06/24/2016   Procedure: ARTHROPLASTY BIPOLAR HIP (HEMIARTHROPLASTY);  Surgeon: Altamese Clearfield, MD;  Location: [place];  Service: Orthopedics;  Laterality: Left;  . INSERT / REPLACE / REMOVE PACEMAKER    . NM MYOVIEW LTD  07/19/2011   normal  . PERMANENT PACEMAKER INSERTION  07/05/2011   Medtronic Adapta dual chamber  . PERMANENT PACEMAKER INSERTION N/A 07/05/2011   Procedure: PERMANENT PACEMAKER INSERTION;  Surgeon: [name], MD;  Location: [facility] CATH LAB;  Service: Cardiovascular;  Laterality: N/A;    Family History  Problem Relation Age of Onset  . Colon cancer Mother 27  . Heart attack Mother   . Cancer Father   . Suicidality Son     Social History   Socioeconomic History  . Marital status: Married    Spouse name: Not on file  .  Number of children: Not on file  . Years of education: Not on file  . Highest education level: Not on file  Occupational History  . 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 Financial resource strain: Not on file  . Food insecurity:    Worry: Not on file    Inability: Not on file  . Transportation needs:    Medical: Not on file    Non-medical: Not on file  Tobacco Use  . Smoking status: Never Smoker  . Smokeless tobacco: Never Used  Substance and Sexual Activity  . Alcohol use: No    Alcohol/week: 0.0 standard drinks  . Drug use: No  . Sexual activity: Not on file  Lifestyle  . Physical activity:    Days per week:  Not on file    Minutes per session: Not on file  . Stress: Not on file  Relationships  . Social connections:    Talks on phone: Not on file    Gets together: Not on file    Attends religious service: Not on file    Active member of club or organization: Not on file    Attends meetings of clubs or organizations: Not on file    Relationship status: Not on file  . Intimate partner violence:    Fear of current or ex partner: Not on file    Emotionally abused: Not on file    Physically abused: Not on file    Forced sexual activity: Not on file  Other Topics Concern  . Not on file  Social History Narrative  . Not on file     Physical Exam  Vital Signs and Nursing Notes reviewed Vitals:   03/06/18 1629  BP: (!) 184/73  Pulse: 65  Resp: 20  Temp: 98.3 F (36.8 C)  SpO2: 98%    CONSTITUTIONAL: Well-appearing, NAD NEURO:  Alert and oriented x 3, no focal deficits EYES:  eyes equal and reactive ENT/NECK:  no LAD, no JVD CARDIO: Regular rate, well-perfused, normal S1 and S2 PULM:  CTAB no wheezing or rhonchi GI/GU:  normal bowel sounds, non-distended, non-tender MSK/SPINE:  No gross deformities, no edema; right thoracic paraspinal tenderness palpation, no tenderness palpation to the cervical or lumbar spine, normal range of motion of the neck; mild tenderness to palpation of the right shoulder, preserved range of motion SKIN:  no rash, atraumatic PSYCH:  Appropriate speech and behavior  Diagnostic and Interventional Summary    Labs Reviewed - No data to display  DG Thoracic Spine 2 View  Final Result    DG Shoulder Right  Final Result      Medications  methocarbamol (ROBAXIN) tablet 500 mg (500 mg Oral Given 03/06/18 1804)     Procedures Critical Care  ED Course and Medical Decision Making  I have reviewed the triage vital signs and the nursing notes.  Pertinent labs & imaging results that were available during my care of the patient were reviewed by me and  considered in my medical decision making (see below for details).  Mechanical ground-level fall 4 to 5 days ago and is anticoagulated 85 year old female.  Unsure of head trauma but no evidence of head trauma today, no vomiting, no neurological deficits, little to no concern for significant intracranial injury and patient has largely withstood the test time, no indication for CT imaging of the head today.  Pain in the back is largely paraspinal, will x-ray to screen for compression fracture.  Patient has bilateral breath sounds, no chest pain or shortness of breath.  We will also x-ray of the shoulder given her pain there, low concern for fracture.  X-rays largely unrevealing.  There is an age-indeterminate compression fracture, however patient is not having any neurological deficits, no bowel or bladder dysfunction, patient is nontender in the midline, so this is favored to be chronic.  Provided with short course muscle relaxer, will follow-up with PCP.  After the discussed management above, the patient was determined to be safe for discharge.  The patient was in agreement with this plan and all questions regarding their care were answered.  ED return precautions were discussed and the patient will return to the ED with any significant worsening of condition.  Barth Kirks. Sedonia Small, [place] [email or web address]  Final Clinical Impressions(s) / ED Diagnoses     ICD-10-CM   1. Fall, initial encounter W19.XXXA   2. Compression fracture of body of thoracic vertebra [facility]) S22.000A     ED Discharge Orders    None         Maudie Flakes, MD 03/06/18 1845

## 2018-03-06 NOTE — ED Notes (Signed)
EDP at bedside updating patient and family. 

## 2018-03-06 NOTE — ED Triage Notes (Addendum)
Patient states she fell on Saturday. States she tripped over her dog. Complaining of upper back pain radiating into right shoulder. Denies head injury or LOC. Patient ambulatory at triage.

## 2018-03-06 NOTE — Discharge Instructions (Addendum)
You were evaluated in the Emergency Department and after careful evaluation, we did not find any emergent condition requiring admission or further testing in the hospital.  Your symptoms today seem to be due to bruising or muscle strain from the fall.  Your x-ray did reveal a broken bone in your back, but it is unclear whether this was caused from the fall or whether it has been present for a while.  Regardless, it is not an emergency at this time and you can take the medication provided to help with your symptoms at home.  Please return to the Emergency Department if you experience any worsening of your condition.  We encourage you to follow up with a primary care provider.  Thank you for allowing [place] to be a part of your care.

## 2018-03-06 NOTE — ED Notes (Signed)
Patient transported to X-ray 

## 2018-03-13 ENCOUNTER — Ambulatory Visit (INDEPENDENT_AMBULATORY_CARE_PROVIDER_SITE_OTHER): Payer: Medicare Other | Admitting: Neurology

## 2018-03-13 ENCOUNTER — Encounter: Payer: Self-pay | Admitting: Neurology

## 2018-03-13 VITALS — BP 136/60 | HR 68 | Ht 66.0 in | Wt 121.0 lb

## 2018-03-13 DIAGNOSIS — K5901 Slow transit constipation: Secondary | ICD-10-CM | POA: Diagnosis not present

## 2018-03-13 DIAGNOSIS — H04129 Dry eye syndrome of unspecified lacrimal gland: Secondary | ICD-10-CM | POA: Diagnosis not present

## 2018-03-13 DIAGNOSIS — G20 Parkinson's disease: Secondary | ICD-10-CM

## 2018-03-13 DIAGNOSIS — G903 Multi-system degeneration of the autonomic nervous system: Secondary | ICD-10-CM

## 2018-03-13 MED ORDER — CARBIDOPA-LEVODOPA 25-100 MG PO TABS: ORAL_TABLET | ORAL | 1 refills | Status: DC

## 2018-03-13 NOTE — Patient Instructions (Signed)
Increase carbidopa/levodopa 25/100, 2 tablets at 7am/2 at 11am/1 tablet at 4pm  Continue carbidopa/levodopa 50/200 at bedtime

## 2018-03-29 ENCOUNTER — Ambulatory Visit (INDEPENDENT_AMBULATORY_CARE_PROVIDER_SITE_OTHER): Payer: Medicare Other | Admitting: Otolaryngology

## 2018-03-29 DIAGNOSIS — H6981 Other specified disorders of Eustachian tube, right ear: Secondary | ICD-10-CM

## 2018-03-29 DIAGNOSIS — H903 Sensorineural hearing loss, bilateral: Secondary | ICD-10-CM | POA: Diagnosis not present

## 2018-03-29 DIAGNOSIS — H9071 Mixed conductive and sensorineural hearing loss, unilateral, right ear, with unrestricted hearing on the contralateral side: Secondary | ICD-10-CM

## 2018-04-03 DIAGNOSIS — I739 Peripheral vascular disease, unspecified: Secondary | ICD-10-CM | POA: Diagnosis not present

## 2018-04-22 ENCOUNTER — Other Ambulatory Visit: Payer: Self-pay | Admitting: Cardiovascular Disease

## 2018-05-03 ENCOUNTER — Telehealth: Payer: Self-pay | Admitting: Cardiovascular Disease

## 2018-05-03 ENCOUNTER — Other Ambulatory Visit: Payer: Self-pay

## 2018-05-03 ENCOUNTER — Telehealth: Payer: Self-pay

## 2018-05-03 ENCOUNTER — Ambulatory Visit (INDEPENDENT_AMBULATORY_CARE_PROVIDER_SITE_OTHER): Payer: Medicare Other | Admitting: *Deleted

## 2018-05-03 DIAGNOSIS — I495 Sick sinus syndrome: Secondary | ICD-10-CM | POA: Diagnosis not present

## 2018-05-03 NOTE — Telephone Encounter (Signed)
Per pt call wants to confirm the transmission went through.  Pleas give her a call back with this information per pt.   Thank you!

## 2018-05-03 NOTE — Telephone Encounter (Signed)
Left message for patient to remind of missed remote transmission.  

## 2018-05-04 LAB — CUP PACEART REMOTE DEVICE CHECK
Battery Impedance: 451 Ohm
Battery Remaining Longevity: 108 mo
Battery Voltage: 2.79 V
Brady Statistic AP VP Percent: 0 %
Brady Statistic AP VS Percent: 58 %
Brady Statistic AS VP Percent: 0 %
Brady Statistic AS VS Percent: 42 %
Date Time Interrogation Session: 20200409202942
Implantable Lead Implant Date: 20130611
Implantable Lead Implant Date: 20130611
Implantable Lead Location: 753859
Implantable Lead Location: 753860
Implantable Lead Model: 5076
Implantable Lead Model: 5076
Implantable Pulse Generator Implant Date: 20130611
Lead Channel Impedance Value: 436 Ohm
Lead Channel Impedance Value: 642 Ohm
Lead Channel Pacing Threshold Amplitude: 0.75 V
Lead Channel Pacing Threshold Amplitude: 0.75 V
Lead Channel Pacing Threshold Pulse Width: 0.4 ms
Lead Channel Pacing Threshold Pulse Width: 0.4 ms
Lead Channel Setting Pacing Amplitude: 1.5 V
Lead Channel Setting Pacing Amplitude: 2 V
Lead Channel Setting Pacing Pulse Width: 0.4 ms
Lead Channel Setting Sensing Sensitivity: 5.6 mV

## 2018-05-04 NOTE — Telephone Encounter (Signed)
Transmission received - left message.   Chanetta Marshall, NP 05/04/2018 10:11 AM

## 2018-05-09 ENCOUNTER — Other Ambulatory Visit: Payer: Self-pay | Admitting: Neurology

## 2018-05-11 ENCOUNTER — Encounter: Payer: Self-pay | Admitting: Cardiology

## 2018-05-11 NOTE — Progress Notes (Signed)
Remote pacemaker transmission.   

## 2018-05-11 NOTE — Progress Notes (Signed)
Letter  

## 2018-05-17 ENCOUNTER — Other Ambulatory Visit: Payer: Self-pay | Admitting: General Surgery

## 2018-05-17 MED ORDER — CARBIDOPA-LEVODOPA ER 50-200 MG PO TBCR: EXTENDED_RELEASE_TABLET | ORAL | 0 refills | Status: DC

## 2018-05-28 DIAGNOSIS — H04123 Dry eye syndrome of bilateral lacrimal glands: Secondary | ICD-10-CM | POA: Diagnosis not present

## 2018-06-07 DIAGNOSIS — S80812A Abrasion, left lower leg, initial encounter: Secondary | ICD-10-CM | POA: Diagnosis not present

## 2018-06-07 DIAGNOSIS — Z681 Body mass index (BMI) 19 or less, adult: Secondary | ICD-10-CM | POA: Diagnosis not present

## 2018-06-07 DIAGNOSIS — Z1389 Encounter for screening for other disorder: Secondary | ICD-10-CM | POA: Diagnosis not present

## 2018-06-19 DIAGNOSIS — I739 Peripheral vascular disease, unspecified: Secondary | ICD-10-CM | POA: Diagnosis not present

## 2018-06-26 ENCOUNTER — Emergency Department (HOSPITAL_COMMUNITY)
Admission: EM | Admit: 2018-06-26 | Discharge: 2018-06-26 | Disposition: A | Discharge status: Home / Self Care | Payer: Medicare Other | Attending: Emergency Medicine | Admitting: Emergency Medicine

## 2018-06-26 ENCOUNTER — Other Ambulatory Visit: Payer: Self-pay

## 2018-06-26 ENCOUNTER — Encounter (HOSPITAL_COMMUNITY): Payer: Self-pay

## 2018-06-26 DIAGNOSIS — Z950 Presence of cardiac pacemaker: Secondary | ICD-10-CM | POA: Diagnosis not present

## 2018-06-26 DIAGNOSIS — I2510 Atherosclerotic heart disease of native coronary artery without angina pectoris: Secondary | ICD-10-CM | POA: Insufficient documentation

## 2018-06-26 DIAGNOSIS — Z7901 Long term (current) use of anticoagulants: Secondary | ICD-10-CM | POA: Diagnosis not present

## 2018-06-26 DIAGNOSIS — Z96642 Presence of left artificial hip joint: Secondary | ICD-10-CM | POA: Diagnosis not present

## 2018-06-26 DIAGNOSIS — G20 Parkinson's disease: Secondary | ICD-10-CM | POA: Insufficient documentation

## 2018-06-26 DIAGNOSIS — E039 Hypothyroidism, unspecified: Secondary | ICD-10-CM | POA: Insufficient documentation

## 2018-06-26 DIAGNOSIS — Z79899 Other long term (current) drug therapy: Secondary | ICD-10-CM | POA: Diagnosis not present

## 2018-06-26 DIAGNOSIS — I10 Essential (primary) hypertension: Secondary | ICD-10-CM | POA: Insufficient documentation

## 2018-06-26 DIAGNOSIS — K5900 Constipation, unspecified: Secondary | ICD-10-CM | POA: Insufficient documentation

## 2018-06-26 MED ORDER — SORBITOL 70 % SOLN
960.0000 mL | TOPICAL_OIL | Freq: Once | ORAL | Status: AC
  Administered 2018-06-26: 960 mL via RECTAL
  Filled 2018-06-26: qty 473

## 2018-06-26 NOTE — ED Triage Notes (Signed)
Pt presents to ED with complaints of constipation since Friday. Pt states she has been using suppositories but has been unsuccessful. Pt denies abdominal pain.

## 2018-06-26 NOTE — ED Provider Notes (Signed)
[facility] EMERGENCY DEPARTMENT Provider Note   CSN: [id number] Arrival date & time: 06/26/18  0857    History   Chief Complaint Chief Complaint  Patient presents with  . Constipation    HPI Abigail Taylor is a 85 y.o. female.     HPI   She presents for evaluation of decreased stooling for 4 days, despite using glycerin suppository if "laxative," with a small amount of stool passed yesterday.  She has a history of fecal impaction and chronic constipation for at least 6 months.  In the last few days she has had decreased appetite but has been able to eat and drink.  She does not know the type of treatment she has used in the past for constipation.  She denies fever, chills, cough, shortness of breath, weakness or dizziness.  There are no other known modifying factors.  Past Medical History:  Diagnosis Date  . Angina at rest, with the tachycardia 07/05/2011  . Aortic insufficiency 07/05/2011  . Atrial fibrillation ([place])   . Brady-tachy syndrome ([place])    Dual chamber Medtronic pacemaker Adapta  . Chronic back pain   . Constipation 12/27/2011  . Gait instability   . Hyperlipidemia   . Hypothyroidism   . Normal cardiac stress test 2013  . OA (osteoarthritis)   . Pacemaker   . Parkinson's disease ([place])   . Tremor     Patient Active Problem List   Diagnosis Date Noted  . Anticoagulated   . Epigastric pain   . Diverticulosis   . Melena 11/04/2017  . Atrial fibrillation with RVR ([place]) 09/29/2017  . RBD (REM behavioral disorder) 01/10/2017  . Neurogenic orthostatic hypotension ([place]) 01/10/2017  . Tachy-brady syndrome ([place]) 06/23/2016  . Closed subcapital fracture of left femur ([place]) 06/23/2016  . Orthostatic hypotension due to Parkinson's disease ([place]) 06/23/2016  . PD (Parkinson's disease) ([place]) 04/21/2016  . Hypokalemia 04/21/2016  . Normal coronary arteries 2001 05/08/2014  . Pacemaker 08/10/2012  . Aortic insufficiency 08/10/2012  . Constipation 12/27/2011  .  Family hx of colon cancer 12/27/2011  . HTN (hypertension) 07/06/2011  . Hypothyroidism 07/05/2011  . Paroxysmal atrial fibrillation ([place]) 07/05/2011  . Sinus pause, 7 seconds post conversion, S/P MDT pacemaker 07/05/11 07/05/2011  . Dyslipidemia 07/05/2011  . Angina at rest, with rapid AF 07/05/2011    Past Surgical History:  Procedure Laterality Date  . ABDOMINAL HYSTERECTOMY    . CARDIAC CATHETERIZATION  05/26/1999   normal  . CARDIAC CATHETERIZATION  2001   normal coronary arteries  . cataract    . COLONOSCOPY N/A 11/15/2012   Procedure: COLONOSCOPY;  Surgeon: [name], MD;  [facility];  Service: Endoscopy;  Laterality: N/A;  1030  . ESOPHAGOGASTRODUODENOSCOPY (EGD) WITH PROPOFOL N/A 11/06/2017   Procedure: ESOPHAGOGASTRODUODENOSCOPY (EGD) WITH PROPOFOL;  Surgeon: [name], MD;  [facility];  Service: Endoscopy;  Laterality: N/A;  . HIP ARTHROPLASTY Left 06/24/2016   Procedure: ARTHROPLASTY BIPOLAR HIP (HEMIARTHROPLASTY);  Surgeon: Altamese Emporium, MD;  Location: [place];  Service: Orthopedics;  Laterality: Left;  . INSERT / REPLACE / REMOVE PACEMAKER    . NM MYOVIEW LTD  07/19/2011   normal  . PERMANENT PACEMAKER INSERTION  07/05/2011   Medtronic Adapta dual chamber  . PERMANENT PACEMAKER INSERTION N/A 07/05/2011   Procedure: PERMANENT PACEMAKER INSERTION;  Surgeon: [name], MD;  Location: [place] CATH LAB;  Service: Cardiovascular;  Laterality: N/A;     OB History   No obstetric history on  file.      Home Medications    Prior to Admission medications   Medication Sig Start Date End Date Taking? Authorizing Provider  amiodarone (PACERONE) 200 MG tablet Take 1 tablet (200 mg total) by mouth every other day. 02/01/18   Croitoru, Mihai, MD  carbidopa-levodopa (SINEMET CR) 50-200 MG tablet TAKE 1 TABLET BY MOUTH EVERYDAY AT BEDTIME 05/17/18   Tat, Rebecca S, DO  carbidopa-levodopa (SINEMET IR) 25-100 MG tablet 2 at 7am/2 at 11am/1 at 4pm 03/13/18    Tat, Rebecca S, DO  ELIQUIS 2.5 MG TABS tablet TAKE 1 TABLET BY MOUTH TWICE A DAY Patient taking differently: Take 2.5 mg by mouth 2 (two) times daily.  10/31/17   Croitoru, Mihai, MD  fludrocortisone (FLORINEF) 0.1 MG tablet TAKE 1 TABLET BY MOUTH EVERY DAY 04/23/18   Croitoru, Mihai, MD  levothyroxine (SYNTHROID, LEVOTHROID) 75 MCG tablet Take 75 mcg by mouth daily before breakfast.    [provider]  LINZESS 145 MCG CAPS capsule Take 145 mcg by mouth daily. 12/14/17   [provider]  rosuvastatin (CRESTOR) 10 MG tablet Take 10 mg by mouth at bedtime. Reported on 07/09/2015    [provider]  XIIDRA 5 % SOLN Place 1 drop into the left eye 2 (two) times daily.  10/04/16   [provider]    Family History Family History  Problem Relation Age of Onset  . Colon cancer Mother 16  . Heart attack Mother   . Cancer Father   . Suicidality Son     Social History Social History   Tobacco Use  . Smoking status: Never Smoker  . Smokeless tobacco: Never Used  Substance Use Topics  . Alcohol use: No    Alcohol/week: 0.0 standard drinks  . Drug use: No     Allergies   Patient has no known allergies.   Review of Systems Review of Systems  All other systems reviewed and are negative.    Physical Exam Updated Vital Signs BP (!) 126/51 (BP Location: Left Arm)   Pulse 64   Temp 97.9 F (36.6 C) (Oral)   Resp 16   Ht 5\' 6"  (1.676 m)   Wt 53.5 kg   SpO2 100%   BMI 19.05 kg/m   Physical Exam Vitals signs and nursing note reviewed.  Constitutional:      General: She is not in acute distress.    Appearance: She is well-developed and normal weight. She is not ill-appearing, toxic-appearing or diaphoretic.  HENT:     Head: Normocephalic and atraumatic.     Right Ear: External ear normal.     Left Ear: External ear normal.  Eyes:     Conjunctiva/sclera: Conjunctivae normal.     Pupils: Pupils are equal, round, and reactive to light.  Neck:      Musculoskeletal: Normal range of motion and neck supple.     Trachea: Phonation normal.  Cardiovascular:     Rate and Rhythm: Normal rate.  Pulmonary:     Effort: Pulmonary effort is normal.  Abdominal:     General: There is no distension.     Palpations: Abdomen is soft. There is no mass.     Tenderness: There is no abdominal tenderness.  Genitourinary:    Comments: Normal anus.  No fecal impaction.  Small amount of soft brown stool in the rectal vault. Musculoskeletal: Normal range of motion.  Skin:    General: Skin is warm and dry.  Neurological:  Mental Status: She is alert and oriented to person, place, and time.     Cranial Nerves: No cranial nerve deficit.     Sensory: No sensory deficit.     Motor: No abnormal muscle tone.     Coordination: Coordination normal.  Psychiatric:        Behavior: Behavior normal.        Thought Content: Thought content normal.        Judgment: Judgment normal.      ED Treatments / Results  Labs (all labs ordered are listed, but only abnormal results are displayed) Labs Reviewed - No data to display  EKG None  Radiology No results found.  Procedures Procedures (including critical care time)  Medications Ordered in ED Medications  sorbitol, milk of mag, mineral oil, glycerin (SMOG) enema (960 mLs Rectal Given 06/26/18 1018)     Initial Impression / Assessment and Plan / ED Course  I have reviewed the triage vital signs and the nursing notes.  Pertinent labs & imaging results that were available during my care of the patient were reviewed by me and considered in my medical decision making (see chart for details).         Patient Vitals for the past 24 hrs:  BP Temp Temp src Pulse Resp SpO2 Height Weight  06/26/18 0907 (!) 126/51 97.9 F (36.6 C) Oral 64 16 100 % - -  06/26/18 0905 - - - - - - 5\' 6"  (1.676 m) 53.5 kg    12:30 PM Reevaluation with update and discussion. After initial assessment and treatment, an  updated evaluation reveals she is comfortable now and states she has had multiple stools, following the enema.  She states she is ready to go home.Daleen Bo   Medical Decision Making: Obstipation, chronic, requiring enema for treatment in the ED.  Doubt fecal impaction, serious bacterial infection or impending vascular collapse.  CRITICAL CARE-no Performed by: Daleen Bo  Nursing Notes Reviewed/ Care Coordinated Applicable Imaging Reviewed Interpretation of Laboratory Data incorporated into ED treatment  The patient appears reasonably screened and/or stabilized for discharge and I doubt any other medical condition or other [facility] requiring further screening, evaluation, or treatment in the ED at this time prior to discharge.  Plan: Home Medications-continue current medication and use Colace twice a day; Home Treatments-increase fiber in diet and drink 1 to 2 L of water each day; return here if the recommended treatment, does not improve the symptoms; Recommended follow up-PCP follow-up 1 week and as needed   Final Clinical Impressions(s) / ED Diagnoses   Final diagnoses:  Constipation, unspecified constipation type    ED Discharge Orders    None       Daleen Bo, MD 06/26/18 1232

## 2018-06-26 NOTE — Discharge Instructions (Signed)
Continue treatment for constipation with Colace, 100 mg, twice a day for 1 month.  It is best to avoid stimulant laxatives.  If the Colace does not work, instead try MiraLAX, twice a day for the constipation.  Follow-up with your PCP as needed for problems.

## 2018-07-27 ENCOUNTER — Other Ambulatory Visit: Payer: Self-pay | Admitting: Cardiovascular Disease

## 2018-07-30 NOTE — Telephone Encounter (Signed)
Pt is an 87yof rewuesting eliquis 2.5 wt 54.9, scr 0.78 (11/07/17), lov w/ croitoru (02/01/18) dx afib

## 2018-07-31 ENCOUNTER — Other Ambulatory Visit: Payer: Self-pay | Admitting: Cardiovascular Disease

## 2018-07-31 MED ORDER — APIXABAN 2.5 MG PO TABS: 2.5000 mg | ORAL_TABLET | Freq: Two times a day (BID) | ORAL | 0 refills | Status: DC

## 2018-07-31 NOTE — Telephone Encounter (Signed)
Rx sent to requested pharmacy

## 2018-07-31 NOTE — Telephone Encounter (Signed)
 *  STAT* If patient is at the pharmacy, call can be transferred to refill team.   1. Which medications need to be refilled? (please list name of each medication and dose if known) apixaban (ELIQUIS) 2.5 MG TABS tablet  2. Which pharmacy/location (including street and city if local pharmacy) is medication to be sent to? [facility] [address] [place]  3. Do they need a 30 day or 90 day supply? [place]

## 2018-08-02 ENCOUNTER — Ambulatory Visit (INDEPENDENT_AMBULATORY_CARE_PROVIDER_SITE_OTHER): Payer: Medicare Other | Admitting: *Deleted

## 2018-08-02 DIAGNOSIS — I495 Sick sinus syndrome: Secondary | ICD-10-CM

## 2018-08-02 DIAGNOSIS — I4891 Unspecified atrial fibrillation: Secondary | ICD-10-CM | POA: Diagnosis not present

## 2018-08-02 LAB — CUP PACEART REMOTE DEVICE CHECK
Battery Impedance: 475 Ohm
Battery Remaining Longevity: 106 mo
Battery Voltage: 2.79 V
Brady Statistic AP VP Percent: 0 %
Brady Statistic AP VS Percent: 59 %
Brady Statistic AS VP Percent: 0 %
Brady Statistic AS VS Percent: 41 %
Date Time Interrogation Session: 20200709150103
Implantable Lead Implant Date: 20130611
Implantable Lead Implant Date: 20130611
Implantable Lead Location: 753859
Implantable Lead Location: 753860
Implantable Lead Model: 5076
Implantable Lead Model: 5076
Implantable Pulse Generator Implant Date: 20130611
Lead Channel Impedance Value: 455 Ohm
Lead Channel Impedance Value: 607 Ohm
Lead Channel Pacing Threshold Amplitude: 0.625 V
Lead Channel Pacing Threshold Amplitude: 0.75 V
Lead Channel Pacing Threshold Pulse Width: 0.4 ms
Lead Channel Pacing Threshold Pulse Width: 0.4 ms
Lead Channel Sensing Intrinsic Amplitude: 1.4 mV
Lead Channel Sensing Intrinsic Amplitude: 11.2 mV
Lead Channel Setting Pacing Amplitude: 1.5 V
Lead Channel Setting Pacing Amplitude: 2 V
Lead Channel Setting Pacing Pulse Width: 0.4 ms
Lead Channel Setting Sensing Sensitivity: 5.6 mV

## 2018-08-08 NOTE — Progress Notes (Signed)
Remote pacemaker transmission.   

## 2018-08-13 NOTE — Progress Notes (Signed)
Abigail Taylor was seen today in the movement disorders clinic for neurologic consultation at the request of [name], MD.  The consultation is for the evaluation of PD.  The records that were made available to me were reviewed.  Pt previously has seen Dr. Merlene Laughter.  Unfortunately, his records are not present.  Reviewed PCP, cardiology and PT notes.  The first symptom(s) the patient noticed was R hand and foot tremor and this was 2 years.  She is currently on carbidopa/levodopa 25/100 three per day, at 7:30am/12:30pm/bedtime.  She has some trouble getting going in the AM.    04/18/17 update: Patient is seen today in follow-up.  Patient is now on carbidopa/levodopa 25/100 at 7 AM/11 AM/4 PM and we added carbidopa/levodopa 50/200 at bedtime.  This helped her ability to get up in the AM.  Records were reviewed since our last visit.  She did attend physical therapy at [facility].  she also attended ST and thinks that it is really helpful.  She saw her cardiologist on 03/20/2017 and those records are reviewed.  Orthostatic hypotension was addressed, but she is not on any medication for this.  She is having no dizziness. No falls.  No hallucinations.   09/06/17 update: Patient is seen today in follow-up for Parkinson's disease.  She is on carbidopa/levodopa 25/100, 1 tablet 3 times per day and carbidopa/levodopa 50/200 at bedtime.  Pt denies falls.  Pt denies lightheadedness, near syncope.  No hallucinations.  Mood has been good.  She isn't exercising but "I should."  She has decreased appetite.  Weight has been stable.  "I don't drink enough liquids."  She is drinking 1 boost per day.  Voice is a bit weaker.    03/13/18 update: Patient seen today in follow-up for Parkinson's disease.  Numerous records are reviewed and took 35 min to review.  Patients levodopa was increased last visit so patient is now on carbidopa/levodopa 25/100, 2 tablets in the morning, 1 in the afternoon and 1 in the evening, in  addition to carbidopa/levodopa 50/200 at bedtime.  She has had 3 falls since last visit.  With one she tripped over the dog in the middle of the night.  With one she tripped over the curb.  She was in the emergency room in October with a fall (which was the one where she tripped over the curb).  CT head was unremarkable.  No lightheadedness or near syncope.  She does feel she is overall slowing down.  She can no longer write.  Voice is getting softer.  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  No further falls out of the bed.  It is noted that she was in the hospital in September with A. fib with rapid ventricular response.  She was placed on amiodarone.  Recently, she saw cardiology who is trying to decrease the dose of amiodarone.  She doesn't think that it has made her more shaky but "I don't like the medication."   She has attended therapy since our last visit.  Those records are reviewed.  She was in the hospital in October with constipation and possible melena.  GI work-up was unremarkable.  Was put on linzess for constipation but was very expensive for her, although it did help.  She was in the emergency room in December with low back pain.  08/14/2018: Patient seen today in follow-up for Parkinson's disease.  Last visit, I increased her carbidopa/levodopa 25/100, so  that she was taking 2 tablets in the morning, 2 in the afternoon, 1 in the evening and continued her carbidopa/levodopa 50/200 at bedtime.  She reports that she is nauseated with each dosage, except the bedtime dosage.  Medical records have been reviewed since last visit.  She was in the emergency room in June for constipation.  PREVIOUS MEDICATIONS: none to date  ALLERGIES:  No Known Allergies  CURRENT MEDICATIONS:  Outpatient Encounter Medications as of 08/14/2018  Medication Sig  . apixaban (ELIQUIS) 2.5 MG TABS tablet Take 1 tablet (2.5 mg total) by mouth 2 (two) times daily.  . carbidopa-levodopa  (SINEMET CR) 50-200 MG tablet TAKE 1 TABLET BY MOUTH EVERYDAY AT BEDTIME  . carbidopa-levodopa (SINEMET IR) 25-100 MG tablet 2 at 7am/2 at 11am/1 at 4pm  . fludrocortisone (FLORINEF) 0.1 MG tablet TAKE 1 TABLET BY MOUTH EVERY DAY  . levothyroxine (SYNTHROID, LEVOTHROID) 75 MCG tablet Take 75 mcg by mouth daily before breakfast.  . LINZESS 145 MCG CAPS capsule Take 145 mcg by mouth daily.  . rosuvastatin (CRESTOR) 10 MG tablet Take 10 mg by mouth at bedtime. Reported on 07/09/2015  . XIIDRA 5 % SOLN Place 1 drop into the left eye 2 (two) times daily.   . [DISCONTINUED] amiodarone (PACERONE) 200 MG tablet Take 1 tablet (200 mg total) by mouth every other day. (Patient not taking: Reported on 08/14/2018)   No facility-administered encounter medications on file as of 08/14/2018.     PAST MEDICAL HISTORY:   Past Medical History:  Diagnosis Date  . Angina at rest, with the tachycardia 07/05/2011  . Aortic insufficiency 07/05/2011  . Atrial fibrillation ([place])   . Brady-tachy syndrome ([place])    Dual chamber Medtronic pacemaker Adapta  . Chronic back pain   . Constipation 12/27/2011  . Gait instability   . Hyperlipidemia   . Hypothyroidism   . Normal cardiac stress test 2013  . OA (osteoarthritis)   . Pacemaker   . Parkinson's disease ([place])   . Tremor     PAST SURGICAL HISTORY:   Past Surgical History:  Procedure Laterality Date  . ABDOMINAL HYSTERECTOMY    . CARDIAC CATHETERIZATION  05/26/1999   normal  . CARDIAC CATHETERIZATION  2001   normal coronary arteries  . cataract    . COLONOSCOPY N/A 11/15/2012   Procedure: COLONOSCOPY;  Surgeon: [name], MD;  [facility];  Service: Endoscopy;  Laterality: N/A;  1030  . ESOPHAGOGASTRODUODENOSCOPY (EGD) WITH PROPOFOL N/A 11/06/2017   Procedure: ESOPHAGOGASTRODUODENOSCOPY (EGD) WITH PROPOFOL;  Surgeon: [name], MD;  [facility];  Service: Endoscopy;  Laterality: N/A;  . HIP ARTHROPLASTY Left 06/24/2016    Procedure: ARTHROPLASTY BIPOLAR HIP (HEMIARTHROPLASTY);  Surgeon: Altamese [place]opolis, MD;  Location: [place];  Service: Orthopedics;  Laterality: Left;  . INSERT / REPLACE / REMOVE PACEMAKER    . NM MYOVIEW LTD  07/19/2011   normal  . PERMANENT PACEMAKER INSERTION  07/05/2011   Medtronic Adapta dual chamber  . PERMANENT PACEMAKER INSERTION N/A 07/05/2011   Procedure: PERMANENT PACEMAKER INSERTION;  Surgeon: [name], MD;  Location: [place] CATH LAB;  Service: Cardiovascular;  Laterality: N/A;    SOCIAL HISTORY:   Social History   Socioeconomic History  . Marital status: Married    Spouse name: Not on file  . Number of children: 3  . Years of education: Not on file  . Highest education level: Some college, no degree  Occupational History  . Occupation: retired  Comment: Air cabin crew [employer]  Social Needs  . Financial resource strain: Not on file  . Food insecurity    Worry: Not on file    Inability: Not on file  . Transportation needs    Medical: Not on file    Non-medical: Not on file  Tobacco Use  . Smoking status: Never Smoker  . Smokeless tobacco: Never Used  Substance and Sexual Activity  . Alcohol use: No    Alcohol/week: 0.0 standard drinks  . Drug use: No  . Sexual activity: Not on file  Lifestyle  . Physical activity    Days per week: Not on file    Minutes per session: Not on file  . Stress: Not on file  Relationships  . Social Herbalist on phone: Not on file    Gets together: Not on file    Attends religious service: Not on file    Active member of club or organization: Not on file    Attends meetings of clubs or organizations: Not on file    Relationship status: Not on file  . Intimate partner violence    Fear of current or ex partner: Not on file    Emotionally abused: Not on file    Physically abused: Not on file    Forced sexual activity: Not on file  Other Topics Concern  . Not on file  Social History Narrative  . Not on file     FAMILY HISTORY:   Family Status  Relation Name Status  . Mother  Deceased       MI, colon cancer at age 28  . Father  Deceased       Kidney cancer  . Sister 2 Deceased       etoh abuse, one had kidney cancer  . Brother 1 Alive       good health  . Son [employer]       good health  . Daughter 1 Alive       good health  . Son  Deceased    ROS: Review of Systems  Constitutional: Negative.   HENT: Negative.   Eyes: Negative.   Respiratory: Negative.   Cardiovascular: Negative.   Gastrointestinal: Positive for nausea.  Skin: Negative.      PHYSICAL EXAMINATION:    VITALS:   Vitals:   08/14/18 1041  BP: 96/64  Pulse: 71  Temp: 97.9 F (36.6 C)  SpO2: 96%  Weight: 120 lb (54.4 kg)  Height: 5\' 6"  (1.676 m)   Wt Readings from Last 3 Encounters:  08/14/18 120 lb (54.4 kg)  06/26/18 118 lb (53.5 kg)  03/13/18 121 lb (54.9 kg)    GEN:  The patient appears stated age and is in NAD. HEENT:  Normocephalic, atraumatic.  The mucous membranes are moist. The superficial temporal arteries are without ropiness or tenderness. CV:  RRR Lungs:  CTAB Neck/HEME:  There are no carotid bruits bilaterally.  Neurological examination:  Orientation: The patient is alert and oriented x3. Cranial nerves: There is good facial symmetry.  There is facial hypomimia.  The speech is fluent and clear. Soft palate rises symmetrically and there is no tongue deviation. Hearing is intact to conversational tone. Sensation: Sensation is intact to light touch throughout Motor: Strength is 5/5 in the bilateral upper and lower extremities.   Shoulder shrug is equal and symmetric.  There is no pronator drift.  Movement examination: Tone: there is mild increased tone in the LUE Abnormal  movements: There is no tremor today Coordination:  There is decremation, with any form of RAMS, including alternating supination and pronation of the forearm, hand opening and closing, finger taps, heel taps and toe taps, L  more than right Gait and Station: The patient has minimal difficulty arising out of a deep-seated chair without the use of the hands. The patient's stride length is slightly decreased.  She has decreased arm swing bilaterally  ASSESSMENT/PLAN:  1.  Idiopathic Parkinson's disease.  The patient has tremor, bradykinesia, rigidity and mild postural instability.  This was dx in approx 2016  -We discussed the diagnosis as well as pathophysiology of the disease.  We discussed treatment options as well as prognostic indicators.  Patient education was provided.  -Stop carbidopa/levodopa 25/100 immediate release due to nausea (although admittedly has less nausea if she takes with the carbohydrate)  -Start carbidopa/levodopa 25/100 CR and continue to take 2 tablets at 7 AM, 2 tablets at 11 AM, 1 tablet at 4 PM.  She will let me know if nausea does not get better.  -Continue carbidopa/levodopa 50/200 at bedtime.  -Talked about the importance of exercise and doing her physical and speech therapy exercises that she has at home.  She declined referral to physical therapy, despite the fact she is having more pain in the hip (hip she previously had surgery on).  2.  RBD  -Wants no medication for this.  Understands better and safety.  3.  Orthostatic hypotension  -on florinef and doing well clinically.  She is asymptomatic but BP was quite low today.    -encouraged hydration, and admits that she is not eating or drinking well.  She is on boost supplements.  4.  Sialorrhea  -Talked about Botox again today, but she really does not want this.  Will let me know if she changes her mind.  5.  Dry eye  -Seeing ophthalmology, but the drops are too expensive for her to afford.  She is seeing Dr. Valetta Close.  6.constipation  -rancho recipe given  7. Paroxysmal A. Fib.  -She was in the hospital in September, 2019 with rapid ventricular response.  She was started on amiodarone.  Cardiology is trying to decrease that  dosage now.  She and I discussed that amiodarone certainly can increase tremor.  Rarely, it can make parkinsonism worse.  8.  Constipation   -Linzess has helped.  Discussed the fact that increasing her water intake would help as well.  Has been in the emergency room since last visit because of this.  9.  F/u 6 months.    Cc:  [name], MD

## 2018-08-14 ENCOUNTER — Ambulatory Visit (INDEPENDENT_AMBULATORY_CARE_PROVIDER_SITE_OTHER): Payer: Medicare Other | Admitting: Neurology

## 2018-08-14 ENCOUNTER — Encounter: Payer: Self-pay | Admitting: Neurology

## 2018-08-14 ENCOUNTER — Other Ambulatory Visit: Payer: Self-pay

## 2018-08-14 VITALS — BP 96/64 | HR 71 | Temp 97.9°F | Ht 66.0 in | Wt 120.0 lb

## 2018-08-14 DIAGNOSIS — K5901 Slow transit constipation: Secondary | ICD-10-CM | POA: Diagnosis not present

## 2018-08-14 DIAGNOSIS — K117 Disturbances of salivary secretion: Secondary | ICD-10-CM | POA: Diagnosis not present

## 2018-08-14 DIAGNOSIS — G20 Parkinson's disease: Secondary | ICD-10-CM

## 2018-08-14 DIAGNOSIS — G903 Multi-system degeneration of the autonomic nervous system: Secondary | ICD-10-CM | POA: Diagnosis not present

## 2018-08-14 MED ORDER — CARBIDOPA-LEVODOPA ER 25-100 MG PO TBCR: EXTENDED_RELEASE_TABLET | ORAL | 1 refills | Status: DC

## 2018-08-14 NOTE — Patient Instructions (Addendum)
Stop carbidopa/levodopa 25/100 IR ( this is your YELLOW daytime tablet)  Start carbidopa/levodopa 25/100 CR, and take these at the same time you were taking your yellow pills - 2 tablets in the AM, 2 in the middle of the day and 1 in the evening.  You can take this with crackers, toast, or any carbohydrate  Continue carbidopa/levodopa 50/200 at bedtime

## 2018-08-16 ENCOUNTER — Other Ambulatory Visit: Payer: Self-pay

## 2018-08-16 ENCOUNTER — Emergency Department (HOSPITAL_COMMUNITY)
Admission: EM | Admit: 2018-08-16 | Discharge: 2018-08-16 | Disposition: A | Discharge status: Home / Self Care | Payer: Medicare Other | Attending: Emergency Medicine | Admitting: Emergency Medicine

## 2018-08-16 ENCOUNTER — Emergency Department (HOSPITAL_COMMUNITY): Payer: Medicare Other

## 2018-08-16 ENCOUNTER — Encounter (HOSPITAL_COMMUNITY): Payer: Self-pay

## 2018-08-16 DIAGNOSIS — Y92008 Other place in unspecified non-institutional (private) residence as the place of occurrence of the external cause: Secondary | ICD-10-CM | POA: Diagnosis not present

## 2018-08-16 DIAGNOSIS — Y9389 Activity, other specified: Secondary | ICD-10-CM | POA: Insufficient documentation

## 2018-08-16 DIAGNOSIS — I10 Essential (primary) hypertension: Secondary | ICD-10-CM | POA: Insufficient documentation

## 2018-08-16 DIAGNOSIS — M5489 Other dorsalgia: Secondary | ICD-10-CM | POA: Diagnosis present

## 2018-08-16 DIAGNOSIS — S32010A Wedge compression fracture of first lumbar vertebra, initial encounter for closed fracture: Secondary | ICD-10-CM | POA: Insufficient documentation

## 2018-08-16 DIAGNOSIS — Z950 Presence of cardiac pacemaker: Secondary | ICD-10-CM | POA: Insufficient documentation

## 2018-08-16 DIAGNOSIS — X509XXA Other and unspecified overexertion or strenuous movements or postures, initial encounter: Secondary | ICD-10-CM | POA: Diagnosis not present

## 2018-08-16 DIAGNOSIS — G20 Parkinson's disease: Secondary | ICD-10-CM | POA: Insufficient documentation

## 2018-08-16 DIAGNOSIS — E039 Hypothyroidism, unspecified: Secondary | ICD-10-CM | POA: Diagnosis not present

## 2018-08-16 DIAGNOSIS — Z79899 Other long term (current) drug therapy: Secondary | ICD-10-CM | POA: Insufficient documentation

## 2018-08-16 DIAGNOSIS — S32000A Wedge compression fracture of unspecified lumbar vertebra, initial encounter for closed fracture: Secondary | ICD-10-CM

## 2018-08-16 DIAGNOSIS — Y999 Unspecified external cause status: Secondary | ICD-10-CM | POA: Diagnosis not present

## 2018-08-16 DIAGNOSIS — Z7901 Long term (current) use of anticoagulants: Secondary | ICD-10-CM | POA: Insufficient documentation

## 2018-08-16 MED ORDER — OXYCODONE HCL 5 MG PO TABS
2.5000 mg | ORAL_TABLET | Freq: Once | ORAL | Status: AC
  Administered 2018-08-16: 2.5 mg via ORAL
  Filled 2018-08-16: qty 1

## 2018-08-16 MED ORDER — OXYCODONE HCL 5 MG PO TABS: 2.5000 mg | ORAL_TABLET | Freq: Three times a day (TID) | ORAL | 0 refills | Status: DC | PRN

## 2018-08-16 MED ORDER — ONDANSETRON 4 MG PO TBDP
4.0000 mg | ORAL_TABLET | Freq: Once | ORAL | Status: AC
  Administered 2018-08-16: 4 mg via ORAL
  Filled 2018-08-16: qty 1

## 2018-08-16 NOTE — ED Provider Notes (Signed)
[facility] EMERGENCY DEPARTMENT Provider Note   CSN: [id number] Arrival date & time: 08/16/18  4098    History   Chief Complaint Chief Complaint  Patient presents with  . Back Pain    HPI Abigail [facility] is a 85 y.o. female.     The history is provided by the patient, the spouse and medical records. No language interpreter was used.  Back Pain  Abigail [facility] is a 85 y.o. female who presents to the Emergency Department complaining of back pain. She presents to the emergency department accompanied by her husband for evaluation of acute onset low back pain that began about 4:30 PM yesterday afternoon. She bent over to pick up her small dog and when she was coming back up she felt a pop in her lumbar back. The pain was severe and radiates slightly to the left side. Pain is constant in nature and worse with movement. No prior similar symptoms. She denies any fevers, chest pain, shortness of breath, abdominal pain, nausea, vomiting, dysuria, numbness, weakness. She has a history of atrial fibrillation, SSS status post pacemaker placement, Parkinson's disease. She is anticoagulated on eliquis, BID. She took Tylenol at home this morning with no significant improvement in her symptoms. Past Medical History:  Diagnosis Date  . Angina at rest, with the tachycardia 07/05/2011  . Aortic insufficiency 07/05/2011  . Atrial fibrillation ([place])   . Brady-tachy syndrome ([place])    Dual chamber Medtronic pacemaker Adapta  . Chronic back pain   . Constipation 12/27/2011  . Gait instability   . Hyperlipidemia   . Hypothyroidism   . Normal cardiac stress test 2013  . OA (osteoarthritis)   . Pacemaker   . Parkinson's disease ([place])   . Tremor     Patient Active Problem List   Diagnosis Date Noted  . Anticoagulated   . Epigastric pain   . Diverticulosis   . Melena 11/04/2017  . Atrial fibrillation with RVR ([place]) 09/29/2017  . RBD (REM behavioral disorder) 01/10/2017  .  Neurogenic orthostatic hypotension ([place]) 01/10/2017  . Tachy-brady syndrome ([place]) 06/23/2016  . Closed subcapital fracture of left femur ([place]) 06/23/2016  . Orthostatic hypotension due to Parkinson's disease ([place]) 06/23/2016  . PD (Parkinson's disease) ([place]) 04/21/2016  . Hypokalemia 04/21/2016  . Normal coronary arteries 2001 05/08/2014  . Pacemaker 08/10/2012  . Aortic insufficiency 08/10/2012  . Constipation 12/27/2011  . Family hx of colon cancer 12/27/2011  . HTN (hypertension) 07/06/2011  . Hypothyroidism 07/05/2011  . Paroxysmal atrial fibrillation ([place]) 07/05/2011  . Sinus pause, 7 seconds post conversion, S/P MDT pacemaker 07/05/11 07/05/2011  . Dyslipidemia 07/05/2011  . Angina at rest, with rapid AF 07/05/2011    Past Surgical History:  Procedure Laterality Date  . ABDOMINAL HYSTERECTOMY    . CARDIAC CATHETERIZATION  05/26/1999   normal  . CARDIAC CATHETERIZATION  2001   normal coronary arteries  . cataract    . COLONOSCOPY N/A 11/15/2012   Procedure: COLONOSCOPY;  Surgeon: [name], MD;  [facility];  Service: Endoscopy;  Laterality: N/A;  1030  . ESOPHAGOGASTRODUODENOSCOPY (EGD) WITH PROPOFOL N/A 11/06/2017   Procedure: ESOPHAGOGASTRODUODENOSCOPY (EGD) WITH PROPOFOL;  Surgeon: [name], MD;  [facility];  Service: Endoscopy;  Laterality: N/A;  . HIP ARTHROPLASTY Left 06/24/2016   Procedure: ARTHROPLASTY BIPOLAR HIP (HEMIARTHROPLASTY);  Surgeon: Altamese Laurel, MD;  Location: [facility];  Service: Orthopedics;  Laterality: Left;  . INSERT / REPLACE / REMOVE PACEMAKER    .  NM MYOVIEW LTD  07/19/2011   normal  . PERMANENT PACEMAKER INSERTION  07/05/2011   Medtronic Adapta dual chamber  . PERMANENT PACEMAKER INSERTION N/A 07/05/2011   Procedure: PERMANENT PACEMAKER INSERTION;  Surgeon: [name], MD;  Location: [facility];  Service: Cardiovascular;  Laterality: N/A;     OB History   No obstetric history on file.      Home  Medications    Prior to Admission medications   Medication Sig Start Date End Date Taking? Authorizing Provider  acetaminophen (TYLENOL) 500 MG tablet Take 1,000 mg by mouth every 6 (six) hours as needed for mild pain.   Yes [provider]  apixaban (ELIQUIS) 2.5 MG TABS tablet Take 1 tablet (2.5 mg total) by mouth 2 (two) times daily. 07/31/18  Yes Croitoru, Mihai, MD  carbidopa-levodopa (SINEMET CR) 50-200 MG tablet TAKE 1 TABLET BY MOUTH EVERYDAY AT BEDTIME 05/17/18  Yes Tat, Rebecca S, DO  Carbidopa-Levodopa ER (SINEMET CR) 25-100 MG tablet controlled release 2 at 7am/2 at 11am/1 at 4pm 08/14/18  Yes Tat, Eustace Quail, DO  fludrocortisone (FLORINEF) 0.1 MG tablet TAKE 1 TABLET BY MOUTH EVERY DAY 04/23/18  Yes Croitoru, Mihai, MD  levothyroxine (SYNTHROID, LEVOTHROID) 75 MCG tablet Take 75 mcg by mouth daily before breakfast.   Yes [provider]  LINZESS 145 MCG CAPS capsule Take 145 mcg by mouth daily. 12/14/17  Yes [provider]  rosuvastatin (CRESTOR) 10 MG tablet Take 10 mg by mouth at bedtime. Reported on 07/09/2015   Yes [provider]  XIIDRA 5 % SOLN Place 1 drop into the left eye 2 (two) times daily.  10/04/16  Yes [provider]  oxyCODONE (OXY IR/ROXICODONE) 5 MG immediate release tablet Take 0.5 tablets (2.5 mg total) by mouth every 8 (eight) hours as needed for severe pain. 08/16/18   Quintella Reichert, MD    Family History Family History  Problem Relation Age of Onset  . Colon cancer Mother 62  . Heart attack Mother   . Cancer Father   . Kidney cancer Sister   . Healthy Brother   . Healthy Son   . Healthy Daughter   . Suicidality Son     Social History Social History   Tobacco Use  . Smoking status: Never Smoker  . Smokeless tobacco: Never Used  Substance Use Topics  . Alcohol use: No    Alcohol/week: 0.0 standard drinks  . Drug use: No     Allergies   Patient has no known allergies.   Review of Systems Review of  Systems  Musculoskeletal: Positive for back pain.  All other systems reviewed and are negative.    Physical Exam Updated Vital Signs BP (!) 174/79   Pulse 69   Temp 97.9 F (36.6 C) (Oral)   Resp 19   Ht 5\' 6"  (1.676 m)   Wt 54.4 kg   SpO2 93%   BMI 19.37 kg/m   Physical Exam Vitals signs and nursing note reviewed.  Constitutional:      Appearance: She is well-developed.  HENT:     Head: Normocephalic and atraumatic.  Cardiovascular:     Rate and Rhythm: Normal rate and regular rhythm.     Heart sounds: No murmur.  Pulmonary:     Effort: Pulmonary effort is normal. No respiratory distress.     Breath sounds: Normal breath sounds.  Abdominal:     Palpations: Abdomen is soft.     Tenderness: There is no abdominal tenderness.  There is no guarding or rebound.  Musculoskeletal:        General: No tenderness.     Comments: There is scoliosis to the thoracic spine. There is no discrete midline lumbar tenderness to palpation. No CVA tenderness. 2+ DP pulses bilaterally. Pain to back is reproducible on movement.  Skin:    General: Skin is warm and dry.  Neurological:     Mental Status: She is alert.     Comments: Five out of five strength in all four extremities with sensation to light touch intact in all four extremities. Disoriented to time. Oriented to place, person in recent events.  Psychiatric:        Mood and Affect: Mood normal.        Behavior: Behavior normal.      ED Treatments / Results  Labs (all labs ordered are listed, but only abnormal results are displayed) Labs Reviewed - No data to display  EKG None  Radiology Ct Lumbar Spine Wo Contrast  Result Date: 08/16/2018 CLINICAL DATA:  Severe lumbar back pain after bending injury yesterday. EXAM: CT LUMBAR SPINE WITHOUT CONTRAST TECHNIQUE: Multidetector CT imaging of the lumbar spine was performed without intravenous contrast administration. Multiplanar CT image reconstructions were also generated.  COMPARISON:  Radiography 01/07/2018.  CT 03/04/2014. FINDINGS: Segmentation: 5 lumbar type vertebral bodies. Alignment: Thoracolumbar curvature convex to the right and lower lumbar curvature convex to the left. Straightening of the normal lumbar lordosis. Vertebrae: Acute superior endplate fracture at L1 with loss of height of 10%. No retropulsed bone. Paraspinal and other soft tissues: Negative Disc levels: Chronic disc degeneration with disc space narrowing and facet osteoarthritis throughout the lumbar region as seen previously. Moderate multifactorial stenosis at L2-3 and L3-4. Bilateral lateral recess stenosis at L4-5. Left foraminal stenosis at L5-S1. Chronic sacroiliac osteoarthritis. IMPRESSION: Acute L1 fracture at the superior endplate with loss of height of 10%. No retropulsed bone. Chronic curvature in degenerative changes throughout the lumbar region, similar to the study of 2016. Electronically Signed   By: Nelson Chimes M.D.   On: 08/16/2018 12:40    Procedures Procedures (including critical care time)  Medications Ordered in ED Medications  oxyCODONE (Oxy IR/ROXICODONE) immediate release tablet 2.5 mg (2.5 mg Oral Given 08/16/18 1046)  ondansetron (ZOFRAN-ODT) disintegrating tablet 4 mg (4 mg Oral Given 08/16/18 1046)     Initial Impression / Assessment and Plan / ED Course  I have reviewed the triage vital signs and the nursing notes.  Pertinent labs & imaging results that were available during my care of the patient were reviewed by me and considered in my medical decision making (see chart for details).        Patient here for evaluation of abrupt onset lumbar back pain after feeling a pop. She is neurologically intact on evaluation. Imaging is significant for lumbar compression fracture. Plan to place in the TLSO for comfort. Her pain is well-controlled after 2.5 mg of oxycodone. Discussed with patient and husband on home care for compression fracture.  Discussed outpatient  Neurosurgery follow up and return precautions.    Final Clinical Impressions(s) / ED Diagnoses   Final diagnoses:  Lumbar compression fracture, closed, initial encounter [facility])    ED Discharge Orders         Ordered    oxyCODONE (OXY IR/ROXICODONE) 5 MG immediate release tablet  Every 8 hours PRN     08/16/18 1336           Quintella Reichert, MD 08/16/18  1603  

## 2018-08-16 NOTE — ED Triage Notes (Signed)
Pt from home , c/o low/mid back pain that began around 4pm yesterday; pt states she was lifting her dog into the car when she heard a "pop", pain began after that

## 2018-08-16 NOTE — Discharge Instructions (Addendum)
You have a fracture of your first lumbar vertebra.  Please follow up with your family doctor and Neurosurgery.

## 2018-08-16 NOTE — Progress Notes (Signed)
Orthopedic Tech Progress Note Patient Details:  Abigail [facility] May 12, 1931 [id number] Called in order to [facility] for TLSO Patient ID: [name], female   DOB: [birth year], 85 y.o.   MRN: [id number]   Janit Pagan 08/16/2018, 1:26 PM

## 2018-09-02 ENCOUNTER — Other Ambulatory Visit: Payer: Self-pay | Admitting: Neurology

## 2018-09-03 NOTE — Telephone Encounter (Signed)
Requested Prescriptions   Pending Prescriptions Disp Refills  . carbidopa-levodopa (SINEMET CR) 50-200 MG tablet [Pharmacy Med Name: CARBIDOPA-LEVO ER 50-200 TAB] 90 tablet 0    Sig: TAKE 1 TABLET BY MOUTH EVERYDAY AT BEDTIME   Rx last filled: 05/17/18 #90 0 REFILLS  Pt last seen: 08/14/18   Follow up appt scheduled: 02/12/2019

## 2018-09-05 DIAGNOSIS — R0781 Pleurodynia: Secondary | ICD-10-CM | POA: Diagnosis not present

## 2018-09-05 DIAGNOSIS — M549 Dorsalgia, unspecified: Secondary | ICD-10-CM | POA: Diagnosis not present

## 2018-09-05 DIAGNOSIS — S32018G Other fracture of first lumbar vertebra, subsequent encounter for fracture with delayed healing: Secondary | ICD-10-CM | POA: Diagnosis not present

## 2018-09-24 DIAGNOSIS — I10 Essential (primary) hypertension: Secondary | ICD-10-CM | POA: Diagnosis not present

## 2018-09-24 DIAGNOSIS — L8992 Pressure ulcer of unspecified site, stage 2: Secondary | ICD-10-CM | POA: Diagnosis not present

## 2018-09-24 DIAGNOSIS — I4891 Unspecified atrial fibrillation: Secondary | ICD-10-CM | POA: Diagnosis not present

## 2018-10-03 DIAGNOSIS — S32018G Other fracture of first lumbar vertebra, subsequent encounter for fracture with delayed healing: Secondary | ICD-10-CM | POA: Diagnosis not present

## 2018-10-04 ENCOUNTER — Other Ambulatory Visit: Payer: Self-pay

## 2018-10-04 ENCOUNTER — Encounter (HOSPITAL_COMMUNITY): Payer: Self-pay | Admitting: Emergency Medicine

## 2018-10-04 ENCOUNTER — Inpatient Hospital Stay (HOSPITAL_COMMUNITY): Payer: Medicare Other

## 2018-10-04 ENCOUNTER — Inpatient Hospital Stay (HOSPITAL_COMMUNITY)
Admission: EM | Admit: 2018-10-04 | Discharge: 2018-10-07 | DRG: 310 | Disposition: A | Discharge status: Home Health | Payer: Medicare Other | Attending: Internal Medicine | Admitting: Internal Medicine

## 2018-10-04 ENCOUNTER — Emergency Department (HOSPITAL_COMMUNITY): Payer: Medicare Other

## 2018-10-04 DIAGNOSIS — Z20828 Contact with and (suspected) exposure to other viral communicable diseases: Secondary | ICD-10-CM | POA: Diagnosis present

## 2018-10-04 DIAGNOSIS — I4819 Other persistent atrial fibrillation: Secondary | ICD-10-CM | POA: Diagnosis present

## 2018-10-04 DIAGNOSIS — I951 Orthostatic hypotension: Secondary | ICD-10-CM | POA: Diagnosis present

## 2018-10-04 DIAGNOSIS — G903 Multi-system degeneration of the autonomic nervous system: Secondary | ICD-10-CM | POA: Diagnosis present

## 2018-10-04 DIAGNOSIS — Z23 Encounter for immunization: Secondary | ICD-10-CM | POA: Diagnosis not present

## 2018-10-04 DIAGNOSIS — I959 Hypotension, unspecified: Secondary | ICD-10-CM | POA: Diagnosis not present

## 2018-10-04 DIAGNOSIS — I340 Nonrheumatic mitral (valve) insufficiency: Secondary | ICD-10-CM

## 2018-10-04 DIAGNOSIS — Z79899 Other long term (current) drug therapy: Secondary | ICD-10-CM | POA: Diagnosis not present

## 2018-10-04 DIAGNOSIS — L98429 Non-pressure chronic ulcer of back with unspecified severity: Secondary | ICD-10-CM | POA: Diagnosis present

## 2018-10-04 DIAGNOSIS — Z79890 Hormone replacement therapy (postmenopausal): Secondary | ICD-10-CM | POA: Diagnosis not present

## 2018-10-04 DIAGNOSIS — R55 Syncope and collapse: Secondary | ICD-10-CM | POA: Diagnosis not present

## 2018-10-04 DIAGNOSIS — I10 Essential (primary) hypertension: Secondary | ICD-10-CM | POA: Diagnosis present

## 2018-10-04 DIAGNOSIS — I4891 Unspecified atrial fibrillation: Secondary | ICD-10-CM | POA: Diagnosis present

## 2018-10-04 DIAGNOSIS — Z96642 Presence of left artificial hip joint: Secondary | ICD-10-CM | POA: Diagnosis present

## 2018-10-04 DIAGNOSIS — Z90710 Acquired absence of both cervix and uterus: Secondary | ICD-10-CM | POA: Diagnosis not present

## 2018-10-04 DIAGNOSIS — K5900 Constipation, unspecified: Secondary | ICD-10-CM | POA: Diagnosis present

## 2018-10-04 DIAGNOSIS — E876 Hypokalemia: Secondary | ICD-10-CM | POA: Diagnosis present

## 2018-10-04 DIAGNOSIS — Z950 Presence of cardiac pacemaker: Secondary | ICD-10-CM | POA: Diagnosis not present

## 2018-10-04 DIAGNOSIS — E039 Hypothyroidism, unspecified: Secondary | ICD-10-CM | POA: Diagnosis present

## 2018-10-04 DIAGNOSIS — W19XXXA Unspecified fall, initial encounter: Secondary | ICD-10-CM | POA: Diagnosis not present

## 2018-10-04 DIAGNOSIS — I495 Sick sinus syndrome: Secondary | ICD-10-CM | POA: Diagnosis present

## 2018-10-04 DIAGNOSIS — S0990XA Unspecified injury of head, initial encounter: Secondary | ICD-10-CM | POA: Diagnosis not present

## 2018-10-04 DIAGNOSIS — Y92013 Bedroom of single-family (private) house as the place of occurrence of the external cause: Secondary | ICD-10-CM

## 2018-10-04 DIAGNOSIS — I480 Paroxysmal atrial fibrillation: Principal | ICD-10-CM | POA: Diagnosis present

## 2018-10-04 DIAGNOSIS — E785 Hyperlipidemia, unspecified: Secondary | ICD-10-CM | POA: Diagnosis present

## 2018-10-04 DIAGNOSIS — S199XXA Unspecified injury of neck, initial encounter: Secondary | ICD-10-CM | POA: Diagnosis not present

## 2018-10-04 DIAGNOSIS — Z7901 Long term (current) use of anticoagulants: Secondary | ICD-10-CM | POA: Diagnosis not present

## 2018-10-04 DIAGNOSIS — W06XXXA Fall from bed, initial encounter: Secondary | ICD-10-CM | POA: Diagnosis present

## 2018-10-04 DIAGNOSIS — I361 Nonrheumatic tricuspid (valve) insufficiency: Secondary | ICD-10-CM

## 2018-10-04 DIAGNOSIS — G20 Parkinson's disease: Secondary | ICD-10-CM | POA: Diagnosis present

## 2018-10-04 DIAGNOSIS — L8990 Pressure ulcer of unspecified site, unspecified stage: Secondary | ICD-10-CM | POA: Insufficient documentation

## 2018-10-04 DIAGNOSIS — Z03818 Encounter for observation for suspected exposure to other biological agents ruled out: Secondary | ICD-10-CM | POA: Diagnosis not present

## 2018-10-04 LAB — CBC WITH DIFFERENTIAL/PLATELET
Abs Immature Granulocytes: 0.04 10*3/uL (ref 0.00–0.07)
Basophils Absolute: 0 10*3/uL (ref 0.0–0.1)
Basophils Relative: 0 %
Eosinophils Absolute: 0.1 10*3/uL (ref 0.0–0.5)
Eosinophils Relative: 1 %
HCT: 43.1 % (ref 36.0–46.0)
Hemoglobin: 13.6 g/dL (ref 12.0–15.0)
Immature Granulocytes: 0 %
Lymphocytes Relative: 15 %
Lymphs Abs: 1.6 10*3/uL (ref 0.7–4.0)
MCH: 28.5 pg (ref 26.0–34.0)
MCHC: 31.6 g/dL (ref 30.0–36.0)
MCV: 90.2 fL (ref 80.0–100.0)
Monocytes Absolute: 0.9 10*3/uL (ref 0.1–1.0)
Monocytes Relative: 8 %
Neutro Abs: 7.9 10*3/uL — ABNORMAL HIGH (ref 1.7–7.7)
Neutrophils Relative %: 76 %
Platelets: 230 10*3/uL (ref 150–400)
RBC: 4.78 MIL/uL (ref 3.87–5.11)
RDW: 13.2 % (ref 11.5–15.5)
WBC: 10.5 10*3/uL (ref 4.0–10.5)
nRBC: 0 % (ref 0.0–0.2)

## 2018-10-04 LAB — URINALYSIS, COMPLETE (UACMP) WITH MICROSCOPIC
Bilirubin Urine: NEGATIVE
Glucose, UA: NEGATIVE mg/dL
Hgb urine dipstick: NEGATIVE
Ketones, ur: 5 mg/dL — AB
Leukocytes,Ua: NEGATIVE
Nitrite: NEGATIVE
Protein, ur: NEGATIVE mg/dL
Specific Gravity, Urine: 1.024 (ref 1.005–1.030)
pH: 5 (ref 5.0–8.0)

## 2018-10-04 LAB — BASIC METABOLIC PANEL
Anion gap: 9 (ref 5–15)
BUN: 11 mg/dL (ref 8–23)
CO2: 25 mmol/L (ref 22–32)
Calcium: 8.3 mg/dL — ABNORMAL LOW (ref 8.9–10.3)
Chloride: 107 mmol/L (ref 98–111)
Creatinine, Ser: 0.68 mg/dL (ref 0.44–1.00)
GFR calc Af Amer: >60 mL/min (ref >60)
GFR calc non Af Amer: >60 mL/min (ref >60)
Glucose, Bld: 117 mg/dL — ABNORMAL HIGH (ref 70–99)
Potassium: 2.9 mmol/L — ABNORMAL LOW (ref 3.5–5.1)
Sodium: 141 mmol/L (ref 135–145)

## 2018-10-04 LAB — SARS CORONAVIRUS 2 BY RT PCR (HOSPITAL ORDER, PERFORMED IN ~~LOC~~ HOSPITAL LAB): SARS Coronavirus 2: NEGATIVE

## 2018-10-04 LAB — ECHOCARDIOGRAM COMPLETE
Height: 66 in
Weight: 1890.66 oz

## 2018-10-04 LAB — MRSA PCR SCREENING: MRSA by PCR: NEGATIVE

## 2018-10-04 LAB — MAGNESIUM: Magnesium: 1.9 mg/dL (ref 1.7–2.4)

## 2018-10-04 MED ORDER — LIFITEGRAST 5 % OP SOLN: 1.0000 [drp] | Freq: Two times a day (BID) | OPHTHALMIC | Status: DC

## 2018-10-04 MED ORDER — LINACLOTIDE 145 MCG PO CAPS
145.0000 ug | ORAL_CAPSULE | Freq: Every day | ORAL | Status: DC
  Administered 2018-10-04 – 2018-10-07 (×4): 145 ug via ORAL
  Filled 2018-10-04 (×5): qty 1

## 2018-10-04 MED ORDER — CHLORHEXIDINE GLUCONATE CLOTH 2 % EX PADS
6.0000 | MEDICATED_PAD | Freq: Every day | CUTANEOUS | Status: DC
  Administered 2018-10-04 – 2018-10-07 (×4): 6 via TOPICAL

## 2018-10-04 MED ORDER — APIXABAN 2.5 MG PO TABS
2.5000 mg | ORAL_TABLET | Freq: Two times a day (BID) | ORAL | Status: DC
  Administered 2018-10-04 – 2018-10-07 (×7): 2.5 mg via ORAL
  Filled 2018-10-04 (×9): qty 1

## 2018-10-04 MED ORDER — PNEUMOCOCCAL VAC POLYVALENT 25 MCG/0.5ML IJ INJ: 0.5000 mL | INJECTION | INTRAMUSCULAR | Status: DC

## 2018-10-04 MED ORDER — AMIODARONE HCL IN DEXTROSE 360-4.14 MG/200ML-% IV SOLN
60.0000 mg/h | INTRAVENOUS | Status: AC
  Administered 2018-10-04: 60 mg/h via INTRAVENOUS
  Filled 2018-10-04: qty 200

## 2018-10-04 MED ORDER — ACETAMINOPHEN 500 MG PO TABS: 1000.0000 mg | ORAL_TABLET | Freq: Four times a day (QID) | ORAL | Status: DC | PRN

## 2018-10-04 MED ORDER — AMIODARONE HCL IN DEXTROSE 360-4.14 MG/200ML-% IV SOLN
30.0000 mg/h | INTRAVENOUS | Status: DC
  Administered 2018-10-04 – 2018-10-05 (×3): 30 mg/h via INTRAVENOUS
  Filled 2018-10-04 (×3): qty 200

## 2018-10-04 MED ORDER — INFLUENZA VAC A&B SA ADJ QUAD 0.5 ML IM PRSY
0.5000 mL | PREFILLED_SYRINGE | INTRAMUSCULAR | Status: AC
  Administered 2018-10-05: 0.5 mL via INTRAMUSCULAR
  Filled 2018-10-04: qty 0.5

## 2018-10-04 MED ORDER — POTASSIUM CHLORIDE CRYS ER 20 MEQ PO TBCR
20.0000 meq | EXTENDED_RELEASE_TABLET | Freq: Once | ORAL | Status: AC
  Administered 2018-10-04: 20 meq via ORAL
  Filled 2018-10-04: qty 1

## 2018-10-04 MED ORDER — POTASSIUM CHLORIDE CRYS ER 20 MEQ PO TBCR
40.0000 meq | EXTENDED_RELEASE_TABLET | Freq: Once | ORAL | Status: AC
  Administered 2018-10-04: 40 meq via ORAL
  Filled 2018-10-04: qty 2

## 2018-10-04 MED ORDER — OXYCODONE HCL 5 MG PO TABS: 2.5000 mg | ORAL_TABLET | Freq: Three times a day (TID) | ORAL | Status: DC | PRN

## 2018-10-04 MED ORDER — ROSUVASTATIN CALCIUM 10 MG PO TABS
10.0000 mg | ORAL_TABLET | Freq: Every day | ORAL | Status: DC
  Administered 2018-10-04 – 2018-10-06 (×3): 10 mg via ORAL
  Filled 2018-10-04 (×3): qty 1

## 2018-10-04 MED ORDER — FLUDROCORTISONE ACETATE 0.1 MG PO TABS
100.0000 ug | ORAL_TABLET | Freq: Every day | ORAL | Status: DC
  Administered 2018-10-04 – 2018-10-07 (×4): 100 ug via ORAL
  Filled 2018-10-04 (×7): qty 1

## 2018-10-04 MED ORDER — ONDANSETRON HCL 4 MG PO TABS: 4.0000 mg | ORAL_TABLET | Freq: Four times a day (QID) | ORAL | Status: DC | PRN

## 2018-10-04 MED ORDER — LEVOTHYROXINE SODIUM 75 MCG PO TABS
75.0000 ug | ORAL_TABLET | Freq: Every day | ORAL | Status: DC
  Administered 2018-10-05 – 2018-10-07 (×3): 75 ug via ORAL
  Filled 2018-10-04 (×3): qty 1

## 2018-10-04 MED ORDER — ONDANSETRON HCL 4 MG/2ML IJ SOLN: 4.0000 mg | Freq: Four times a day (QID) | INTRAMUSCULAR | Status: DC | PRN

## 2018-10-04 MED ORDER — CARBIDOPA-LEVODOPA ER 50-200 MG PO TBCR
1.0000 | EXTENDED_RELEASE_TABLET | Freq: Every day | ORAL | Status: DC
  Administered 2018-10-04 – 2018-10-06 (×3): 1 via ORAL
  Filled 2018-10-04 (×5): qty 1

## 2018-10-04 MED ORDER — CARBIDOPA-LEVODOPA ER 25-100 MG PO TBCR
1.0000 | EXTENDED_RELEASE_TABLET | Freq: Three times a day (TID) | ORAL | Status: DC
  Administered 2018-10-04 – 2018-10-07 (×11): 1 via ORAL
  Filled 2018-10-04 (×11): qty 1

## 2018-10-04 MED ORDER — POTASSIUM CHLORIDE 10 MEQ/100ML IV SOLN
10.0000 meq | INTRAVENOUS | Status: AC
  Administered 2018-10-04 (×2): 10 meq via INTRAVENOUS
  Filled 2018-10-04 (×2): qty 100

## 2018-10-04 NOTE — ED Notes (Signed)
Have applied sacral dressing to pressure ulcer, changed sheets, and stopped first bag of Potassium

## 2018-10-04 NOTE — ED Provider Notes (Signed)
[facility] EMERGENCY DEPARTMENT Provider Note   CSN: [id number] Arrival date & time: 10/04/18  0245    History   Chief Complaint Chief Complaint  Patient presents with  . Fall    HPI Abigail [facility] is a 85 y.o. female.   The history is provided by the patient and the EMS personnel.  Fall  She has history of hyperlipidemia, hypothyroidism, Parkinson's disease, paroxysmal atrial fibrillation, sick sinus syndrome with pacemaker insertion and comes in because of a fall at home.  She got out of bed to go to the bathroom and had a syncopal episode.  She did suffer a skin tear to her left forearm.  She does not recall hitting her head.  She is anticoagulated on rivaroxaban.  She was brought to the ED by ambulance and EMS noted she was hypotensive and gave IV fluids with improvement in blood pressure.  She apparently is supposed to be taking amiodarone for her atrial fibrillation, but she stopped on her own because she was concerned about side effects.  Of note, she states she is aware that her heart is racing in the ED, but did not notice it at home.  Past Medical History:  Diagnosis Date  . Angina at rest, with the tachycardia 07/05/2011  . Aortic insufficiency 07/05/2011  . Atrial fibrillation ([place])   . Brady-tachy syndrome ([place])    Dual chamber Medtronic pacemaker Adapta  . Chronic back pain   . Constipation 12/27/2011  . Gait instability   . Hyperlipidemia   . Hypothyroidism   . Normal cardiac stress test 2013  . OA (osteoarthritis)   . Pacemaker   . Parkinson's disease ([place])   . Tremor     Patient Active Problem List   Diagnosis Date Noted  . Anticoagulated   . Epigastric pain   . Diverticulosis   . Melena 11/04/2017  . Atrial fibrillation with RVR ([place]) 09/29/2017  . RBD (REM behavioral disorder) 01/10/2017  . Neurogenic orthostatic hypotension ([place]) 01/10/2017  . Tachy-brady syndrome ([place]) 06/23/2016  . Closed subcapital fracture of left femur ([place]) 06/23/2016  .  Orthostatic hypotension due to Parkinson's disease ([place]) 06/23/2016  . PD (Parkinson's disease) ([place]) 04/21/2016  . Hypokalemia 04/21/2016  . Normal coronary arteries 2001 05/08/2014  . Pacemaker 08/10/2012  . Aortic insufficiency 08/10/2012  . Constipation 12/27/2011  . Family hx of colon cancer 12/27/2011  . HTN (hypertension) 07/06/2011  . Hypothyroidism 07/05/2011  . Paroxysmal atrial fibrillation ([place]) 07/05/2011  . Sinus pause, 7 seconds post conversion, S/P MDT pacemaker 07/05/11 07/05/2011  . Dyslipidemia 07/05/2011  . Angina at rest, with rapid AF 07/05/2011    Past Surgical History:  Procedure Laterality Date  . ABDOMINAL HYSTERECTOMY    . CARDIAC CATHETERIZATION  05/26/1999   normal  . CARDIAC CATHETERIZATION  2001   normal coronary arteries  . cataract    . COLONOSCOPY N/A 11/15/2012   Procedure: COLONOSCOPY;  Surgeon: [name], MD;  [facility];  Service: Endoscopy;  Laterality: N/A;  1030  . ESOPHAGOGASTRODUODENOSCOPY (EGD) WITH PROPOFOL N/A 11/06/2017   Procedure: ESOPHAGOGASTRODUODENOSCOPY (EGD) WITH PROPOFOL;  Surgeon: [name], MD;  [facility];  Service: Endoscopy;  Laterality: N/A;  . HIP ARTHROPLASTY Left 06/24/2016   Procedure: ARTHROPLASTY BIPOLAR HIP (HEMIARTHROPLASTY);  Surgeon: Altamese Lyons, MD;  Location: [facility];  Service: Orthopedics;  Laterality: Left;  . INSERT / REPLACE / REMOVE PACEMAKER    . NM MYOVIEW LTD  07/19/2011   normal  .  PERMANENT PACEMAKER INSERTION  07/05/2011   Medtronic Adapta dual chamber  . PERMANENT PACEMAKER INSERTION N/A 07/05/2011   Procedure: PERMANENT PACEMAKER INSERTION;  Surgeon: [name], MD;  Location: [facility];  Service: Cardiovascular;  Laterality: N/A;     OB History   No obstetric history on file.      Home Medications    Prior to Admission medications   Medication Sig Start Date End Date Taking? Authorizing Provider  acetaminophen (TYLENOL) 500 MG tablet Take 1,000  mg by mouth every 6 (six) hours as needed for mild pain.    [provider]  apixaban (ELIQUIS) 2.5 MG TABS tablet Take 1 tablet (2.5 mg total) by mouth 2 (two) times daily. 07/31/18   Croitoru, Mihai, MD  carbidopa-levodopa (SINEMET CR) 50-200 MG tablet TAKE 1 TABLET BY MOUTH EVERYDAY AT BEDTIME 09/03/18   Tat, Rebecca S, DO  Carbidopa-Levodopa ER (SINEMET CR) 25-100 MG tablet controlled release 2 at 7am/2 at 11am/1 at 4pm 08/14/18   Tat, Rebecca S, DO  fludrocortisone (FLORINEF) 0.1 MG tablet TAKE 1 TABLET BY MOUTH EVERY DAY 04/23/18   Croitoru, Mihai, MD  levothyroxine (SYNTHROID, LEVOTHROID) 75 MCG tablet Take 75 mcg by mouth daily before breakfast.    [provider]  LINZESS 145 MCG CAPS capsule Take 145 mcg by mouth daily. 12/14/17   [provider]  oxyCODONE (OXY IR/ROXICODONE) 5 MG immediate release tablet Take 0.5 tablets (2.5 mg total) by mouth every 8 (eight) hours as needed for severe pain. 08/16/18   Quintella Reichert, MD  rosuvastatin (CRESTOR) 10 MG tablet Take 10 mg by mouth at bedtime. Reported on 07/09/2015    [provider]  XIIDRA 5 % SOLN Place 1 drop into the left eye 2 (two) times daily.  10/04/16   [provider]    Family History Family History  Problem Relation Age of Onset  . Colon cancer Mother 21  . Heart attack Mother   . Cancer Father   . Kidney cancer Sister   . Healthy Brother   . Healthy Son   . Healthy Daughter   . Suicidality Son     Social History Social History   Tobacco Use  . Smoking status: Never Smoker  . Smokeless tobacco: Never Used  Substance Use Topics  . Alcohol use: No    Alcohol/week: 0.0 standard drinks  . Drug use: No     Allergies   Patient has no known allergies.   Review of Systems Review of Systems  All other systems reviewed and are negative.    Physical Exam Updated Vital Signs BP 116/76 (BP Location: Right Arm)   Pulse (!) 135   Temp 98.6 F (37 C) (Oral)   Resp 16    SpO2 96%   Physical Exam Vitals signs and nursing note reviewed.    85 year old female, resting comfortably and in no acute distress. Vital signs are significant for rapid heart rate. Oxygen saturation is 96%, which is normal. Head is normocephalic and atraumatic. PERRLA, EOMI. Oropharynx is clear. Neck is nontender without adenopathy or JVD. Back is nontender and there is no CVA tenderness. Lungs are clear without rales, wheezes, or rhonchi. Chest is nontender. Heart is tachycardic and irregular without murmur. Abdomen is soft, flat, nontender without masses or hepatosplenomegaly and peristalsis is normoactive. Extremities have no cyanosis or edema, full range of motion is present.  Skin tear present on left forearm. Skin is warm and dry without rash. Neurologic: Mental status  is normal, cranial nerves are intact, there are no motor or sensory deficits.  ED Treatments / Results  Labs (all labs ordered are listed, but only abnormal results are displayed) Labs Reviewed - No data to display  EKG EKG Interpretation  Date/Time:  Thursday October 04 2018 02:55:51 EDT Ventricular Rate:  145 PR Interval:    QRS Duration: 132 QT Interval:  351 QTC Calculation: 546 R Axis:   91 Text Interpretation:  Atrial fibrillation with rapid ventricular response RBBB and LPFB Low voltage QRS When compared with ECG of 11/04/2017, Atrial fibrillation with rapid ventricular response has replaced Sinus rhythm Confirmed by Delora Fuel ([id number]) on 10/04/2018 2:58:42 AM   Radiology No results found.  Procedures Procedures  CRITICAL CARE Performed by: Delora Fuel Total critical care time: 130 minutes Critical care time was exclusive of separately billable procedures and treating other patients. Critical care was necessary to treat or prevent imminent or life-threatening deterioration. Critical care was time spent personally by me on the following activities: development of treatment plan with  patient and/or surrogate as well as nursing, discussions with consultants, evaluation of patient's response to treatment, examination of patient, obtaining history from patient or surrogate, ordering and performing treatments and interventions, ordering and review of laboratory studies, ordering and review of radiographic studies, pulse oximetry and re-evaluation of patient's condition.  Medications Ordered in ED Medications  amiodarone (NEXTERONE PREMIX) 360-4.14 MG/200ML-% (1.8 mg/mL) IV infusion (60 mg/hr Intravenous New Bag/Given 10/04/18 0345)  amiodarone (NEXTERONE PREMIX) 360-4.14 MG/200ML-% (1.8 mg/mL) IV infusion (has no administration in time range)  potassium chloride 10 mEq in 100 mL IVPB (10 mEq Intravenous New Bag/Given 10/04/18 0527)  potassium chloride SA (K-DUR) CR tablet 40 mEq (40 mEq Oral Given 10/04/18 0528)     Initial Impression / Assessment and Plan / ED Course  I have reviewed the triage vital signs and the nursing notes.  Pertinent labs & imaging results that were available during my care of the patient were reviewed by me and considered in my medical decision making (see chart for details).  Syncope secondary to atrial fibrillation with rapid ventricular response.  Chronic anticoagulation.  She will be sent for CT of head and cervical spine and will check screening labs.  On review of old records, she has intolerance for both beta-blockers and calcium channel blockers.  We will start infusion of amiodarone to try to achieve rate control and stabilize rhythm.  COVID-19 screen initiated.  On amiodarone, heart rate has decreased but is still over 110.  We will continue amiodarone infusion.  CT of head and cervical spine showed no acute injury.  Electrolytes do show moderate to severe hypokalemia and she is given both oral and intravenous potassium.  COVID-19 screen is negative.  Case is discussed with Dr. Clearence Ped of [facility] hospitalist, who agrees to admit the patient.   CHA2DS2/VAS Stroke Risk Points  Current as of a minute ago     4 >= 2 Points: High Risk  1 - 1.99 Points: Medium Risk  0 Points: Low Risk    This is the only CHA2DS2/VAS Stroke Risk Points available for the past  year.: Last Change: N/A     Details    This score determines the patient's risk of having a stroke if the  patient has atrial fibrillation.       Points Metrics  0 Has Congestive Heart Failure:  No    Current as of a minute ago  0 Has Vascular Disease:  No  Current as of a minute ago  1 Has Hypertension:  Yes    Current as of a minute ago  2 Age:  62    Current as of a minute ago  0 Has Diabetes:  No    Current as of a minute ago  0 Had Stroke:  No  Had TIA:  No  Had thromboembolism:  No    Current as of a minute ago  1 Female:  Yes    Current as of a minute ago .  Final Clinical Impressions(s) / ED Diagnoses   Final diagnoses:  Atrial fibrillation with rapid ventricular response ([place])  Syncope, unspecified syncope type  Hypokalemia  Chronic anticoagulation    ED Discharge Orders    None       Delora Fuel, MD [id number] [phone]

## 2018-10-04 NOTE — Progress Notes (Signed)
*  PRELIMINARY RESULTS* Echocardiogram 2D Echocardiogram has been performed.  Abigail [facility] 10/04/2018, 2:25 PM

## 2018-10-04 NOTE — ED Triage Notes (Signed)
Pt brought from home via [facility]. Pt C/O left arm skin tear after falling. Pt states she awoke from sleep and got up from the bed and "passed out."

## 2018-10-04 NOTE — ED Notes (Signed)
Pt has large pressure ulcer on sacrum. Will apply dressing.

## 2018-10-04 NOTE — Consult Note (Addendum)
Cardiology Consultation:   Patient ID: Abigail Taylor MRN: [id number]; DOB: 26-Sep-1931  Admit date: 10/04/2018 Date of Consult: 10/04/2018  Primary Care Provider: [name], MD Primary Cardiologist: [name], MD Primary Electrophysiologist:  None    Patient Profile:   Abigail Taylor is a 85 y.o. female with a hx of sinus node dysfunction, PAF and persistent a fib, Tachy-Brady syndrome with PPM Medtronic Adapta, normal cors on angiogram in 2001 and normal nuc 2013, orthostatic hypotension, related to Parkinson's syndrome now admitted who is being seen today for the evaluation of syncope at the request of Dr. Carles Collet.  History of Present Illness:   Abigail Taylor with above hx presented to ER after getting out of bed this AM and felt dizzy and fell to ground, may have hit her head on the dresser. When she got up she felt she would pass out but seems to remember trying to hold on and her husband helping her.  She does have a hx of orthostatic hypotension related to her Parkinson's disease.  She does take florinef for this.  She also noted her heart racing for last week intermittently.  Last echo 09/2017 with EF 50-55%  Mild AR, Mild MR, Bi Atrial enlargement.    EKG:  The EKG was personally reviewed and demonstrates:  A fib RVR at pk 159 and RBBB LPFB  Telemetry:  Telemetry was personally reviewed and demonstrates:  A fib wit RVR Pacer interrogation 07/2018 with no a fib since 09/2017   orthostatic BP 125/67 lying, sitting 139/78 and standing 108/68 and standing for 3 min  84/62.  Pulse increases with standing.   Na 141, K+ 2.9, Cr 0.68  Hgb 13.6 WBC 10 10.5 CT head and neck without acute issue. COVID neg  Currently BP 117/69  R 11 HR now 118 --was placed on amiodarone bolus and drip.  Her K+ is being replaced.  She had been on amiodarone but stopped.  She cannot take BP lowering meds due to orthostatic hypotension.  She is on eliquis for CHA2DS2VASC of 3.  HLD on statin.   No chest  pain.  No SOB   Heart Pathway Score:     Past Medical History:  Diagnosis Date   Angina at rest, with the tachycardia 07/05/2011   Aortic insufficiency 07/05/2011   Atrial fibrillation (HCC)    Brady-tachy syndrome ([place])    Dual chamber Medtronic pacemaker Adapta   Chronic back pain    Constipation 12/27/2011   Gait instability    Hyperlipidemia    Hypothyroidism    Normal cardiac stress test 2013   OA (osteoarthritis)    Pacemaker    Parkinson's disease [facility])    Tremor     Past Surgical History:  Procedure Laterality Date   ABDOMINAL HYSTERECTOMY     CARDIAC CATHETERIZATION  05/26/1999   normal   CARDIAC CATHETERIZATION  2001   normal coronary arteries   cataract     COLONOSCOPY N/A 11/15/2012   Procedure: COLONOSCOPY;  Surgeon: [name], MD;  [facility];  Service: Endoscopy;  Laterality: N/A;  1030   ESOPHAGOGASTRODUODENOSCOPY (EGD) WITH PROPOFOL N/A 11/06/2017   Procedure: ESOPHAGOGASTRODUODENOSCOPY (EGD) WITH PROPOFOL;  Surgeon: [name], MD;  Location: [facility];  Service: Endoscopy;  Laterality: N/A;   HIP ARTHROPLASTY Left 06/24/2016   Procedure: ARTHROPLASTY BIPOLAR HIP (HEMIARTHROPLASTY);  Surgeon: Altamese [place], MD;  Location: [facility];  Service: Orthopedics;  Laterality: Left;   INSERT / REPLACE / REMOVE PACEMAKER  NM MYOVIEW LTD  07/19/2011   normal   PERMANENT PACEMAKER INSERTION  07/05/2011   Medtronic Adapta dual chamber   PERMANENT PACEMAKER INSERTION N/A 07/05/2011   Procedure: PERMANENT PACEMAKER INSERTION;  Surgeon: [name], MD;  Location: [facility]ATH [facility];  Service: Cardiovascular;  Laterality: N/A;     Home Medications:  Prior to Admission medications   Medication Sig Start Date End Date Taking? Authorizing Provider  acetaminophen (TYLENOL) 500 MG tablet Take 1,000 mg by mouth every 6 (six) hours as needed for mild pain.    [provider]  apixaban (ELIQUIS) 2.5 MG TABS tablet Take 1  tablet (2.5 mg total) by mouth 2 (two) times daily. 07/31/18   Croitoru, Mihai, MD  carbidopa-levodopa (SINEMET CR) 50-200 MG tablet TAKE 1 TABLET BY MOUTH EVERYDAY AT BEDTIME 09/03/18   Tat, Rebecca S, DO  Carbidopa-Levodopa ER (SINEMET CR) 25-100 MG tablet controlled release 2 at 7am/2 at 11am/1 at 4pm 08/14/18   Tat, Rebecca S, DO  fludrocortisone (FLORINEF) 0.1 MG tablet TAKE 1 TABLET BY MOUTH EVERY DAY 04/23/18   Croitoru, Mihai, MD  levothyroxine (SYNTHROID, LEVOTHROID) 75 MCG tablet Take 75 mcg by mouth daily before breakfast.    [provider]  LINZESS 145 MCG CAPS capsule Take 145 mcg by mouth daily. 12/14/17   [provider]  oxyCODONE (OXY IR/ROXICODONE) 5 MG immediate release tablet Take 0.5 tablets (2.5 mg total) by mouth every 8 (eight) hours as needed for severe pain. 08/16/18   Quintella Reichert, MD  rosuvastatin (CRESTOR) 10 MG tablet Take 10 mg by mouth at bedtime. Reported on 07/09/2015    [provider]  XIIDRA 5 % SOLN Place 1 drop into the left eye 2 (two) times daily.  10/04/16   [provider]    Inpatient Medications: Scheduled Meds:  apixaban  2.5 mg Oral BID   carbidopa-levodopa  1 tablet Oral QHS   Carbidopa-Levodopa ER  1 tablet Oral TID AC   fludrocortisone  100 mcg Oral Daily   [START ON 10/05/2018] levothyroxine  75 mcg Oral QAC breakfast   Lifitegrast  1 drop Left Eye BID   linaclotide  145 mcg Oral Daily   rosuvastatin  10 mg Oral QHS   Continuous Infusions:  amiodarone 30 mg/hr (10/04/18 0954)   PRN Meds: acetaminophen, ondansetron **OR** ondansetron (ZOFRAN) IV, oxyCODONE  Allergies:   No Known Allergies  Social History:   Social History   Socioeconomic History   Marital status: Married    Spouse name: Not on file   Number of children: 3   Years of education: Not on file   Highest education level: Some college, no degree  Occupational History   Occupation: retired    Comment: Air cabin crew  Tobacco  Social Designer, fashion/clothing strain: Not on file   Food insecurity    Worry: Not on file    Inability: Not on Lexicographer needs    Medical: Not on file    Non-medical: Not on file  Tobacco Use   Smoking status: Never Smoker   Smokeless tobacco: Never Used  Substance and Sexual Activity   Alcohol use: No    Alcohol/week: 0.0 standard drinks   Drug use: No   Sexual activity: Not on file  Lifestyle   Physical activity    Days per week: Not on file    Minutes per session: Not on file   Stress: Not on file  Relationships   Social connections  Talks on phone: Not on file    Gets together: Not on file    Attends religious service: Not on file    Active member of club or organization: Not on file    Attends meetings of clubs or organizations: Not on file    Relationship status: Not on file   Intimate partner violence    Fear of current or ex partner: Not on file    Emotionally abused: Not on file    Physically abused: Not on file    Forced sexual activity: Not on file  Other Topics Concern   Not on file  Social History Narrative   Not on file    Family History:    Family History  Problem Relation Age of Onset   Colon cancer Mother 39   Heart attack Mother    Cancer Father    Kidney cancer Sister    Healthy Brother    Healthy Son    Healthy Daughter    Suicidality Son      ROS:  Please see the history of present illness.  General:no colds or fevers, no weight changes Skin:no rashes or ulcers HEENT:no blurred vision, no congestion CV:see HPI PUL:see HPI GI:no diarrhea constipation or melena, no indigestion GU:no hematuria, no dysuria MS:no joint pain, no claudication Neuro:no syncope, no lightheadedness Endo:no diabetes, no thyroid disease  All other ROS reviewed and negative.     Physical Exam/Data:   Vitals:   10/04/18 0700 10/04/18 0730 10/04/18 0930 10/04/18 1030  BP: 108/66 137/84 127/85 117/69    Pulse: (!) 119 97 (!) 119 (!) 109  Resp: 14 17 15  (!) 9  Temp:      TempSrc:      SpO2: 98% 97% 99% 99%   No intake or output data in the 24 hours ending 10/04/18 1056 Last 3 Weights 08/16/2018 08/14/2018 06/26/2018  Weight (lbs) 120 lb 120 lb 118 lb  Weight (kg) 54.432 kg 54.432 kg 53.524 kg     There is no height or weight on file to calculate BMI.  General:  Frail female, in no acute distress HEENT: normal Lymph: no adenopathy Neck: no JVD Endocrine:  No thryomegaly Vascular: No carotid bruits; pedal pulses 1+ bilaterally  Cardiac: irreg irreg; no murmur gallup rub or click Lungs:  clear to auscultation bilaterally, no wheezing, rhonchi or rales  Abd: soft, nontender, no hepatomegaly  Ext: no edema Musculoskeletal:  No deformities, BUE and BLE strength normal and equal Skin: warm and dry  Neuro:  Alert and oriented, but fairly poor historian, no focal abnormalities noted Psych:  Normal to flat affect    Relevant CV Studies: Echo 09/30/17  Study Conclusions  - Left ventricle: The cavity size was normal. Wall thickness was   normal. Systolic function was normal. The estimated ejection   fraction was in the range of 50% to 55%. Wall motion was normal;   there were no regional wall motion abnormalities. - Aortic valve: There was mild regurgitation. - Mitral valve: There was mild regurgitation. - Left atrium: The atrium was moderately dilated. - Right atrium: The atrium was mildly dilated.  Impressions:  - Normal LV systolic function; mild AI; mild MR; biatrial   enlargement; mild TR.   Laboratory Data:  High Sensitivity Troponin:  No results for input(s): TROPONINIHS in the last 720 hours.   Chemistry Recent Labs  Lab 10/04/18 0428  NA 141  K 2.9*  CL 107  CO2 25  GLUCOSE 117*  BUN 11  CREATININE 0.68  CALCIUM 8.3*  GFRNONAA >60  GFRAA >60  ANIONGAP 9    No results for input(s): PROT, ALBUMIN, AST, ALT, ALKPHOS, BILITOT in the last 168  hours. Hematology Recent Labs  Lab 10/04/18 0428  WBC 10.5  RBC 4.78  HGB 13.6  HCT 43.1  MCV 90.2  MCH 28.5  MCHC 31.6  RDW 13.2  PLT 230   BNPNo results for input(s): BNP, PROBNP in the last 168 hours.  DDimer No results for input(s): DDIMER in the last 168 hours.   Radiology/Studies:  Ct Head Wo Contrast  Result Date: 10/04/2018 CLINICAL DATA:  Minor head trauma, on anticoagulation EXAM: CT HEAD WITHOUT CONTRAST CT CERVICAL SPINE WITHOUT CONTRAST TECHNIQUE: Multidetector CT imaging of the head and cervical spine was performed following the standard protocol without intravenous contrast. Multiplanar CT image reconstructions of the cervical spine were also generated. COMPARISON:  11/02/2017 FINDINGS: CT HEAD FINDINGS Brain: No evidence of acute infarction, hemorrhage, hydrocephalus, extra-axial collection or mass lesion/mass effect. Age normal brain volume. Vascular: No hyperdense vessel or unexpected calcification. Skull: Negative for fracture Sinuses/Orbits: No evidence of injury. Bilateral cataract resection. Right mastoid and middle ear opacification scalping at the petrous apex but no acute or interval erosion. The opacification has progressed. Negative nasopharynx. CT CERVICAL SPINE FINDINGS Alignment: No traumatic malalignment. Skull base and vertebrae: Negative for acute fracture Soft tissues and spinal canal: No prevertebral fluid or swelling. No visible canal hematoma. Disc levels: Lower cervical degenerative disc narrowing. Overall mild degenerative changes for age Upper chest: No acute finding IMPRESSION: 1. No evidence of acute intracranial or cervical spine injury. 2. Right mastoid and middle ear opacification that has progressed from CT last year, please correlate with otoscopy. The nasopharynx is unremarkable. Electronically Signed   By: Monte Fantasia M.D.   On: 10/04/2018 05:31   Ct Cervical Spine Wo Contrast  Result Date: 10/04/2018 CLINICAL DATA:  Minor head trauma, on  anticoagulation EXAM: CT HEAD WITHOUT CONTRAST CT CERVICAL SPINE WITHOUT CONTRAST TECHNIQUE: Multidetector CT imaging of the head and cervical spine was performed following the standard protocol without intravenous contrast. Multiplanar CT image reconstructions of the cervical spine were also generated. COMPARISON:  11/02/2017 FINDINGS: CT HEAD FINDINGS Brain: No evidence of acute infarction, hemorrhage, hydrocephalus, extra-axial collection or mass lesion/mass effect. Age normal brain volume. Vascular: No hyperdense vessel or unexpected calcification. Skull: Negative for fracture Sinuses/Orbits: No evidence of injury. Bilateral cataract resection. Right mastoid and middle ear opacification scalping at the petrous apex but no acute or interval erosion. The opacification has progressed. Negative nasopharynx. CT CERVICAL SPINE FINDINGS Alignment: No traumatic malalignment. Skull base and vertebrae: Negative for acute fracture Soft tissues and spinal canal: No prevertebral fluid or swelling. No visible canal hematoma. Disc levels: Lower cervical degenerative disc narrowing. Overall mild degenerative changes for age Upper chest: No acute finding IMPRESSION: 1. No evidence of acute intracranial or cervical spine injury. 2. Right mastoid and middle ear opacification that has progressed from CT last year, please correlate with otoscopy. The nasopharynx is unremarkable. Electronically Signed   By: Monte Fantasia M.D.   On: 10/04/2018 05:31    Assessment and Plan:   1. PAF now in with RVR has been off amiodarone was afraid it would cause issues.  Now back on drip and rate was slower but with talking up to 130.  She has had rapid HR for a few days off and on.   Would resume amiodarone, po,  hopefully once K+ is replaced  she will convert back to SR.  She stated she had not missed eliquis at home, and receiving today's first dose now.  May need DCCV if does not convert.  Not sure her BP will tolerate atrial fib.  Echo has  been ordered.  Dr. Harl Bowie to see. 2. Syncope/near syncope due to orthostatic hypotension. On florinef --CT head neg for acute process 3. Hypokalemia replacing has rec'd 60 meq  will check Mg+  4. PPM medtronic, possible interrogation do not see it was checked in ER.   5. Anticoagulation on eliquis for a fib.  Dr. Sallyanne Kuster considered stopping with falls.  But felt benefit outweighed risk.   6. Parkinson's disease per IM      For questions or updates, please contact [facility] Please consult [email or web address] for contact info under     Signed, Abigail Kicks, NP  10/04/2018 10:56 AM   Patient seen and discussed with PA Dorene Ar, I agree with her documentation above. 85 yo female history of PAD with management complicated by orthostatic hypotension, has not tolerated av nodal agents at all and would not try again (severe hypotension and near syncope with very low dose trial of beta blocker during 09/2017 admission). Started on amio 09/2017 during admission. Also history of sick sinus with pacemaker, Parkinsons disease  Admitted with syncope after getting out of bed. In ER found to be in afib with RVR. She had stopped taking her amio at some point.      WBC 10.5 Hgb 13.6 Plt 230 K 2.9 Cr 0.68 Mg 1.9 COVID neg CT head neg Echo pending EKG afib RVR, RBBB   Previously no afib since 09/2017 based on device checks. Recurrence in setting of patient stopping her and also hypokalemia. Started on amio gtt without a bolus, already back in SR. Long discussion that amio remains her best option at this time, limited other options given severe orthostatic hypotension. She did very well on amio previously before she stopped. Finish 24 hr load and then convert to oral amio tomorrow   Zandra Abts MD

## 2018-10-04 NOTE — H&P (Signed)
History and Physical  Abigail Taylor [id number] DOB: [birth year] DOA: 10/04/2018   PCP: [name], MD   Patient coming from: Home  Chief Complaint: syncope  HPI:  Abigail Taylor is a 85 y.o. female with medical history of  tachybradycardia syndrome status post PPM 2012, paroxysmal atrial fibrillation on apixaban, Parkinson's disease, orthostatic hypotension, hyperlipidemia, hypothyroidism presenting with a syncopal episode.  The patient was getting up out of bed in the early morning 10/04/2018 when she felt dizzy and fell to the ground.  She felt like she may have hit her head on the dresser on the way down.  She states that there was no bladder or bowel incontinence, nor did she bite her tongue.  The patient does have a history of orthostatic hypotension secondary to her Parkinson's disease for which she takes Florinef.  However, the patient states that she has felt her heart racing intermittently for the past week prior to admission.  She states that she stopped taking amiodarone approximately 2 years ago because she was concerned about the side effects particularly in the setting of her Parkinson's disease. Nevertheless, the patient denies any recent fever, chills, chest pain, shortness of breath, headache, nausea, vomiting, diarrhea, abdominal pain, dysuria, hematuria.  She denies any other new medications.  She has not had any other recent syncopal episodes.  She her last follow-up with her cardiologist, Dr. Sallyanne Kuster in Jan 2020. In the emergency department, the patient was afebrile and hemodynamically stable with oxygen saturation 96% room air.  BMP showed low potassium 2.9.  CBC was unremarkable with her hemoglobin at baseline.  EKG showed atrial fibrillation with RVR with right bundle branch block.  CT of the brain and cervical spine were negative.  Assessment/Plan: Atrial fibrillation with RVR--paroxsymal -Continue amiodarone drip -Cardiology consult -Check TSH  -Echocardiogram -Continue apixaban -CHADS-VASc - 3  Syncope -Multifactorial including orthostatic hypotension and possibly atrial fibrillation -Check orthostatics -Remain on telemetry -Echo as discussed above  Tachybradycardia syndrome -Consult cardiology -Status post PPM 2012  Parkinson's disease with orthostatic hypotension -Continue Sinemet -willneed to tolerate higher blood pressure at baseline -follow up with Dr. Wells Guiles Noha Milberger -PT eval  Constipation -will need daily bowel regimen after d/c -daily miralax  Hypothyroidism -Continue Synthroid -TSH--  Hypokalemia -Replete -check mag  Hyperlipidemia -Continue statin       Past Medical History:  Diagnosis Date  . Angina at rest, with the tachycardia 07/05/2011  . Aortic insufficiency 07/05/2011  . Atrial fibrillation ([place])   . Brady-tachy syndrome ([place])    Dual chamber Medtronic pacemaker Adapta  . Chronic back pain   . Constipation 12/27/2011  . Gait instability   . Hyperlipidemia   . Hypothyroidism   . Normal cardiac stress test 2013  . OA (osteoarthritis)   . Pacemaker   . Parkinson's disease ([place])   . Tremor    Past Surgical History:  Procedure Laterality Date  . ABDOMINAL HYSTERECTOMY    . CARDIAC CATHETERIZATION  05/26/1999   normal  . CARDIAC CATHETERIZATION  2001   normal coronary arteries  . cataract    . COLONOSCOPY N/A 11/15/2012   Procedure: COLONOSCOPY;  Surgeon: [name], MD;  [facility];  Service: Endoscopy;  Laterality: N/A;  1030  . ESOPHAGOGASTRODUODENOSCOPY (EGD) WITH PROPOFOL N/A 11/06/2017   Procedure: ESOPHAGOGASTRODUODENOSCOPY (EGD) WITH PROPOFOL;  Surgeon: [name], MD;  Location: [facility];  Service: Endoscopy;  Laterality: N/A;  . HIP ARTHROPLASTY Left 06/24/2016   Procedure:  ARTHROPLASTY BIPOLAR HIP (HEMIARTHROPLASTY);  Surgeon: Altamese Elburn, MD;  Location: [facility];  Service: Orthopedics;  Laterality: Left;  . INSERT / REPLACE / REMOVE  PACEMAKER    . NM MYOVIEW LTD  07/19/2011   normal  . PERMANENT PACEMAKER INSERTION  07/05/2011   Medtronic Adapta dual chamber  . PERMANENT PACEMAKER INSERTION N/A 07/05/2011   Procedure: PERMANENT PACEMAKER INSERTION;  Surgeon: [name], MD;  Location: [facility];  Service: Cardiovascular;  Laterality: N/A;   Social History:  reports that she has never smoked. She has never used smokeless tobacco. She reports that she does not drink alcohol or use drugs.   Family History  Problem Relation Age of Onset  . Colon cancer Mother 53  . Heart attack Mother   . Cancer Father   . Kidney cancer Sister   . Healthy Brother   . Healthy Son   . Healthy Daughter   . Suicidality Son      No Known Allergies   Prior to Admission medications   Medication Sig Start Date End Date Taking? Authorizing Provider  acetaminophen (TYLENOL) 500 MG tablet Take 1,000 mg by mouth every 6 (six) hours as needed for mild pain.    [provider]  apixaban (ELIQUIS) 2.5 MG TABS tablet Take 1 tablet (2.5 mg total) by mouth 2 (two) times daily. 07/31/18   Croitoru, Mihai, MD  carbidopa-levodopa (SINEMET CR) 50-200 MG tablet TAKE 1 TABLET BY MOUTH EVERYDAY AT BEDTIME 09/03/18   Lexee Brashears, Rebecca S, DO  Carbidopa-Levodopa ER (SINEMET CR) 25-100 MG tablet controlled release 2 at 7am/2 at 11am/1 at 4pm 08/14/18   Zan Orlick, Rebecca S, DO  fludrocortisone (FLORINEF) 0.1 MG tablet TAKE 1 TABLET BY MOUTH EVERY DAY 04/23/18   Croitoru, Mihai, MD  levothyroxine (SYNTHROID, LEVOTHROID) 75 MCG tablet Take 75 mcg by mouth daily before breakfast.    [provider]  LINZESS 145 MCG CAPS capsule Take 145 mcg by mouth daily. 12/14/17   [provider]  oxyCODONE (OXY IR/ROXICODONE) 5 MG immediate release tablet Take 0.5 tablets (2.5 mg total) by mouth every 8 (eight) hours as needed for severe pain. 08/16/18   Quintella Reichert, MD  rosuvastatin (CRESTOR) 10 MG tablet Take 10 mg by mouth at bedtime. Reported on  07/09/2015    [provider]  XIIDRA 5 % SOLN Place 1 drop into the left eye 2 (two) times daily.  10/04/16   [provider]    Review of Systems:  Constitutional:  No weight loss, night sweats, Fevers, chills, fatigue.  Head&Eyes: No headache.  No vision loss.  No eye pain or scotoma ENT:  No Difficulty swallowing,Tooth/dental problems,Sore throat,  No ear ache, post nasal drip,  Cardio-vascular:  No chest pain, Orthopnea, PND, swelling in lower extremities,    GI:  No  abdominal pain, nausea, vomiting, diarrhea, loss of appetite, hematochezia, melena, heartburn, indigestion, Resp:  No shortness of breath with exertion or at rest. No cough. No coughing up of blood .No wheezing.No chest wall deformity  Skin:  no rash or lesions.  GU:  no dysuria, change in color of urine, no urgency or frequency. No flank pain.  Musculoskeletal:  No joint pain or swelling. No decreased range of motion. No back pain.  Psych:  No change in mood or affect. No depression or anxiety. Neurologic: No headache, no dysesthesia, no focal weakness, no vision loss.   Physical Exam: Vitals:   10/04/18 0545 10/04/18 0600 10/04/18 0630 10/04/18 0645  BP: Marland Kitchen)  105/92 112/74 100/70 106/70  Pulse:   (!) 112 100  Resp:    20  Temp:      TempSrc:      SpO2: 97% 99% 96% 96%   General:  A&O x 3, NAD, nontoxic, pleasant/cooperative Head/Eye: No conjunctival hemorrhage, no icterus, /AT, No nystagmus ENT:  No icterus,  No thrush, good dentition, no pharyngeal exudate Neck:  No masses, no lymphadenpathy, no bruits CV:  IRRR, no rub, no gallop, no S3 Lung:  CTAB, good air movement, no wheeze, no rhonchi Abdomen: soft/NT, +BS, nondistended, no peritoneal signs Ext: No cyanosis, No rashes, No petechiae, No lymphangitis, No edema Neuro: CNII-XII intact, strength 4/5 in bilateral upper and lower extremities, no dysmetria  Labs on Admission:  Basic Metabolic Panel: Recent Labs  Lab 10/04/18  0428  NA 141  K 2.9*  CL 107  CO2 25  GLUCOSE 117*  BUN 11  CREATININE 0.68  CALCIUM 8.3*   Liver Function Tests: No results for input(s): AST, ALT, ALKPHOS, BILITOT, PROT, ALBUMIN in the last 168 hours. No results for input(s): LIPASE, AMYLASE in the last 168 hours. No results for input(s): AMMONIA in the last 168 hours. CBC: Recent Labs  Lab 10/04/18 0428  WBC 10.5  NEUTROABS 7.9*  HGB 13.6  HCT 43.1  MCV 90.2  PLT 230   Coagulation Profile: No results for input(s): INR, PROTIME in the last 168 hours. Cardiac Enzymes: No results for input(s): CKTOTAL, CKMB, CKMBINDEX, TROPONINI in the last 168 hours. BNP: Invalid input(s): POCBNP CBG: No results for input(s): GLUCAP in the last 168 hours. Urine analysis:    Component Value Date/Time   COLORURINE YELLOW 11/04/2017 2030   APPEARANCEUR CLEAR 11/04/2017 2030   LABSPEC 1.015 11/04/2017 2030   PHURINE 5.0 11/04/2017 2030   GLUCOSEU NEGATIVE 11/04/2017 2030   HGBUR SMALL (A) 11/04/2017 2030   [place] NEGATIVE 11/04/2017 2030   KETONESUR 5 (A) 11/04/2017 2030   PROTEINUR NEGATIVE 11/04/2017 2030   UROBILINOGEN 0.2 05/07/2014 2101   NITRITE NEGATIVE 11/04/2017 2030   LEUKOCYTESUR TRACE (A) 11/04/2017 2030   Sepsis Labs: @LABRCNTIP (procalcitonin:4,lacticidven:4) ) Recent Results (from the past 240 hour(s))  SARS Coronavirus 2 [facility] order, Performed in [facility] hospital lab) Nasopharyngeal Nasopharyngeal Swab     Status: None   Collection Time: 10/04/18  3:18 AM   Specimen: Nasopharyngeal Swab  Result Value Ref Range Status   SARS Coronavirus 2 NEGATIVE NEGATIVE Final    Comment: (NOTE) If result is NEGATIVE SARS-CoV-2 target nucleic acids are NOT DETECTED. The SARS-CoV-2 RNA is generally detectable in upper and lower  respiratory specimens during the acute phase of infection. The lowest  concentration of SARS-CoV-2 viral copies this assay can detect is 250  copies / mL. A negative result does not  preclude SARS-CoV-2 infection  and should not be used as the sole basis for treatment or other  patient management decisions.  A negative result may occur with  improper specimen collection / handling, submission of specimen other  than nasopharyngeal swab, presence of viral mutation(s) within the  areas targeted by this assay, and inadequate number of viral copies  (<250 copies / mL). A negative result must be combined with clinical  observations, patient history, and epidemiological information. If result is POSITIVE SARS-CoV-2 target nucleic acids are DETECTED. The SARS-CoV-2 RNA is generally detectable in upper and lower  respiratory specimens dur ing the acute phase of infection.  Positive  results are indicative of active infection with SARS-CoV-2.  Clinical  correlation with patient history and other diagnostic information is  necessary to determine patient infection status.  Positive results do  not rule out bacterial infection or co-infection with other viruses. If result is PRESUMPTIVE POSTIVE SARS-CoV-2 nucleic acids MAY BE PRESENT.   A presumptive positive result was obtained on the submitted specimen  and confirmed on repeat testing.  While 2019 novel coronavirus  (SARS-CoV-2) nucleic acids may be present in the submitted sample  additional confirmatory testing may be necessary for epidemiological  and / or clinical management purposes  to differentiate between  SARS-CoV-2 and other Sarbecovirus currently known to infect humans.  If clinically indicated additional testing with an alternate test  methodology [phone]) is advised. The SARS-CoV-2 RNA is generally  detectable in upper and lower respiratory sp ecimens during the acute  phase of infection. The expected result is Negative. Fact Sheet for Patients:  StrictlyIdeas.no Fact Sheet for Healthcare Providers: BankingDealers.co.za This test is not yet approved or cleared by  the [place] FDA and has been authorized for detection and/or diagnosis of SARS-CoV-2 by FDA under an Emergency Use Authorization (EUA).  This EUA will remain in effect (meaning this test can be used) for the duration of the COVID-19 declaration under Section 564(b)(1) of the Act, 21 U.S.C. section 360bbb-3(b)(1), unless the authorization is terminated or revoked sooner. Performed at [facility], [address], [place], [place] [address]      Radiological Exams on Admission: Ct Head Wo Contrast  Result Date: 10/04/2018 CLINICAL DATA:  Minor head trauma, on anticoagulation EXAM: CT HEAD WITHOUT CONTRAST CT CERVICAL SPINE WITHOUT CONTRAST TECHNIQUE: Multidetector CT imaging of the head and cervical spine was performed following the standard protocol without intravenous contrast. Multiplanar CT image reconstructions of the cervical spine were also generated. COMPARISON:  11/02/2017 FINDINGS: CT HEAD FINDINGS Brain: No evidence of acute infarction, hemorrhage, hydrocephalus, extra-axial collection or mass lesion/mass effect. Age normal brain volume. Vascular: No hyperdense vessel or unexpected calcification. Skull: Negative for fracture Sinuses/Orbits: No evidence of injury. Bilateral cataract resection. Right mastoid and middle ear opacification scalping at the petrous apex but no acute or interval erosion. The opacification has progressed. Negative nasopharynx. CT CERVICAL SPINE FINDINGS Alignment: No traumatic malalignment. Skull base and vertebrae: Negative for acute fracture Soft tissues and spinal canal: No prevertebral fluid or swelling. No visible canal hematoma. Disc levels: Lower cervical degenerative disc narrowing. Overall mild degenerative changes for age Upper chest: No acute finding IMPRESSION: 1. No evidence of acute intracranial or cervical spine injury. 2. Right mastoid and middle ear opacification that has progressed from CT last year, please correlate with otoscopy. The  nasopharynx is unremarkable. Electronically Signed   By: Monte Fantasia M.D.   On: 10/04/2018 05:31   Ct Cervical Spine Wo Contrast  Result Date: 10/04/2018 CLINICAL DATA:  Minor head trauma, on anticoagulation EXAM: CT HEAD WITHOUT CONTRAST CT CERVICAL SPINE WITHOUT CONTRAST TECHNIQUE: Multidetector CT imaging of the head and cervical spine was performed following the standard protocol without intravenous contrast. Multiplanar CT image reconstructions of the cervical spine were also generated. COMPARISON:  11/02/2017 FINDINGS: CT HEAD FINDINGS Brain: No evidence of acute infarction, hemorrhage, hydrocephalus, extra-axial collection or mass lesion/mass effect. Age normal brain volume. Vascular: No hyperdense vessel or unexpected calcification. Skull: Negative for fracture Sinuses/Orbits: No evidence of injury. Bilateral cataract resection. Right mastoid and middle ear opacification scalping at the petrous apex but no acute or interval erosion. The opacification has progressed. Negative nasopharynx. CT CERVICAL SPINE FINDINGS Alignment: No  traumatic malalignment. Skull base and vertebrae: Negative for acute fracture Soft tissues and spinal canal: No prevertebral fluid or swelling. No visible canal hematoma. Disc levels: Lower cervical degenerative disc narrowing. Overall mild degenerative changes for age Upper chest: No acute finding IMPRESSION: 1. No evidence of acute intracranial or cervical spine injury. 2. Right mastoid and middle ear opacification that has progressed from CT last year, please correlate with otoscopy. The nasopharynx is unremarkable. Electronically Signed   By: Monte Fantasia M.D.   On: 10/04/2018 05:31    EKG: Independently reviewed. afib RVR, RBBB    Time spent:60 minutes Code Status:   FULL Family Communication:  No Family at bedside Disposition Plan: expect 2-3 day hospitalization Consults called: cardio DVT Prophylaxis: apixaban  Orson Eva, DO  [facility] Pager  [phone]  If 7PM-7AM, please contact night-coverage [email or web address] Password [facility] 10/04/2018, 7:32 AM

## 2018-10-05 DIAGNOSIS — Z950 Presence of cardiac pacemaker: Secondary | ICD-10-CM

## 2018-10-05 DIAGNOSIS — I495 Sick sinus syndrome: Secondary | ICD-10-CM

## 2018-10-05 DIAGNOSIS — L8990 Pressure ulcer of unspecified site, unspecified stage: Secondary | ICD-10-CM | POA: Insufficient documentation

## 2018-10-05 DIAGNOSIS — G903 Multi-system degeneration of the autonomic nervous system: Secondary | ICD-10-CM

## 2018-10-05 LAB — URINE CULTURE: Culture: <10000 — AB

## 2018-10-05 LAB — BASIC METABOLIC PANEL
Anion gap: 11 (ref 5–15)
BUN: 11 mg/dL (ref 8–23)
CO2: 23 mmol/L (ref 22–32)
Calcium: 8.4 mg/dL — ABNORMAL LOW (ref 8.9–10.3)
Chloride: 108 mmol/L (ref 98–111)
Creatinine, Ser: 0.71 mg/dL (ref 0.44–1.00)
GFR calc Af Amer: >60 mL/min (ref >60)
GFR calc non Af Amer: >60 mL/min (ref >60)
Glucose, Bld: 97 mg/dL (ref 70–99)
Potassium: 3.5 mmol/L (ref 3.5–5.1)
Sodium: 142 mmol/L (ref 135–145)

## 2018-10-05 LAB — CBC
HCT: 43.5 % (ref 36.0–46.0)
Hemoglobin: 13.7 g/dL (ref 12.0–15.0)
MCH: 28.8 pg (ref 26.0–34.0)
MCHC: 31.5 g/dL (ref 30.0–36.0)
MCV: 91.4 fL (ref 80.0–100.0)
Platelets: 234 10*3/uL (ref 150–400)
RBC: 4.76 MIL/uL (ref 3.87–5.11)
RDW: 13.6 % (ref 11.5–15.5)
WBC: 10.8 10*3/uL — ABNORMAL HIGH (ref 4.0–10.5)
nRBC: 0 % (ref 0.0–0.2)

## 2018-10-05 LAB — MAGNESIUM: Magnesium: 1.8 mg/dL (ref 1.7–2.4)

## 2018-10-05 MED ORDER — JUVEN PO PACK
1.0000 | PACK | Freq: Two times a day (BID) | ORAL | Status: DC
  Administered 2018-10-05 – 2018-10-07 (×5): 1 via ORAL
  Filled 2018-10-05 (×5): qty 1

## 2018-10-05 MED ORDER — MAGNESIUM SULFATE 2 GM/50ML IV SOLN
2.0000 g | Freq: Once | INTRAVENOUS | Status: AC
  Administered 2018-10-05: 2 g via INTRAVENOUS
  Filled 2018-10-05: qty 50

## 2018-10-05 MED ORDER — POTASSIUM CHLORIDE CRYS ER 20 MEQ PO TBCR
40.0000 meq | EXTENDED_RELEASE_TABLET | Freq: Once | ORAL | Status: AC
  Administered 2018-10-05: 40 meq via ORAL
  Filled 2018-10-05: qty 2

## 2018-10-05 MED ORDER — AMIODARONE HCL 200 MG PO TABS
200.0000 mg | ORAL_TABLET | Freq: Every day | ORAL | Status: DC
  Administered 2018-10-05 – 2018-10-07 (×3): 200 mg via ORAL
  Filled 2018-10-05 (×3): qty 1

## 2018-10-05 MED ORDER — PNEUMOCOCCAL VAC POLYVALENT 25 MCG/0.5ML IJ INJ
0.5000 mL | INJECTION | INTRAMUSCULAR | Status: AC
  Administered 2018-10-06: 0.5 mL via INTRAMUSCULAR
  Filled 2018-10-05: qty 0.5

## 2018-10-05 MED ORDER — ENSURE ENLIVE PO LIQD
237.0000 mL | Freq: Two times a day (BID) | ORAL | Status: DC
  Administered 2018-10-05 – 2018-10-07 (×5): 237 mL via ORAL

## 2018-10-05 NOTE — Care Management Important Message (Signed)
Important Message  Patient Details  Name: Abigail Taylor MRN: [id number] Date of Birth: 02-27-1931   Medicare Important Message Given:  Yes     Tommy Medal 10/05/2018, 1:16 PM

## 2018-10-05 NOTE — Progress Notes (Signed)
Initial Nutrition Assessment  DOCUMENTATION CODES:   Not applicable  INTERVENTION:  -Ensure Enlive po BID, each supplement provides 350 kcal and 20 grams of protein  -Juven BID, each packet provides 95 calories, 2.5 grams of protein (collagen), and 9.8 grams of carbohydrate (3 grams sugar); also contains 7 grams of L-arginine and L-glutamine, 300 mg vitamin C, 15 mg vitamin E, 1.2 mcg vitamin B-12, 9.5 mg zinc, 200 mg calcium, and 1.5 g  Calcium Beta-hydroxy-Beta-methylbutyrate to support wound healing  -MVI daily  NUTRITION DIAGNOSIS:   Increased nutrient needs related to wound healing(stage III; mid; buttocks) as evidenced by estimated needs.  GOAL:   Patient will meet greater than or equal to 90% of their needs   MONITOR:   Weight trends, Skin, PO intake, Labs, I & O's  REASON FOR ASSESSMENT:   Low [place]    ASSESSMENT:  85 year old female with medical history significant of tachybradycardia syndrome s/p PPM 2012, a-fib, Parkinson's, chronic back pain, constipation, orthostatic hypotension, HLD, hypothyroidism who presented to ED with dizziness s/p syncopal episode after getting out of bed. Patient reported intermittent episodes of increased heart heart rate over the past week.  Patient admitted with A fib RVR 9/10 - 2D Echo  Patient on heart healthy diet; no meals documented at this time. Will order Ensure BID to assist with calorie and protein needs as well as Juven to promote wound healing.   Current wt 53.6 kg (117.9 lb) Wt history reviewed - stable (53.5 - 56.7 kg over the past year)  Medications reviewed and include: sinemet, synthroid,  k-dur 40 mEq  Labs: WBC 10.8  NUTRITION - FOCUSED PHYSICAL EXAM: Deferred  Diet Order:   Diet Order            Diet Heart Room service appropriate? Yes; Fluid consistency: Thin  Diet effective now              EDUCATION NEEDS:   No education needs have been identified at this time  Skin:  Skin Assessment:  Reviewed RN Assessment(stage III; mid; sacrum)  Last BM:  9/9  Height:   Ht Readings from Last 1 Encounters:  10/04/18 5\' 6"  (1.676 m)    Weight:   Wt Readings from Last 1 Encounters:  10/05/18 53.6 kg    Ideal Body Weight:  59.1 kg  BMI:  Body mass index is 19.07 kg/m.  Estimated Nutritional Needs:   Kcal:  1400-1600  Protein:  80-86 (1.5-1.6g/kg)  Fluid:  >1.4L   Lajuan Lines, RD, LDN Office [phone] After Hours/Weekend Pager: [phone]

## 2018-10-05 NOTE — Progress Notes (Signed)
PROGRESS NOTE  Abigail Taylor [id number] DOB: Dec 26, 1931 DOA: 10/04/2018 PCP: [name], MD  Brief History:  86 y.o. female with medical history of tachybradycardia syndrome status post PPM 2012, paroxysmal atrial fibrillation on apixaban, Parkinson's disease, orthostatic hypotension, hyperlipidemia, hypothyroidism presenting with a syncopal episode.  The patient was getting up out of bed in the early morning 10/04/2018 when she felt dizzy and fell to the ground.  She felt like she may have hit her head on the dresser on the way down.  She states that there was no bladder or bowel incontinence, nor did she bite her tongue.  The patient does have a history of orthostatic hypotension secondary to her Parkinson's disease for which she takes Florinef.  However, the patient states that she has felt her heart racing intermittently for the past week prior to admission.  She states that she stopped taking amiodarone approximately 2 years ago because she was concerned about the side effects particularly in the setting of her Parkinson's disease. Nevertheless, the patient denies any recent fever, chills, chest pain, shortness of breath, headache, nausea, vomiting, diarrhea, abdominal pain, dysuria, hematuria.  She denies any other new medications.  She has not had any other recent syncopal episodes.  She her last follow-up with her cardiologist, Dr. Sallyanne Kuster in Jan 2020. In the emergency department, the patient was afebrile and hemodynamically stable with oxygen saturation 96% room air.  BMP showed low potassium 2.9.  CBC was unremarkable with her hemoglobin at baseline.  EKG showed atrial fibrillation with RVR with right bundle branch block.  CT of the brain and cervical spine were negative.   Assessment/Plan:  Atrial fibrillation with RVR--paroxsymal -Continue amiodarone drip -Cardiology consult--started on IV amio-->converted to NSR -transition to po amiodarone -Check TSH--pending  -Echocardiogram--EF 60-65%, mild-mod TR/AI -Continue apixaban -CHADS-VASc - 3 -try to keep K>4 and mag >2 -Has not tolerated AV nodal blocking agents due to severe hypotension and near syncope in past.   Syncope -Multifactorial including orthostatic hypotension and possibly atrial fibrillation -Check orthostatics--neg -Remain on telemetry -Echo as discussed above  Tachybradycardia syndrome -Consult cardiology--started on IV amio-->converted to NSR -transition to po amiodarone -Status post PPM 2012 -Has not tolerated AV nodal blocking agents due to severe hypotension and near syncope in past.  Parkinson's disease with orthostatic hypotension -Continue Sinemet -willneed to tolerate higher blood pressure at baseline -follow up with Dr. Wells Guiles [place] -PT eval  Constipation -will need daily bowel regimen after d/c -daily miralax  Hypothyroidism -Continue Synthroid -TSH--  Hypokalemia -Replete -check mag  Hyperlipidemia -Continue statin  Stage 2 sacral ulcer -present on admission -not infected on exam -continue local care      Disposition Plan:   Home 9/12 if stable Family Communication: spouse updated at bedside 9/11  Consultants:  cardiology  Code Status:  FULL   DVT Prophylaxis:  apixaban   Procedures: As Listed in Progress Note Above  Antibiotics: None       Subjective: Patient denies fevers, chills, headache, chest pain, dyspnea, nausea, vomiting, diarrhea, abdominal pain, dysuria, hematuria, hematochezia, and melena.   Objective: Vitals:   10/05/18 1200 10/05/18 1300 10/05/18 1400 10/05/18 1500  BP: (!) 144/93 135/64 (!) 143/74 (!) 167/76  Pulse: 69 65 61 70  Resp:      Temp:      TempSrc:      SpO2: 98% 99% 98% 98%  Weight:      Height:  Intake/Output Summary (Last 24 hours) at 10/05/2018 1622 Last data filed at 10/05/2018 1526 Gross per 24 hour  Intake 372.12 ml  Output 100 ml  Net 272.12 ml   Weight change:   Exam:   General:  Pt is alert, follows commands appropriately, not in acute distress  HEENT: No icterus, No thrush, No neck mass, [place]/AT  Cardiovascular: RRR, S1/S2, no rubs, no gallops  Respiratory: CTA bilaterally, no wheezing, no crackles, no rhonchi  Abdomen: Soft/+BS, non tender, non distended, no guarding  Extremities: No edema, No lymphangitis, No petechiae, No rashes, no synovitis   Data Reviewed: I have personally reviewed following labs and imaging studies Basic Metabolic Panel: Recent Labs  Lab 10/04/18 0428 10/05/18 0519  NA 141 142  K 2.9* 3.5  CL 107 108  CO2 25 23  GLUCOSE 117* 97  BUN 11 11  CREATININE 0.68 0.71  CALCIUM 8.3* 8.4*  MG 1.9 1.8   Liver Function Tests: No results for input(s): AST, ALT, ALKPHOS, BILITOT, PROT, ALBUMIN in the last 168 hours. No results for input(s): LIPASE, AMYLASE in the last 168 hours. No results for input(s): AMMONIA in the last 168 hours. Coagulation Profile: No results for input(s): INR, PROTIME in the last 168 hours. CBC: Recent Labs  Lab 10/04/18 0428 10/05/18 0519  WBC 10.5 10.8*  NEUTROABS 7.9*  --   HGB 13.6 13.7  HCT 43.1 43.5  MCV 90.2 91.4  PLT 230 234   Cardiac Enzymes: No results for input(s): CKTOTAL, CKMB, CKMBINDEX, TROPONINI in the last 168 hours. BNP: Invalid input(s): POCBNP CBG: No results for input(s): GLUCAP in the last 168 hours. HbA1C: No results for input(s): HGBA1C in the last 72 hours. Urine analysis:    Component Value Date/Time   COLORURINE YELLOW 10/04/2018 0903   APPEARANCEUR HAZY (A) 10/04/2018 0903   LABSPEC 1.024 10/04/2018 0903   PHURINE 5.0 10/04/2018 0903   GLUCOSEU NEGATIVE 10/04/2018 0903   HGBUR NEGATIVE 10/04/2018 0903   BILIRUBINUR NEGATIVE 10/04/2018 0903   KETONESUR 5 (A) 10/04/2018 0903   PROTEINUR NEGATIVE 10/04/2018 0903   UROBILINOGEN 0.2 05/07/2014 2101   NITRITE NEGATIVE 10/04/2018 0903   LEUKOCYTESUR NEGATIVE 10/04/2018 0903   Sepsis Labs:  @[email or web address] (procalcitonin:4,lacticidven:4) ) Recent Results (from the past 240 hour(s))  SARS Coronavirus 2 [facility] order, Performed in [facility] hospital lab) Nasopharyngeal Nasopharyngeal Swab     Status: None   Collection Time: 10/04/18  3:18 AM   Specimen: Nasopharyngeal Swab  Result Value Ref Range Status   SARS Coronavirus 2 NEGATIVE NEGATIVE Final    Comment: (NOTE) If result is NEGATIVE SARS-CoV-2 target nucleic acids are NOT DETECTED. The SARS-CoV-2 RNA is generally detectable in upper and lower  respiratory specimens during the acute phase of infection. The lowest  concentration of SARS-CoV-2 viral copies this assay can detect is 250  copies / mL. A negative result does not preclude SARS-CoV-2 infection  and should not be used as the sole basis for treatment or other  patient management decisions.  A negative result may occur with  improper specimen collection / handling, submission of specimen other  than nasopharyngeal swab, presence of viral mutation(s) within the  areas targeted by this assay, and inadequate number of viral copies  (<250 copies / mL). A negative result must be combined with clinical  observations, patient history, and epidemiological information. If result is POSITIVE SARS-CoV-2 target nucleic acids are DETECTED. The SARS-CoV-2 RNA is generally detectable in upper and lower  respiratory specimens dur ing the  acute phase of infection.  Positive  results are indicative of active infection with SARS-CoV-2.  Clinical  correlation with patient history and other diagnostic information is  necessary to determine patient infection status.  Positive results do  not rule out bacterial infection or co-infection with other viruses. If result is PRESUMPTIVE POSTIVE SARS-CoV-2 nucleic acids MAY BE PRESENT.   A presumptive positive result was obtained on the submitted specimen  and confirmed on repeat testing.  While 2019 novel coronavirus  (SARS-CoV-2) nucleic  acids may be present in the submitted sample  additional confirmatory testing may be necessary for epidemiological  and / or clinical management purposes  to differentiate between  SARS-CoV-2 and other Sarbecovirus currently known to infect humans.  If clinically indicated additional testing with an alternate test  methodology [phone]) is advised. The SARS-CoV-2 RNA is generally  detectable in upper and lower respiratory sp ecimens during the acute  phase of infection. The expected result is Negative. Fact Sheet for Patients:  StrictlyIdeas.no Fact Sheet for Healthcare Providers: BankingDealers.co.za This test is not yet approved or cleared by the [place] FDA and has been authorized for detection and/or diagnosis of SARS-CoV-2 by FDA under an Emergency Use Authorization (EUA).  This EUA will remain in effect (meaning this test can be used) for the duration of the COVID-19 declaration under Section 564(b)(1) of the Act, 21 U.S.C. section 360bbb-3(b)(1), unless the authorization is terminated or revoked sooner. Performed at [facility], [address] [place], [place] [address]   Culture, Urine     Status: Abnormal   Collection Time: 10/04/18  9:03 AM   Specimen: Urine, Clean Catch  Result Value Ref Range Status   Specimen Description   Final    URINE, CLEAN CATCH Performed at [facility], [address], [place], [place] [address]    Special Requests   Final    NONE Performed at [facility], [address], [place], [place] [address]    Culture (A)  Final    <10,000 COLONIES/mL INSIGNIFICANT GROWTH Performed at [facility] [address], [place],  [address]    Report Status 10/05/2018 FINAL  Final  MRSA PCR Screening     Status: None   Collection Time: 10/04/18 11:35 AM   Specimen: Nasopharyngeal  Result Value Ref Range Status   MRSA by PCR NEGATIVE NEGATIVE Final    Comment:        The GeneXpert MRSA  Assay (FDA approved for NASAL specimens only), is one component of a comprehensive MRSA colonization surveillance program. It is not intended to diagnose MRSA infection nor to guide or monitor treatment for MRSA infections. Performed at [facility], [address] [place],  [address]      Scheduled Meds: . amiodarone  200 mg Oral Daily  . apixaban  2.5 mg Oral BID  . carbidopa-levodopa  1 tablet Oral QHS  . Carbidopa-Levodopa ER  1 tablet Oral TID AC  . Chlorhexidine Gluconate Cloth  6 each Topical Daily  . feeding supplement (ENSURE ENLIVE)  237 mL Oral BID BM  . fludrocortisone  100 mcg Oral Daily  . levothyroxine  75 mcg Oral QAC breakfast  . Lifitegrast  1 drop Left Eye BID  . linaclotide  145 mcg Oral Daily  . nutrition supplement (JUVEN)  1 packet Oral BID BM  . [START ON 10/06/2018] pneumococcal 23 valent vaccine  0.5 mL Intramuscular Tomorrow-1000  . rosuvastatin  10 mg Oral QHS   Continuous Infusions: .  magnesium sulfate bolus IVPB      Procedures/Studies: Ct Head Wo Contrast  Result Date: 10/04/2018 CLINICAL DATA:  Minor head trauma, on anticoagulation EXAM: CT HEAD WITHOUT CONTRAST CT CERVICAL SPINE WITHOUT CONTRAST TECHNIQUE: Multidetector CT imaging of the head and cervical spine was performed following the standard protocol without intravenous contrast. Multiplanar CT image reconstructions of the cervical spine were also generated. COMPARISON:  11/02/2017 FINDINGS: CT HEAD FINDINGS Brain: No evidence of acute infarction, hemorrhage, hydrocephalus, extra-axial collection or mass lesion/mass effect. Age normal brain volume. Vascular: No hyperdense vessel or unexpected calcification. Skull: Negative for fracture Sinuses/Orbits: No evidence of injury. Bilateral cataract resection. Right mastoid and middle ear opacification scalping at the petrous apex but no acute or interval erosion. The opacification has progressed. Negative nasopharynx. CT CERVICAL SPINE  FINDINGS Alignment: No traumatic malalignment. Skull base and vertebrae: Negative for acute fracture Soft tissues and spinal canal: No prevertebral fluid or swelling. No visible canal hematoma. Disc levels: Lower cervical degenerative disc narrowing. Overall mild degenerative changes for age Upper chest: No acute finding IMPRESSION: 1. No evidence of acute intracranial or cervical spine injury. 2. Right mastoid and middle ear opacification that has progressed from CT last year, please correlate with otoscopy. The nasopharynx is unremarkable. Electronically Signed   By: Monte Fantasia M.D.   On: 10/04/2018 05:31   Ct Cervical Spine Wo Contrast  Result Date: 10/04/2018 CLINICAL DATA:  Minor head trauma, on anticoagulation EXAM: CT HEAD WITHOUT CONTRAST CT CERVICAL SPINE WITHOUT CONTRAST TECHNIQUE: Multidetector CT imaging of the head and cervical spine was performed following the standard protocol without intravenous contrast. Multiplanar CT image reconstructions of the cervical spine were also generated. COMPARISON:  11/02/2017 FINDINGS: CT HEAD FINDINGS Brain: No evidence of acute infarction, hemorrhage, hydrocephalus, extra-axial collection or mass lesion/mass effect. Age normal brain volume. Vascular: No hyperdense vessel or unexpected calcification. Skull: Negative for fracture Sinuses/Orbits: No evidence of injury. Bilateral cataract resection. Right mastoid and middle ear opacification scalping at the petrous apex but no acute or interval erosion. The opacification has progressed. Negative nasopharynx. CT CERVICAL SPINE FINDINGS Alignment: No traumatic malalignment. Skull base and vertebrae: Negative for acute fracture Soft tissues and spinal canal: No prevertebral fluid or swelling. No visible canal hematoma. Disc levels: Lower cervical degenerative disc narrowing. Overall mild degenerative changes for age Upper chest: No acute finding IMPRESSION: 1. No evidence of acute intracranial or cervical spine  injury. 2. Right mastoid and middle ear opacification that has progressed from CT last year, please correlate with otoscopy. The nasopharynx is unremarkable. Electronically Signed   By: Monte Fantasia M.D.   On: 10/04/2018 05:31    Orson Eva, DO  [facility] Pager [phone]  If 7PM-7AM, please contact night-coverage [email or web address] Password [email or web address] 10/05/2018, 4:22 PM   LOS: 1 day

## 2018-10-05 NOTE — TOC Initial Note (Signed)
Transition of Care [facility]) - Initial/Assessment Note    Patient Details  Name: Abigail Taylor MRN: [id number] Date of Birth: 1931/05/04  [facility]) CM/SW Contact:    Ihor Gully, LCSW Phone Number: 10/05/2018, 3:02 PM  Clinical Narrative:                 Patient from home with spouse and dog. Spouse provided history. Patient admitted with Afib with RVR. At baseline patient is independent. She has DME in the home (cane, walker, lift equipment, shoe horn, BSC --due to previous hip surgery). She does not use any of the equipment currently. Patient has a rail that hooks to the bed so she can get up.  Spouse is agreeable to [facility] consult for ongoing CM.  [facility] consult placed.      Expected Discharge Plan: [place] Barriers to Discharge: Continued Medical Work up   Patient Goals and CMS Choice Patient states their goals for this hospitalization and ongoing recovery are:: return home.      Expected Discharge Plan and Services Expected Discharge Plan: [place]       Living arrangements for the past 2 months: [place] Agency: [facility] Date [place]: 10/05/18 Time HH Agency Contacted: 1501 Representative spoke with at [place]: consult placed  Prior Living Arrangements/Services Living arrangements for the past 2 months: [place] with:: Spouse Patient language and need for interpreter reviewed:: Yes Do you feel safe going back to the place where you live?: Yes      Need for Family Participation in Patient Care: Yes (Comment) Care giver support system in place?: Yes (comment) Current home services: DME Criminal Activity/Legal Involvement Pertinent to Current Situation/Hospitalization: No - Comment as needed  Activities of Daily Living Home Assistive Devices/Equipment: Cane (specify quad or straight), Walker (specify type) ADL Screening  (condition at time of admission) Patient's cognitive ability adequate to safely complete daily activities?: Yes Is the patient deaf or have difficulty hearing?: No Does the patient have difficulty seeing, even when wearing glasses/contacts?: No Does the patient have difficulty concentrating, remembering, or making decisions?: Yes Patient able to express need for assistance with ADLs?: Yes Does the patient have difficulty dressing or bathing?: No Independently performs ADLs?: Yes (appropriate for developmental age) Does the patient have difficulty walking or climbing stairs?: No Weakness of Legs: None Weakness of Arms/Hands: None  Permission Sought/Granted Permission sought to share information with : Family Supports          Permission granted to share info w Relationship: Spouse, Mr. Stuntz     Emotional Assessment Appearance:: Appears stated age   Affect (typically observed): Calm, Accepting Orientation: : Oriented to Self, Oriented to Place, Oriented to  Time, Oriented to Situation Alcohol / Substance Use: Not Applicable Psych Involvement: No (comment)  Admission diagnosis:  Hypokalemia [E87.6] Chronic anticoagulation [Z79.01] Atrial fibrillation with rapid ventricular response (HCC) [I48.91] Syncope, unspecified syncope type [R55] Atrial fibrillation with RVR ([place]) [I48.91] Patient Active Problem List   Diagnosis Date Noted  . Pressure injury of skin 10/05/2018  . Atrial fibrillation with rapid ventricular response ([place]) 10/04/2018  . Chronic anticoagulation   . Syncope   . Anticoagulated   . Epigastric pain   . Diverticulosis   . Melena  11/04/2017  . Atrial fibrillation with RVR ([place]) 09/29/2017  . RBD (REM behavioral disorder) 01/10/2017  . Neurogenic orthostatic hypotension ([place]) 01/10/2017  . Tachy-brady syndrome ([place]) 06/23/2016  . Closed subcapital fracture of left femur ([place]) 06/23/2016  . Orthostatic hypotension due to Parkinson's disease ([place]) 06/23/2016   . PD (Parkinson's disease) ([place]) 04/21/2016  . Hypokalemia 04/21/2016  . Normal coronary arteries 2001 05/08/2014  . Pacemaker 08/10/2012  . Aortic insufficiency 08/10/2012  . Constipation 12/27/2011  . Family hx of colon cancer 12/27/2011  . HTN (hypertension) 07/06/2011  . Hypothyroidism 07/05/2011  . Paroxysmal atrial fibrillation ([place]) 07/05/2011  . Sinus pause, 7 seconds post conversion, S/P MDT pacemaker 07/05/11 07/05/2011  . Dyslipidemia 07/05/2011  . Angina at rest, with rapid AF 07/05/2011   PCP:  [name], MD Pharmacy:   [id number] - [place], [address] [place] [address] Phone: [phone] Fax: [phone]     Social Determinants of Health (SDOH) Interventions    Readmission Risk Interventions No flowsheet data found.

## 2018-10-05 NOTE — Progress Notes (Signed)
Progress Note  Patient Name: Abigail Taylor Date of Encounter: 10/05/2018  Primary Cardiologist: [name], MD   Subjective   Resting comfortably. I spoke with husband in room.  Inpatient Medications    Scheduled Meds:  apixaban  2.5 mg Oral BID   carbidopa-levodopa  1 tablet Oral QHS   Carbidopa-Levodopa ER  1 tablet Oral TID AC   Chlorhexidine Gluconate Cloth  6 each Topical Daily   fludrocortisone  100 mcg Oral Daily   influenza vaccine adjuvanted  0.5 mL Intramuscular Tomorrow-1000   levothyroxine  75 mcg Oral QAC breakfast   Lifitegrast  1 drop Left Eye BID   linaclotide  145 mcg Oral Daily   pneumococcal 23 valent vaccine  0.5 mL Intramuscular Tomorrow-1000   rosuvastatin  10 mg Oral QHS   Continuous Infusions:  amiodarone 30 mg/hr (10/05/18 1009)   PRN Meds: acetaminophen, ondansetron **OR** ondansetron (ZOFRAN) IV, oxyCODONE   Vital Signs    Vitals:   10/05/18 0415 10/05/18 0430 10/05/18 0445 10/05/18 0500  BP:  (!) 152/73  140/72  Pulse: 65 62 62 72  Resp: 17 (!) 27 18 17   Temp:      TempSrc:      SpO2: 99% 98% 99% 94%  Weight:    53.6 kg  Height:        Intake/Output Summary (Last 24 hours) at 10/05/2018 1036 Last data filed at 10/05/2018 [id number] Gross per 24 hour  Intake 523.7 ml  Output 100 ml  Net 423.7 ml   Filed Weights   10/04/18 1155 10/05/18 0500  Weight: 53.6 kg 53.6 kg    Telemetry    Sinus rhythm, intermittent atrial pacing, PAC's - Personally Reviewed  ECG    NA - Personally Reviewed  Physical Exam   GEN: No acute distress. Frail. Neck: No JVD Cardiac: RRR, no murmurs, rubs, or gallops.  Respiratory: Clear to auscultation bilaterally. GI: Soft, nontender, non-distended  MS: No edema; No deformity. Neuro:  Nonfocal  Psych: Normal to flat affect   Labs    Chemistry Recent Labs  Lab 10/04/18 0428 10/05/18 0519  NA 141 142  K 2.9* 3.5  CL 107 108  CO2 25 23  GLUCOSE 117* 97  BUN 11 11    CREATININE 0.68 0.71  CALCIUM 8.3* 8.4*  GFRNONAA >60 >60  GFRAA >60 >60  ANIONGAP 9 11     Hematology Recent Labs  Lab 10/04/18 0428 10/05/18 0519  WBC 10.5 10.8*  RBC 4.78 4.76  HGB 13.6 13.7  HCT 43.1 43.5  MCV 90.2 91.4  MCH 28.5 28.8  MCHC 31.6 31.5  RDW 13.2 13.6  PLT 230 234    Cardiac EnzymesNo results for input(s): TROPONINI in the last 168 hours. No results for input(s): TROPIPOC in the last 168 hours.   BNPNo results for input(s): BNP, PROBNP in the last 168 hours.   DDimer No results for input(s): DDIMER in the last 168 hours.   Radiology    Ct Head Wo Contrast  Result Date: 10/04/2018 CLINICAL DATA:  Minor head trauma, on anticoagulation EXAM: CT HEAD WITHOUT CONTRAST CT CERVICAL SPINE WITHOUT CONTRAST TECHNIQUE: Multidetector CT imaging of the head and cervical spine was performed following the standard protocol without intravenous contrast. Multiplanar CT image reconstructions of the cervical spine were also generated. COMPARISON:  11/02/2017 FINDINGS: CT HEAD FINDINGS Brain: No evidence of acute infarction, hemorrhage, hydrocephalus, extra-axial collection or mass lesion/mass effect. Age normal brain volume. Vascular: No hyperdense vessel or unexpected  calcification. Skull: Negative for fracture Sinuses/Orbits: No evidence of injury. Bilateral cataract resection. Right mastoid and middle ear opacification scalping at the petrous apex but no acute or interval erosion. The opacification has progressed. Negative nasopharynx. CT CERVICAL SPINE FINDINGS Alignment: No traumatic malalignment. Skull base and vertebrae: Negative for acute fracture Soft tissues and spinal canal: No prevertebral fluid or swelling. No visible canal hematoma. Disc levels: Lower cervical degenerative disc narrowing. Overall mild degenerative changes for age Upper chest: No acute finding IMPRESSION: 1. No evidence of acute intracranial or cervical spine injury. 2. Right mastoid and middle ear  opacification that has progressed from CT last year, please correlate with otoscopy. The nasopharynx is unremarkable. Electronically Signed   By: Monte Fantasia M.D.   On: 10/04/2018 05:31   Ct Cervical Spine Wo Contrast  Result Date: 10/04/2018 CLINICAL DATA:  Minor head trauma, on anticoagulation EXAM: CT HEAD WITHOUT CONTRAST CT CERVICAL SPINE WITHOUT CONTRAST TECHNIQUE: Multidetector CT imaging of the head and cervical spine was performed following the standard protocol without intravenous contrast. Multiplanar CT image reconstructions of the cervical spine were also generated. COMPARISON:  11/02/2017 FINDINGS: CT HEAD FINDINGS Brain: No evidence of acute infarction, hemorrhage, hydrocephalus, extra-axial collection or mass lesion/mass effect. Age normal brain volume. Vascular: No hyperdense vessel or unexpected calcification. Skull: Negative for fracture Sinuses/Orbits: No evidence of injury. Bilateral cataract resection. Right mastoid and middle ear opacification scalping at the petrous apex but no acute or interval erosion. The opacification has progressed. Negative nasopharynx. CT CERVICAL SPINE FINDINGS Alignment: No traumatic malalignment. Skull base and vertebrae: Negative for acute fracture Soft tissues and spinal canal: No prevertebral fluid or swelling. No visible canal hematoma. Disc levels: Lower cervical degenerative disc narrowing. Overall mild degenerative changes for age Upper chest: No acute finding IMPRESSION: 1. No evidence of acute intracranial or cervical spine injury. 2. Right mastoid and middle ear opacification that has progressed from CT last year, please correlate with otoscopy. The nasopharynx is unremarkable. Electronically Signed   By: Monte Fantasia M.D.   On: 10/04/2018 05:31    Cardiac Studies    Echocardiogram 10/04/18:   1. The left ventricle has normal systolic function with an ejection fraction of 60-65%. The cavity size was normal. There is moderately increased  left ventricular wall thickness. Left ventricular diastolic Doppler parameters are consistent with  pseudonormalization. Elevated mean left atrial pressure.  2. The right ventricle has normal systolic function. The cavity was normal. There is no increase in right ventricular wall thickness.  3. Left atrial size was severely dilated.  4. Right atrial size was mildly dilated.  5. No evidence of mitral valve stenosis.  6. Tricuspid valve regurgitation is mild-moderate.  7. The aortic valve is tricuspid. Mild thickening of the aortic valve. Mild calcification of the aortic valve. Aortic valve regurgitation is mild to moderate by color flow Doppler. No stenosis of the aortic valve.  8. The aorta is normal unless otherwise noted.  9. The aortic root is normal in size and structure. 10. Pulmonary hypertension is moderately elevated, PASP is 45 mmHg. 11. The inferior vena cava was dilated in size with >50% respiratory variability.  Patient Profile     85 y.o. female with a hx of sinus node dysfunction, PAF and persistent a fib, Tachy-Brady syndrome with PPM [employer]pta, normal cors on angiogram in 2001 and normal nuc 2013, orthostatic hypotension, related to Parkinson's syndrome now admitted who is being seen today for the evaluation of syncope at the request of  Dr. Carles Collet.  Assessment & Plan    1. Rapid atrial fibrillation: Back in sinus rhythm today. I will stop IV amiodarone and resume oral 200 mg daily. K low normal so will give one dose of 40 meq. Continue apixaban 2.5 mg bid. Has not tolerated AV nodal blocking agents due to severe hypotension and near syncope in past.  2. Near syncope/syncope: Due to orthostatic hypotension. On Florinef. Rapid atrial fibrillation likely contributed. Now in sinus rhythm.  3. Hypokalemia: K low normal so will give one dose of 40 meq.  4. PPM: Functioning normally.  5. Parkinson's disease: On carbidopa-levodopa.     [facility] will sign off.     Medication Recommendations:  As above Other recommendations (labs, testing, etc):  NA Follow up as an outpatient:  Will arrange  For questions or updates, please contact [place] Please consult [email or web address] for contact info under Cardiology/STEMI.      Signed, Kate Sable, MD  10/05/2018, 10:36 AM

## 2018-10-06 LAB — BASIC METABOLIC PANEL
Anion gap: 9 (ref 5–15)
BUN: 13 mg/dL (ref 8–23)
CO2: 26 mmol/L (ref 22–32)
Calcium: 8.6 mg/dL — ABNORMAL LOW (ref 8.9–10.3)
Chloride: 107 mmol/L (ref 98–111)
Creatinine, Ser: 0.64 mg/dL (ref 0.44–1.00)
GFR calc Af Amer: >60 mL/min (ref >60)
GFR calc non Af Amer: >60 mL/min (ref >60)
Glucose, Bld: 92 mg/dL (ref 70–99)
Potassium: 3.7 mmol/L (ref 3.5–5.1)
Sodium: 142 mmol/L (ref 135–145)

## 2018-10-06 LAB — MAGNESIUM: Magnesium: 2 mg/dL (ref 1.7–2.4)

## 2018-10-06 LAB — TSH: TSH: 2.886 u[IU]/mL (ref 0.350–4.500)

## 2018-10-06 LAB — T4, FREE: Free T4: 1.69 ng/dL — ABNORMAL HIGH (ref 0.61–1.12)

## 2018-10-06 MED ORDER — HYDRALAZINE HCL 20 MG/ML IJ SOLN: 5.0000 mg | Freq: Four times a day (QID) | INTRAMUSCULAR | Status: DC | PRN

## 2018-10-06 MED ORDER — HYDRALAZINE HCL 20 MG/ML IJ SOLN
10.0000 mg | Freq: Once | INTRAMUSCULAR | Status: AC
  Administered 2018-10-06: 10 mg via INTRAVENOUS
  Filled 2018-10-06: qty 1

## 2018-10-06 MED ORDER — POTASSIUM CHLORIDE IN NACL 20-0.9 MEQ/L-% IV SOLN
INTRAVENOUS | Status: AC
  Administered 2018-10-06 – 2018-10-07 (×2): via INTRAVENOUS

## 2018-10-06 NOTE — Plan of Care (Signed)
  Problem: Acute Rehab PT Goals(only PT should resolve) Goal: Pt Will Go Supine/Side To Sit Outcome: Progressing Flowsheets (Taken 10/06/2018 1103) Pt will go Supine/Side to Sit:  with min guard assist  with minimal assist Goal: Patient Will Transfer Sit To/From Stand Outcome: Progressing Flowsheets (Taken 10/06/2018 1103) Patient will transfer sit to/from stand:  with min guard assist  with minimal assist Goal: Pt Will Transfer Bed To Chair/Chair To Bed Outcome: Progressing Flowsheets (Taken 10/06/2018 1103) Pt will Transfer Bed to Chair/Chair to Bed:  min guard assist  with min assist Goal: Pt Will Ambulate Outcome: Progressing Flowsheets (Taken 10/06/2018 1103) Pt will Ambulate:  50 feet  with minimal assist  with rolling walker   11:03 AM, 10/06/18 Lonell Grandchild, MPT Physical Therapist with [facility] [phone] office [phone] mobile phone

## 2018-10-06 NOTE — Progress Notes (Signed)
PROGRESS NOTE  Abigail Taylor [id number] DOB: February 02, 1931 DOA: 10/04/2018 PCP: [name], MD  Brief History:  85 y.o.femalewith medical history oftachybradycardia syndromestatus post PPM 2012, paroxysmal atrial fibrillation on apixaban, Parkinson's disease, orthostatic hypotension, hyperlipidemia, hypothyroidism presenting witha syncopal episode. The patient was getting up out of bed in the early morning 10/04/2018 when she felt dizzy and fell to the ground. She felt like she may have hit her head on the dresser on the way down. She states that there was no bladder or bowel incontinence, nor did she bite her tongue. The patient does have a history of orthostatic hypotension secondary to her Parkinson's disease for which she takes Florinef. However, the patient states that she has felt her heart racing intermittently for the past week prior to admission. She states that she stopped taking amiodarone approximately 2 years ago because she was concerned about the side effects particularly in the setting of her Parkinson's disease. Nevertheless, the patient denies any recent fever, chills, chest pain, shortness of breath, headache, nausea, vomiting, diarrhea, abdominal pain, dysuria, hematuria. She denies any other new medications. She has not had any other recent syncopal episodes. She her last follow-up with her cardiologist, Dr. Sallyanne Taylor in Jan 2020. In the emergency department, the patient was afebrile and hemodynamically stable with oxygen saturation 96% room air. BMP showed low potassium 2.9. CBC was unremarkable with her hemoglobin at baseline. EKG showed atrial fibrillation with RVR with right bundle branch block. CT of the brain and cervical spine were negative.   Assessment/Plan:  Atrial fibrillation with RVR--paroxsymal -Continue amiodarone drip -Cardiology consult--started on IV amio-->converted to NSR -transition to po amiodarone-->no further RVR  -Check TSH--pending -Echocardiogram--EF 60-65%, mild-mod TR/AI -Continue apixaban -CHADS-VASc - 3 -try to keep K>4 and mag >2 -Has not tolerated AV nodal blocking agents due to severe hypotension and near syncope in past.   Syncope -Multifactorial including orthostatic hypotension and possibly atrial fibrillation -Check orthostatics--neg -Remain on telemetry -Echo as discussed above -9/12--pt had another near syncopal episode due to orthostasis with drop in SBP into 70s -hold any prn anti-HTN meds unless SBP>190 (she received hydralazine earlier in day) -9/12--restart IVF  Tachybradycardia syndrome -Consult cardiology--started on IV amio-->converted to NSR -transition to po amiodarone without further tachycardia -Status post PPM 2012 -Has not tolerated AV nodal blocking agents due to severe hypotension and near syncope in past.  Parkinson's disease with orthostatic hypotension -Continue Sinemet -willneed to tolerate higher blood pressure at baseline -follow up with Dr. Wells Guiles Rashema Taylor -PT eval  Constipation -will need daily bowel regimen after d/c -daily miralax  Hypothyroidism -Continue Synthroid -TSH--2.886  Hypokalemia -Replete -check mag--2.0  Hyperlipidemia -Continue statin  Stage 2 sacral ulcer -present on admission -not infected on exam -continue local care      Disposition Plan:   Home 9/13 if stable Family Communication: spouse updated at bedside 9/12  Consultants:  cardiology  Code Status:  FULL   DVT Prophylaxis:  apixaban   Procedures: As Listed in Progress Note Above  Antibiotics: None    Subjective: Pt had near syncope with PT and standing with BP drop into 70s.  Denies f,c, cp, sob, headache, n/v/d  Objective: Vitals:   10/05/18 2109 10/06/18 0457 10/06/18 1427 10/06/18 1622  BP: (!) 167/75 (!) 187/77 (!) 117/57 (!) 109/54  Pulse: 70 60 (!) 56 (!) 59  Resp: 18 16 16 16   Temp: 98.2 F (36.8 C) 97.7 F (36.5  C) 97.8 F (  36.6 C) 98 F (36.7 C)  TempSrc: Oral Oral Oral Oral  SpO2: 98% 100% 97% 98%  Weight:      Height:        Intake/Output Summary (Last 24 hours) at 10/06/2018 1729 Last data filed at 10/06/2018 1258 Gross per 24 hour  Intake 480 ml  Output 1050 ml  Net -570 ml   Weight change:  Exam:   General:  Pt is alert, follows commands appropriately, not in acute distress  HEENT: No icterus, No thrush, No neck mass, [place]/AT  Cardiovascular: IRRR, S1/S2, no rubs, no gallops  Respiratory: CTA bilaterally, no wheezing, no crackles, no rhonchi  Abdomen: Soft/+BS, non tender, non distended, no guarding  Extremities: No edema, No lymphangitis, No petechiae, No rashes, no synovitis   Data Reviewed: I have personally reviewed following labs and imaging studies Basic Metabolic Panel: Recent Labs  Lab 10/04/18 0428 10/05/18 0519 10/06/18 0636  NA 141 142 142  K 2.9* 3.5 3.7  CL 107 108 107  CO2 25 23 26   GLUCOSE 117* 97 92  BUN 11 11 13   CREATININE 0.68 0.71 0.64  CALCIUM 8.3* 8.4* 8.6*  MG 1.9 1.8 2.0   Liver Function Tests: No results for input(s): AST, ALT, ALKPHOS, BILITOT, PROT, ALBUMIN in the last 168 hours. No results for input(s): LIPASE, AMYLASE in the last 168 hours. No results for input(s): AMMONIA in the last 168 hours. Coagulation Profile: No results for input(s): INR, PROTIME in the last 168 hours. CBC: Recent Labs  Lab 10/04/18 0428 10/05/18 0519  WBC 10.5 10.8*  NEUTROABS 7.9*  --   HGB 13.6 13.7  HCT 43.1 43.5  MCV 90.2 91.4  PLT 230 234   Cardiac Enzymes: No results for input(s): CKTOTAL, CKMB, CKMBINDEX, TROPONINI in the last 168 hours. BNP: Invalid input(s): POCBNP CBG: No results for input(s): GLUCAP in the last 168 hours. HbA1C: No results for input(s): HGBA1C in the last 72 hours. Urine analysis:    Component Value Date/Time   COLORURINE YELLOW 10/04/2018 0903   APPEARANCEUR HAZY (A) 10/04/2018 0903   LABSPEC 1.024 10/04/2018  0903   PHURINE 5.0 10/04/2018 0903   GLUCOSEU NEGATIVE 10/04/2018 0903   HGBUR NEGATIVE 10/04/2018 0903   BILIRUBINUR NEGATIVE 10/04/2018 0903   KETONESUR 5 (A) 10/04/2018 0903   PROTEINUR NEGATIVE 10/04/2018 0903   UROBILINOGEN 0.2 05/07/2014 2101   NITRITE NEGATIVE 10/04/2018 0903   LEUKOCYTESUR NEGATIVE 10/04/2018 0903   Sepsis Labs: @LABRCNTIP (procalcitonin:4,lacticidven:4) ) Recent Results (from the past 240 hour(s))  SARS Coronavirus 2 [facility] order, Performed in [facility] hospital lab) Nasopharyngeal Nasopharyngeal Swab     Status: None   Collection Time: 10/04/18  3:18 AM   Specimen: Nasopharyngeal Swab  Result Value Ref Range Status   SARS Coronavirus 2 NEGATIVE NEGATIVE Final    Comment: (NOTE) If result is NEGATIVE SARS-CoV-2 target nucleic acids are NOT DETECTED. The SARS-CoV-2 RNA is generally detectable in upper and lower  respiratory specimens during the acute phase of infection. The lowest  concentration of SARS-CoV-2 viral copies this assay can detect is 250  copies / mL. A negative result does not preclude SARS-CoV-2 infection  and should not be used as the sole basis for treatment or other  patient management decisions.  A negative result may occur with  improper specimen collection / handling, submission of specimen other  than nasopharyngeal swab, presence of viral mutation(s) within the  areas targeted by this assay, and inadequate number of viral copies  (<250 copies /  mL). A negative result must be combined with clinical  observations, patient history, and epidemiological information. If result is POSITIVE SARS-CoV-2 target nucleic acids are DETECTED. The SARS-CoV-2 RNA is generally detectable in upper and lower  respiratory specimens dur ing the acute phase of infection.  Positive  results are indicative of active infection with SARS-CoV-2.  Clinical  correlation with patient history and other diagnostic information is  necessary to determine  patient infection status.  Positive results do  not rule out bacterial infection or co-infection with other viruses. If result is PRESUMPTIVE POSTIVE SARS-CoV-2 nucleic acids MAY BE PRESENT.   A presumptive positive result was obtained on the submitted specimen  and confirmed on repeat testing.  While 2019 novel coronavirus  (SARS-CoV-2) nucleic acids may be present in the submitted sample  additional confirmatory testing may be necessary for epidemiological  and / or clinical management purposes  to differentiate between  SARS-CoV-2 and other Sarbecovirus currently known to infect humans.  If clinically indicated additional testing with an alternate test  methodology [phone]) is advised. The SARS-CoV-2 RNA is generally  detectable in upper and lower respiratory sp ecimens during the acute  phase of infection. The expected result is Negative. Fact Sheet for Patients:  StrictlyIdeas.no Fact Sheet for Healthcare Providers: BankingDealers.co.za This test is not yet approved or cleared by the [place] FDA and has been authorized for detection and/or diagnosis of SARS-CoV-2 by FDA under an Emergency Use Authorization (EUA).  This EUA will remain in effect (meaning this test can be used) for the duration of the COVID-19 declaration under Section 564(b)(1) of the Act, 21 U.S.C. section 360bbb-3(b)(1), unless the authorization is terminated or revoked sooner. Performed at [facility], [address], [place], [place] [address]   Culture, Urine     Status: Abnormal   Collection Time: 10/04/18  9:03 AM   Specimen: Urine, Clean Catch  Result Value Ref Range Status   Specimen Description   Final    URINE, CLEAN CATCH Performed at [facility], [address] [place], [place] [address]    Special Requests   Final    NONE Performed at [facility], [address] [place], [place] [address]    Culture (A)  Final    <10,000 COLONIES/mL  INSIGNIFICANT GROWTH Performed at [facility] [address] [place], [place] [address]    Report Status 10/05/2018 FINAL  Final  MRSA PCR Screening     Status: None   Collection Time: 10/04/18 11:35 AM   Specimen: Nasopharyngeal  Result Value Ref Range Status   MRSA by PCR NEGATIVE NEGATIVE Final    Comment:        The GeneXpert MRSA Assay (FDA approved for NASAL specimens only), is one component of a comprehensive MRSA colonization surveillance program. It is not intended to diagnose MRSA infection nor to guide or monitor treatment for MRSA infections. Performed at [facility], [address] [place], [place] [address]      Scheduled Meds: . amiodarone  200 mg Oral Daily  . apixaban  2.5 mg Oral BID  . carbidopa-levodopa  1 tablet Oral QHS  . Carbidopa-Levodopa ER  1 tablet Oral TID AC  . Chlorhexidine Gluconate Cloth  6 each Topical Daily  . feeding supplement (ENSURE ENLIVE)  237 mL Oral BID BM  . fludrocortisone  100 mcg Oral Daily  . levothyroxine  75 mcg Oral QAC breakfast  . Lifitegrast  1 drop Left Eye BID  . linaclotide  145 mcg Oral Daily  . nutrition supplement (JUVEN)  1 packet Oral BID BM  . rosuvastatin  10 mg Oral QHS   Continuous Infusions: . 0.9 % NaCl with KCl 20 mEq / L 75 mL/hr at 10/06/18 1646    Procedures/Studies: Ct Head Wo Contrast  Result Date: 10/04/2018 CLINICAL DATA:  Minor head trauma, on anticoagulation EXAM: CT HEAD WITHOUT CONTRAST CT CERVICAL SPINE WITHOUT CONTRAST TECHNIQUE: Multidetector CT imaging of the head and cervical spine was performed following the standard protocol without intravenous contrast. Multiplanar CT image reconstructions of the cervical spine were also generated. COMPARISON:  11/02/2017 FINDINGS: CT HEAD FINDINGS Brain: No evidence of acute infarction, hemorrhage, hydrocephalus, extra-axial collection or mass lesion/mass effect. Age normal brain volume. Vascular: No hyperdense vessel or unexpected  calcification. Skull: Negative for fracture Sinuses/Orbits: No evidence of injury. Bilateral cataract resection. Right mastoid and middle ear opacification scalping at the petrous apex but no acute or interval erosion. The opacification has progressed. Negative nasopharynx. CT CERVICAL SPINE FINDINGS Alignment: No traumatic malalignment. Skull base and vertebrae: Negative for acute fracture Soft tissues and spinal canal: No prevertebral fluid or swelling. No visible canal hematoma. Disc levels: Lower cervical degenerative disc narrowing. Overall mild degenerative changes for age Upper chest: No acute finding IMPRESSION: 1. No evidence of acute intracranial or cervical spine injury. 2. Right mastoid and middle ear opacification that has progressed from CT last year, please correlate with otoscopy. The nasopharynx is unremarkable. Electronically Signed   By: Monte Fantasia M.D.   On: 10/04/2018 05:31   Ct Cervical Spine Wo Contrast  Result Date: 10/04/2018 CLINICAL DATA:  Minor head trauma, on anticoagulation EXAM: CT HEAD WITHOUT CONTRAST CT CERVICAL SPINE WITHOUT CONTRAST TECHNIQUE: Multidetector CT imaging of the head and cervical spine was performed following the standard protocol without intravenous contrast. Multiplanar CT image reconstructions of the cervical spine were also generated. COMPARISON:  11/02/2017 FINDINGS: CT HEAD FINDINGS Brain: No evidence of acute infarction, hemorrhage, hydrocephalus, extra-axial collection or mass lesion/mass effect. Age normal brain volume. Vascular: No hyperdense vessel or unexpected calcification. Skull: Negative for fracture Sinuses/Orbits: No evidence of injury. Bilateral cataract resection. Right mastoid and middle ear opacification scalping at the petrous apex but no acute or interval erosion. The opacification has progressed. Negative nasopharynx. CT CERVICAL SPINE FINDINGS Alignment: No traumatic malalignment. Skull base and vertebrae: Negative for acute fracture  Soft tissues and spinal canal: No prevertebral fluid or swelling. No visible canal hematoma. Disc levels: Lower cervical degenerative disc narrowing. Overall mild degenerative changes for age Upper chest: No acute finding IMPRESSION: 1. No evidence of acute intracranial or cervical spine injury. 2. Right mastoid and middle ear opacification that has progressed from CT last year, please correlate with otoscopy. The nasopharynx is unremarkable. Electronically Signed   By: Monte Fantasia M.D.   On: 10/04/2018 05:31    Orson Eva, DO  [facility] Pager [phone]  If 7PM-7AM, please contact night-coverage [email or web address] Password [email or web address] 10/06/2018, 5:29 PM   LOS: 2 days

## 2018-10-06 NOTE — Evaluation (Addendum)
Physical Therapy Evaluation Patient Details Name: [name] MRN: [id number] DOB: [birth year] Today's Date: 10/06/2018   History of Present Illness  Abigail Taylor is a 85 y.o. female with medical history of  tachybradycardia syndrome status post PPM 2012, paroxysmal atrial fibrillation on apixaban, Parkinson's disease, orthostatic hypotension, hyperlipidemia, hypothyroidism presenting with a syncopal episode.  The patient was getting up out of bed in the early morning 10/04/2018 when she felt dizzy and fell to the ground.  She felt like she may have hit her head on the dresser on the way down.  She states that there was no bladder or bowel incontinence, nor did she bite her tongue.  The patient does have a history of orthostatic hypotension secondary to her Parkinson's disease for which she takes Florinef.  However, the patient states that she has felt her heart racing intermittently for the past week prior to admission.  She states that she stopped taking amiodarone approximately 2 years ago because she was concerned about the side effects particularly in the setting of her Parkinson's disease.    Clinical Impression  Patient demonstrates slow labored movement for turning in bed and during supine to sitting, initially stood with Min hand held assist, safer using RW during transfer to chair, sat up in chair for a couple of minutes prior to attempting gait training, after 5-6 steps patient had near syncopal episode, had to sit down and BP found to be at 75/41.   Patient's orthostatics were taken as follows: supine 83/43, sitting 82/44, standing 70/46, SpO2 97% and HR normal between 70-90 bpm.  Patient left in bed with spouse seated at bedside - RN notified concerning BP.  Patient will benefit from continued physical therapy in hospital and recommended venue below to increase strength, balance, endurance for safe ADLs and gait.    Follow Up Recommendations SNF;Supervision for mobility/OOB;Supervision -  Intermittent    Equipment Recommendations  None recommended by PT    Recommendations for Other Services       Precautions / Restrictions Precautions Precautions: Fall Restrictions Weight Bearing Restrictions: No      Mobility  Bed Mobility Overal bed mobility: Needs Assistance Bed Mobility: Supine to Sit     Supine to sit: Min assist     General bed mobility comments: uses overhead trapeze at home, required assistance to pull self up to sitting  Transfers Overall transfer level: Needs assistance Equipment used: None;Rolling walker (2 wheeled);1 person hand held assist Transfers: Sit to/from [facility] Sit to Stand: Min assist Stand pivot transfers: Min assist;Mod assist       General transfer comment: unable to stand without AD due to weakness  Ambulation/Gait Ambulation/Gait assistance: Mod assist Gait Distance (Feet): 5 Feet Assistive device: Rolling walker (2 wheeled) Gait Pattern/deviations: Decreased step length - right;Decreased step length - left;Decreased stride length Gait velocity: slow   General Gait Details: limited to 5-6 slow unsteady steps at bedside due to c/o fatigue with near syncopal episode due to BP dropping  Stairs            Wheelchair Mobility    Modified Rankin (Stroke Patients Only)       Balance Overall balance assessment: Needs assistance Sitting-balance support: Feet supported;No upper extremity supported Sitting balance-Leahy Scale: Fair Sitting balance - Comments: seated at bedside   Standing balance support: During functional activity;No upper extremity supported Standing balance-Leahy Scale: Poor Standing balance comment: fair using RW  Pertinent Vitals/Pain Pain Assessment: No/denies pain    Home Living Family/patient expects to be discharged to:: Private residence Living Arrangements: Spouse/significant other Available Help at Discharge:  Family;Available PRN/intermittently Type of Home: House Home Access: Stairs to enter Entrance Stairs-Rails: None Entrance Stairs-Number of Steps: 1 Home Layout: One level Home Equipment: Cane - single point;Walker - 2 wheels;Shower seat;Bedside commode      Prior Function Level of Independence: Needs assistance   Gait / Transfers Assistance Needed: household ambulator without AD  ADL's / Homemaking Assistance Needed: assisted by spouse        Hand Dominance        Extremity/Trunk Assessment   Upper Extremity Assessment Upper Extremity Assessment: Generalized weakness    Lower Extremity Assessment Lower Extremity Assessment: Generalized weakness    Cervical / Trunk Assessment Cervical / Trunk Assessment: Normal  Communication   Communication: No difficulties  Cognition Arousal/Alertness: Awake/alert Behavior During Therapy: WFL for tasks assessed/performed Overall Cognitive Status: Within Functional Limits for tasks assessed                                        General Comments      Exercises     Assessment/Plan    PT Assessment Patient needs continued PT services  PT Problem List Decreased strength;Decreased activity tolerance;Decreased balance;Decreased mobility       PT Treatment Interventions Balance training;Gait training;Stair training;Functional mobility training;Therapeutic activities;Therapeutic exercise;Patient/family education    PT Goals (Current goals can be found in the Care Plan section)  Acute Rehab PT Goals Patient Stated Goal: return home with spouse to assist PT Goal Formulation: With patient/family Time For Goal Achievement: 10/20/18 Potential to Achieve Goals: Good    Frequency Min 3X/week   Barriers to discharge        Co-evaluation               AM-PAC PT "6 Clicks" Mobility  Outcome Measure Help needed turning from your back to your side while in a flat bed without using bedrails?: A Little Help  needed moving from lying on your back to sitting on the side of a flat bed without using bedrails?: A Little Help needed moving to and from a bed to a chair (including a wheelchair)?: A Lot Help needed standing up from a chair using your arms (e.g., wheelchair or bedside chair)?: A Lot Help needed to walk in hospital room?: A Lot Help needed climbing 3-5 steps with a railing? : A Lot 6 Click Score: 14    End of Session   Activity Tolerance: Patient tolerated treatment well;Patient limited by fatigue;Other (comment)(near syncopal episode due to BP dropping) Patient left: in bed;with call bell/phone within reach;with bed alarm set;with family/visitor present Nurse Communication: Mobility status PT Visit Diagnosis: Unsteadiness on feet (R26.81);Other abnormalities of gait and mobility (R26.89);Muscle weakness (generalized) (M62.81)    Time: [id number] PT Time Calculation (min) (ACUTE ONLY): 40 min   Charges:   PT Evaluation $PT Eval High Complexity: 1 High PT Treatments $Therapeutic Activity: 38-52 mins        10:59 AM, 10/06/18 Lonell Grandchild, MPT Physical Therapist with [facility] [phone] office [phone] mobile phone

## 2018-10-07 LAB — CBC
HCT: 41 % (ref 36.0–46.0)
Hemoglobin: 13.3 g/dL (ref 12.0–15.0)
MCH: 28.7 pg (ref 26.0–34.0)
MCHC: 32.4 g/dL (ref 30.0–36.0)
MCV: 88.4 fL (ref 80.0–100.0)
Platelets: 232 10*3/uL (ref 150–400)
RBC: 4.64 MIL/uL (ref 3.87–5.11)
RDW: 13.3 % (ref 11.5–15.5)
WBC: 8 10*3/uL (ref 4.0–10.5)
nRBC: 0 % (ref 0.0–0.2)

## 2018-10-07 LAB — BASIC METABOLIC PANEL
Anion gap: 5 (ref 5–15)
BUN: 15 mg/dL (ref 8–23)
CO2: 26 mmol/L (ref 22–32)
Calcium: 8.1 mg/dL — ABNORMAL LOW (ref 8.9–10.3)
Chloride: 110 mmol/L (ref 98–111)
Creatinine, Ser: 0.68 mg/dL (ref 0.44–1.00)
GFR calc Af Amer: >60 mL/min (ref >60)
GFR calc non Af Amer: >60 mL/min (ref >60)
Glucose, Bld: 94 mg/dL (ref 70–99)
Potassium: 4 mmol/L (ref 3.5–5.1)
Sodium: 141 mmol/L (ref 135–145)

## 2018-10-07 LAB — MAGNESIUM: Magnesium: 1.7 mg/dL (ref 1.7–2.4)

## 2018-10-07 MED ORDER — AMIODARONE HCL 200 MG PO TABS: 200.0000 mg | ORAL_TABLET | Freq: Every day | ORAL | Status: DC

## 2018-10-07 MED ORDER — CYCLOSPORINE 0.05 % OP EMUL
1.0000 [drp] | Freq: Two times a day (BID) | OPHTHALMIC | Status: DC
  Administered 2018-10-07: 1 [drp] via OPHTHALMIC
  Filled 2018-10-07: qty 30

## 2018-10-07 NOTE — Progress Notes (Signed)
IVs removed, 2x2 gauze and paper tape applied to site, patient tolerated well. Reviewed AVS with patient's husband who verbalized understanding. Patient taken to lobby via wheelchair and transported home by her husband.

## 2018-10-07 NOTE — Discharge Summary (Addendum)
Physician Discharge Summary  Abigail Taylor [id number] DOB: [birth year] DOA: 10/04/2018  PCP: Abigail School, MD  Admit date: 10/04/2018 Discharge date: 10/07/2018  Admitted From: Home Disposition:  Home   Recommendations for Outpatient Follow-up:  1. Follow up with PCP in 1-2 weeks 2. Please obtain BMP/CBC in one week   Home Health: HHPT  Discharge Condition: Stable CODE STATUS: FULL Diet recommendation: Regular    Brief/Interim Summary: 85 y.o.femalewith medical history oftachybradycardia syndromestatus post PPM 2012, paroxysmal atrial fibrillation on apixaban, Parkinson's disease, orthostatic hypotension, hyperlipidemia, hypothyroidism presenting witha syncopal episode. The patient was getting up out of bed in the early morning 10/04/2018 when she felt dizzy and fell to the ground. She felt like she may have hit her head on the dresser on the way down. She states that there was no bladder or bowel incontinence, nor did she bite her tongue. The patient does have a history of orthostatic hypotension secondary to her Parkinson's disease for which she takes Florinef. However, the patient states that she has felt her heart racing intermittently for the past week prior to admission. She states that she stopped taking amiodarone approximately 2 years ago because she was concerned about the side effects particularly in the setting of her Parkinson's disease. Nevertheless, the patient denies any recent fever, chills, chest pain, shortness of breath, headache, nausea, vomiting, diarrhea, abdominal pain, dysuria, hematuria. She denies any other new medications. She has not had any other recent syncopal episodes. She her last follow-up with her cardiologist, Dr. Sallyanne Taylor in Jan 2020. In the emergency department, the patient was afebrile and hemodynamically stable with oxygen saturation 96% room air. BMP showed low potassium 2.9. CBC was unremarkable with her hemoglobin at  baseline. EKG showed atrial fibrillation with RVR with right bundle branch block. CT of the brain and cervical spine were negative. Pt initially started on IV amiodarone and converted to sinus.  She was transitioned to po amiodarone and remained in sinus.  She was allowed to have permissive hypertension due to her hx of having severe orthostatic hypotension with any type of anti-HTN meds.  Therefore, no anti-HTN meds should be given unless her SBP >190 consistently.  PT recommended SNF which the patient refused.  Her spouse provides 24/7 care and was willing to take patient home.  Discharge Diagnoses:  Atrial fibrillation with RVR--paroxsymal -Continue amiodarone drip -Cardiology consult--started on IV amio-->converted to NSR -transition to po amiodarone-->no further RVR -Check TSH--pending -Echocardiogram--EF 60-65%, mild-mod TR/AI -Continue apixaban -CHADS-VASc - 3 -try to keep K>4 and mag >2 -Has not tolerated AV nodal blocking agents due to severe hypotension and near syncope in past.  Syncope -Multifactorial including orthostatic hypotension and possibly atrial fibrillation -Check orthostatics--neg -Remain on telemetry--no further dysrhythmia -Echo as discussed above -9/12--pt had another near syncopal episode due to orthostasis with drop in SBP into 70s due to patient receiving prn hydralazine IV -hold any prn anti-HTN meds unless SBP>190 (she received hydralazine earlier in day) -9/12--restart IVF -9/13--clinically improved without any further hypotension; able to ambulate without dizziness  Tachybradycardia syndrome -Consult cardiology--started on IV amio-->converted to NSR -transition to po amiodarone without further tachycardia -Status post PPM 2012 -Has not tolerated AV nodal blocking agents due to severe hypotension and near syncope in past.  Parkinson's disease with orthostatic hypotension -Continue Sinemet -willneed to tolerate higher blood pressure at  baseline -follow up with Dr. Wells Guiles Nirvaan Taylor -PT eval-->HHPT vs SNF; pt refuses SNF  Constipation -will need daily bowel regimen after d/c -daily miralax  Hypothyroidism -  Continue Synthroid -TSH--2.886  Hypokalemia -Replete -check mag--2.0  Hyperlipidemia -Continue statin  Stage 2 sacral ulcer -present on admission -not infected on exam -continue local care   Discharge Instructions   Allergies as of 10/07/2018   No Known Allergies     Medication List    TAKE these medications   acetaminophen 500 MG tablet Commonly known as: TYLENOL Take 1,000 mg by mouth every 6 (six) hours as needed for mild pain.   amiodarone 200 MG tablet Commonly known as: PACERONE Take 1 tablet (200 mg total) by mouth daily. What changed: when to take this   apixaban 2.5 MG Tabs tablet Commonly known as: Eliquis Take 1 tablet (2.5 mg total) by mouth 2 (two) times daily.   Carbidopa-Levodopa ER 25-100 MG tablet controlled release Commonly known as: SINEMET CR 2 at 7am/2 at 11am/1 at 4pm What changed:   how much to take  how to take this  when to take this  additional instructions   carbidopa-levodopa 50-200 MG tablet Commonly known as: SINEMET CR TAKE 1 TABLET BY MOUTH EVERYDAY AT BEDTIME What changed: See the new instructions.   fludrocortisone 0.1 MG tablet Commonly known as: FLORINEF TAKE 1 TABLET BY MOUTH EVERY DAY   levothyroxine 75 MCG tablet Commonly known as: SYNTHROID Take 75 mcg by mouth daily before breakfast.   Linzess 145 MCG Caps capsule Generic drug: linaclotide Take 145 mcg by mouth daily.   rosuvastatin 10 MG tablet Commonly known as: CRESTOR Take 10 mg by mouth at bedtime. Reported on 07/09/2015   traMADol 50 MG tablet Commonly known as: ULTRAM Take 50 mg by mouth every 6 (six) hours as needed for moderate pain.   Xiidra 5 % Soln Generic drug: Lifitegrast Place 1 drop into the left eye 2 (two) times daily.      Follow-up Information     Taylor, Mihai, MD Follow up on 10/23/2018.   Specialty: Cardiology Why: at 10:45 AM with his Abigail Taylor, [place] Contact information: [address] [place] [address] [phone]          No Known Allergies  Consultations:  cardiology   Procedures/Studies: Ct Head Wo Contrast  Result Date: 10/04/2018 CLINICAL DATA:  Minor head trauma, on anticoagulation EXAM: CT HEAD WITHOUT CONTRAST CT CERVICAL SPINE WITHOUT CONTRAST TECHNIQUE: Multidetector CT imaging of the head and cervical spine was performed following the standard protocol without intravenous contrast. Multiplanar CT image reconstructions of the cervical spine were also generated. COMPARISON:  11/02/2017 FINDINGS: CT HEAD FINDINGS Brain: No evidence of acute infarction, hemorrhage, hydrocephalus, extra-axial collection or mass lesion/mass effect. Age normal brain volume. Vascular: No hyperdense vessel or unexpected calcification. Skull: Negative for fracture Sinuses/Orbits: No evidence of injury. Bilateral cataract resection. Right mastoid and middle ear opacification scalping at the petrous apex but no acute or interval erosion. The opacification has progressed. Negative nasopharynx. CT CERVICAL SPINE FINDINGS Alignment: No traumatic malalignment. Skull base and vertebrae: Negative for acute fracture Soft tissues and spinal canal: No prevertebral fluid or swelling. No visible canal hematoma. Disc levels: Lower cervical degenerative disc narrowing. Overall mild degenerative changes for age Upper chest: No acute finding IMPRESSION: 1. No evidence of acute intracranial or cervical spine injury. 2. Right mastoid and middle ear opacification that has progressed from CT last year, please correlate with otoscopy. The nasopharynx is unremarkable. Electronically Signed   By: Monte Fantasia M.D.   On: 10/04/2018 05:31   Ct Cervical Spine Wo Contrast  Result Date: 10/04/2018 CLINICAL DATA:  Minor head trauma, on anticoagulation  EXAM: CT HEAD WITHOUT CONTRAST CT CERVICAL SPINE WITHOUT CONTRAST TECHNIQUE: Multidetector CT imaging of the head and cervical spine was performed following the standard protocol without intravenous contrast. Multiplanar CT image reconstructions of the cervical spine were also generated. COMPARISON:  11/02/2017 FINDINGS: CT HEAD FINDINGS Brain: No evidence of acute infarction, hemorrhage, hydrocephalus, extra-axial collection or mass lesion/mass effect. Age normal brain volume. Vascular: No hyperdense vessel or unexpected calcification. Skull: Negative for fracture Sinuses/Orbits: No evidence of injury. Bilateral cataract resection. Right mastoid and middle ear opacification scalping at the petrous apex but no acute or interval erosion. The opacification has progressed. Negative nasopharynx. CT CERVICAL SPINE FINDINGS Alignment: No traumatic malalignment. Skull base and vertebrae: Negative for acute fracture Soft tissues and spinal canal: No prevertebral fluid or swelling. No visible canal hematoma. Disc levels: Lower cervical degenerative disc narrowing. Overall mild degenerative changes for age Upper chest: No acute finding IMPRESSION: 1. No evidence of acute intracranial or cervical spine injury. 2. Right mastoid and middle ear opacification that has progressed from CT last year, please correlate with otoscopy. The nasopharynx is unremarkable. Electronically Signed   By: Monte Fantasia M.D.   On: 10/04/2018 05:31        Discharge Exam: Vitals:   10/07/18 0707 10/07/18 1258  BP: (!) 152/88 (!) 154/66  Pulse: (!) 53 73  Resp: 15 16  Temp: 98 F (36.7 C) 98.2 F (36.8 C)  SpO2: 96% 99%   Vitals:   10/06/18 1622 10/06/18 2054 10/07/18 0707 10/07/18 1258  BP: (!) 109/54 (!) 159/68 (!) 152/88 (!) 154/66  Pulse: (!) 59 65 (!) 53 73  Resp: 16 16 15 16   Temp: 98 F (36.7 C) (!) 97.5 F (36.4 C) 98 F (36.7 C) 98.2 F (36.8 C)  TempSrc: Oral Oral Oral Oral  SpO2: 98% 97% 96% 99%  Weight:       Height:        General: Pt is alert, awake, not in acute distress Cardiovascular: RRR, S1/S2 +, no rubs, no gallops Respiratory: fine bibasilar rales. Abdominal: Soft, NT, ND, bowel sounds + Extremities: no edema, no cyanosis   The results of significant diagnostics from this hospitalization (including imaging, microbiology, ancillary and laboratory) are listed below for reference.    Significant Diagnostic Studies: Ct Head Wo Contrast  Result Date: 10/04/2018 CLINICAL DATA:  Minor head trauma, on anticoagulation EXAM: CT HEAD WITHOUT CONTRAST CT CERVICAL SPINE WITHOUT CONTRAST TECHNIQUE: Multidetector CT imaging of the head and cervical spine was performed following the standard protocol without intravenous contrast. Multiplanar CT image reconstructions of the cervical spine were also generated. COMPARISON:  11/02/2017 FINDINGS: CT HEAD FINDINGS Brain: No evidence of acute infarction, hemorrhage, hydrocephalus, extra-axial collection or mass lesion/mass effect. Age normal brain volume. Vascular: No hyperdense vessel or unexpected calcification. Skull: Negative for fracture Sinuses/Orbits: No evidence of injury. Bilateral cataract resection. Right mastoid and middle ear opacification scalping at the petrous apex but no acute or interval erosion. The opacification has progressed. Negative nasopharynx. CT CERVICAL SPINE FINDINGS Alignment: No traumatic malalignment. Skull base and vertebrae: Negative for acute fracture Soft tissues and spinal canal: No prevertebral fluid or swelling. No visible canal hematoma. Disc levels: Lower cervical degenerative disc narrowing. Overall mild degenerative changes for age Upper chest: No acute finding IMPRESSION: 1. No evidence of acute intracranial or cervical spine injury. 2. Right mastoid and middle ear opacification that has progressed from CT last year, please correlate with otoscopy. The nasopharynx is  unremarkable. Electronically Signed   By: Monte Fantasia  M.D.   On: 10/04/2018 05:31   Ct Cervical Spine Wo Contrast  Result Date: 10/04/2018 CLINICAL DATA:  Minor head trauma, on anticoagulation EXAM: CT HEAD WITHOUT CONTRAST CT CERVICAL SPINE WITHOUT CONTRAST TECHNIQUE: Multidetector CT imaging of the head and cervical spine was performed following the standard protocol without intravenous contrast. Multiplanar CT image reconstructions of the cervical spine were also generated. COMPARISON:  11/02/2017 FINDINGS: CT HEAD FINDINGS Brain: No evidence of acute infarction, hemorrhage, hydrocephalus, extra-axial collection or mass lesion/mass effect. Age normal brain volume. Vascular: No hyperdense vessel or unexpected calcification. Skull: Negative for fracture Sinuses/Orbits: No evidence of injury. Bilateral cataract resection. Right mastoid and middle ear opacification scalping at the petrous apex but no acute or interval erosion. The opacification has progressed. Negative nasopharynx. CT CERVICAL SPINE FINDINGS Alignment: No traumatic malalignment. Skull base and vertebrae: Negative for acute fracture Soft tissues and spinal canal: No prevertebral fluid or swelling. No visible canal hematoma. Disc levels: Lower cervical degenerative disc narrowing. Overall mild degenerative changes for age Upper chest: No acute finding IMPRESSION: 1. No evidence of acute intracranial or cervical spine injury. 2. Right mastoid and middle ear opacification that has progressed from CT last year, please correlate with otoscopy. The nasopharynx is unremarkable. Electronically Signed   By: Monte Fantasia M.D.   On: 10/04/2018 05:31     Microbiology: Recent Results (from the past 240 hour(s))  SARS Coronavirus 2 [facility] order, Performed in [facility] hospital lab) Nasopharyngeal Nasopharyngeal Swab     Status: None   Collection Time: 10/04/18  3:18 AM   Specimen: Nasopharyngeal Swab  Result Value Ref Range Status   SARS Coronavirus 2 NEGATIVE NEGATIVE Final    Comment:  (NOTE) If result is NEGATIVE SARS-CoV-2 target nucleic acids are NOT DETECTED. The SARS-CoV-2 RNA is generally detectable in upper and lower  respiratory specimens during the acute phase of infection. The lowest  concentration of SARS-CoV-2 viral copies this assay can detect is 250  copies / mL. A negative result does not preclude SARS-CoV-2 infection  and should not be used as the sole basis for treatment or other  patient management decisions.  A negative result may occur with  improper specimen collection / handling, submission of specimen other  than nasopharyngeal swab, presence of viral mutation(s) within the  areas targeted by this assay, and inadequate number of viral copies  (<250 copies / mL). A negative result must be combined with clinical  observations, patient history, and epidemiological information. If result is POSITIVE SARS-CoV-2 target nucleic acids are DETECTED. The SARS-CoV-2 RNA is generally detectable in upper and lower  respiratory specimens dur ing the acute phase of infection.  Positive  results are indicative of active infection with SARS-CoV-2.  Clinical  correlation with patient history and other diagnostic information is  necessary to determine patient infection status.  Positive results do  not rule out bacterial infection or co-infection with other viruses. If result is PRESUMPTIVE POSTIVE SARS-CoV-2 nucleic acids MAY BE PRESENT.   A presumptive positive result was obtained on the submitted specimen  and confirmed on repeat testing.  While 2019 novel coronavirus  (SARS-CoV-2) nucleic acids may be present in the submitted sample  additional confirmatory testing may be necessary for epidemiological  and / or clinical management purposes  to differentiate between  SARS-CoV-2 and other Sarbecovirus currently known to infect humans.  If clinically indicated additional testing with an alternate test  methodology [phone]) is  advised. The SARS-CoV-2 RNA is  generally  detectable in upper and lower respiratory sp ecimens during the acute  phase of infection. The expected result is Negative. Fact Sheet for Patients:  StrictlyIdeas.no Fact Sheet for Healthcare Providers: BankingDealers.co.za This test is not yet approved or cleared by the [place] FDA and has been authorized for detection and/or diagnosis of SARS-CoV-2 by FDA under an Emergency Use Authorization (EUA).  This EUA will remain in effect (meaning this test can be used) for the duration of the COVID-19 declaration under Section 564(b)(1) of the Act, 21 U.S.C. section 360bbb-3(b)(1), unless the authorization is terminated or revoked sooner. Performed at [facility], [address] [place], [place] [address]   Culture, Urine     Status: Abnormal   Collection Time: 10/04/18  9:03 AM   Specimen: Urine, Clean Catch  Result Value Ref Range Status   Specimen Description   Final    URINE, CLEAN CATCH Performed at [facility], [address] [place], [place] [address]    Special Requests   Final    NONE Performed at [facility], [address] [place], [place] [address]    Culture (A)  Final    <10,000 COLONIES/mL INSIGNIFICANT GROWTH Performed at [facility] [address], [place], [place] [address]    Report Status 10/05/2018 FINAL  Final  MRSA PCR Screening     Status: None   Collection Time: 10/04/18 11:35 AM   Specimen: Nasopharyngeal  Result Value Ref Range Status   MRSA by PCR NEGATIVE NEGATIVE Final    Comment:        The GeneXpert MRSA Assay (FDA approved for NASAL specimens only), is one component of a comprehensive MRSA colonization surveillance program. It is not intended to diagnose MRSA infection nor to guide or monitor treatment for MRSA infections. Performed at [facility], [address], [place], [place] [address]      Labs: Basic Metabolic Panel: Recent Labs  Lab 10/04/18 0428  10/05/18 0519 10/06/18 0636 10/07/18 0642  NA 141 142 142 141  K 2.9* 3.5 3.7 4.0  CL 107 108 107 110  CO2 25 23 26 26   GLUCOSE 117* 97 92 94  BUN 11 11 13 15   CREATININE 0.68 0.71 0.64 0.68  CALCIUM 8.3* 8.4* 8.6* 8.1*  MG 1.9 1.8 2.0 1.7   Liver Function Tests: No results for input(s): AST, ALT, ALKPHOS, BILITOT, PROT, ALBUMIN in the last 168 hours. No results for input(s): LIPASE, AMYLASE in the last 168 hours. No results for input(s): AMMONIA in the last 168 hours. CBC: Recent Labs  Lab 10/04/18 0428 10/05/18 0519 10/07/18 0642  WBC 10.5 10.8* 8.0  NEUTROABS 7.9*  --   --   HGB 13.6 13.7 13.3  HCT 43.1 43.5 41.0  MCV 90.2 91.4 88.4  PLT 230 234 232   Cardiac Enzymes: No results for input(s): CKTOTAL, CKMB, CKMBINDEX, TROPONINI in the last 168 hours. BNP: Invalid input(s): POCBNP CBG: No results for input(s): GLUCAP in the last 168 hours.  Time coordinating discharge:  36 minutes  Signed:  Orson Eva, DO [facility] Pager: [phone] 10/07/2018, 6:40 PM

## 2018-10-08 NOTE — Care Management (Signed)
10/08/18: 1530:  CM received message that patient discharged on 10/07/18. Recommended for SNF, but elected to go home. Referred to [facility] for home health PT. Vaughan Basta of [facility] notified and will follow up with patient.

## 2018-10-09 ENCOUNTER — Telehealth: Payer: Self-pay | Admitting: Cardiovascular Disease

## 2018-10-09 DIAGNOSIS — M549 Dorsalgia, unspecified: Secondary | ICD-10-CM | POA: Diagnosis not present

## 2018-10-09 DIAGNOSIS — G8929 Other chronic pain: Secondary | ICD-10-CM | POA: Diagnosis not present

## 2018-10-09 DIAGNOSIS — S51812D Laceration without foreign body of left forearm, subsequent encounter: Secondary | ICD-10-CM | POA: Diagnosis not present

## 2018-10-09 DIAGNOSIS — I080 Rheumatic disorders of both mitral and aortic valves: Secondary | ICD-10-CM | POA: Diagnosis not present

## 2018-10-09 DIAGNOSIS — E785 Hyperlipidemia, unspecified: Secondary | ICD-10-CM | POA: Diagnosis not present

## 2018-10-09 DIAGNOSIS — G903 Multi-system degeneration of the autonomic nervous system: Secondary | ICD-10-CM | POA: Diagnosis not present

## 2018-10-09 DIAGNOSIS — K5900 Constipation, unspecified: Secondary | ICD-10-CM | POA: Diagnosis not present

## 2018-10-09 DIAGNOSIS — I209 Angina pectoris, unspecified: Secondary | ICD-10-CM | POA: Diagnosis not present

## 2018-10-09 DIAGNOSIS — G20 Parkinson's disease: Secondary | ICD-10-CM | POA: Diagnosis not present

## 2018-10-09 DIAGNOSIS — E039 Hypothyroidism, unspecified: Secondary | ICD-10-CM | POA: Diagnosis not present

## 2018-10-09 DIAGNOSIS — I495 Sick sinus syndrome: Secondary | ICD-10-CM | POA: Diagnosis not present

## 2018-10-09 DIAGNOSIS — Z950 Presence of cardiac pacemaker: Secondary | ICD-10-CM | POA: Diagnosis not present

## 2018-10-09 DIAGNOSIS — E876 Hypokalemia: Secondary | ICD-10-CM | POA: Diagnosis not present

## 2018-10-09 DIAGNOSIS — L89150 Pressure ulcer of sacral region, unstageable: Secondary | ICD-10-CM | POA: Diagnosis not present

## 2018-10-09 DIAGNOSIS — Z7901 Long term (current) use of anticoagulants: Secondary | ICD-10-CM | POA: Diagnosis not present

## 2018-10-09 DIAGNOSIS — I4819 Other persistent atrial fibrillation: Secondary | ICD-10-CM | POA: Diagnosis not present

## 2018-10-09 DIAGNOSIS — I4510 Unspecified right bundle-branch block: Secondary | ICD-10-CM | POA: Diagnosis not present

## 2018-10-09 DIAGNOSIS — M1990 Unspecified osteoarthritis, unspecified site: Secondary | ICD-10-CM | POA: Diagnosis not present

## 2018-10-09 NOTE — Telephone Encounter (Signed)
Called and gave okay per verbal. They will fax over the orders for him to sign will route to primary to make aware.

## 2018-10-09 NOTE — Telephone Encounter (Signed)
  Abigail Taylor with [facility] needs verbal orders for physical therapy twice a week for four weeks and Abigail Taylor would also like to request a nurse to come out to see the patient for a bed sore if Dr C is okay with that. Can leave detailed message on voicemail.

## 2018-10-09 NOTE — Telephone Encounter (Signed)
Yes, please order PT and [facility].

## 2018-10-09 NOTE — Telephone Encounter (Signed)
Please advise if okay to do verbal orders? Thanks!

## 2018-10-10 ENCOUNTER — Other Ambulatory Visit: Payer: Self-pay | Admitting: *Deleted

## 2018-10-10 ENCOUNTER — Telehealth: Payer: Self-pay | Admitting: Cardiovascular Disease

## 2018-10-10 DIAGNOSIS — G903 Multi-system degeneration of the autonomic nervous system: Secondary | ICD-10-CM | POA: Diagnosis not present

## 2018-10-10 DIAGNOSIS — I4819 Other persistent atrial fibrillation: Secondary | ICD-10-CM | POA: Diagnosis not present

## 2018-10-10 DIAGNOSIS — Z1389 Encounter for screening for other disorder: Secondary | ICD-10-CM | POA: Diagnosis not present

## 2018-10-10 DIAGNOSIS — Z0000 Encounter for general adult medical examination without abnormal findings: Secondary | ICD-10-CM | POA: Diagnosis not present

## 2018-10-10 DIAGNOSIS — I4891 Unspecified atrial fibrillation: Secondary | ICD-10-CM | POA: Diagnosis not present

## 2018-10-10 DIAGNOSIS — G20 Parkinson's disease: Secondary | ICD-10-CM | POA: Diagnosis not present

## 2018-10-10 DIAGNOSIS — T1490XA Injury, unspecified, initial encounter: Secondary | ICD-10-CM | POA: Diagnosis not present

## 2018-10-10 DIAGNOSIS — L8990 Pressure ulcer of unspecified site, unspecified stage: Secondary | ICD-10-CM | POA: Diagnosis not present

## 2018-10-10 DIAGNOSIS — E063 Autoimmune thyroiditis: Secondary | ICD-10-CM | POA: Diagnosis not present

## 2018-10-10 DIAGNOSIS — R449 Unspecified symptoms and signs involving general sensations and perceptions: Secondary | ICD-10-CM | POA: Diagnosis not present

## 2018-10-10 DIAGNOSIS — Z681 Body mass index (BMI) 19 or less, adult: Secondary | ICD-10-CM | POA: Diagnosis not present

## 2018-10-10 DIAGNOSIS — L89150 Pressure ulcer of sacral region, unstageable: Secondary | ICD-10-CM | POA: Diagnosis not present

## 2018-10-10 DIAGNOSIS — S51812D Laceration without foreign body of left forearm, subsequent encounter: Secondary | ICD-10-CM | POA: Diagnosis not present

## 2018-10-10 DIAGNOSIS — I209 Angina pectoris, unspecified: Secondary | ICD-10-CM | POA: Diagnosis not present

## 2018-10-10 NOTE — Telephone Encounter (Signed)
New message  Per Sonia Baller patient has unstageable sacrum. Sonia Baller needs order to go out once a week for 9 weeks for wound care. Please call  Sonia Baller to discuss the entire order.

## 2018-10-10 NOTE — Telephone Encounter (Signed)
Abigail Taylor the home health nurse called requesting verbal approval to:  1. See the patient for 9 weeks, once a week  2. Change the foam dressing on her sacrum twice a week. Teaching the husband how to do this as well. 3. She has a skin tear on her left arm which will be changed twice a week with a foam dressing.

## 2018-10-10 NOTE — Telephone Encounter (Signed)
Yes, it is!

## 2018-10-10 NOTE — Patient Outreach (Signed)
[facility]) Care Management  10/10/2018  Caressa Herzberg Alperin 1931/06/17 [id number]   Referral received: 10/05/2018 Initial outreach attempt: 10/10/2018  Telephone Assessment-Afib/prevention measures  RN introduced [facility] services and purpose for today's call. RN spoke with pt's spouse Doren Custard) who indicated pt was currently with her visiting nurse ([place]) however was able to place pt on the phone for permission to inquire further on her management of care and recent hospital discharge. RN spoke with the spouse Doren Custard) who detail the pt's recent hospitalization and verified pt recently visited her provider this morning with review of all medications. Spouse did not wish to review medications at this time however mentioned pt continues to take Eliquis and has been placed on a stronger antibiotic. Verified spouse has an understanding of all pt's medications as spouse provides administration to pt on her medications. No pill box needed at this time as caregiver is organized with the process with no encountered problems. RN also verified pt has sufficient transportation to all medical appointments with no needed resources. Will alert caregiver [facility] has available pharmacy on medication issues and social workers for any needed resources within the community.   Further discussion on pt's recent hospitalization related to atrial fibrillation. Reports RN is currently visiting the pt today with the initial education on AFib. RN offered to send printable information and review accordingly. Receptive to received [facility] packet and education information from [facility]. Present a plan of care related to Afib and prevention measures with adherence to mediations and medical appointments. Spouse agreed to quarterly follow ups in additional to another outreach early next month to complete the initial assessment required for ongoing [facility] services. Will address pt's ongoing management of care related to the  plan of care generated today. Will also notify pt's primary provider of pt's participation in the [facility] program and services.   THN CM Care Plan Problem One     Most Recent Value  Care Plan Problem One  Recent hospitalization related to Atrial Fibrillation  Role Documenting the Problem One  Care Management [place] for Problem One  Active  THN Long Term Goal   No readmission related to Atrial Fibrillation with in the next 90 days.  THN Long Term Goal Start Date  10/10/18  Interventions for Problem One Long Term Goal  Discuss the importance or avoiding readmission related to afib. Stress the importance of consult her provider with any precipitating symptoms. Will educate accordingly on what to do if acute symptoms are presented.  THN CM Short Term Goal #1   Pt will have an increase knowledge base on Atrial Fibrillation within the next 30 days.  THN CM Short Term Goal #1 Start Date  10/10/18  Interventions for Short Term Goal #1  Will discussed the Afib zones and potential encounters and what to do if acute. Will stress the importance o early intervention to avoid reamission or acute interventions. Will offer to mail-out printable information on Afib and request pt/spouse to review for improving pt's management of care to avoid acute encounters.  THN CM Short Term Goal #2   Aderence with medications post dischrage within the next 30 days.  THN CM Short Term Goal #2 Start Date  10/10/18  Interventions for Short Term Goal #2  WIll discuss pt's understanding of all medications and spouse participation in administration of all prescribed medications. Will verified the need for organization on pt's medication (confirmed). Will verify any new medications as spouse reports new antibiotic an  Eliquis twice daily.       Raina Mina, RN Care Management Coordinator [place] Office [phone]

## 2018-10-10 NOTE — Telephone Encounter (Signed)
Sonia Baller has been called and made aware. She has verbalized her understanding.

## 2018-10-12 ENCOUNTER — Telehealth (HOSPITAL_COMMUNITY): Payer: Self-pay | Admitting: Physical Therapy

## 2018-10-12 DIAGNOSIS — G903 Multi-system degeneration of the autonomic nervous system: Secondary | ICD-10-CM | POA: Diagnosis not present

## 2018-10-12 DIAGNOSIS — G20 Parkinson's disease: Secondary | ICD-10-CM | POA: Diagnosis not present

## 2018-10-12 DIAGNOSIS — I209 Angina pectoris, unspecified: Secondary | ICD-10-CM | POA: Diagnosis not present

## 2018-10-12 DIAGNOSIS — S51812D Laceration without foreign body of left forearm, subsequent encounter: Secondary | ICD-10-CM | POA: Diagnosis not present

## 2018-10-12 DIAGNOSIS — I4819 Other persistent atrial fibrillation: Secondary | ICD-10-CM | POA: Diagnosis not present

## 2018-10-12 DIAGNOSIS — L89150 Pressure ulcer of sacral region, unstageable: Secondary | ICD-10-CM | POA: Diagnosis not present

## 2018-10-12 NOTE — Telephone Encounter (Signed)
pt's husband called to cancel all of the appts that were scheduled due to his wife will be be receiving in home therapy.

## 2018-10-15 DIAGNOSIS — G903 Multi-system degeneration of the autonomic nervous system: Secondary | ICD-10-CM | POA: Diagnosis not present

## 2018-10-15 DIAGNOSIS — S51812D Laceration without foreign body of left forearm, subsequent encounter: Secondary | ICD-10-CM | POA: Diagnosis not present

## 2018-10-15 DIAGNOSIS — I4819 Other persistent atrial fibrillation: Secondary | ICD-10-CM | POA: Diagnosis not present

## 2018-10-15 DIAGNOSIS — L89150 Pressure ulcer of sacral region, unstageable: Secondary | ICD-10-CM | POA: Diagnosis not present

## 2018-10-15 DIAGNOSIS — G20 Parkinson's disease: Secondary | ICD-10-CM | POA: Diagnosis not present

## 2018-10-15 DIAGNOSIS — I209 Angina pectoris, unspecified: Secondary | ICD-10-CM | POA: Diagnosis not present

## 2018-10-17 ENCOUNTER — Encounter (HOSPITAL_COMMUNITY): Payer: Self-pay

## 2018-10-17 ENCOUNTER — Ambulatory Visit (HOSPITAL_COMMUNITY): Payer: Medicare Other | Admitting: Physical Therapy

## 2018-10-17 DIAGNOSIS — I209 Angina pectoris, unspecified: Secondary | ICD-10-CM | POA: Diagnosis not present

## 2018-10-17 DIAGNOSIS — G903 Multi-system degeneration of the autonomic nervous system: Secondary | ICD-10-CM | POA: Diagnosis not present

## 2018-10-17 DIAGNOSIS — I4819 Other persistent atrial fibrillation: Secondary | ICD-10-CM | POA: Diagnosis not present

## 2018-10-17 DIAGNOSIS — G20 Parkinson's disease: Secondary | ICD-10-CM | POA: Diagnosis not present

## 2018-10-17 DIAGNOSIS — S51812D Laceration without foreign body of left forearm, subsequent encounter: Secondary | ICD-10-CM | POA: Diagnosis not present

## 2018-10-17 DIAGNOSIS — L89150 Pressure ulcer of sacral region, unstageable: Secondary | ICD-10-CM | POA: Diagnosis not present

## 2018-10-18 ENCOUNTER — Telehealth: Payer: Self-pay | Admitting: Cardiovascular Disease

## 2018-10-18 NOTE — Telephone Encounter (Signed)
New Message  Patient's husband Maysam Mcgauley states that he will be accompanying the patient to her appointment with Almyra Deforest PA on 10/23/18 at 10:45 am. Please call and confirm.

## 2018-10-19 NOTE — Telephone Encounter (Signed)
That is fine with me, Lattie Haw.

## 2018-10-22 ENCOUNTER — Ambulatory Visit (HOSPITAL_COMMUNITY): Payer: Medicare Other | Admitting: Physical Therapy

## 2018-10-22 DIAGNOSIS — S51812D Laceration without foreign body of left forearm, subsequent encounter: Secondary | ICD-10-CM | POA: Diagnosis not present

## 2018-10-22 DIAGNOSIS — I209 Angina pectoris, unspecified: Secondary | ICD-10-CM | POA: Diagnosis not present

## 2018-10-22 DIAGNOSIS — G20 Parkinson's disease: Secondary | ICD-10-CM | POA: Diagnosis not present

## 2018-10-22 DIAGNOSIS — I4819 Other persistent atrial fibrillation: Secondary | ICD-10-CM | POA: Diagnosis not present

## 2018-10-22 DIAGNOSIS — L89150 Pressure ulcer of sacral region, unstageable: Secondary | ICD-10-CM | POA: Diagnosis not present

## 2018-10-22 DIAGNOSIS — G903 Multi-system degeneration of the autonomic nervous system: Secondary | ICD-10-CM | POA: Diagnosis not present

## 2018-10-23 ENCOUNTER — Encounter: Payer: Self-pay | Admitting: Physician Assistant

## 2018-10-23 ENCOUNTER — Other Ambulatory Visit: Payer: Self-pay

## 2018-10-23 ENCOUNTER — Ambulatory Visit (INDEPENDENT_AMBULATORY_CARE_PROVIDER_SITE_OTHER): Payer: Medicare Other | Admitting: Physician Assistant

## 2018-10-23 VITALS — BP 142/60 | HR 62 | Ht 66.0 in | Wt 113.8 lb

## 2018-10-23 DIAGNOSIS — I4819 Other persistent atrial fibrillation: Secondary | ICD-10-CM

## 2018-10-23 DIAGNOSIS — E441 Mild protein-calorie malnutrition: Secondary | ICD-10-CM

## 2018-10-23 DIAGNOSIS — Z950 Presence of cardiac pacemaker: Secondary | ICD-10-CM

## 2018-10-23 DIAGNOSIS — E785 Hyperlipidemia, unspecified: Secondary | ICD-10-CM

## 2018-10-23 DIAGNOSIS — R109 Unspecified abdominal pain: Secondary | ICD-10-CM | POA: Diagnosis not present

## 2018-10-23 DIAGNOSIS — G903 Multi-system degeneration of the autonomic nervous system: Secondary | ICD-10-CM

## 2018-10-23 NOTE — Progress Notes (Signed)
Cardiology Office Note    Date:  10/25/2018   ID:  Abigail Taylor, DOB [birth year], MRN [id number]  PCP:  [name], MD  Cardiologist:  Dr. Sallyanne Kuster  Chief Complaint  Patient presents with   Hospitalization Follow-up    seen for Dr. Sallyanne Kuster.    History of Present Illness:  Abigail Taylor is a 85 y.o. female with past medical history of sinus node dysfunction, persistent A. fib, tachy-brady syndrome s/p Medtronic PPM, HLD, orthostatic hypotension and Parkinson's syndrome.  Patient had normal coronary artery on angiogram in 2001 and a normal nuclear stress test in 2013.  Her pacemaker was implanted in 2012.  She is not pacemaker dependent.  Patient was last seen by Dr. Sallyanne Kuster in January 2020 at which time she was doing well.  Amiodarone has been decreased to every other day at the time.  More recently, patient presented to the hospital in September 2020 after feeling dizzy getting out of the bed and fell to the ground.  After she got up, she felt like she was going to pass out.  Fortunately, there was no loss of consciousness.  On arrival to the hospital, she was noted to be in A. fib with RVR with heart rate of close to 160.  She also reported she has been taken off of amiodarone due to fear of his complication.  Amiodarone was resumed.  CT of the head was negative for acute process.  She self converted to normal sinus rhythm on amiodarone and was discharged on 200 mg daily.  Patient presents today for cardiology office visit.  She denies any chest pain or palpitation.  She is still concerned about the side effect of amiodarone therapy.  She feel nauseated this morning and has been having some abdominal discomfort recently.  Abdominal discomfort has been going on since before she went to the hospital and before reinitiation of amiodarone.  She says she was probably off of amiodarone therapy for the past year prior to hospital visit however has been compliant with this since.  While my  biggest concern was [name] is her continued loss of weight.  She has lost additional 7 pounds since July.  She says she does not have much appetite and to eat 2 meals and drink a boost during the day.  I urged her to increase the frequency of her meals as I fear her continued malnutrition will cause further deterioration of her health and worsening episodes of orthostatic hypotension.  She also has a bedsore in her sacral area that is been managed by home health nurse.  If she has further falling episode, I would have very low threshold to take her off of amiodarone therapy given her worsening frailty.   Past Medical History:  Diagnosis Date   Angina at rest, with the tachycardia 07/05/2011   Aortic insufficiency 07/05/2011   Atrial fibrillation (HCC)    Brady-tachy syndrome ([place])    Dual chamber Medtronic pacemaker Adapta   Chronic back pain    Constipation 12/27/2011   Gait instability    Hyperlipidemia    Hypothyroidism    Normal cardiac stress test 2013   OA (osteoarthritis)    Pacemaker    Parkinson's disease [facility])    Tremor     Past Surgical History:  Procedure Laterality Date   ABDOMINAL HYSTERECTOMY     CARDIAC CATHETERIZATION  05/26/1999   normal   CARDIAC CATHETERIZATION  2001   normal coronary arteries   cataract  COLONOSCOPY N/A 11/15/2012   Procedure: COLONOSCOPY;  Surgeon: [name], MD;  [facility];  Service: Endoscopy;  Laterality: N/A;  1030   ESOPHAGOGASTRODUODENOSCOPY (EGD) WITH PROPOFOL N/A 11/06/2017   Procedure: ESOPHAGOGASTRODUODENOSCOPY (EGD) WITH PROPOFOL;  Surgeon: [name], MD;  Location: [facility];  Service: Endoscopy;  Laterality: N/A;   HIP ARTHROPLASTY Left 06/24/2016   Procedure: ARTHROPLASTY BIPOLAR HIP (HEMIARTHROPLASTY);  Surgeon: Altamese Donnellson, MD;  Location: [place];  Service: Orthopedics;  Laterality: Left;   INSERT / REPLACE / REMOVE PACEMAKER     NM MYOVIEW LTD  07/19/2011   normal    PERMANENT PACEMAKER INSERTION  07/05/2011   Medtronic Adapta dual chamber   PERMANENT PACEMAKER INSERTION N/A 07/05/2011   Procedure: PERMANENT PACEMAKER INSERTION;  Surgeon: [name], MD;  Location: [place] CATH LAB;  Service: Cardiovascular;  Laterality: N/A;    Current Medications: Outpatient Medications Prior to Visit  Medication Sig Dispense Refill   acetaminophen (TYLENOL) 500 MG tablet Take 1,000 mg by mouth every 6 (six) hours as needed for mild pain.     amiodarone (PACERONE) 200 MG tablet Take 1 tablet (200 mg total) by mouth daily.     apixaban (ELIQUIS) 2.5 MG TABS tablet Take 1 tablet (2.5 mg total) by mouth 2 (two) times daily. 180 tablet 0   carbidopa-levodopa (SINEMET CR) 50-200 MG tablet TAKE 1 TABLET BY MOUTH EVERYDAY AT BEDTIME (Patient taking differently: Take 1 tablet by mouth at bedtime. TAKE 1 TABLET BY MOUTH EVERYDAY AT BEDTIME) 90 tablet 1   Carbidopa-Levodopa ER (SINEMET CR) 25-100 MG tablet controlled release 2 at 7am/2 at 11am/1 at 4pm (Patient taking differently: Take 2 tablets by mouth See admin instructions. Take 50-200mg  daily at 7am) 450 tablet 1   doxycycline (VIBRA-TABS) 100 MG tablet Take 100 mg by mouth 2 (two) times daily.     fludrocortisone (FLORINEF) 0.1 MG tablet TAKE 1 TABLET BY MOUTH EVERY DAY (Patient taking differently: Take 0.1 mg by mouth daily. ) 90 tablet 3   levothyroxine (SYNTHROID, LEVOTHROID) 75 MCG tablet Take 75 mcg by mouth daily before breakfast.     LINZESS 145 MCG CAPS capsule Take 145 mcg by mouth daily.  11   rosuvastatin (CRESTOR) 10 MG tablet Take 10 mg by mouth at bedtime. Reported on 07/09/2015     traMADol (ULTRAM) 50 MG tablet Take 50 mg by mouth every 6 (six) hours as needed for moderate pain.     XIIDRA 5 % SOLN Place 1 drop into the left eye 2 (two) times daily.      No facility-administered medications prior to visit.      Allergies:   Patient has no known allergies.   Social History   Socioeconomic  History   Marital status: Married    Spouse name: Not on file   Number of children: 3   Years of education: Not on file   Highest education level: Some college, no degree  Occupational History   Occupation: retired    Comment: Air cabin crew Tobacco  Social Designer, fashion/clothing strain: Not on file   Food insecurity    Worry: Not on file    Inability: Not on file   Transportation needs    Medical: Not on file    Non-medical: Not on file  Tobacco Use   Smoking status: Never Smoker   Smokeless tobacco: Never Used  Substance and Sexual Activity   Alcohol use: No    Alcohol/week: 0.0 standard  drinks   Drug use: No   Sexual activity: Not on file  Lifestyle   Physical activity    Days per week: Not on file    Minutes per session: Not on file   Stress: Not on file  Relationships   Social connections    Talks on phone: Not on file    Gets together: Not on file    Attends religious service: Not on file    Active member of club or organization: Not on file    Attends meetings of clubs or organizations: Not on file    Relationship status: Not on file  Other Topics Concern   Not on file  Social History Narrative   Not on file     Family History:  The patient's family history includes Cancer in her father; Colon cancer (age of onset: 42) in her mother; Healthy in her brother, daughter, and son; Heart attack in her mother; Kidney cancer in her sister; Suicidality in her son.   ROS:   Please see the history of present illness.    ROS All other systems reviewed and are negative.   PHYSICAL EXAM:   VS:  BP (!) 142/60    Pulse 62    Ht 5\' 6"  (1.676 m)    Wt 113 lb 12.8 oz (51.6 kg)    BMI 18.37 kg/m    GEN: Well nourished, well developed, in no acute distress  HEENT: normal  Neck: no JVD, carotid bruits, or masses Cardiac: RRR; no murmurs, rubs, or gallops,no edema  Respiratory:  clear to auscultation bilaterally, normal work of breathing GI: soft,  nontender, nondistended, + BS MS: no deformity or atrophy  Skin: warm and dry, no rash Neuro:  Alert and Oriented x 3, Strength and sensation are intact Psych: euthymic mood, full affect  Wt Readings from Last 3 Encounters:  10/23/18 113 lb 12.8 oz (51.6 kg)  10/05/18 118 lb 2.7 oz (53.6 kg)  08/16/18 120 lb (54.4 kg)      Studies/Labs Reviewed:   EKG:  EKG is ordered today.  The ekg ordered today demonstrates normal sinus rhythm, right bundle branch block.  Recent Labs: 11/04/2017: ALT 13 10/06/2018: TSH 2.886 10/07/2018: BUN 15; Creatinine, Ser 0.68; Hemoglobin 13.3; Magnesium 1.7; Platelets 232; Potassium 4.0; Sodium 141   Lipid Panel    Component Value Date/Time   CHOL 133 04/30/2015 0853   TRIG 84 04/30/2015 0853   HDL 54 04/30/2015 0853   CHOLHDL 2.5 04/30/2015 0853   VLDL 17 04/30/2015 0853   LDLCALC 62 04/30/2015 0853    Additional studies/ records that were reviewed today include:   Echo 10/04/2018  1. The left ventricle has normal systolic function with an ejection fraction of 60-65%. The cavity size was normal. There is moderately increased left ventricular wall thickness. Left ventricular diastolic Doppler parameters are consistent with  pseudonormalization. Elevated mean left atrial pressure.  2. The right ventricle has normal systolic function. The cavity was normal. There is no increase in right ventricular wall thickness.  3. Left atrial size was severely dilated.  4. Right atrial size was mildly dilated.  5. No evidence of mitral valve stenosis.  6. Tricuspid valve regurgitation is mild-moderate.  7. The aortic valve is tricuspid. Mild thickening of the aortic valve. Mild calcification of the aortic valve. Aortic valve regurgitation is mild to moderate by color flow Doppler. No stenosis of the aortic valve.  8. The aorta is normal unless otherwise noted.  9. The aortic  root is normal in size and structure. 10. Pulmonary hypertension is moderately elevated,  PASP is 45 mmHg. 11. The inferior vena cava was dilated in size with >50% respiratory variability.    ASSESSMENT:    1. Persistent atrial fibrillation ([place])   2. Pacemaker   3. Hyperlipidemia LDL goal <100   4. Orthostatic hypotension due to Parkinson's disease ([place])   5. Abdominal pain, unspecified abdominal location   6. Mild protein-calorie malnutrition ([place])      PLAN:  In order of problems listed above:  1. Persistent atrial fibrillation: Restarted on amiodarone therapy.  Continue Eliquis 2.5 mg twice daily.  She has a history of frequent fall, may need to consider discontinuation of Eliquis if she continued to have further episode of fall.  She is still very apprehensive regarding the side effect of amiodarone, however she does not have any clear symptoms of amiodarone toxicity.  We discussed the possibility of reducing her amiodarone to 100 mg daily in order to decrease toxicity and reduce the likelihood of side effect, she wished to continue on the 200 mg daily for now.  2. History of orthostatic hypotension: This is likely contributing to her frequent fall as she gets dizzy when she change body position too quickly.  Blood pressure today is okay.  She is on Florinef, question if she may be a candidate for Northera.  3. Sick sinus syndrome s/p pacemaker: Followed by Dr. Sallyanne Kuster  4. Hyperlipidemia: On Crestor 10 mg daily  5. Abdominal pain: This has been chronic for a while, even prior to the recent hospitalization and the reinitiation of amiodarone.  Follow-up with GI.  6. Malnutrition: She does not have much appetite and continue to lose weight.  She is currently eating 2 meals plus a boost, I recommended she increase to 3 meals a day plus the boost.  Unfortunately, this is contributing to her persistent deterioration    Medication Adjustments/Labs and Tests Ordered: Current medicines are reviewed at length with the patient today.  Concerns regarding medicines are outlined  above.  Medication changes, Labs and Tests ordered today are listed in the Patient Instructions below. Patient Instructions  Medication Instructions:  Your physician recommends that you continue on your current medications as directed. Please refer to the Current Medication list given to you today.  If you need a refill on your cardiac medications before your next appointment, please call your pharmacy.   Lab work: NONE ordered at this time of appointment   If you have labs (blood work) drawn today and your tests are completely normal, you will receive your results only by:  [place] (if you have MyChart) OR  A paper copy in the mail If you have any lab test that is abnormal or we need to change your treatment, we will call you to review the results.  Testing/Procedures: NONE ordered at this time of appointment   Follow-Up: At [facility], you and your health needs are our priority.  As part of our continuing mission to provide you with exceptional heart care, we have created designated Provider Care Teams.  These Care Teams include your primary Cardiologist (physician) and Advanced Practice Providers (APPs -  Physician Assistants and Nurse Practitioners) who all work together to provide you with the care you need, when you need it.  You will need a follow up appointment in 3-4 months with  [name], MD.  Any Other Special Instructions Will Be Listed Below (If Applicable).  Hilbert Corrigan, [place]  10/25/2018 8:51 AM    [address], [place], [place]  [address] Phone: [phone]; Fax: [phone]

## 2018-10-23 NOTE — Patient Instructions (Signed)
Medication Instructions:  Your physician recommends that you continue on your current medications as directed. Please refer to the Current Medication list given to you today.  If you need a refill on your cardiac medications before your next appointment, please call your pharmacy.   Lab work: NONE ordered at this time of appointment   If you have labs (blood work) drawn today and your tests are completely normal, you will receive your results only by: [place] MyChart Message (if you have MyChart) OR . A paper copy in the mail If you have any lab test that is abnormal or we need to change your treatment, we will call you to review the results.  Testing/Procedures: NONE ordered at this time of appointment   Follow-Up: At [facility], you and your health needs are our priority.  As part of our continuing mission to provide you with exceptional heart care, we have created designated Provider Care Teams.  These Care Teams include your primary Cardiologist (physician) and Advanced Practice Providers (APPs -  Physician Assistants and Nurse Practitioners) who all work together to provide you with the care you need, when you need it. . You will need a follow up appointment in 3-4 months with  [name], MD.  Any Other Special Instructions Will Be Listed Below (If Applicable).

## 2018-10-24 ENCOUNTER — Ambulatory Visit (HOSPITAL_COMMUNITY): Payer: Medicare Other | Admitting: Physical Therapy

## 2018-10-24 DIAGNOSIS — I2510 Atherosclerotic heart disease of native coronary artery without angina pectoris: Secondary | ICD-10-CM | POA: Diagnosis not present

## 2018-10-24 DIAGNOSIS — I4891 Unspecified atrial fibrillation: Secondary | ICD-10-CM | POA: Diagnosis not present

## 2018-10-24 DIAGNOSIS — E782 Mixed hyperlipidemia: Secondary | ICD-10-CM | POA: Diagnosis not present

## 2018-10-24 DIAGNOSIS — E039 Hypothyroidism, unspecified: Secondary | ICD-10-CM | POA: Diagnosis not present

## 2018-10-25 ENCOUNTER — Encounter: Payer: Self-pay | Admitting: Physician Assistant

## 2018-10-25 DIAGNOSIS — G903 Multi-system degeneration of the autonomic nervous system: Secondary | ICD-10-CM | POA: Diagnosis not present

## 2018-10-25 DIAGNOSIS — I209 Angina pectoris, unspecified: Secondary | ICD-10-CM | POA: Diagnosis not present

## 2018-10-25 DIAGNOSIS — G20 Parkinson's disease: Secondary | ICD-10-CM | POA: Diagnosis not present

## 2018-10-25 DIAGNOSIS — S51812D Laceration without foreign body of left forearm, subsequent encounter: Secondary | ICD-10-CM | POA: Diagnosis not present

## 2018-10-25 DIAGNOSIS — L89150 Pressure ulcer of sacral region, unstageable: Secondary | ICD-10-CM | POA: Diagnosis not present

## 2018-10-25 DIAGNOSIS — I4819 Other persistent atrial fibrillation: Secondary | ICD-10-CM | POA: Diagnosis not present

## 2018-10-26 ENCOUNTER — Ambulatory Visit (HOSPITAL_COMMUNITY): Payer: Medicare Other | Admitting: Physical Therapy

## 2018-10-29 ENCOUNTER — Ambulatory Visit (HOSPITAL_COMMUNITY): Payer: Medicare Other | Admitting: Physical Therapy

## 2018-10-29 DIAGNOSIS — L89150 Pressure ulcer of sacral region, unstageable: Secondary | ICD-10-CM | POA: Diagnosis not present

## 2018-10-29 DIAGNOSIS — G903 Multi-system degeneration of the autonomic nervous system: Secondary | ICD-10-CM | POA: Diagnosis not present

## 2018-10-29 DIAGNOSIS — I4819 Other persistent atrial fibrillation: Secondary | ICD-10-CM | POA: Diagnosis not present

## 2018-10-29 DIAGNOSIS — G20 Parkinson's disease: Secondary | ICD-10-CM | POA: Diagnosis not present

## 2018-10-29 DIAGNOSIS — I209 Angina pectoris, unspecified: Secondary | ICD-10-CM | POA: Diagnosis not present

## 2018-10-29 DIAGNOSIS — S51812D Laceration without foreign body of left forearm, subsequent encounter: Secondary | ICD-10-CM | POA: Diagnosis not present

## 2018-10-30 NOTE — Progress Notes (Signed)
Agree with continued amiodarone. She should have a pacemaker download in about 2 weeks and will review AF burden and rate control.

## 2018-10-31 ENCOUNTER — Ambulatory Visit (HOSPITAL_COMMUNITY): Payer: Medicare Other

## 2018-10-31 DIAGNOSIS — L89150 Pressure ulcer of sacral region, unstageable: Secondary | ICD-10-CM | POA: Diagnosis not present

## 2018-10-31 DIAGNOSIS — G20 Parkinson's disease: Secondary | ICD-10-CM | POA: Diagnosis not present

## 2018-10-31 DIAGNOSIS — S51812D Laceration without foreign body of left forearm, subsequent encounter: Secondary | ICD-10-CM | POA: Diagnosis not present

## 2018-10-31 DIAGNOSIS — I209 Angina pectoris, unspecified: Secondary | ICD-10-CM | POA: Diagnosis not present

## 2018-10-31 DIAGNOSIS — I4819 Other persistent atrial fibrillation: Secondary | ICD-10-CM | POA: Diagnosis not present

## 2018-10-31 DIAGNOSIS — G903 Multi-system degeneration of the autonomic nervous system: Secondary | ICD-10-CM | POA: Diagnosis not present

## 2018-11-02 ENCOUNTER — Ambulatory Visit (INDEPENDENT_AMBULATORY_CARE_PROVIDER_SITE_OTHER): Payer: Medicare Other | Admitting: *Deleted

## 2018-11-02 DIAGNOSIS — I480 Paroxysmal atrial fibrillation: Secondary | ICD-10-CM

## 2018-11-02 DIAGNOSIS — I495 Sick sinus syndrome: Secondary | ICD-10-CM

## 2018-11-03 LAB — CUP PACEART REMOTE DEVICE CHECK
Battery Impedance: 551 Ohm
Battery Remaining Longevity: 100 mo
Battery Voltage: 2.79 V
Brady Statistic AP VP Percent: 0 %
Brady Statistic AP VS Percent: 55 %
Brady Statistic AS VP Percent: 0 %
Brady Statistic AS VS Percent: 45 %
Date Time Interrogation Session: 20201009175617
Implantable Lead Implant Date: 20130611
Implantable Lead Implant Date: 20130611
Implantable Lead Location: 753859
Implantable Lead Location: 753860
Implantable Lead Model: 5076
Implantable Lead Model: 5076
Implantable Pulse Generator Implant Date: 20130611
Lead Channel Impedance Value: 436 Ohm
Lead Channel Impedance Value: 654 Ohm
Lead Channel Pacing Threshold Amplitude: 0.625 V
Lead Channel Pacing Threshold Amplitude: 0.75 V
Lead Channel Pacing Threshold Pulse Width: 0.4 ms
Lead Channel Pacing Threshold Pulse Width: 0.4 ms
Lead Channel Setting Pacing Amplitude: 1.5 V
Lead Channel Setting Pacing Amplitude: 2 V
Lead Channel Setting Pacing Pulse Width: 0.4 ms
Lead Channel Setting Sensing Sensitivity: 4 mV

## 2018-11-05 ENCOUNTER — Encounter: Payer: Self-pay | Admitting: *Deleted

## 2018-11-05 ENCOUNTER — Other Ambulatory Visit: Payer: Self-pay | Admitting: *Deleted

## 2018-11-05 DIAGNOSIS — G903 Multi-system degeneration of the autonomic nervous system: Secondary | ICD-10-CM | POA: Diagnosis not present

## 2018-11-05 DIAGNOSIS — L89150 Pressure ulcer of sacral region, unstageable: Secondary | ICD-10-CM | POA: Diagnosis not present

## 2018-11-05 DIAGNOSIS — G20 Parkinson's disease: Secondary | ICD-10-CM | POA: Diagnosis not present

## 2018-11-05 DIAGNOSIS — I209 Angina pectoris, unspecified: Secondary | ICD-10-CM | POA: Diagnosis not present

## 2018-11-05 DIAGNOSIS — S51812D Laceration without foreign body of left forearm, subsequent encounter: Secondary | ICD-10-CM | POA: Diagnosis not present

## 2018-11-05 DIAGNOSIS — I4819 Other persistent atrial fibrillation: Secondary | ICD-10-CM | POA: Diagnosis not present

## 2018-11-05 NOTE — Patient Outreach (Signed)
[place]) Care Management  11/05/2018  LORRIE GARGAN 1931/06/04 [id number]    Telephone Assessment-Prevention measures (Afib)  RN spoke with pt today and completed the initial assessment. Pt without additional needs however express her ongoing history and ongoing involvement with [facility] for nursing who is addressing a sacral wound the the nurse dressing once weekly (Monday) and her spouse on (Thursday). Also states she is receiving ongoing PT/OT in the home. Pt mentions the assisted device used on a regular (walker). States a syncope episode where she "passed out". Pt reports she was using her walker at the time but still had a fall and fractured a bone in her back several months ago. Pt continues to see a provider for her back fracture and continue to ambulate with no additional issues (denies any pain).   Discussed the current plan of care and goals/interventions. Pt reports she received the information sent in the mail. Requested pt to review the information and further discussed her Afib. Update the interventions and will continue to encourage adherence. Will re-evaluate on a quarterly telephone assessment as requested by the pt. Will update the provider accordingly with the goals of care.  THN CM Care Plan Problem One     Most Recent Value  Care Plan Problem One  Recent hospitalization related to Atrial Fibrillation  Role Documenting the Problem One  Care Management [place] for Problem One  Active  THN Long Term Goal   No readmission related to Atrial Fibrillation with in the next 90 days.  THN Long Term Goal Start Date  10/10/18  Interventions for Problem One Long Term Goal  Verified printed information received and pt continue to incorporate prevention measures to her daily routine to prevent readmission. Will verify pt continue withthe discussed plan of care. Will continue to encouraged adherence and re-evaluate quarterly as pt requested.  THN CM  Short Term Goal #1   Pt will have an increase knowledge base on Atrial Fibrillation within the next 30 days.  THN CM Short Term Goal #1 Start Date  10/10/18  Interventions for Short Term Goal #1  Will extend to allow pt to review the printed information sent on Afib. Will review this information and continue to encourage adherence.   THN CM Short Term Goal #2   Aderence with medications post dischrage within the next 30 days.  THN CM Short Term Goal #2 Start Date  10/10/18  [facility] CM Short Term Goal #2 Met Date  11/05/18      Raina Mina, RN Care Management Coordinator [place] Office [phone]

## 2018-11-06 DIAGNOSIS — I4819 Other persistent atrial fibrillation: Secondary | ICD-10-CM | POA: Diagnosis not present

## 2018-11-06 DIAGNOSIS — S51812D Laceration without foreign body of left forearm, subsequent encounter: Secondary | ICD-10-CM | POA: Diagnosis not present

## 2018-11-06 DIAGNOSIS — G20 Parkinson's disease: Secondary | ICD-10-CM | POA: Diagnosis not present

## 2018-11-06 DIAGNOSIS — G903 Multi-system degeneration of the autonomic nervous system: Secondary | ICD-10-CM | POA: Diagnosis not present

## 2018-11-06 DIAGNOSIS — L89150 Pressure ulcer of sacral region, unstageable: Secondary | ICD-10-CM | POA: Diagnosis not present

## 2018-11-06 DIAGNOSIS — I209 Angina pectoris, unspecified: Secondary | ICD-10-CM | POA: Diagnosis not present

## 2018-11-07 DIAGNOSIS — I10 Essential (primary) hypertension: Secondary | ICD-10-CM | POA: Diagnosis not present

## 2018-11-07 DIAGNOSIS — S32018G Other fracture of first lumbar vertebra, subsequent encounter for fracture with delayed healing: Secondary | ICD-10-CM | POA: Diagnosis not present

## 2018-11-07 DIAGNOSIS — Z681 Body mass index (BMI) 19 or less, adult: Secondary | ICD-10-CM | POA: Diagnosis not present

## 2018-11-08 DIAGNOSIS — Z950 Presence of cardiac pacemaker: Secondary | ICD-10-CM | POA: Diagnosis not present

## 2018-11-08 DIAGNOSIS — I4819 Other persistent atrial fibrillation: Secondary | ICD-10-CM | POA: Diagnosis not present

## 2018-11-08 DIAGNOSIS — I4510 Unspecified right bundle-branch block: Secondary | ICD-10-CM | POA: Diagnosis not present

## 2018-11-08 DIAGNOSIS — K5900 Constipation, unspecified: Secondary | ICD-10-CM | POA: Diagnosis not present

## 2018-11-08 DIAGNOSIS — I209 Angina pectoris, unspecified: Secondary | ICD-10-CM | POA: Diagnosis not present

## 2018-11-08 DIAGNOSIS — I495 Sick sinus syndrome: Secondary | ICD-10-CM | POA: Diagnosis not present

## 2018-11-08 DIAGNOSIS — Z7901 Long term (current) use of anticoagulants: Secondary | ICD-10-CM | POA: Diagnosis not present

## 2018-11-08 DIAGNOSIS — E785 Hyperlipidemia, unspecified: Secondary | ICD-10-CM | POA: Diagnosis not present

## 2018-11-08 DIAGNOSIS — E876 Hypokalemia: Secondary | ICD-10-CM | POA: Diagnosis not present

## 2018-11-08 DIAGNOSIS — S51812D Laceration without foreign body of left forearm, subsequent encounter: Secondary | ICD-10-CM | POA: Diagnosis not present

## 2018-11-08 DIAGNOSIS — L89150 Pressure ulcer of sacral region, unstageable: Secondary | ICD-10-CM | POA: Diagnosis not present

## 2018-11-08 DIAGNOSIS — G20 Parkinson's disease: Secondary | ICD-10-CM | POA: Diagnosis not present

## 2018-11-08 DIAGNOSIS — M549 Dorsalgia, unspecified: Secondary | ICD-10-CM | POA: Diagnosis not present

## 2018-11-08 DIAGNOSIS — G8929 Other chronic pain: Secondary | ICD-10-CM | POA: Diagnosis not present

## 2018-11-08 DIAGNOSIS — M1990 Unspecified osteoarthritis, unspecified site: Secondary | ICD-10-CM | POA: Diagnosis not present

## 2018-11-08 DIAGNOSIS — I080 Rheumatic disorders of both mitral and aortic valves: Secondary | ICD-10-CM | POA: Diagnosis not present

## 2018-11-08 DIAGNOSIS — G903 Multi-system degeneration of the autonomic nervous system: Secondary | ICD-10-CM | POA: Diagnosis not present

## 2018-11-08 DIAGNOSIS — E039 Hypothyroidism, unspecified: Secondary | ICD-10-CM | POA: Diagnosis not present

## 2018-11-12 NOTE — Progress Notes (Signed)
Remote pacemaker transmission.   

## 2018-11-14 DIAGNOSIS — I209 Angina pectoris, unspecified: Secondary | ICD-10-CM | POA: Diagnosis not present

## 2018-11-14 DIAGNOSIS — L89150 Pressure ulcer of sacral region, unstageable: Secondary | ICD-10-CM | POA: Diagnosis not present

## 2018-11-14 DIAGNOSIS — G20 Parkinson's disease: Secondary | ICD-10-CM | POA: Diagnosis not present

## 2018-11-14 DIAGNOSIS — I4819 Other persistent atrial fibrillation: Secondary | ICD-10-CM | POA: Diagnosis not present

## 2018-11-14 DIAGNOSIS — S51812D Laceration without foreign body of left forearm, subsequent encounter: Secondary | ICD-10-CM | POA: Diagnosis not present

## 2018-11-14 DIAGNOSIS — G903 Multi-system degeneration of the autonomic nervous system: Secondary | ICD-10-CM | POA: Diagnosis not present

## 2018-11-15 DIAGNOSIS — I739 Peripheral vascular disease, unspecified: Secondary | ICD-10-CM | POA: Diagnosis not present

## 2018-11-22 ENCOUNTER — Ambulatory Visit (INDEPENDENT_AMBULATORY_CARE_PROVIDER_SITE_OTHER): Payer: Medicare Other | Admitting: Nurse Practitioner

## 2018-11-22 DIAGNOSIS — L89150 Pressure ulcer of sacral region, unstageable: Secondary | ICD-10-CM | POA: Diagnosis not present

## 2018-11-22 DIAGNOSIS — I209 Angina pectoris, unspecified: Secondary | ICD-10-CM | POA: Diagnosis not present

## 2018-11-22 DIAGNOSIS — I4819 Other persistent atrial fibrillation: Secondary | ICD-10-CM | POA: Diagnosis not present

## 2018-11-22 DIAGNOSIS — G903 Multi-system degeneration of the autonomic nervous system: Secondary | ICD-10-CM | POA: Diagnosis not present

## 2018-11-22 DIAGNOSIS — S51812D Laceration without foreign body of left forearm, subsequent encounter: Secondary | ICD-10-CM | POA: Diagnosis not present

## 2018-11-22 DIAGNOSIS — G20 Parkinson's disease: Secondary | ICD-10-CM | POA: Diagnosis not present

## 2018-11-23 DIAGNOSIS — I4819 Other persistent atrial fibrillation: Secondary | ICD-10-CM | POA: Diagnosis not present

## 2018-11-23 DIAGNOSIS — G20 Parkinson's disease: Secondary | ICD-10-CM | POA: Diagnosis not present

## 2018-11-23 DIAGNOSIS — L89150 Pressure ulcer of sacral region, unstageable: Secondary | ICD-10-CM | POA: Diagnosis not present

## 2018-11-23 DIAGNOSIS — G903 Multi-system degeneration of the autonomic nervous system: Secondary | ICD-10-CM | POA: Diagnosis not present

## 2018-11-23 DIAGNOSIS — I209 Angina pectoris, unspecified: Secondary | ICD-10-CM | POA: Diagnosis not present

## 2018-11-23 DIAGNOSIS — S51812D Laceration without foreign body of left forearm, subsequent encounter: Secondary | ICD-10-CM | POA: Diagnosis not present

## 2018-11-28 DIAGNOSIS — S51812D Laceration without foreign body of left forearm, subsequent encounter: Secondary | ICD-10-CM | POA: Diagnosis not present

## 2018-11-28 DIAGNOSIS — L89150 Pressure ulcer of sacral region, unstageable: Secondary | ICD-10-CM | POA: Diagnosis not present

## 2018-11-28 DIAGNOSIS — G903 Multi-system degeneration of the autonomic nervous system: Secondary | ICD-10-CM | POA: Diagnosis not present

## 2018-11-28 DIAGNOSIS — I4819 Other persistent atrial fibrillation: Secondary | ICD-10-CM | POA: Diagnosis not present

## 2018-11-28 DIAGNOSIS — G20 Parkinson's disease: Secondary | ICD-10-CM | POA: Diagnosis not present

## 2018-11-28 DIAGNOSIS — I209 Angina pectoris, unspecified: Secondary | ICD-10-CM | POA: Diagnosis not present

## 2018-11-29 ENCOUNTER — Encounter (HOSPITAL_BASED_OUTPATIENT_CLINIC_OR_DEPARTMENT_OTHER): Payer: Medicare Other | Attending: Internal Medicine | Admitting: Internal Medicine

## 2018-11-29 ENCOUNTER — Other Ambulatory Visit: Payer: Self-pay

## 2018-11-29 DIAGNOSIS — Z950 Presence of cardiac pacemaker: Secondary | ICD-10-CM | POA: Insufficient documentation

## 2018-11-29 DIAGNOSIS — I739 Peripheral vascular disease, unspecified: Secondary | ICD-10-CM | POA: Insufficient documentation

## 2018-11-29 DIAGNOSIS — L89153 Pressure ulcer of sacral region, stage 3: Secondary | ICD-10-CM | POA: Diagnosis not present

## 2018-11-29 DIAGNOSIS — I495 Sick sinus syndrome: Secondary | ICD-10-CM | POA: Diagnosis not present

## 2018-11-29 DIAGNOSIS — L89512 Pressure ulcer of right ankle, stage 2: Secondary | ICD-10-CM | POA: Diagnosis not present

## 2018-11-29 DIAGNOSIS — G20 Parkinson's disease: Secondary | ICD-10-CM | POA: Diagnosis not present

## 2018-11-29 DIAGNOSIS — L89319 Pressure ulcer of right buttock, unspecified stage: Secondary | ICD-10-CM | POA: Diagnosis not present

## 2018-11-30 DIAGNOSIS — S51812D Laceration without foreign body of left forearm, subsequent encounter: Secondary | ICD-10-CM | POA: Diagnosis not present

## 2018-11-30 DIAGNOSIS — L89150 Pressure ulcer of sacral region, unstageable: Secondary | ICD-10-CM | POA: Diagnosis not present

## 2018-11-30 DIAGNOSIS — I4819 Other persistent atrial fibrillation: Secondary | ICD-10-CM | POA: Diagnosis not present

## 2018-11-30 DIAGNOSIS — I209 Angina pectoris, unspecified: Secondary | ICD-10-CM | POA: Diagnosis not present

## 2018-11-30 DIAGNOSIS — G20 Parkinson's disease: Secondary | ICD-10-CM | POA: Diagnosis not present

## 2018-11-30 DIAGNOSIS — G903 Multi-system degeneration of the autonomic nervous system: Secondary | ICD-10-CM | POA: Diagnosis not present

## 2018-12-03 NOTE — Progress Notes (Signed)
Abigail Taylor, Abigail Taylor ([id number]) Visit Report for 11/29/2018 Allergy List Details Patient Name: Date of Service: Abigail Taylor, Abigail Taylor 11/29/2018 2:45 PM Medical Record Number:[id number] Patient Account Number: [id number] Date of Birth/Sex: Treating RN: 08/27/1931 (85 y.o. Female) Levan Hurst Primary Care Arrick Dutton: [name] Other Clinician: Referring Cyndee Giammarco: Treating Lille Karim/Extender:Robson, Gillermina Phy, Sara Chu in Treatment: 0 Allergies Active Allergies No Known Drug Allergies Allergy Notes Electronic Signature(s) Signed: 12/03/2018 5:46:53 PM By: Levan Hurst RN, BSN Entered By: Levan Hurst on 11/29/2018 15:52:42 -------------------------------------------------------------------------------- Arrival Information Details Patient Name: Date of Service: Abigail Taylor 11/29/2018 2:45 PM Medical Record [id number] Patient Account Number: [id number] Date of Birth/Sex: Treating RN: 03-28-1931 (85 y.o. Female) Deon Pilling Primary Care Kalei Meda: [name] Other Clinician: Referring Arline Ketter: Treating Antoniette Peake/Extender:Robson, Gillermina Phy, Sara Chu in Treatment: 0 Visit Information Patient Arrived: Gilford Rile Arrival Time: 15:32 Accompanied By: husband Transfer Assistance: None Patient Identification Verified: Yes Secondary Verification Process Yes Completed: Patient Requires Transmission- No Based Precautions: Patient Has Alerts: Yes Patient Alerts: Patient on Blood Thinner Electronic Signature(s) Signed: 12/03/2018 5:46:53 PM By: Levan Hurst RN, BSN Entered By: Levan Hurst on 11/29/2018 15:51:47 -------------------------------------------------------------------------------- Clinic Level of Care Assessment Details Patient Name: Date of Service: Abigail Taylor, Abigail Taylor 11/29/2018 2:45 PM Medical Record [id number] Patient Account Number: [id number] Date of Birth/Sex: Treating RN: 1931/12/24 (85 y.o. Female) Kela Millin Primary Care Raylyn Speckman: [name] Other Clinician: Referring Desteny Freeman: Treating Mathilde Mcwherter/Extender:Robson, Gillermina Phy, Sara Chu in Treatment: 0 Clinic Level of Care Assessment Items TOOL 1 Quantity Score X - Use when EandM and Procedure is performed on INITIAL visit 1 0 ASSESSMENTS - Nursing Assessment / Reassessment X - General Physical Exam (combine w/ comprehensive assessment (listed just below) 1 20 when performed on new pt. evals) X - Comprehensive Assessment (HX, ROS, Risk Assessments, Wounds Hx, etc.) 1 25 ASSESSMENTS - Wound and Skin Assessment / Reassessment X - Dermatologic / Skin Assessment (not related to wound area) 1 10 ASSESSMENTS - Ostomy and/or Continence Assessment and Care []  - Incontinence Assessment and Management 0 []  - Ostomy Care Assessment and Management (repouching, etc.) 0 PROCESS - Coordination of Care X - Simple Patient / Family Education for ongoing care 1 15 []  - Complex (extensive) Patient / Family Education for ongoing care 0 X - Staff obtains Programmer, systems, Records, Test Results / Process Orders 1 10 X - Staff telephones HHA, Nursing Homes / Clarify orders / etc 1 10 []  - Routine Transfer to another Facility (non-emergent condition) 0 []  - Routine Hospital Admission (non-emergent condition) 0 X - New Admissions / Biomedical engineer / Ordering NPWT, Apligraf, etc. 1 15 []  - Emergency Hospital Admission (emergent condition) 0 PROCESS - Special Needs []  - Pediatric / Minor Patient Management 0 []  - Isolation Patient Management 0 []  - Hearing / Language / Visual special needs 0 []  - Assessment of Community assistance (transportation, D/C planning, etc.) 0 []  - Additional assistance / Altered mentation 0 X - Support Surface(s) Assessment (bed, cushion, seat, etc.) 1 15 INTERVENTIONS - Miscellaneous []  - External ear exam 0 []  - Patient Transfer (multiple staff / Civil Service fast streamer / Similar devices) 0 []  - Simple Staple / Suture removal  (25 or less) 0 []  - Complex Staple / Suture removal (26 or more) 0 []  - Hypo/Hyperglycemic Management (do not check if billed separately) 0 []  - Ankle / Brachial Index (ABI) - do not check if billed separately 0 Has the patient been seen at the hospital within the last three years: Yes Total Score: 120 Level  Of Care: New/Established - Level 4 Electronic Signature(s) Signed: 11/29/2018 5:16:49 PM By: Kela Millin Entered By: Kela Millin on 11/29/2018 16:42:30 -------------------------------------------------------------------------------- Encounter Discharge Information Details Patient Name: Date of Service: Abigail Taylor 11/29/2018 2:45 PM Medical Record Number:[id number] Patient Account Number: [id number] Date of Birth/Sex: Treating RN: November 18, 1931 (85 y.o. Female) Deon Pilling Primary Care Fatou Dunnigan: [name] Other Clinician: Referring Mariette Cowley: Treating Jovonni Borquez/Extender:Robson, Gillermina Phy, Sara Chu in Treatment: 0 Encounter Discharge Information Items Post Procedure Vitals Discharge Condition: Stable Temperature (F): 98.6 Ambulatory Status: Walker Pulse (bpm): 63 Discharge Destination: Home Respiratory Rate (breaths/min): 16 Transportation: Private Auto Blood Pressure (mmHg): 164/72 Accompanied By: husband Schedule Follow-up Appointment: Yes Clinical Summary of Care: Electronic Signature(s) Signed: 11/29/2018 5:30:28 PM By: Deon Pilling Entered By: Deon Pilling on 11/29/2018 17:30:16 -------------------------------------------------------------------------------- Multi Wound Chart Details Patient Name: Date of Service: Abigail Taylor 11/29/2018 2:45 PM Medical Record [id number] Patient Account Number: [id number] Date of Birth/Sex: Treating RN: 1931/06/30 (85 y.o. Female) Deon Pilling Primary Care Nirel Babler: [name] Other Clinician: Referring Zurii Hewes: Treating Azayla Polo/Extender:Robson, Gillermina Phy, Sara Chu in  Treatment: 0 Vital Signs Height(in): 66 Pulse(bpm): 63 Weight(lbs): 120 Blood Pressure(mmHg): 164/72 Body Mass Index(BMI): 19 Temperature(F): 98.6 Respiratory 16 Rate(breaths/min): Photos: [1:No Photos] [2:No Photos] [N/A:N/A] Wound Location: [1:Sacrum] [2:Right Malleolus - Lateral] [N/A:N/A] Wounding Event: [1:Pressure Injury] [2:Blister] [N/A:N/A] Primary Etiology: [1:Pressure Ulcer] [2:Pressure Ulcer] [N/A:N/A] Comorbid History: [1:Middle ear problems, Arrhythmia, Hypotension, Osteoarthritis] [2:Middle ear problems, Arrhythmia, Hypotension, Osteoarthritis] [N/A:N/A] Date Acquired: [1:09/25/2018] [2:11/23/2018] [N/A:N/A] Weeks of Treatment: [1:0] [2:0] [N/A:N/A] Wound Status: [1:Open] [2:Open] [N/A:N/A] Measurements L x W x D 1.8x0.6x0.1 [2:1x1x0.1] [N/A:N/A] (cm) Area (cm) : [1:0.848] [id number] [N/A:N/A] Volume (cm) : [1:0.085] [2:0.079] [N/A:N/A] Classification: [1:Category/Stage III] [2:Category/Stage II] [N/A:N/A] Exudate Amount: [1:Medium] [2:Medium] [N/A:N/A] Exudate Type: [1:Serosanguineous] [2:Serous] [N/A:N/A] Exudate Color: [1:red, brown] [2:amber] [N/A:N/A] Wound Margin: [1:Flat and Intact] [2:Distinct, outline attached] [N/A:N/A] Granulation Amount: [1:Large (67-100%)] [2:Large (67-100%)] [N/A:N/A] Granulation Quality: [1:Pink] [2:Pink] [N/A:N/A] Necrotic Amount: [1:Small (1-33%)] [2:None Present (0%)] [N/A:N/A] Exposed Structures: [1:Fat Layer (Subcutaneous Tissue) Exposed: Yes Fascia: No Tendon: No Muscle: No Joint: No Bone: No] [2:Fat Layer (Subcutaneous Tissue) Exposed: Yes Fascia: No Tendon: No Muscle: No Joint: No Bone: No] [N/A:N/A] Epithelialization: [1:None] [2:None] [N/A:N/A] Debridement: [1:Debridement - Excisional] [2:N/A] [N/A:N/A] Pre-procedure [1:16:25] [2:N/A] [N/A:N/A] Verification/Time Out Taken: Pain Control: [1:Other] [2:N/A] [N/A:N/A] Tissue Debrided: [1:Subcutaneous, Slough] [2:N/A] [N/A:N/A] Level: [1:Skin/Subcutaneous Tissue] [2:N/A]  [N/A:N/A] Debridement Area (sq cm):1.08 [2:N/A] [N/A:N/A] Instrument: [1:Curette] [2:N/A] [N/A:N/A] Bleeding: [1:Minimum] [2:N/A] [N/A:N/A] Hemostasis Achieved: [1:Pressure] [2:N/A] [N/A:N/A] Procedural Pain: [1:1] [2:N/A] [N/A:N/A] Post Procedural Pain: [1:0] [2:N/A] [N/A:N/A] Debridement Treatment Procedure was tolerated [2:N/A] [N/A:N/A] Response: [1:well] Post Debridement [1:1.8x0.6x0.1] [2:N/A] [N/A:N/A] Measurements L x W x D (cm) Post Debridement [1:0.085] [2:N/A] [N/A:N/A] Volume: (cm) Post Debridement Stage: Category/Stage III [2:N/A N/A] [N/A:N/A N/A] Treatment Notes Electronic Signature(s) Signed: 11/29/2018 5:30:28 PM By: Deon Pilling Signed: 11/29/2018 5:45:12 PM By: Linton Ham MD Entered By: Linton Ham on 11/29/2018 17:17:24 -------------------------------------------------------------------------------- Multi-Disciplinary Care Plan Details Patient Name: Date of Service: Abigail Taylor 11/29/2018 2:45 PM Medical Record [id number] Patient Account Number: [id number] Date of Birth/Sex: Treating RN: 26-Jun-1931 (85 y.o. Female) Kela Millin Primary Care Tyna Huertas: [name] Other Clinician: Referring Linsie Lupo: Treating Betzaira Mentel/Extender:Robson, Gillermina Phy, Sara Chu in Treatment: 0 Active Inactive Nutrition Nursing Diagnoses: Potential for alteratiion in Nutrition/Potential for imbalanced nutrition Goals: Patient/caregiver agrees to and verbalizes understanding of need to use nutritional supplements and/or vitamins as prescribed Date Initiated: 11/29/2018 Target Resolution Date: 12/28/2018 Goal Status: Active Interventions: Provide education on nutrition Notes: Pressure Nursing  Diagnoses: Potential for impaired tissue integrity related to pressure, friction, moisture, and shear Goals: Patient/caregiver will verbalize understanding of pressure ulcer management Date Initiated: 11/29/2018 Target Resolution Date: 12/28/2018 Goal  Status: Active Interventions: Provide education on pressure ulcers Notes: Electronic Signature(s) Signed: 11/29/2018 5:16:49 PM By: Kela Millin Entered By: Kela Millin on 11/29/2018 16:27:36 -------------------------------------------------------------------------------- Pain Assessment Details Patient Name: Date of Service: Abigail Taylor, Abigail Taylor 11/29/2018 2:45 PM Medical Record Number:[id number] Patient Account Number: [id number] Date of Birth/Sex: Treating RN: 1931-02-25 (85 y.o. Female) Levan Hurst Primary Care Zakari Bathe: [name] Other Clinician: Referring Debbi Strandberg: Treating Linder Prajapati/Extender:Robson, Gillermina Phy, Sara Chu in Treatment: 0 Active Problems Location of Pain Severity and Description of Pain Patient Has Paino No Site Locations Pain Management and Medication Current Pain Management: Electronic Signature(s) Signed: 12/03/2018 5:46:53 PM By: Levan Hurst RN, BSN Entered By: Levan Hurst on 11/29/2018 16:07:48 -------------------------------------------------------------------------------- Patient/Caregiver Education Details Patient Name: Abigail Taylor 11/5/2020andnbsp2:45 Date of Service: PM Medical Record [id number] Number: Patient Account Number: [id number] Treating RN: Date of Birth/Gender: 25-Jan-1931 (85 y.o. Kela Millin Female) Other Clinician: Primary Care Treating Fabio Neighbors Physician: Physician/Extender: Referring Physician: Rolm Bookbinder in Treatment: 0 Education Assessment Education Provided To: Patient and Caregiver Education Topics Provided Nutrition: Handouts: Nutrition Methods: Explain/Verbal Responses: State content correctly Pressure: Handouts: Pressure Ulcers: Care and Offloading, Preventing Pressure Ulcers Methods: Explain/Verbal Responses: State content correctly Electronic Signature(s) Signed: 11/29/2018 5:16:49 PM By: Kela Millin Entered By: Kela Millin on 11/29/2018 16:41:07 -------------------------------------------------------------------------------- Wound Assessment Details Patient Name: Date of Service: Abigail Taylor, Abigail Taylor 11/29/2018 2:45 PM Medical Record [id number] Patient Account Number: [id number] Date of Birth/Sex: Treating RN: October 15, 1931 (85 y.o. Female) Levan Hurst Primary Care Cain Fitzhenry: [name] Other Clinician: Referring Dianah Pruett: Treating Dynasty Holquin/Extender:Robson, Gillermina Phy, Sara Chu in Treatment: 0 Wound Status Wound Number: 1 Primary Pressure Ulcer Etiology: Wound Location: Sacrum Wound Open Wounding Event: Pressure Injury Status: Date Acquired: 09/25/2018 Comorbid Middle ear problems, Arrhythmia, Weeks Of Treatment: 0 History: Hypotension, Osteoarthritis Clustered Wound: No Photos Wound Measurements Length: (cm) 1.8 % Reduction in Ar Width: (cm) 0.6 % Reduction in Vo Depth: (cm) 0.1 Epithelialization Area: (cm) 0.848 Tunneling: Volume: (cm) 0.085 Undermining: Wound Description Classification: Category/Stage III Foul Odor After C Wound Margin: Flat and Intact Slough/Fibrino Exudate Amount: Medium Exudate Type: Serosanguineous Exudate Color: red, brown Wound Bed Granulation Amount: Large (67-100%) Granulation Quality: Pink Fascia Exposed: Necrotic Amount: Small (1-33%) Fat Layer (Subcut Necrotic Quality: Adherent Slough Tendon Exposed: Muscle Exposed: Joint Exposed: Bone Exposed: leansing: No Yes Exposed Structure No aneous Tissue) Exposed: Yes No No No No ea: 0% lume: 0% : None No No Treatment Notes Wound #1 (Sacrum) 1. Cleanse With Wound Cleanser 2. Periwound Care Skin Prep 3. Primary Dressing Applied Collegen AG 4. Secondary Dressing Foam Border Dressing 5. Secured With Self Adhesive Bandage Notes explained the orders, frequency of change, how dressing works, and when to return to wound center. Electronic Signature(s) Signed: 12/03/2018  4:06:15 PM By: Mikeal Hawthorne EMT/HBOT Signed: 12/03/2018 5:46:53 PM By: Levan Hurst RN, BSN Entered By: Mikeal Hawthorne on 11/30/2018 12:23:29 -------------------------------------------------------------------------------- Wound Assessment Details Patient Name: Date of Service: Abigail Taylor, Abigail Taylor 11/29/2018 2:45 PM Medical Record [id number] Patient Account Number: [id number] Date of Birth/Sex: Treating RN: Jul 17, 1931 (85 y.o. Female) Kela Millin Primary Care Jarmal Lewelling: [name] Other Clinician: Referring Billey Wojciak: Treating Tara Wich/Extender:Robson, Gillermina Phy, Sara Chu in Treatment: 0 Wound Status Wound Number: 2 Primary Pressure Ulcer Etiology: Wound Location: Right Malleolus - Lateral Wound Open Wounding Event: Blister Status: Date Acquired: 11/23/2018 Comorbid  Middle ear problems, Arrhythmia, Weeks Of Treatment: 0 History: Hypotension, Osteoarthritis Clustered Wound: No Wound Measurements Length: (cm) 1 Width: (cm) 1 Depth: (cm) 0.1 Area: (cm) 0.785 Volume: (cm) 0.079 Wound Description Classification: Category/Stage II Wound Margin: Distinct, outline attached Exudate Amount: Medium Exudate Type: Serous Exudate Color: amber Wound Bed Granulation Amount: Large (67-100%) Granulation Quality: Pink Necrotic Amount: None Present (0%) or After Cleansing: No Fibrino No Exposed Structure Exposed: No er (Subcutaneous Tissue) Exposed: Yes Exposed: No Exposed: No xposed: No posed: No % Reduction in Area: % Reduction in Volume: Epithelialization: None Tunneling: No Undermining: No Foul Od Slough/ Fascia Fat Lay Tendon Muscle Joint E Bone Ex Treatment Notes Wound #2 (Right, Lateral Malleolus) 1. Cleanse With Wound Cleanser 2. Periwound Care Skin Prep 3. Primary Dressing Applied Other primary dressing (specifiy in notes) 5. Secured With Self Adhesive Bandage Notes bordered foam applied as primary. Electronic  Signature(s) Signed: 11/29/2018 5:16:49 PM By: Kela Millin Entered By: Kela Millin on 11/29/2018 16:38:28 -------------------------------------------------------------------------------- Vitals Details Patient Name: Date of Service: Abigail Taylor 11/29/2018 2:45 PM Medical Record Number:[id number] Patient Account Number: [id number] Date of Birth/Sex: Treating RN: 07-08-1931 (85 y.o. Female) Deon Pilling Primary Care Nevyn Bossman: [name] Other Clinician: Referring Gibril Mastro: Treating Eloise Picone/Extender:Robson, Gillermina Phy, Sara Chu in Treatment: 0 Vital Signs Time Taken: 15:33 Temperature (F): 98.6 Height (in): 66 Pulse (bpm): 63 Source: Stated Respiratory Rate (breaths/min): 16 Weight (lbs): 120 Blood Pressure (mmHg): 164/72 Source: Stated Reference Range: 80 - 120 mg / dl Body Mass Index (BMI): 19.4 Electronic Signature(s) Signed: 12/03/2018 5:46:53 PM By: Levan Hurst RN, BSN Entered By: Levan Hurst on 11/29/2018 15:51:52

## 2018-12-03 NOTE — Progress Notes (Signed)
Abigail Taylor, Abigail Taylor ([id number]) Visit Report for 11/29/2018 Chief Complaint Document Details Patient Name: Date of Service: Abigail Taylor, Abigail Taylor 11/29/2018 2:45 PM Medical Record Number:[id number] Patient Account Number: [id number] Date of Birth/Sex: Treating RN: Oct 13, 1931 (85 y.o. Female) Deon Pilling Primary Care Provider: [name] Other Clinician: Referring Provider: Treating Provider/Extender:Zarian Colpitts, Gillermina Phy, Sara Chu in Treatment: 0 Information Obtained from: Patient Chief Complaint 11/29/2018; patient is here for review of his sacral pressure ulcer also a pressure ulcer on her right ankle Electronic Signature(s) Signed: 11/29/2018 5:45:12 PM By: Linton Ham MD Entered By: Linton Ham on 11/29/2018 17:18:51 -------------------------------------------------------------------------------- Debridement Details Patient Name: Date of Service: Abigail Taylor 11/29/2018 2:45 PM Medical Record [id number] Patient Account Number: [id number] Date of Birth/Sex: [birth year] (85 y.o. Female) Treating RN: Deon Pilling Primary Care Provider: [name] Other Clinician: Referring Provider: Treating Provider/Extender:Finlee Concepcion, Gillermina Phy, Sara Chu in Treatment: 0 Debridement Performed for Wound #1 Sacrum Assessment: Performed By: Physician Ricard Dillon., MD Debridement Type: Debridement Level of Consciousness (Pre- Awake and Alert procedure): Pre-procedure Verification/Time Out Taken: Yes - 16:25 Start Time: 16:25 Pain Control: Other : bezocaine, 20% Total Area Debrided (L x W): 1.8 (cm) x 0.6 (cm) = 1.08 (cm) Tissue and other material Viable, Non-Viable, Slough, Subcutaneous, Skin: Dermis , Slough debrided: Level: Skin/Subcutaneous Tissue Debridement Description: Excisional Instrument: Curette Bleeding: Minimum Hemostasis Achieved: Pressure End Time: 16:27 Procedural Pain: 1 Post Procedural Pain: 0 Response to Treatment: Procedure  was tolerated well Level of Consciousness Awake and Alert (Post-procedure): Post Debridement Measurements of Total Wound Length: (cm) 1.8 Stage: Category/Stage III Width: (cm) 0.6 Depth: (cm) 0.1 Volume: (cm) 0.085 Character of Wound/Ulcer Post Improved Debridement: Post Procedure Diagnosis Same as Pre-procedure Electronic Signature(s) Signed: 11/29/2018 5:30:28 PM By: Deon Pilling Signed: 11/29/2018 5:45:12 PM By: Linton Ham MD Previous Signature: 11/29/2018 5:16:49 PM Version By: Kela Millin Entered By: Linton Ham on 11/29/2018 17:17:38 -------------------------------------------------------------------------------- HPI Details Patient Name: Date of Service: Abigail Taylor 11/29/2018 2:45 PM Medical Record [id number] Patient Account Number: [id number] Date of Birth/Sex: Treating RN: 24-Aug-1931 (85 y.o. Female) Deon Pilling Primary Care Provider: [name] Other Clinician: Referring Provider: Treating Provider/Extender:Emmelyn Schmale, Gillermina Phy, Sara Chu in Treatment: 0 History of Present Illness HPI Description: ADMISSION 11/29/2018 This is an 85 year old woman with Parkinson's disease who arrives in clinic accompanied by her husband. They are here for review of wound on the sacrum. There is very little about this in [place] link although it is referenced in a [facility] communication with I think home care. She is apparently in the hospital in September with cardiac related issues it was just after this that somebody noticed the ulcer. They have been using calcium alginate on the wound and they have [facility] home [facility]. They do not have an offloading surface. It is really unclear if the wound is making progression or not. Just yesterday they also noticed a blister on the right lateral malleolus. No doubt this is a pressure related area as well. Past medical history includes atrial fibrillation, tachybradycardia syndrome, syncope, Parkinson's  disease and PAD. ABIs in our clinic were not done Electronic Signature(s) Signed: 11/29/2018 5:45:12 PM By: Linton Ham MD Entered By: Linton Ham on 11/29/2018 17:24:27 -------------------------------------------------------------------------------- Physical Exam Details Patient Name: Date of Service: Abigail Taylor 11/29/2018 2:45 PM Medical Record [id number] Patient Account Number: [id number] Date of Birth/Sex: Treating RN: 12/17/1931 (85 y.o. Female) Deon Pilling Primary Care Provider: [name] Other Clinician: Referring Provider: Treating Provider/Extender:Kanye Depree, Gillermina Phy, Sara Chu in Treatment: 0 Constitutional Patient is  hypertensive.. Pulse regular and within target range for patient.Marland Kitchen Respirations regular, non-labored and within target range.. Temperature is normal and within the target range for the patient.Marland Kitchen Appears in no distress. Somewhat frail-appearing woman noticeable tremor, bradykinesia and cogwheel rigidity noticeable tremor, bradykinesia wound exam. Eyes Conjunctivae clear. No discharge.no icterus. Respiratory work of breathing is normal. Bilateral breath sounds are clear and equal in all lobes with no wheezes, rales or rhonchi.. Cardiovascular Heart rhythm and rate regular, without murmur or gallop.. Peripheral pulses are robust on the right. Gastrointestinal (GI) Abdomen is soft and non-distended without masses or tenderness.. Genitourinary (GU) Bladder not distended. Neurological tremor and cowheeling c/w Parkinsons. Psychiatric appears at normal baseline. Notes Wound exam; the area in question is on the lower sacrum. Small oval-shaped wound to 100% covered in fibrinous debris is some eschar. Using a #5 curette this was removed. Hemostasis with a pressure dressing. Wound cleaned up quite nicely On the right lateral malleolus and intact blister very tense but small. Evacuated with a #15 scalpel. I left the surface  skin intact to see if it would adhere Electronic Signature(s) Signed: 11/29/2018 5:45:12 PM By: Linton Ham MD Entered By: Linton Ham on 11/29/2018 17:32:36 -------------------------------------------------------------------------------- Physician Orders Details Patient Name: Date of Service: Abigail Taylor 11/29/2018 2:45 PM Medical Record [id number] Patient Account Number: [id number] Date of Birth/Sex: Treating RN: October 07, 1931 (85 y.o. Female) Kela Millin Primary Care Provider: [name] Other Clinician: Referring Provider: Treating Provider/Extender:Sanaa Zilberman, Gillermina Phy, Sara Chu in Treatment: 0 Verbal / Phone Orders: No Diagnosis Coding Follow-up Appointments Return Appointment in 1 week. Dressing Change Frequency Change Dressing every other day. - home health to change weekly and all other days husband to change Skin Barriers/Peri-Wound Care Skin Prep Wound Cleansing May shower and wash wound with soap and water. - with dressing changes Primary Wound Dressing Wound #1 Sacrum Silver Collagen - moisten with hydrogel Wound #2 Right,Lateral Malleolus Foam - bordered foam Secondary Dressing Wound #1 Sacrum Foam Border Off-Loading Wound #1 Sacrum Low air-loss mattress (Group 2) - Order from [facility] Turn and reposition every 2 hours [place] skilled nursing for wound care. - home [facility] ([facility] to change dressing weekly and husband to change on other days Electronic Signature(s) Signed: 11/29/2018 5:16:49 PM By: Kela Millin Signed: 11/29/2018 5:45:12 PM By: Linton Ham MD Entered By: Kela Millin on 11/29/2018 16:40:19 -------------------------------------------------------------------------------- Problem List Details Patient Name: Date of Service: Abigail Taylor 11/29/2018 2:45 PM Medical Record [id number] Patient Account Number: [id number] Date of Birth/Sex: Treating  RN: Jun 20, 1931 (85 y.o. Female) Kela Millin Primary Care Provider: [name] Other Clinician: Referring Provider: Treating Provider/Extender:Artie Mcintyre, Gillermina Phy, Sara Chu in Treatment: 0 Active Problems ICD-10 Evaluated Encounter Code Description Active Date Today Diagnosis L89.153 Pressure ulcer of sacral region, stage 3 11/29/2018 No Yes L89.512 Pressure ulcer of right ankle, stage 2 11/29/2018 No Yes Inactive Problems Resolved Problems Electronic Signature(s) Signed: 11/29/2018 5:45:12 PM By: Linton Ham MD Previous Signature: 11/29/2018 5:16:49 PM Version By: Kela Millin Entered By: Linton Ham on 11/29/2018 17:16:58 -------------------------------------------------------------------------------- Progress Note Details Patient Name: Date of Service: Abigail Taylor 11/29/2018 2:45 PM Medical Record [id number] Patient Account Number: [id number] Date of Birth/Sex: Treating RN: April 12, 1931 (85 y.o. Female) Deon Pilling Primary Care Provider: [name] Other Clinician: Referring Provider: Treating Provider/Extender:Michaelangelo Mittelman, Gillermina Phy, Sara Chu in Treatment: 0 Subjective Chief Complaint Information obtained from Patient 11/29/2018; patient is here for review of his sacral pressure ulcer also a pressure ulcer on her right ankle History  of Present Illness (HPI) ADMISSION 11/29/2018 This is an 85 year old woman with Parkinson's disease who arrives in clinic accompanied by her husband. They are here for review of wound on the sacrum. There is very little about this in [place] link although it is referenced in a [facility] communication with I think home care. She is apparently in the hospital in September with cardiac related issues it was just after this that somebody noticed the ulcer. They have been using calcium alginate on the wound and they have advanced home care. They do not have an offloading surface. It is really unclear if  the wound is making progression or not. Just yesterday they also noticed a blister on the right lateral malleolus. No doubt this is a pressure related area as well. Past medical history includes atrial fibrillation, tachybradycardia syndrome, syncope, Parkinson's disease and PAD. ABIs in our clinic were not done Patient History Information obtained from Patient. Allergies No Known Drug Allergies Family History Unknown History. Social History Never smoker, Marital Status - Married, Alcohol Use - Never, Drug Use - No History, Caffeine Use - Never. Medical History Ear/Nose/Mouth/Throat Patient has history of Middle ear problems Cardiovascular Patient has history of Arrhythmia - A-Fib, Hypotension - Orthostatic Musculoskeletal Patient has history of Osteoarthritis Hospitalization/Surgery History [facility] - 09/2018. Medical And Surgical History Notes Cardiovascular Pacemaker, Aortic insufficiency Gastrointestinal Diverticulosis Endocrine Hypothyroidism Neurologic Parkinson's Disease Review of Systems (ROS) Constitutional Symptoms (General Health) Denies complaints or symptoms of Fatigue, Fever, Chills, Marked Weight Change. Eyes Complains or has symptoms of Glasses / Contacts - glasses. Respiratory Denies complaints or symptoms of Chronic or frequent coughs, Shortness of Breath. Gastrointestinal Denies complaints or symptoms of Frequent diarrhea, Nausea, Vomiting. Endocrine Denies complaints or symptoms of Heat/cold intolerance. Genitourinary Denies complaints or symptoms of Frequent urination. Integumentary (Skin) Complains or has symptoms of Wounds - sacral wound. Musculoskeletal Denies complaints or symptoms of Muscle Pain, Muscle Weakness. Psychiatric Denies complaints or symptoms of Claustrophobia, Suicidal. Objective Constitutional Patient is hypertensive.. Pulse regular and within target range for patient.Marland Kitchen Respirations regular, non-labored and within target  range.. Temperature is normal and within the target range for the patient.Marland Kitchen Appears in no distress. Somewhat frail-appearing woman noticeable tremor, bradykinesia and cogwheel rigidity noticeable tremor, bradykinesia wound exam. Vitals Time Taken: 3:33 PM, Height: 66 in, Source: Stated, Weight: 120 lbs, Source: Stated, BMI: 19.4, Temperature: 98.6 F, Pulse: 63 bpm, Respiratory Rate: 16 breaths/min, Blood Pressure: 164/72 mmHg. Eyes Conjunctivae clear. No discharge.no icterus. Respiratory work of breathing is normal. Bilateral breath sounds are clear and equal in all lobes with no wheezes, rales or rhonchi.. Cardiovascular Heart rhythm and rate regular, without murmur or gallop.. Peripheral pulses are robust on the right. Gastrointestinal (GI) Abdomen is soft and non-distended without masses or tenderness.. Genitourinary (GU) Bladder not distended. Neurological tremor and cowheeling c/w Parkinsons. Psychiatric appears at normal baseline. General Notes: Wound exam; the area in question is on the lower sacrum. Small oval-shaped wound to 100% covered in fibrinous debris is some eschar. Using a #5 curette this was removed. Hemostasis with a pressure dressing. Wound cleaned up quite nicely ooOn the right lateral malleolus and intact blister very tense but small. Evacuated with a #15 scalpel. I left the surface skin intact to see if it would adhere Integumentary (Hair, Skin) Wound #1 status is Open. Original cause of wound was Pressure Injury. The wound is located on the Sacrum. The wound measures 1.8cm length x 0.6cm width x 0.1cm depth; 0.848cm^2 area and 0.085cm^3 volume.  There is Fat Layer (Subcutaneous Tissue) Exposed exposed. There is no tunneling or undermining noted. There is a medium amount of serosanguineous drainage noted. The wound margin is flat and intact. There is large (67-100%) pink granulation within the wound bed. There is a small (1-33%) amount of necrotic tissue within  the wound bed including Adherent Slough. Wound #2 status is Open. Original cause of wound was Blister. The wound is located on the Right,Lateral Malleolus. The wound measures 1cm length x 1cm width x 0.1cm depth; 0.785cm^2 area and 0.079cm^3 volume. There is Fat Layer (Subcutaneous Tissue) Exposed exposed. There is no tunneling or undermining noted. There is a medium amount of serous drainage noted. The wound margin is distinct with the outline attached to the wound base. There is large (67-100%) pink granulation within the wound bed. There is no necrotic tissue within the wound bed. Assessment Active Problems ICD-10 Pressure ulcer of sacral region, stage 3 Pressure ulcer of right ankle, stage 2 Procedures Wound #1 Pre-procedure diagnosis of Wound #1 is a Pressure Ulcer located on the Sacrum . There was a Excisional Skin/Subcutaneous Tissue Debridement with a total area of 1.08 sq cm performed by Ricard Dillon., MD. With the following instrument(s): Curette to remove Viable and Non-Viable tissue/material. Material removed includes Subcutaneous Tissue, Slough, and Skin: Dermis after achieving pain control using Other (bezocaine, 20%). No specimens were taken. A time out was conducted at 16:25, prior to the start of the procedure. A Minimum amount of bleeding was controlled with Pressure. The procedure was tolerated well with a pain level of 1 throughout and a pain level of 0 following the procedure. Post Debridement Measurements: 1.8cm length x 0.6cm width x 0.1cm depth; 0.085cm^3 volume. Post debridement Stage noted as Category/Stage III. Character of Wound/Ulcer Post Debridement is improved. Post procedure Diagnosis Wound #1: Same as Pre-Procedure Plan Follow-up Appointments: Return Appointment in 1 week. Dressing Change Frequency: Change Dressing every other day. - home health to change weekly and all other days husband to change Skin Barriers/Peri-Wound Care: Skin Prep Wound  Cleansing: May shower and wash wound with soap and water. - with dressing changes Primary Wound Dressing: Wound #1 Sacrum: Silver Collagen - moisten with hydrogel Wound #2 Right,Lateral Malleolus: Foam - bordered foam Secondary Dressing: Wound #1 Sacrum: Foam Border Off-Loading: Wound #1 Sacrum: Low air-loss mattress (Group 2) - Order from [facility] Turn and reposition every 2 hours Home Health: [facility] skilled nursing for wound care. - home health ([facility]) to change dressing weekly and husband to change on other days #1 we use silver collagen border foam to the area on the lower sacrum after debridement 2. I simply put a foam over the lateral malleolus on the right. See if that can skin there will remain intact. 3. She had vibrant peripheral pulses I do not think there is an arterial issue Electronic Signature(s) Signed: 11/29/2018 5:33:29 PM By: Linton Ham MD Entered By: Linton Ham on 11/29/2018 17:33:29 -------------------------------------------------------------------------------- HxROS Details Patient Name: Date of Service: Abigail Taylor 11/29/2018 2:45 PM Medical Record [id number] Patient Account Number: [id number] Date of Birth/Sex: Treating RN: [birth year] (85 y.o. Female) Levan Hurst Primary Care Provider: [name] Other Clinician: Referring Provider: Treating Provider/Extender:Melora Menon, Gillermina Phy, Sara Chu in Treatment: 0 Information Obtained From Patient Constitutional Symptoms (General Health) Complaints and Symptoms: Negative for: Fatigue; Fever; Chills; Marked Weight Change Eyes Complaints and Symptoms: Positive for: Glasses / Contacts - glasses Respiratory Complaints and Symptoms: Negative for: Chronic or frequent coughs; Shortness of  Breath Gastrointestinal Complaints and Symptoms: Negative for: Frequent diarrhea; Nausea; Vomiting Medical History: Past Medical History  Notes: Diverticulosis Endocrine Complaints and Symptoms: Negative for: Heat/cold intolerance Medical History: Past Medical History Notes: Hypothyroidism Genitourinary Complaints and Symptoms: Negative for: Frequent urination Integumentary (Skin) Complaints and Symptoms: Positive for: Wounds - sacral wound Musculoskeletal Complaints and Symptoms: Negative for: Muscle Pain; Muscle Weakness Medical History: Positive for: Osteoarthritis Psychiatric Complaints and Symptoms: Negative for: Claustrophobia; Suicidal Ear/Nose/Mouth/Throat Medical History: Positive for: Middle ear problems Hematologic/Lymphatic Cardiovascular Medical History: Positive for: Arrhythmia - A-Fib; Hypotension - Orthostatic Past Medical History Notes: Pacemaker, Aortic insufficiency Immunological Neurologic Medical History: Past Medical History Notes: Parkinson's Disease Oncologic HBO Extended History Items Ear/Nose/Mouth/Throat: Middle ear problems Immunizations Pneumococcal Vaccine: Received Pneumococcal Vaccination: Yes Implantable Devices Yes Hospitalization / Surgery History Type of Hospitalization/Surgery [facility] - 09/2018 Family and Social History Unknown History: Yes; Never smoker; Marital Status - Married; Alcohol Use: Never; Drug Use: No History; Caffeine Use: Never; Financial Concerns: No; Food, Clothing or Shelter Needs: No; Support System Lacking: No; Transportation Concerns: No Electronic Signature(s) Signed: 11/29/2018 5:45:12 PM By: Linton Ham MD Signed: 12/03/2018 5:46:53 PM By: Levan Hurst RN, BSN Entered By: Levan Hurst on 11/29/2018 16:00:57 -------------------------------------------------------------------------------- [place] Details Patient Name: Date of Service: Abigail Taylor 11/29/2018 Medical Record Number:[id number] Patient Account Number: [id number] Date of Birth/Sex: Treating RN: 1931/02/15 (85 y.o. Female) Kela Millin Primary Care  Provider: [name] Other Clinician: Referring Provider: Treating Provider/Extender:Fredrico Beedle, Gillermina Phy, Sara Chu in Treatment: 0 Diagnosis Coding ICD-10 Codes Code Description [id number] Pressure ulcer of sacral region, stage 3 Facility Procedures The patient participates with Medicare or their insurance follows the Medicare Facility Guidelines: CPT4 Code Description Modifier Quantity [id number] [place] VISIT-LEV 4 EST PT 25 1 The patient participates with Medicare or their insurance follows the Medicare Facility Guidelines: [id number] [place] TISSUE 20 SQ CM/< 1 ICD-10 Diagnosis Description L89.153 Pressure ulcer of sacral region, stage 3 Physician Procedures CPT4 Code: [id number] Description: [id number] - WC PHYS SUBQ TISS 20 SQ CM ICD-10 Diagnosis Description L89.153 Pressure ulcer of sacral region, stage 3 Modifier: Quantity: 1 Electronic Signature(s) Signed: 11/29/2018 5:45:12 PM By: Linton Ham MD Previous Signature: 11/29/2018 5:30:28 PM Version By: Deon Pilling Previous Signature: 11/29/2018 5:16:49 PM Version By: Kela Millin Entered By: Linton Ham on 11/29/2018 17:33:40

## 2018-12-03 NOTE — Progress Notes (Signed)
Abigail, Taylor ([id number]) Visit Report for 11/29/2018 Abuse/Suicide Risk Screen Details Patient Name: Date of Service: Abigail Taylor, Abigail Taylor 11/29/2018 2:45 PM Medical Record Number:[id number] Patient Account Number: [id number] Date of Birth/Sex: Treating RN: 1931-01-25 (85 y.o. Female) Levan Hurst Primary Care Burl Tauzin: [name] Other Clinician: Referring Marria Mathison: Treating Alexiz Sustaita/Extender:Robson, Gillermina Phy, Sara Chu in Treatment: 0 Abuse/Suicide Risk Screen Items Answer ABUSE RISK SCREEN: Has anyone close to you tried to hurt or harm you recentlyo No Do you feel uncomfortable with anyone in your familyo No Has anyone forced you do things that you didnt want to doo No Electronic Signature(s) Signed: 12/03/2018 5:46:53 PM By: Levan Hurst RN, BSN Entered By: Levan Hurst on 11/29/2018 16:01:03 -------------------------------------------------------------------------------- Activities of Daily Living Details Patient Name: Date of Service: [name], [name] 11/29/2018 2:45 PM Medical Record Number:[id number] Patient Account Number: [id number] Date of Birth/Sex: Treating RN: 1931-05-10 (85 y.o. Female) Levan Hurst Primary Care Deola Rewis: [name] Other Clinician: Referring Monnie Gudgel: Treating Marvens Hollars/Extender:Robson, Gillermina Phy, Sara Chu in Treatment: 0 Activities of Daily Living Items Answer Activities of Daily Living (Please select one for each item) Drive Automobile Not Able Take Medications Need Assistance Use Telephone Completely Able Care for Appearance Completely Able Use Toilet Completely Able Bath / Shower Completely Able Dress Self Completely Able Feed Self Completely Able Walk Need Assistance Get In / Out Bed Completely [place] Need Assistance Shop for Self Need Assistance Electronic Signature(s) Signed: 12/03/2018 5:46:53 PM By: Levan Hurst RN,  BSN Entered By: Levan Hurst on 11/29/2018 16:02:51 -------------------------------------------------------------------------------- Education Screening Details Patient Name: Date of Service: Abigail Taylor 11/29/2018 2:45 PM Medical Record [id number] Patient Account Number: [id number] Date of Birth/Sex: Treating RN: Jul 20, 1931 (85 y.o. Female) Levan Hurst Primary Care Shekela Goodridge: [name] Other Clinician: Referring Korri Ask: Treating Margretta Zamorano/Extender:Robson, Gillermina Phy, Sara Chu in Treatment: 0 Primary Learner Assessed: Patient Learning Preferences/Education Level/Primary Language Learning Preference: Explanation, Demonstration, Printed Material Highest Education Level: High School Preferred Language: English Cognitive Barrier Language Barrier: No Translator Needed: No Memory Deficit: No Emotional Barrier: No Cultural/Religious Beliefs Affecting Medical Care: No Physical Barrier Impaired Vision: No Impaired Hearing: No Decreased Hand dexterity: No Knowledge/Comprehension Knowledge Level: High Comprehension Level: High Ability to understand written High instructions: Ability to understand verbal High instructions: Motivation Anxiety Level: Calm Cooperation: Cooperative Education Importance: Acknowledges Need Interest in Health Problems: Asks Questions Perception: Coherent Willingness to Engage in Self- High Management Activities: Readiness to Engage in Self- High Management Activities: Electronic Signature(s) Signed: 12/03/2018 5:46:53 PM By: Levan Hurst RN, BSN Entered By: Levan Hurst on 11/29/2018 16:03:12 -------------------------------------------------------------------------------- Fall Risk Assessment Details Patient Name: Date of Service: Abigail Taylor 11/29/2018 2:45 PM Medical Record [id number] Patient Account Number: [id number] Date of Birth/Sex: Treating RN: 1931/10/23 (85 y.o. Female) Levan Hurst Primary Care Rylin Seavey: [name] Other Clinician: Referring Cybil Senegal: Treating Syrina Wake/Extender:Robson, Gillermina Phy, Sara Chu in Treatment: 0 Fall Risk Assessment Items Have you had 2 or more falls in the last 12 monthso 0 Yes Have you had any fall that resulted in injury in the last 12 monthso 0 No FALLS RISK SCREEN History of falling - immediate or within 3 months 0 No Secondary diagnosis (Do you have 2 or more medical diagnoseso) 15 Yes Ambulatory aid None/bed rest/wheelchair/nurse 0 No Crutches/cane/walker 15 Yes Furniture 0 No Intravenous therapy Access/Saline/Heparin Lock 0 No Weak (short steps with or without shuffle, stooped but able to lift head 10 Yes while walking, may seek support from furniture) Impaired (short  steps with shuffle, may have difficulty arising from chair, 0 No head down, impaired balance) Mental Status Oriented to own ability 0 Yes Overestimates or forgets limitations 0 No Risk Level: Medium Risk Score: 40 Electronic Signature(s) Signed: 12/03/2018 5:46:53 PM By: Levan Hurst RN, BSN Entered By: Levan Hurst on 11/29/2018 16:03:31 -------------------------------------------------------------------------------- Nutrition Risk Screening Details Patient Name: Date of Service: [name], Taylor 11/29/2018 2:45 PM Medical Record [id number] Patient Account Number: [id number] Date of Birth/Sex: Treating RN: 1932-01-01 (85 y.o. Female) Levan Hurst Primary Care Carely Nappier: [name] Other Clinician: Referring Ellis Koffler: Treating Rheannon Cerney/Extender:Robson, Gillermina Phy, Sara Chu in Treatment: 0 Height (in): 66 Weight (lbs): 120 Body Mass Index (BMI): 19.4 Nutrition Risk Screening Items Score Screening NUTRITION RISK SCREEN: I have an illness or condition that made me change the kind and/or 0 No amount of food I eat I eat fewer than two meals per day 0 No I eat few fruits and vegetables, or milk products 0  No I have three or more drinks of beer, liquor or wine almost every day 0 No I have tooth or mouth problems that make it hard for me to eat 0 No I don't always have enough money to buy the food I need 0 No I eat alone most of the time 0 No I take three or more different prescribed or over-the-counter drugs a day 1 Yes 0 No Without wanting to, I have lost or gained 10 pounds in the last six months I am not always physically able to shop, cook and/or feed myself 2 Yes Nutrition Protocols Good Risk Protocol Provide education on Moderate Risk Protocol 0 nutrition High Risk Proctocol Risk Level: Moderate Risk Score: 3 Electronic Signature(s) Signed: 12/03/2018 5:46:53 PM By: Levan Hurst RN, BSN Entered By: Levan Hurst on 11/29/2018 16:04:10

## 2018-12-04 DIAGNOSIS — Z822 Family history of deafness and hearing loss: Secondary | ICD-10-CM | POA: Diagnosis not present

## 2018-12-04 DIAGNOSIS — H90A31 Mixed conductive and sensorineural hearing loss, unilateral, right ear with restricted hearing on the contralateral side: Secondary | ICD-10-CM | POA: Diagnosis not present

## 2018-12-04 DIAGNOSIS — H90A22 Sensorineural hearing loss, unilateral, left ear, with restricted hearing on the contralateral side: Secondary | ICD-10-CM | POA: Diagnosis not present

## 2018-12-04 NOTE — Telephone Encounter (Signed)
This encounter was created in error - please disregard.

## 2018-12-05 DIAGNOSIS — G903 Multi-system degeneration of the autonomic nervous system: Secondary | ICD-10-CM | POA: Diagnosis not present

## 2018-12-05 DIAGNOSIS — L89150 Pressure ulcer of sacral region, unstageable: Secondary | ICD-10-CM | POA: Diagnosis not present

## 2018-12-05 DIAGNOSIS — I209 Angina pectoris, unspecified: Secondary | ICD-10-CM | POA: Diagnosis not present

## 2018-12-05 DIAGNOSIS — I4819 Other persistent atrial fibrillation: Secondary | ICD-10-CM | POA: Diagnosis not present

## 2018-12-05 DIAGNOSIS — G20 Parkinson's disease: Secondary | ICD-10-CM | POA: Diagnosis not present

## 2018-12-05 DIAGNOSIS — S51812D Laceration without foreign body of left forearm, subsequent encounter: Secondary | ICD-10-CM | POA: Diagnosis not present

## 2018-12-06 ENCOUNTER — Other Ambulatory Visit: Payer: Self-pay

## 2018-12-06 ENCOUNTER — Encounter (HOSPITAL_BASED_OUTPATIENT_CLINIC_OR_DEPARTMENT_OTHER): Payer: Medicare Other | Admitting: Internal Medicine

## 2018-12-06 DIAGNOSIS — I739 Peripheral vascular disease, unspecified: Secondary | ICD-10-CM | POA: Diagnosis not present

## 2018-12-06 DIAGNOSIS — L89512 Pressure ulcer of right ankle, stage 2: Secondary | ICD-10-CM | POA: Diagnosis not present

## 2018-12-06 DIAGNOSIS — Z950 Presence of cardiac pacemaker: Secondary | ICD-10-CM | POA: Diagnosis not present

## 2018-12-06 DIAGNOSIS — G20 Parkinson's disease: Secondary | ICD-10-CM | POA: Diagnosis not present

## 2018-12-06 DIAGNOSIS — I495 Sick sinus syndrome: Secondary | ICD-10-CM | POA: Diagnosis not present

## 2018-12-06 DIAGNOSIS — L89153 Pressure ulcer of sacral region, stage 3: Secondary | ICD-10-CM | POA: Diagnosis not present

## 2018-12-06 NOTE — Progress Notes (Signed)
BREXLEE, MISKIEWICZ ([id number]) Visit Report for 12/06/2018 Debridement Details Patient Name: Date of Service: Abigail Taylor, Abigail Taylor 12/06/2018 12:30 PM Medical Record Number:[id number] Patient Account Number: [id number] Date of Birth/Sex: 1931-04-25 (85 y.o. F) Treating RN: Deon Pilling Primary Care Provider: [name] Other Clinician: Referring Provider: Treating Provider/Extender:Aries Kasa, Gillermina Phy, Sara Chu in Treatment: 1 Debridement Performed for Wound #1 Sacrum Assessment: Performed By: Physician Ricard Dillon., MD Debridement Type: Debridement Level of Consciousness (Pre- Awake and Alert procedure): Pre-procedure Yes - 13:25 Verification/Time Out Taken: Start Time: 13:26 Pain Control: Lidocaine 4% Topical Solution Total Area Debrided (L x W): 1.9 (cm) x 0.5 (cm) = 0.95 (cm) Tissue and other material Viable, Non-Viable, Slough, Subcutaneous, Skin: Dermis , Fibrin/Exudate, Slough debrided: Level: Skin/Subcutaneous Tissue Debridement Description: Excisional Instrument: Curette Bleeding: Minimum Hemostasis Achieved: Pressure End Time: 13:28 Procedural Pain: 0 Post Procedural Pain: 0 Response to Treatment: Procedure was tolerated well Level of Consciousness Awake and Alert (Post-procedure): Post Debridement Measurements of Total Wound Length: (cm) 1.9 Stage: Category/Stage III Width: (cm) 0.5 Depth: (cm) 0.2 Volume: (cm) 0.149 Character of Wound/Ulcer Post Improved Debridement: Post Procedure Diagnosis Same as Pre-procedure Electronic Signature(s) Signed: 12/06/2018 5:17:30 PM By: Deon Pilling Signed: 12/06/2018 5:47:05 PM By: Linton Ham MD Entered By: Linton Ham on 12/06/2018 13:45:52 -------------------------------------------------------------------------------- HPI Details Patient Name: Date of Service: Abigail Taylor. 12/06/2018 12:30 PM Medical Record [id number] Patient Account Number: [id number] Date of  Birth/Sex: Treating RN: Jan 26, 1931 (85 y.o. [name] Primary Care Provider: [name] Other Clinician: Referring Provider: Treating Provider/Extender:Johan Antonacci, Gillermina Phy, Sara Chu in Treatment: 1 History of Present Illness HPI Description: ADMISSION 11/29/2018 This is an 85 year old woman with Parkinson's disease who arrives in clinic accompanied by her husband. They are here for review of wound on the sacrum. There is very little about this in [place] link although it is referenced in a [facility] communication with I think home care. She is apparently in the hospital in September with cardiac related issues it was just after this that somebody noticed the ulcer. They have been using calcium alginate on the wound and they have [facility] home [facility]. They do not have an offloading surface. It is really unclear if the wound is making progression or not. Just yesterday they also noticed a blister on the right lateral malleolus. No doubt this is a pressure related area as well. Past medical history includes atrial fibrillation, tachybradycardia syndrome, syncope, Parkinson's disease and PAD. ABIs in our clinic were not done 11/12; patient's area on the right lateral malleolus seems to have epithelialized over. She has a small oval-shaped wound but it is in a difficult situation right over her coccyx. We have been using silver collagen. She has home care. She claims to be aggressively offloading this area Electronic Signature(s) Signed: 12/06/2018 5:47:05 PM By: Linton Ham MD Entered By: Linton Ham on 12/06/2018 13:47:12 -------------------------------------------------------------------------------- Physical Exam Details Patient Name: Date of Service: Abigail Taylor 12/06/2018 12:30 PM Medical Record Number:[id number] Patient Account Number: [id number] Date of Birth/Sex: Treating RN: 1931-11-10 (85 y.o. [name] Primary Care Provider: [name] Other Clinician: Referring Provider: Treating Provider/Extender:Amar Sippel, Gillermina Phy, Sara Chu in Treatment: 1 Constitutional Sitting or standing Blood Pressure is within target range for patient.. Pulse regular and within target range for patient.Marland Kitchen Respirations regular, non-labored and within target range.. Temperature is normal and within the target range for the patient.Marland Kitchen Appears in no distress. Notes Wound exam; the area in question is on the lower sacrum coccyx. Small oval-shaped  wound. Still requiring debridement with a #3 curette removing tightly adherent fibrinous debris. Hemostasis was with silver nitrate. There is no evidence of surrounding infection The area on the right lateral malleolus from last week is epithelialized over Electronic Signature(s) Signed: 12/06/2018 5:47:05 PM By: Linton Ham MD Entered By: Linton Ham on 12/06/2018 13:48:00 -------------------------------------------------------------------------------- Physician Orders Details Patient Name: Date of Service: Abigail Taylor. 12/06/2018 12:30 PM Medical Record [id number] Patient Account Number: [id number] Date of Birth/Sex: Treating RN: [birth year] (85 y.o. [name] Primary Care Provider: [name] Other Clinician: Referring Provider: Treating Provider/Extender:Elisandra Deshmukh, Gillermina Phy, Sara Chu in Treatment: 1 Verbal / Phone Orders: No Diagnosis Coding ICD-10 Coding Code Description L89.153 Pressure ulcer of sacral region, stage 3 L89.512 Pressure ulcer of right ankle, stage 2 Follow-up Appointments Return Appointment in 1 week. Dressing Change Frequency Change Dressing every other day. - home health to change weekly and all other days husband to change Skin Barriers/Peri-Wound Care Skin Prep Wound Cleansing May shower and wash wound with soap and water. - with dressing changes Primary Wound Dressing Wound #1 Sacrum Silver Collagen - moisten  with hydrogel Secondary Dressing Wound #1 Sacrum Foam Border Other: - foam border to right lateral ankle for protection. Off-Loading Wound #1 Sacrum Low air-loss mattress (Group 2) - pending insurance approval for group 2 air mattress from [employer]. Turn and reposition every 2 hours [place] skilled nursing for wound care. - home health ([facility]) to change dressing weekly and husband to change on other days Electronic Signature(s) Signed: 12/06/2018 5:17:30 PM By: Deon Pilling Signed: 12/06/2018 5:47:05 PM By: Linton Ham MD Entered By: Deon Pilling on 12/06/2018 13:31:28 -------------------------------------------------------------------------------- Problem List Details Patient Name: Date of Service: YARNELL, DAUGHERTY 12/06/2018 12:30 PM Medical Record [id number] Patient Account Number: [id number] Date of Birth/Sex: Treating RN: 03-Apr-1931 (85 y.o. Helene Shoe, Tammi Klippel Primary Care Provider: [name] Other Clinician: Referring Provider: Treating Provider/Extender:Hennessy Bartel, Gillermina Phy, Sara Chu in Treatment: 1 Active Problems ICD-10 Evaluated Encounter Code Description Active Date Today Diagnosis L89.153 Pressure ulcer of sacral region, stage 3 11/29/2018 No Yes Inactive Problems ICD-10 Code Description Active Date Inactive Date L89.512 Pressure ulcer of right ankle, stage 2 11/29/2018 11/29/2018 Resolved Problems Electronic Signature(s) Signed: 12/06/2018 5:47:05 PM By: Linton Ham MD Entered By: Linton Ham on 12/06/2018 13:45:37 -------------------------------------------------------------------------------- Progress Note Details Patient Name: Date of Service: Abigail Taylor. 12/06/2018 12:30 PM Medical Record [id number] Patient Account Number: [id number] Date of Birth/Sex: Treating RN: 08/19/1931 (85 y.o. [name] Primary Care Provider: [name] Other Clinician: Referring  Provider: Treating Provider/Extender:Laquiesha Piacente, Gillermina Phy, Sara Chu in Treatment: 1 Subjective History of Present Illness (HPI) ADMISSION 11/29/2018 This is an 85 year old woman with Parkinson's disease who arrives in clinic accompanied by her husband. They are here for review of wound on the sacrum. There is very little about this in [place] link although it is referenced in a [facility] communication with I think home care. She is apparently in the hospital in September with cardiac related issues it was just after this that somebody noticed the ulcer. They have been using calcium alginate on the wound and they have advanced home care. They do not have an offloading surface. It is really unclear if the wound is making progression or not. Just yesterday they also noticed a blister on the right lateral malleolus. No doubt this is a pressure related area as well. Past medical history includes atrial fibrillation, tachybradycardia syndrome, syncope, Parkinson's disease and PAD. ABIs in  our clinic were not done 11/12; patient's area on the right lateral malleolus seems to have epithelialized over. She has a small oval-shaped wound but it is in a difficult situation right over her coccyx. We have been using silver collagen. She has home care. She claims to be aggressively offloading this area Objective Constitutional Sitting or standing Blood Pressure is within target range for patient.. Pulse regular and within target range for patient.Marland Kitchen Respirations regular, non-labored and within target range.. Temperature is normal and within the target range for the patient.Marland Kitchen Appears in no distress. Vitals Time Taken: 12:40 PM, Height: 66 in, Weight: 120 lbs, BMI: 19.4, Temperature: 97.8 F, Pulse: 62 bpm, Respiratory Rate: 18 breaths/min, Blood Pressure: 181/71 mmHg. General Notes: Wound exam; the area in question is on the lower sacrum coccyx. Small oval-shaped wound. Still requiring debridement  with a #3 curette removing tightly adherent fibrinous debris. Hemostasis was with silver nitrate. There is no evidence of surrounding infection ooThe area on the right lateral malleolus from last week is epithelialized over Integumentary (Hair, Skin) Wound #1 status is Open. Original cause of wound was Pressure Injury. The wound is located on the Sacrum. The wound measures 1.9cm length x 0.5cm width x 0.2cm depth; 0.746cm^2 area and 0.149cm^3 volume. There is Fat Layer (Subcutaneous Tissue) Exposed exposed. There is no tunneling or undermining noted. There is a medium amount of serosanguineous drainage noted. The wound margin is flat and intact. There is small (1-33%) pink, pale granulation within the wound bed. There is a large (67-100%) amount of necrotic tissue within the wound bed including Adherent Slough. Wound #2 status is Open. Original cause of wound was Blister. The wound is located on the Right,Lateral Malleolus. The wound measures 0cm length x 0cm width x 0cm depth; 0cm^2 area and 0cm^3 volume. There is no tunneling or undermining noted. There is a none present amount of drainage noted. The wound margin is distinct with the outline attached to the wound base. There is no granulation within the wound bed. There is no necrotic tissue within the wound bed. Assessment Active Problems ICD-10 Pressure ulcer of sacral region, stage 3 Procedures Wound #1 Pre-procedure diagnosis of Wound #1 is a Pressure Ulcer located on the Sacrum . There was a Excisional Skin/Subcutaneous Tissue Debridement with a total area of 0.95 sq cm performed by Ricard Dillon., MD. With the following instrument(s): Curette to remove Viable and Non-Viable tissue/material. Material removed includes Subcutaneous Tissue, Slough, Skin: Dermis, and Fibrin/Exudate after achieving pain control using Lidocaine 4% Topical Solution. A time out was conducted at 13:25, prior to the start of the procedure. A  Minimum amount of bleeding was controlled with Pressure. The procedure was tolerated well with a pain level of 0 throughout and a pain level of 0 following the procedure. Post Debridement Measurements: 1.9cm length x 0.5cm width x 0.2cm depth; 0.149cm^3 volume. Post debridement Stage noted as Category/Stage III. Character of Wound/Ulcer Post Debridement is improved. Post procedure Diagnosis Wound #1: Same as Pre-Procedure Plan Follow-up Appointments: Return Appointment in 1 week. Dressing Change Frequency: Change Dressing every other day. - home health to change weekly and all other days husband to change Skin Barriers/Peri-Wound Care: Skin Prep Wound Cleansing: May shower and wash wound with soap and water. - with dressing changes Primary Wound Dressing: Wound #1 Sacrum: Silver Collagen - moisten with hydrogel Secondary Dressing: Wound #1 Sacrum: Foam Border Other: - foam border to right lateral ankle for protection. Off-Loading: Wound #1 Sacrum: Low air-loss mattress (Group  2) - pending insurance approval for group 2 air mattress from [employer]. Turn and reposition every 2 hours Home Health: [place] skilled nursing for wound care. - home health ([facility]) to change dressing weekly and husband to change on other days 1. I am continuing with silver collagen moistened with hydrogel. 2. We talked a lot about offloading this area. Both the patient and her husband seem to understand this 3. The superficial stage II pressure ulcer on the right lateral most malleolus from last week appears to have healed over. Electronic Signature(s) Signed: 12/06/2018 5:47:05 PM By: Linton Ham MD Entered By: Linton Ham on 12/06/2018 13:48:48 -------------------------------------------------------------------------------- SuperBill Details Patient Name: Date of Service: Abigail Taylor 12/06/2018 Medical Record Number:[id number] Patient Account Number:  [id number] Date of Birth/Sex: Treating RN: 26-May-1931 (85 y.o. [name] Primary Care Provider: [name] Other Clinician: Referring Provider: Treating Provider/Extender:Kross Swallows, Gillermina Phy, Sara Chu in Treatment: 1 Diagnosis Coding ICD-10 Codes Code Description [phone] Pressure ulcer of sacral region, stage 3 L89.512 Pressure ulcer of right ankle, stage 2 Facility Procedures The patient participates with Medicare or their insurance follows the Medicare Facility Guidelines: CPT4 Code Description Modifier Quantity [id number] [place] TISSUE 20 SQ CM/< 1 ICD-10 Diagnosis Description L89.153 Pressure ulcer of sacral  region, stage 3 Physician Procedures Electronic Signature(s) Signed: 12/06/2018 5:47:05 PM By: Linton Ham MD Entered By: Linton Ham on 12/06/2018 13:48:59

## 2018-12-08 DIAGNOSIS — G903 Multi-system degeneration of the autonomic nervous system: Secondary | ICD-10-CM | POA: Diagnosis not present

## 2018-12-08 DIAGNOSIS — E876 Hypokalemia: Secondary | ICD-10-CM | POA: Diagnosis not present

## 2018-12-08 DIAGNOSIS — G8929 Other chronic pain: Secondary | ICD-10-CM | POA: Diagnosis not present

## 2018-12-08 DIAGNOSIS — E785 Hyperlipidemia, unspecified: Secondary | ICD-10-CM | POA: Diagnosis not present

## 2018-12-08 DIAGNOSIS — Z7901 Long term (current) use of anticoagulants: Secondary | ICD-10-CM | POA: Diagnosis not present

## 2018-12-08 DIAGNOSIS — Z9181 History of falling: Secondary | ICD-10-CM | POA: Diagnosis not present

## 2018-12-08 DIAGNOSIS — I080 Rheumatic disorders of both mitral and aortic valves: Secondary | ICD-10-CM | POA: Diagnosis not present

## 2018-12-08 DIAGNOSIS — G20 Parkinson's disease: Secondary | ICD-10-CM | POA: Diagnosis not present

## 2018-12-08 DIAGNOSIS — E039 Hypothyroidism, unspecified: Secondary | ICD-10-CM | POA: Diagnosis not present

## 2018-12-08 DIAGNOSIS — I495 Sick sinus syndrome: Secondary | ICD-10-CM | POA: Diagnosis not present

## 2018-12-08 DIAGNOSIS — M1990 Unspecified osteoarthritis, unspecified site: Secondary | ICD-10-CM | POA: Diagnosis not present

## 2018-12-08 DIAGNOSIS — Z7952 Long term (current) use of systemic steroids: Secondary | ICD-10-CM | POA: Diagnosis not present

## 2018-12-08 DIAGNOSIS — I4891 Unspecified atrial fibrillation: Secondary | ICD-10-CM | POA: Diagnosis not present

## 2018-12-08 DIAGNOSIS — L89150 Pressure ulcer of sacral region, unstageable: Secondary | ICD-10-CM | POA: Diagnosis not present

## 2018-12-08 DIAGNOSIS — I209 Angina pectoris, unspecified: Secondary | ICD-10-CM | POA: Diagnosis not present

## 2018-12-08 DIAGNOSIS — I4510 Unspecified right bundle-branch block: Secondary | ICD-10-CM | POA: Diagnosis not present

## 2018-12-08 DIAGNOSIS — M549 Dorsalgia, unspecified: Secondary | ICD-10-CM | POA: Diagnosis not present

## 2018-12-08 DIAGNOSIS — K5900 Constipation, unspecified: Secondary | ICD-10-CM | POA: Diagnosis not present

## 2018-12-08 DIAGNOSIS — Z950 Presence of cardiac pacemaker: Secondary | ICD-10-CM | POA: Diagnosis not present

## 2018-12-12 DIAGNOSIS — I209 Angina pectoris, unspecified: Secondary | ICD-10-CM | POA: Diagnosis not present

## 2018-12-12 DIAGNOSIS — G20 Parkinson's disease: Secondary | ICD-10-CM | POA: Diagnosis not present

## 2018-12-12 DIAGNOSIS — G903 Multi-system degeneration of the autonomic nervous system: Secondary | ICD-10-CM | POA: Diagnosis not present

## 2018-12-12 DIAGNOSIS — I4891 Unspecified atrial fibrillation: Secondary | ICD-10-CM | POA: Diagnosis not present

## 2018-12-12 DIAGNOSIS — L89150 Pressure ulcer of sacral region, unstageable: Secondary | ICD-10-CM | POA: Diagnosis not present

## 2018-12-12 DIAGNOSIS — I4510 Unspecified right bundle-branch block: Secondary | ICD-10-CM | POA: Diagnosis not present

## 2018-12-13 ENCOUNTER — Other Ambulatory Visit: Payer: Self-pay

## 2018-12-13 ENCOUNTER — Encounter (HOSPITAL_BASED_OUTPATIENT_CLINIC_OR_DEPARTMENT_OTHER): Payer: Medicare Other | Admitting: Internal Medicine

## 2018-12-13 DIAGNOSIS — L89512 Pressure ulcer of right ankle, stage 2: Secondary | ICD-10-CM | POA: Diagnosis not present

## 2018-12-13 DIAGNOSIS — G20 Parkinson's disease: Secondary | ICD-10-CM | POA: Diagnosis not present

## 2018-12-13 DIAGNOSIS — L89153 Pressure ulcer of sacral region, stage 3: Secondary | ICD-10-CM | POA: Diagnosis not present

## 2018-12-13 DIAGNOSIS — Z950 Presence of cardiac pacemaker: Secondary | ICD-10-CM | POA: Diagnosis not present

## 2018-12-13 DIAGNOSIS — I739 Peripheral vascular disease, unspecified: Secondary | ICD-10-CM | POA: Diagnosis not present

## 2018-12-13 DIAGNOSIS — I495 Sick sinus syndrome: Secondary | ICD-10-CM | POA: Diagnosis not present

## 2018-12-13 NOTE — Progress Notes (Signed)
Abigail Taylor, Abigail Taylor ([id number]) Visit Report for 12/13/2018 Debridement Details Patient Name: Date of Service: Abigail Taylor, Abigail Taylor 12/13/2018 12:45 PM Medical Record Number:[id number] Patient Account Number: [id number] Date of Birth/Sex: [birth year] (85 y.o. F) Treating RN: Abigail Taylor Primary Care Provider: Redmond Taylor Other Clinician: Referring Provider: Treating Provider/Extender:, Abigail Taylor, Abigail Taylor in Treatment: 2 Debridement Performed for Wound #1 Sacrum Assessment: Performed By: Physician Abigail Taylor., MD Debridement Type: Debridement Level of Consciousness (Pre- Awake and Alert procedure): Pre-procedure Yes - 14:00 Verification/Time Out Taken: Start Time: 14:01 Pain Control: Lidocaine 4% Topical Solution Total Area Debrided (L x W): 1.2 (cm) x 0.7 (cm) = 0.84 (cm) Tissue and other material Viable, Non-Viable, Slough, Subcutaneous, Skin: Dermis , Fibrin/Exudate, Slough debrided: Level: Skin/Subcutaneous Tissue Debridement Description: Excisional Instrument: Curette Bleeding: Minimum Hemostasis Achieved: Pressure End Time: 14:04 Procedural Pain: 1 Post Procedural Pain: 3 Response to Treatment: Procedure was tolerated well Level of Consciousness Awake and Alert (Post-procedure): Post Debridement Measurements of Total Wound Length: (cm) 1.2 Stage: Category/Stage III Width: (cm) 0.7 Depth: (cm) 0.2 Volume: (cm) 0.132 Character of Wound/Ulcer Post Requires Further Debridement Debridement: Post Procedure Diagnosis Same as Pre-procedure Electronic Signature(s) Signed: 12/13/2018 6:25:25 PM By: Abigail Ham MD Signed: 12/13/2018 6:38:14 PM By: Abigail Taylor Entered By: Abigail Taylor on 12/13/2018 14:07:29 -------------------------------------------------------------------------------- HPI Details Patient Name: Date of Service: Abigail Taylor. 12/13/2018 12:45 PM Medical Record [id number] Patient Account Number:  [id number] Date of Birth/Sex: Treating RN: [birth year] (85 y.o. Abigail Taylor Primary Care Provider: Redmond Taylor Other Clinician: Referring Provider: Treating Provider/Extender:, Abigail Taylor, Abigail Taylor in Treatment: 2 History of Present Illness HPI Description: ADMISSION 11/29/2018 This is an 85 year old woman with Parkinson's disease who arrives in clinic accompanied by her husband. They are here for review of wound on the sacrum. There is very little about this in [place] link although it is referenced in a [facility] communication with I think home care. She is apparently in the hospital in September with cardiac related issues it was just after this that somebody noticed the ulcer. They have been using calcium alginate on the wound and they have [facility] home care. They do not have an offloading surface. It is really unclear if the wound is making progression or not. Just yesterday they also noticed a blister on the right lateral malleolus. No doubt this is a pressure related area as well. Past medical history includes atrial fibrillation, tachybradycardia syndrome, syncope, Parkinson's disease and PAD. ABIs in our clinic were not done 11/12; patient's area on the right lateral malleolus seems to have epithelialized over. She has a small oval-shaped wound but it is in a difficult situation right over her coccyx. We have been using silver collagen. She has home care. She claims to be aggressively offloading this area 11/19; right lateral malleolus has remained epithelialized. The area on her coccyx is still small and oval-shaped with adherent debris. We have been using silver collagen Electronic Signature(s) Signed: 12/13/2018 6:25:25 PM By: Abigail Ham MD Entered By: Abigail Taylor on 12/13/2018 14:07:56 -------------------------------------------------------------------------------- Physical Exam Details Patient Name: Date of Service: Abigail Taylor, Abigail Taylor  12/13/2018 12:45 PM Medical Record Number:[id number] Patient Account Number: [id number] Date of Birth/Sex: Treating RN: 06-Aug-1931 (85 y.o. Abigail Taylor Primary Care Provider: Redmond Taylor Other Clinician: Referring Provider: Treating Provider/Extender:, Abigail Taylor, Abigail Taylor in Treatment: 2 Constitutional Patient is hypertensive.. Pulse regular and within target range for patient.Marland Kitchen Respirations regular, non-labored and within target range.. Temperature is normal and within the target range for the  patient.Marland Kitchen Appears in no distress. Notes Wound exam; the area in question is on the lower sacrum/coccyx. Still adherent yellow fibrinous debris. Required debridement with a #3 curette hemostasis with direct pressure. There is no surrounding erythema Electronic Signature(s) Signed: 12/13/2018 6:25:25 PM By: Abigail Ham MD Entered By: Abigail Taylor on 12/13/2018 14:08:38 -------------------------------------------------------------------------------- Physician Orders Details Patient Name: Date of Service: Abigail Taylor, Abigail Taylor 12/13/2018 12:45 PM Medical Record Number:[id number] Patient Account Number: [id number] Date of Birth/Sex: Treating RN: March 17, 1931 (85 y.o. Abigail Taylor Primary Care Provider: Redmond Taylor Other Clinician: Referring Provider: Treating Provider/Extender:, Abigail Taylor, Abigail Taylor in Treatment: 2 Verbal / Phone Orders: No Diagnosis Coding ICD-10 Coding Code Description L89.153 Pressure ulcer of sacral region, stage 3 Follow-up Appointments Return Appointment in 2 weeks. Dressing Change Frequency Change Dressing every other day. - home health to change weekly and all other days husband to change Skin Barriers/Peri-Wound Care Skin Prep Wound Cleansing May shower and wash wound with soap and water. - with dressing changes Primary Wound Dressing Wound #1 Sacrum Silver Collagen - moisten with hydrogel Secondary Dressing Wound  #1 Sacrum Foam Border Off-Loading Wound #1 Sacrum Gel mattress overlay (Group 1) - Medical Modalities to sent out group 1 gel overlay. Turn and reposition every 2 hours [facility] skilled [facility] for wound care. - home health ([facility]) to change dressing weekly and husband to change on other days Electronic Signature(s) Signed: 12/13/2018 6:25:25 PM By: Abigail Ham MD Signed: 12/13/2018 6:38:14 PM By: Abigail Taylor Entered By: Abigail Taylor on 12/13/2018 14:05:52 -------------------------------------------------------------------------------- Problem List Details Patient Name: Date of Service: Abigail Taylor, Abigail Taylor 12/13/2018 12:45 PM Medical Record [id number] Patient Account Number: [id number] Date of Birth/Sex: Treating RN: November 06, 1931 (85 y.o. Helene Shoe, Tammi Klippel Primary Care Provider: Redmond Taylor Other Clinician: Referring Provider: Treating Provider/Extender:, Abigail Taylor, Abigail Taylor in Treatment: 2 Active Problems ICD-10 Evaluated Encounter Code Description Active Date Today Diagnosis L89.153 Pressure ulcer of sacral region, stage 3 11/29/2018 No Yes Inactive Problems ICD-10 Code Description Active Date Inactive Date L89.512 Pressure ulcer of right ankle, stage 2 11/29/2018 11/29/2018 Resolved Problems Electronic Signature(s) Signed: 12/13/2018 6:25:25 PM By: Abigail Ham MD Entered By: Abigail Taylor on 12/13/2018 14:06:53 -------------------------------------------------------------------------------- Progress Note Details Patient Name: Date of Service: Abigail Taylor 12/13/2018 12:45 PM Medical Record [id number] Patient Account Number: [id number] Date of Birth/Sex: Treating RN: December 18, 1931 (85 y.o. Abigail Taylor Primary Care Provider: Redmond Taylor Other Clinician: Referring Provider: Treating Provider/Extender:, Abigail Taylor, Abigail Taylor in Treatment: 2 Subjective History of Present Illness  (HPI) ADMISSION 11/29/2018 This is an 85 year old woman with Parkinson's disease who arrives in clinic accompanied by her husband. They are here for review of wound on the sacrum. There is very little about this in [place] link although it is referenced in a [facility] communication with I think home care. She is apparently in the hospital in September with cardiac related issues it was just after this that somebody noticed the ulcer. They have been using calcium alginate on the wound and they have advanced home care. They do not have an offloading surface. It is really unclear if the wound is making progression or not. Just yesterday they also noticed a blister on the right lateral malleolus. No doubt this is a pressure related area as well. Past medical history includes atrial fibrillation, tachybradycardia syndrome, syncope, Parkinson's disease and PAD. ABIs in our clinic were not done 11/12; patient's area on the right lateral malleolus seems to have epithelialized over. She has a  small oval-shaped wound but it is in a difficult situation right over her coccyx. We have been using silver collagen. She has home care. She claims to be aggressively offloading this area 11/19; right lateral malleolus has remained epithelialized. The area on her coccyx is still small and oval-shaped with adherent debris. We have been using silver collagen Objective Constitutional Patient is hypertensive.. Pulse regular and within target range for patient.Marland Kitchen Respirations regular, non-labored and within target range.. Temperature is normal and within the target range for the patient.Marland Kitchen Appears in no distress. Vitals Time Taken: 1:15 PM, Height: 66 in, Weight: 120 lbs, BMI: 19.4, Temperature: 97.8 F, Pulse: 67 bpm, Respiratory Rate: 18 breaths/min, Blood Pressure: 165/69 mmHg. General Notes: Wound exam; the area in question is on the lower sacrum/coccyx. Still adherent yellow fibrinous debris. Required debridement  with a #3 curette hemostasis with direct pressure. There is no surrounding erythema Integumentary (Hair, Skin) Wound #1 status is Open. Original cause of wound was Pressure Injury. The wound is located on the Sacrum. The wound measures 1.2cm length x 0.7cm width x 0.2cm depth; 0.66cm^2 area and 0.132cm^3 volume. There is Fat Layer (Subcutaneous Tissue) Exposed exposed. There is no tunneling or undermining noted. There is a medium amount of serosanguineous drainage noted. The wound margin is flat and intact. There is small (1-33%) pink, pale granulation within the wound bed. There is a large (67-100%) amount of necrotic tissue within the wound bed including Adherent Slough. Assessment Active Problems ICD-10 Pressure ulcer of sacral region, stage 3 Procedures Wound #1 Pre-procedure diagnosis of Wound #1 is a Pressure Ulcer located on the Sacrum . There was a Excisional Skin/Subcutaneous Tissue Debridement with a total area of 0.84 sq cm performed by Abigail Taylor., MD. With the following instrument(s): Curette to remove Viable and Non-Viable tissue/material. Material removed includes Subcutaneous Tissue, Slough, Skin: Dermis, and Fibrin/Exudate after achieving pain control using Lidocaine 4% Topical Solution. A time out was conducted at 14:00, prior to the start of the procedure. A Minimum amount of bleeding was controlled with Pressure. The procedure was tolerated well with a pain level of 1 throughout and a pain level of 3 following the procedure. Post Debridement Measurements: 1.2cm length x 0.7cm width x 0.2cm depth; 0.132cm^3 volume. Post debridement Stage noted as Category/Stage III. Character of Wound/Ulcer Post Debridement requires further debridement. Post procedure Diagnosis Wound #1: Same as Pre-Procedure Plan Follow-up Appointments: Return Appointment in 2 weeks. Dressing Change Frequency: Change Dressing every other day. - home health to change weekly and all other days  husband to change Skin Barriers/Peri-Wound Care: Skin Prep Wound Cleansing: May shower and wash wound with soap and water. - with dressing changes Primary Wound Dressing: Wound #1 Sacrum: Silver Collagen - moisten with hydrogel Secondary Dressing: Wound #1 Sacrum: Foam Border Off-Loading: Wound #1 Sacrum: Gel mattress overlay (Group 1) - Medical Modalities to sent out group 1 gel overlay. Turn and reposition every 2 hours Home Health: [place] skilled nursing for wound care. - home health ([facility]) to change dressing weekly and husband to change on other days 1. Continue silver collagen border foam. She has home health 2. Follow-up in 2 weeks 3. Emphasized aggressive offloading Electronic Signature(s) Signed: 12/13/2018 6:25:25 PM By: Abigail Ham MD Entered By: Abigail Taylor on 12/13/2018 14:09:07 -------------------------------------------------------------------------------- SuperBill Details Patient Name: Date of Service: Abigail Taylor 12/13/2018 Medical Record Number:[id number] Patient Account Number: [id number] Date of Birth/Sex: Treating RN: 09-Jul-1931 (85 y.o. Abigail Taylor Primary Care Provider: Redmond Taylor Other  Clinician: Referring Provider: Treating Provider/Extender:, Abigail Taylor, Abigail Taylor in Treatment: 2 Diagnosis Coding ICD-10 Codes Code Description L89.153 Pressure ulcer of sacral region, stage 3 Facility Procedures The patient participates with Medicare or their insurance follows the Medicare Facility Guidelines: CPT4 Code Description Modifier Quantity [id number] [place] TISSUE 20 SQ CM/< 1 ICD-10 Diagnosis Description L89.153 Pressure ulcer of sacral  region, stage 3 Physician Procedures CPT4 Code: [id number] Description: [id number] - WC PHYS SUBQ TISS 20 SQ CM ICD-10 Diagnosis Description L89.153 Pressure ulcer of sacral region, stage 3 Modifier: Quantity: 1 Electronic Signature(s) Signed: 12/13/2018 6:25:25  PM By: Abigail Ham MD Entered By: Abigail Taylor on 12/13/2018 14:09:17

## 2018-12-13 NOTE — Progress Notes (Addendum)
Abigail, Taylor ([id number]) Visit Report for 12/13/2018 Arrival Information Details Patient Name: Date of Service: Abigail Taylor, Abigail Taylor 12/13/2018 12:45 PM Medical Record Number:[id number] Patient Account Number: [id number] Date of Birth/Sex: Treating RN: 12-21-1931 (85 y.o. Clearnce Sorrel Primary Care Fredia Chittenden: [name] Other Clinician: Referring Badr Piedra: Treating Yuki Purves/Extender:Robson, Gillermina Phy, Sara Chu in Treatment: 2 Visit Information History Since Last Visit Added or deleted any medications: No Patient Arrived: Ambulatory Any new allergies or adverse reactions: No Arrival Time: 13:14 Had a fall or experienced change in No Accompanied By: husband activities of daily living that may affect Transfer Assistance: None risk of falls: Patient Identification Verified: Yes Signs or symptoms of abuse/neglect since last No Secondary Verification Process Yes visito Completed: Hospitalized since last visit: No Patient Requires Transmission- No Implantable device outside of the clinic excluding No Based Precautions: cellular tissue based products placed in the center Patient Has Alerts: Yes since last visit: Patient Alerts: Patient on Blood Has Dressing in Place as Prescribed: Yes Thinner Pain Present Now: No Electronic Signature(s) Signed: 12/13/2018 6:05:31 PM By: Kela Millin Entered By: Kela Millin on 12/13/2018 13:15:23 -------------------------------------------------------------------------------- Lower Extremity Assessment Details Patient Name: Date of Service: Abigail, Taylor 12/13/2018 12:45 PM Medical Record Number:[id number] Patient Account Number: [id number] Date of Birth/Sex: Treating RN: May 05, 1931 (85 y.o. Clearnce Sorrel Primary Care Ruby Logiudice: [name] Other Clinician: Referring Twana Wileman: Treating Suha Schoenbeck/Extender:Robson, Gillermina Phy, Sara Chu in Treatment: 2 Electronic Signature(s) Signed:  12/13/2018 6:05:31 PM By: Kela Millin Entered By: Kela Millin on 12/13/2018 13:16:10 -------------------------------------------------------------------------------- Multi Wound Chart Details Patient Name: Date of Service: Abigail, Taylor 12/13/2018 12:45 PM Medical Record Number:[id number] Patient Account Number: [id number] Date of Birth/Sex: Treating RN: 1931-02-04 (85 y.o. [name] Primary Care Grecia Lynk: [name] Other Clinician: Referring Wilder Kurowski: Treating Aeric Burnham/Extender:Robson, Gillermina Phy, Sara Chu in Treatment: 2 Vital Signs Height(in): 66 Pulse(bpm): 67 Weight(lbs): 120 Blood Pressure(mmHg): 165/69 Body Mass Index(BMI): 19 Temperature(F): 97.8 Respiratory 18 Rate(breaths/min): Photos: [1:No Photos] [N/A:N/A] Wound Location: [1:Sacrum] [N/A:N/A] Wounding Event: [1:Pressure Injury] [N/A:N/A] Primary Etiology: [1:Pressure Ulcer] [N/A:N/A] Comorbid History: [1:Middle ear problems, Arrhythmia, Hypotension, Osteoarthritis] [N/A:N/A] Date Acquired: [1:09/25/2018] [N/A:N/A] Weeks of Treatment: [1:2] [N/A:N/A] Wound Status: [1:Open] [N/A:N/A] Measurements L x W x D 1.2x0.7x0.2 [N/A:N/A] (cm) Area (cm) : [1:0.66] [N/A:N/A] Volume (cm) : [1:0.132] [N/A:N/A] % Reduction in Area: [1:22.20%] [N/A:N/A] % Reduction in Volume: -55.30% [N/A:N/A] Classification: [1:Category/Stage III] [N/A:N/A] Exudate Amount: [1:Medium] [N/A:N/A] Exudate Type: [1:Serosanguineous] [N/A:N/A] Exudate Color: [1:red, brown] [N/A:N/A] Wound Margin: [1:Flat and Intact] [N/A:N/A] Granulation Amount: [1:Small (1-33%)] [N/A:N/A] Granulation Quality: [1:Pink, Pale] [N/A:N/A] Necrotic Amount: [1:Large (67-100%)] [N/A:N/A] Exposed Structures: [1:Fat Layer (Subcutaneous Tissue) Exposed: Yes Fascia: No Tendon: No Muscle: No Joint: No Bone: No] [N/A:N/A] Epithelialization: [1:Small (1-33%)] [N/A:N/A] Debridement: [1:Debridement - Excisional] [N/A:N/A] Pre-procedure  [1:14:00] [N/A:N/A] Verification/Time Out Taken: Pain Control: [1:Lidocaine 4% Topical Solution] Tissue Debrided: [1:Subcutaneous, Slough] Level: [1:Skin/Subcutaneous Tissue] Debridement Area (sq cm):0.84 Instrument: [1:Curette] Bleeding: [1:Minimum] Hemostasis Achieved: [1:Pressure] Procedural Pain: [1:1] Post Procedural Pain: [1:3] Debridement Treatment Procedure was tolerated Response: [1:well] Post Debridement [1:1.2x0.7x0.2] Measurements L x W x D (cm) Post Debridement [1:0.132] Volume: (cm) Post Debridement Stage: Category/Stage III Treatment Notes Electronic Signature(s) Signed: 12/13/2018 6:25:25 PM By: Linton Ham MD Signed: 12/13/2018 6:38:14 PM By: Deon Pilling Entered By: Linton Ham on 12/13/2018 14:07:17 -------------------------------------------------------------------------------- Multi-Disciplinary Care Plan Details Patient Name: Date of Service: Abigail Taylor. 12/13/2018 12:45 PM Medical Record [id number] Patient Account Number: [id number] Date of Birth/Sex: Treating RN: [birth year] (85 y.o. [name] Primary Care Tamberly Pomplun: [name]  Other Clinician: Referring Delphin Funes: Treating Emani Morad/Extender:Robson, Gillermina Phy, Sara Chu in Treatment: 2 Active Inactive Nutrition Nursing Diagnoses: Potential for alteratiion in Nutrition/Potential for imbalanced nutrition Goals: Patient/caregiver agrees to and verbalizes understanding of need to use nutritional supplements and/or vitamins as prescribed Date Initiated: 11/29/2018 Target Resolution Date: 12/28/2018 Goal Status: Active Interventions: Provide education on nutrition Treatment Activities: Education provided on Nutrition : 12/06/2018 Notes: Pressure Nursing Diagnoses: Potential for impaired tissue integrity related to pressure, friction, moisture, and shear Goals: Patient/caregiver will verbalize understanding of pressure ulcer management Date Initiated:  11/29/2018 Target Resolution Date: 12/28/2018 Goal Status: Active Interventions: Provide education on pressure ulcers Notes: Electronic Signature(s) Signed: 12/13/2018 6:38:14 PM By: Deon Pilling Entered By: Deon Pilling on 12/13/2018 13:12:24 -------------------------------------------------------------------------------- Pain Assessment Details Patient Name: Date of Service: CHANDINI, RHEINGANS 12/13/2018 12:45 PM Medical Record Number:[id number] Patient Account Number: [id number] Date of Birth/Sex: Treating RN: 1932/01/18 (85 y.o. Clearnce Sorrel Primary Care Sharlie Shreffler: [name] Other Clinician: Referring Alaila Pillard: Treating Jashley Yellin/Extender:Robson, Gillermina Phy, Sara Chu in Treatment: 2 Active Problems Location of Pain Severity and Description of Pain Patient Has Paino No Site Locations Pain Management and Medication Current Pain Management: Electronic Signature(s) Signed: 12/13/2018 6:05:31 PM By: Kela Millin Entered By: Kela Millin on 12/13/2018 13:16:04 -------------------------------------------------------------------------------- Patient/Caregiver Education Details Patient Name: Date of Service: Abigail Taylor 11/19/2020andnbsp12:45 PM Medical Record Patient Account Number: [id number] Number: Treating RN: Deon Pilling Date of Birth/Gender: 1931/07/15 (85 y.o. F) Other Clinician: Primary Care Physician: Queen Blossom Referring Physician: Physician/Extender: Rolm Bookbinder in Treatment: 2 Education Assessment Education Provided To: Patient Education Topics Provided Nutrition: Handouts: Nutrition Methods: Explain/Verbal Responses: Reinforcements needed Electronic Signature(s) Signed: 12/13/2018 6:38:14 PM By: Deon Pilling Entered By: Deon Pilling on 12/13/2018 13:12:33 -------------------------------------------------------------------------------- Wound Assessment  Details Patient Name: Date of Service: VEGAS, RITTNER 12/13/2018 12:45 PM Medical Record Number:[id number] Patient Account Number: [id number] Date of Birth/Sex: Treating RN: Oct 08, 1931 (85 y.o. Clearnce Sorrel Primary Care Mardi Cannady: [name] Other Clinician: Referring Eveny Anastas: Treating [place] Kappes/Extender:Robson, Gillermina Phy, Sara Chu in Treatment: 2 Wound Status Wound Number: 1 Primary Pressure Ulcer Etiology: Wound Location: Sacrum Wound Open Wounding Event: Pressure Injury Status: Date Acquired: 09/25/2018 Comorbid Middle ear problems, Arrhythmia, Weeks Of Treatment: 2 History: Hypotension, Osteoarthritis Clustered Wound: No Photos Wound Measurements Length: (cm) 1.2 Width: (cm) 0.7 Depth: (cm) 0.2 Area: (cm) 0.66 Volume: (cm) 0.132 Wound Description Classification: Category/Stage III Wound Margin: Flat and Intact Exudate Amount: Medium Exudate Type: Serosanguineous Exudate Color: red, brown Wound Bed Granulation Amount: Small (1-33%) Granulation Quality: Pink, Pale Necrotic Amount: Large (67-100%) Necrotic Quality: Adherent Slough After Cleansing: No brino Yes Exposed Structure osed: No (Subcutaneous Tissue) Exposed: Yes osed: No osed: No sed: No ed: No % Reduction in Area: 22.2% % Reduction in Volume: -55.3% Epithelialization: Small (1-33%) Tunneling: No Undermining: No Foul Odor Slough/Fi Fascia Exp Fat Layer Tendon Exp Muscle Exp Joint Expo Bone Expos Electronic Signature(s) Signed: 12/17/2018 2:17:56 PM By: Kela Millin Signed: 12/18/2018 7:37:04 AM By: Mikeal Hawthorne EMT/HBOT Previous Signature: 12/13/2018 6:05:31 PM Version By: Kela Millin Entered By: Mikeal Hawthorne on 12/17/2018 10:30:12 -------------------------------------------------------------------------------- Vitals Details Patient Name: Date of Service: Abigail Taylor. 12/13/2018 12:45 PM Medical Record [id number] Patient Account  Number: [id number] Date of Birth/Sex: Treating RN: [birth year] (85 y.o. Clearnce Sorrel Primary Care Lamichael Youkhana: [name] Other Clinician: Referring Nunzio Banet: Treating Makenzi Bannister/Extender:Robson, Gillermina Phy, Sara Chu in Treatment: 2 Vital Signs Time Taken: 13:15 Temperature (F): 97.8 Height (in): 66 Pulse (bpm): 67 Weight (lbs): 120 Respiratory  Rate (breaths/min): 18 Body Mass Index (BMI): 19.4 Blood Pressure (mmHg): 165/69 Reference Range: 80 - 120 mg / dl Electronic Signature(s) Signed: 12/13/2018 6:05:31 PM By: Kela Millin Entered By: Kela Millin on 12/13/2018 13:15:58

## 2018-12-17 DIAGNOSIS — H9113 Presbycusis, bilateral: Secondary | ICD-10-CM | POA: Diagnosis not present

## 2018-12-17 DIAGNOSIS — Z681 Body mass index (BMI) 19 or less, adult: Secondary | ICD-10-CM | POA: Diagnosis not present

## 2018-12-17 DIAGNOSIS — E063 Autoimmune thyroiditis: Secondary | ICD-10-CM | POA: Diagnosis not present

## 2018-12-17 DIAGNOSIS — I4891 Unspecified atrial fibrillation: Secondary | ICD-10-CM | POA: Diagnosis not present

## 2018-12-17 DIAGNOSIS — G20 Parkinson's disease: Secondary | ICD-10-CM | POA: Diagnosis not present

## 2018-12-18 DIAGNOSIS — H04123 Dry eye syndrome of bilateral lacrimal glands: Secondary | ICD-10-CM | POA: Diagnosis not present

## 2018-12-18 DIAGNOSIS — L89150 Pressure ulcer of sacral region, unstageable: Secondary | ICD-10-CM | POA: Diagnosis not present

## 2018-12-18 DIAGNOSIS — I209 Angina pectoris, unspecified: Secondary | ICD-10-CM | POA: Diagnosis not present

## 2018-12-18 DIAGNOSIS — G903 Multi-system degeneration of the autonomic nervous system: Secondary | ICD-10-CM | POA: Diagnosis not present

## 2018-12-18 DIAGNOSIS — I4891 Unspecified atrial fibrillation: Secondary | ICD-10-CM | POA: Diagnosis not present

## 2018-12-18 DIAGNOSIS — G20 Parkinson's disease: Secondary | ICD-10-CM | POA: Diagnosis not present

## 2018-12-18 DIAGNOSIS — I4510 Unspecified right bundle-branch block: Secondary | ICD-10-CM | POA: Diagnosis not present

## 2018-12-18 DIAGNOSIS — H524 Presbyopia: Secondary | ICD-10-CM | POA: Diagnosis not present

## 2018-12-24 DIAGNOSIS — E785 Hyperlipidemia, unspecified: Secondary | ICD-10-CM | POA: Diagnosis not present

## 2018-12-24 DIAGNOSIS — G903 Multi-system degeneration of the autonomic nervous system: Secondary | ICD-10-CM | POA: Diagnosis not present

## 2018-12-24 DIAGNOSIS — G20 Parkinson's disease: Secondary | ICD-10-CM | POA: Diagnosis not present

## 2018-12-24 DIAGNOSIS — I4819 Other persistent atrial fibrillation: Secondary | ICD-10-CM | POA: Diagnosis not present

## 2018-12-26 DIAGNOSIS — I4510 Unspecified right bundle-branch block: Secondary | ICD-10-CM | POA: Diagnosis not present

## 2018-12-26 DIAGNOSIS — G20 Parkinson's disease: Secondary | ICD-10-CM | POA: Diagnosis not present

## 2018-12-26 DIAGNOSIS — I209 Angina pectoris, unspecified: Secondary | ICD-10-CM | POA: Diagnosis not present

## 2018-12-26 DIAGNOSIS — I4891 Unspecified atrial fibrillation: Secondary | ICD-10-CM | POA: Diagnosis not present

## 2018-12-26 DIAGNOSIS — G903 Multi-system degeneration of the autonomic nervous system: Secondary | ICD-10-CM | POA: Diagnosis not present

## 2018-12-26 DIAGNOSIS — L89150 Pressure ulcer of sacral region, unstageable: Secondary | ICD-10-CM | POA: Diagnosis not present

## 2018-12-27 ENCOUNTER — Encounter (HOSPITAL_BASED_OUTPATIENT_CLINIC_OR_DEPARTMENT_OTHER): Payer: Medicare Other | Attending: Internal Medicine | Admitting: Internal Medicine

## 2018-12-27 ENCOUNTER — Other Ambulatory Visit: Payer: Self-pay

## 2018-12-27 DIAGNOSIS — I10 Essential (primary) hypertension: Secondary | ICD-10-CM | POA: Insufficient documentation

## 2018-12-27 DIAGNOSIS — I4891 Unspecified atrial fibrillation: Secondary | ICD-10-CM | POA: Insufficient documentation

## 2018-12-27 DIAGNOSIS — G20 Parkinson's disease: Secondary | ICD-10-CM | POA: Diagnosis not present

## 2018-12-27 DIAGNOSIS — L89153 Pressure ulcer of sacral region, stage 3: Secondary | ICD-10-CM | POA: Diagnosis not present

## 2018-12-27 DIAGNOSIS — L89159 Pressure ulcer of sacral region, unspecified stage: Secondary | ICD-10-CM | POA: Diagnosis not present

## 2018-12-28 NOTE — Progress Notes (Signed)
Abigail Taylor, Abigail Taylor ([id number]) Visit Report for 12/27/2018 HPI Details Patient Name: Date of Service: Abigail Taylor, Abigail Taylor 12/27/2018 12:45 PM Medical Record Number:[id number] Patient Account Number: [id number] Date of Birth/Sex: Treating RN: 19-Dec-1931 (85 y.o. F) Primary Care Provider: [name] Other Clinician: Referring Provider: Treating Provider/Extender:Robson, Gillermina Phy, Sara Chu in Treatment: 4 History of Present Illness HPI Description: ADMISSION 11/29/2018 This is an 85 year old woman with Parkinson's disease who arrives in clinic accompanied by her husband. They are here for review of wound on the sacrum. There is very little about this in  link although it is referenced in a [facility] communication with [facility]. She is apparently in the hospital in September with cardiac related issues it was just after this that somebody noticed the ulcer. They have been using calcium alginate on the wound and they have [facility] home [facility]. They do not have an offloading surface. It is really unclear if the wound is making progression or not. Just yesterday they also noticed a blister on the right lateral malleolus. No doubt this is a pressure related area as well. Past medical history includes atrial fibrillation, tachybradycardia syndrome, syncope, Parkinson's disease and PAD. ABIs in our clinic were not done 11/12; patient's area on the right lateral malleolus seems to have epithelialized over. She has a small oval-shaped wound but it is in a difficult situation right over her coccyx. We have been using silver collagen. She has home care. She claims to be aggressively offloading this area 11/19; right lateral malleolus has remained epithelialized. The area on her coccyx is still small and oval-shaped with adherent debris. We have been using silver collagen 12/3; right lateral malleolus remains closed. The area over her coccyx is smaller but still with 2 to 3 mm  of depth. We have been using silver collagen with improvement with both the patient and her husband say they are meticulously offloading this area Electronic Signature(s) Signed: 12/28/2018 7:57:15 AM By: Linton Ham MD Entered By: Linton Ham on 12/27/2018 14:23:19 -------------------------------------------------------------------------------- Physical Exam Details Patient Name: Date of Service: Abigail Taylor 12/27/2018 12:45 PM Medical Record [id number] Patient Account Number: [id number] Date of Birth/Sex: Treating RN: 07-Oct-1931 (85 y.o. F) Primary Care Provider: [name] Other Clinician: Referring Provider: Treating Provider/Extender:Robson, Gillermina Phy, Sara Chu in Treatment: 4 Constitutional Patient is hypertensive.. Pulse regular and within target range for patient.Marland Kitchen Respirations regular, non-labored and within target range.. Temperature is normal and within the target range for the patient.Marland Kitchen Appears in no distress. Respiratory work of breathing is normal. Integumentary (Hair, Skin) No erythema around the wound. Psychiatric appears at normal baseline. Patient is alert and responsive. Notes Wound exam; the area in question is on her lower sacrum/coccyx. Under illumination there is really nothing that required debridement. A smaller oval wound with a reasonable looking wound bed. There is no surrounding erythema no soft tissue tenderness. The skin around this wound looks satisfactory. Electronic Signature(s) Signed: 12/28/2018 7:57:15 AM By: Linton Ham MD Entered By: Linton Ham on 12/27/2018 14:24:41 -------------------------------------------------------------------------------- Physician Orders Details Patient Name: Date of Service: Abigail Taylor, Abigail Taylor 12/27/2018 12:45 PM Medical Record Number:[id number] Patient Account Number: [id number] Date of Birth/Sex: Treating RN: 1931-09-07 (85 y.o. [name] Primary Care Provider:  [name] Other Clinician: Referring Provider: Treating Provider/Extender:Robson, Gillermina Phy, Sara Chu in Treatment: 4 Verbal / Phone Orders: No Diagnosis Coding ICD-10 Coding Code Description L89.153 Pressure ulcer of sacral region, stage 3 Follow-up Appointments Return Appointment in 2 weeks. Dressing Change Frequency Change Dressing  every other day. - home health to change weekly and all other days husband to change Skin Barriers/Peri-Wound Care Skin Prep Wound Cleansing May shower and wash wound with soap and water. - with dressing changes Primary Wound Dressing Wound #1 Sacrum Silver Collagen - moisten with hydrogel Secondary Dressing Wound #1 Sacrum Foam Border Off-Loading Wound #1 Sacrum Turn and reposition every 2 hours [facility] for wound care. - home health ([facility] to change dressing weekly and husband to change on other days Electronic Signature(s) Signed: 12/27/2018 6:37:14 PM By: Deon Pilling Signed: 12/28/2018 7:57:15 AM By: Linton Ham MD Entered By: Deon Pilling on 12/27/2018 14:07:29 -------------------------------------------------------------------------------- Problem List Details Patient Name: Date of Service: Abigail Taylor, Abigail Taylor 12/27/2018 12:45 PM Medical Record [id number] Patient Account Number: [id number] Date of Birth/Sex: Treating RN: [birth year] (85 y.o. Helene Shoe, Tammi Klippel), Tammi Klippel Primary Care Provider: [name] Other Clinician: Referring Provider: Treating Provider/Extender:Robson, Gillermina Phy, Sara Chu in Treatment: 4 Active Problems ICD-10 Evaluated Encounter Code Description Active Date Today Diagnosis L89.153 Pressure ulcer of sacral region, stage 3 11/29/2018 No Yes Inactive Problems ICD-10 Code Description Active Date Inactive Date L89.512 Pressure ulcer of right ankle, stage 2 11/29/2018 11/29/2018 Resolved Problems Electronic Signature(s) Signed: 12/28/2018  7:57:15 AM By: Linton Ham MD Entered By: Linton Ham on 12/27/2018 14:21:10 -------------------------------------------------------------------------------- Progress Note Details Patient Name: Date of Service: Abigail Taylor 12/27/2018 12:45 PM Medical Record [id number] Patient Account Number: [id number] Date of Birth/Sex: Treating RN: Oct 25, 1931 (85 y.o. F) Primary Care Provider: [name] Other Clinician: Referring Provider: Treating Provider/Extender:Robson, Gillermina Phy, Sara Chu in Treatment: 4 Subjective History of Present Illness (HPI) ADMISSION 11/29/2018 This is an 85 year old woman with Parkinson's disease who arrives in clinic accompanied by her husband. They are here for review of wound on the sacrum. There is very little about this in [place] link although it is referenced in a [facility] communication with [facility]. She is apparently in the hospital in September with cardiac related issues it was just after this that somebody noticed the ulcer. They have been using calcium alginate on the wound and they have advanced home care. They do not have an offloading surface. It is really unclear if the wound is making progression or not. Just yesterday they also noticed a blister on the right lateral malleolus. No doubt this is a pressure related area as well. Past medical history includes atrial fibrillation, tachybradycardia syndrome, syncope, Parkinson's disease and PAD. ABIs in our clinic were not done 11/12; patient's area on the right lateral malleolus seems to have epithelialized over. She has a small oval-shaped wound but it is in a difficult situation right over her coccyx. We have been using silver collagen. She has home care. She claims to be aggressively offloading this area 11/19; right lateral malleolus has remained epithelialized. The area on her coccyx is still small and oval-shaped with adherent debris. We have been using  silver collagen 12/3; right lateral malleolus remains closed. The area over her coccyx is smaller but still with 2 to 3 mm of depth. We have been using silver collagen with improvement with both the patient and her husband say they are meticulously offloading this area Objective Constitutional Patient is hypertensive.. Pulse regular and within target range for patient.Marland Kitchen Respirations regular, non-labored and within target range.. Temperature is normal and within the target range for the patient.Marland Kitchen Appears in no distress. Vitals Time Taken: 1:25 PM, Height: 66 in, Weight: 120 lbs, BMI: 19.4, Temperature: 97.8 F,  Pulse: 64 bpm, Respiratory Rate: 17 breaths/min, Blood Pressure: 158/98 mmHg. Respiratory work of breathing is normal. Psychiatric appears at normal baseline. Patient is alert and responsive. General Notes: Wound exam; the area in question is on her lower sacrum/coccyx. Under illumination there is really nothing that required debridement. A smaller oval wound with a reasonable looking wound bed. There is no surrounding erythema no soft tissue tenderness. The skin around this wound looks satisfactory. Integumentary (Hair, Skin) No erythema around the wound. Wound #1 status is Open. Original cause of wound was Pressure Injury. The wound is located on the Sacrum. The wound measures 0.5cm length x 0.4cm width x 0.3cm depth; 0.157cm^2 area and 0.047cm^3 volume. There is Fat Layer (Subcutaneous Tissue) Exposed exposed. There is no tunneling or undermining noted. There is a medium amount of serosanguineous drainage noted. The wound margin is flat and intact. There is large (67-100%) pink, pale granulation within the wound bed. There is a small (1-33%) amount of necrotic tissue within the wound bed including Adherent Slough. Assessment Active Problems ICD-10 Pressure ulcer of sacral region, stage 3 Plan Follow-up Appointments: Return Appointment in 2 weeks. Dressing Change  Frequency: Change Dressing every other day. - home health to change weekly and all other days husband to change Skin Barriers/Peri-Wound Care: Skin Prep Wound Cleansing: May shower and wash wound with soap and water. - with dressing changes Primary Wound Dressing: Wound #1 Sacrum: Silver Collagen - moisten with hydrogel Secondary Dressing: Wound #1 Sacrum: Foam Border Off-Loading: Wound #1 Sacrum: Turn and reposition every 2 hours Home Health: [facility] skilled nursing for wound care. - home health ([facility]) to change dressing weekly and husband to change on other days 1 continue with silver collagen and bordered foam 2. I think what is really making this work is meticulous offloading 3. Follow-up in 2 weeks 4. They refused the gel overlay mattress stating it did not fit properly on the mattress Electronic Signature(s) Signed: 12/28/2018 7:57:15 AM By: Linton Ham MD Entered By: Linton Ham on 12/27/2018 14:29:25 -------------------------------------------------------------------------------- SuperBill Details Patient Name: Date of Service: Abigail Taylor 12/27/2018 Medical Record Number:[id number] Patient Account Number: [id number] Date of Birth/Sex: Treating RN: [birth year] (85 y.o. [name] Primary Care Provider: [name] Other Clinician: Referring Provider: Treating Provider/Extender:Robson, Gillermina Phy, Sara Chu in Treatment: 4 Diagnosis Coding ICD-10 Codes Code Description [phone] Pressure ulcer of sacral region, stage 3 Facility Procedures The patient participates with Medicare or their insurance follows the Medicare Facility Guidelines: CPT4 Code Description Modifier Quantity [id number] [place] VISIT-LEV 3 EST PT 1 Physician Procedures CPT4 Code: [id number] Description: [id number] - WC PHYS LEVEL 3 - EST PT ICD-10 Diagnosis Description L89.153 Pressure ulcer of sacral region, stage 3 Modifier: Quantity: 1 Electronic  Signature(s) Signed: 12/28/2018 7:57:15 AM By: Linton Ham MD Entered By: Linton Ham on 12/27/2018 14:29:41

## 2019-01-02 NOTE — Progress Notes (Signed)
Abigail Taylor, Abigail Taylor ([id number]) Visit Report for 12/27/2018 Arrival Information Details Patient Name: Date of Service: Abigail Taylor, Abigail Taylor 12/27/2018 12:45 PM Medical Record Number:[id number] Patient Account Number: [id number] Date of Birth/Sex: Treating RN: Jan 21, 1932 (85 y.o. Clearnce Sorrel Primary Care Seville Brick: [name] Other Clinician: Referring Eileene Kisling: Treating Talmage Teaster/Extender:Robson, Gillermina Phy, Sara Chu in Treatment: 4 Visit Information History Since Last Visit Added or deleted any medications: No Patient Arrived: Ambulatory Any new allergies or adverse reactions: No Arrival Time: 13:33 Had a fall or experienced change in No Accompanied By: self activities of daily living that may affect Transfer Assistance: None risk of falls: Patient Identification Verified: Yes Signs or symptoms of abuse/neglect since last No Secondary Verification Process Yes visito Completed: Hospitalized since last visit: No Patient Requires Transmission- No Implantable device outside of the clinic excluding No Based Precautions: cellular tissue based products placed in the center Patient Has Alerts: Yes since last visit: Patient Alerts: Patient on Blood Has Dressing in Place as Prescribed: Yes Thinner Pain Present Now: No Electronic Signature(s) Signed: 01/02/2019 12:04:56 PM By: Kela Millin Entered By: Kela Millin on 12/27/2018 13:33:25 -------------------------------------------------------------------------------- Clinic Level of Care Assessment Details Patient Name: Date of Service: Abigail Taylor, Abigail Taylor 12/27/2018 12:45 PM Medical Record Number:[id number] Patient Account Number: [id number] Date of Birth/Sex: Treating RN: 1931/04/16 (85 y.o. Helene Shoe, Meta.Reding Primary Care Theran Vandergrift: [name] Other Clinician: Referring Tyffani Foglesong: Treating Bodi Palmeri/Extender:Robson, Gillermina Phy, Sara Chu in Treatment: 4 Clinic Level of Care Assessment  Items TOOL 4 Quantity Score X - Use when only an EandM is performed on FOLLOW-UP visit 1 0 ASSESSMENTS - Nursing Assessment / Reassessment X - Reassessment of Co-morbidities (includes updates in patient status) 1 10 X - Reassessment of Adherence to Treatment Plan 1 5 ASSESSMENTS - Wound and Skin Assessment / Reassessment X - Simple Wound Assessment / Reassessment - one wound 1 5 []  - Complex Wound Assessment / Reassessment - multiple wounds 0 X - Dermatologic / Skin Assessment (not related to wound area) 1 10 ASSESSMENTS - Focused Assessment []  - Circumferential Edema Measurements - multi extremities 0 X - Nutritional Assessment / Counseling / Intervention 1 10 []  - Lower Extremity Assessment (monofilament, tuning fork, pulses) 0 []  - Peripheral Arterial Disease Assessment (using hand held doppler) 0 ASSESSMENTS - Ostomy and/or Continence Assessment and Care []  - Incontinence Assessment and Management 0 []  - Ostomy Care Assessment and Management (repouching, etc.) 0 PROCESS - Coordination of Care X - Simple Patient / Family Education for ongoing care 1 15 []  - Complex (extensive) Patient / Family Education for ongoing care 0 X - Staff obtains Programmer, systems, Records, Test Results / Process Orders 1 10 X - Staff telephones HHA, Nursing Homes / Clarify orders / etc 1 10 []  - Routine Transfer to another Facility (non-emergent condition) 0 []  - Routine Hospital Admission (non-emergent condition) 0 []  - New Admissions / Biomedical engineer / Ordering NPWT, Apligraf, etc. 0 []  - Emergency Hospital Admission (emergent condition) 0 X - Simple Discharge Coordination 1 10 []  - Complex (extensive) Discharge Coordination 0 PROCESS - Special Needs []  - Pediatric / Minor Patient Management 0 []  - Isolation Patient Management 0 []  - Hearing / Language / Visual special needs 0 []  - Assessment of Community assistance (transportation, D/C planning, etc.) 0 []  - Additional assistance / Altered  mentation 0 []  - Support Surface(s) Assessment (bed, cushion, seat, etc.) 0 INTERVENTIONS - Wound Cleansing / Measurement X - Simple Wound Cleansing - one wound 1 5 []  - Complex Wound Cleansing -  multiple wounds 0 X - Wound Imaging (photographs - any number of wounds) 1 5 []  - Wound Tracing (instead of photographs) 0 X - Simple Wound Measurement - one wound 1 5 []  - Complex Wound Measurement - multiple wounds 0 INTERVENTIONS - Wound Dressings X - Small Wound Dressing one or multiple wounds 1 10 []  - Medium Wound Dressing one or multiple wounds 0 []  - Large Wound Dressing one or multiple wounds 0 []  - Application of Medications - topical 0 []  - Application of Medications - injection 0 INTERVENTIONS - Miscellaneous []  - External ear exam 0 []  - Specimen Collection (cultures, biopsies, blood, body fluids, etc.) 0 []  - Specimen(s) / Culture(s) sent or taken to Lab for analysis 0 []  - Patient Transfer (multiple staff / Civil Service fast streamer / Similar devices) 0 []  - Simple Staple / Suture removal (25 or less) 0 []  - Complex Staple / Suture removal (26 or more) 0 []  - Hypo / Hyperglycemic Management (close monitor of Blood Glucose) 0 []  - Ankle / Brachial Index (ABI) - do not check if billed separately 0 X - Vital Signs 1 5 Has the patient been seen at the hospital within the last three years: Yes Total Score: 115 Level Of Care: New/Established - Level 3 Electronic Signature(s) Signed: 12/27/2018 6:37:14 PM By: Deon Pilling Entered By: Deon Pilling on 12/27/2018 14:08:07 -------------------------------------------------------------------------------- Encounter Discharge Information Details Patient Name: Date of Service: Abigail Taylor 12/27/2018 12:45 PM Medical Record [id number] Patient Account Number: [id number] Date of Birth/Sex: Treating RN: 01/26/1931 (85 y.o. Nancy Fetter Primary Care Nora Sabey: [name] Other Clinician: Referring Jaqwan Wieber: Treating  Levon Penning/Extender:Robson, Gillermina Phy, Sara Chu in Treatment: 4 Encounter Discharge Information Items Discharge Condition: Stable Ambulatory Status: Ambulatory Discharge Destination: Home Transportation: Private Auto Accompanied By: husband Schedule Follow-up Appointment: Yes Clinical Summary of Care: Patient Declined Electronic Signature(s) Signed: 12/31/2018 5:51:44 PM By: Levan Hurst RN, BSN Entered By: Levan Hurst on 12/27/2018 14:15:10 -------------------------------------------------------------------------------- Lower Extremity Assessment Details Patient Name: Date of Service: Abigail Taylor, Abigail Taylor 12/27/2018 12:45 PM Medical Record [id number] Patient Account Number: [id number] Date of Birth/Sex: Treating RN: [birth year] (85 y.o. Clearnce Sorrel Primary Care Kerith Sherley: [name] Other Clinician: Referring Andriel Omalley: Treating Kaydee Magel/Extender:Robson, Gillermina Phy, Sara Chu in Treatment: 4 Electronic Signature(s) Signed: 01/02/2019 12:04:56 PM By: Kela Millin Entered By: Kela Millin on 12/27/2018 13:34:10 -------------------------------------------------------------------------------- Multi Wound Chart Details Patient Name: Date of Service: Abigail Taylor, Abigail Taylor 12/27/2018 12:45 PM Medical Record Number:[id number] Patient Account Number: [id number] Date of Birth/Sex: Treating RN: [birth year] (85 y.o. F) Primary Care Lemoine Goyne: [name] Other Clinician: Referring Jerryl Holzhauer: Treating Tamari Redwine/Extender:Robson, Gillermina Phy, Sara Chu in Treatment: 4 Vital Signs Height(in): 66 Pulse(bpm): 76 Weight(lbs): 120 Blood Pressure(mmHg): 158/98 Body Mass Index(BMI): 19 Temperature(F): 97.8 Respiratory 17 Rate(breaths/min): Photos: [1:No Photos] [N/A:N/A] Wound Location: [1:Sacrum] [N/A:N/A] Wounding Event: [1:Pressure Injury] [N/A:N/A] Primary Etiology: [1:Pressure Ulcer] [N/A:N/A] Comorbid History: [1:Middle ear problems,  Arrhythmia, Hypotension, Osteoarthritis] [N/A:N/A] Date Acquired: [1:09/25/2018] [N/A:N/A] Weeks of Treatment: [1:4] [N/A:N/A] Wound Status: [1:Open] [N/A:N/A] Measurements L x W x D 0.5x0.4x0.3 [N/A:N/A] (cm) Area (cm) : [1:0.157] [N/A:N/A] Volume (cm) : [1:0.047] [N/A:N/A] % Reduction in Area: [1:81.50%] [N/A:N/A] % Reduction in Volume: 44.70% [N/A:N/A] Classification: [1:Category/Stage III] [N/A:N/A] Exudate Amount: [1:Medium] [N/A:N/A] Exudate Type: [1:Serosanguineous] [N/A:N/A] Exudate Color: [1:red, brown] [N/A:N/A] Wound Margin: [1:Flat and Intact] [N/A:N/A] Granulation Amount: [1:Large (67-100%)] [N/A:N/A] Granulation Quality: [1:Pink, Pale] [N/A:N/A] Necrotic Amount: [1:Small (1-33%)] [N/A:N/A] Exposed Structures: [1:Fat Layer (Subcutaneous Tissue) Exposed: Yes Fascia: No Tendon: No Muscle: No Joint: No Bone: No  Small (1-33%)] [N/A:N/A N/A] Treatment Notes Wound #1 (Sacrum) 1. Cleanse With Wound Cleanser 2. Periwound Care Skin Prep 3. Primary Dressing Applied Collegen AG 4. Secondary Dressing Foam Border Dressing Electronic Signature(s) Signed: 12/28/2018 7:57:15 AM By: Linton Ham MD Entered By: Linton Ham on 12/27/2018 14:22:14 -------------------------------------------------------------------------------- [place] Details Patient Name: Date of Service: Abigail Taylor, Abigail Taylor 12/27/2018 12:45 PM Medical Record Number:[id number] Patient Account Number: [id number] Date of Birth/Sex: Treating RN: 04/14/1931 (85 y.o. Helene Shoe, Tammi Klippel Primary Care Jadiel Schmieder: [name] Other Clinician: Referring Trayquan Kolakowski: Treating Lion Fernandez/Extender:Robson, Gillermina Phy, Sara Chu in Treatment: 4 Active Inactive Pressure Nursing Diagnoses: Potential for impaired tissue integrity related to pressure, friction, moisture, and shear Goals: Patient/caregiver will verbalize understanding of pressure ulcer management Date Initiated: 11/29/2018 Target  Resolution Date: 01/25/2019 Goal Status: Active Interventions: Provide education on pressure ulcers Notes: Electronic Signature(s) Signed: 12/27/2018 6:37:14 PM By: Deon Pilling Entered By: Deon Pilling on 12/27/2018 14:00:33 -------------------------------------------------------------------------------- Pain Assessment Details Patient Name: Date of Service: Abigail Taylor, Abigail Taylor 12/27/2018 12:45 PM Medical Record Number:[id number] Patient Account Number: [id number] Date of Birth/Sex: Treating RN: 11-20-1931 (85 y.o. Clearnce Sorrel Primary Care Ayushi Pla: [name] Other Clinician: Referring Lynea Rollison: Treating Todrick Siedschlag/Extender:Robson, Gillermina Phy, Sara Chu in Treatment: 4 Active Problems Location of Pain Severity and Description of Pain Patient Has Paino No Site Locations Pain Management and Medication Current Pain Management: Electronic Signature(s) Signed: 01/02/2019 12:04:56 PM By: Kela Millin Entered By: Kela Millin on 12/27/2018 13:34:04 -------------------------------------------------------------------------------- Patient/Caregiver Education Details Patient Name: Date of Service: Abigail Taylor 12/3/2020andnbsp12:45 PM Medical Record [id number] Patient Account Number: [id number] Date of Birth/Gender: Treating RN: 05/01/1931 (85 y.o. [name] Primary Care Physician: [name] Other Clinician: Referring Physician: Treating Physician/Extender:Robson, Gillermina Phy, Sara Chu in Treatment: 4 Education Assessment Education Provided To: Patient Education Topics Provided Pressure: Handouts: Pressure Ulcers: Care and Offloading Methods: Explain/Verbal Responses: Reinforcements needed Electronic Signature(s) Signed: 12/27/2018 6:37:14 PM By: Deon Pilling Entered By: Deon Pilling on 12/27/2018 14:00:48 -------------------------------------------------------------------------------- Wound Assessment  Details Patient Name: Date of Service: Abigail Taylor, Abigail Taylor 12/27/2018 12:45 PM Medical Record Number:[id number] Patient Account Number: [id number] Date of Birth/Sex: Treating RN: [birth year] (85 y.o. Clearnce Sorrel Primary Care Rayma Hegg: [name] Other Clinician: Referring Cottrell Gentles: Treating Italia Wolfert/Extender:Robson, Gillermina Phy, Sara Chu in Treatment: 4 Wound Status Wound Number: 1 Primary Pressure Ulcer Etiology: Wound Location: Sacrum Wound Open Wounding Event: Pressure Injury Status: Date Acquired: 09/25/2018 Comorbid Middle ear problems, Arrhythmia, Weeks Of Treatment: 4 History: Hypotension, Osteoarthritis Clustered Wound: No Photos Wound Measurements Length: (cm) 0.5 % Reduction in Ar Width: (cm) 0.4 % Reduction in Vo Depth: (cm) 0.3 Epithelialization Area: (cm) 0.157 Tunneling: Volume: (cm) 0.047 Undermining: Wound Description Classification: Category/Stage III Foul Odor After Wound Margin: Flat and Intact Slough/Fibrino Exudate Amount: Medium Exudate Type: Serosanguineous Exudate Color: red, brown Wound Bed Granulation Amount: Large (67-100%) Granulation Quality: Pink, Pale Fascia Exposed: Necrotic Amount: Small (1-33%) Fat Layer (Subcu Necrotic Quality: Adherent Slough Tendon Exposed: Muscle Exposed: Joint Exposed: Bone Exposed: Cleansing: No Yes Exposed Structure No taneous Tissue) Exposed: Yes No No No No ea: 81.5% lume: 44.7% : Small (1-33%) No No Treatment Notes Wound #1 (Sacrum) 1. Cleanse With Wound Cleanser 2. Periwound Care Skin Prep 3. Primary Dressing Applied Collegen AG 4. Secondary Dressing Foam Border Dressing Electronic Signature(s) Signed: 01/01/2019 4:34:42 PM By: Mikeal Hawthorne EMT/HBOT Signed: 01/02/2019 12:04:56 PM By: Kela Millin Entered By: Mikeal Hawthorne on 01/01/2019 11:18:28 -------------------------------------------------------------------------------- Vitals Details Patient  Name: Date of Service: Abigail Taylor 12/27/2018 12:45 PM Medical  Record Number:[id number] Patient Account Number: [id number] Date of Birth/Sex: Treating RN: [birth year] (85 y.o. Clearnce Sorrel Primary Care Alean Kromer: [name] Other Clinician: Referring Aminah Zabawa: Treating Demontray Franta/Extender:Robson, Gillermina Phy, Sara Chu in Treatment: 4 Vital Signs Time Taken: 13:25 Temperature (F): 97.8 Height (in): 66 Pulse (bpm): 64 Weight (lbs): 120 Respiratory Rate (breaths/min): 17 Body Mass Index (BMI): 19.4 Blood Pressure (mmHg): 158/98 Reference Range: 80 - 120 mg / dl Electronic Signature(s) Signed: 01/02/2019 12:04:56 PM By: Kela Millin Entered By: Kela Millin on 12/27/2018 13:33:59

## 2019-01-03 ENCOUNTER — Other Ambulatory Visit: Payer: Self-pay | Admitting: *Deleted

## 2019-01-03 DIAGNOSIS — I4510 Unspecified right bundle-branch block: Secondary | ICD-10-CM | POA: Diagnosis not present

## 2019-01-03 DIAGNOSIS — G20 Parkinson's disease: Secondary | ICD-10-CM | POA: Diagnosis not present

## 2019-01-03 DIAGNOSIS — L89150 Pressure ulcer of sacral region, unstageable: Secondary | ICD-10-CM | POA: Diagnosis not present

## 2019-01-03 DIAGNOSIS — I4891 Unspecified atrial fibrillation: Secondary | ICD-10-CM | POA: Diagnosis not present

## 2019-01-03 DIAGNOSIS — G903 Multi-system degeneration of the autonomic nervous system: Secondary | ICD-10-CM | POA: Diagnosis not present

## 2019-01-03 DIAGNOSIS — I209 Angina pectoris, unspecified: Secondary | ICD-10-CM | POA: Diagnosis not present

## 2019-01-07 ENCOUNTER — Ambulatory Visit (INDEPENDENT_AMBULATORY_CARE_PROVIDER_SITE_OTHER): Payer: Medicare Other | Admitting: Cardiovascular Disease

## 2019-01-07 ENCOUNTER — Other Ambulatory Visit: Payer: Self-pay

## 2019-01-07 ENCOUNTER — Encounter: Payer: Self-pay | Admitting: Cardiovascular Disease

## 2019-01-07 ENCOUNTER — Encounter (INDEPENDENT_AMBULATORY_CARE_PROVIDER_SITE_OTHER): Payer: Self-pay

## 2019-01-07 VITALS — BP 142/90 | HR 63 | Ht 66.0 in | Wt 119.0 lb

## 2019-01-07 DIAGNOSIS — L89150 Pressure ulcer of sacral region, unstageable: Secondary | ICD-10-CM | POA: Diagnosis not present

## 2019-01-07 DIAGNOSIS — M1990 Unspecified osteoarthritis, unspecified site: Secondary | ICD-10-CM | POA: Diagnosis not present

## 2019-01-07 DIAGNOSIS — Z7901 Long term (current) use of anticoagulants: Secondary | ICD-10-CM

## 2019-01-07 DIAGNOSIS — R636 Underweight: Secondary | ICD-10-CM

## 2019-01-07 DIAGNOSIS — E7800 Pure hypercholesterolemia, unspecified: Secondary | ICD-10-CM | POA: Diagnosis not present

## 2019-01-07 DIAGNOSIS — Z9181 History of falling: Secondary | ICD-10-CM | POA: Diagnosis not present

## 2019-01-07 DIAGNOSIS — G903 Multi-system degeneration of the autonomic nervous system: Secondary | ICD-10-CM

## 2019-01-07 DIAGNOSIS — E785 Hyperlipidemia, unspecified: Secondary | ICD-10-CM | POA: Diagnosis not present

## 2019-01-07 DIAGNOSIS — I209 Angina pectoris, unspecified: Secondary | ICD-10-CM | POA: Diagnosis not present

## 2019-01-07 DIAGNOSIS — G8929 Other chronic pain: Secondary | ICD-10-CM | POA: Diagnosis not present

## 2019-01-07 DIAGNOSIS — I4891 Unspecified atrial fibrillation: Secondary | ICD-10-CM | POA: Diagnosis not present

## 2019-01-07 DIAGNOSIS — I080 Rheumatic disorders of both mitral and aortic valves: Secondary | ICD-10-CM | POA: Diagnosis not present

## 2019-01-07 DIAGNOSIS — I480 Paroxysmal atrial fibrillation: Secondary | ICD-10-CM | POA: Diagnosis not present

## 2019-01-07 DIAGNOSIS — I495 Sick sinus syndrome: Secondary | ICD-10-CM

## 2019-01-07 DIAGNOSIS — E876 Hypokalemia: Secondary | ICD-10-CM | POA: Diagnosis not present

## 2019-01-07 DIAGNOSIS — Z79899 Other long term (current) drug therapy: Secondary | ICD-10-CM

## 2019-01-07 DIAGNOSIS — Z950 Presence of cardiac pacemaker: Secondary | ICD-10-CM

## 2019-01-07 DIAGNOSIS — I4510 Unspecified right bundle-branch block: Secondary | ICD-10-CM | POA: Diagnosis not present

## 2019-01-07 DIAGNOSIS — G20 Parkinson's disease: Secondary | ICD-10-CM | POA: Diagnosis not present

## 2019-01-07 DIAGNOSIS — E039 Hypothyroidism, unspecified: Secondary | ICD-10-CM | POA: Diagnosis not present

## 2019-01-07 DIAGNOSIS — K5900 Constipation, unspecified: Secondary | ICD-10-CM | POA: Diagnosis not present

## 2019-01-07 DIAGNOSIS — Z7952 Long term (current) use of systemic steroids: Secondary | ICD-10-CM | POA: Diagnosis not present

## 2019-01-07 DIAGNOSIS — M549 Dorsalgia, unspecified: Secondary | ICD-10-CM | POA: Diagnosis not present

## 2019-01-07 NOTE — Progress Notes (Signed)
Patient ID: Abigail Taylor, female   DOB: Jul 23, 1931, 85 y.o.   MRN: [id number]    Cardiology Office Note    Date:  01/08/2019   ID:  Abigail Taylor, DOB [birth year], MRN [id number]  PCP:  [name], MD  Cardiologist:   [name], MD   Chief Complaint  Patient presents with  . Atrial Fibrillation  . Pacemaker Check    History of Present Illness:  Abigail Taylor is a 85 y.o. female with a history of sinus node dysfunction, recurrent paroxysmal and persistent atrial fibrillation with rapid ventricular response, tachycardia-bradycardia syndrome, status post implantation of a dual-chamber permanent pacemaker (Medtronic Adapta), Orthostatic hypotension related to Parkinson's syndrome/Autonomic dysfunction.    She has not had any cardiovascular problems since her last appointment but has had some orthopedic ones.  In July she was bending over to pick up her dog and suffered a lumbar spine fracture.  She had to wear a brace for 12 weeks.  In September she fell out of bed, thankfully without serious injuries and no head impact.  She has not had any serious bleeding problems while on Eliquis (dose adjusted for age and body habitus)..  The patient specifically denies any chest pain at rest or with her limited level of exertion, dyspnea at rest or with exertion, orthopnea, paroxysmal nocturnal dyspnea, syncope, palpitations, focal neurological deficits, intermittent claudication, lower extremity edema, unexplained weight gain, cough, hemoptysis or wheezing.  Primary limitation to activity remains the Parkinson's disease.  She was losing weight over the summer due to poor appetite, but the trend has reversed somewhat.  She remains underweight.  She is on chronic treatment with amiodarone and had normal liver function tests and TSH level in September.  She is on statin and her LDL cholesterol was 68 also in September.  Her pacemaker function is normal. Her Medtronic Adapta dual-chamber device  was implanted in 2012 and has roughly 9 years of remaining longevity. She is not pacemaker dependent and has roughly 59 % atrial pacing with rather blunted heart rate histograms, does not require ventricular pacing (less than 0.1%). The burden of atrial fibrillation is still quite low at only 1.2%, essentially represented by a single long episode of atrial fibrillation that occurred in September, lasting for 15 hours (the only episode in the last 6 months).  This occurred during her hospitalization after her fall out of bed in September.  She had stopped her amiodarone, but the medication was restarted at that time.  We have not been able to use conventional agents for rate control due to her tendency to orthostatic hypotension.  She had normal coronary arteries by remote angiogram performed in 2001 and a normal nuclear stress test in 2013.    Past Medical History:  Diagnosis Date  . Angina at rest, with the tachycardia 07/05/2011  . Aortic insufficiency 07/05/2011  . Atrial fibrillation ([place])   . Brady-tachy syndrome ([place])    Dual chamber Medtronic pacemaker Adapta  . Chronic back pain   . Constipation 12/27/2011  . Gait instability   . Hyperlipidemia   . Hypothyroidism   . Normal cardiac stress test 2013  . OA (osteoarthritis)   . Pacemaker   . Parkinson's disease ([place])   . Tremor     Past Surgical History:  Procedure Laterality Date  . ABDOMINAL HYSTERECTOMY    . CARDIAC CATHETERIZATION  05/26/1999   normal  . CARDIAC CATHETERIZATION  2001   normal coronary arteries  . cataract    .  COLONOSCOPY N/A 11/15/2012   Procedure: COLONOSCOPY;  Surgeon: [name], MD;  [facility];  Service: Endoscopy;  Laterality: N/A;  1030  . ESOPHAGOGASTRODUODENOSCOPY (EGD) WITH PROPOFOL N/A 11/06/2017   Procedure: ESOPHAGOGASTRODUODENOSCOPY (EGD) WITH PROPOFOL;  Surgeon: [name], MD;  Location[facility];  Service: Endoscopy;  Laterality: N/A;  . HIP ARTHROPLASTY Left  06/24/2016   Procedure: ARTHROPLASTY BIPOLAR HIP (HEMIARTHROPLASTY);  Surgeon: Altamese Clarksville, MD;  Location: [facility];  Service: Orthopedics;  Laterality: Left;  . INSERT / REPLACE / REMOVE PACEMAKER    . NM MYOVIEW LTD  07/19/2011   normal  . PERMANENT PACEMAKER INSERTION  07/05/2011   Medtronic Adapta dual chamber  . PERMANENT PACEMAKER INSERTION N/A 07/05/2011   Procedure: PERMANENT PACEMAKER INSERTION;  Surgeon: [name], MD;  Location: [facility];  Service: Cardiovascular;  Laterality: N/A;    Current Medications: Outpatient Medications Prior to Visit  Medication Sig Dispense Refill  . acetaminophen (TYLENOL) 500 MG tablet Take 1,000 mg by mouth every 6 (six) hours as needed for mild pain.    Marland Kitchen amiodarone (PACERONE) 200 MG tablet Take 1 tablet (200 mg total) by mouth daily.    Marland Kitchen apixaban (ELIQUIS) 2.5 MG TABS tablet Take 1 tablet (2.5 mg total) by mouth 2 (two) times daily. 180 tablet 0  . carbidopa-levodopa (SINEMET CR) 50-200 MG tablet TAKE 1 TABLET BY MOUTH EVERYDAY AT BEDTIME (Patient taking differently: Take 1 tablet by mouth at bedtime. TAKE 1 TABLET BY MOUTH EVERYDAY AT BEDTIME) 90 tablet 1  . Carbidopa-Levodopa ER (SINEMET CR) 25-100 MG tablet controlled release 2 at 7am/2 at 11am/1 at 4pm 450 tablet 1  . doxycycline (VIBRA-TABS) 100 MG tablet Take 100 mg by mouth 2 (two) times daily.    . fludrocortisone (FLORINEF) 0.1 MG tablet TAKE 1 TABLET BY MOUTH EVERY DAY 90 tablet 3  . levothyroxine (SYNTHROID, LEVOTHROID) 75 MCG tablet Take 75 mcg by mouth daily before breakfast.    . LINZESS 145 MCG CAPS capsule Take 145 mcg by mouth daily.  11  . rosuvastatin (CRESTOR) 10 MG tablet Take 10 mg by mouth at bedtime. Reported on 07/09/2015    . XIIDRA 5 % SOLN Place 1 drop into the left eye 2 (two) times daily.     . traMADol (ULTRAM) 50 MG tablet Take 50 mg by mouth every 6 (six) hours as needed for moderate pain.     No facility-administered medications prior to visit.      Allergies:   Patient has no known allergies.   Social History   Socioeconomic History  . Marital status: Married    Spouse name: Not on file  . Number of children: 3  . Years of education: Not on file  . Highest education level: Some college, no degree  Occupational History  . Occupation: retired    Comment: Air cabin crew Tobacco  Tobacco Use  . Smoking status: Never Smoker  . Smokeless tobacco: Never Used  Substance and Sexual Activity  . Alcohol use: No    Alcohol/week: 0.0 standard drinks  . Drug use: No  . Sexual activity: Not on file  Other Topics Concern  . Not on file  Social History Narrative  . Not on file   Social Determinants of Health   Financial Resource Strain:   . Difficulty of Paying Living Expenses: Not on file  Food Insecurity:   . Worried About Charity fundraiser in the Last Year: Not on file  .  Ran Out of Food in the Last Year: Not on file  Transportation Needs:   . Lack of Transportation (Medical): Not on file  . Lack of Transportation (Non-Medical): Not on file  Physical Activity:   . Days of Exercise per Week: Not on file  . Minutes of Exercise per Session: Not on file  Stress:   . Feeling of Stress : Not on file  Social Connections:   . Frequency of Communication with Friends and Family: Not on file  . Frequency of Social Gatherings with Friends and Family: Not on file  . Attends Religious Services: Not on file  . Active Member of Clubs or Organizations: Not on file  . Attends Archivist Meetings: Not on file  . Marital Status: Not on file     Family History:  The patient's family history includes Cancer in her father; Colon cancer (age of onset: 25) in her mother; Healthy in her brother, daughter, and son; Heart attack in her mother; Kidney cancer in her sister; Suicidality in her son.   ROS:   Please see the history of present illness.    ROS All other systems are reviewed and are negative.   PHYSICAL EXAM:   VS:   BP (!) 142/90   Pulse 63   Ht 5\' 6"  (1.676 m)   Wt 119 lb (54 kg)   BMI 19.21 kg/m      General: Alert, oriented x3, no distress, she appears thin and frail and has delayed movement and changes in facial expression related to Parkinson's disease.  The pacemaker site appears healthy. Head: no evidence of trauma, PERRL, EOMI, no exophtalmos or lid lag, no myxedema, no xanthelasma; normal ears, nose and oropharynx Neck: normal jugular venous pulsations and no hepatojugular reflux; brisk carotid pulses without delay and no carotid bruits Chest: clear to auscultation, no signs of consolidation by percussion or palpation, normal fremitus, symmetrical and full respiratory excursions Cardiovascular: normal position and quality of the apical impulse, regular rhythm, normal first and widely split second heart sounds, no murmurs, rubs or gallops Abdomen: no tenderness or distention, no masses by palpation, no abnormal pulsatility or arterial bruits, normal bowel sounds, no hepatosplenomegaly Extremities: no clubbing, cyanosis or edema; 2+ radial, ulnar and brachial pulses bilaterally; 2+ right femoral, posterior tibial and dorsalis pedis pulses; 2+ left femoral, posterior tibial and dorsalis pedis pulses; no subclavian or femoral bruits Neurological: grossly nonfocal Psych: Normal mood and affect    Wt Readings from Last 3 Encounters:  01/07/19 119 lb (54 kg)  10/23/18 113 lb 12.8 oz (51.6 kg)  10/05/18 118 lb 2.7 oz (53.6 kg)      Studies/Labs Reviewed:   EKG:  EKG is not ordered today.  It shows atrial paced, ventricular sensed rhythm with right bundle branch block.  There is long AV delay at 226 ms, QRS is 132 ms, QTC prolonged a little more than expected at 540 ms, likely due to amiodarone. Recent Labs: 10/06/2018: TSH 2.886 10/07/2018: BUN 15; Creatinine, Ser 0.68; Hemoglobin 13.3; Magnesium 1.7; Platelets 232; Potassium 4.0; Sodium 141   Lipid Panel    Component Value Date/Time   CHOL  133 04/30/2015 0853   TRIG 84 04/30/2015 0853   HDL 54 04/30/2015 0853   CHOLHDL 2.5 04/30/2015 0853   VLDL 17 04/30/2015 0853   LDLCALC 62 04/30/2015 0853    ASSESSMENT:    1. Paroxysmal atrial fibrillation (HCC)   2. On amiodarone therapy   3. Long term current use  of anticoagulant   4. SSS (sick sinus syndrome) ([place])   5. Pacemaker   6. Neurogenic orthostatic hypotension (HCC)   7. Hypercholesterolemia   8. Underweight      PLAN:  In order of problems listed above:  1. Afib: She had recurrence of atrial fibrillation with rapid wrist ventricular response when she stopped amiodarone altogether.  She is now back on amiodarone 200 mg daily and does not appear to have the complaints of abdominal discomfort that she had in the past.  If she develops abdominal symptoms again, we can try reducing the dose of 100 mg daily which seemed to control the arrhythmia reasonably well. 2. Amiodarone: Unable to take conventional rate control medications due to orthostatic hypotension.  Will be due for repeat liver tests and TSH in March 2021. 3. Eliquis CHADSVasc 3 (age, gender) , but without previous embolic events.  She has had a few falls, thankfully without serious injuries or bleeding.  Constantly reassess the balance between the bleeding risk versus the embolic risk. 4. SSS: Mostly atrial paced rhythm, heart rate histograms appear appropriate for her sedentary lifestyle. 5. PM: Continue remote downloads every 3 months and yearly office visits. 6. Orthostatic hypotension: Need to avoid dehydration.  She is no longer taking fludrocortisone and her blood pressure is okay today.  Avoid diuretics (including caffeine and alcohol). 7. HLP: All lipid parameters within target range 8. Malnutrition: Remains mildly underweight, but this does not appear to be a worsening trend.    Medication Adjustments/Labs and Tests Ordered: Current medicines are reviewed at length with the patient today.  Concerns  regarding medicines are outlined above.  Medication changes, Labs and Tests ordered today are listed in the Patient Instructions below. Patient Instructions  Medication Instructions:  No changes *If you need a refill on your cardiac medications before your next appointment, please call your pharmacy*  Lab Work: Your provider would like for you to return in [facility] to have the following labs drawn: CMET and TSH. You do not need an appointment for the lab. Once in our office lobby there is a podium where you can sign in and ring the doorbell to alert [place] that you are here. The lab is open from 8:00 am to 4:30 pm; closed for lunch from 12:45pm-1:45pm.  If you have labs (blood work) drawn today and your tests are completely normal, you will receive your results only by: Marland Kitchen MyChart Message (if you have MyChart) OR . A paper copy in the mail If you have any lab test that is abnormal or we need to change your treatment, we will call you to review the results.  Testing/Procedures: None ordered  Follow-Up: At [facility], you and your health needs are our priority.  As part of our continuing mission to provide you with exceptional heart care, we have created designated Provider Care Teams.  These Care Teams include your primary Cardiologist (physician) and Advanced Practice Providers (APPs -  Physician Assistants and Nurse Practitioners) who all work together to provide you with the care you need, when you need it.  Your next appointment:   12 month(s)  The format for your next appointment:   In Person  Provider:   [name], MD       Signed, [name], MD  01/08/2019 5:53 PM    [address], [place], [place]  [address] Phone: [phone]; Fax: [phone]

## 2019-01-07 NOTE — Patient Instructions (Signed)
Medication Instructions:  No changes *If you need a refill on your cardiac medications before your next appointment, please call your pharmacy*  Lab Work: Your provider would like for you to return in [facility] to have the following labs drawn: CMET and TSH. You do not need an appointment for the lab. Once in our office lobby there is a podium where you can sign in and ring the doorbell to alert [place] that you are here. The lab is open from 8:00 am to 4:30 pm; closed for lunch from 12:45pm-1:45pm.  If you have labs (blood work) drawn today and your tests are completely normal, you will receive your results only by: Marland Kitchen MyChart Message (if you have MyChart) OR . A paper copy in the mail If you have any lab test that is abnormal or we need to change your treatment, we will call you to review the results.  Testing/Procedures: None ordered  Follow-Up: At [facility], you and your health needs are our priority.  As part of our continuing mission to provide you with exceptional heart care, we have created designated Provider Care Teams.  These Care Teams include your primary Cardiologist (physician) and Advanced Practice Providers (APPs -  Physician Assistants and Nurse Practitioners) who all work together to provide you with the care you need, when you need it.  Your next appointment:   12 month(s)  The format for your next appointment:   In Person  Provider:   [name], MD

## 2019-01-08 ENCOUNTER — Encounter: Payer: Self-pay | Admitting: Cardiovascular Disease

## 2019-01-10 ENCOUNTER — Other Ambulatory Visit: Payer: Self-pay

## 2019-01-10 ENCOUNTER — Encounter (HOSPITAL_BASED_OUTPATIENT_CLINIC_OR_DEPARTMENT_OTHER): Payer: Medicare Other | Admitting: Internal Medicine

## 2019-01-10 DIAGNOSIS — L89153 Pressure ulcer of sacral region, stage 3: Secondary | ICD-10-CM | POA: Diagnosis not present

## 2019-01-10 NOTE — Progress Notes (Addendum)
Abigail Taylor, AMIEL ([id number]) Visit Report for 01/10/2019 Arrival Information Details Patient Name: Date of Service: Abigail Taylor 01/10/2019 12:45 PM Medical Record Number:[id number] Patient Account Number: [id number] Date of Birth/Sex: Treating RN: 09-Nov-1931 (85 y.o. Clearnce Sorrel Primary Care Equan Cogbill: [name] Other Clinician: Referring Jayton Popelka: Treating Lynnie Koehler/Extender:Robson, Gillermina Phy, Sara Chu in Treatment: 6 Visit Information History Since Last Visit Added or deleted any medications: No Patient Arrived: Gilford Rile Any new allergies or adverse reactions: No Arrival Time: 13:05 Had a fall or experienced change in No Accompanied By: husband activities of daily living that may affect Transfer Assistance: None risk of falls: Patient Identification Verified: Yes Signs or symptoms of abuse/neglect since last No Secondary Verification Process Yes visito Completed: Hospitalized since last visit: No Patient Requires Transmission- No Implantable device outside of the clinic excluding No Based Precautions: cellular tissue based products placed in the center Patient Has Alerts: Yes since last visit: Patient Alerts: Patient on Blood Has Dressing in Place as Prescribed: Yes Thinner Pain Present Now: No Electronic Signature(s) Signed: 01/10/2019 5:33:40 PM By: Kela Millin Entered By: Kela Millin on 01/10/2019 13:05:32 -------------------------------------------------------------------------------- Clinic Level of Care Assessment Details Patient Name: Date of Service: Taylor, Abigail 01/10/2019 12:45 PM Medical Record Number:[id number] Patient Account Number: [id number] Date of Birth/Sex: Treating RN: 03-Mar-1931 (85 y.o. Helene Shoe, Meta.Reding Primary Care Octavion Mollenkopf: [name] Other Clinician: Referring Cort Dragoo: Treating Anaysia Germer/Extender:Robson, Gillermina Phy, Sara Chu in Treatment: 6 Clinic Level of Care Assessment  Items TOOL 4 Quantity Score X - Use when only an EandM is performed on FOLLOW-UP visit 1 0 ASSESSMENTS - Nursing Assessment / Reassessment X - Reassessment of Co-morbidities (includes updates in patient status) 1 10 X - Reassessment of Adherence to Treatment Plan 1 5 ASSESSMENTS - Wound and Skin Assessment / Reassessment X - Simple Wound Assessment / Reassessment - one wound 1 5 []  - Complex Wound Assessment / Reassessment - multiple wounds 0 X - Dermatologic / Skin Assessment (not related to wound area) 1 10 ASSESSMENTS - Focused Assessment []  - Circumferential Edema Measurements - multi extremities 0 X - Nutritional Assessment / Counseling / Intervention 1 10 []  - Lower Extremity Assessment (monofilament, tuning fork, pulses) 0 []  - Peripheral Arterial Disease Assessment (using hand held doppler) 0 ASSESSMENTS - Ostomy and/or Continence Assessment and Care []  - Incontinence Assessment and Management 0 []  - Ostomy Care Assessment and Management (repouching, etc.) 0 PROCESS - Coordination of Care X - Simple Patient / Family Education for ongoing care 1 15 []  - Complex (extensive) Patient / Family Education for ongoing care 0 X - Staff obtains Programmer, systems, Records, Test Results / Process Orders 1 10 X - Staff telephones HHA, Nursing Homes / Clarify orders / etc 1 10 []  - Routine Transfer to another Facility (non-emergent condition) 0 []  - Routine Hospital Admission (non-emergent condition) 0 []  - New Admissions / Biomedical engineer / Ordering NPWT, Apligraf, etc. 0 []  - Emergency Hospital Admission (emergent condition) 0 X - Simple Discharge Coordination 1 10 []  - Complex (extensive) Discharge Coordination 0 PROCESS - Special Needs []  - Pediatric / Minor Patient Management 0 []  - Isolation Patient Management 0 []  - Hearing / Language / Visual special needs 0 []  - Assessment of Community assistance (transportation, D/C planning, etc.) 0 []  - Additional assistance / Altered  mentation 0 []  - Support Surface(s) Assessment (bed, cushion, seat, etc.) 0 INTERVENTIONS - Wound Cleansing / Measurement X - Simple Wound Cleansing - one wound 1 5 []  - Complex Wound Cleansing -  multiple wounds 0 X - Wound Imaging (photographs - any number of wounds) 1 5 []  - Wound Tracing (instead of photographs) 0 X - Simple Wound Measurement - one wound 1 5 []  - Complex Wound Measurement - multiple wounds 0 INTERVENTIONS - Wound Dressings X - Small Wound Dressing one or multiple wounds 1 10 []  - Medium Wound Dressing one or multiple wounds 0 []  - Large Wound Dressing one or multiple wounds 0 []  - Application of Medications - topical 0 []  - Application of Medications - injection 0 INTERVENTIONS - Miscellaneous []  - External ear exam 0 []  - Specimen Collection (cultures, biopsies, blood, body fluids, etc.) 0 []  - Specimen(s) / Culture(s) sent or taken to Lab for analysis 0 []  - Patient Transfer (multiple staff / Civil Service fast streamer / Similar devices) 0 []  - Simple Staple / Suture removal (25 or less) 0 []  - Complex Staple / Suture removal (26 or more) 0 []  - Hypo / Hyperglycemic Management (close monitor of Blood Glucose) 0 []  - Ankle / Brachial Index (ABI) - do not check if billed separately 0 X - Vital Signs 1 5 Has the patient been seen at the hospital within the last three years: Yes Total Score: 115 Level Of Care: New/Established - Level 3 Electronic Signature(s) Signed: 01/10/2019 5:34:18 PM By: Deon Pilling Entered By: Deon Pilling on 01/10/2019 13:40:49 -------------------------------------------------------------------------------- Encounter Discharge Information Details Patient Name: Date of Service: Abigail Taylor 01/10/2019 12:45 PM Medical Record [id number] Patient Account Number: [id number] Date of Birth/Sex: Treating RN: Oct 03, 1931 (85 y.o. [name] Primary Care Yehudis Monceaux: [name] Other Clinician: Referring Jeriah Corkum: Treating  Kritika Stukes/Extender:Robson, Gillermina Phy, Sara Chu in Treatment: 6 Encounter Discharge Information Items Discharge Condition: Stable Ambulatory Status: Walker Discharge Destination: Home Transportation: Private Auto Accompanied By: husband Schedule Follow-up Appointment: No Clinical Summary of Care: Electronic Signature(s) Signed: 01/10/2019 5:34:18 PM By: Deon Pilling Entered By: Deon Pilling on 01/10/2019 13:40:12 -------------------------------------------------------------------------------- Lower Extremity Assessment Details Patient Name: Date of Service: BRIHANA, ZALE 01/10/2019 12:45 PM Medical Record Number:[id number] Patient Account Number: [id number] Date of Birth/Sex: Treating RN: Jun 04, 1931 (85 y.o. Clearnce Sorrel Primary Care Tahsin Benyo: [name] Other Clinician: Referring Shedrick Sarli: Treating Oval Cavazos/Extender:Robson, Gillermina Phy, Sara Chu in Treatment: 6 Electronic Signature(s) Signed: 01/10/2019 5:33:40 PM By: Kela Millin Entered By: Kela Millin on 01/10/2019 13:06:20 -------------------------------------------------------------------------------- Multi-Disciplinary Care Plan Details Patient Name: Date of Service: LIZVETTE, CARMICKLE 01/10/2019 12:45 PM Medical Record Number:[id number] Patient Account Number: [id number] Date of Birth/Sex: Treating RN: 27-Jul-1931 (85 y.o. [name] Primary Care Aminata [place]: [name] Other Clinician: Referring Sabrinia Prien: Treating Kennedy Bohanon/Extender:Robson, Gillermina Phy, Sara Chu in Treatment: 6 Active Inactive Electronic Signature(s) Signed: 01/10/2019 5:34:18 PM By: Deon Pilling Entered By: Deon Pilling on 01/10/2019 13:16:36 -------------------------------------------------------------------------------- Pain Assessment Details Patient Name: Date of Service: JESUSA, DARRINGTON 01/10/2019 12:45 PM Medical Record Number:[id number] Patient Account Number: [id number] Date  of Birth/Sex: Treating RN: 1931/03/08 (85 y.o. Clearnce Sorrel Primary Care Reade Trefz: [name] Other Clinician: Referring Charolette Bultman: Treating Ladon Vandenberghe/Extender:Robson, Gillermina Phy, Sara Chu in Treatment: 6 Active Problems Location of Pain Severity and Description of Pain Patient Has Paino No Site Locations Pain Management and Medication Current Pain Management: Electronic Signature(s) Signed: 01/10/2019 5:33:40 PM By: Kela Millin Entered By: Kela Millin on 01/10/2019 13:06:07 -------------------------------------------------------------------------------- Patient/Caregiver Education Details Patient Name: Abigail Taylor 12/17/2020andnbsp12:45 Date of Service: PM Medical Record [id number] Number: Patient Account Number: [id number] Treating RN: January 07, 1932 (85 y.o. Deon Pilling Date of Birth/Gender: F) Other Clinician: Primary Care [place], Derrek Monaco,  Legrand Como Referring Physician: Physician/Extender: Rolm Bookbinder in Treatment: 6 Education Assessment Education Provided To: Patient Education Topics Provided Pressure: Handouts: Preventing Pressure Ulcers Methods: Explain/Verbal Responses: Reinforcements needed Electronic Signature(s) Signed: 01/10/2019 5:34:18 PM By: Deon Pilling Entered By: Deon Pilling on 01/10/2019 13:08:10 -------------------------------------------------------------------------------- Wound Assessment Details Patient Name: Date of Service: ALIXANDREA, STANCATO 01/10/2019 12:45 PM Medical Record Number:[id number] Patient Account Number: [id number] Date of Birth/Sex: Treating RN: [birth year] (85 y.o. Clearnce Sorrel Primary Care Windle Huebert: [name] Other Clinician: Referring Takiesha Mcdevitt: Treating Zephyra Bernardi/Extender:Robson, Gillermina Phy, Sara Chu in Treatment: 6 Wound Status Wound Number: 1 Primary Pressure Ulcer Etiology: Wound Location: Sacrum Wound Healed -  Epithelialized Wounding Event: Pressure Injury Status: Date Acquired: 09/25/2018 Comorbid Middle ear problems, Arrhythmia, Weeks Of Treatment: 6 History: Hypotension, Osteoarthritis Clustered Wound: No Photos Wound Measurements Length: (cm) 0 % Reductio Width: (cm) 0 % Reductio Depth: (cm) 0 Epithelial Area: (cm) 0 Tunneling Volume: (cm) 0 Undermini Wound Description Classification: Category/Stage III Foul Odor Wound Margin: Flat and Intact Slough/Fi Exudate Amount: None Present Wound Bed Granulation Amount: None Present (0%) Necrotic Amount: None Present (0%) Fascia Exp Fat Layer Tendon Exp Muscle Exp Joint Expo Bone Expos After Cleansing: No brino No Exposed Structure osed: No (Subcutaneous Tissue) Exposed: No osed: No osed: No sed: No ed: No n in Area: 100% n in Volume: 100% ization: Large (67-100%) : No ng: No Electronic Signature(s) Signed: 01/14/2019 6:06:17 PM By: Mikeal Hawthorne EMT/HBOT Signed: 01/16/2019 4:54:21 PM By: Kela Millin Previous Signature: 01/10/2019 5:33:40 PM Version By: Kela Millin Entered By: Mikeal Hawthorne on 01/14/2019 17:08:39 -------------------------------------------------------------------------------- Vitals Details Patient Name: Date of Service: Abigail Taylor. 01/10/2019 12:45 PM Medical Record Number:[id number] Patient Account Number: [id number] Date of Birth/Sex: Treating RN: 08/23/1931 (85 y.o. Clearnce Sorrel Primary Care Julaine Zimny: [name] Other Clinician: Referring Crystale Giannattasio: Treating Geremy Rister/Extender:Robson, Gillermina Phy, Sara Chu in Treatment: 6 Vital Signs Time Taken: 12:45 Temperature (F): 97.7 Height (in): 66 Pulse (bpm): 66 Weight (lbs): 120 Respiratory Rate (breaths/min): 19 Body Mass Index (BMI): 19.4 Blood Pressure (mmHg): 147/69 Reference Range: 80 - 120 mg / dl Electronic Signature(s) Signed: 01/10/2019 5:33:40 PM By: Kela Millin Entered By: Kela Millin on 01/10/2019 13:05:59

## 2019-01-10 NOTE — Progress Notes (Signed)
Abigail Taylor, Abigail Taylor ([id number]) Visit Report for 01/10/2019 HPI Details Patient Name: Date of Service: Abigail Taylor, Abigail Taylor 01/10/2019 12:45 PM Medical Record Number:[id number] Patient Account Number: [id number] Date of Birth/Sex: Treating RN: Sep 01, 1931 (85 y.o. F) Primary Care Provider: [name] Other Clinician: Referring Provider: Treating Provider/Extender:Rufus Cypert, Gillermina Phy, Sara Chu in Treatment: 6 History of Present Illness HPI Description: ADMISSION 11/29/2018 This is an 85 year old woman with Parkinson's disease who arrives in clinic accompanied by her husband. They are here for review of wound on the sacrum. There is very little about this in [place] link although it is referenced in a [facility] communication with I think home care. She is apparently in the hospital in September with cardiac related issues it was just after this that somebody noticed the ulcer. They have been using calcium alginate on the wound and they have advanced home care. They do not have an offloading surface. It is really unclear if the wound is making progression or not. Just yesterday they also noticed a blister on the right lateral malleolus. No doubt this is a pressure related area as well. Past medical history includes atrial fibrillation, tachybradycardia syndrome, syncope, Parkinson's disease and PAD. ABIs in our clinic were not done 11/12; patient's area on the right lateral malleolus seems to have epithelialized over. She has a small oval-shaped wound but it is in a difficult situation right over her coccyx. We have been using silver collagen. She has home care. She claims to be aggressively offloading this area 11/19; right lateral malleolus has remained epithelialized. The area on her coccyx is still small and oval-shaped with adherent debris. We have been using silver collagen 12/3; right lateral malleolus remains closed. The area over her coccyx is smaller but still with 2 to 3 mm  of depth. We have been using silver collagen with improvement with both the patient and her husband say they are meticulously offloading this area 12/17 right lateral malleolus is closed. The area over her coccyx is closed. She can be discharged. Electronic Signature(s) Signed: 01/10/2019 5:01:00 PM By: Linton Ham MD Entered By: Linton Ham on 01/10/2019 13:48:01 -------------------------------------------------------------------------------- Physical Exam Details Patient Name: Date of Service: Abigail Taylor, Abigail Taylor 01/10/2019 12:45 PM Medical Record Number:[id number] Patient Account Number: [id number] Date of Birth/Sex: Treating RN: 01/31/1931 (85 y.o. F) Primary Care Provider: [name] Other Clinician: Referring Provider: Treating Provider/Extender:Vayden Weinand, Gillermina Phy, Sara Chu in Treatment: 6 Constitutional Patient is hypertensive.. Pulse regular and within target range for patient.Marland Kitchen Respirations regular, non-labored and within target range.. Temperature is normal and within the target range for the patient.Marland Kitchen Appears in no distress. Psychiatric appears at normal baseline. Notes Wound exam; area questions on her lower sacrum/coccyx. There is nothing open here currently. Everything is closed over. Electronic Signature(s) Signed: 01/10/2019 5:01:00 PM By: Linton Ham MD Entered By: Linton Ham on 01/10/2019 13:48:34 -------------------------------------------------------------------------------- Physician Orders Details Patient Name: Date of Service: Abigail Taylor, Abigail Taylor 01/10/2019 12:45 PM Medical Record Number:[id number] Patient Account Number: [id number] Date of Birth/Sex: Treating RN: 04-03-1931 (85 y.o. [name] Primary Care Provider: [name] Other Clinician: Referring Provider: Treating Provider/Extender:Latangela Mccomas, Gillermina Phy, Sara Chu in Treatment: 6 Verbal / Phone Orders: No Diagnosis Coding ICD-10 Coding Code  Description L89.153 Pressure ulcer of sacral region, stage 3 Discharge From [facility] Services Discharge from [place] - call if any future wound care needs. Off-Loading Turn and reposition every 2 hours [place] home health - discontinue [facility] for wound care standpoint. Electronic Signature(s) Signed: 01/10/2019  5:01:00 PM By: Linton Ham MD Signed: 01/10/2019 5:34:18 PM By: Deon Pilling Entered By: Deon Pilling on 01/10/2019 13:20:41 -------------------------------------------------------------------------------- Problem List Details Patient Name: Date of Service: Abigail Taylor, Abigail Taylor 01/10/2019 12:45 PM Medical Record [id number] Patient Account Number: [id number] Date of Birth/Sex: Treating RN: Nov 22, 1931 (85 y.o. Abigail Taylor, Abigail Taylor Primary Care Provider: [name] Other Clinician: Referring Provider: Treating Provider/Extender:Miara Emminger, Gillermina Phy, Sara Chu in Treatment: 6 Active Problems ICD-10 Evaluated Encounter Code Description Active Date Today Diagnosis L89.153 Pressure ulcer of sacral region, stage 3 11/29/2018 No Yes Inactive Problems ICD-10 Code Description Active Date Inactive Date L89.512 Pressure ulcer of right ankle, stage 2 11/29/2018 11/29/2018 Resolved Problems Electronic Signature(s) Signed: 01/10/2019 5:01:00 PM By: Linton Ham MD Entered By: Linton Ham on 01/10/2019 13:47:27 -------------------------------------------------------------------------------- Progress Note Details Patient Name: Date of Service: Abigail Taylor 01/10/2019 12:45 PM Medical Record [id number] Patient Account Number: [id number] Date of Birth/Sex: Treating RN: 1931/10/27 (85 y.o. F) Primary Care Provider: [name] Other Clinician: Referring Provider: Treating Provider/Extender:Caylon Saine, Gillermina Phy, Sara Chu in Treatment: 6 Subjective History of Present Illness  (HPI) ADMISSION 11/29/2018 This is an 85 year old woman with Parkinson's disease who arrives in clinic accompanied by her husband. They are here for review of wound on the sacrum. There is very little about this in [place] link although it is referenced in a [facility] communication with I think home care. She is apparently in the hospital in September with cardiac related issues it was just after this that somebody noticed the ulcer. They have been using calcium alginate on the wound and they have advanced home care. They do not have an offloading surface. It is really unclear if the wound is making progression or not. Just yesterday they also noticed a blister on the right lateral malleolus. No doubt this is a pressure related area as well. Past medical history includes atrial fibrillation, tachybradycardia syndrome, syncope, Parkinson's disease and PAD. ABIs in our clinic were not done 11/12; patient's area on the right lateral malleolus seems to have epithelialized over. She has a small oval-shaped wound but it is in a difficult situation right over her coccyx. We have been using silver collagen. She has home care. She claims to be aggressively offloading this area 11/19; right lateral malleolus has remained epithelialized. The area on her coccyx is still small and oval-shaped with adherent debris. We have been using silver collagen 12/3; right lateral malleolus remains closed. The area over her coccyx is smaller but still with 2 to 3 mm of depth. We have been using silver collagen with improvement with both the patient and her husband say they are meticulously offloading this area 12/17 right lateral malleolus is closed. The area over her coccyx is closed. She can be discharged. Objective Constitutional Patient is hypertensive.. Pulse regular and within target range for patient.Marland Kitchen Respirations regular, non-labored and within target range.. Temperature is normal and within the target range  for the patient.Marland Kitchen Appears in no distress. Vitals Time Taken: 12:45 PM, Height: 66 in, Weight: 120 lbs, BMI: 19.4, Temperature: 97.7 F, Pulse: 66 bpm, Respiratory Rate: 19 breaths/min, Blood Pressure: 147/69 mmHg. Psychiatric appears at normal baseline. General Notes: Wound exam; area questions on her lower sacrum/coccyx. There is nothing open here currently. Everything is closed over. Integumentary (Hair, Skin) Wound #1 status is Open. Original cause of wound was Pressure Injury. The wound is located on the Sacrum. The wound measures 0cm length x 0cm width x 0cm depth; 0cm^2 area and 0cm^3 volume. There is no  tunneling or undermining noted. There is a none present amount of drainage noted. The wound margin is flat and intact. There is no granulation within the wound bed. There is no necrotic tissue within the wound bed. Assessment Active Problems ICD-10 Pressure ulcer of sacral region, stage 3 Plan Discharge From [facility] Services: Discharge from [place] - call if any future wound care needs. Off-Loading: Turn and reposition every 2 hours Home Health: Discontinue home health - discontinue [facility] for wound care standpoint. 1. The patient can be discharged from the wound care center 2. Warned to keep pressure off these areas. Except especially difficult given the Parkinson's disease Electronic Signature(s) Signed: 01/10/2019 5:01:00 PM By: Linton Ham MD Entered By: Linton Ham on 01/10/2019 13:49:43 -------------------------------------------------------------------------------- SuperBill Details Patient Name: Date of Service: Abigail Taylor 01/10/2019 Medical Record Number:[id number] Patient Account Number: [id number] Date of Birth/Sex: Treating RN: September 27, 1931 (85 y.o. F) Primary Care Provider: [name] Other Clinician: Referring Provider: Treating Provider/Extender:Keean Wilmeth, Gillermina Phy, Sara Chu in Treatment: 6 Diagnosis  Coding ICD-10 Codes Code Description [phone] Pressure ulcer of sacral region, stage 3 Facility Procedures The patient participates with Medicare or their insurance follows the Medicare Facility Guidelines: CPT4 Code Description Modifier Quantity [id number] [place] VISIT-LEV 3 EST PT 1 Physician Procedures CPT4 Code: [id number] Description: [id number] - WC PHYS LEVEL 2 - EST PT ICD-10 Diagnosis Description L89.153 Pressure ulcer of sacral region, stage 3 Modifier: Quantity: 1 Electronic Signature(s) Signed: 01/10/2019 5:01:00 PM By: Linton Ham MD Signed: 01/10/2019 5:34:18 PM By: Deon Pilling Entered By: Deon Pilling on 01/10/2019 13:51:00

## 2019-01-11 DIAGNOSIS — I4510 Unspecified right bundle-branch block: Secondary | ICD-10-CM | POA: Diagnosis not present

## 2019-01-11 DIAGNOSIS — G20 Parkinson's disease: Secondary | ICD-10-CM | POA: Diagnosis not present

## 2019-01-11 DIAGNOSIS — I4891 Unspecified atrial fibrillation: Secondary | ICD-10-CM | POA: Diagnosis not present

## 2019-01-11 DIAGNOSIS — G903 Multi-system degeneration of the autonomic nervous system: Secondary | ICD-10-CM | POA: Diagnosis not present

## 2019-01-11 DIAGNOSIS — L89150 Pressure ulcer of sacral region, unstageable: Secondary | ICD-10-CM | POA: Diagnosis not present

## 2019-01-11 DIAGNOSIS — I209 Angina pectoris, unspecified: Secondary | ICD-10-CM | POA: Diagnosis not present

## 2019-01-23 ENCOUNTER — Other Ambulatory Visit: Payer: Self-pay | Admitting: Cardiovascular Disease

## 2019-01-24 DIAGNOSIS — G20 Parkinson's disease: Secondary | ICD-10-CM | POA: Diagnosis not present

## 2019-01-24 DIAGNOSIS — E063 Autoimmune thyroiditis: Secondary | ICD-10-CM | POA: Diagnosis not present

## 2019-01-24 DIAGNOSIS — I4891 Unspecified atrial fibrillation: Secondary | ICD-10-CM | POA: Diagnosis not present

## 2019-01-24 DIAGNOSIS — I951 Orthostatic hypotension: Secondary | ICD-10-CM | POA: Diagnosis not present

## 2019-02-01 ENCOUNTER — Telehealth: Payer: Self-pay

## 2019-02-01 ENCOUNTER — Ambulatory Visit (INDEPENDENT_AMBULATORY_CARE_PROVIDER_SITE_OTHER): Payer: Medicare Other | Admitting: *Deleted

## 2019-02-01 DIAGNOSIS — I495 Sick sinus syndrome: Secondary | ICD-10-CM | POA: Diagnosis not present

## 2019-02-01 LAB — CUP PACEART REMOTE DEVICE CHECK
Battery Impedance: 601 Ohm
Battery Remaining Longevity: 91 mo
Battery Voltage: 2.78 V
Brady Statistic AP VP Percent: 0 %
Brady Statistic AP VS Percent: 98 %
Brady Statistic AS VP Percent: 0 %
Brady Statistic AS VS Percent: 2 %
Date Time Interrogation Session: 20210108130619
Implantable Lead Implant Date: 20130611
Implantable Lead Implant Date: 20130611
Implantable Lead Location: 753859
Implantable Lead Location: 753860
Implantable Lead Model: 5076
Implantable Lead Model: 5076
Implantable Pulse Generator Implant Date: 20130611
Lead Channel Impedance Value: 436 Ohm
Lead Channel Impedance Value: 618 Ohm
Lead Channel Pacing Threshold Amplitude: 0.625 V
Lead Channel Pacing Threshold Amplitude: 0.75 V
Lead Channel Pacing Threshold Pulse Width: 0.4 ms
Lead Channel Pacing Threshold Pulse Width: 0.4 ms
Lead Channel Setting Pacing Amplitude: 1.5 V
Lead Channel Setting Pacing Amplitude: 2 V
Lead Channel Setting Pacing Pulse Width: 0.4 ms
Lead Channel Setting Sensing Sensitivity: 5.6 mV

## 2019-02-01 NOTE — Telephone Encounter (Signed)
Left message for patient to remind of missed remote transmission.  

## 2019-02-04 ENCOUNTER — Other Ambulatory Visit: Payer: Self-pay | Admitting: Cardiovascular Disease

## 2019-02-04 ENCOUNTER — Other Ambulatory Visit: Payer: Self-pay | Admitting: *Deleted

## 2019-02-04 NOTE — Patient Outreach (Signed)
[facility]) Care Management  02/04/2019  OSCAR CASADAY [birth year] [id number]    Telephone Assessment-Unsuccessful  RN attempted outreach to pt however unsuccessful. RN able to leave a HIPAA approved voice message to the pt's cell contact requesting a call back.   Plan: Will continue to follow up accordingly and update care plan accordingly.   Raina Mina, RN Care Management Coordinator [place] Office [phone]

## 2019-02-08 NOTE — Progress Notes (Addendum)
Due to the COVID-19 crisis, this virtual check-in visit was done via telephone from my office and it was initiated and consent given by this patient and or family.   Telephone (Audio) Visit The purpose of this telephone visit is to provide medical care while limiting exposure to the novel coronavirus.    Consent was obtained for telephone visit and initiated by pt/family:  Yes.   Answered questions that patient had about telehealth interaction:  Yes.   I discussed the limitations, risks, security and privacy concerns of performing an evaluation and management service by telephone. I also discussed with the patient that there may be a patient responsible charge related to this service. The patient expressed understanding and agreed to proceed.  Pt location: Home Physician Location: office Name of referring provider:  [name], MD I connected with .Abigail Taylor at patients initiation/request on 02/12/2019 at 10:45 AM EST by telephone and verified that I am speaking with the correct person using two identifiers.  Pt MRN:  [id number] Pt DOB:  1931-01-30  Participants:  Abigail Taylor; husband   History of Present Illness:  Patient seen in follow-up for Parkinson's disease.  I stopped her immediate release levodopa last visit because of nausea and started her on extended release.  She reports today that she is no longer nauseated.  Pt denies falls.  Pt denies lightheadedness, near syncope.  She is having some visual hallucinations of people - they are always in the bathroom.  When she gets close to them, they go away.  It can be scary.  "No one wants to hear about it.  They don't believe me that there are people.  I tell them to get out of here."  Mood has been good. Current Movement d/o meds: Carbidopa/levodopa 25/100 CR, 2 tablets at 7 AM, 2 tablets at 11 AM, 1 tablet at 4 PM Carbidopa/levodopa 50/200 at bedtime   Observations/Objective:   There were no vitals filed for this  visit.  She is alert and oriented x 3.  Her speech is fluent and clear   Assessment and Plan:   1.  Idiopathic Parkinson's disease.  The patient has tremor, bradykinesia, rigidity and mild postural instability.  This was dx in approx 2016             -Continue carbidopa/levodopa 25/100 CR, 2 tablets at 7 AM/11 AM/1 tablet at 4 PM              -Continue carbidopa/levodopa 50/200 at bedtime  -Patient having visual hallucinations.  Discussed Nuplazid, but her QT interval is already quite long.  I most certainly do not want to start this without approval from cardiology.  I emailed her cardiologist to ask for guidance/advice.  If her cardiologist does not feel appropriate, then I will likely have to back down on her levodopa, as quetiapine will also increase QT interval.  Abilify can decrease the QT interval, but will make the Parkinson's worse.  ADDENDUM:  Spoke with cardiologist.  He agrees that QT already long from tx with amiodarone and probably not wise to add nuplazid.  Options are limited and none ideal.  Will decrease levodopa to carbidopa/levodopa 25/100 CR, 1 tablet at 7am/11am/4pm and carbidopa/levodopa 25/100 CR at bedtime.             -Discussed Covid vaccine  -We discussed that it used to be thought that levodopa would increase risk of melanoma but now it is believed that Parkinsons itself likely increases risk of  melanoma. she is to get regular skin checks.  -Discussed importance of exercise  2.  RBD             -Wants no medication for this.  Understands better and safety.  3.  Orthostatic hypotension             -Clinically doing well on Florinef  4.  Sialorrhea  -This is commonly associated with PD.  We talked about treatments.  The patient is not a candidate for oral anticholinergic therapy because of increased risk of confusion and falls.  We discussed Botox (type A and B) and 1% atropine drops.  We discusssed that candy like lemon drops can help by stimulating mm of the  oropharynx to induce swallowing.  She wants no medication.  5.  Dry eye             -Seeing ophthalmology, but the drops are too expensive for her to afford.  She is seeing Dr. Valetta Close.  6.Constipation  -discussed nature and pathophysiology and association with PD  -discussed importance of hydration.  Pt is to increase water intake  -pt is given a copy of the rancho recipe  -recommended daily colace  -recommended miralax prn  7. Paroxysmal A. Fib.             -On amiodarone.  Seeing cardiology.   Follow Up Instructions:  pt to call me in a week if doesn't hear from me re: nuplazid.  F/u 2-3 months  -I discussed the assessment and treatment plan with the patient. The patient was provided an opportunity to ask questions and all were answered. The patient agreed with the plan and demonstrated an understanding of the instructions.   The patient was advised to call back or seek an in-person evaluation if the symptoms worsen or if the condition fails to improve as anticipated.    Total Time spent in visit with the patient was:  13 min, of which 100% of the time was spent in counseling as above.   Pt understands and agrees with the plan of care outlined.     Alonza Bogus, DO

## 2019-02-12 ENCOUNTER — Other Ambulatory Visit: Payer: Self-pay

## 2019-02-12 ENCOUNTER — Encounter: Payer: Self-pay | Admitting: Neurology

## 2019-02-12 ENCOUNTER — Telehealth: Payer: Self-pay | Admitting: Neurology

## 2019-02-12 ENCOUNTER — Telehealth (INDEPENDENT_AMBULATORY_CARE_PROVIDER_SITE_OTHER): Payer: Medicare Other | Admitting: Neurology

## 2019-02-12 DIAGNOSIS — R441 Visual hallucinations: Secondary | ICD-10-CM

## 2019-02-12 DIAGNOSIS — G20 Parkinson's disease: Secondary | ICD-10-CM

## 2019-02-12 MED ORDER — CARBIDOPA-LEVODOPA ER 25-100 MG PO TBCR: EXTENDED_RELEASE_TABLET | ORAL | 1 refills | Status: DC

## 2019-02-12 NOTE — Telephone Encounter (Signed)
Let pt know that I spoke with her cardiologist and we decided not to add new drug we talked about on phone this morning.  We will decrease her levodopa to see if we can get rid of the people that she sees in the bathroom (hallucinations).  Please instruct her as follows and let them know that I sent new RX to the pharmacy with proper directions:  decrease levodopa to carbidopa/levodopa 25/100 CR, 1 tablet at 7am/11am/4pm and carbidopa/levodopa 25/100 CR at bedtime (so the daytime and nighttime pill are now the same).  STOP carbidopa/levodopa 50/200 CR at bedtime.

## 2019-02-12 NOTE — Telephone Encounter (Signed)
Abigail Taylor is aware, and on patients dpr

## 2019-02-14 DIAGNOSIS — I739 Peripheral vascular disease, unspecified: Secondary | ICD-10-CM | POA: Diagnosis not present

## 2019-02-20 ENCOUNTER — Other Ambulatory Visit: Payer: Self-pay | Admitting: Cardiovascular Disease

## 2019-02-20 NOTE — Telephone Encounter (Signed)
Rx request sent to pharmacy.  

## 2019-02-24 DIAGNOSIS — E063 Autoimmune thyroiditis: Secondary | ICD-10-CM | POA: Diagnosis not present

## 2019-02-24 DIAGNOSIS — M810 Age-related osteoporosis without current pathological fracture: Secondary | ICD-10-CM | POA: Diagnosis not present

## 2019-02-24 DIAGNOSIS — I4891 Unspecified atrial fibrillation: Secondary | ICD-10-CM | POA: Diagnosis not present

## 2019-02-24 DIAGNOSIS — I951 Orthostatic hypotension: Secondary | ICD-10-CM | POA: Diagnosis not present

## 2019-02-28 DIAGNOSIS — H6983 Other specified disorders of Eustachian tube, bilateral: Secondary | ICD-10-CM | POA: Diagnosis not present

## 2019-03-05 ENCOUNTER — Other Ambulatory Visit: Payer: Self-pay | Admitting: *Deleted

## 2019-03-05 NOTE — Patient Outreach (Signed)
[place] [facility]) Care Management  03/05/2019  Abigail Taylor 1931-12-15 [id number]  Telephone Assessment (post d/c-Provider to completed transition of care).  RN attempted outreach call however unsuccessful. RN able to leave a HIPAA approved voice message requesting a call back.  PLAN: Will send outreach letter and follow up with another call accordingly.   Raina Mina, RN Care Management Coordinator [place] Office [phone]

## 2019-03-05 NOTE — Patient Outreach (Signed)
[facility]) Care Management  01/03/2019  Abigail Taylor 12/08/1931 [id number]    Unsuccessful attempt to reach pt. Able to leave a HIPAA approved voice message. Will continue attempts for assistance with case management services.  Raina Mina, RN Care Management Coordinator [place] Office [phone]

## 2019-03-07 ENCOUNTER — Other Ambulatory Visit: Payer: Self-pay | Admitting: Neurology

## 2019-03-11 ENCOUNTER — Other Ambulatory Visit: Payer: Self-pay | Admitting: *Deleted

## 2019-03-11 NOTE — Patient Outreach (Signed)
[place] [facility]) Care Management  03/11/2019  Abigail Taylor 01-07-1932 [id number]    Telephone Assessment  Several outreach attempts made along with an outreach letter however remains unsuccessful. RN able to leave a HIPPA approved voice message requesting a call back.  PLAN: Will follow workflow protocol and allow 10 days for pt to follow up prior to closing this case. Note this is the 3rd unsuccessful outreach call.  Raina Mina, RN Care Management Coordinator [place] Office [phone]

## 2019-03-18 ENCOUNTER — Other Ambulatory Visit: Payer: Self-pay | Admitting: *Deleted

## 2019-03-18 NOTE — Patient Outreach (Signed)
[facility]) Care Management  03/18/2019  Abigail Taylor Jan 21, 1932 [id number]    Case closure due to unsuccessful contact with several outreach call to this pt and no response to outreach letter sent to this pt. Will notify pt's provider of pt's disposition with [facility] services.  Raina Mina, RN Care Management Coordinator [place] Office [phone]

## 2019-03-20 DIAGNOSIS — S61412A Laceration without foreign body of left hand, initial encounter: Secondary | ICD-10-CM | POA: Diagnosis not present

## 2019-03-20 DIAGNOSIS — Z23 Encounter for immunization: Secondary | ICD-10-CM | POA: Diagnosis not present

## 2019-03-20 DIAGNOSIS — L03114 Cellulitis of left upper limb: Secondary | ICD-10-CM | POA: Diagnosis not present

## 2019-03-24 DIAGNOSIS — M48061 Spinal stenosis, lumbar region without neurogenic claudication: Secondary | ICD-10-CM | POA: Diagnosis not present

## 2019-03-24 DIAGNOSIS — M810 Age-related osteoporosis without current pathological fracture: Secondary | ICD-10-CM | POA: Diagnosis not present

## 2019-03-24 DIAGNOSIS — E063 Autoimmune thyroiditis: Secondary | ICD-10-CM | POA: Diagnosis not present

## 2019-03-24 DIAGNOSIS — I951 Orthostatic hypotension: Secondary | ICD-10-CM | POA: Diagnosis not present

## 2019-03-25 ENCOUNTER — Other Ambulatory Visit: Payer: Self-pay | Admitting: Neurology

## 2019-03-25 NOTE — Telephone Encounter (Signed)
Change in dosage 

## 2019-04-19 NOTE — Progress Notes (Signed)
Assessment/Plan:   1.  Parkinsons Disease  -Would really like to have somebody accompany her to the visits.  I am struggling with how to do this.  Her husband sat outside with her dog.  She states that they bring their dog every time and cannot leave him alone.  They have no access to video visits.  We tried to call the husband, but he had no phone on.  I had my medical assistant go downstairs and find the patient's husband to confirm medication.  He reports that patient is only on one levodopa per day (she is supposed to be on for levodopa) and she thought she was on 8 levodopa per day.  She is supposed to be on 1 tablet 4 times per day.  I wrote on her AVS and asked her to talk/confirm with husband the dosage.    2.  PDD with hallucinations  -Not candidate for Nuplazid because of long QT due to amiodarone.  -She is still having hallucinations, and she really feels they are very real.  She and I discussed this in detail today.  Any of the antipsychotics, with the exception of Abilify are going to lengthen the QT interval.  We talked about Abilify.  We discussed that we could certainly use this, but that it does increase risk of making parkinsonian symptoms worse.  That being said, it may be worth trying it at very low dosages.  She really does not want to do this.  I think, again, that it is worth getting her husband's opinion and really would like to see him involved with the visits.  3.  REM behavior disorder  -This is commonly associated with PD and the patient is experiencing this.  We discussed that this can be very serious and even harmful.  We talked about medications as well as physical barriers to put in the bed (particularly soft bed rails, pillow barriers).  We talked about moving the night stand so that it is not so close to the side of the bed.  4.  Orthostatic hypotension  -On fludrocortisone.  Blood pressure was actually quite high today.  Told her to make sure she is following up  with her prescribing physician for the Florinef (cardiology)  5.  PAF  -Follows with cardiology.  On amiodarone  6.  Follow up is anticipated in the next 4-6 months, sooner should new neurologic issues arise.     Subjective:   Abigail Taylor was seen today in follow up for Parkinsons disease.  I had to fairly significantly decrease her levodopa last visit because of hallucinations.  Cardiologist did not think it was wise to add Nuplazid because her QT was already long from treatment with amiodarone.  I had to drop her from carbidopa/levodopa 25/100 CR 2/2/1 and carbidopa/levodopa 50/200 at bedtime to carbidopa/levodopa 25/100 CR, 1 tablet 4 times per day my previous records were reviewed prior to todays visit as well as outside records available to me.  Pt states that she is taking 3 tablets in the AM, 3 in the afternoon, 2 in the evening.  Pt denies falls.  Pt denies lightheadedness, near syncope.  In regards to hallucinations, patient states that "what I see, the things are real."  She states that the people that were in the bathroom with masks are gone but that she sees plastic doll heads and "he puts those doll heads under this plastic drawers and I can see them but not him."  They  are only there at night and gone in the AM.   "He also brings a black cat."  Current prescribed movement disorder medications: Carbidopa/levodopa 25/100 CR, 1 tablets at 7 AM, 1 tablets at 11 AM, 1 tablet at 4 PM Carbidopa/levodopa CR q hs Florinef 0.1 mg daily.  PREVIOUS MEDICATIONS:  (not candidate for Nuplazid because of long QT because of amiodarone)  ALLERGIES:  No Known Allergies  CURRENT MEDICATIONS:  Outpatient Encounter Medications as of 04/22/2019  Medication Sig  . acetaminophen (TYLENOL) 500 MG tablet Take 1,000 mg by mouth every 6 (six) hours as needed for mild pain.  Marland Kitchen amiodarone (PACERONE) 200 MG tablet TAKE 1 TABLET (200 MG TOTAL) BY MOUTH EVERY OTHER DAY.  . Carbidopa-Levodopa ER (SINEMET  CR) 25-100 MG tablet controlled release 1 at 7am/11am/4pm/bed  . doxycycline (VIBRA-TABS) 100 MG tablet Take 100 mg by mouth 2 (two) times daily.  Marland Kitchen ELIQUIS 2.5 MG TABS tablet TAKE 1 TABLET BY MOUTH TWICE A DAY  . fludrocortisone (FLORINEF) 0.1 MG tablet TAKE 1 TABLET BY MOUTH EVERY DAY  . levothyroxine (SYNTHROID, LEVOTHROID) 75 MCG tablet Take 75 mcg by mouth daily before breakfast.  . LINZESS 145 MCG CAPS capsule Take 145 mcg by mouth daily.  . rosuvastatin (CRESTOR) 10 MG tablet Take 10 mg by mouth at bedtime. Reported on 07/09/2015  . XIIDRA 5 % SOLN Place 1 drop into the left eye 2 (two) times daily.    No facility-administered encounter medications on file as of 04/22/2019.    Objective:   PHYSICAL EXAMINATION:    VITALS:   Vitals:   04/22/19 1431  BP: (!) 187/78  Pulse: 66  SpO2: 97%  Weight: 117 lb (53.1 kg)  Height: 5\' 6"  (1.676 m)    GEN:  The patient appears stated age and is in NAD. HEENT:  Normocephalic, atraumatic.  The mucous membranes are moist. The superficial temporal arteries are without ropiness or tenderness. CV:  rrr Lungs:  CTAB Neck/HEME:  There are no carotid bruits bilaterally.  Neurological examination:  Orientation: The patient is alert and oriented x3.  She recognizes the examiner and asks about her husband (who took care of pt in the hospital) and asks about examiners twins (which pt remembered).   Cranial nerves: There is good facial symmetry with facial hypomimia. The speech is fluent and clear. Soft palate rises symmetrically and there is no tongue deviation. Hearing is intact to conversational tone. Sensation: Sensation is intact to light touch throughout Motor: Strength is at least antigravity x4.  Movement examination: Tone: There is mild increased tone in the RUE.  Tone elsewhere was normal Abnormal movements: none Coordination:  There is mild decremation with RAM's, with toe taps bilaterally Gait and Station: The patient quickly arises  even without her walker.  She even dropped her kleenex and easily bent over to pick up the kleenex    Total time spent on today's visit was 30 minutes, including both face-to-face time and nonface-to-face time.  Time included that spent on review of records (prior notes available to me/labs/imaging if pertinent), discussing treatment and goals, answering patient's questions and coordinating care.  Cc:  [name], MD

## 2019-04-22 ENCOUNTER — Other Ambulatory Visit: Payer: Self-pay

## 2019-04-22 ENCOUNTER — Ambulatory Visit (INDEPENDENT_AMBULATORY_CARE_PROVIDER_SITE_OTHER): Payer: Medicare Other | Admitting: Neurology

## 2019-04-22 ENCOUNTER — Encounter: Payer: Self-pay | Admitting: Neurology

## 2019-04-22 VITALS — BP 187/78 | HR 66 | Ht 66.0 in | Wt 117.0 lb

## 2019-04-22 DIAGNOSIS — G20 Parkinson's disease: Secondary | ICD-10-CM

## 2019-04-22 DIAGNOSIS — R441 Visual hallucinations: Secondary | ICD-10-CM | POA: Diagnosis not present

## 2019-04-22 NOTE — Patient Instructions (Addendum)
1.  Confirm with your husband your current number of tablets of carbidopa/levodopa 25/100 CR.  2.  Talk with your husband about the hallucination medication you and I discussed.  I know that you said you didn't want that right now.  The physicians and staff at [facility] are committed to providing excellent care. You may receive a survey requesting feedback about your experience at our office. We strive to receive "very good" responses to the survey questions. If you feel that your experience would prevent you from giving the office a "very good " response, please contact our office to try to remedy the situation. We may be reached at [phone]. Thank you for taking the time out of your busy day to complete the survey.

## 2019-04-24 DIAGNOSIS — M48061 Spinal stenosis, lumbar region without neurogenic claudication: Secondary | ICD-10-CM | POA: Diagnosis not present

## 2019-04-24 DIAGNOSIS — I951 Orthostatic hypotension: Secondary | ICD-10-CM | POA: Diagnosis not present

## 2019-04-24 DIAGNOSIS — M810 Age-related osteoporosis without current pathological fracture: Secondary | ICD-10-CM | POA: Diagnosis not present

## 2019-04-24 DIAGNOSIS — I4891 Unspecified atrial fibrillation: Secondary | ICD-10-CM | POA: Diagnosis not present

## 2019-05-02 DIAGNOSIS — I739 Peripheral vascular disease, unspecified: Secondary | ICD-10-CM | POA: Diagnosis not present

## 2019-05-03 ENCOUNTER — Ambulatory Visit (INDEPENDENT_AMBULATORY_CARE_PROVIDER_SITE_OTHER): Payer: Medicare Other | Admitting: *Deleted

## 2019-05-03 DIAGNOSIS — I495 Sick sinus syndrome: Secondary | ICD-10-CM | POA: Diagnosis not present

## 2019-05-03 LAB — CUP PACEART REMOTE DEVICE CHECK
Battery Impedance: 651 Ohm
Battery Remaining Longevity: 87 mo
Battery Voltage: 2.79 V
Brady Statistic AP VP Percent: 0 %
Brady Statistic AP VS Percent: 98 %
Brady Statistic AS VP Percent: 0 %
Brady Statistic AS VS Percent: 2 %
Date Time Interrogation Session: 20210409121109
Implantable Lead Implant Date: 20130611
Implantable Lead Implant Date: 20130611
Implantable Lead Location: 753859
Implantable Lead Location: 753860
Implantable Lead Model: 5076
Implantable Lead Model: 5076
Implantable Pulse Generator Implant Date: 20130611
Lead Channel Impedance Value: 413 Ohm
Lead Channel Impedance Value: 618 Ohm
Lead Channel Pacing Threshold Amplitude: 0.625 V
Lead Channel Pacing Threshold Amplitude: 0.75 V
Lead Channel Pacing Threshold Pulse Width: 0.4 ms
Lead Channel Pacing Threshold Pulse Width: 0.4 ms
Lead Channel Setting Pacing Amplitude: 1.5 V
Lead Channel Setting Pacing Amplitude: 2 V
Lead Channel Setting Pacing Pulse Width: 0.4 ms
Lead Channel Setting Sensing Sensitivity: 5.6 mV

## 2019-05-03 NOTE — Progress Notes (Signed)
PPM Remote  

## 2019-05-20 ENCOUNTER — Other Ambulatory Visit: Payer: Self-pay | Admitting: Cardiovascular Disease

## 2019-05-20 NOTE — Telephone Encounter (Signed)
88 F 53.1 kg, SCr 0.68 (09/2018), LOV Croitoru 12/2018

## 2019-05-24 DIAGNOSIS — M48061 Spinal stenosis, lumbar region without neurogenic claudication: Secondary | ICD-10-CM | POA: Diagnosis not present

## 2019-05-24 DIAGNOSIS — I951 Orthostatic hypotension: Secondary | ICD-10-CM | POA: Diagnosis not present

## 2019-05-24 DIAGNOSIS — M810 Age-related osteoporosis without current pathological fracture: Secondary | ICD-10-CM | POA: Diagnosis not present

## 2019-05-24 DIAGNOSIS — E063 Autoimmune thyroiditis: Secondary | ICD-10-CM | POA: Diagnosis not present

## 2019-06-14 DIAGNOSIS — H04123 Dry eye syndrome of bilateral lacrimal glands: Secondary | ICD-10-CM | POA: Diagnosis not present

## 2019-06-23 ENCOUNTER — Other Ambulatory Visit: Payer: Self-pay | Admitting: Neurology

## 2019-06-24 DIAGNOSIS — M48061 Spinal stenosis, lumbar region without neurogenic claudication: Secondary | ICD-10-CM | POA: Diagnosis not present

## 2019-06-24 DIAGNOSIS — I951 Orthostatic hypotension: Secondary | ICD-10-CM | POA: Diagnosis not present

## 2019-06-24 DIAGNOSIS — M810 Age-related osteoporosis without current pathological fracture: Secondary | ICD-10-CM | POA: Diagnosis not present

## 2019-06-24 DIAGNOSIS — E063 Autoimmune thyroiditis: Secondary | ICD-10-CM | POA: Diagnosis not present

## 2019-06-25 NOTE — Telephone Encounter (Signed)
Rx(s) sent to pharmacy electronically.  

## 2019-07-11 DIAGNOSIS — H02532 Eyelid retraction right lower eyelid: Secondary | ICD-10-CM | POA: Diagnosis not present

## 2019-07-11 DIAGNOSIS — H02132 Senile ectropion of right lower eyelid: Secondary | ICD-10-CM | POA: Diagnosis not present

## 2019-07-11 DIAGNOSIS — H02115 Cicatricial ectropion of left lower eyelid: Secondary | ICD-10-CM | POA: Diagnosis not present

## 2019-07-11 DIAGNOSIS — H02535 Eyelid retraction left lower eyelid: Secondary | ICD-10-CM | POA: Diagnosis not present

## 2019-07-11 DIAGNOSIS — H04123 Dry eye syndrome of bilateral lacrimal glands: Secondary | ICD-10-CM | POA: Diagnosis not present

## 2019-07-11 DIAGNOSIS — H02142 Spastic ectropion of right lower eyelid: Secondary | ICD-10-CM | POA: Diagnosis not present

## 2019-07-11 DIAGNOSIS — H02135 Senile ectropion of left lower eyelid: Secondary | ICD-10-CM | POA: Diagnosis not present

## 2019-07-11 DIAGNOSIS — H02145 Spastic ectropion of left lower eyelid: Secondary | ICD-10-CM | POA: Diagnosis not present

## 2019-07-11 DIAGNOSIS — H16213 Exposure keratoconjunctivitis, bilateral: Secondary | ICD-10-CM | POA: Diagnosis not present

## 2019-07-11 DIAGNOSIS — H0015 Chalazion left lower eyelid: Secondary | ICD-10-CM | POA: Diagnosis not present

## 2019-07-11 DIAGNOSIS — H04523 Eversion of bilateral lacrimal punctum: Secondary | ICD-10-CM | POA: Diagnosis not present

## 2019-07-11 DIAGNOSIS — H02112 Cicatricial ectropion of right lower eyelid: Secondary | ICD-10-CM | POA: Diagnosis not present

## 2019-07-24 DIAGNOSIS — M48061 Spinal stenosis, lumbar region without neurogenic claudication: Secondary | ICD-10-CM | POA: Diagnosis not present

## 2019-07-24 DIAGNOSIS — E063 Autoimmune thyroiditis: Secondary | ICD-10-CM | POA: Diagnosis not present

## 2019-07-24 DIAGNOSIS — I951 Orthostatic hypotension: Secondary | ICD-10-CM | POA: Diagnosis not present

## 2019-07-24 DIAGNOSIS — M810 Age-related osteoporosis without current pathological fracture: Secondary | ICD-10-CM | POA: Diagnosis not present

## 2019-08-02 ENCOUNTER — Ambulatory Visit (INDEPENDENT_AMBULATORY_CARE_PROVIDER_SITE_OTHER): Payer: Medicare Other | Admitting: *Deleted

## 2019-08-02 DIAGNOSIS — I4891 Unspecified atrial fibrillation: Secondary | ICD-10-CM | POA: Diagnosis not present

## 2019-08-02 LAB — CUP PACEART REMOTE DEVICE CHECK
Battery Impedance: 727 Ohm
Battery Remaining Longevity: 83 mo
Battery Voltage: 2.78 V
Brady Statistic AP VP Percent: 0 %
Brady Statistic AP VS Percent: 97 %
Brady Statistic AS VP Percent: 0 %
Brady Statistic AS VS Percent: 3 %
Date Time Interrogation Session: 20210709111829
Implantable Lead Implant Date: 20130611
Implantable Lead Implant Date: 20130611
Implantable Lead Location: 753859
Implantable Lead Location: 753860
Implantable Lead Model: 5076
Implantable Lead Model: 5076
Implantable Pulse Generator Implant Date: 20130611
Lead Channel Impedance Value: 435 Ohm
Lead Channel Impedance Value: 647 Ohm
Lead Channel Pacing Threshold Amplitude: 0.75 V
Lead Channel Pacing Threshold Amplitude: 0.75 V
Lead Channel Pacing Threshold Pulse Width: 0.4 ms
Lead Channel Pacing Threshold Pulse Width: 0.4 ms
Lead Channel Setting Pacing Amplitude: 1.5 V
Lead Channel Setting Pacing Amplitude: 2 V
Lead Channel Setting Pacing Pulse Width: 0.4 ms
Lead Channel Setting Sensing Sensitivity: 5.6 mV

## 2019-08-05 NOTE — Progress Notes (Signed)
Remote pacemaker transmission.   

## 2019-08-08 ENCOUNTER — Telehealth: Payer: Self-pay

## 2019-08-08 DIAGNOSIS — I739 Peripheral vascular disease, unspecified: Secondary | ICD-10-CM | POA: Diagnosis not present

## 2019-08-08 NOTE — Telephone Encounter (Signed)
   Primary Cardiologist: [name], MD  Left a voicemail for patient to call back for ongoing preop assessment.  Abigail Butts, PA-C 08/08/2019, 2:05 PM

## 2019-08-08 NOTE — Telephone Encounter (Signed)
   [facility] Pre-operative Risk Assessment    Request for surgical clearance:  1. What type of surgery is being performed? LEFT LOWER ECTROPION REPAIR with LEFT LOWER EYELID CHALAZION EXCISION  2. When is this surgery scheduled? 08-19-2019   3. What type of clearance is required (medical clearance vs. Pharmacy clearance to hold med vs. Both)? BOTH  4. Are there any medications that need to be held prior to surgery and how long? ELIQUIS   5. Practice name and name of physician performing surgery? [facility] Isidoro Donning, MD   6. What is the office phone number? [phone]   7.   What is the office fax number? [phone]  8.   Anesthesia type (None, local, MAC, general) ? MAC   Waylan Rocher 08/08/2019, 11:57 AM

## 2019-08-08 NOTE — Telephone Encounter (Signed)
Patient with diagnosis of afib on Eliquis for anticoagulation.    Procedure: LEFT LOWER ECTROPION REPAIR with LEFT LOWER EYELID CHALAZION EXCISION Date of procedure: 08/19/19  CHADS2-VASc score of 4 (age x2, sex, HTN complicated by orthostatic hypotension)  CrCl 52mL/min Platelet count 232K  Per office protocol, patient can hold Eliquis for 1-2 days prior to procedure.

## 2019-08-09 ENCOUNTER — Encounter: Payer: Self-pay | Admitting: Cardiovascular Disease

## 2019-08-09 NOTE — Progress Notes (Signed)
PERIOPERATIVE PRESCRIPTION FOR IMPLANTED CARDIAC DEVICE PROGRAMMING  Patient Information: Name:  Abigail Taylor  DOB:  04-16-1931  MRN:  [id number]    1. What type of surgery is being performed? LEFT LOWER ECTROPION REPAIR with LEFT LOWER EYELID CHALAZION EXCISION  2. When is this surgery scheduled? 08-19-2019   3. What type of clearance is required (medical clearance vs. Pharmacy clearance to hold med vs. Both)? BOTH  4. Are there any medications that need to be held prior to surgery and how long? ELIQUIS   5. Practice name and name of physician performing surgery? [facility] Isidoro Donning, MD   6. What is the office phone number? [phone]   7.   What is the office fax number? [phone]  8.   Anesthesia type (None, local, MAC, general) ? MAC Device Information:  Clinic EP Physician:  Dr. Dani Gobble Croitoru    Device Type:  Pacemaker Manufacturer and Phone #:  Medtronic: [id number] Pacemaker Dependent?:  No. Date of Last Device Check:  08/02/2019 Normal Device Function?:  Yes.    Electrophysiologist's Recommendations:   Have magnet available.  Provide continuous ECG monitoring when magnet is used or reprogramming is to be performed.   Procedure may interfere with device function.  Magnet should be placed over device during procedure.  Per Device Clinic Standing Orders, York Ram, RN  12:47 PM 08/09/2019

## 2019-08-09 NOTE — Progress Notes (Signed)
Form sent via Right fax to [phone]

## 2019-08-09 NOTE — Telephone Encounter (Signed)
   Primary Cardiologist: [name], MD  Chart reviewed as part of pre-operative protocol coverage. Patient was contacted 08/09/2019 in reference to pre-operative risk assessment for pending surgery as outlined below.  Abigail Taylor was last seen on 12/2018 by Dr. Sallyanne Kuster. 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 She had normal coronary arteries by remote angiogram performed in 2001 and a normal nuclear stress test in 2013. I spoke with the patient and her husband. They affirm she has had no new cardiac symptoms of CP, SOB, palpitations. Recent device interrogation 7/9 was unrevealing. Therefore, based on ACC/AHA guidelines, the patient would be at acceptable risk for the planned procedure without further cardiovascular testing.   Per pharmD review, Per office protocol, patient can hold Eliquis for 1-2 days prior to procedure.    I will route this recommendation to the requesting party via Epic fax function and remove from pre-op pool. Will also route to device clinic in case any further input needed on their end given patient's pacemaker. Please call with questions.  Charlie Pitter, PA-C 08/09/2019, 9:13 AM

## 2019-08-09 NOTE — Telephone Encounter (Signed)
Doren Custard the patient's husband is returning Krista's call in regards to Peachie's surgical clearance. Please advise.

## 2019-08-21 DIAGNOSIS — Z23 Encounter for immunization: Secondary | ICD-10-CM | POA: Diagnosis not present

## 2019-08-23 ENCOUNTER — Other Ambulatory Visit: Payer: Self-pay | Admitting: Cardiovascular Disease

## 2019-08-23 DIAGNOSIS — M48061 Spinal stenosis, lumbar region without neurogenic claudication: Secondary | ICD-10-CM | POA: Diagnosis not present

## 2019-08-23 DIAGNOSIS — M810 Age-related osteoporosis without current pathological fracture: Secondary | ICD-10-CM | POA: Diagnosis not present

## 2019-08-23 DIAGNOSIS — I951 Orthostatic hypotension: Secondary | ICD-10-CM | POA: Diagnosis not present

## 2019-08-23 DIAGNOSIS — E063 Autoimmune thyroiditis: Secondary | ICD-10-CM | POA: Diagnosis not present

## 2019-08-23 NOTE — Telephone Encounter (Signed)
Prescription refill request for Eliquis received. Indication: Atrial Fibrillation Last office visit: 02/01/2019 Dr Sallyanne Kuster Scr: 0.68 09/2018 Age: 86  Weight: 53.1 kg  Prescription refilled

## 2019-08-28 DIAGNOSIS — I4891 Unspecified atrial fibrillation: Secondary | ICD-10-CM | POA: Diagnosis not present

## 2019-08-28 DIAGNOSIS — G20 Parkinson's disease: Secondary | ICD-10-CM | POA: Diagnosis not present

## 2019-08-28 DIAGNOSIS — K219 Gastro-esophageal reflux disease without esophagitis: Secondary | ICD-10-CM | POA: Diagnosis not present

## 2019-08-28 DIAGNOSIS — R05 Cough: Secondary | ICD-10-CM | POA: Diagnosis not present

## 2019-08-30 ENCOUNTER — Other Ambulatory Visit: Payer: Self-pay

## 2019-08-30 ENCOUNTER — Emergency Department (HOSPITAL_COMMUNITY): Payer: Medicare Other

## 2019-08-30 ENCOUNTER — Encounter: Payer: Self-pay | Admitting: *Deleted

## 2019-08-30 ENCOUNTER — Encounter (HOSPITAL_COMMUNITY): Payer: Self-pay | Admitting: Emergency Medicine

## 2019-08-30 ENCOUNTER — Emergency Department (HOSPITAL_COMMUNITY)
Admission: EM | Admit: 2019-08-30 | Discharge: 2019-08-30 | Disposition: A | Discharge status: Home / Self Care | Payer: Medicare Other | Attending: Emergency Medicine | Admitting: Emergency Medicine

## 2019-08-30 DIAGNOSIS — Z7951 Long term (current) use of inhaled steroids: Secondary | ICD-10-CM | POA: Diagnosis not present

## 2019-08-30 DIAGNOSIS — G20 Parkinson's disease: Secondary | ICD-10-CM | POA: Insufficient documentation

## 2019-08-30 DIAGNOSIS — B9789 Other viral agents as the cause of diseases classified elsewhere: Secondary | ICD-10-CM | POA: Diagnosis not present

## 2019-08-30 DIAGNOSIS — Z79899 Other long term (current) drug therapy: Secondary | ICD-10-CM | POA: Insufficient documentation

## 2019-08-30 DIAGNOSIS — J069 Acute upper respiratory infection, unspecified: Secondary | ICD-10-CM | POA: Diagnosis not present

## 2019-08-30 DIAGNOSIS — R05 Cough: Secondary | ICD-10-CM | POA: Diagnosis present

## 2019-08-30 DIAGNOSIS — Z950 Presence of cardiac pacemaker: Secondary | ICD-10-CM | POA: Diagnosis not present

## 2019-08-30 DIAGNOSIS — J984 Other disorders of lung: Secondary | ICD-10-CM | POA: Diagnosis not present

## 2019-08-30 DIAGNOSIS — J8489 Other specified interstitial pulmonary diseases: Secondary | ICD-10-CM | POA: Diagnosis not present

## 2019-08-30 DIAGNOSIS — M19011 Primary osteoarthritis, right shoulder: Secondary | ICD-10-CM | POA: Diagnosis not present

## 2019-08-30 DIAGNOSIS — I700 Atherosclerosis of aorta: Secondary | ICD-10-CM | POA: Diagnosis not present

## 2019-08-30 DIAGNOSIS — Z20822 Contact with and (suspected) exposure to covid-19: Secondary | ICD-10-CM | POA: Diagnosis not present

## 2019-08-30 DIAGNOSIS — E039 Hypothyroidism, unspecified: Secondary | ICD-10-CM | POA: Diagnosis not present

## 2019-08-30 DIAGNOSIS — I10 Essential (primary) hypertension: Secondary | ICD-10-CM | POA: Diagnosis not present

## 2019-08-30 DIAGNOSIS — Z7901 Long term (current) use of anticoagulants: Secondary | ICD-10-CM | POA: Diagnosis not present

## 2019-08-30 LAB — SARS CORONAVIRUS 2 BY RT PCR (HOSPITAL ORDER, PERFORMED IN ~~LOC~~ HOSPITAL LAB): SARS Coronavirus 2: NEGATIVE

## 2019-08-30 NOTE — ED Triage Notes (Signed)
Patient states congestion started a week ago.  States husband had a cold and now she does.  States she saw Dr. Gerarda Fraction and he gave her 2 medication that have not helped.  Patient has a congested cough, but states she is not coughing and is not coughing anything up.

## 2019-08-30 NOTE — ED Provider Notes (Signed)
[facility] EMERGENCY DEPARTMENT Provider Note   CSN: [id number] Arrival date & time: 08/30/19  5035     History No chief complaint on file.   Abigail Taylor is a 86 y.o. female.  HPI      Abigail Taylor is a 86 y.o. female with past medical history of atrial fibrillation (anticoagulated on Eliquis), angina, hyperlipidemia, and Parkinson's.  Who presents to the Emergency Department complaining of cough and congestion for several days.  She states that her cough is mostly nonproductive, but endorses hearing a "rattle" in her chest when she coughs.  She states she was recently seen by her PCP and started on doxycycline 2 days ago.  She contacted his office today and was advised to come to the emergency room for further evaluation.  She denies chest pain, shortness of breath, fever, chills, lower extremity edema, nausea or vomiting.  No known Covid exposures.  She is partially vaccinated for covid.   Past Medical History:  Diagnosis Date  . Angina at rest, with the tachycardia 07/05/2011  . Aortic insufficiency 07/05/2011  . Atrial fibrillation ([place])   . Brady-tachy syndrome ([place])    Dual chamber Medtronic pacemaker Adapta  . Chronic back pain   . Constipation 12/27/2011  . Gait instability   . Hyperlipidemia   . Hypothyroidism   . Normal cardiac stress test 2013  . OA (osteoarthritis)   . Pacemaker   . Parkinson's disease ([place])   . Tremor     Patient Active Problem List   Diagnosis Date Noted  . Pressure injury of skin 10/05/2018  . Atrial fibrillation with rapid ventricular response ([place]) 10/04/2018  . Chronic anticoagulation   . Syncope   . Anticoagulated   . Epigastric pain   . Diverticulosis   . Melena 11/04/2017  . Atrial fibrillation with RVR ([place]) 09/29/2017  . RBD (REM behavioral disorder) 01/10/2017  . Neurogenic orthostatic hypotension ([place]) 01/10/2017  . Tachy-brady syndrome ([place]) 06/23/2016  . Closed subcapital fracture of left femur ([place]) 06/23/2016  .  Orthostatic hypotension due to Parkinson's disease ([place]) 06/23/2016  . PD (Parkinson's disease) ([place]) 04/21/2016  . Hypokalemia 04/21/2016  . Normal coronary arteries 2001 05/08/2014  . Pacemaker 08/10/2012  . Aortic insufficiency 08/10/2012  . Constipation 12/27/2011  . Family hx of colon cancer 12/27/2011  . HTN (hypertension) 07/06/2011  . Hypothyroidism 07/05/2011  . Paroxysmal atrial fibrillation ([place]) 07/05/2011  . Sinus pause, 7 seconds post conversion, S/P MDT pacemaker 07/05/11 07/05/2011  . Dyslipidemia 07/05/2011  . Angina at rest, with rapid AF 07/05/2011    Past Surgical History:  Procedure Laterality Date  . ABDOMINAL HYSTERECTOMY    . CARDIAC CATHETERIZATION  05/26/1999   normal  . CARDIAC CATHETERIZATION  2001   normal coronary arteries  . cataract    . COLONOSCOPY N/A 11/15/2012   Procedure: COLONOSCOPY;  Surgeon: [name], MD;  Location: [facility];  Service: Endoscopy;  Laterality: N/A;  1030  . ESOPHAGOGASTRODUODENOSCOPY (EGD) WITH PROPOFOL N/A 11/06/2017   Procedure: ESOPHAGOGASTRODUODENOSCOPY (EGD) WITH PROPOFOL;  Surgeon: [name], MD;  Location: [facility];  Service: Endoscopy;  Laterality: N/A;  . HIP ARTHROPLASTY Left 06/24/2016   Procedure: ARTHROPLASTY BIPOLAR HIP (HEMIARTHROPLASTY);  Surgeon: Altamese , MD;  Location: [place];  Service: Orthopedics;  Laterality: Left;  . INSERT / REPLACE / REMOVE PACEMAKER    . NM MYOVIEW LTD  07/19/2011   normal  . PERMANENT PACEMAKER INSERTION  07/05/2011   Medtronic Adapta dual  chamber  . PERMANENT PACEMAKER INSERTION N/A 07/05/2011   Procedure: PERMANENT PACEMAKER INSERTION;  Surgeon: [name], MD;  Location: [facility];  Service: Cardiovascular;  Laterality: N/A;     OB History   No obstetric history on file.     Family History  Problem Relation Age of Onset  . Colon cancer Mother 24  . Heart attack Mother   . Cancer Father   . Kidney cancer Sister   . Healthy Brother   .  Healthy Son   . Healthy Daughter   . Suicidality Son     Social History   Tobacco Use  . Smoking status: Never Smoker  . Smokeless tobacco: Never Used  Substance Use Topics  . Alcohol use: No    Alcohol/week: 0.0 standard drinks  . Drug use: No    Home Medications Prior to Admission medications   Medication Sig Start Date End Date Taking? Authorizing Provider  acetaminophen (TYLENOL) 500 MG tablet Take 1,000 mg by mouth every 6 (six) hours as needed for mild pain.    [provider]  amiodarone (PACERONE) 200 MG tablet TAKE 1 TABLET (200 MG TOTAL) BY MOUTH EVERY OTHER DAY. 02/20/19   Croitoru, Mihai, MD  Carbidopa-Levodopa ER (SINEMET CR) 25-100 MG tablet controlled release TAKE 1 TABLETS BY MOUTH AT 7 AM,1 at 11am/1 at 4pm/1 at bedtime 06/25/19   Tat, Eustace Quail, DO  doxycycline (VIBRA-TABS) 100 MG tablet Take 100 mg by mouth 2 (two) times daily. 10/10/18   [provider]  ELIQUIS 2.5 MG TABS tablet TAKE 1 TABLET BY MOUTH TWICE A DAY 08/23/19   Croitoru, Mihai, MD  fludrocortisone (FLORINEF) 0.1 MG tablet TAKE 1 TABLET BY MOUTH EVERY DAY 02/20/19   Croitoru, Mihai, MD  levothyroxine (SYNTHROID, LEVOTHROID) 75 MCG tablet Take 75 mcg by mouth daily before breakfast.    [provider]  LINZESS 145 MCG CAPS capsule Take 145 mcg by mouth daily. 12/14/17   [provider]  rosuvastatin (CRESTOR) 10 MG tablet Take 10 mg by mouth at bedtime. Reported on 07/09/2015    [provider]  XIIDRA 5 % SOLN Place 1 drop into the left eye 2 (two) times daily.  10/04/16   [provider]    Allergies    Patient has no known allergies.  Review of Systems   Review of Systems  Constitutional: Negative for appetite change, chills and fever.  HENT: Positive for congestion. Negative for rhinorrhea, sore throat and trouble swallowing.   Respiratory: Positive for cough and chest tightness. Negative for shortness of breath and wheezing.   Cardiovascular:  Negative for chest pain and leg swelling.  Gastrointestinal: Negative for abdominal pain, diarrhea, nausea and vomiting.  Genitourinary: Negative for dysuria.  Musculoskeletal: Negative for arthralgias.  Skin: Negative for rash.  Neurological: Negative for dizziness, weakness and numbness.  Hematological: Negative for adenopathy.    Physical Exam Updated Vital Signs BP (!) 164/67 (BP Location: Right Arm)   Pulse 60   Temp 97.8 F (36.6 C) (Oral)   Resp 20   Ht 5\' 6"  (1.676 m)   Wt 59 kg   BMI 20.98 kg/m   Physical Exam Vitals and nursing note reviewed.  Constitutional:      General: She is not in acute distress.    Appearance: Normal appearance. She is not toxic-appearing.     Comments: Elderly appearing female  HENT:     Right Ear: Tympanic membrane and ear canal normal.  Left Ear: Tympanic membrane and ear canal normal.     Nose: Nose normal.     Mouth/Throat:     Mouth: Mucous membranes are moist.  Eyes:     Conjunctiva/sclera: Conjunctivae normal.     Pupils: Pupils are equal, round, and reactive to light.  Cardiovascular:     Rate and Rhythm: Normal rate and regular rhythm.     Pulses: Normal pulses.  Pulmonary:     Effort: Pulmonary effort is normal. No respiratory distress.     Breath sounds: No wheezing.     Comments: Coarse lung sounds bilaterally that clear with cough.  No wheezing or rales. Chest:     Chest wall: No tenderness.  Abdominal:     General: There is no distension.     Palpations: Abdomen is soft.     Tenderness: There is no abdominal tenderness.  Musculoskeletal:     Cervical back: Normal range of motion.     Right lower leg: No edema.     Left lower leg: No edema.  Skin:    General: Skin is warm.     Capillary Refill: Capillary refill takes less than 2 seconds.     Findings: No rash.  Neurological:     General: No focal deficit present.     Mental Status: She is alert.     Sensory: Sensation is intact.     Motor: No weakness.      Coordination: Coordination is intact.     Gait: Gait is intact.     Comments: CN II through XII grossly intact.  Speech clear.  No pronator drift.     ED Results / Procedures / Treatments   Labs (all labs ordered are listed, but only abnormal results are displayed) Labs Reviewed  SARS CORONAVIRUS 2 BY RT PCR ([place], [place])    EKG None  Radiology DG Chest Portable 1 View  Result Date: 08/30/2019 CLINICAL DATA:  Cough, congestion EXAM: PORTABLE CHEST 1 VIEW COMPARISON:  09/05/2018 FINDINGS: Left-sided implanted cardiac device. The heart size and mediastinal contours are within normal limits. Atherosclerotic calcification of the aortic knob. Mildly hyperinflated lungs with chronically coarsened interstitial markings. Unchanged scarring within the periphery of the right upper lobe. No new focal airspace consolidation. No pleural effusion or pneumothorax. Chronic mild height loss of the T7 vertebral body. Degenerative changes of the right shoulder. IMPRESSION: Chronic lung changes without acute superimposed process. Electronically Signed   By: Davina Poke D.O.   On: 08/30/2019 11:05    Procedures Procedures (including critical care time)  Medications Ordered in ED Medications - No data to display  ED Course  I have reviewed the triage vital signs and the nursing notes.  Pertinent labs & imaging results that were available during my care of the patient were reviewed by me and considered in my medical decision making (see chart for details).    MDM Rules/Calculators/A&P                         Patient here with cough and chest congestion for several days.  She was seen by her PCP and started on doxycycline 2 days ago for what she describes as bronchitis.  She is well-appearing, nontoxic.  No hypoxia tachycardia or tachypnea.  Doubt PE.  Patient states that her spouse has recently had similar symptoms.  She is partially vaccinated for Covid.   On exam, lung sounds are coarse but  clear with cough.  Will obtain Covid test and chest x-ray.  On recheck, patient resting comfortably denies symptoms at present.  X-ray negative for pneumonia and Covid test negative.  She has ambulated in the department without difficulty or hypoxia.  On recheck she is noted to be hypertensive.  She states that she is aware that her blood pressure has been elevated recently.  She denies any over-the-counter decongestants.  No associated symptoms at present, no concerning symptoms for ACS or CHF.  I feel that she is appropriate for discharge home, I have discussed the importance of close follow-up with her PCP regarding her blood pressure.  She verbalized understanding agrees to plan.  Strict return precautions were given.  She will continue her current antibiotic course.   Final Clinical Impression(s) / ED Diagnoses Final diagnoses:  Viral URI with cough  Hypertension, unspecified type    Rx / DC Orders ED Discharge Orders    None       Kem Parkinson, PA-C 08/30/19 1210    Margette Fast, MD 08/31/19 [phone]

## 2019-08-30 NOTE — Discharge Instructions (Addendum)
Your x-ray today did not show any evidence of pneumonia.  Your Covid test was negative.  It is important that you continue taking your doxycycline as directed.  Try to drink plenty of water.  You may take over-the-counter Tylenol, 1 tablet every 4-6 hours if needed for fever or body aches.  Your blood pressure today is elevated.  It is important that you take your medications regularly and as prescribed.  Follow-up with Dr. Riley Kill next week.  Return to the emergency department if you develop worsening symptoms such as fever, chest pain, or shortness of breath.

## 2019-09-06 ENCOUNTER — Telehealth: Payer: Self-pay | Admitting: Cardiovascular Disease

## 2019-09-06 NOTE — Telephone Encounter (Signed)
Spoke to husband . He states patient had  Labs done at PCP  lipid, CMP, CBC, Folate, corstol , TSH in June 2021.  patient received a letter from office requesting  TSH, CMP.  RN informed husband patient does not need to have labs requested.   RN called [facility] - labs will bed faxed.  labs will be placed in Dr [employer] box

## 2019-09-06 NOTE — Telephone Encounter (Signed)
Patient is calling to inquire about whether she needs to have additional lab work done. She states she received a call from our office about lab work. However, she had lab work done with her PCP about 4 weeks ago. Please return call.

## 2019-09-09 ENCOUNTER — Telehealth: Payer: Self-pay | Admitting: Cardiovascular Disease

## 2019-09-09 NOTE — Telephone Encounter (Signed)
Patient's husband calling to schedule a hospital follow up. The next available with Dr. Sallyanne Kuster was in October and he would like a nurse to call him back to see if she can be seen sooner. He states she was at [facility] on 08/30/2019.

## 2019-09-09 NOTE — Telephone Encounter (Signed)
Pts husband called to report that the pt has been having a lot of problems with her sinuses and Dr. Gerarda Fraction has been treating her with a lot of "new  Meds" and "heavy duty" meds and he has noticed that her BP has been going up.. he does not have any readings except it was 184/77 yesterday. He called Dr. Gerarda Fraction and he has an appt with his APP this week 09/13/19.   I have went ahead and made her a follow up appt with Dr. Sallyanne Kuster per his request for 09/2019... but I will forward to him for his review.. I have asked him to keep a record of her BP readings with the times on it and to take with them to the appt this week and to bring with her to her appt here next month. He agreed and appreciated my call.

## 2019-09-10 ENCOUNTER — Telehealth: Payer: Self-pay

## 2019-09-10 NOTE — Telephone Encounter (Addendum)
Recieved refill request from pharmacy for Carbidopa Levodopa  2 tabs at 7, 2 tabs at 11am and 1 tab at 4pm. This is different then what is in the patients chart. Contacted patients spouse to seek clarification. He states the patient is not taking the medication right now because it is causing hallucinations.   He states when the patient was taking this medication she was taking 2 tabs at 7, 2 at 11am and one at 4pm.   He stated the patient has an appt at the end of the month and this issue will be addressed at that time.   Helene Kelp also states the patients blood pressure has been running high. She states the patient has an appt tomorrow with her pcp. I advised her to address that issue with the pcp tomorrow.  Daughter voiced understanding.   No refill was sent to the pharmacy.

## 2019-09-11 DIAGNOSIS — Z23 Encounter for immunization: Secondary | ICD-10-CM | POA: Diagnosis not present

## 2019-09-12 ENCOUNTER — Other Ambulatory Visit: Payer: Self-pay | Admitting: Neurology

## 2019-09-12 MED ORDER — CARBIDOPA-LEVODOPA ER 25-100 MG PO TBCR: EXTENDED_RELEASE_TABLET | ORAL | 1 refills | Status: DC

## 2019-09-12 NOTE — Telephone Encounter (Signed)
Patient's husband called in stating he "messed up" the patient's medication and needs a prescription for the patient's Sinemet CR sent to the [facility]. In [place]. The patient has not had the medication in a week.

## 2019-09-12 NOTE — Telephone Encounter (Signed)
Spoke with patients daughter,Teresa, verbal consent given by patient. Helene Kelp stated the patient ran out of medication last week and has not had any carbidopa levodopa since then.   I advised her to remind the pt that when she has a week left of medication she needs to contact her pharmacy to request a refill so she does not run out of medication.  Daughter voiced understanding.   Reviewed dose of medication and instructions. Rx(s) sent to pharmacy electronically.

## 2019-09-13 DIAGNOSIS — Z681 Body mass index (BMI) 19 or less, adult: Secondary | ICD-10-CM | POA: Diagnosis not present

## 2019-09-13 DIAGNOSIS — Z950 Presence of cardiac pacemaker: Secondary | ICD-10-CM | POA: Diagnosis not present

## 2019-09-13 DIAGNOSIS — G20 Parkinson's disease: Secondary | ICD-10-CM | POA: Diagnosis not present

## 2019-09-13 DIAGNOSIS — I480 Paroxysmal atrial fibrillation: Secondary | ICD-10-CM | POA: Diagnosis not present

## 2019-09-13 DIAGNOSIS — I10 Essential (primary) hypertension: Secondary | ICD-10-CM | POA: Diagnosis not present

## 2019-09-20 NOTE — Progress Notes (Signed)
Assessment/Plan:    1.  Parkinsons Disease             -Discussed with patient's husband that she really needs to to be taking her carbidopa/levodopa 25/100 at 7 AM/11 AM/4 PM.  -discussed needs to use walker at all times.  -declines PT  2.  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             -She is still having hallucinations, but are better than previous.  Any of the antipsychotics, with the exception of Abilify are going to lengthen the QT interval.  We talked about Abilify.  We discussed that we could certainly use this, but that it does increase risk of making parkinsonian symptoms worse.    She really does not want to do this and her husband agrees that we should hold for now.  3.  REM behavior disorder             -This is commonly associated with PD and the patient is experiencing this.  We discussed that this can be very serious and even harmful.  We talked about medications as well as physical barriers to put in the bed (particularly soft bed rails, pillow barriers).  We talked about moving the night stand so that it is not so close to the side of the bed.  4.  Orthostatic hypotension             -On fludrocortisone.  Blood pressure was very high last visit and wanted them to follow-up with prescribing physician.  I do not see that they did that, but she was started on lisinopril by PA at PCP.  Will send message to cardiologist, Dr. Sallyanne Kuster.  5.  PAF             -Follows with cardiology.  On amiodarone  Subjective:   Abigail Taylor was seen today in follow up for Parkinsons disease.  My previous records were reviewed prior to todays visit as well as outside records available to me.  This patient is accompanied in the office by her spouse who supplements the history. Last visit, we discussed the importance of how to take her levodopa.  We also asked her to bring her husband to the next appointment, as it was  very unclear on how she was taking her medication.  We got a pharmacy request for levodopa in August, and called to confirm how patient was taking the medication.  Husband stated that she was not taking it at all because of hallucinations. Today, however, he states that she takes no levodopa in the AM but will take one at noon, 1 at 4pm and then 1 at bed.  He states that they start it in the late afternoon because she doesn't eat until late afternoon.  It doesn't cause n/v.  She doesn't have visual hallucinations anymore but does think that "they" put toys under a chest of drawers but "they" are gone the next day.    Last visit, the patient's blood pressure was actually very high in the office and we asked her to follow with her prescribing physician from cardiology because she was on Florinef.  When we talked to them on the phone in August, they also mentioned to me that her blood pressure was high.  Her husband states that she was started on lisinopril by PA at  PCP. Several falls.  Doesn't have walker with falls.  Didn't get hurt  Current prescribed movement disorder medications:  Carbidopa/levodopa 25/100 CR, 1 tablets at 7 AM, 1 tablets at 11 AM, 1 tablet at 4 PM/bed (pt actually taking at noon/4pm/bed) Florinef 0.1 mg daily.   ALLERGIES:  No Known Allergies  CURRENT MEDICATIONS:  Outpatient Encounter Medications as of 09/24/2019  Medication Sig  . acetaminophen (TYLENOL) 500 MG tablet Take 1,000 mg by mouth every 6 (six) hours as needed for mild pain.  Marland Kitchen amiodarone (PACERONE) 200 MG tablet TAKE 1 TABLET (200 MG TOTAL) BY MOUTH EVERY OTHER DAY.  . Carbidopa-Levodopa ER (SINEMET CR) 25-100 MG tablet controlled release TAKE 1 TABLETS BY MOUTH AT 7 AM,1 at 11am/1 at 4pm/1 at bedtime (Patient taking differently: TAKE 1 tab at noon, 1 at 4pm, 1 at bedtime)  . doxycycline (VIBRA-TABS) 100 MG tablet Take 100 mg by mouth 2 (two) times daily.  Marland Kitchen ELIQUIS 2.5 MG TABS tablet TAKE 1 TABLET BY MOUTH TWICE A  DAY  . fludrocortisone (FLORINEF) 0.1 MG tablet TAKE 1 TABLET BY MOUTH EVERY DAY  . levothyroxine (SYNTHROID, LEVOTHROID) 75 MCG tablet Take 75 mcg by mouth daily before breakfast.  . lisinopril (ZESTRIL) 5 MG tablet Take 5 mg by mouth daily.  . rosuvastatin (CRESTOR) 10 MG tablet Take 10 mg by mouth at bedtime. Reported on 07/09/2015  . XIIDRA 5 % SOLN Place 1 drop into the left eye 2 (two) times daily.   . [DISCONTINUED] LINZESS 145 MCG CAPS capsule Take 145 mcg by mouth daily. (Patient not taking: Reported on 09/24/2019)   No facility-administered encounter medications on file as of 09/24/2019.    Objective:   PHYSICAL EXAMINATION:    VITALS:   Vitals:   09/24/19 1431  BP: 120/68  Pulse: 69  SpO2: 98%  Weight: 119 lb (54 kg)  Height: 5\' 6"  (1.676 m)    GEN:  The patient appears stated age and is in NAD. HEENT:  Normocephalic, atraumatic.  The mucous membranes are moist. The superficial temporal arteries are without ropiness or tenderness. CV:  RRR Lungs:  CTAB Neck/HEME:  There are no carotid bruits bilaterally.  Neurological examination:  Orientation: The patient is alert and oriented x3. Cranial nerves: There is good facial symmetry with facial hypomimia. The speech is fluent and clear with hyphonic speech. Soft palate rises symmetrically and there is no tongue deviation. Hearing is intact to conversational tone. Sensation: Sensation is intact to light touch throughout Motor: Strength is at least antigravity x4.  Movement examination: Tone: There is mild increased tone in the bilateral ue Abnormal movements: there is mild intermittent rue rest tremor Coordination:  There is mild decremation with RAM's, with any form of RAMS, including alternating supination and pronation of the forearm, hand opening and closing, finger taps, heel taps and toe taps, R>L Gait and Station: The patient has mild difficulty arising out of a deep-seated chair without the use of the hands. The  patient's stride length is good with the walker.    I have reviewed and interpreted the following labs independently    Chemistry      Component Value Date/Time   NA 141 10/07/2018 0642   K 4.0 10/07/2018 0642   CL 110 10/07/2018 0642   CO2 26 10/07/2018 0642   BUN 15 10/07/2018 0642   CREATININE 0.68 10/07/2018 0642   CREATININE 0.74 04/30/2015 0853      Component Value Date/Time   CALCIUM 8.1 (L) 10/07/2018  [place] 80 11/04/2017 1952   AST 17 11/04/2017 1952   ALT 13 11/04/2017 1952   BILITOT 0.8 11/04/2017 1952       Lab Results  Component Value Date   WBC 8.0 10/07/2018   HGB 13.3 10/07/2018   HCT 41.0 10/07/2018   MCV 88.4 10/07/2018   PLT 232 10/07/2018    Lab Results  Component Value Date   TSH 2.886 10/06/2018     Total time spent on today's visit was 30 minutes, including both face-to-face time and nonface-to-face time.  Time included that spent on review of records (prior notes available to me/labs/imaging if pertinent), discussing treatment and goals, answering patient's questions and coordinating care.  Cc:  [name], MD

## 2019-09-24 ENCOUNTER — Encounter: Payer: Self-pay | Admitting: Neurology

## 2019-09-24 ENCOUNTER — Other Ambulatory Visit: Payer: Self-pay

## 2019-09-24 ENCOUNTER — Ambulatory Visit (INDEPENDENT_AMBULATORY_CARE_PROVIDER_SITE_OTHER): Payer: Medicare Other | Admitting: Neurology

## 2019-09-24 VITALS — BP 120/68 | HR 69 | Ht 66.0 in | Wt 119.0 lb

## 2019-09-24 DIAGNOSIS — M48061 Spinal stenosis, lumbar region without neurogenic claudication: Secondary | ICD-10-CM | POA: Diagnosis not present

## 2019-09-24 DIAGNOSIS — R441 Visual hallucinations: Secondary | ICD-10-CM

## 2019-09-24 DIAGNOSIS — G903 Multi-system degeneration of the autonomic nervous system: Secondary | ICD-10-CM

## 2019-09-24 DIAGNOSIS — G20 Parkinson's disease: Secondary | ICD-10-CM | POA: Diagnosis not present

## 2019-09-24 DIAGNOSIS — I951 Orthostatic hypotension: Secondary | ICD-10-CM | POA: Diagnosis not present

## 2019-09-24 DIAGNOSIS — M810 Age-related osteoporosis without current pathological fracture: Secondary | ICD-10-CM | POA: Diagnosis not present

## 2019-09-24 DIAGNOSIS — E063 Autoimmune thyroiditis: Secondary | ICD-10-CM | POA: Diagnosis not present

## 2019-09-24 MED ORDER — CARBIDOPA-LEVODOPA ER 25-100 MG PO TBCR
EXTENDED_RELEASE_TABLET | ORAL | 1 refills | Status: DC
Start: 2019-09-24 — End: 2020-03-24

## 2019-09-24 NOTE — Patient Instructions (Addendum)
1.  Take carbidopa/levodopa 25/100 CR at 8am/noon/4pm.  You don't have to take it with food. 2.  Use the walker at all times 3.  Make an appt with cardiology to discuss your florinef  The physicians and staff at [facility] are committed to providing excellent care. You may receive a survey requesting feedback about your experience at our office. We strive to receive "very good" responses to the survey questions. If you feel that your experience would prevent you from giving the office a "very good " response, please contact our office to try to remedy the situation. We may be reached at [phone]. Thank you for taking the time out of your busy day to complete the survey.

## 2019-10-01 DIAGNOSIS — H04562 Stenosis of left lacrimal punctum: Secondary | ICD-10-CM | POA: Diagnosis not present

## 2019-10-01 DIAGNOSIS — H02112 Cicatricial ectropion of right lower eyelid: Secondary | ICD-10-CM | POA: Diagnosis not present

## 2019-10-01 DIAGNOSIS — H02142 Spastic ectropion of right lower eyelid: Secondary | ICD-10-CM | POA: Diagnosis not present

## 2019-10-01 DIAGNOSIS — H02132 Senile ectropion of right lower eyelid: Secondary | ICD-10-CM | POA: Diagnosis not present

## 2019-10-01 DIAGNOSIS — H02135 Senile ectropion of left lower eyelid: Secondary | ICD-10-CM | POA: Diagnosis not present

## 2019-10-01 DIAGNOSIS — H16213 Exposure keratoconjunctivitis, bilateral: Secondary | ICD-10-CM | POA: Diagnosis not present

## 2019-10-01 DIAGNOSIS — H0015 Chalazion left lower eyelid: Secondary | ICD-10-CM | POA: Diagnosis not present

## 2019-10-01 DIAGNOSIS — H02145 Spastic ectropion of left lower eyelid: Secondary | ICD-10-CM | POA: Diagnosis not present

## 2019-10-01 DIAGNOSIS — H02115 Cicatricial ectropion of left lower eyelid: Secondary | ICD-10-CM | POA: Diagnosis not present

## 2019-10-01 DIAGNOSIS — H02532 Eyelid retraction right lower eyelid: Secondary | ICD-10-CM | POA: Diagnosis not present

## 2019-10-01 DIAGNOSIS — H11822 Conjunctivochalasis, left eye: Secondary | ICD-10-CM | POA: Diagnosis not present

## 2019-10-01 DIAGNOSIS — H04123 Dry eye syndrome of bilateral lacrimal glands: Secondary | ICD-10-CM | POA: Diagnosis not present

## 2019-10-01 DIAGNOSIS — H02815 Retained foreign body in left lower eyelid: Secondary | ICD-10-CM | POA: Diagnosis not present

## 2019-10-17 ENCOUNTER — Ambulatory Visit (INDEPENDENT_AMBULATORY_CARE_PROVIDER_SITE_OTHER): Payer: Medicare Other | Admitting: Cardiovascular Disease

## 2019-10-17 ENCOUNTER — Encounter: Payer: Self-pay | Admitting: Cardiovascular Disease

## 2019-10-17 ENCOUNTER — Other Ambulatory Visit: Payer: Self-pay

## 2019-10-17 VITALS — BP 162/84 | HR 60 | Ht 66.0 in | Wt 118.0 lb

## 2019-10-17 DIAGNOSIS — I495 Sick sinus syndrome: Secondary | ICD-10-CM

## 2019-10-17 DIAGNOSIS — Z79899 Other long term (current) drug therapy: Secondary | ICD-10-CM | POA: Diagnosis not present

## 2019-10-17 DIAGNOSIS — Z7901 Long term (current) use of anticoagulants: Secondary | ICD-10-CM | POA: Diagnosis not present

## 2019-10-17 DIAGNOSIS — Z950 Presence of cardiac pacemaker: Secondary | ICD-10-CM | POA: Diagnosis not present

## 2019-10-17 DIAGNOSIS — I480 Paroxysmal atrial fibrillation: Secondary | ICD-10-CM | POA: Diagnosis not present

## 2019-10-17 DIAGNOSIS — Z5181 Encounter for therapeutic drug level monitoring: Secondary | ICD-10-CM

## 2019-10-17 DIAGNOSIS — G903 Multi-system degeneration of the autonomic nervous system: Secondary | ICD-10-CM | POA: Diagnosis not present

## 2019-10-17 DIAGNOSIS — E7800 Pure hypercholesterolemia, unspecified: Secondary | ICD-10-CM

## 2019-10-17 DIAGNOSIS — E441 Mild protein-calorie malnutrition: Secondary | ICD-10-CM | POA: Diagnosis not present

## 2019-10-17 MED ORDER — FLUDROCORTISONE ACETATE 0.1 MG PO TABS: 100.0000 ug | ORAL_TABLET | ORAL | 3 refills | Status: DC

## 2019-10-17 NOTE — Patient Instructions (Addendum)
Medication Instructions:  STOP the Lisinopril REDUCE the Fludrocortisone (Florinef) to 4 times weekly, Tuesday, Thursday, Saturday and Sunday  *If you need a refill on your cardiac medications before your next appointment, please call your pharmacy*   Lab Work: None ordered If you have labs (blood work) drawn today and your tests are completely normal, you will receive your results only by:  [place] (if you have MyChart) OR  A paper copy in the mail If you have any lab test that is abnormal or we need to change your treatment, we will call you to review the results.   Testing/Procedures: None ordered   Follow-Up: At [facility], you and your health needs are our priority.  As part of our continuing mission to provide you with exceptional heart care, we have created designated Provider Care Teams.  These Care Teams include your primary Cardiologist (physician) and Advanced Practice Providers (APPs -  Physician Assistants and Nurse Practitioners) who all work together to provide you with the care you need, when you need it.  We recommend signing up for the patient portal called "MyChart".  Sign up information is provided on this After Visit Summary.  MyChart is used to connect with patients for Virtual Visits (Telemedicine).  Patients are able to view lab/test results, encounter notes, upcoming appointments, etc.  Non-urgent messages can be sent to your provider as well.   To learn more about what you can do with MyChart, go to [email or web address].    Your next appointment:   12 month(s)  The format for your next appointment:   In Person  Provider:   [name], MD

## 2019-10-17 NOTE — Progress Notes (Signed)
Patient ID: Abigail Taylor, female   DOB: [birth year], 86 y.o.   MRN: [id number]    Cardiology Office Note    Date:  10/17/2019   ID:  Abigail Taylor, DOB 27-Jun-1931, MRN [id number]  PCP:  [name], MD  Cardiologist:   [name], MD   Chief Complaint  Patient presents with   Pacemaker Check   Atrial Fibrillation    History of Present Illness:  Abigail Taylor is a 86 y.o. female with a history of sinus node dysfunction, recurrent paroxysmal and persistent atrial fibrillation with rapid ventricular response, tachycardia-bradycardia syndrome, status post implantation of a dual-chamber permanent pacemaker (Medtronic Adapta), Orthostatic hypotension related to Parkinson's syndrome/Autonomic dysfunction.    She was recently found to be hypertensive and was started on lisinopril.  This is despite the fact that she takes fludrocortisone for symptomatic orthostatic hypotension.  She has not had edema or heart failure symptoms.  She has not had recent problems with dizziness, lightheadedness or syncope.  She also denies angina, orthopnea, PND, claudication or new focal neurological complaints.  Her activity remains limited by Parkinson's disease and she is quite sedentary.  She has had a couple of falls due to her gait difficulties, but not associated with syncope or serious injury.  Last year she had a fall while bending over and suffered rest lumbar spine fracture.  She has not had overt bleeding problems.  She is on Eliquis, lower dose adjusted for age and low body size.  She is on chronic treatment with low-dose amiodarone and had normal liver function tests and TSH level in June 2021.  She is on statin and her LDL cholesterol was 68 a year ago when September.  Her pacemaker function is normal. Her Medtronic Adapta dual-chamber device was implanted in 2012 and has roughly 6.5 years of remaining longevity. She is not pacemaker dependent and has roughly 97 % atrial pacing and does not  require ventricular pacing (less than 0.1%).  As always her heart rate histogram is rather blunted, but she is quite sedentary.  The burden of atrial fibrillation remains extremely low, without any lengthy events since her hospitalization with her spine fracture in 2020.  Presenting rhythm today is atrial paced, ventricular sensed.  She had normal coronary arteries by remote angiogram performed in 2001 and a normal nuclear stress test in 2013.    Past Medical History:  Diagnosis Date   Angina at rest, with the tachycardia 07/05/2011   Aortic insufficiency 07/05/2011   Atrial fibrillation (HCC)    Brady-tachy syndrome (HCC)    Dual chamber Medtronic pacemaker Adapta   Chronic back pain    Constipation 12/27/2011   Gait instability    Hyperlipidemia    Hypothyroidism    Normal cardiac stress test 2013   OA (osteoarthritis)    Pacemaker    Parkinson's disease [facility])    Tremor     Past Surgical History:  Procedure Laterality Date   ABDOMINAL HYSTERECTOMY     CARDIAC CATHETERIZATION  05/26/1999   normal   CARDIAC CATHETERIZATION  2001   normal coronary arteries   cataract     COLONOSCOPY N/A 11/15/2012   Procedure: COLONOSCOPY;  Surgeon: [name], MD;  [facility];  Service: Endoscopy;  Laterality: N/A;  1030   ESOPHAGOGASTRODUODENOSCOPY (EGD) WITH PROPOFOL N/A 11/06/2017   Procedure: ESOPHAGOGASTRODUODENOSCOPY (EGD) WITH PROPOFOL;  Surgeon: [name], MD;  Location: [facility];  Service: Endoscopy;  Laterality: N/A;   HIP ARTHROPLASTY  Left 06/24/2016   Procedure: ARTHROPLASTY BIPOLAR HIP (HEMIARTHROPLASTY);  Surgeon: Altamese Woodman, MD;  Location: [facility];  Service: Orthopedics;  Laterality: Left;   INSERT / REPLACE / REMOVE PACEMAKER     NM MYOVIEW LTD  07/19/2011   normal   PERMANENT PACEMAKER INSERTION  07/05/2011   Medtronic Adapta dual chamber   PERMANENT PACEMAKER INSERTION N/A 07/05/2011   Procedure: PERMANENT PACEMAKER INSERTION;   Surgeon: [name], MD;  Location: [facility] CATH [facility];  Service: Cardiovascular;  Laterality: N/A;    Current Medications: Outpatient Medications Prior to Visit  Medication Sig Dispense Refill   acetaminophen (TYLENOL) 500 MG tablet Take 1,000 mg by mouth every 6 (six) hours as needed for mild pain.     amiodarone (PACERONE) 200 MG tablet TAKE 1 TABLET (200 MG TOTAL) BY MOUTH EVERY OTHER DAY. 45 tablet 3   Carbidopa-Levodopa ER (SINEMET CR) 25-100 MG tablet controlled release 1 at 8am/noon/4pm 360 tablet 1   doxycycline (VIBRA-TABS) 100 MG tablet Take 100 mg by mouth 2 (two) times daily.     ELIQUIS 2.5 MG TABS tablet TAKE 1 TABLET BY MOUTH TWICE A DAY 180 tablet 1   levothyroxine (SYNTHROID, LEVOTHROID) 75 MCG tablet Take 75 mcg by mouth daily before breakfast.     rosuvastatin (CRESTOR) 10 MG tablet Take 10 mg by mouth at bedtime. Reported on 07/09/2015     XIIDRA 5 % SOLN Place 1 drop into the left eye 2 (two) times daily.      fludrocortisone (FLORINEF) 0.1 MG tablet TAKE 1 TABLET BY MOUTH EVERY DAY 90 tablet 3   lisinopril (ZESTRIL) 5 MG tablet Take 5 mg by mouth daily.     No facility-administered medications prior to visit.     Allergies:   Patient has no known allergies.   Social History   Socioeconomic History   Marital status: Married    Spouse name: Not on file   Number of children: 3   Years of education: Not on file   Highest education level: Some college, no degree  Occupational History   Occupation: retired    Comment: Air cabin crew Tobacco  Tobacco Use   Smoking status: Never Smoker   Smokeless tobacco: Never Used  Scientific laboratory technician Use: Never used  Substance and Sexual Activity   Alcohol use: No    Alcohol/week: 0.0 standard drinks   Drug use: No   Sexual activity: Not on file  Other Topics Concern   Not on file  Social History Narrative   Not on file   Social Determinants of Health   Financial Resource Strain:     Difficulty of Paying Living Expenses: Not on file  Food Insecurity:    Worried About Charity fundraiser in the Last Year: Not on file   [employer] of Food in the Last Year: Not on file  Transportation Needs:    Lack of Transportation (Medical): Not on file   Lack of Transportation (Non-Medical): Not on file  Physical Activity:    Days of Exercise per Week: Not on file   Minutes of Exercise per Session: Not on file  Stress:    Feeling of Stress : Not on file  Social Connections:    Frequency of Communication with Friends and Family: Not on file   Frequency of Social Gatherings with Friends and Family: Not on file   Attends Religious Services: Not on file   Active Member of Clubs or Organizations: Not on file  Attends Archivist Meetings: Not on file   Marital Status: Not on file     Family History:  The patient's family history includes Cancer in her father; Colon cancer (age of onset: 30) in her mother; Healthy in her brother, daughter, and son; Heart attack in her mother; Kidney cancer in her sister; Suicidality in her son.   ROS:   Please see the history of present illness.    ROS All other systems are reviewed and are negative.   PHYSICAL EXAM:   VS:  BP (!) 162/84    Pulse 60    Ht 5\' 6"  (1.676 m)    Wt 118 lb (53.5 kg)    SpO2 100%    BMI 19.05 kg/m      General: Alert, oriented x3, no distress, very lean, appears frail Head: no evidence of trauma, PERRL, EOMI, no exophtalmos or lid lag, no myxedema, no xanthelasma; normal ears, nose and oropharynx Neck: normal jugular venous pulsations and no hepatojugular reflux; brisk carotid pulses without delay and no carotid bruits Chest: clear to auscultation, no signs of consolidation by percussion or palpation, normal fremitus, symmetrical and full respiratory excursions Cardiovascular: normal position and quality of the apical impulse, regular rhythm, normal first and widely split second heart sounds, no  murmurs, rubs or gallops Abdomen: no tenderness or distention, no masses by palpation, no abnormal pulsatility or arterial bruits, normal bowel sounds, no hepatosplenomegaly Extremities: no clubbing, cyanosis or edema; 2+ radial, ulnar and brachial pulses bilaterally; 2+ right femoral, posterior tibial and dorsalis pedis pulses; 2+ left femoral, posterior tibial and dorsalis pedis pulses; no subclavian or femoral bruits Neurological: grossly nonfocal, but with very slow limited motion and changes in facial expression Psych: Normal mood and affect   Wt Readings from Last 3 Encounters:  10/17/19 118 lb (53.5 kg)  09/24/19 119 lb (54 kg)  08/30/19 130 lb (59 kg)      Studies/Labs Reviewed:   EKG:  EKG is ordered today.  It shows atrial paced, ventricular sensed rhythm with long AV delay and old right bundle branch block.  QTc is 522 ms.  Recent Labs: No results found for requested labs within last 8760 hours.  07/16/2019 Creatinine 0.98, normal liver function tests, TSH 1.7, hemoglobin 15.1 Lipid Panel    Component Value Date/Time   CHOL 133 04/30/2015 0853   TRIG 84 04/30/2015 0853   HDL 54 04/30/2015 0853   CHOLHDL 2.5 04/30/2015 0853   VLDL 17 04/30/2015 0853   LDLCALC 62 04/30/2015 0853  07/16/2019 Total cholesterol 131, HDL 58, triglycerides 86  ASSESSMENT:    1. Paroxysmal atrial fibrillation (HCC)   2. Encounter for monitoring amiodarone therapy   3. Long term current use of anticoagulant   4. SSS (sick sinus syndrome) ([place])   5. Pacemaker   6. Neurogenic orthostatic hypotension (HCC)   7. Hypercholesterolemia   8. Mild protein-calorie malnutrition ([place])      PLAN:  In order of problems listed above:  1. Afib: The arrhythmia is exceptionally well controlled on a very low dose of amiodarone.  In the past, when amiodarone was stopped completely she developed atrial fibrillation with rapid ventricular response and rate control was difficult since orthostatic  hypotension precluded the use of beta-blockers or calcium channel blockers.  I would recommend lifelong treatment with amiodarone barring serious side effects.  She is appropriately anticoagulated.  She has not had known embolic events.. 2. Amiodarone: Had some GI side effects at higher doses, but  none currently.  Normal liver and thyroid function tests in June. 3. Eliquis CHADSVasc 3 (age, gender).  Although she has had falls, she has not had serious bleeding problems or head injuries. 4. SSS: Virtually 100% atrial paced rhythm.  Heart rate histogram, although blunted, appears appropriate for her very sedentary lifestyle. 5. PM: Normal device function, continue remote downloads every 3 months. 6. Orthostatic hypotension: Avoid dehydration and avoid diuretics.  Rather than adding lisinopril to her fludrocortisone, I think we should decrease the dose of fludrocortisone.  She will take it only 0.1 mg 4 days a week (Tuesday Thursday Saturday and Sunday) and she will skip the other 3 days of the week.  Discontinue lisinopril.  We may have to tweak the dose of fludrocortisone further, but I explained that it takes a long time with changes in the dose of this medication to take effect.  She will send [place] blood pressure readings from home in about 3 weeks. 7. HLP: Excellent recent lipid profile. 8. Malnutrition: She is underweight, which worsens with tendency to have orthostatic hypotension.    Medication Adjustments/Labs and Tests Ordered: Current medicines are reviewed at length with the patient today.  Concerns regarding medicines are outlined above.  Medication changes, Labs and Tests ordered today are listed in the Patient Instructions below. Patient Instructions  Medication Instructions:  STOP the Lisinopril REDUCE the Fludrocortisone (Florinef) to 4 times weekly, Tuesday, Thursday, Saturday and Sunday  *If you need a refill on your cardiac medications before your next appointment, please call your  pharmacy*   Lab Work: None ordered If you have labs (blood work) drawn today and your tests are completely normal, you will receive your results only by:  [facility] (if you have MyChart) OR  A paper copy in the mail If you have any lab test that is abnormal or we need to change your treatment, we will call you to review the results.   Testing/Procedures: None ordered   Follow-Up: At [facility], you and your health needs are our priority.  As part of our continuing mission to provide you with exceptional heart care, we have created designated Provider Care Teams.  These Care Teams include your primary Cardiologist (physician) and Advanced Practice Providers (APPs -  Physician Assistants and Nurse Practitioners) who all work together to provide you with the care you need, when you need it.  We recommend signing up for the patient portal called "MyChart".  Sign up information is provided on this After Visit Summary.  MyChart is used to connect with patients for Virtual Visits (Telemedicine).  Patients are able to view lab/test results, encounter notes, upcoming appointments, etc.  Non-urgent messages can be sent to your provider as well.   To learn more about what you can do with MyChart, go to [email or web address].    Your next appointment:   12 month(s)  The format for your next appointment:   In Person  Provider:   [name], MD       Signed, [name], MD  10/17/2019 8:35 PM    [facility] [address], [place], [place]  [address] Phone: [phone]; Fax: [phone]

## 2019-10-24 DIAGNOSIS — I739 Peripheral vascular disease, unspecified: Secondary | ICD-10-CM | POA: Diagnosis not present

## 2019-10-24 DIAGNOSIS — M810 Age-related osteoporosis without current pathological fracture: Secondary | ICD-10-CM | POA: Diagnosis not present

## 2019-10-24 DIAGNOSIS — M48061 Spinal stenosis, lumbar region without neurogenic claudication: Secondary | ICD-10-CM | POA: Diagnosis not present

## 2019-10-24 DIAGNOSIS — I951 Orthostatic hypotension: Secondary | ICD-10-CM | POA: Diagnosis not present

## 2019-10-24 DIAGNOSIS — E063 Autoimmune thyroiditis: Secondary | ICD-10-CM | POA: Diagnosis not present

## 2019-10-28 DIAGNOSIS — C4449 Other specified malignant neoplasm of skin of scalp and neck: Secondary | ICD-10-CM | POA: Diagnosis not present

## 2019-10-28 DIAGNOSIS — L820 Inflamed seborrheic keratosis: Secondary | ICD-10-CM | POA: Diagnosis not present

## 2019-10-28 DIAGNOSIS — L570 Actinic keratosis: Secondary | ICD-10-CM | POA: Diagnosis not present

## 2019-10-28 DIAGNOSIS — C4442 Squamous cell carcinoma of skin of scalp and neck: Secondary | ICD-10-CM | POA: Diagnosis not present

## 2019-10-28 DIAGNOSIS — X32XXXD Exposure to sunlight, subsequent encounter: Secondary | ICD-10-CM | POA: Diagnosis not present

## 2019-11-08 DIAGNOSIS — H04123 Dry eye syndrome of bilateral lacrimal glands: Secondary | ICD-10-CM | POA: Diagnosis not present

## 2019-11-14 ENCOUNTER — Other Ambulatory Visit: Payer: Self-pay | Admitting: Cardiovascular Disease

## 2019-11-18 DIAGNOSIS — C4449 Other specified malignant neoplasm of skin of scalp and neck: Secondary | ICD-10-CM | POA: Diagnosis not present

## 2019-11-21 DIAGNOSIS — R634 Abnormal weight loss: Secondary | ICD-10-CM | POA: Diagnosis not present

## 2019-11-21 DIAGNOSIS — Z0000 Encounter for general adult medical examination without abnormal findings: Secondary | ICD-10-CM | POA: Diagnosis not present

## 2019-11-21 DIAGNOSIS — I10 Essential (primary) hypertension: Secondary | ICD-10-CM | POA: Diagnosis not present

## 2019-11-21 DIAGNOSIS — Z1389 Encounter for screening for other disorder: Secondary | ICD-10-CM | POA: Diagnosis not present

## 2019-11-21 DIAGNOSIS — E063 Autoimmune thyroiditis: Secondary | ICD-10-CM | POA: Diagnosis not present

## 2019-11-21 DIAGNOSIS — Z681 Body mass index (BMI) 19 or less, adult: Secondary | ICD-10-CM | POA: Diagnosis not present

## 2019-11-21 DIAGNOSIS — K219 Gastro-esophageal reflux disease without esophagitis: Secondary | ICD-10-CM | POA: Diagnosis not present

## 2019-11-21 DIAGNOSIS — N390 Urinary tract infection, site not specified: Secondary | ICD-10-CM | POA: Diagnosis not present

## 2019-11-21 DIAGNOSIS — Z0001 Encounter for general adult medical examination with abnormal findings: Secondary | ICD-10-CM | POA: Diagnosis not present

## 2019-11-21 DIAGNOSIS — Z23 Encounter for immunization: Secondary | ICD-10-CM | POA: Diagnosis not present

## 2019-11-21 DIAGNOSIS — R55 Syncope and collapse: Secondary | ICD-10-CM | POA: Diagnosis not present

## 2019-11-23 DIAGNOSIS — E063 Autoimmune thyroiditis: Secondary | ICD-10-CM | POA: Diagnosis not present

## 2019-11-23 DIAGNOSIS — I951 Orthostatic hypotension: Secondary | ICD-10-CM | POA: Diagnosis not present

## 2019-11-23 DIAGNOSIS — M810 Age-related osteoporosis without current pathological fracture: Secondary | ICD-10-CM | POA: Diagnosis not present

## 2019-11-23 DIAGNOSIS — M48061 Spinal stenosis, lumbar region without neurogenic claudication: Secondary | ICD-10-CM | POA: Diagnosis not present

## 2019-12-02 DIAGNOSIS — C4449 Other specified malignant neoplasm of skin of scalp and neck: Secondary | ICD-10-CM | POA: Diagnosis not present

## 2019-12-08 DIAGNOSIS — W19XXXA Unspecified fall, initial encounter: Secondary | ICD-10-CM | POA: Diagnosis not present

## 2019-12-08 DIAGNOSIS — Z043 Encounter for examination and observation following other accident: Secondary | ICD-10-CM | POA: Diagnosis not present

## 2019-12-08 DIAGNOSIS — M549 Dorsalgia, unspecified: Secondary | ICD-10-CM | POA: Diagnosis not present

## 2019-12-08 DIAGNOSIS — M533 Sacrococcygeal disorders, not elsewhere classified: Secondary | ICD-10-CM | POA: Diagnosis not present

## 2019-12-24 DIAGNOSIS — E063 Autoimmune thyroiditis: Secondary | ICD-10-CM | POA: Diagnosis not present

## 2019-12-24 DIAGNOSIS — H04123 Dry eye syndrome of bilateral lacrimal glands: Secondary | ICD-10-CM | POA: Diagnosis not present

## 2019-12-24 DIAGNOSIS — I951 Orthostatic hypotension: Secondary | ICD-10-CM | POA: Diagnosis not present

## 2019-12-24 DIAGNOSIS — M810 Age-related osteoporosis without current pathological fracture: Secondary | ICD-10-CM | POA: Diagnosis not present

## 2019-12-24 DIAGNOSIS — H35372 Puckering of macula, left eye: Secondary | ICD-10-CM | POA: Diagnosis not present

## 2019-12-24 DIAGNOSIS — M48061 Spinal stenosis, lumbar region without neurogenic claudication: Secondary | ICD-10-CM | POA: Diagnosis not present

## 2020-01-09 DIAGNOSIS — I739 Peripheral vascular disease, unspecified: Secondary | ICD-10-CM | POA: Diagnosis not present

## 2020-01-16 ENCOUNTER — Ambulatory Visit (INDEPENDENT_AMBULATORY_CARE_PROVIDER_SITE_OTHER): Payer: Medicare Other

## 2020-01-16 DIAGNOSIS — I4891 Unspecified atrial fibrillation: Secondary | ICD-10-CM | POA: Diagnosis not present

## 2020-01-17 LAB — CUP PACEART REMOTE DEVICE CHECK
Battery Impedance: 856 Ohm
Battery Remaining Longevity: 77 mo
Battery Voltage: 2.78 V
Brady Statistic AP VP Percent: 0 %
Brady Statistic AP VS Percent: 84 %
Brady Statistic AS VP Percent: 0 %
Brady Statistic AS VS Percent: 16 %
Date Time Interrogation Session: 20211223122818
Implantable Lead Implant Date: 20130611
Implantable Lead Implant Date: 20130611
Implantable Lead Location: 753859
Implantable Lead Location: 753860
Implantable Lead Model: 5076
Implantable Lead Model: 5076
Implantable Pulse Generator Implant Date: 20130611
Lead Channel Impedance Value: 419 Ohm
Lead Channel Impedance Value: 623 Ohm
Lead Channel Pacing Threshold Amplitude: 0.625 V
Lead Channel Pacing Threshold Amplitude: 0.875 V
Lead Channel Pacing Threshold Pulse Width: 0.4 ms
Lead Channel Pacing Threshold Pulse Width: 0.4 ms
Lead Channel Setting Pacing Amplitude: 1.5 V
Lead Channel Setting Pacing Amplitude: 2 V
Lead Channel Setting Pacing Pulse Width: 0.4 ms
Lead Channel Setting Sensing Sensitivity: 5.6 mV

## 2020-01-24 DIAGNOSIS — E063 Autoimmune thyroiditis: Secondary | ICD-10-CM | POA: Diagnosis not present

## 2020-01-24 DIAGNOSIS — I951 Orthostatic hypotension: Secondary | ICD-10-CM | POA: Diagnosis not present

## 2020-01-24 DIAGNOSIS — M48061 Spinal stenosis, lumbar region without neurogenic claudication: Secondary | ICD-10-CM | POA: Diagnosis not present

## 2020-01-24 DIAGNOSIS — M810 Age-related osteoporosis without current pathological fracture: Secondary | ICD-10-CM | POA: Diagnosis not present

## 2020-01-29 NOTE — Progress Notes (Signed)
Remote pacemaker transmission.   

## 2020-02-01 IMAGING — CT CT ABD-PELV W/ CM
2 of 4 series · 16 of 46 positions shown, 18 images · IV contrast (Isovue)
Comparison: No priors.

CLINICAL DATA: 86-year-old female with history of constipation.
Black stools.

EXAM:
CT ABDOMEN AND PELVIS WITH CONTRAST
TECHNIQUE: Multidetector CT imaging of the abdomen and pelvis was performed
using the standard protocol following bolus administration of
intravenous contrast.
CONTRAST:  100mL OMNIPAQUE IOHEXOL 300 MG/ML  SOLN

[Series 2: axial st · axial · 0.76mm/px · z∈[+880,+1270]mm · 13 of 86 slices shown, 15 images]
[im 4/86  soft-tissue]
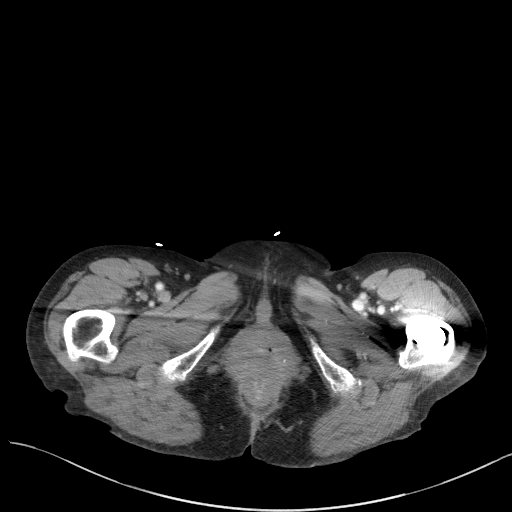
[im 4/86  bone]
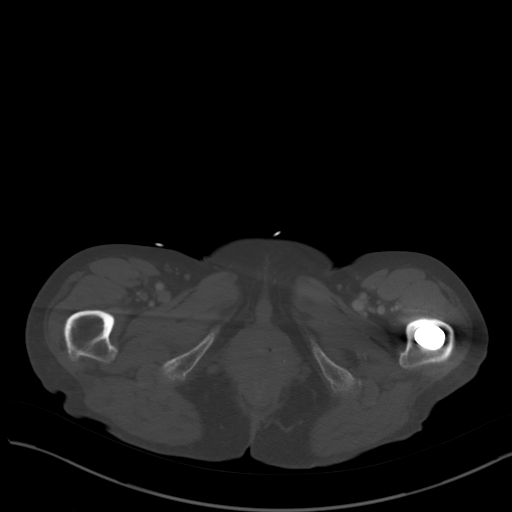
[im 12/86  soft-tissue]
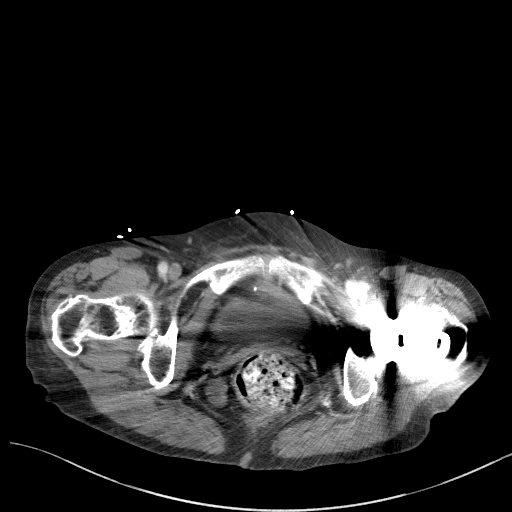
[im 20/86  soft-tissue]
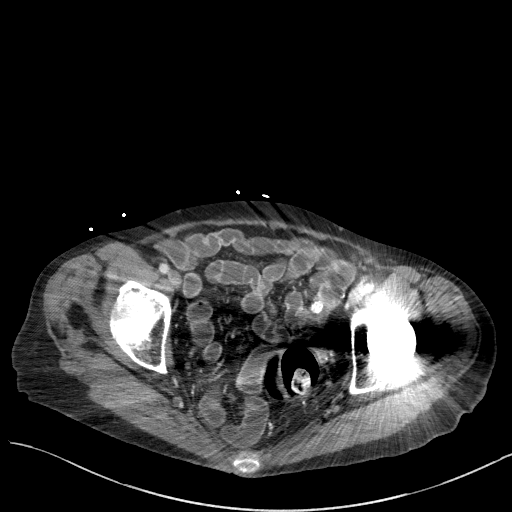
[im 24/86  soft-tissue]
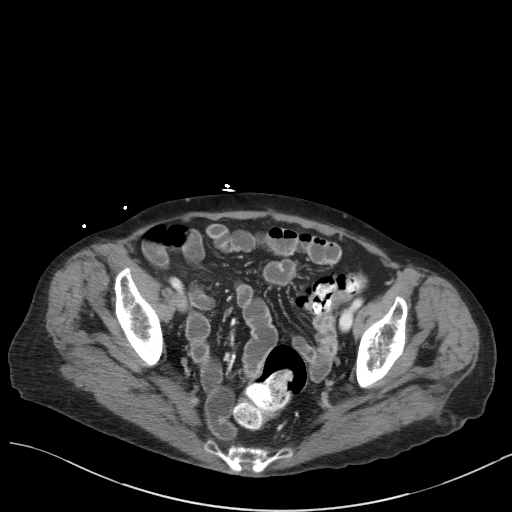
[im 31/86  soft-tissue]
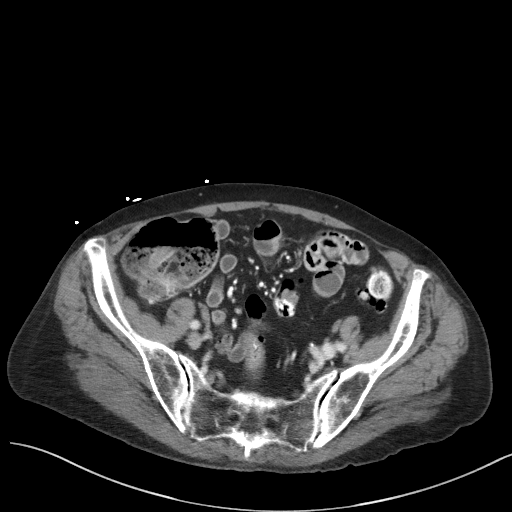
[im 35/86  soft-tissue]
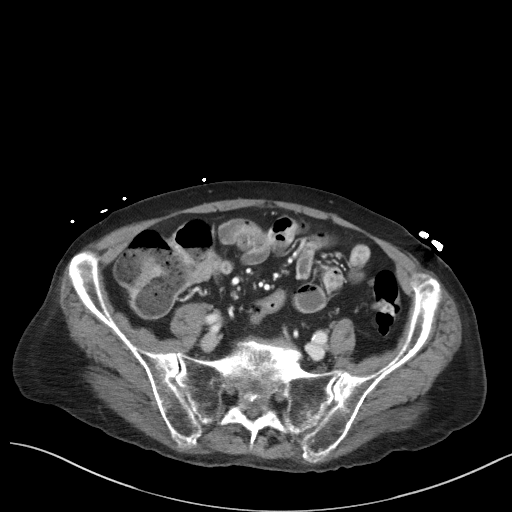
[im 43/86  soft-tissue]
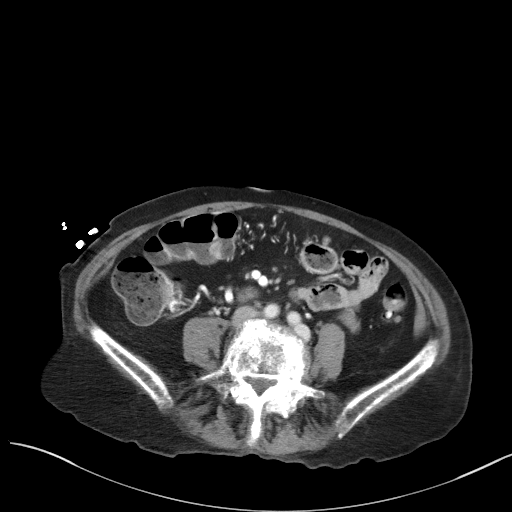
[im 51/86  soft-tissue]
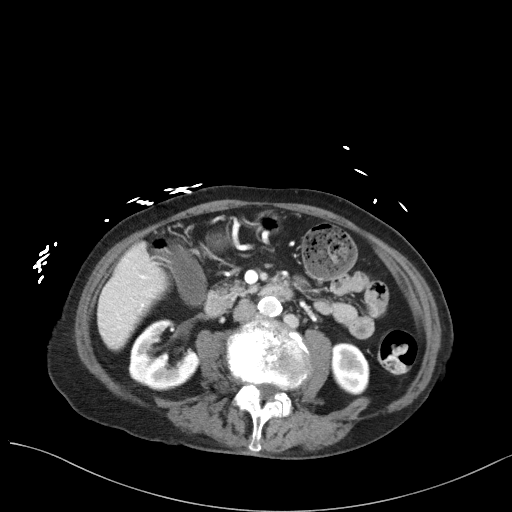
[im 55/86  soft-tissue]
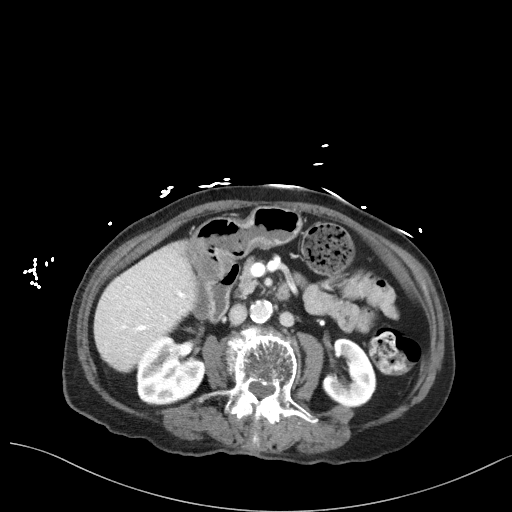
[im 55/86  bone]
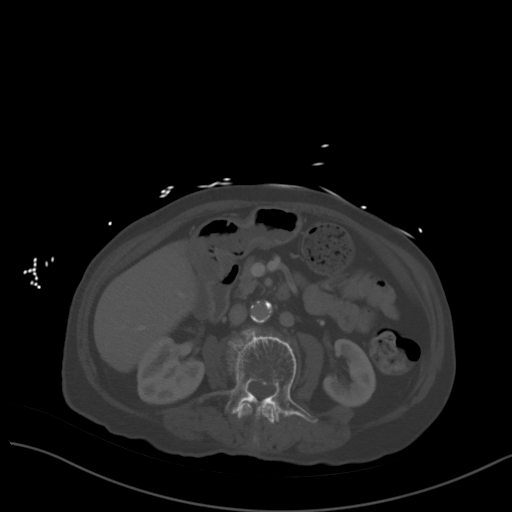
[im 62/86  soft-tissue]
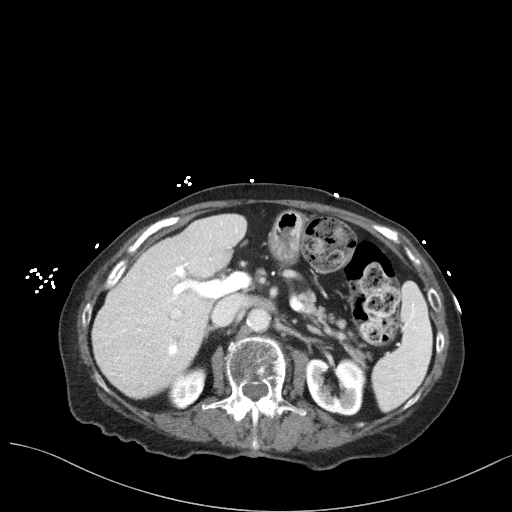
[im 66/86  soft-tissue]
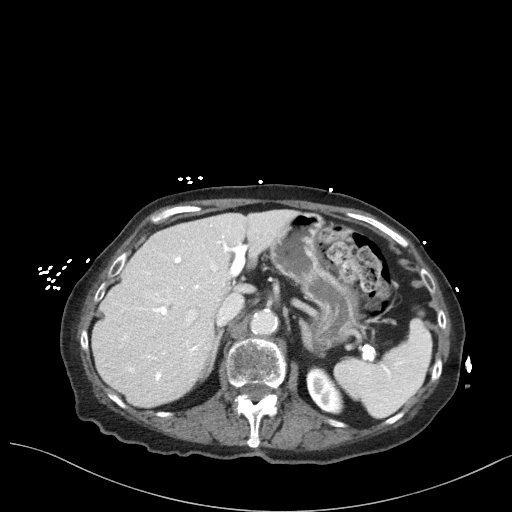
[im 74/86  soft-tissue]
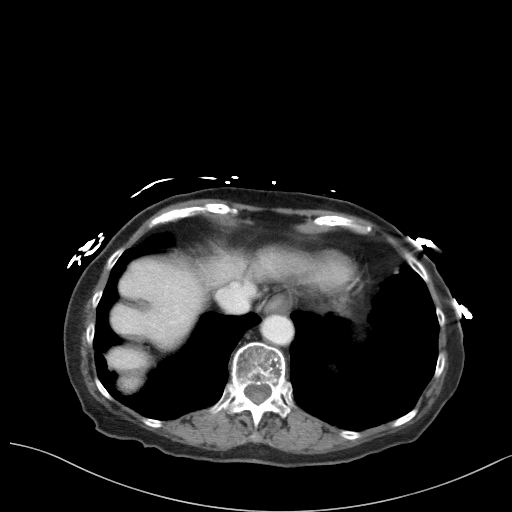
[im 82/86  soft-tissue]
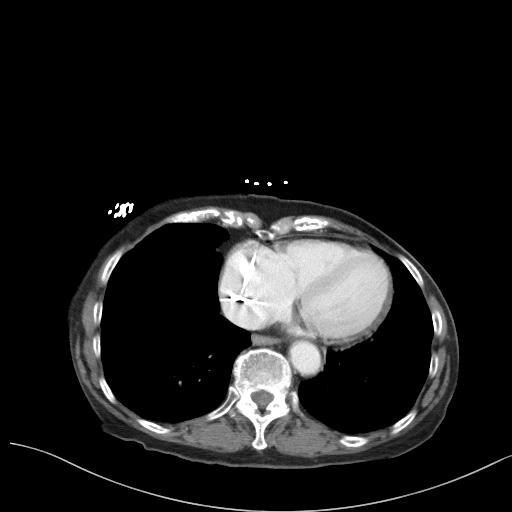

[Series 5: coronal st · coronal · 0.75mm/px · 3 of 77 slices shown]
[im 26/77  soft-tissue]
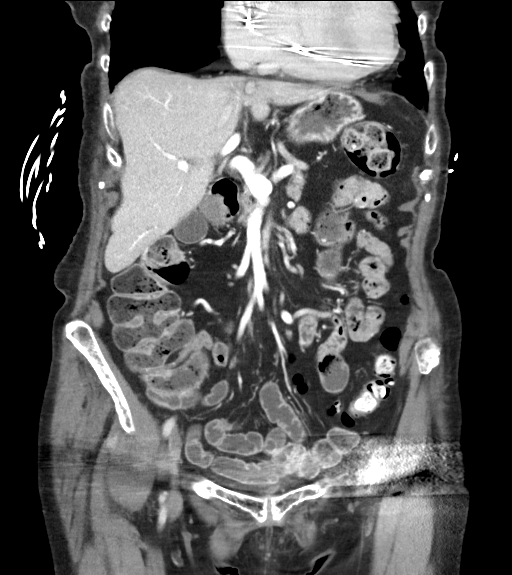
[im 34/77  soft-tissue]
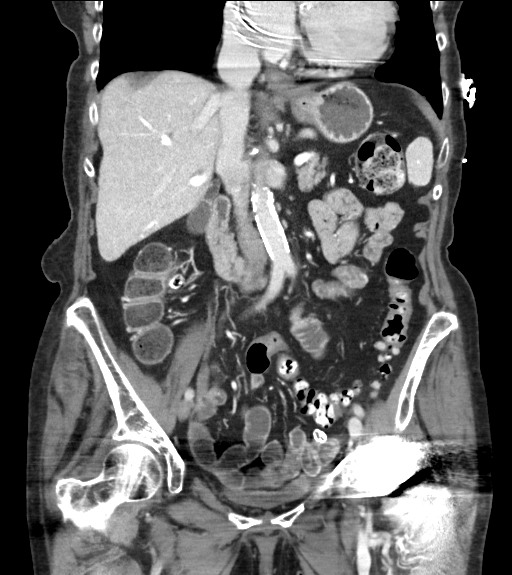
[im 43/77  soft-tissue]
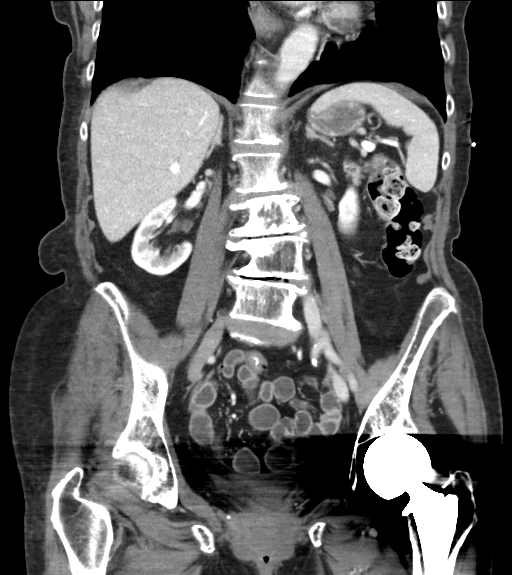

[16 of 46 positions shown; findings below may reference images not displayed]

FINDINGS: Lower chest: Mild scarring in the lung bases. Pacemaker leads in the
right atrium and right ventricular apex. Aortic atherosclerosis.

Hepatobiliary: No suspicious cystic or solid hepatic lesions. No
intra or extrahepatic biliary ductal dilatation. Gallbladder is
normal in appearance.

Pancreas: No pancreatic mass. No pancreatic ductal dilatation. No
pancreatic or peripancreatic fluid or inflammatory changes.

Spleen: Unremarkable.

Adrenals/Urinary Tract: Bilateral kidneys and bilateral adrenal
glands are normal in appearance. No hydroureteronephrosis. Urinary
bladder is partially obscured by beam hardening artifact from the
left hip arthroplasty, but otherwise unremarkable in appearance.

Stomach/Bowel: The appearance of the stomach is normal. No
pathologic dilatation of small bowel or colon. Numerous colonic
diverticulae are noted, without surrounding inflammatory changes to
suggest an acute diverticulitis at this time. Stool burden does not
appear excessive. The appendix is not confidently identified and may
be surgically absent. Regardless, there are no inflammatory changes
noted adjacent to the cecum to suggest the presence of an acute
appendicitis at this time.

Vascular/Lymphatic: Aortic atherosclerosis, without evidence of
aneurysm or dissection in the abdominal or pelvic vasculature. No
lymphadenopathy noted in the abdomen or pelvis.

Reproductive: Status post hysterectomy. Ovaries are not confidently
identified may be surgically absent or atrophic.

Other: No significant volume of ascites.  No pneumoperitoneum.

Musculoskeletal: Status post left hip hemiarthroplasty. There are no
aggressive appearing lytic or blastic lesions noted in the
visualized portions of the skeleton.
IMPRESSION: 1. No acute findings are noted in the abdomen or pelvis.
2. Colonic diverticulosis without evidence of acute diverticulitis
at this time.
3. Overall, stool burden does not appear excessive.
4. Aortic atherosclerosis.
5. Additional incidental findings, as above.

## 2020-02-01 IMAGING — DX DG CHEST 2V
3 series · 3 of 3 positions shown · non-contrast
Comparison: Chest radiograph 09/29/2017

CLINICAL DATA: Abdominal pain.

EXAM:
CHEST - 2 VIEW

[chest lat (1 of 2)]
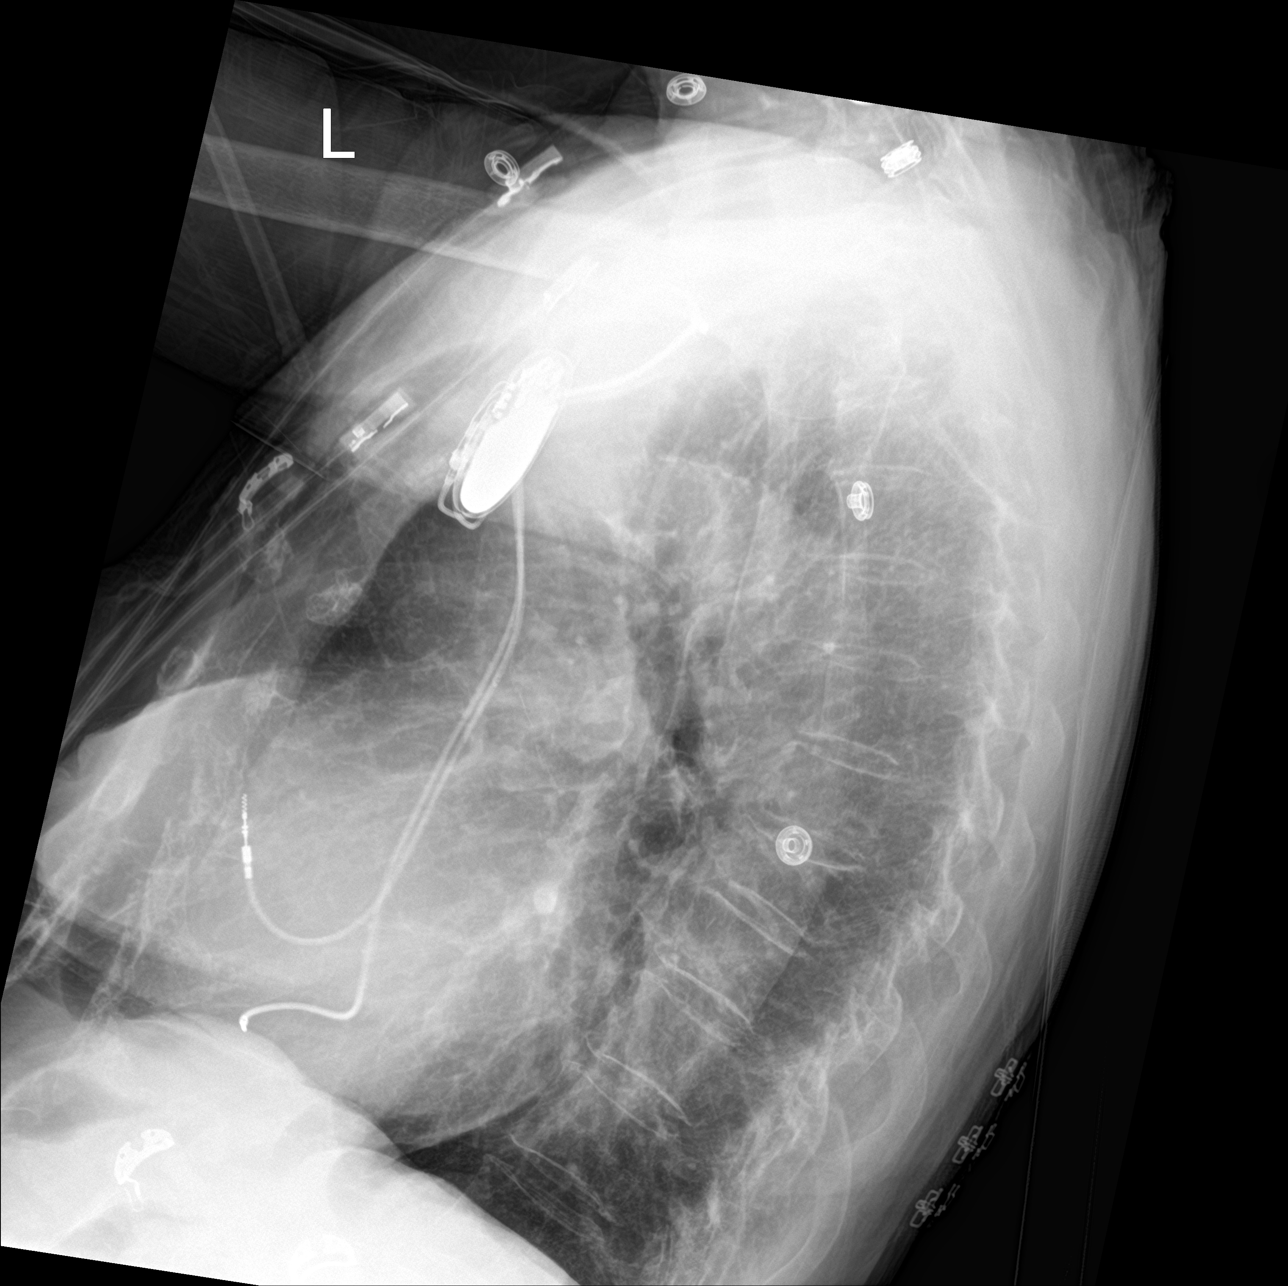

[chest ap]
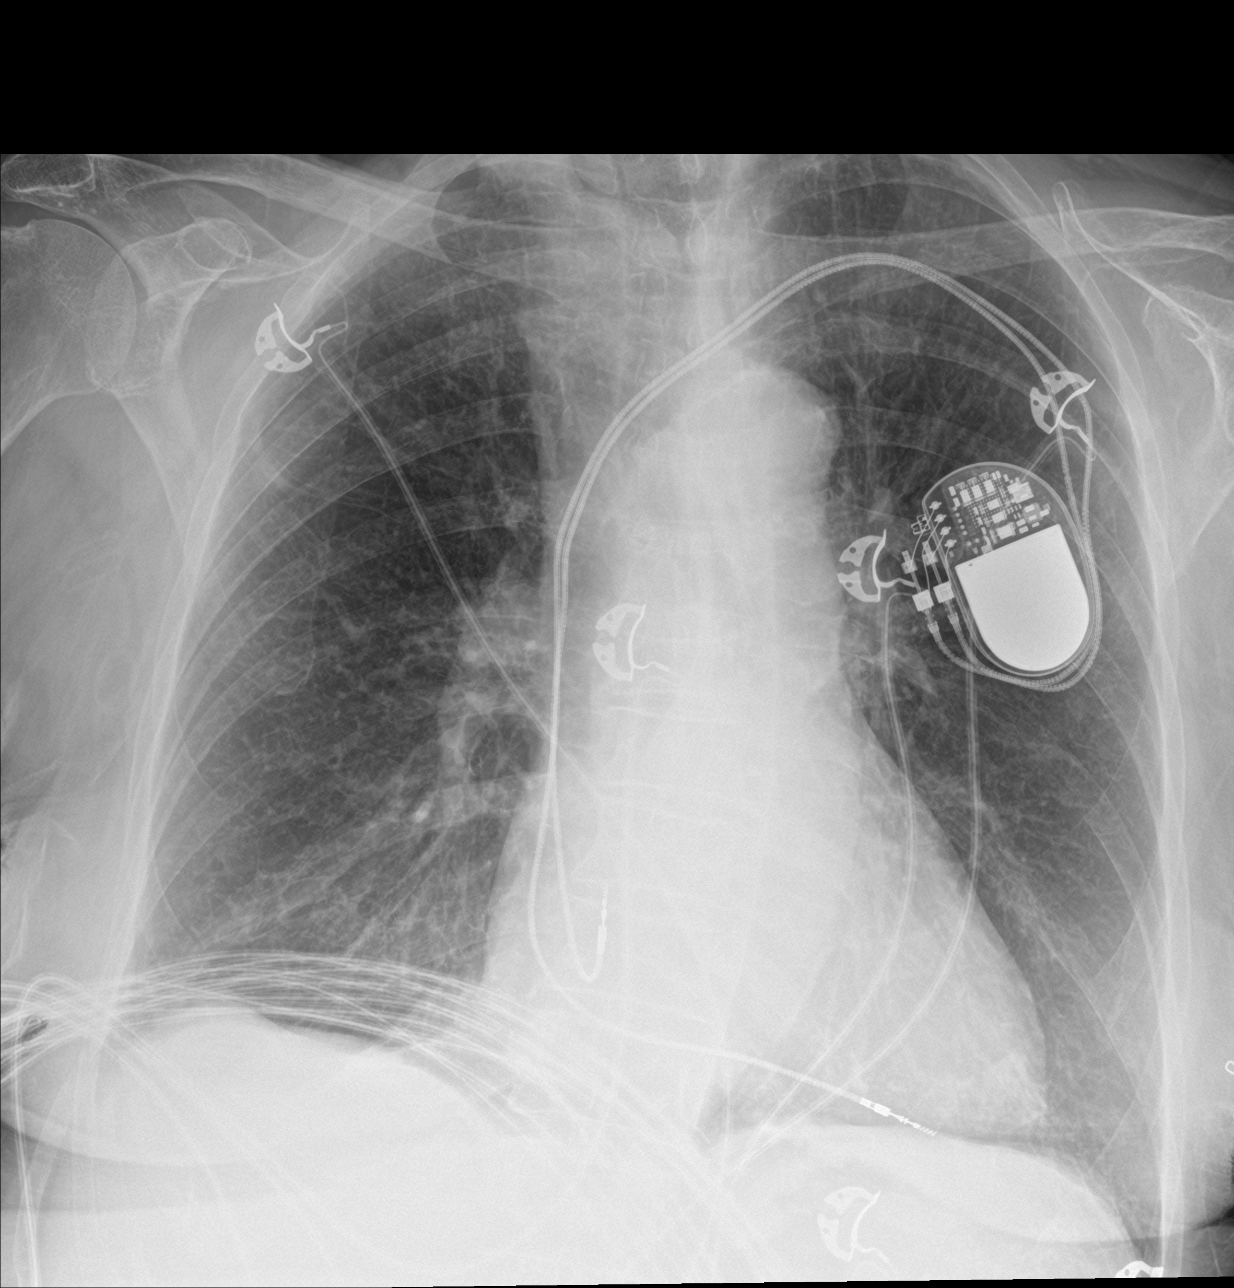

[chest lat (2 of 2)]
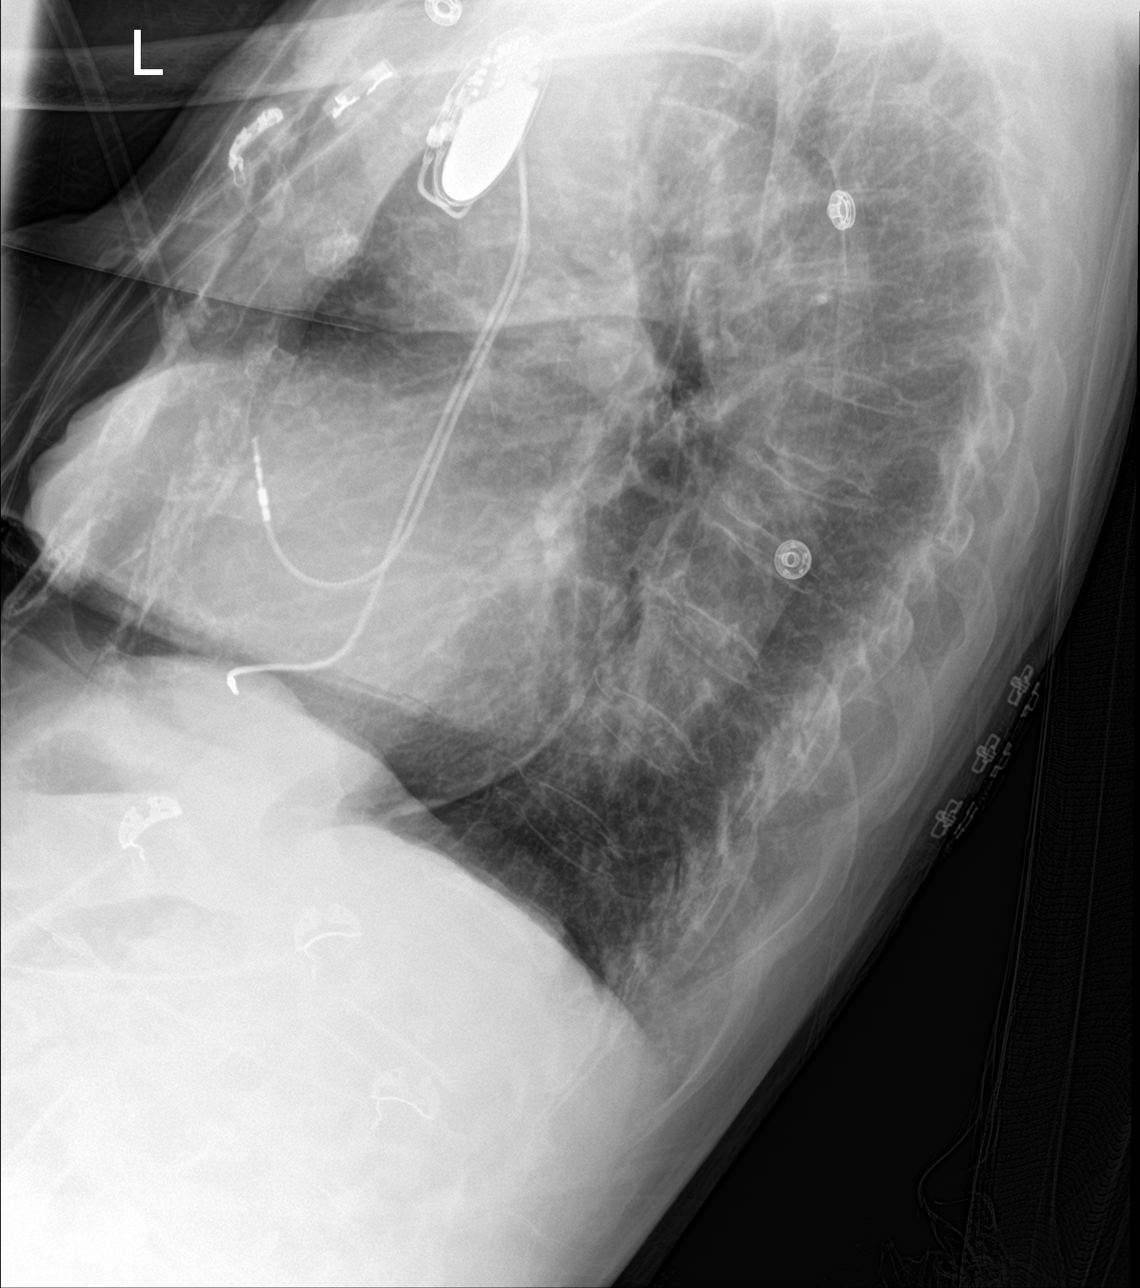

[3 of 3 positions shown; findings below may reference images not displayed]

FINDINGS: Multi lead pacer apparatus overlies the left hemithorax. Stable
cardiomegaly. Aortic atherosclerosis. No consolidative pulmonary
opacities. No pleural effusion or pneumothorax. Thoracic spine
degenerative changes.
IMPRESSION: No acute cardiopulmonary process.

## 2020-02-22 DIAGNOSIS — E063 Autoimmune thyroiditis: Secondary | ICD-10-CM | POA: Diagnosis not present

## 2020-02-22 DIAGNOSIS — M48061 Spinal stenosis, lumbar region without neurogenic claudication: Secondary | ICD-10-CM | POA: Diagnosis not present

## 2020-02-22 DIAGNOSIS — M810 Age-related osteoporosis without current pathological fracture: Secondary | ICD-10-CM | POA: Diagnosis not present

## 2020-02-22 DIAGNOSIS — I951 Orthostatic hypotension: Secondary | ICD-10-CM | POA: Diagnosis not present

## 2020-02-23 ENCOUNTER — Other Ambulatory Visit: Payer: Self-pay | Admitting: Cardiovascular Disease

## 2020-03-01 ENCOUNTER — Emergency Department (HOSPITAL_COMMUNITY): Payer: Medicare Other

## 2020-03-01 ENCOUNTER — Other Ambulatory Visit: Payer: Self-pay

## 2020-03-01 ENCOUNTER — Emergency Department (HOSPITAL_COMMUNITY)
Admission: EM | Admit: 2020-03-01 | Discharge: 2020-03-01 | Disposition: A | Discharge status: Home / Self Care | Payer: Medicare Other | Attending: Emergency Medicine | Admitting: Emergency Medicine

## 2020-03-01 ENCOUNTER — Encounter (HOSPITAL_COMMUNITY): Payer: Self-pay | Admitting: *Deleted

## 2020-03-01 DIAGNOSIS — G20 Parkinson's disease: Secondary | ICD-10-CM | POA: Insufficient documentation

## 2020-03-01 DIAGNOSIS — Z7901 Long term (current) use of anticoagulants: Secondary | ICD-10-CM | POA: Insufficient documentation

## 2020-03-01 DIAGNOSIS — K5900 Constipation, unspecified: Secondary | ICD-10-CM | POA: Insufficient documentation

## 2020-03-01 DIAGNOSIS — Z950 Presence of cardiac pacemaker: Secondary | ICD-10-CM | POA: Diagnosis not present

## 2020-03-01 DIAGNOSIS — E039 Hypothyroidism, unspecified: Secondary | ICD-10-CM | POA: Diagnosis not present

## 2020-03-01 DIAGNOSIS — R109 Unspecified abdominal pain: Secondary | ICD-10-CM

## 2020-03-01 DIAGNOSIS — R1030 Lower abdominal pain, unspecified: Secondary | ICD-10-CM | POA: Diagnosis not present

## 2020-03-01 DIAGNOSIS — Z79899 Other long term (current) drug therapy: Secondary | ICD-10-CM | POA: Insufficient documentation

## 2020-03-01 DIAGNOSIS — I480 Paroxysmal atrial fibrillation: Secondary | ICD-10-CM | POA: Insufficient documentation

## 2020-03-01 LAB — URINALYSIS, ROUTINE W REFLEX MICROSCOPIC
Bacteria, UA: NONE SEEN
Bilirubin Urine: NEGATIVE
Glucose, UA: NEGATIVE mg/dL
Ketones, ur: NEGATIVE mg/dL
Leukocytes,Ua: NEGATIVE
Nitrite: NEGATIVE
Protein, ur: NEGATIVE mg/dL
Specific Gravity, Urine: >1.046 — ABNORMAL HIGH (ref 1.005–1.030)
pH: 6 (ref 5.0–8.0)

## 2020-03-01 LAB — COMPREHENSIVE METABOLIC PANEL
ALT: 11 U/L (ref 0–44)
AST: 20 U/L (ref 15–41)
Albumin: 4.1 g/dL (ref 3.5–5.0)
Alkaline Phosphatase: 88 U/L (ref 38–126)
Anion gap: 10 (ref 5–15)
BUN: 22 mg/dL (ref 8–23)
CO2: 25 mmol/L (ref 22–32)
Calcium: 9.1 mg/dL (ref 8.9–10.3)
Chloride: 104 mmol/L (ref 98–111)
Creatinine, Ser: 1.11 mg/dL — ABNORMAL HIGH (ref 0.44–1.00)
GFR, Estimated: 48 mL/min — ABNORMAL LOW (ref >60)
Glucose, Bld: 108 mg/dL — ABNORMAL HIGH (ref 70–99)
Potassium: 3.5 mmol/L (ref 3.5–5.1)
Sodium: 139 mmol/L (ref 135–145)
Total Bilirubin: 0.9 mg/dL (ref 0.3–1.2)
Total Protein: 7.1 g/dL (ref 6.5–8.1)

## 2020-03-01 LAB — CBC WITH DIFFERENTIAL/PLATELET
Abs Immature Granulocytes: 0.06 10*3/uL (ref 0.00–0.07)
Basophils Absolute: 0.1 10*3/uL (ref 0.0–0.1)
Basophils Relative: 1 %
Eosinophils Absolute: 0.2 10*3/uL (ref 0.0–0.5)
Eosinophils Relative: 2 %
HCT: 48.5 % — ABNORMAL HIGH (ref 36.0–46.0)
Hemoglobin: 15.7 g/dL — ABNORMAL HIGH (ref 12.0–15.0)
Immature Granulocytes: 1 %
Lymphocytes Relative: 14 %
Lymphs Abs: 1.7 10*3/uL (ref 0.7–4.0)
MCH: 29.5 pg (ref 26.0–34.0)
MCHC: 32.4 g/dL (ref 30.0–36.0)
MCV: 91.2 fL (ref 80.0–100.0)
Monocytes Absolute: 0.9 10*3/uL (ref 0.1–1.0)
Monocytes Relative: 8 %
Neutro Abs: 8.8 10*3/uL — ABNORMAL HIGH (ref 1.7–7.7)
Neutrophils Relative %: 74 %
Platelets: 210 10*3/uL (ref 150–400)
RBC: 5.32 MIL/uL — ABNORMAL HIGH (ref 3.87–5.11)
RDW: 13.4 % (ref 11.5–15.5)
WBC: 11.7 10*3/uL — ABNORMAL HIGH (ref 4.0–10.5)
nRBC: 0 % (ref 0.0–0.2)

## 2020-03-01 LAB — LIPASE, BLOOD: Lipase: 28 U/L (ref 11–51)

## 2020-03-01 MED ORDER — MINERAL OIL RE ENEM
1.0000 | ENEMA | Freq: Once | RECTAL | Status: AC
  Administered 2020-03-01: 1 via RECTAL
  Filled 2020-03-01: qty 1

## 2020-03-01 MED ORDER — SODIUM CHLORIDE 0.9 % IV BOLUS: 500.0000 mL | Freq: Once | INTRAVENOUS | Status: DC

## 2020-03-01 MED ORDER — LIDOCAINE HCL URETHRAL/MUCOSAL 2 % EX GEL
1.0000 "application " | Freq: Once | CUTANEOUS | Status: AC
  Administered 2020-03-01: 1 via TOPICAL
  Filled 2020-03-01: qty 10

## 2020-03-01 MED ORDER — FENTANYL CITRATE (PF) 100 MCG/2ML IJ SOLN
50.0000 ug | Freq: Once | INTRAMUSCULAR | Status: AC
  Administered 2020-03-01: 50 ug via INTRAVENOUS
  Filled 2020-03-01: qty 2

## 2020-03-01 MED ORDER — IOHEXOL 300 MG/ML  SOLN
75.0000 mL | Freq: Once | INTRAMUSCULAR | Status: AC | PRN
  Administered 2020-03-01: 75 mL via INTRAVENOUS

## 2020-03-01 MED ORDER — SODIUM CHLORIDE 0.9 % IV BOLUS
500.0000 mL | Freq: Once | INTRAVENOUS | Status: AC
  Administered 2020-03-01: 500 mL via INTRAVENOUS

## 2020-03-01 NOTE — ED Notes (Addendum)
Ambulated to toilet. Pt had bm with a moderate size. Failed to catch urine sample wth BM

## 2020-03-01 NOTE — ED Notes (Signed)
Mineral oil enema administered. Pt advised to hold it as long as possible. VS stable at this time.

## 2020-03-01 NOTE — ED Triage Notes (Signed)
Pt c/o constipation for 2 days and has been taking miralax (4 doses in 2 days).  Pt states has been having small hard stool passing.  Pt incont. Of stool as well. Pt with hx of constipation. + poor appetite per pt.

## 2020-03-01 NOTE — ED Notes (Signed)
Patient transported to CT 

## 2020-03-01 NOTE — Discharge Instructions (Addendum)
Please follow up with your primary care provider within 5-7 days for re-evaluation of your symptoms. If you do not have a primary care provider, information for a healthcare clinic has been provided for you to make arrangements for follow up care. Please return to the emergency department for any new or worsening symptoms.  For your constipation start using MiraLAX as we discussed.  Your blood pressure was also elevated today, please follow-up with your primary care about this.  I also want you to drink plenty of fluids, as we discussed your kidney function was slightly elevated today and you do look dehydrated, please drink plenty of water when you get home and have your creatinine checked in the next week with your primary care provider.

## 2020-03-01 NOTE — ED Provider Notes (Signed)
[facility] Provider Note   CSN: [id number] Arrival date & time: 03/01/20  1250     History Chief Complaint  Patient presents with  . Constipation    Abigail Taylor is a 87 y.o. female.  HPI   87 year old female with a history of aortic insufficiency, a fib, chronic back pain, constipation, hyperlipidemia, hypothyroidism, osteoarthritis, pacemaker, Parkinson's disease, who presents to the emergency department today for evaluation of constipation.  States she has been constipated for the last several days.  Her stool is very hard and she feels like she is impacted.  She had a little bit of output this morning after taking several doses of over-the-counter medications but she still feels constipated.  She started experiencing lower abdominal pain today while waiting in the emergency department waiting room.  She denies any vomiting or fevers.  She denies any urinary symptoms.  Past Medical History:  Diagnosis Date  . Angina at rest, with the tachycardia 07/05/2011  . Aortic insufficiency 07/05/2011  . Atrial fibrillation ([place])   . Brady-tachy syndrome ([place])    Dual chamber Medtronic pacemaker Adapta  . Chronic back pain   . Constipation 12/27/2011  . Constipation   . Gait instability   . Hyperlipidemia   . Hypothyroidism   . Normal cardiac stress test 2013  . OA (osteoarthritis)   . Pacemaker   . Parkinson's disease ([place])   . Tremor     Patient Active Problem List   Diagnosis Date Noted  . Pressure injury of skin 10/05/2018  . Atrial fibrillation with rapid ventricular response ([place]) 10/04/2018  . Chronic anticoagulation   . Syncope   . Anticoagulated   . Epigastric pain   . Diverticulosis   . Melena 11/04/2017  . Atrial fibrillation with RVR ([place]) 09/29/2017  . RBD (REM behavioral disorder) 01/10/2017  . Neurogenic orthostatic hypotension ([place]) 01/10/2017  . Tachy-brady syndrome ([place]) 06/23/2016  . Closed subcapital fracture of left femur ([place])  06/23/2016  . Orthostatic hypotension due to Parkinson's disease ([place]) 06/23/2016  . PD (Parkinson's disease) ([place]) 04/21/2016  . Hypokalemia 04/21/2016  . Normal coronary arteries 2001 05/08/2014  . Pacemaker 08/10/2012  . Aortic insufficiency 08/10/2012  . Constipation 12/27/2011  . Family hx of colon cancer 12/27/2011  . HTN (hypertension) 07/06/2011  . Hypothyroidism 07/05/2011  . Paroxysmal atrial fibrillation ([place]) 07/05/2011  . Sinus pause, 7 seconds post conversion, S/P MDT pacemaker 07/05/11 07/05/2011  . Dyslipidemia 07/05/2011  . Angina at rest, with rapid AF 07/05/2011    Past Surgical History:  Procedure Laterality Date  . ABDOMINAL HYSTERECTOMY    . CARDIAC CATHETERIZATION  05/26/1999   normal  . CARDIAC CATHETERIZATION  2001   normal coronary arteries  . cataract    . COLONOSCOPY N/A 11/15/2012   Procedure: COLONOSCOPY;  Surgeon: [name], MD;  [facility];  Service: Endoscopy;  Laterality: N/A;  1030  . ESOPHAGOGASTRODUODENOSCOPY (EGD) WITH PROPOFOL N/A 11/06/2017   Procedure: ESOPHAGOGASTRODUODENOSCOPY (EGD) WITH PROPOFOL;  Surgeon: [name], MD;  Location: [facility];  Service: Endoscopy;  Laterality: N/A;  . HIP ARTHROPLASTY Left 06/24/2016   Procedure: ARTHROPLASTY BIPOLAR HIP (HEMIARTHROPLASTY);  Surgeon: Altamese Rolla, MD;  Location: [facility];  Service: Orthopedics;  Laterality: Left;  . INSERT / REPLACE / REMOVE PACEMAKER    . NM MYOVIEW LTD  07/19/2011   normal  . PERMANENT PACEMAKER INSERTION  07/05/2011   Medtronic Adapta dual chamber  . PERMANENT PACEMAKER INSERTION N/A 07/05/2011  Procedure: PERMANENT PACEMAKER INSERTION;  Surgeon: [name], MD;  Location: [facility];  Service: Cardiovascular;  Laterality: N/A;     OB History   No obstetric history on file.     Family History  Problem Relation Age of Onset  . Colon cancer Mother 54  . Heart attack Mother   . Cancer Father   . Kidney cancer Sister   . Healthy  Brother   . Healthy Son   . Healthy Daughter   . Suicidality Son     Social History   Tobacco Use  . Smoking status: Never Smoker  . Smokeless tobacco: Never Used  Vaping Use  . Vaping Use: Never used  Substance Use Topics  . Alcohol use: No    Alcohol/week: 0.0 standard drinks  . Drug use: No    Home Medications Prior to Admission medications   Medication Sig Start Date End Date Taking? Authorizing Provider  acetaminophen (TYLENOL) 500 MG tablet Take 1,000 mg by mouth every 6 (six) hours as needed for mild pain.   Yes [provider]  amiodarone (PACERONE) 200 MG tablet TAKE 1 TABLET (200 MG TOTAL) BY MOUTH EVERY OTHER DAY. 11/14/19  Yes Croitoru, Mihai, MD  Carbidopa-Levodopa ER (SINEMET CR) 25-100 MG tablet controlled release 1 at 8am/noon/4pm Patient taking differently: Take 1 tablet by mouth See admin instructions. 1 at 8am/noon/4pm 09/24/19  Yes Tat, Eustace Quail, DO  cycloSPORINE (RESTASIS) 0.05 % ophthalmic emulsion Place 1 drop into the left eye every evening.   Yes [provider]  doxycycline (VIBRA-TABS) 100 MG tablet Take 100 mg by mouth 2 (two) times daily. 10/10/18  Yes [provider]  ELIQUIS 2.5 MG TABS tablet TAKE 1 TABLET BY MOUTH TWICE A DAY 02/24/20  Yes Croitoru, Mihai, MD  fludrocortisone (FLORINEF) 0.1 MG tablet Take 1 tablet (100 mcg total) by mouth 4 (four) times a week. 10/17/19  Yes Croitoru, Mihai, MD  levothyroxine (SYNTHROID, LEVOTHROID) 75 MCG tablet Take 75 mcg by mouth daily before breakfast.   Yes [provider]  Magnesium Hydroxide (DULCOLAX PO) Take 2 tablets by mouth daily as needed.   Yes [provider]  polyethylene glycol (MIRALAX / GLYCOLAX) 17 g packet Take 17 g by mouth daily.   Yes [provider]  rosuvastatin (CRESTOR) 10 MG tablet Take 10 mg by mouth at bedtime. Reported on 07/09/2015   Yes [provider]  XIIDRA 5 % SOLN Place 1 drop into the left eye 2 (two) times daily.   10/04/16  Yes [provider]    Allergies    Patient has no known allergies.  Review of Systems   Review of Systems  Constitutional: Negative for chills and fever.  HENT: Negative for ear pain and sore throat.   Eyes: Negative for visual disturbance.  Respiratory: Negative for cough and shortness of breath.   Cardiovascular: Negative for chest pain.  Gastrointestinal: Positive for abdominal pain and constipation. Negative for diarrhea, nausea and vomiting.  Genitourinary: Negative for dysuria and hematuria.  Musculoskeletal: Negative for back pain.  Skin: Negative for rash.  Neurological: Negative for seizures and syncope.  All other systems reviewed and are negative.   Physical Exam Updated Vital Signs BP (!) 188/69   Pulse 65   Temp (!) 97.4 F (36.3 C) (Oral)   Resp 14   Ht 5\' 6"  (1.676 m)   Wt 54.4 kg   SpO2 97%   BMI 19.37 kg/m   Physical Exam Vitals and nursing  note reviewed.  Constitutional:      General: She is not in acute distress.    Appearance: She is well-developed and well-nourished.  HENT:     Head: Normocephalic and atraumatic.  Eyes:     Conjunctiva/sclera: Conjunctivae normal.  Cardiovascular:     Rate and Rhythm: Normal rate and regular rhythm.     Heart sounds: Normal heart sounds. No murmur heard.   Pulmonary:     Effort: Pulmonary effort is normal. No respiratory distress.     Breath sounds: Normal breath sounds. No wheezing, rhonchi or rales.  Abdominal:     General: Bowel sounds are normal.     Palpations: Abdomen is soft.     Tenderness: There is abdominal tenderness (mild bilat lower abd ttp). There is no guarding or rebound.  Musculoskeletal:        General: No edema.     Cervical back: Neck supple.  Skin:    General: Skin is warm and dry.  Neurological:     Mental Status: She is alert.  Psychiatric:        Mood and Affect: Mood and affect normal.     ED Results / Procedures / Treatments   Labs (all labs ordered  are listed, but only abnormal results are displayed) Labs Reviewed  COMPREHENSIVE METABOLIC PANEL - Abnormal; Notable for the following components:      Result Value   Glucose, Bld 108 (*)    Creatinine, Ser 1.11 (*)    GFR, Estimated 48 (*)    All other components within normal limits  CBC WITH DIFFERENTIAL/PLATELET - Abnormal; Notable for the following components:   WBC 11.7 (*)    RBC 5.32 (*)    Hemoglobin 15.7 (*)    HCT 48.5 (*)    Neutro Abs 8.8 (*)    All other components within normal limits  LIPASE, BLOOD  URINALYSIS, ROUTINE W REFLEX MICROSCOPIC    EKG None  Radiology CT ABDOMEN PELVIS W CONTRAST  Result Date: 03/01/2020 CLINICAL DATA:  Lower abdominal pain with constipation x2 days. EXAM: CT ABDOMEN AND PELVIS WITH CONTRAST TECHNIQUE: Multidetector CT imaging of the abdomen and pelvis was performed using the standard protocol following bolus administration of intravenous contrast. CONTRAST:  54mL OMNIPAQUE IOHEXOL 300 MG/ML  SOLN COMPARISON:  CT abdomen pelvis November 04, 2017, lumbar radiograph December 08, 2019 and lumbar CT August 16, 2018. FINDINGS: Lower chest: Scarring in the lung bases. Pacemaker leads in the right atrium right ventricle. No pericardial effusion. Hepatobiliary: No focal liver abnormality is seen. No gallstones, gallbladder wall thickening, or biliary dilatation. Pancreas: Unremarkable. No pancreatic ductal dilatation or surrounding inflammatory changes. Spleen: Normal in size without focal abnormality. Adrenals/Urinary Tract: Similar left adrenal thickening. Right adrenals unremarkable. No hydronephrosis. No nephrolithiasis. No suspicious renal masses. Evaluation of bladder is partially obscured by left hip arthroplasty otherwise unremarkable. Stomach/Bowel: Small hiatal hernia. Unremarkable decompressed appearance of the stomach. No suspicious small bowel wall thickening or dilation. The appendix is not confidently identified may be surgically absent.  Colonic diverticulosis without acute inflammatory findings to suggest acute diverticulitis. Small volume of formed stool in the colon. Vascular/Lymphatic: Aortic atherosclerosis. No enlarged abdominal or pelvic lymph nodes. Reproductive: Status post hysterectomy. No adnexal masses. Other: No abdominopelvic ascites. Musculoskeletal: Similar appearance of the superior endplate compression fracture at L1 with approximately 90% height loss in the mid vertebral body, and marked approximately 5 mm bony retropulsion into the canal. Diffuse demineralization of bone. Multilevel degenerative change of the spine.  Left hip arthroplasty. IMPRESSION: 1. No acute abdominopelvic findings. 2. Small volume of formed stool in the colon. Colonic diverticulosis without evidence of acute diverticulitis. 3. Similar appearance of the superior endplate compression fracture at L1 with approximately 90% height loss in the mid vertebral body, and approximately 5 mm bony retropulsion into the spinal canal. 4. Small hiatal hernia. 5. Aortic atherosclerosis. Aortic Atherosclerosis (ICD10-I70.0). Electronically Signed   By: Dahlia Bailiff MD   On: 03/01/2020 16:21    Procedures Procedures   Medications Ordered in ED Medications  sodium chloride 0.9 % bolus 500 mL (0 mLs Intravenous Stopped 03/01/20 1601)  iohexol (OMNIPAQUE) 300 MG/ML solution 75 mL (75 mLs Intravenous Contrast Given 03/01/20 1609)  fentaNYL (SUBLIMAZE) injection 50 mcg (50 mcg Intravenous Given 03/01/20 1737)  lidocaine (XYLOCAINE) 2 % jelly 1 application (1 application Topical Given 03/01/20 1737)  mineral oil enema 1 enema (1 enema Rectal Given 03/01/20 1853)    ED Course  I have reviewed the triage vital signs and the nursing notes.  Pertinent labs & imaging results that were available during my care of the patient were reviewed by me and considered in my medical decision making (see chart for details).    MDM Rules/Calculators/A&P                          87 y/o  F presenting with constipation and abd pain  Reviewed/interpreted labs CBC with mild leukocytosis, mildly elevated hgb, likely hemoconcentration CMP with mildly elevated cr, otherwise unremarkable, small fluid bolus given Lipase unremarkable UA pending  Reviewed/interpreted imaging CT abd/pelvis -  1. No acute abdominopelvic findings. 2. Small volume of formed stool in the colon. Colonic diverticulosis without evidence of acute diverticulitis. 3. Similar appearance of the superior endplate compression fracture at L1 with approximately 90% height loss in the mid vertebral body, and approximately 5 mm bony retropulsion into the spinal canal. 4. Small hiatal hernia. 5. Aortic atherosclerosis. Aortic Atherosclerosis  DRE was performed and there was no significant stool in the rectal vault. We will given an enema and reassess.   At shift change, pt pending UA. If negative, she is safe for d/c home with close pcp f/u. Care transitioned to [facility], PA-C.   Final Clinical Impression(s) / ED Diagnoses Final diagnoses:  Abdominal pain, unspecified abdominal location  Constipation, unspecified constipation type    Rx / DC Orders ED Discharge Orders    None       Bishop Dublin 03/01/20 1903    Lajean Saver, MD 03/02/20 1511

## 2020-03-01 NOTE — ED Notes (Signed)
Ambulated with assistance to restroom. Educated to call when finished.

## 2020-03-01 NOTE — ED Provider Notes (Signed)
Care of the patient was assumed from [name] PA-C at handoff; see this physician's note for complete history of present illness, review of systems, and physical exam.  Briefly, the patient is a 87 y.o. female who presented to the ED with constipation.  Patient was evaluated by Dr. Ashok Cordia as well.  Plan at time of handoff:  Awaiting urinalysis.  Patient has received enema.   Physical Exam  BP (!) 188/69   Pulse 65   Temp (!) 97.4 F (36.3 C) (Oral)   Resp 14   Ht 5\' 6"  (1.676 m)   Wt 54.4 kg   SpO2 97%   BMI 19.37 kg/m   Physical Exam Constitutional:      General: She is not in acute distress.    Appearance: Normal appearance. She is not ill-appearing, toxic-appearing or diaphoretic.  HENT:     Head: Normocephalic and atraumatic.  Cardiovascular:     Rate and Rhythm: Normal rate and regular rhythm.     Pulses: Normal pulses.  Pulmonary:     Effort: Pulmonary effort is normal.     Breath sounds: Normal breath sounds.  Abdominal:     General: There is no distension.     Tenderness: There is no abdominal tenderness. There is no guarding or rebound.     Hernia: No hernia is present.  Musculoskeletal:        General: Normal range of motion.  Skin:    General: Skin is warm and dry.     Capillary Refill: Capillary refill takes less than 2 seconds.  Neurological:     General: No focal deficit present.     Mental Status: She is alert and oriented to person, place, and time.  Psychiatric:        Mood and Affect: Mood normal.        Behavior: Behavior normal.        Thought Content: Thought content normal.     ED Course/Procedures     Procedures  MDM    87 year old female who presents the emerge department today for evaluation of constipation.  Constipated for the last several days, CT with no acute abdominopelvic findings.  There are small volume of formed stool in the colon.  Digital exam was performed and there was no significant stool, enema given.  Patient states  that after enema she states that she feels much better.  Repeat abdominal exam with no peritoneal signs.  Patient's blood pressure elevated, did discuss that patient needs to follow-up with PCP about this.  Patient also seems to have increasing creatinine from around 0.7-1.1, high specific gravity in urine, did discuss with patient that she is most likely dehydrated, small bolus of fluid given, did discuss that we may need to give some more fluids however patient states that she wants to leave at this time.  Patient is drinking water currently.  Will have patient follow-up with PCP in regards to this.  Patient and husband expressed understanding.  Urinalysis without signs of infection.  Patient to be discharged.  Symptomatic treatment discussed in regards to constipation management.  Doubt need for further emergent work up at this time. I explained the diagnosis and have given explicit precautions to return to the ER including for any other new or worsening symptoms. The patient understands and accepts the medical plan as it's been dictated and I have answered their questions. Discharge instructions concerning home care and prescriptions have been given. The patient is STABLE and is discharged to home  in good condition.  I discussed this case with my attending physician who cosigned this note including patient's presenting symptoms, physical exam, and planned diagnostics and interventions. Attending physician stated agreement with plan or made changes to plan which were implemented.   Attending physician assessed patient at bedside.     Abigail Client, PA-C 03/01/20 2143    Abigail Saver, MD 03/02/20 [phone]

## 2020-03-23 DIAGNOSIS — H04123 Dry eye syndrome of bilateral lacrimal glands: Secondary | ICD-10-CM | POA: Diagnosis not present

## 2020-03-23 NOTE — Progress Notes (Signed)
Assessment/Plan:   1.  Parkinsons Disease  -instead of carbidopa/levodopa 25/100 2 po bid told patient to take 2 in the AM, 1 in the afternoon and 1 in the evening   2. 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 -She is free of hallucinations right now.  Any of the antipsychotics, with the exception of Abilify are going to lengthen the QT interval. We talked about Abilify. We discussed that we could certainly use this in the future if needed, but that it does increase risk of making parkinsonian symptoms worse.    3.REM behavior disorder -This is commonly associated with PD and the patient is experiencing this. We discussed that this can be very serious and even harmful. We talked about medications as well as physical barriers to put in the bed (particularly soft bed rails, pillow barriers). We talked about moving the night stand so that it is not so close to the side of the bed.  4. Orthostatic hypotension -On fludrocortisone but have reduced this to only 4 days a week because her blood pressure was running high.  Was still a bit high today.  They are not following it at home.  Cardiology prescribing her meds.  5. PAF -Follows with cardiology. On amiodarone   Subjective:   Abigail Taylor was seen today in follow up for Parkinsons disease.  My previous records were reviewed prior to todays visit as well as outside records available to me.  This patient is accompanied in the office by her spouse who supplements the history.  Last visit, I sent a message to the patient's cardiologist about the fact that her PA started her on lisinopril for high blood pressure, despite the fact that she was on Florinef.  I wanted to make him aware.  He stopped the lisinopril and reduced to the Florinef to only 4 days a week.  This was back in September.  She has not been back to see  her cardiologist since.  They don't know what it has been running at home.  She was in the emergency room fairly recently with constipation.  Not having hallucinations any longer.  She has erythema around the eye on the R and was told yesterday by ophth to see dermatology.  Current prescribed movement disorder medications: Carbidopa/levodopa 25/100 CR,1tablets at 7 AM, 1tablets at 11 AM, 1 tablet at 4 PM/bed (pt actually taking at noon/4pm/bed) - pts husband states that she is taking 2 po bid. Florinef 0.1 mg daily.    ALLERGIES:  No Known Allergies  CURRENT MEDICATIONS:  Outpatient Encounter Medications as of 03/24/2020  Medication Sig  . acetaminophen (TYLENOL) 500 MG tablet Take 1,000 mg by mouth every 6 (six) hours as needed for mild pain.  Marland Kitchen amiodarone (PACERONE) 200 MG tablet TAKE 1 TABLET (200 MG TOTAL) BY MOUTH EVERY OTHER DAY.  . Carbidopa-Levodopa ER (SINEMET CR) 25-100 MG tablet controlled release 1 at 8am/noon/4pm (Patient taking differently: Take 1 tablet by mouth See admin instructions. Patient take 2 tabs in the morning and 2 tabs at night per husband)  . cycloSPORINE (RESTASIS) 0.05 % ophthalmic emulsion Place 1 drop into the left eye every evening.  Marland Kitchen doxycycline (VIBRA-TABS) 100 MG tablet Take 100 mg by mouth 2 (two) times daily.  Marland Kitchen ELIQUIS 2.5 MG TABS tablet TAKE 1 TABLET BY MOUTH TWICE A DAY  . fludrocortisone (FLORINEF) 0.1 MG tablet Take 1 tablet (100 mcg total) by mouth 4 (four) times a  week.  . levothyroxine (SYNTHROID, LEVOTHROID) 75 MCG tablet Take 75 mcg by mouth daily before breakfast.  . linaclotide (LINZESS) 145 MCG CAPS capsule Take 1 capsule by mouth daily. Only takes as needed  . Magnesium Hydroxide (DULCOLAX PO) Take 2 tablets by mouth daily as needed.  . polyethylene glycol (MIRALAX / GLYCOLAX) 17 g packet Take 17 g by mouth daily.  . rosuvastatin (CRESTOR) 10 MG tablet Take 10 mg by mouth at bedtime. Reported on 07/09/2015  . XIIDRA 5 % SOLN Place 1 drop  into the left eye at bedtime.   No facility-administered encounter medications on file as of 03/24/2020.    Objective:   PHYSICAL EXAMINATION:    VITALS:   Vitals:   03/24/20 1426  BP: (!) 152/78  Pulse: 64  SpO2: 98%  Weight: 124 lb (56.2 kg)  Height: 5\' 6"  (1.676 m)    GEN:  The patient appears stated age and is in NAD. HEENT:  Normocephalic, atraumatic.  The mucous membranes are moist. The superficial temporal arteries are without ropiness or tenderness.  There is erythema around the eye on the right.   CV:  RRR Lungs:  CTAB Neck/HEME:  There are no carotid bruits bilaterally.  Neurological examination:  Orientation: The patient is alert and oriented x3.  She asks this examiner about her children Cranial nerves: There is good facial symmetry with facial hypomimia. The speech is fluent and clear. Soft palate rises symmetrically and there is no tongue deviation. Hearing is intact to conversational tone. Sensation: Sensation is intact to light touch throughout Motor: Strength is at least antigravity x4.  Movement examination: Tone: There is mild increased tone in the rue Abnormal movements: there is mild intermittent rue rest tremor Coordination:  There is mild decremation with RAM's, with any form of RAMS, including alternating supination and pronation of the forearm, hand opening and closing, finger taps, heel taps and toe taps, R>L Gait and Station: The patient has  difficulty arising out of a deep-seated chair without the use of the hands. The patient's stride length is decreased (not using the walker)   I have reviewed and interpreted the following labs independently    Chemistry      Component Value Date/Time   NA 139 03/01/2020 1512   K 3.5 03/01/2020 1512   CL 104 03/01/2020 1512   CO2 25 03/01/2020 1512   BUN 22 03/01/2020 1512   CREATININE 1.11 (H) 03/01/2020 1512   CREATININE 0.74 04/30/2015 0853      Component Value Date/Time   CALCIUM 9.1 03/01/2020  1512   ALKPHOS 88 03/01/2020 1512   AST 20 03/01/2020 1512   ALT 11 03/01/2020 1512   BILITOT 0.9 03/01/2020 1512       Lab Results  Component Value Date   WBC 11.7 (H) 03/01/2020   HGB 15.7 (H) 03/01/2020   HCT 48.5 (H) 03/01/2020   MCV 91.2 03/01/2020   PLT 210 03/01/2020    Lab Results  Component Value Date   TSH 2.886 10/06/2018     Total time spent on today's visit was 20 minutes, including both face-to-face time and nonface-to-face time.  Time included that spent on review of records (prior notes available to me/labs/imaging if pertinent), discussing treatment and goals, answering patient's questions and coordinating care.  Cc:  [name], MD

## 2020-03-24 ENCOUNTER — Ambulatory Visit (INDEPENDENT_AMBULATORY_CARE_PROVIDER_SITE_OTHER): Payer: Medicare Other | Admitting: Neurology

## 2020-03-24 ENCOUNTER — Encounter: Payer: Self-pay | Admitting: Neurology

## 2020-03-24 ENCOUNTER — Other Ambulatory Visit: Payer: Self-pay

## 2020-03-24 VITALS — BP 152/78 | HR 64 | Ht 66.0 in | Wt 124.0 lb

## 2020-03-24 DIAGNOSIS — G20 Parkinson's disease: Secondary | ICD-10-CM

## 2020-03-24 MED ORDER — CARBIDOPA-LEVODOPA ER 25-100 MG PO TBCR: EXTENDED_RELEASE_TABLET | ORAL | 0 refills | Status: AC

## 2020-03-24 NOTE — Patient Instructions (Signed)
Online Resources for Power over Parkinson's Group February 2022  . [place] Online Groups  o Power over [employer] :   - Power Over Parkinson's Patient Education Group will be Wednesday, February 9th at 2pm via Zoom.   - Upcoming Power over Parkinson's Meetings:  2nd Wednesdays of the month at 2 pm:       March 9th, April 13th - Contact Amy Marriott at [email or web address] if interested in participating in this online group o Parkinson's Care Partners Group:    3rd Mondays, Contact Corwin Levins o Atypical Parkinsonian Patient Group:   4th Wednesdays, Contact Corwin Levins o If you are interested in participating in these online groups with Judson Roch, please contact her directly for how to join those meetings.  Her contact information is [email or web address].  She will send you a link to join the [employer].  (Please note that Corwin Levins , MSW, LCSW, has resigned her position at [facility], but will continue to lead the online groups temporarily)  . [place]:  [email or web address] o PD Health at Home continues:  Mindfulness Mondays, Expert Briefing Tuesdays, Wellness Wednesdays, Take Time Thursdays, Fitness Fridays -Listings for February 2022 are on the website o Upcoming Webinar:  Sights, Sounds, and Parkinson's.  Wednesday, February 2 @ 1 pm o Upcoming Webinar:  Conversations about Complementary Therapies and PD.  Wednesday, March 2 @ 1 pm o Geneticist, molecular) at [email or web address] o  Please check out their website to sign up for emails and see their full online offerings  . [place]:  [email or web address]  o Upcoming Webinar:   Moving with Mood Changes in Aging and Parkinson's:  A Look at Depression and Anxiety.  (This is a replay of a webinar originally aired June 2020)  Thursday, February 17 @ 12 noon o Check out additional information on their website to see their full online  offerings  . [place]:  [email or web address] o Upcoming Webinar:  The [place].  The Parkinson's You Don't See:  When your Autonomic System goes Off-Track.   Friday, February 18th.  Go to [email or web address], then click on Events, 12-28-1971 tab to register. o Care Partner Monthly Meetup.  With [employer] [place].  First Tuesday of each month, 2 pm o Check out additional information to Live Well Today on their website  . Parkinson and Movement Disorders (PMD) Alliance:  www.[email or web address] o [employer]:  Online Education Events o Sign up for emails, which are sent weekly to give you updates on programming and online offerings . Parkinson's Association of the [place]:  [email or web address] o Information on online support groups, education events, and online exercises including Yoga, Parkinson's exercises and more-LOTS of information on links to PD resources and online events o Virtual Support Group through Parkinson's Association of the [place]; next one is scheduled for Wednesday, March 25, 2020 at 2 pm. (These are typically scheduled for the 1st Wednesday of the month at 2 pm).  Visit website for details.  . Additional links for movement activities: o PWR! Moves Classes at [place] RESUMED (but are ON HOLD DUE TO COVID in February)  Contact Amy [place], PT [email or web address] or [phone] if interested o Here is a link to the PWR!Moves classes on Zoom from [phone] - Daily Mon-Sat at 10:00. Via Zoom, FREE and open to all.  There is also a link below via Facebook if you use that platform. - [place] - [email or web address] o  Parkinson's Wellness  Recovery (PWR! Moves)  [email or web address] - Info on the PWR! Virtual Experience:  You will have access to our expertise through self-assessment, guided plans that start with the PD-specific fundamentals, educational content, tips, Q&A with an expert, and a growing Art therapist of PD-specific pre-recorded and live exercise classes of varying types and intensity - both physical and cognitive! If that is not enough, we offer 1:1 wellness consultations (in-person or virtual) to personalize your PWR! Research scientist (medical).  - Check out the PWR! Move of the month on the [place] [facility] website:  [email or web address] o [employer] Fridays:  - As part of the PD Health @ Home program, this free video series focuses each week on one aspect of fitness designed to support people living with Parkinson's.  These weekly videos highlight the [place] recent fitness guidelines for people with Parkinson's disease. -  [email or web address] o Dance for PD website is offering free, live-stream classes throughout the week, as well as links to [employer] of classes:  [email or web address] o Dance for Parkinson's Class:  [place].  Free offering for people with Parkinson's and care partners; virtual class.  o For more information, contact [phone] or email Ruffin Frederick at [email or web address] o Virtual dance and Pilates for Parkinson's classes: Click on the Community Tab> Parkinson's Movement Initiative Tab.  To register for classes and for more information, visit [email or web address] and click the "community" tab.    o [employer] Parkinson's Cycling Classes  - [facility] [employer]: 1pm on Fridays-Live classes at [facility] [employer] at [email or web address] or [phone]) [facility]: Virtual Classes Mondays and Thursdays (contact  [place] at [email or web address] or [phone])   o [place] levels of classes are offered Tuesdays and Thursdays:  10:30 am,  12 noon & 1:45 pm at [facility]. To observe a class or for  more information, call [phone] or email [email or web address] - PD Flow Yoga for Parkinson's:  Fridays 1-2 pm, January 7-March 20, 2020 . [employer]: o Chief Technology Officer Opportunities:  [email or web address]/[email or web address]-group.  You may also contact Vickki Muff at [email or web address] or [phone].   o Virtual Caregiver Retreat, Monday, February 21st, 3-5 pm o Powerful Tools for Caregivers, 6-wk series for caregivers, planning to be in person, starting March 10th o [employer] Navigator:  March 23 program, a free service to help individuals and families through the journey of determining care for older adults.  The "Navigator" is a [employer], Education officer, museum, who will speak with a prospective client and/or loved ones to provide an assessment of the situation and a set of recommendations for a personalized care plan -- all free of charge, and whether [facility] [employer] offers the needed service or not. If the need is not a service we provide, we are well-connected with reputable programs in town that we can refer you to.  [email or web address] or to speak with the Navigator, call [phone].

## 2020-03-25 ENCOUNTER — Other Ambulatory Visit: Payer: Self-pay

## 2020-03-25 ENCOUNTER — Emergency Department (HOSPITAL_COMMUNITY)
Admission: EM | Admit: 2020-03-25 | Discharge: 2020-03-25 | Disposition: A | Discharge status: Home / Self Care | Payer: Medicare Other | Attending: Emergency Medicine | Admitting: Emergency Medicine

## 2020-03-25 ENCOUNTER — Emergency Department (HOSPITAL_COMMUNITY): Payer: Medicare Other

## 2020-03-25 ENCOUNTER — Encounter (HOSPITAL_COMMUNITY): Payer: Self-pay | Admitting: Emergency Medicine

## 2020-03-25 DIAGNOSIS — Z96642 Presence of left artificial hip joint: Secondary | ICD-10-CM | POA: Diagnosis not present

## 2020-03-25 DIAGNOSIS — S5002XA Contusion of left elbow, initial encounter: Secondary | ICD-10-CM | POA: Insufficient documentation

## 2020-03-25 DIAGNOSIS — Y92481 Parking lot as the place of occurrence of the external cause: Secondary | ICD-10-CM | POA: Insufficient documentation

## 2020-03-25 DIAGNOSIS — S0990XA Unspecified injury of head, initial encounter: Secondary | ICD-10-CM | POA: Diagnosis not present

## 2020-03-25 DIAGNOSIS — E039 Hypothyroidism, unspecified: Secondary | ICD-10-CM | POA: Insufficient documentation

## 2020-03-25 DIAGNOSIS — Z7901 Long term (current) use of anticoagulants: Secondary | ICD-10-CM | POA: Diagnosis not present

## 2020-03-25 DIAGNOSIS — W19XXXA Unspecified fall, initial encounter: Secondary | ICD-10-CM | POA: Diagnosis not present

## 2020-03-25 DIAGNOSIS — I10 Essential (primary) hypertension: Secondary | ICD-10-CM | POA: Insufficient documentation

## 2020-03-25 DIAGNOSIS — S61412A Laceration without foreign body of left hand, initial encounter: Secondary | ICD-10-CM | POA: Insufficient documentation

## 2020-03-25 DIAGNOSIS — S32502A Unspecified fracture of left pubis, initial encounter for closed fracture: Secondary | ICD-10-CM | POA: Insufficient documentation

## 2020-03-25 DIAGNOSIS — S32592A Other specified fracture of left pubis, initial encounter for closed fracture: Secondary | ICD-10-CM

## 2020-03-25 DIAGNOSIS — Z950 Presence of cardiac pacemaker: Secondary | ICD-10-CM | POA: Insufficient documentation

## 2020-03-25 DIAGNOSIS — R55 Syncope and collapse: Secondary | ICD-10-CM | POA: Insufficient documentation

## 2020-03-25 DIAGNOSIS — M25552 Pain in left hip: Secondary | ICD-10-CM | POA: Diagnosis not present

## 2020-03-25 DIAGNOSIS — Z79899 Other long term (current) drug therapy: Secondary | ICD-10-CM | POA: Insufficient documentation

## 2020-03-25 DIAGNOSIS — S32512A Fracture of superior rim of left pubis, initial encounter for closed fracture: Secondary | ICD-10-CM | POA: Diagnosis not present

## 2020-03-25 DIAGNOSIS — S79912A Unspecified injury of left hip, initial encounter: Secondary | ICD-10-CM | POA: Diagnosis present

## 2020-03-25 DIAGNOSIS — S51012A Laceration without foreign body of left elbow, initial encounter: Secondary | ICD-10-CM | POA: Diagnosis not present

## 2020-03-25 DIAGNOSIS — G20 Parkinson's disease: Secondary | ICD-10-CM | POA: Diagnosis not present

## 2020-03-25 DIAGNOSIS — M25522 Pain in left elbow: Secondary | ICD-10-CM | POA: Diagnosis not present

## 2020-03-25 LAB — BASIC METABOLIC PANEL
Anion gap: 11 (ref 5–15)
BUN: 25 mg/dL — ABNORMAL HIGH (ref 8–23)
CO2: 24 mmol/L (ref 22–32)
Calcium: 8.9 mg/dL (ref 8.9–10.3)
Chloride: 106 mmol/L (ref 98–111)
Creatinine, Ser: 1.34 mg/dL — ABNORMAL HIGH (ref 0.44–1.00)
GFR, Estimated: 38 mL/min — ABNORMAL LOW (ref >60)
Glucose, Bld: 106 mg/dL — ABNORMAL HIGH (ref 70–99)
Potassium: 3.5 mmol/L (ref 3.5–5.1)
Sodium: 141 mmol/L (ref 135–145)

## 2020-03-25 LAB — URINALYSIS, ROUTINE W REFLEX MICROSCOPIC
Bilirubin Urine: NEGATIVE
Glucose, UA: NEGATIVE mg/dL
Hgb urine dipstick: NEGATIVE
Ketones, ur: 5 mg/dL — AB
Leukocytes,Ua: NEGATIVE
Nitrite: NEGATIVE
Protein, ur: NEGATIVE mg/dL
Specific Gravity, Urine: 1.018 (ref 1.005–1.030)
pH: 5 (ref 5.0–8.0)

## 2020-03-25 LAB — CBC
HCT: 42 % (ref 36.0–46.0)
Hemoglobin: 14.2 g/dL (ref 12.0–15.0)
MCH: 30.2 pg (ref 26.0–34.0)
MCHC: 33.8 g/dL (ref 30.0–36.0)
MCV: 89.4 fL (ref 80.0–100.0)
Platelets: 233 10*3/uL (ref 150–400)
RBC: 4.7 MIL/uL (ref 3.87–5.11)
RDW: 13.2 % (ref 11.5–15.5)
WBC: 13.6 10*3/uL — ABNORMAL HIGH (ref 4.0–10.5)
nRBC: 0 % (ref 0.0–0.2)

## 2020-03-25 LAB — CBG MONITORING, ED: Glucose-Capillary: 90 mg/dL (ref 70–99)

## 2020-03-25 MED ORDER — ONDANSETRON 4 MG PO TBDP: 4.0000 mg | ORAL_TABLET | ORAL | 0 refills | Status: AC | PRN

## 2020-03-25 MED ORDER — OXYCODONE-ACETAMINOPHEN 5-325 MG PO TABS: 1.0000 | ORAL_TABLET | ORAL | 0 refills | Status: AC | PRN

## 2020-03-25 MED ORDER — ONDANSETRON HCL 4 MG/2ML IJ SOLN
4.0000 mg | Freq: Once | INTRAMUSCULAR | Status: AC
  Administered 2020-03-25: 4 mg via INTRAVENOUS
  Filled 2020-03-25: qty 2

## 2020-03-25 MED ORDER — OXYCODONE-ACETAMINOPHEN 5-325 MG PO TABS
1.0000 | ORAL_TABLET | Freq: Once | ORAL | Status: AC
  Administered 2020-03-25: 1 via ORAL
  Filled 2020-03-25: qty 1

## 2020-03-25 MED ORDER — MORPHINE SULFATE (PF) 2 MG/ML IV SOLN
2.0000 mg | Freq: Once | INTRAVENOUS | Status: AC
Start: 2020-03-25 — End: 2020-03-25
  Administered 2020-03-25: 2 mg via INTRAVENOUS
  Filled 2020-03-25: qty 1

## 2020-03-25 NOTE — ED Provider Notes (Signed)
[facility] Provider Note   CSN: [id number] Arrival date & time: 03/25/20  1628     History Chief Complaint  Patient presents with  . Fall    Abigail [facility] is a 87 y.o. female.  HPI Patient has history of Parkinson's disease.  She and her husband had been to [employer] and then they were shopping at the [facility].  She reports once they got inside she started to feel little lightheaded and so went back outside to get in the truck.  She reports she had just gotten to the truck and was reaching for the handle when she got weak and collapsed to the ground.  She reports she did not lose consciousness completely.  She reports she fell and she has a lot of pain in her groin of her pelvis.  She reports she also has pain in the left elbow.  She denies any headache.  She denies any chest pain.  No pain in the knees or lower legs.  She reports however if she moves her legs she will get an intense pain deep into the groin.  Patient does take Eliquis for atrial fibrillation.    Past Medical History:  Diagnosis Date  . Angina at rest, with the tachycardia 07/05/2011  . Aortic insufficiency 07/05/2011  . Atrial fibrillation ([place])   . Brady-tachy syndrome ([place])    Dual chamber Medtronic pacemaker Adapta  . Chronic back pain   . Constipation 12/27/2011  . Constipation   . Gait instability   . Hyperlipidemia   . Hypothyroidism   . Normal cardiac stress test 2013  . OA (osteoarthritis)   . Pacemaker   . Parkinson's disease ([place])   . Tremor     Patient Active Problem List   Diagnosis Date Noted  . Pressure injury of skin 10/05/2018  . Atrial fibrillation with rapid ventricular response ([place]) 10/04/2018  . Chronic anticoagulation   . Syncope   . Anticoagulated   . Epigastric pain   . Diverticulosis   . Melena 11/04/2017  . Atrial fibrillation with RVR ([place]) 09/29/2017  . RBD (REM behavioral disorder) 01/10/2017  . Neurogenic orthostatic  hypotension ([place]) 01/10/2017  . Tachy-brady syndrome ([place]) 06/23/2016  . Closed subcapital fracture of left femur ([place]) 06/23/2016  . Orthostatic hypotension due to Parkinson's disease ([place]) 06/23/2016  . PD (Parkinson's disease) ([place]) 04/21/2016  . Hypokalemia 04/21/2016  . Normal coronary arteries 2001 05/08/2014  . Pacemaker 08/10/2012  . Aortic insufficiency 08/10/2012  . Constipation 12/27/2011  . Family hx of colon cancer 12/27/2011  . HTN (hypertension) 07/06/2011  . Hypothyroidism 07/05/2011  . Paroxysmal atrial fibrillation ([place]) 07/05/2011  . Sinus pause, 7 seconds post conversion, S/P MDT pacemaker 07/05/11 07/05/2011  . Dyslipidemia 07/05/2011  . Angina at rest, with rapid AF 07/05/2011    Past Surgical History:  Procedure Laterality Date  . ABDOMINAL HYSTERECTOMY    . CARDIAC CATHETERIZATION  05/26/1999   normal  . CARDIAC CATHETERIZATION  2001   normal coronary arteries  . cataract    . COLONOSCOPY N/A 11/15/2012   Procedure: COLONOSCOPY;  Surgeon: [name], MD;  [facility];  Service: Endoscopy;  Laterality: N/A;  1030  . ESOPHAGOGASTRODUODENOSCOPY (EGD) WITH PROPOFOL N/A 11/06/2017   Procedure: ESOPHAGOGASTRODUODENOSCOPY (EGD) WITH PROPOFOL;  Surgeon: [name], MD;  [facility];  Service: Endoscopy;  Laterality: N/A;  . HIP ARTHROPLASTY Left 06/24/2016   Procedure: ARTHROPLASTY BIPOLAR HIP (HEMIARTHROPLASTY);  Surgeon:  Altamese Gwinn, MD;  Location: [place];  Service: Orthopedics;  Laterality: Left;  . INSERT / REPLACE / REMOVE PACEMAKER    . NM MYOVIEW LTD  07/19/2011   normal  . PERMANENT PACEMAKER INSERTION  07/05/2011   Medtronic Adapta dual chamber  . PERMANENT PACEMAKER INSERTION N/A 07/05/2011   Procedure: PERMANENT PACEMAKER INSERTION;  Surgeon: [name], MD;  Location: [facility];  Service: Cardiovascular;  Laterality: N/A;     OB History   No obstetric history on file.     Family History  Problem Relation Age  of Onset  . Colon cancer Mother 63  . Heart attack Mother   . Cancer Father   . Kidney cancer Sister   . Healthy Brother   . Healthy Son   . Healthy Daughter   . Suicidality Son     Social History   Tobacco Use  . Smoking status: Never Smoker  . Smokeless tobacco: Never Used  Vaping Use  . Vaping Use: Never used  Substance Use Topics  . Alcohol use: No    Alcohol/week: 0.0 standard drinks  . Drug use: No    Home Medications Prior to Admission medications   Medication Sig Start Date End Date Taking? Authorizing Provider  ondansetron (ZOFRAN ODT) 4 MG disintegrating tablet Take 1 tablet (4 mg total) by mouth every 4 (four) hours as needed for nausea or vomiting. 03/25/20  Yes Charlesetta Shanks, MD  oxyCODONE-acetaminophen (PERCOCET) 5-325 MG tablet Take 1-2 tablets by mouth every 4 (four) hours as needed. 03/25/20  Yes Charlesetta Shanks, MD  acetaminophen (TYLENOL) 500 MG tablet Take 1,000 mg by mouth every 6 (six) hours as needed for mild pain.    [provider]  amiodarone (PACERONE) 200 MG tablet TAKE 1 TABLET (200 MG TOTAL) BY MOUTH EVERY OTHER DAY. 11/14/19   Croitoru, Mihai, MD  Carbidopa-Levodopa ER (SINEMET CR) 25-100 MG tablet controlled release 2 at 8am, 1 at noon, 1 at 4pm 03/24/20   Tat, Rebecca S, DO  cycloSPORINE (RESTASIS) 0.05 % ophthalmic emulsion Place 1 drop into the left eye every evening.    [provider]  doxycycline (VIBRA-TABS) 100 MG tablet Take 100 mg by mouth 2 (two) times daily. 10/10/18   [provider]  ELIQUIS 2.5 MG TABS tablet TAKE 1 TABLET BY MOUTH TWICE A DAY 02/24/20   Croitoru, Mihai, MD  fludrocortisone (FLORINEF) 0.1 MG tablet Take 1 tablet (100 mcg total) by mouth 4 (four) times a week. 10/17/19   Croitoru, Mihai, MD  levothyroxine (SYNTHROID, LEVOTHROID) 75 MCG tablet Take 75 mcg by mouth daily before breakfast.    [provider]  linaclotide (LINZESS) 145 MCG CAPS capsule Take 1 capsule by mouth daily. Only takes  as needed    [provider]  Magnesium Hydroxide (DULCOLAX PO) Take 2 tablets by mouth daily as needed.    [provider]  polyethylene glycol (MIRALAX / GLYCOLAX) 17 g packet Take 17 g by mouth daily.    [provider]  rosuvastatin (CRESTOR) 10 MG tablet Take 10 mg by mouth at bedtime. Reported on 07/09/2015    [provider]  XIIDRA 5 % SOLN Place 1 drop into the left eye at bedtime. 10/04/16   [provider]    Allergies    Patient has no known allergies.  Review of Systems   Review of Systems 10 Systems reviewed and negative except as per HPI Physical Exam Updated Vital Signs BP 134/61   Pulse  65   Temp 97.7 F (36.5 C) (Oral)   Resp 13   SpO2 99%   Physical Exam Constitutional:      Comments: Alert and nontoxic.  Mental status clear.  No respiratory distress.  Good physical condition for age  HENT:     Head: Normocephalic and atraumatic.     Mouth/Throat:     Pharynx: Oropharynx is clear.  Eyes:     Extraocular Movements: Extraocular movements intact.     Pupils: Pupils are equal, round, and reactive to light.  Cardiovascular:     Rate and Rhythm: Normal rate and regular rhythm.     Heart sounds: Normal heart sounds.  Pulmonary:     Effort: Pulmonary effort is normal.     Breath sounds: Normal breath sounds.  Chest:     Chest wall: No tenderness.  Abdominal:     General: There is no distension.     Palpations: Abdomen is soft.     Tenderness: There is no abdominal tenderness. There is no guarding.  Musculoskeletal:     Comments: Pain with any range of motion that causes flexion extension at the hips.  This radiates a deep pain into the groin.  Inspection of the groin however shows no mass no fullness there is no contusions or abrasions to the pelvis or the hips.  Lower extremities are without any deformities, effusions or abrasions.  No pain to palpation of the thighs knees lower legs feet or ankles.  Patient does have  a contusion to the left elbow.  No effusion of the joint.  Some very minor skin tears of the elbow.  Range of motion of the elbow intact.  Patient has a skin tear over the left dorsal surface of the hand.  This is crescent-shaped and approximately 5 cm.  No active bleeding.  Skin:    General: Skin is warm and dry.  Neurological:     General: No focal deficit present.     Motor: No weakness.     Coordination: Coordination normal.     Comments: Patient is alert and appropriate.  She is answering questions appropriate she follows commands appropriately.  Patient does have some tremor associated with her Parkinson's.     ED Results / Procedures / Treatments   Labs (all labs ordered are listed, but only abnormal results are displayed) Labs Reviewed  BASIC METABOLIC PANEL - Abnormal; Notable for the following components:      Result Value   Glucose, Bld 106 (*)    BUN 25 (*)    Creatinine, Ser 1.34 (*)    GFR, Estimated 38 (*)    All other components within normal limits  CBC - Abnormal; Notable for the following components:   WBC 13.6 (*)    All other components within normal limits  URINALYSIS, ROUTINE W REFLEX MICROSCOPIC - Abnormal; Notable for the following components:   Ketones, ur 5 (*)    All other components within normal limits  CBG MONITORING, ED    EKG EKG Interpretation  Date/Time:  Wednesday March 25 2020 16:40:02 EST Ventricular Rate:  62 PR Interval:  208 QRS Duration: 140 QT Interval:  500 QTC Calculation: 507 R Axis:   72 Text Interpretation: Atrial-paced rhythm Right bundle branch block Abnormal ECG agree, old RBBB Confirmed by Charlesetta Shanks [phone]) on 03/25/2020 6:56:00 PM   Radiology CT ABDOMEN PELVIS WO CONTRAST  Result Date: 03/25/2020 CLINICAL DATA:  Abdominal trauma. pt had syncopal episode in the [employer] parking  lot. Pt c/o left hip pain, skin tear to left hand, abrasion to left elbow. Pt states she takes eliquis for afib. EXAM: CT ABDOMEN AND  PELVIS WITHOUT CONTRAST TECHNIQUE: Multidetector CT imaging of the abdomen and pelvis was performed following the standard protocol without IV contrast. COMPARISON:  CT abdomen pelvis 11/04/2017, CT lumbar spine 08/16/2018, CT abdomen pelvis 03/01/2020 FINDINGS: Lower chest: Bilateral subsegmental atelectasis. Partially visualized 2 lead cardiac pacemaker in grossly appropriate position. Hepatobiliary: No focal liver abnormality. Layering hyperdensity within the gallbladder lumen likely represents gallstones. No gallbladder wall thickening or pericholecystic fluid. No biliary dilatation. Pancreas: Diffusely atrophic. No focal lesion. Otherwise normal pancreatic contour. No surrounding inflammatory changes. No main pancreatic ductal dilatation. Spleen: Normal in size without focal abnormality. Adrenals/Urinary Tract: No adrenal nodule bilaterally. Punctate density within the left kidney. No hydronephrosis and no contour-deforming renal mass. No ureterolithiasis or hydroureter. The urinary bladder is unremarkable. Limited evaluation of the urinary bladder due to streak artifact originating from the left femoral surgical hardware. Stomach/Bowel: Stomach is within normal limits. No evidence of bowel wall thickening or dilatation. Scattered colonic diverticulosis. Limited evaluation of the rectum due to streak artifact originating from the left femoral surgical hardware. The appendix is not definitely identified. Vascular/Lymphatic: No abdominal aorta or iliac aneurysm. Severe atherosclerotic plaque of the aorta and its branches. No abdominal, pelvic, or inguinal lymphadenopathy. Reproductive: Likely status post hysterectomy but limited evaluation due to streak artifact originating from the left surgical hardware. Other: No intraperitoneal free fluid. No intraperitoneal free gas. No organized fluid collection. Musculoskeletal: No abdominal wall hernia or abnormality No suspicious lytic or blastic osseous lesions. Total  left hip arthroplasty is partially visualized. Acute displaced fracture of the left inferior pubic rami that is a full shaft width displaced (3:83) with an associated posterior left inferior pubic rami nondisplaced fracture (3:86). Associated nondisplaced fracture of the lateral superior pubic rami/acetabula (6:50, 3:76). Multilevel severe degenerative changes of the spine. Slight levocurvature of the lower lumbar spine. Similar-appearing L1 compression fracture with greater than 95% height loss centrally and persistent 9 mm retropulsion into the central canal. No pelvic diastasis. Redemonstration of old left L3 transverse process fracture. IMPRESSION: 1. Interval development of a left lateral superior/acetabula and inferior pubic rami fractures (inferior pubic rami displaced and fracture in 2 places) in a patient status post total left hip arthroplasty that is only partially visualized. 2. Otherwise no acute intraabdominal or intrapelvic abnormality; however, limited evaluation of the pelvis including the rectum and urinary bladder due to streak artifact originating from the left femoral surgical hardware as well as no use of intravenous contrast. 3. Similar-appearing L1 compression fracture with greater than 95% height loss centrally and interval development of 9 mm retropulsion into the central canal. Consider MRI lumbar spine for further evaluation of the spinal cord if clinically indicated. Electronically Signed   By: Iven Finn M.D.   On: 03/25/2020 20:06   DG Chest 1 View  Result Date: 03/25/2020 CLINICAL DATA:  Left elbow pain after fall EXAM: CHEST  1 VIEW COMPARISON:  08/30/2019 FINDINGS: Single frontal view of the chest demonstrates dual lead pacer overlying left chest unchanged. Cardiac silhouette is unremarkable. No acute airspace disease, effusion, or pneumothorax. Stable focal eventration right hemidiaphragm. No acute bony abnormalities. IMPRESSION: 1. No acute intrathoracic process.  Electronically Signed   By: Randa Ngo M.D.   On: 03/25/2020 20:34   DG Elbow Complete Left  Result Date: 03/25/2020 CLINICAL DATA:  Left elbow pain after fall EXAM: LEFT  ELBOW - COMPLETE 3+ VIEW COMPARISON:  None. FINDINGS: Frontal, bilateral oblique, and lateral views of the left elbow are obtained. The bones are diffusely osteopenic. No acute fracture, subluxation, or dislocation. Joint spaces are well preserved. Soft tissues are unremarkable. IMPRESSION: 1. No acute displaced fracture Electronically Signed   By: Randa Ngo M.D.   On: 03/25/2020 20:33   CT Head Wo Contrast  Result Date: 03/25/2020 CLINICAL DATA:  Head trauma, syncopal episode. EXAM: CT HEAD WITHOUT CONTRAST TECHNIQUE: Contiguous axial images were obtained from the base of the skull through the vertex without intravenous contrast. COMPARISON:  Head CT dated 10/04/2018. FINDINGS: Brain: Ventricles are normal in size and configuration. There is no mass, hemorrhage, edema or other evidence of acute parenchymal abnormality. No extra-axial hemorrhage. Vascular: No hyperdense vessel or unexpected calcification. Skull: Unremarkable.  Negative for fracture or focal lesion. Sinuses/Orbits: No acute finding. Other: None. IMPRESSION: Negative head CT. No intracranial mass, hemorrhage or edema. No skull fracture. Electronically Signed   By: Franki Cabot M.D.   On: 03/25/2020 19:55   DG Hip Unilat With Pelvis 2-3 Views Left  Result Date: 03/25/2020 CLINICAL DATA:  Fall, LEFT hip pain after fall today. History of LEFT hip replacement. EXAM: DG HIP (WITH OR WITHOUT PELVIS) 2-3V LEFT COMPARISON:  None. FINDINGS: LEFT hip arthroplasty hardware appears intact and appropriately positioned. Probable slightly displaced fractures of the LEFT inferior pubic ramus and at the lateral aspect of the LEFT superior pubic ramus. Degenerative spondylosis of the scoliotic lower lumbar spine, moderate in degree. Osseous structures of the RIGHT hemipelvis appear  intact and normally aligned. IMPRESSION: 1. Probable slightly displaced fractures of the LEFT inferior pubic ramus and at the lateral aspect of the LEFT superior pubic ramus. 2. LEFT hip arthroplasty hardware appears intact and appropriately positioned. Electronically Signed   By: Franki Cabot M.D.   On: 03/25/2020 17:18    Procedures Procedures   Medications Ordered in ED Medications  oxyCODONE-acetaminophen (PERCOCET/ROXICET) 5-325 MG per tablet 1 tablet (has no administration in time range)  morphine 2 MG/ML injection 2 mg (2 mg Intravenous Given 03/25/20 1841)  ondansetron (ZOFRAN) injection 4 mg (4 mg Intravenous Given 03/25/20 1839)    ED Course  I have reviewed the triage vital signs and the nursing notes.  Pertinent labs & imaging results that were available during my care of the patient were reviewed by me and considered in my medical decision making (see chart for details).    MDM Rules/Calculators/A&P                         Consult: Reviewed with Dr. Marcelino Scot of orthopedics.  He advises at this time patient is able to be weightbearing.  She may use a walker for stability as needed.  He will see her in the office on outpatient follow-up  Patient had a near syncope/syncopal event.  No preceding chest pain, headache or palpitations.  Patient has some general lightheadedness while shopping.  EKG is paced with otherwise unchanged right bundle branch block.  Patient's rhythm has been stable throughout her stay in the emergency [facility].  Blood pressures are stable.  She has not experienced any chest pain.  At this time I have lower suspicion for cardiac etiology for the near syncope.  With slightly prolonged lightheadedness followed by syncope I suspect vasovagal or orthostatic hypotension.  Patient does have Parkinson's disease and possibly some autonomic instability.  Her mental status has remained clear, CT shows  no acute injury, patient denies striking her head, no focal neurologic  deficit.  Patient does have Parkinson's with some baseline tremor.  At this time no signs of acute infectious symptoms.  Plan will be for discharge with pain control and a walker.  Return precautions are reviewed.  Follow-up plan in place.  Patient and her husband felt comfortable going home.  They report her daughter also lives nearby for assistance  Final Clinical Impression(s) / ED Diagnoses Final diagnoses:  Syncope, unspecified syncope type  Pubic ramus fracture, left, closed, initial encounter ([place])  Skin tear of left hand without complication, initial encounter  Contusion of left elbow, initial encounter    Rx / DC Orders ED Discharge Orders         Ordered    oxyCODONE-acetaminophen (PERCOCET) 5-325 MG tablet  Every 4 hours PRN        03/25/20 2246    ondansetron (ZOFRAN ODT) 4 MG disintegrating tablet  Every 4 hours PRN        03/25/20 2246           Charlesetta Shanks, MD 03/25/20 2250

## 2020-03-25 NOTE — ED Notes (Signed)
Pt given PO med with graham crackers

## 2020-03-25 NOTE — ED Notes (Signed)
Patient verbalizes understanding of discharge instructions. Opportunity for questioning and answers were provided. Armband removed by staff, pt discharged from ED via wheelchair.  

## 2020-03-25 NOTE — ED Triage Notes (Signed)
Pt arrives POV with her husband, pt had syncopal episode in the [place] parking lot. Pt c/o left hip pain, skin tear to left hand, abrasion to left elbow. Pt states she takes eliquis for afib.

## 2020-03-25 NOTE — Discharge Instructions (Addendum)
1.  Leave your dressings in place over your skin tear for the next 48 hours.  You may change the outer dressings if they become damp or soiled.  Try to leave the Steri-Strips on your hand in place until they fall off on their own.  Once these have come off, you may gently clean and dry the wound with mild soap and water.  You can continue to apply antibiotic ointment until completely healed. 2.  You have a fracture of your pelvis.  You are permitted to walk with a walker.  You may take 1 Percocet every 4-6 hours for pain.  May also take a Tylenol tablet with the Percocet.  Percocet can cause constipation.  If you have any tendency to constipation, take an over-the-counter stool softener such as Colace twice daily. 3.  Call Dr. Carlean Jews office and schedule your follow-up appointment.  Call your family doctor and schedule a recheck within the next 2 to 4 days. 4.  Return to the emergency department if you cannot manage pain with medications, you are having worsening or changing symptoms.

## 2020-03-30 DIAGNOSIS — S61412A Laceration without foreign body of left hand, initial encounter: Secondary | ICD-10-CM | POA: Diagnosis not present

## 2020-03-30 DIAGNOSIS — S329XXA Fracture of unspecified parts of lumbosacral spine and pelvis, initial encounter for closed fracture: Secondary | ICD-10-CM | POA: Diagnosis not present

## 2020-03-30 DIAGNOSIS — S51012A Laceration without foreign body of left elbow, initial encounter: Secondary | ICD-10-CM | POA: Diagnosis not present

## 2020-04-01 DIAGNOSIS — I10 Essential (primary) hypertension: Secondary | ICD-10-CM | POA: Diagnosis not present

## 2020-04-01 DIAGNOSIS — R531 Weakness: Secondary | ICD-10-CM | POA: Diagnosis not present

## 2020-04-01 DIAGNOSIS — G20 Parkinson's disease: Secondary | ICD-10-CM | POA: Diagnosis not present

## 2020-04-01 DIAGNOSIS — R2681 Unsteadiness on feet: Secondary | ICD-10-CM | POA: Diagnosis not present

## 2020-04-01 DIAGNOSIS — I4891 Unspecified atrial fibrillation: Secondary | ICD-10-CM | POA: Diagnosis not present

## 2020-04-01 DIAGNOSIS — S61412D Laceration without foreign body of left hand, subsequent encounter: Secondary | ICD-10-CM | POA: Diagnosis not present

## 2020-04-01 DIAGNOSIS — S51012D Laceration without foreign body of left elbow, subsequent encounter: Secondary | ICD-10-CM | POA: Diagnosis not present

## 2020-04-01 DIAGNOSIS — S3289XD Fracture of other parts of pelvis, subsequent encounter for fracture with routine healing: Secondary | ICD-10-CM | POA: Diagnosis not present

## 2020-04-02 DIAGNOSIS — G20 Parkinson's disease: Secondary | ICD-10-CM | POA: Diagnosis not present

## 2020-04-02 DIAGNOSIS — S51012D Laceration without foreign body of left elbow, subsequent encounter: Secondary | ICD-10-CM | POA: Diagnosis not present

## 2020-04-02 DIAGNOSIS — I10 Essential (primary) hypertension: Secondary | ICD-10-CM | POA: Diagnosis not present

## 2020-04-02 DIAGNOSIS — I4891 Unspecified atrial fibrillation: Secondary | ICD-10-CM | POA: Diagnosis not present

## 2020-04-02 DIAGNOSIS — S3289XD Fracture of other parts of pelvis, subsequent encounter for fracture with routine healing: Secondary | ICD-10-CM | POA: Diagnosis not present

## 2020-04-02 DIAGNOSIS — S61412D Laceration without foreign body of left hand, subsequent encounter: Secondary | ICD-10-CM | POA: Diagnosis not present

## 2020-04-03 DIAGNOSIS — I10 Essential (primary) hypertension: Secondary | ICD-10-CM | POA: Diagnosis not present

## 2020-04-03 DIAGNOSIS — S51012D Laceration without foreign body of left elbow, subsequent encounter: Secondary | ICD-10-CM | POA: Diagnosis not present

## 2020-04-03 DIAGNOSIS — G20 Parkinson's disease: Secondary | ICD-10-CM | POA: Diagnosis not present

## 2020-04-03 DIAGNOSIS — S3289XD Fracture of other parts of pelvis, subsequent encounter for fracture with routine healing: Secondary | ICD-10-CM | POA: Diagnosis not present

## 2020-04-03 DIAGNOSIS — I4891 Unspecified atrial fibrillation: Secondary | ICD-10-CM | POA: Diagnosis not present

## 2020-04-03 DIAGNOSIS — S61412D Laceration without foreign body of left hand, subsequent encounter: Secondary | ICD-10-CM | POA: Diagnosis not present

## 2020-04-06 ENCOUNTER — Telehealth: Payer: Self-pay | Admitting: Cardiovascular Disease

## 2020-04-06 DIAGNOSIS — S61412D Laceration without foreign body of left hand, subsequent encounter: Secondary | ICD-10-CM | POA: Diagnosis not present

## 2020-04-06 DIAGNOSIS — S51012D Laceration without foreign body of left elbow, subsequent encounter: Secondary | ICD-10-CM | POA: Diagnosis not present

## 2020-04-06 DIAGNOSIS — S3289XD Fracture of other parts of pelvis, subsequent encounter for fracture with routine healing: Secondary | ICD-10-CM | POA: Diagnosis not present

## 2020-04-06 DIAGNOSIS — G20 Parkinson's disease: Secondary | ICD-10-CM | POA: Diagnosis not present

## 2020-04-06 DIAGNOSIS — I4891 Unspecified atrial fibrillation: Secondary | ICD-10-CM | POA: Diagnosis not present

## 2020-04-06 DIAGNOSIS — I10 Essential (primary) hypertension: Secondary | ICD-10-CM | POA: Diagnosis not present

## 2020-04-06 MED ORDER — FLUDROCORTISONE ACETATE 0.1 MG PO TABS: 100.0000 ug | ORAL_TABLET | ORAL | 3 refills | Status: DC

## 2020-04-06 NOTE — Telephone Encounter (Signed)
Refill has been sent in.  

## 2020-04-06 NOTE — Telephone Encounter (Signed)
*  STAT* If patient is at the pharmacy, call can be transferred to refill team.   1. Which medications need to be refilled? (please list name of each medication and dose if known)   fludrocortisone (FLORINEF) 0.1 MG tablet [[id number]]  2. Which pharmacy/location (including street and city if local pharmacy) is medication to be sent to? [id number] [place], [place] [address] [facility] [address] [facility] [address]  [place], [place] [address]  Phone:  [phone] Fax:  [phone]   3. Do they need a 30 day or 90 day supply? 90  Pt is completely out of this med.

## 2020-04-07 DIAGNOSIS — S51012D Laceration without foreign body of left elbow, subsequent encounter: Secondary | ICD-10-CM | POA: Diagnosis not present

## 2020-04-07 DIAGNOSIS — I10 Essential (primary) hypertension: Secondary | ICD-10-CM | POA: Diagnosis not present

## 2020-04-07 DIAGNOSIS — S61412D Laceration without foreign body of left hand, subsequent encounter: Secondary | ICD-10-CM | POA: Diagnosis not present

## 2020-04-07 DIAGNOSIS — G20 Parkinson's disease: Secondary | ICD-10-CM | POA: Diagnosis not present

## 2020-04-07 DIAGNOSIS — S3289XD Fracture of other parts of pelvis, subsequent encounter for fracture with routine healing: Secondary | ICD-10-CM | POA: Diagnosis not present

## 2020-04-07 DIAGNOSIS — I4891 Unspecified atrial fibrillation: Secondary | ICD-10-CM | POA: Diagnosis not present

## 2020-04-08 ENCOUNTER — Telehealth: Payer: Self-pay | Admitting: Cardiovascular Disease

## 2020-04-08 DIAGNOSIS — I10 Essential (primary) hypertension: Secondary | ICD-10-CM | POA: Diagnosis not present

## 2020-04-08 DIAGNOSIS — S3289XD Fracture of other parts of pelvis, subsequent encounter for fracture with routine healing: Secondary | ICD-10-CM | POA: Diagnosis not present

## 2020-04-08 DIAGNOSIS — I4891 Unspecified atrial fibrillation: Secondary | ICD-10-CM | POA: Diagnosis not present

## 2020-04-08 DIAGNOSIS — S61412D Laceration without foreign body of left hand, subsequent encounter: Secondary | ICD-10-CM | POA: Diagnosis not present

## 2020-04-08 DIAGNOSIS — G20 Parkinson's disease: Secondary | ICD-10-CM | POA: Diagnosis not present

## 2020-04-08 DIAGNOSIS — S51012D Laceration without foreign body of left elbow, subsequent encounter: Secondary | ICD-10-CM | POA: Diagnosis not present

## 2020-04-08 NOTE — Telephone Encounter (Signed)
Pt c/o BP issue: STAT if pt c/o blurred vision, one-sided weakness or slurred speech  1. What are your last 5 BP readings? 130/62; 61; 80/44; 56; 110/42; 58  2. Are you having any other symptoms (ex. Dizziness, headache, blurred vision, passed out)? All of the above  3. What is your BP issue? Really low BP

## 2020-04-08 NOTE — Telephone Encounter (Signed)
Katie notified for pt  to take Gen Florinef 0.1 mg every day . Katie to notify pt and pt's husband of med change ./cy

## 2020-04-08 NOTE — Telephone Encounter (Signed)
If the fludrocortisone was interrupted during her hospital stay, it should be restarted. If she has been receiving the fludrocortisone 4 days a week, please increase the fludrocortisone 0.1 mg to every day.  Either way, it will take several days (up to 2 weeks) for the effect to fully kick in.

## 2020-04-08 NOTE — Telephone Encounter (Signed)
Spoke with Trudee Grip [facility] and also could hear spouse in back ground Pt complaining of dizziness,blurrred vision, and feeling like could pass out Pt has had low B/P noted today was 80/44 after walking 40 feet and other occasions  on 04/03/20  71/45 after transfer and on 04/06/20 82/40 after transfer as well.Pt fell and suffered  pelvic fx from syncopal episode on March 25 2020 Will froward to Dr Sallyanne Kuster for review and recommendations ./cy

## 2020-04-09 DIAGNOSIS — S61412D Laceration without foreign body of left hand, subsequent encounter: Secondary | ICD-10-CM | POA: Diagnosis not present

## 2020-04-09 DIAGNOSIS — G20 Parkinson's disease: Secondary | ICD-10-CM | POA: Diagnosis not present

## 2020-04-09 DIAGNOSIS — S51012D Laceration without foreign body of left elbow, subsequent encounter: Secondary | ICD-10-CM | POA: Diagnosis not present

## 2020-04-09 DIAGNOSIS — S3289XD Fracture of other parts of pelvis, subsequent encounter for fracture with routine healing: Secondary | ICD-10-CM | POA: Diagnosis not present

## 2020-04-10 DIAGNOSIS — S3289XD Fracture of other parts of pelvis, subsequent encounter for fracture with routine healing: Secondary | ICD-10-CM | POA: Diagnosis not present

## 2020-04-10 DIAGNOSIS — S61412D Laceration without foreign body of left hand, subsequent encounter: Secondary | ICD-10-CM | POA: Diagnosis not present

## 2020-04-10 DIAGNOSIS — S51012D Laceration without foreign body of left elbow, subsequent encounter: Secondary | ICD-10-CM | POA: Diagnosis not present

## 2020-04-10 DIAGNOSIS — I10 Essential (primary) hypertension: Secondary | ICD-10-CM | POA: Diagnosis not present

## 2020-04-10 DIAGNOSIS — G20 Parkinson's disease: Secondary | ICD-10-CM | POA: Diagnosis not present

## 2020-04-10 DIAGNOSIS — I4891 Unspecified atrial fibrillation: Secondary | ICD-10-CM | POA: Diagnosis not present

## 2020-04-13 ENCOUNTER — Telehealth: Payer: Self-pay | Admitting: Cardiovascular Disease

## 2020-04-13 DIAGNOSIS — G20 Parkinson's disease: Secondary | ICD-10-CM | POA: Diagnosis not present

## 2020-04-13 DIAGNOSIS — I10 Essential (primary) hypertension: Secondary | ICD-10-CM | POA: Diagnosis not present

## 2020-04-13 DIAGNOSIS — S61412D Laceration without foreign body of left hand, subsequent encounter: Secondary | ICD-10-CM | POA: Diagnosis not present

## 2020-04-13 DIAGNOSIS — S51012D Laceration without foreign body of left elbow, subsequent encounter: Secondary | ICD-10-CM | POA: Diagnosis not present

## 2020-04-13 DIAGNOSIS — I4891 Unspecified atrial fibrillation: Secondary | ICD-10-CM | POA: Diagnosis not present

## 2020-04-13 DIAGNOSIS — S3289XD Fracture of other parts of pelvis, subsequent encounter for fracture with routine healing: Secondary | ICD-10-CM | POA: Diagnosis not present

## 2020-04-13 MED ORDER — MIDODRINE HCL 2.5 MG PO TABS: ORAL_TABLET | ORAL | 3 refills | Status: DC

## 2020-04-13 MED ORDER — FLUDROCORTISONE ACETATE 0.1 MG PO TABS: 100.0000 ug | ORAL_TABLET | Freq: Every day | ORAL | 3 refills | Status: DC

## 2020-04-13 NOTE — Telephone Encounter (Signed)
Cannot increase fludrocortisone further due to resting supine HTN. Please continue fludrocortisone current dose and add midodrine 2.5 mg every AM (as soon as she wakes up, preferably before even getting out of bed) and can take a second dose 4 hours later if needed (but she should not lie flat for 4 hours after dosing midodrine).

## 2020-04-13 NOTE — Telephone Encounter (Signed)
The patient's husband and Kathlee Nations have been made aware. Medications have been updated. They will call back if this does not help.

## 2020-04-13 NOTE — Telephone Encounter (Signed)
170/72 - laying 138/68 - sitting 82/42 - standing  Wants to do therapy but continues to display lightheadedness and syncope.   Takes fludrocortisone to retain sodium  Please call to discuss

## 2020-04-13 NOTE — Telephone Encounter (Signed)
Spoke with Kathlee Nations the [facility]. She stated that the patient had been taking the Florienf daily for months now and now 4 times a week as prescribed.   She is still having low blood pressures when standing which is prohibiting her from doing physical therapy.   Blood pressures below are typical for her:  170/72 - laying 138/68 - sitting 82/42 - standing

## 2020-04-14 DIAGNOSIS — S61412D Laceration without foreign body of left hand, subsequent encounter: Secondary | ICD-10-CM | POA: Diagnosis not present

## 2020-04-14 DIAGNOSIS — S51012D Laceration without foreign body of left elbow, subsequent encounter: Secondary | ICD-10-CM | POA: Diagnosis not present

## 2020-04-14 DIAGNOSIS — I10 Essential (primary) hypertension: Secondary | ICD-10-CM | POA: Diagnosis not present

## 2020-04-14 DIAGNOSIS — S3289XD Fracture of other parts of pelvis, subsequent encounter for fracture with routine healing: Secondary | ICD-10-CM | POA: Diagnosis not present

## 2020-04-14 DIAGNOSIS — G20 Parkinson's disease: Secondary | ICD-10-CM | POA: Diagnosis not present

## 2020-04-14 DIAGNOSIS — I4891 Unspecified atrial fibrillation: Secondary | ICD-10-CM | POA: Diagnosis not present

## 2020-04-15 DIAGNOSIS — S3289XD Fracture of other parts of pelvis, subsequent encounter for fracture with routine healing: Secondary | ICD-10-CM | POA: Diagnosis not present

## 2020-04-15 DIAGNOSIS — I10 Essential (primary) hypertension: Secondary | ICD-10-CM | POA: Diagnosis not present

## 2020-04-15 DIAGNOSIS — S61412D Laceration without foreign body of left hand, subsequent encounter: Secondary | ICD-10-CM | POA: Diagnosis not present

## 2020-04-15 DIAGNOSIS — I4891 Unspecified atrial fibrillation: Secondary | ICD-10-CM | POA: Diagnosis not present

## 2020-04-15 DIAGNOSIS — S51012D Laceration without foreign body of left elbow, subsequent encounter: Secondary | ICD-10-CM | POA: Diagnosis not present

## 2020-04-15 DIAGNOSIS — G20 Parkinson's disease: Secondary | ICD-10-CM | POA: Diagnosis not present

## 2020-04-16 ENCOUNTER — Ambulatory Visit (INDEPENDENT_AMBULATORY_CARE_PROVIDER_SITE_OTHER): Payer: Medicare Other

## 2020-04-16 DIAGNOSIS — G20 Parkinson's disease: Secondary | ICD-10-CM | POA: Diagnosis not present

## 2020-04-16 DIAGNOSIS — I10 Essential (primary) hypertension: Secondary | ICD-10-CM | POA: Diagnosis not present

## 2020-04-16 DIAGNOSIS — S61412D Laceration without foreign body of left hand, subsequent encounter: Secondary | ICD-10-CM | POA: Diagnosis not present

## 2020-04-16 DIAGNOSIS — S3289XD Fracture of other parts of pelvis, subsequent encounter for fracture with routine healing: Secondary | ICD-10-CM | POA: Diagnosis not present

## 2020-04-16 DIAGNOSIS — S51012D Laceration without foreign body of left elbow, subsequent encounter: Secondary | ICD-10-CM | POA: Diagnosis not present

## 2020-04-16 DIAGNOSIS — I4891 Unspecified atrial fibrillation: Secondary | ICD-10-CM | POA: Diagnosis not present

## 2020-04-16 DIAGNOSIS — I495 Sick sinus syndrome: Secondary | ICD-10-CM

## 2020-04-17 DIAGNOSIS — I4891 Unspecified atrial fibrillation: Secondary | ICD-10-CM | POA: Diagnosis not present

## 2020-04-17 DIAGNOSIS — G20 Parkinson's disease: Secondary | ICD-10-CM | POA: Diagnosis not present

## 2020-04-17 DIAGNOSIS — S51012D Laceration without foreign body of left elbow, subsequent encounter: Secondary | ICD-10-CM | POA: Diagnosis not present

## 2020-04-17 DIAGNOSIS — S3289XD Fracture of other parts of pelvis, subsequent encounter for fracture with routine healing: Secondary | ICD-10-CM | POA: Diagnosis not present

## 2020-04-17 DIAGNOSIS — S61412D Laceration without foreign body of left hand, subsequent encounter: Secondary | ICD-10-CM | POA: Diagnosis not present

## 2020-04-17 DIAGNOSIS — I10 Essential (primary) hypertension: Secondary | ICD-10-CM | POA: Diagnosis not present

## 2020-04-18 LAB — CUP PACEART REMOTE DEVICE CHECK
Battery Impedance: 934 Ohm
Battery Remaining Longevity: 74 mo
Battery Voltage: 2.78 V
Brady Statistic AP VP Percent: 0 %
Brady Statistic AP VS Percent: 83 %
Brady Statistic AS VP Percent: 0 %
Brady Statistic AS VS Percent: 17 %
Date Time Interrogation Session: 20220324112518
Implantable Lead Implant Date: 20130611
Implantable Lead Implant Date: 20130611
Implantable Lead Location: 753859
Implantable Lead Location: 753860
Implantable Lead Model: 5076
Implantable Lead Model: 5076
Implantable Pulse Generator Implant Date: 20130611
Lead Channel Impedance Value: 424 Ohm
Lead Channel Impedance Value: 621 Ohm
Lead Channel Pacing Threshold Amplitude: 0.625 V
Lead Channel Pacing Threshold Amplitude: 0.875 V
Lead Channel Pacing Threshold Pulse Width: 0.4 ms
Lead Channel Pacing Threshold Pulse Width: 0.4 ms
Lead Channel Setting Pacing Amplitude: 1.5 V
Lead Channel Setting Pacing Amplitude: 2 V
Lead Channel Setting Pacing Pulse Width: 0.4 ms
Lead Channel Setting Sensing Sensitivity: 5.6 mV

## 2020-04-20 DIAGNOSIS — S51012D Laceration without foreign body of left elbow, subsequent encounter: Secondary | ICD-10-CM | POA: Diagnosis not present

## 2020-04-20 DIAGNOSIS — G20 Parkinson's disease: Secondary | ICD-10-CM | POA: Diagnosis not present

## 2020-04-20 DIAGNOSIS — S3289XD Fracture of other parts of pelvis, subsequent encounter for fracture with routine healing: Secondary | ICD-10-CM | POA: Diagnosis not present

## 2020-04-20 DIAGNOSIS — I10 Essential (primary) hypertension: Secondary | ICD-10-CM | POA: Diagnosis not present

## 2020-04-20 DIAGNOSIS — S61412D Laceration without foreign body of left hand, subsequent encounter: Secondary | ICD-10-CM | POA: Diagnosis not present

## 2020-04-20 DIAGNOSIS — I4891 Unspecified atrial fibrillation: Secondary | ICD-10-CM | POA: Diagnosis not present

## 2020-04-22 DIAGNOSIS — S51012D Laceration without foreign body of left elbow, subsequent encounter: Secondary | ICD-10-CM | POA: Diagnosis not present

## 2020-04-22 DIAGNOSIS — E063 Autoimmune thyroiditis: Secondary | ICD-10-CM | POA: Diagnosis not present

## 2020-04-22 DIAGNOSIS — I10 Essential (primary) hypertension: Secondary | ICD-10-CM | POA: Diagnosis not present

## 2020-04-22 DIAGNOSIS — G20 Parkinson's disease: Secondary | ICD-10-CM | POA: Diagnosis not present

## 2020-04-22 DIAGNOSIS — S61412D Laceration without foreign body of left hand, subsequent encounter: Secondary | ICD-10-CM | POA: Diagnosis not present

## 2020-04-22 DIAGNOSIS — I4819 Other persistent atrial fibrillation: Secondary | ICD-10-CM | POA: Diagnosis not present

## 2020-04-22 DIAGNOSIS — I951 Orthostatic hypotension: Secondary | ICD-10-CM | POA: Diagnosis not present

## 2020-04-22 DIAGNOSIS — S3289XD Fracture of other parts of pelvis, subsequent encounter for fracture with routine healing: Secondary | ICD-10-CM | POA: Diagnosis not present

## 2020-04-22 DIAGNOSIS — I4891 Unspecified atrial fibrillation: Secondary | ICD-10-CM | POA: Diagnosis not present

## 2020-04-22 DIAGNOSIS — M810 Age-related osteoporosis without current pathological fracture: Secondary | ICD-10-CM | POA: Diagnosis not present

## 2020-04-23 DIAGNOSIS — G20 Parkinson's disease: Secondary | ICD-10-CM | POA: Diagnosis not present

## 2020-04-23 DIAGNOSIS — I10 Essential (primary) hypertension: Secondary | ICD-10-CM | POA: Diagnosis not present

## 2020-04-23 DIAGNOSIS — S61412D Laceration without foreign body of left hand, subsequent encounter: Secondary | ICD-10-CM | POA: Diagnosis not present

## 2020-04-23 DIAGNOSIS — S3289XD Fracture of other parts of pelvis, subsequent encounter for fracture with routine healing: Secondary | ICD-10-CM | POA: Diagnosis not present

## 2020-04-23 DIAGNOSIS — I4891 Unspecified atrial fibrillation: Secondary | ICD-10-CM | POA: Diagnosis not present

## 2020-04-23 DIAGNOSIS — S51012D Laceration without foreign body of left elbow, subsequent encounter: Secondary | ICD-10-CM | POA: Diagnosis not present

## 2020-04-24 DIAGNOSIS — I10 Essential (primary) hypertension: Secondary | ICD-10-CM | POA: Diagnosis not present

## 2020-04-24 DIAGNOSIS — S51012D Laceration without foreign body of left elbow, subsequent encounter: Secondary | ICD-10-CM | POA: Diagnosis not present

## 2020-04-24 DIAGNOSIS — I4891 Unspecified atrial fibrillation: Secondary | ICD-10-CM | POA: Diagnosis not present

## 2020-04-24 DIAGNOSIS — S3289XD Fracture of other parts of pelvis, subsequent encounter for fracture with routine healing: Secondary | ICD-10-CM | POA: Diagnosis not present

## 2020-04-24 DIAGNOSIS — G20 Parkinson's disease: Secondary | ICD-10-CM | POA: Diagnosis not present

## 2020-04-24 DIAGNOSIS — S61412D Laceration without foreign body of left hand, subsequent encounter: Secondary | ICD-10-CM | POA: Diagnosis not present

## 2020-04-27 ENCOUNTER — Telehealth: Payer: Self-pay | Admitting: Cardiovascular Disease

## 2020-04-27 NOTE — Progress Notes (Signed)
Remote pacemaker transmission.   

## 2020-04-27 NOTE — Telephone Encounter (Signed)
Left a message for Maudie Mercury to call back for more information.

## 2020-04-27 NOTE — Telephone Encounter (Signed)
  Kim from [facility] is calling because A[facility] was placed on midodrine (PROAMATINE) 2.5 MG tablet on 04/13/20 and her BP is going from 124/66 to 70/40 when the patient goes from lying down to sitting up. She is also experiencing dizziness and blurred vision. Maudie Mercury would like to know if the patient can be given a higher dose. She advised that a message can be left on her voicemail as it is a secure line. [facility] [place] [facility]

## 2020-04-28 DIAGNOSIS — I4891 Unspecified atrial fibrillation: Secondary | ICD-10-CM | POA: Diagnosis not present

## 2020-04-28 DIAGNOSIS — G20 Parkinson's disease: Secondary | ICD-10-CM | POA: Diagnosis not present

## 2020-04-28 DIAGNOSIS — I10 Essential (primary) hypertension: Secondary | ICD-10-CM | POA: Diagnosis not present

## 2020-04-28 DIAGNOSIS — S51012D Laceration without foreign body of left elbow, subsequent encounter: Secondary | ICD-10-CM | POA: Diagnosis not present

## 2020-04-28 DIAGNOSIS — S61412D Laceration without foreign body of left hand, subsequent encounter: Secondary | ICD-10-CM | POA: Diagnosis not present

## 2020-04-28 DIAGNOSIS — S3289XD Fracture of other parts of pelvis, subsequent encounter for fracture with routine healing: Secondary | ICD-10-CM | POA: Diagnosis not present

## 2020-04-28 MED ORDER — MIDODRINE HCL 5 MG PO TABS: ORAL_TABLET | ORAL | 11 refills | Status: DC

## 2020-04-28 NOTE — Telephone Encounter (Signed)
Spoke with kim, orders given.

## 2020-04-28 NOTE — Telephone Encounter (Signed)
Spoke with amy, she reports that the orthostatic symptoms the patient is having is interfering with her OT and PT. They do exercises with her while lying and sitting and her bp will still drop to 90/50 or 70/40. As soon as the patient lays down the bp will go back up. She has gotten a better appetite and is now starting to eat better and is drinking boost. She is on midodrine 2.5 mg ist dose when getting up and another dose 4 hours later. They are doing her PT around 1 pm. They are asking for increase in medications. Aware dr croitoru is not in the office today but will forward for his review.

## 2020-04-28 NOTE — Telephone Encounter (Signed)
Please increase the midodrine to 5 mg twice daily. Give this a couple of days. Can increase further to 10 mg twice daily if necessary. Would also recommend using an abdominal binder during upright exercises

## 2020-04-29 DIAGNOSIS — S61412D Laceration without foreign body of left hand, subsequent encounter: Secondary | ICD-10-CM | POA: Diagnosis not present

## 2020-04-29 DIAGNOSIS — G20 Parkinson's disease: Secondary | ICD-10-CM | POA: Diagnosis not present

## 2020-04-29 DIAGNOSIS — S3289XD Fracture of other parts of pelvis, subsequent encounter for fracture with routine healing: Secondary | ICD-10-CM | POA: Diagnosis not present

## 2020-04-29 DIAGNOSIS — S51012D Laceration without foreign body of left elbow, subsequent encounter: Secondary | ICD-10-CM | POA: Diagnosis not present

## 2020-04-29 DIAGNOSIS — I10 Essential (primary) hypertension: Secondary | ICD-10-CM | POA: Diagnosis not present

## 2020-04-29 DIAGNOSIS — I4891 Unspecified atrial fibrillation: Secondary | ICD-10-CM | POA: Diagnosis not present

## 2020-05-01 DIAGNOSIS — I10 Essential (primary) hypertension: Secondary | ICD-10-CM | POA: Diagnosis not present

## 2020-05-01 DIAGNOSIS — S3289XD Fracture of other parts of pelvis, subsequent encounter for fracture with routine healing: Secondary | ICD-10-CM | POA: Diagnosis not present

## 2020-05-01 DIAGNOSIS — R2681 Unsteadiness on feet: Secondary | ICD-10-CM | POA: Diagnosis not present

## 2020-05-01 DIAGNOSIS — R531 Weakness: Secondary | ICD-10-CM | POA: Diagnosis not present

## 2020-05-01 DIAGNOSIS — I4891 Unspecified atrial fibrillation: Secondary | ICD-10-CM | POA: Diagnosis not present

## 2020-05-01 DIAGNOSIS — G20 Parkinson's disease: Secondary | ICD-10-CM | POA: Diagnosis not present

## 2020-05-01 DIAGNOSIS — S51012D Laceration without foreign body of left elbow, subsequent encounter: Secondary | ICD-10-CM | POA: Diagnosis not present

## 2020-05-01 DIAGNOSIS — S61412D Laceration without foreign body of left hand, subsequent encounter: Secondary | ICD-10-CM | POA: Diagnosis not present

## 2020-05-02 DIAGNOSIS — S3289XD Fracture of other parts of pelvis, subsequent encounter for fracture with routine healing: Secondary | ICD-10-CM | POA: Diagnosis not present

## 2020-05-02 DIAGNOSIS — S51012D Laceration without foreign body of left elbow, subsequent encounter: Secondary | ICD-10-CM | POA: Diagnosis not present

## 2020-05-02 DIAGNOSIS — I4891 Unspecified atrial fibrillation: Secondary | ICD-10-CM | POA: Diagnosis not present

## 2020-05-02 DIAGNOSIS — G20 Parkinson's disease: Secondary | ICD-10-CM | POA: Diagnosis not present

## 2020-05-02 DIAGNOSIS — S61412D Laceration without foreign body of left hand, subsequent encounter: Secondary | ICD-10-CM | POA: Diagnosis not present

## 2020-05-02 DIAGNOSIS — I10 Essential (primary) hypertension: Secondary | ICD-10-CM | POA: Diagnosis not present

## 2020-05-05 ENCOUNTER — Telehealth: Payer: Self-pay | Admitting: Neurology

## 2020-05-05 DIAGNOSIS — I10 Essential (primary) hypertension: Secondary | ICD-10-CM | POA: Diagnosis not present

## 2020-05-05 DIAGNOSIS — S3289XD Fracture of other parts of pelvis, subsequent encounter for fracture with routine healing: Secondary | ICD-10-CM | POA: Diagnosis not present

## 2020-05-05 DIAGNOSIS — S61412D Laceration without foreign body of left hand, subsequent encounter: Secondary | ICD-10-CM | POA: Diagnosis not present

## 2020-05-05 DIAGNOSIS — G20 Parkinson's disease: Secondary | ICD-10-CM | POA: Diagnosis not present

## 2020-05-05 DIAGNOSIS — S51012D Laceration without foreign body of left elbow, subsequent encounter: Secondary | ICD-10-CM | POA: Diagnosis not present

## 2020-05-05 DIAGNOSIS — I4891 Unspecified atrial fibrillation: Secondary | ICD-10-CM | POA: Diagnosis not present

## 2020-05-05 NOTE — Telephone Encounter (Signed)
This is all addressed in last note and with patient.  She cannot have meds for hallucinations per cardiology because of potential SE issues with the heart.  With the other issue, yes, as in the chart, her BP issue could be related to her Parkinsons Disease but cardiology had her on florinef to raise the blood pressure and blood pressure was going too high when I last saw her.  Regardless of etiology, cardiology was managing.

## 2020-05-05 NOTE — Telephone Encounter (Signed)
Spoke with patients spouse and gave him Dr Doristine Devoid recommendations. He voiced understanding.   He also informed me that he is giving the patient carbidopa levodopa 2 tabs at 8am, 1 tab at 1pm and 1 tab at 4pm. I advised spouse that those instructions are correct however that medication is for parkinsons disease not hallucinations. He voiced understanding.

## 2020-05-05 NOTE — Telephone Encounter (Signed)
Patient is having an increase in her tremors and hallucinations. She is having some drops in her blood pressure when she stands up. He cardiologist, Dr. Sallyanne Kuster, wants to know could this be related to her PD?

## 2020-05-05 NOTE — Telephone Encounter (Signed)
Advised Nurse of the notes. Per Maudie Mercury she thinks that the husband hasn't come to gribs to everything.

## 2020-05-06 DIAGNOSIS — I4891 Unspecified atrial fibrillation: Secondary | ICD-10-CM | POA: Diagnosis not present

## 2020-05-06 DIAGNOSIS — S61412D Laceration without foreign body of left hand, subsequent encounter: Secondary | ICD-10-CM | POA: Diagnosis not present

## 2020-05-06 DIAGNOSIS — I10 Essential (primary) hypertension: Secondary | ICD-10-CM | POA: Diagnosis not present

## 2020-05-06 DIAGNOSIS — S3289XD Fracture of other parts of pelvis, subsequent encounter for fracture with routine healing: Secondary | ICD-10-CM | POA: Diagnosis not present

## 2020-05-06 DIAGNOSIS — S51012D Laceration without foreign body of left elbow, subsequent encounter: Secondary | ICD-10-CM | POA: Diagnosis not present

## 2020-05-06 DIAGNOSIS — G20 Parkinson's disease: Secondary | ICD-10-CM | POA: Diagnosis not present

## 2020-05-07 DIAGNOSIS — G20 Parkinson's disease: Secondary | ICD-10-CM | POA: Diagnosis not present

## 2020-05-07 DIAGNOSIS — I4891 Unspecified atrial fibrillation: Secondary | ICD-10-CM | POA: Diagnosis not present

## 2020-05-07 DIAGNOSIS — I10 Essential (primary) hypertension: Secondary | ICD-10-CM | POA: Diagnosis not present

## 2020-05-07 DIAGNOSIS — S51012D Laceration without foreign body of left elbow, subsequent encounter: Secondary | ICD-10-CM | POA: Diagnosis not present

## 2020-05-07 DIAGNOSIS — S3289XD Fracture of other parts of pelvis, subsequent encounter for fracture with routine healing: Secondary | ICD-10-CM | POA: Diagnosis not present

## 2020-05-07 DIAGNOSIS — S61412D Laceration without foreign body of left hand, subsequent encounter: Secondary | ICD-10-CM | POA: Diagnosis not present

## 2020-05-13 DIAGNOSIS — S51012D Laceration without foreign body of left elbow, subsequent encounter: Secondary | ICD-10-CM | POA: Diagnosis not present

## 2020-05-13 DIAGNOSIS — S61412D Laceration without foreign body of left hand, subsequent encounter: Secondary | ICD-10-CM | POA: Diagnosis not present

## 2020-05-13 DIAGNOSIS — I4891 Unspecified atrial fibrillation: Secondary | ICD-10-CM | POA: Diagnosis not present

## 2020-05-13 DIAGNOSIS — G20 Parkinson's disease: Secondary | ICD-10-CM | POA: Diagnosis not present

## 2020-05-13 DIAGNOSIS — I10 Essential (primary) hypertension: Secondary | ICD-10-CM | POA: Diagnosis not present

## 2020-05-13 DIAGNOSIS — S3289XD Fracture of other parts of pelvis, subsequent encounter for fracture with routine healing: Secondary | ICD-10-CM | POA: Diagnosis not present

## 2020-05-14 DIAGNOSIS — I4891 Unspecified atrial fibrillation: Secondary | ICD-10-CM | POA: Diagnosis not present

## 2020-05-14 DIAGNOSIS — I10 Essential (primary) hypertension: Secondary | ICD-10-CM | POA: Diagnosis not present

## 2020-05-14 DIAGNOSIS — S3289XD Fracture of other parts of pelvis, subsequent encounter for fracture with routine healing: Secondary | ICD-10-CM | POA: Diagnosis not present

## 2020-05-14 DIAGNOSIS — G20 Parkinson's disease: Secondary | ICD-10-CM | POA: Diagnosis not present

## 2020-05-14 DIAGNOSIS — S51012D Laceration without foreign body of left elbow, subsequent encounter: Secondary | ICD-10-CM | POA: Diagnosis not present

## 2020-05-14 DIAGNOSIS — S61412D Laceration without foreign body of left hand, subsequent encounter: Secondary | ICD-10-CM | POA: Diagnosis not present

## 2020-05-18 DIAGNOSIS — G20 Parkinson's disease: Secondary | ICD-10-CM | POA: Diagnosis not present

## 2020-05-18 DIAGNOSIS — I4891 Unspecified atrial fibrillation: Secondary | ICD-10-CM | POA: Diagnosis not present

## 2020-05-18 DIAGNOSIS — I10 Essential (primary) hypertension: Secondary | ICD-10-CM | POA: Diagnosis not present

## 2020-05-18 DIAGNOSIS — S3289XD Fracture of other parts of pelvis, subsequent encounter for fracture with routine healing: Secondary | ICD-10-CM | POA: Diagnosis not present

## 2020-05-18 DIAGNOSIS — S61412D Laceration without foreign body of left hand, subsequent encounter: Secondary | ICD-10-CM | POA: Diagnosis not present

## 2020-05-18 DIAGNOSIS — S51012D Laceration without foreign body of left elbow, subsequent encounter: Secondary | ICD-10-CM | POA: Diagnosis not present

## 2020-05-19 ENCOUNTER — Telehealth: Payer: Self-pay | Admitting: Cardiovascular Disease

## 2020-05-19 DIAGNOSIS — I10 Essential (primary) hypertension: Secondary | ICD-10-CM | POA: Diagnosis not present

## 2020-05-19 DIAGNOSIS — S51012D Laceration without foreign body of left elbow, subsequent encounter: Secondary | ICD-10-CM | POA: Diagnosis not present

## 2020-05-19 DIAGNOSIS — S61412D Laceration without foreign body of left hand, subsequent encounter: Secondary | ICD-10-CM | POA: Diagnosis not present

## 2020-05-19 DIAGNOSIS — G20 Parkinson's disease: Secondary | ICD-10-CM | POA: Diagnosis not present

## 2020-05-19 DIAGNOSIS — S3289XD Fracture of other parts of pelvis, subsequent encounter for fracture with routine healing: Secondary | ICD-10-CM | POA: Diagnosis not present

## 2020-05-19 DIAGNOSIS — I4891 Unspecified atrial fibrillation: Secondary | ICD-10-CM | POA: Diagnosis not present

## 2020-05-19 MED ORDER — FLUDROCORTISONE ACETATE 0.1 MG PO TABS: 0.2000 mg | ORAL_TABLET | Freq: Every day | ORAL | 6 refills | Status: DC

## 2020-05-19 MED ORDER — FLUDROCORTISONE ACETATE 0.1 MG PO TABS: 0.1000 mg | ORAL_TABLET | Freq: Every day | ORAL | 6 refills | Status: DC

## 2020-05-19 NOTE — Telephone Encounter (Signed)
Spoke to patient's husband Dr.Croitoru advised to increase Florinef to 0.2 mg daily. Called Kim with [facility] left message on her personal voice mail Dr.Croitoru advised increase Florinef to 0.2 mg daily.

## 2020-05-19 NOTE — Telephone Encounter (Signed)
After reviewing chart patient already taking Florinef 0.1 mg daily.Message sent to Dr.Croitoru for advice.

## 2020-05-19 NOTE — Telephone Encounter (Signed)
Received a call from [place] with [facility] calling to report B/P still drops when patient sits up.Stated she has been taking Midodrine 10 mg twice a day for the past 2 1/2 weeks.B/P today supine 130/62  Sitting 84/50,72/48.Patient is wearing abdominal binder.Patient becomes very dizzy,blurred vision.Stated she wanted to ask Dr.Croitoru if it is time to talk about palliative care or hospice care.Advised I will send message to Dr.Croitoru for advice.

## 2020-05-19 NOTE — Telephone Encounter (Signed)
Spoke to [employer] with [facility] Dr.Croitoru's advice given.Prescription for Florinef 0.1 mg daily sent to pharmacy.

## 2020-05-19 NOTE — Addendum Note (Signed)
Addended by: Kathyrn Lass on: 05/19/2020 05:34 PM   Modules accepted: Orders

## 2020-05-19 NOTE — Telephone Encounter (Signed)
Pt c/o BP issue: STAT if pt c/o blurred vision, one-sided weakness or slurred speech  1. What are your last 5 BP readings?  130/62; 84/50, 82/80, 92/50, 138/60, 72/48  2. Are you having any other symptoms (ex. Dizziness, headache, blurred vision, passed out)? Blurred vision, Lightheaded, dizziness, almost passed out  3. What is your BP issue? Patient experiencing very low BP

## 2020-05-19 NOTE — Telephone Encounter (Signed)
Sorry, I mean increase to 0.2 mg daily

## 2020-05-19 NOTE — Telephone Encounter (Signed)
I think palliative care consultation is very reasonable. Let's also try adding fludrocortisone 0.1 mg daily please - may take 1-2 weeks to kick in.

## 2020-05-20 DIAGNOSIS — I10 Essential (primary) hypertension: Secondary | ICD-10-CM | POA: Diagnosis not present

## 2020-05-20 DIAGNOSIS — G20 Parkinson's disease: Secondary | ICD-10-CM | POA: Diagnosis not present

## 2020-05-20 DIAGNOSIS — S51012D Laceration without foreign body of left elbow, subsequent encounter: Secondary | ICD-10-CM | POA: Diagnosis not present

## 2020-05-20 DIAGNOSIS — S3289XD Fracture of other parts of pelvis, subsequent encounter for fracture with routine healing: Secondary | ICD-10-CM | POA: Diagnosis not present

## 2020-05-20 DIAGNOSIS — I4891 Unspecified atrial fibrillation: Secondary | ICD-10-CM | POA: Diagnosis not present

## 2020-05-20 DIAGNOSIS — S61412D Laceration without foreign body of left hand, subsequent encounter: Secondary | ICD-10-CM | POA: Diagnosis not present

## 2020-05-23 DIAGNOSIS — E063 Autoimmune thyroiditis: Secondary | ICD-10-CM | POA: Diagnosis not present

## 2020-05-23 DIAGNOSIS — I951 Orthostatic hypotension: Secondary | ICD-10-CM | POA: Diagnosis not present

## 2020-05-23 DIAGNOSIS — I4819 Other persistent atrial fibrillation: Secondary | ICD-10-CM | POA: Diagnosis not present

## 2020-05-23 DIAGNOSIS — M810 Age-related osteoporosis without current pathological fracture: Secondary | ICD-10-CM | POA: Diagnosis not present

## 2020-05-25 DIAGNOSIS — S61412D Laceration without foreign body of left hand, subsequent encounter: Secondary | ICD-10-CM | POA: Diagnosis not present

## 2020-05-25 DIAGNOSIS — I10 Essential (primary) hypertension: Secondary | ICD-10-CM | POA: Diagnosis not present

## 2020-05-25 DIAGNOSIS — S51012D Laceration without foreign body of left elbow, subsequent encounter: Secondary | ICD-10-CM | POA: Diagnosis not present

## 2020-05-25 DIAGNOSIS — I4891 Unspecified atrial fibrillation: Secondary | ICD-10-CM | POA: Diagnosis not present

## 2020-05-25 DIAGNOSIS — S3289XD Fracture of other parts of pelvis, subsequent encounter for fracture with routine healing: Secondary | ICD-10-CM | POA: Diagnosis not present

## 2020-05-25 DIAGNOSIS — G20 Parkinson's disease: Secondary | ICD-10-CM | POA: Diagnosis not present

## 2020-05-26 DIAGNOSIS — I10 Essential (primary) hypertension: Secondary | ICD-10-CM | POA: Diagnosis not present

## 2020-05-26 DIAGNOSIS — S51012D Laceration without foreign body of left elbow, subsequent encounter: Secondary | ICD-10-CM | POA: Diagnosis not present

## 2020-05-26 DIAGNOSIS — S61412D Laceration without foreign body of left hand, subsequent encounter: Secondary | ICD-10-CM | POA: Diagnosis not present

## 2020-05-26 DIAGNOSIS — S3289XD Fracture of other parts of pelvis, subsequent encounter for fracture with routine healing: Secondary | ICD-10-CM | POA: Diagnosis not present

## 2020-05-26 DIAGNOSIS — I4891 Unspecified atrial fibrillation: Secondary | ICD-10-CM | POA: Diagnosis not present

## 2020-05-26 DIAGNOSIS — G20 Parkinson's disease: Secondary | ICD-10-CM | POA: Diagnosis not present

## 2020-05-28 DIAGNOSIS — S3289XD Fracture of other parts of pelvis, subsequent encounter for fracture with routine healing: Secondary | ICD-10-CM | POA: Diagnosis not present

## 2020-05-28 DIAGNOSIS — G20 Parkinson's disease: Secondary | ICD-10-CM | POA: Diagnosis not present

## 2020-05-28 DIAGNOSIS — I4891 Unspecified atrial fibrillation: Secondary | ICD-10-CM | POA: Diagnosis not present

## 2020-05-28 DIAGNOSIS — I10 Essential (primary) hypertension: Secondary | ICD-10-CM | POA: Diagnosis not present

## 2020-05-28 DIAGNOSIS — S51012D Laceration without foreign body of left elbow, subsequent encounter: Secondary | ICD-10-CM | POA: Diagnosis not present

## 2020-05-28 DIAGNOSIS — S61412D Laceration without foreign body of left hand, subsequent encounter: Secondary | ICD-10-CM | POA: Diagnosis not present

## 2020-05-29 DIAGNOSIS — Z515 Encounter for palliative care: Secondary | ICD-10-CM | POA: Diagnosis not present

## 2020-05-29 DIAGNOSIS — G20 Parkinson's disease: Secondary | ICD-10-CM | POA: Diagnosis not present

## 2020-05-29 DIAGNOSIS — G4752 REM sleep behavior disorder: Secondary | ICD-10-CM | POA: Diagnosis not present

## 2020-05-29 DIAGNOSIS — I951 Orthostatic hypotension: Secondary | ICD-10-CM | POA: Diagnosis not present

## 2020-05-29 DIAGNOSIS — R441 Visual hallucinations: Secondary | ICD-10-CM | POA: Diagnosis not present

## 2020-05-31 DIAGNOSIS — R2681 Unsteadiness on feet: Secondary | ICD-10-CM | POA: Diagnosis not present

## 2020-05-31 DIAGNOSIS — S61412D Laceration without foreign body of left hand, subsequent encounter: Secondary | ICD-10-CM | POA: Diagnosis not present

## 2020-05-31 DIAGNOSIS — I10 Essential (primary) hypertension: Secondary | ICD-10-CM | POA: Diagnosis not present

## 2020-05-31 DIAGNOSIS — Z7901 Long term (current) use of anticoagulants: Secondary | ICD-10-CM | POA: Diagnosis not present

## 2020-05-31 DIAGNOSIS — I4891 Unspecified atrial fibrillation: Secondary | ICD-10-CM | POA: Diagnosis not present

## 2020-05-31 DIAGNOSIS — G20 Parkinson's disease: Secondary | ICD-10-CM | POA: Diagnosis not present

## 2020-05-31 DIAGNOSIS — S51012D Laceration without foreign body of left elbow, subsequent encounter: Secondary | ICD-10-CM | POA: Diagnosis not present

## 2020-05-31 DIAGNOSIS — S3289XD Fracture of other parts of pelvis, subsequent encounter for fracture with routine healing: Secondary | ICD-10-CM | POA: Diagnosis not present

## 2020-05-31 DIAGNOSIS — R531 Weakness: Secondary | ICD-10-CM | POA: Diagnosis not present

## 2020-06-01 DIAGNOSIS — S3289XD Fracture of other parts of pelvis, subsequent encounter for fracture with routine healing: Secondary | ICD-10-CM | POA: Diagnosis not present

## 2020-06-01 DIAGNOSIS — I10 Essential (primary) hypertension: Secondary | ICD-10-CM | POA: Diagnosis not present

## 2020-06-01 DIAGNOSIS — S61412D Laceration without foreign body of left hand, subsequent encounter: Secondary | ICD-10-CM | POA: Diagnosis not present

## 2020-06-01 DIAGNOSIS — G20 Parkinson's disease: Secondary | ICD-10-CM | POA: Diagnosis not present

## 2020-06-01 DIAGNOSIS — I4891 Unspecified atrial fibrillation: Secondary | ICD-10-CM | POA: Diagnosis not present

## 2020-06-01 DIAGNOSIS — S51012D Laceration without foreign body of left elbow, subsequent encounter: Secondary | ICD-10-CM | POA: Diagnosis not present

## 2020-06-03 DIAGNOSIS — S61412D Laceration without foreign body of left hand, subsequent encounter: Secondary | ICD-10-CM | POA: Diagnosis not present

## 2020-06-03 DIAGNOSIS — S51012D Laceration without foreign body of left elbow, subsequent encounter: Secondary | ICD-10-CM | POA: Diagnosis not present

## 2020-06-03 DIAGNOSIS — I10 Essential (primary) hypertension: Secondary | ICD-10-CM | POA: Diagnosis not present

## 2020-06-03 DIAGNOSIS — I4891 Unspecified atrial fibrillation: Secondary | ICD-10-CM | POA: Diagnosis not present

## 2020-06-03 DIAGNOSIS — S3289XD Fracture of other parts of pelvis, subsequent encounter for fracture with routine healing: Secondary | ICD-10-CM | POA: Diagnosis not present

## 2020-06-03 DIAGNOSIS — G20 Parkinson's disease: Secondary | ICD-10-CM | POA: Diagnosis not present

## 2020-06-08 DIAGNOSIS — G20 Parkinson's disease: Secondary | ICD-10-CM | POA: Diagnosis not present

## 2020-06-08 DIAGNOSIS — S61412D Laceration without foreign body of left hand, subsequent encounter: Secondary | ICD-10-CM | POA: Diagnosis not present

## 2020-06-08 DIAGNOSIS — S3289XD Fracture of other parts of pelvis, subsequent encounter for fracture with routine healing: Secondary | ICD-10-CM | POA: Diagnosis not present

## 2020-06-08 DIAGNOSIS — S51012D Laceration without foreign body of left elbow, subsequent encounter: Secondary | ICD-10-CM | POA: Diagnosis not present

## 2020-06-08 DIAGNOSIS — I4891 Unspecified atrial fibrillation: Secondary | ICD-10-CM | POA: Diagnosis not present

## 2020-06-08 DIAGNOSIS — I10 Essential (primary) hypertension: Secondary | ICD-10-CM | POA: Diagnosis not present

## 2020-06-09 DIAGNOSIS — I10 Essential (primary) hypertension: Secondary | ICD-10-CM | POA: Diagnosis not present

## 2020-06-09 DIAGNOSIS — G20 Parkinson's disease: Secondary | ICD-10-CM | POA: Diagnosis not present

## 2020-06-09 DIAGNOSIS — S51012D Laceration without foreign body of left elbow, subsequent encounter: Secondary | ICD-10-CM | POA: Diagnosis not present

## 2020-06-09 DIAGNOSIS — S3289XD Fracture of other parts of pelvis, subsequent encounter for fracture with routine healing: Secondary | ICD-10-CM | POA: Diagnosis not present

## 2020-06-09 DIAGNOSIS — I4891 Unspecified atrial fibrillation: Secondary | ICD-10-CM | POA: Diagnosis not present

## 2020-06-09 DIAGNOSIS — S61412D Laceration without foreign body of left hand, subsequent encounter: Secondary | ICD-10-CM | POA: Diagnosis not present

## 2020-06-10 DIAGNOSIS — S51012D Laceration without foreign body of left elbow, subsequent encounter: Secondary | ICD-10-CM | POA: Diagnosis not present

## 2020-06-10 DIAGNOSIS — G20 Parkinson's disease: Secondary | ICD-10-CM | POA: Diagnosis not present

## 2020-06-10 DIAGNOSIS — S3289XD Fracture of other parts of pelvis, subsequent encounter for fracture with routine healing: Secondary | ICD-10-CM | POA: Diagnosis not present

## 2020-06-10 DIAGNOSIS — I10 Essential (primary) hypertension: Secondary | ICD-10-CM | POA: Diagnosis not present

## 2020-06-10 DIAGNOSIS — S61412D Laceration without foreign body of left hand, subsequent encounter: Secondary | ICD-10-CM | POA: Diagnosis not present

## 2020-06-10 DIAGNOSIS — I4891 Unspecified atrial fibrillation: Secondary | ICD-10-CM | POA: Diagnosis not present

## 2020-06-16 DIAGNOSIS — S51012D Laceration without foreign body of left elbow, subsequent encounter: Secondary | ICD-10-CM | POA: Diagnosis not present

## 2020-06-16 DIAGNOSIS — I4891 Unspecified atrial fibrillation: Secondary | ICD-10-CM | POA: Diagnosis not present

## 2020-06-16 DIAGNOSIS — G20 Parkinson's disease: Secondary | ICD-10-CM | POA: Diagnosis not present

## 2020-06-16 DIAGNOSIS — I10 Essential (primary) hypertension: Secondary | ICD-10-CM | POA: Diagnosis not present

## 2020-06-16 DIAGNOSIS — S61412D Laceration without foreign body of left hand, subsequent encounter: Secondary | ICD-10-CM | POA: Diagnosis not present

## 2020-06-16 DIAGNOSIS — S3289XD Fracture of other parts of pelvis, subsequent encounter for fracture with routine healing: Secondary | ICD-10-CM | POA: Diagnosis not present

## 2020-06-19 DIAGNOSIS — I10 Essential (primary) hypertension: Secondary | ICD-10-CM | POA: Diagnosis not present

## 2020-06-19 DIAGNOSIS — I4891 Unspecified atrial fibrillation: Secondary | ICD-10-CM | POA: Diagnosis not present

## 2020-06-19 DIAGNOSIS — S3289XD Fracture of other parts of pelvis, subsequent encounter for fracture with routine healing: Secondary | ICD-10-CM | POA: Diagnosis not present

## 2020-06-19 DIAGNOSIS — G20 Parkinson's disease: Secondary | ICD-10-CM | POA: Diagnosis not present

## 2020-06-19 DIAGNOSIS — S51012D Laceration without foreign body of left elbow, subsequent encounter: Secondary | ICD-10-CM | POA: Diagnosis not present

## 2020-06-19 DIAGNOSIS — S61412D Laceration without foreign body of left hand, subsequent encounter: Secondary | ICD-10-CM | POA: Diagnosis not present

## 2020-06-23 DIAGNOSIS — I10 Essential (primary) hypertension: Secondary | ICD-10-CM | POA: Diagnosis not present

## 2020-06-23 DIAGNOSIS — M810 Age-related osteoporosis without current pathological fracture: Secondary | ICD-10-CM | POA: Diagnosis not present

## 2020-06-23 DIAGNOSIS — I4819 Other persistent atrial fibrillation: Secondary | ICD-10-CM | POA: Diagnosis not present

## 2020-06-23 DIAGNOSIS — G20 Parkinson's disease: Secondary | ICD-10-CM | POA: Diagnosis not present

## 2020-06-23 DIAGNOSIS — S3289XD Fracture of other parts of pelvis, subsequent encounter for fracture with routine healing: Secondary | ICD-10-CM | POA: Diagnosis not present

## 2020-06-23 DIAGNOSIS — S61412D Laceration without foreign body of left hand, subsequent encounter: Secondary | ICD-10-CM | POA: Diagnosis not present

## 2020-06-23 DIAGNOSIS — I951 Orthostatic hypotension: Secondary | ICD-10-CM | POA: Diagnosis not present

## 2020-06-23 DIAGNOSIS — I4891 Unspecified atrial fibrillation: Secondary | ICD-10-CM | POA: Diagnosis not present

## 2020-06-23 DIAGNOSIS — E063 Autoimmune thyroiditis: Secondary | ICD-10-CM | POA: Diagnosis not present

## 2020-06-23 DIAGNOSIS — S51012D Laceration without foreign body of left elbow, subsequent encounter: Secondary | ICD-10-CM | POA: Diagnosis not present

## 2020-06-30 ENCOUNTER — Telehealth: Payer: Self-pay | Admitting: *Deleted

## 2020-06-30 MED ORDER — MIDODRINE HCL 5 MG PO TABS: ORAL_TABLET | ORAL | 0 refills | Status: DC

## 2020-06-30 NOTE — Telephone Encounter (Signed)
Refills sent into the pharmacy

## 2020-07-01 DIAGNOSIS — Z85828 Personal history of other malignant neoplasm of skin: Secondary | ICD-10-CM | POA: Diagnosis not present

## 2020-07-01 DIAGNOSIS — Z08 Encounter for follow-up examination after completed treatment for malignant neoplasm: Secondary | ICD-10-CM | POA: Diagnosis not present

## 2020-07-01 DIAGNOSIS — C4442 Squamous cell carcinoma of skin of scalp and neck: Secondary | ICD-10-CM | POA: Diagnosis not present

## 2020-07-02 ENCOUNTER — Other Ambulatory Visit: Payer: Self-pay | Admitting: *Deleted

## 2020-07-02 DIAGNOSIS — R441 Visual hallucinations: Secondary | ICD-10-CM | POA: Diagnosis not present

## 2020-07-02 DIAGNOSIS — I951 Orthostatic hypotension: Secondary | ICD-10-CM | POA: Diagnosis not present

## 2020-07-02 DIAGNOSIS — G4752 REM sleep behavior disorder: Secondary | ICD-10-CM | POA: Diagnosis not present

## 2020-07-02 DIAGNOSIS — Z515 Encounter for palliative care: Secondary | ICD-10-CM | POA: Diagnosis not present

## 2020-07-02 DIAGNOSIS — C4442 Squamous cell carcinoma of skin of scalp and neck: Secondary | ICD-10-CM | POA: Diagnosis not present

## 2020-07-02 DIAGNOSIS — G20 Parkinson's disease: Secondary | ICD-10-CM | POA: Diagnosis not present

## 2020-07-02 MED ORDER — FLUDROCORTISONE ACETATE 0.1 MG PO TABS: ORAL_TABLET | ORAL | 6 refills | Status: AC

## 2020-07-02 MED ORDER — MIDODRINE HCL 10 MG PO TABS: ORAL_TABLET | ORAL | 5 refills | Status: AC

## 2020-07-02 NOTE — Progress Notes (Signed)
New prescription sent in for the patient for Florinef, per Dr. Sallyanne Kuster. Patient's husband has been made aware.

## 2020-07-02 NOTE — Progress Notes (Signed)
Prescription for Midodrine 10 mg once daily sent in with an option to take a second dose as needed (per Dr. Sallyanne Kuster)

## 2020-07-16 ENCOUNTER — Ambulatory Visit (INDEPENDENT_AMBULATORY_CARE_PROVIDER_SITE_OTHER): Payer: Medicare Other

## 2020-07-16 DIAGNOSIS — I495 Sick sinus syndrome: Secondary | ICD-10-CM | POA: Diagnosis not present

## 2020-07-20 LAB — CUP PACEART REMOTE DEVICE CHECK
Battery Impedance: 1011 Ohm
Battery Remaining Longevity: 70 mo
Battery Voltage: 2.78 V
Brady Statistic AP VP Percent: 0 %
Brady Statistic AP VS Percent: 76 %
Brady Statistic AS VP Percent: 0 %
Brady Statistic AS VS Percent: 24 %
Date Time Interrogation Session: 20220627144238
Implantable Lead Implant Date: 20130611
Implantable Lead Implant Date: 20130611
Implantable Lead Location: 753859
Implantable Lead Location: 753860
Implantable Lead Model: 5076
Implantable Lead Model: 5076
Implantable Pulse Generator Implant Date: 20130611
Lead Channel Impedance Value: 408 Ohm
Lead Channel Impedance Value: 535 Ohm
Lead Channel Pacing Threshold Amplitude: 0.625 V
Lead Channel Pacing Threshold Amplitude: 0.625 V
Lead Channel Pacing Threshold Pulse Width: 0.4 ms
Lead Channel Pacing Threshold Pulse Width: 0.4 ms
Lead Channel Setting Pacing Amplitude: 1.5 V
Lead Channel Setting Pacing Amplitude: 2 V
Lead Channel Setting Pacing Pulse Width: 0.4 ms
Lead Channel Setting Sensing Sensitivity: 5.6 mV

## 2020-07-23 DIAGNOSIS — I951 Orthostatic hypotension: Secondary | ICD-10-CM | POA: Diagnosis not present

## 2020-07-23 DIAGNOSIS — I4819 Other persistent atrial fibrillation: Secondary | ICD-10-CM | POA: Diagnosis not present

## 2020-07-23 DIAGNOSIS — E063 Autoimmune thyroiditis: Secondary | ICD-10-CM | POA: Diagnosis not present

## 2020-07-23 DIAGNOSIS — M810 Age-related osteoporosis without current pathological fracture: Secondary | ICD-10-CM | POA: Diagnosis not present

## 2020-07-24 ENCOUNTER — Other Ambulatory Visit (HOSPITAL_COMMUNITY): Payer: Self-pay | Admitting: Family Medicine

## 2020-07-24 DIAGNOSIS — Z681 Body mass index (BMI) 19 or less, adult: Secondary | ICD-10-CM | POA: Diagnosis not present

## 2020-07-24 DIAGNOSIS — R079 Chest pain, unspecified: Secondary | ICD-10-CM

## 2020-07-24 DIAGNOSIS — Z7409 Other reduced mobility: Secondary | ICD-10-CM | POA: Diagnosis not present

## 2020-07-24 DIAGNOSIS — G20 Parkinson's disease: Secondary | ICD-10-CM | POA: Diagnosis not present

## 2020-07-24 DIAGNOSIS — C4442 Squamous cell carcinoma of skin of scalp and neck: Secondary | ICD-10-CM | POA: Diagnosis not present

## 2020-07-24 DIAGNOSIS — Z1331 Encounter for screening for depression: Secondary | ICD-10-CM | POA: Diagnosis not present

## 2020-07-28 ENCOUNTER — Other Ambulatory Visit (HOSPITAL_COMMUNITY): Payer: Self-pay | Admitting: Internal Medicine

## 2020-07-28 DIAGNOSIS — S8991XA Unspecified injury of right lower leg, initial encounter: Secondary | ICD-10-CM

## 2020-07-28 DIAGNOSIS — S99921A Unspecified injury of right foot, initial encounter: Secondary | ICD-10-CM

## 2020-07-31 ENCOUNTER — Ambulatory Visit (HOSPITAL_COMMUNITY)
Admission: RE | Admit: 2020-07-31 | Discharge: 2020-07-31 | Disposition: A | Discharge status: Home / Self Care | Payer: Medicare Other | Source: Ambulatory Visit | Attending: Family Medicine | Admitting: Family Medicine

## 2020-07-31 ENCOUNTER — Other Ambulatory Visit (HOSPITAL_COMMUNITY): Payer: Self-pay | Admitting: Family Medicine

## 2020-07-31 ENCOUNTER — Other Ambulatory Visit: Payer: Self-pay

## 2020-07-31 DIAGNOSIS — M810 Age-related osteoporosis without current pathological fracture: Secondary | ICD-10-CM | POA: Diagnosis not present

## 2020-07-31 DIAGNOSIS — M7989 Other specified soft tissue disorders: Secondary | ICD-10-CM | POA: Diagnosis not present

## 2020-07-31 DIAGNOSIS — S99921A Unspecified injury of right foot, initial encounter: Secondary | ICD-10-CM | POA: Diagnosis not present

## 2020-07-31 DIAGNOSIS — R079 Chest pain, unspecified: Secondary | ICD-10-CM | POA: Diagnosis not present

## 2020-07-31 DIAGNOSIS — S8991XA Unspecified injury of right lower leg, initial encounter: Secondary | ICD-10-CM | POA: Diagnosis not present

## 2020-08-03 DIAGNOSIS — G20 Parkinson's disease: Secondary | ICD-10-CM | POA: Diagnosis not present

## 2020-08-03 DIAGNOSIS — G4752 REM sleep behavior disorder: Secondary | ICD-10-CM | POA: Diagnosis not present

## 2020-08-03 DIAGNOSIS — C4442 Squamous cell carcinoma of skin of scalp and neck: Secondary | ICD-10-CM | POA: Diagnosis not present

## 2020-08-03 DIAGNOSIS — I951 Orthostatic hypotension: Secondary | ICD-10-CM | POA: Diagnosis not present

## 2020-08-03 DIAGNOSIS — R441 Visual hallucinations: Secondary | ICD-10-CM | POA: Diagnosis not present

## 2020-08-03 DIAGNOSIS — Z515 Encounter for palliative care: Secondary | ICD-10-CM | POA: Diagnosis not present

## 2020-08-04 NOTE — Progress Notes (Signed)
Remote pacemaker transmission.   

## 2020-08-12 DIAGNOSIS — R531 Weakness: Secondary | ICD-10-CM | POA: Diagnosis not present

## 2020-08-12 DIAGNOSIS — S40811D Abrasion of right upper arm, subsequent encounter: Secondary | ICD-10-CM | POA: Diagnosis not present

## 2020-08-12 DIAGNOSIS — G20 Parkinson's disease: Secondary | ICD-10-CM | POA: Diagnosis not present

## 2020-08-12 DIAGNOSIS — S50812D Abrasion of left forearm, subsequent encounter: Secondary | ICD-10-CM | POA: Diagnosis not present

## 2020-08-12 DIAGNOSIS — C4442 Squamous cell carcinoma of skin of scalp and neck: Secondary | ICD-10-CM | POA: Diagnosis not present

## 2020-08-12 DIAGNOSIS — Z741 Need for assistance with personal care: Secondary | ICD-10-CM | POA: Diagnosis not present

## 2020-08-12 DIAGNOSIS — Z9181 History of falling: Secondary | ICD-10-CM | POA: Diagnosis not present

## 2020-08-14 DIAGNOSIS — C4442 Squamous cell carcinoma of skin of scalp and neck: Secondary | ICD-10-CM | POA: Diagnosis not present

## 2020-08-14 DIAGNOSIS — R531 Weakness: Secondary | ICD-10-CM | POA: Diagnosis not present

## 2020-08-14 DIAGNOSIS — Z741 Need for assistance with personal care: Secondary | ICD-10-CM | POA: Diagnosis not present

## 2020-08-14 DIAGNOSIS — S50812D Abrasion of left forearm, subsequent encounter: Secondary | ICD-10-CM | POA: Diagnosis not present

## 2020-08-14 DIAGNOSIS — G20 Parkinson's disease: Secondary | ICD-10-CM | POA: Diagnosis not present

## 2020-08-14 DIAGNOSIS — S40811D Abrasion of right upper arm, subsequent encounter: Secondary | ICD-10-CM | POA: Diagnosis not present

## 2020-08-19 DIAGNOSIS — I951 Orthostatic hypotension: Secondary | ICD-10-CM | POA: Diagnosis not present

## 2020-08-19 DIAGNOSIS — G20 Parkinson's disease: Secondary | ICD-10-CM | POA: Diagnosis not present

## 2020-08-19 DIAGNOSIS — R531 Weakness: Secondary | ICD-10-CM | POA: Diagnosis not present

## 2020-08-19 DIAGNOSIS — R2689 Other abnormalities of gait and mobility: Secondary | ICD-10-CM | POA: Diagnosis not present

## 2020-08-19 DIAGNOSIS — Z741 Need for assistance with personal care: Secondary | ICD-10-CM | POA: Diagnosis not present

## 2020-08-19 DIAGNOSIS — S50812D Abrasion of left forearm, subsequent encounter: Secondary | ICD-10-CM | POA: Diagnosis not present

## 2020-08-19 DIAGNOSIS — R6889 Other general symptoms and signs: Secondary | ICD-10-CM | POA: Diagnosis not present

## 2020-08-19 DIAGNOSIS — S40811D Abrasion of right upper arm, subsequent encounter: Secondary | ICD-10-CM | POA: Diagnosis not present

## 2020-08-19 DIAGNOSIS — C4442 Squamous cell carcinoma of skin of scalp and neck: Secondary | ICD-10-CM | POA: Diagnosis not present

## 2020-08-21 ENCOUNTER — Other Ambulatory Visit: Payer: Self-pay | Admitting: Cardiovascular Disease

## 2020-08-21 DIAGNOSIS — R531 Weakness: Secondary | ICD-10-CM | POA: Diagnosis not present

## 2020-08-21 DIAGNOSIS — G20 Parkinson's disease: Secondary | ICD-10-CM | POA: Diagnosis not present

## 2020-08-21 DIAGNOSIS — S40811D Abrasion of right upper arm, subsequent encounter: Secondary | ICD-10-CM | POA: Diagnosis not present

## 2020-08-21 DIAGNOSIS — Z741 Need for assistance with personal care: Secondary | ICD-10-CM | POA: Diagnosis not present

## 2020-08-21 DIAGNOSIS — S50812D Abrasion of left forearm, subsequent encounter: Secondary | ICD-10-CM | POA: Diagnosis not present

## 2020-08-21 DIAGNOSIS — C4442 Squamous cell carcinoma of skin of scalp and neck: Secondary | ICD-10-CM | POA: Diagnosis not present

## 2020-08-21 NOTE — Telephone Encounter (Signed)
Prescription refill request for Eliquis received. Indication:atrial fib Last office visit:9/21 Scr:1.3 Age: 87 Weight:56.2 kg  Prescription refilled

## 2020-08-23 DIAGNOSIS — I4819 Other persistent atrial fibrillation: Secondary | ICD-10-CM | POA: Diagnosis not present

## 2020-08-23 DIAGNOSIS — I951 Orthostatic hypotension: Secondary | ICD-10-CM | POA: Diagnosis not present

## 2020-08-23 DIAGNOSIS — E063 Autoimmune thyroiditis: Secondary | ICD-10-CM | POA: Diagnosis not present

## 2020-08-23 DIAGNOSIS — M810 Age-related osteoporosis without current pathological fracture: Secondary | ICD-10-CM | POA: Diagnosis not present

## 2020-08-25 DIAGNOSIS — R531 Weakness: Secondary | ICD-10-CM | POA: Diagnosis not present

## 2020-08-25 DIAGNOSIS — S50812D Abrasion of left forearm, subsequent encounter: Secondary | ICD-10-CM | POA: Diagnosis not present

## 2020-08-25 DIAGNOSIS — G20 Parkinson's disease: Secondary | ICD-10-CM | POA: Diagnosis not present

## 2020-08-25 DIAGNOSIS — C4442 Squamous cell carcinoma of skin of scalp and neck: Secondary | ICD-10-CM | POA: Diagnosis not present

## 2020-08-25 DIAGNOSIS — Z741 Need for assistance with personal care: Secondary | ICD-10-CM | POA: Diagnosis not present

## 2020-08-25 DIAGNOSIS — S40811D Abrasion of right upper arm, subsequent encounter: Secondary | ICD-10-CM | POA: Diagnosis not present

## 2020-08-27 DIAGNOSIS — S40811D Abrasion of right upper arm, subsequent encounter: Secondary | ICD-10-CM | POA: Diagnosis not present

## 2020-08-27 DIAGNOSIS — S50812D Abrasion of left forearm, subsequent encounter: Secondary | ICD-10-CM | POA: Diagnosis not present

## 2020-08-27 DIAGNOSIS — C4442 Squamous cell carcinoma of skin of scalp and neck: Secondary | ICD-10-CM | POA: Diagnosis not present

## 2020-08-27 DIAGNOSIS — Z741 Need for assistance with personal care: Secondary | ICD-10-CM | POA: Diagnosis not present

## 2020-08-27 DIAGNOSIS — G20 Parkinson's disease: Secondary | ICD-10-CM | POA: Diagnosis not present

## 2020-08-27 DIAGNOSIS — R531 Weakness: Secondary | ICD-10-CM | POA: Diagnosis not present

## 2020-09-02 DIAGNOSIS — Z741 Need for assistance with personal care: Secondary | ICD-10-CM | POA: Diagnosis not present

## 2020-09-02 DIAGNOSIS — C4442 Squamous cell carcinoma of skin of scalp and neck: Secondary | ICD-10-CM | POA: Diagnosis not present

## 2020-09-02 DIAGNOSIS — S50812D Abrasion of left forearm, subsequent encounter: Secondary | ICD-10-CM | POA: Diagnosis not present

## 2020-09-02 DIAGNOSIS — R531 Weakness: Secondary | ICD-10-CM | POA: Diagnosis not present

## 2020-09-02 DIAGNOSIS — S40811D Abrasion of right upper arm, subsequent encounter: Secondary | ICD-10-CM | POA: Diagnosis not present

## 2020-09-02 DIAGNOSIS — G20 Parkinson's disease: Secondary | ICD-10-CM | POA: Diagnosis not present

## 2020-09-08 DIAGNOSIS — R441 Visual hallucinations: Secondary | ICD-10-CM | POA: Diagnosis not present

## 2020-09-08 DIAGNOSIS — G20 Parkinson's disease: Secondary | ICD-10-CM | POA: Diagnosis not present

## 2020-09-08 DIAGNOSIS — Z515 Encounter for palliative care: Secondary | ICD-10-CM | POA: Diagnosis not present

## 2020-09-11 DIAGNOSIS — Z741 Need for assistance with personal care: Secondary | ICD-10-CM | POA: Diagnosis not present

## 2020-09-11 DIAGNOSIS — C4442 Squamous cell carcinoma of skin of scalp and neck: Secondary | ICD-10-CM | POA: Diagnosis not present

## 2020-09-11 DIAGNOSIS — Z9181 History of falling: Secondary | ICD-10-CM | POA: Diagnosis not present

## 2020-09-11 DIAGNOSIS — S40811D Abrasion of right upper arm, subsequent encounter: Secondary | ICD-10-CM | POA: Diagnosis not present

## 2020-09-11 DIAGNOSIS — R531 Weakness: Secondary | ICD-10-CM | POA: Diagnosis not present

## 2020-09-11 DIAGNOSIS — G20 Parkinson's disease: Secondary | ICD-10-CM | POA: Diagnosis not present

## 2020-09-11 DIAGNOSIS — S50812D Abrasion of left forearm, subsequent encounter: Secondary | ICD-10-CM | POA: Diagnosis not present

## 2020-09-15 DIAGNOSIS — Z741 Need for assistance with personal care: Secondary | ICD-10-CM | POA: Diagnosis not present

## 2020-09-15 DIAGNOSIS — S40811D Abrasion of right upper arm, subsequent encounter: Secondary | ICD-10-CM | POA: Diagnosis not present

## 2020-09-15 DIAGNOSIS — C4442 Squamous cell carcinoma of skin of scalp and neck: Secondary | ICD-10-CM | POA: Diagnosis not present

## 2020-09-15 DIAGNOSIS — R531 Weakness: Secondary | ICD-10-CM | POA: Diagnosis not present

## 2020-09-15 DIAGNOSIS — S50812D Abrasion of left forearm, subsequent encounter: Secondary | ICD-10-CM | POA: Diagnosis not present

## 2020-09-15 DIAGNOSIS — G20 Parkinson's disease: Secondary | ICD-10-CM | POA: Diagnosis not present

## 2020-09-19 DIAGNOSIS — G20 Parkinson's disease: Secondary | ICD-10-CM | POA: Diagnosis not present

## 2020-09-19 DIAGNOSIS — Z9181 History of falling: Secondary | ICD-10-CM | POA: Diagnosis not present

## 2020-09-19 DIAGNOSIS — R531 Weakness: Secondary | ICD-10-CM | POA: Diagnosis not present

## 2020-09-19 DIAGNOSIS — Z950 Presence of cardiac pacemaker: Secondary | ICD-10-CM | POA: Diagnosis not present

## 2020-09-19 DIAGNOSIS — L89151 Pressure ulcer of sacral region, stage 1: Secondary | ICD-10-CM | POA: Diagnosis not present

## 2020-09-19 DIAGNOSIS — I4891 Unspecified atrial fibrillation: Secondary | ICD-10-CM | POA: Diagnosis not present

## 2020-09-19 DIAGNOSIS — E785 Hyperlipidemia, unspecified: Secondary | ICD-10-CM | POA: Diagnosis not present

## 2020-09-22 DIAGNOSIS — Z950 Presence of cardiac pacemaker: Secondary | ICD-10-CM | POA: Diagnosis not present

## 2020-09-22 DIAGNOSIS — I4891 Unspecified atrial fibrillation: Secondary | ICD-10-CM | POA: Diagnosis not present

## 2020-09-22 DIAGNOSIS — Z9181 History of falling: Secondary | ICD-10-CM | POA: Diagnosis not present

## 2020-09-22 DIAGNOSIS — R531 Weakness: Secondary | ICD-10-CM | POA: Diagnosis not present

## 2020-09-22 DIAGNOSIS — E785 Hyperlipidemia, unspecified: Secondary | ICD-10-CM | POA: Diagnosis not present

## 2020-09-22 DIAGNOSIS — G20 Parkinson's disease: Secondary | ICD-10-CM | POA: Diagnosis not present

## 2020-09-23 DIAGNOSIS — R531 Weakness: Secondary | ICD-10-CM | POA: Diagnosis not present

## 2020-09-23 DIAGNOSIS — Z9181 History of falling: Secondary | ICD-10-CM | POA: Diagnosis not present

## 2020-09-23 DIAGNOSIS — I4891 Unspecified atrial fibrillation: Secondary | ICD-10-CM | POA: Diagnosis not present

## 2020-09-23 DIAGNOSIS — G20 Parkinson's disease: Secondary | ICD-10-CM | POA: Diagnosis not present

## 2020-09-23 DIAGNOSIS — Z950 Presence of cardiac pacemaker: Secondary | ICD-10-CM | POA: Diagnosis not present

## 2020-09-23 DIAGNOSIS — E785 Hyperlipidemia, unspecified: Secondary | ICD-10-CM | POA: Diagnosis not present

## 2020-09-24 ENCOUNTER — Ambulatory Visit: Payer: Medicare Other | Admitting: Neurology

## 2020-09-24 DIAGNOSIS — G20 Parkinson's disease: Secondary | ICD-10-CM | POA: Diagnosis not present

## 2020-09-24 DIAGNOSIS — R531 Weakness: Secondary | ICD-10-CM | POA: Diagnosis not present

## 2020-09-24 DIAGNOSIS — E785 Hyperlipidemia, unspecified: Secondary | ICD-10-CM | POA: Diagnosis not present

## 2020-09-24 DIAGNOSIS — Z950 Presence of cardiac pacemaker: Secondary | ICD-10-CM | POA: Diagnosis not present

## 2020-09-24 DIAGNOSIS — I4891 Unspecified atrial fibrillation: Secondary | ICD-10-CM | POA: Diagnosis not present

## 2020-09-24 DIAGNOSIS — L89151 Pressure ulcer of sacral region, stage 1: Secondary | ICD-10-CM | POA: Diagnosis not present

## 2020-09-24 DIAGNOSIS — Z9181 History of falling: Secondary | ICD-10-CM | POA: Diagnosis not present

## 2020-09-25 DIAGNOSIS — R531 Weakness: Secondary | ICD-10-CM | POA: Diagnosis not present

## 2020-09-25 DIAGNOSIS — Z950 Presence of cardiac pacemaker: Secondary | ICD-10-CM | POA: Diagnosis not present

## 2020-09-25 DIAGNOSIS — Z9181 History of falling: Secondary | ICD-10-CM | POA: Diagnosis not present

## 2020-09-25 DIAGNOSIS — G20 Parkinson's disease: Secondary | ICD-10-CM | POA: Diagnosis not present

## 2020-09-25 DIAGNOSIS — E785 Hyperlipidemia, unspecified: Secondary | ICD-10-CM | POA: Diagnosis not present

## 2020-09-25 DIAGNOSIS — I4891 Unspecified atrial fibrillation: Secondary | ICD-10-CM | POA: Diagnosis not present

## 2020-09-29 DIAGNOSIS — Z9181 History of falling: Secondary | ICD-10-CM | POA: Diagnosis not present

## 2020-09-29 DIAGNOSIS — E785 Hyperlipidemia, unspecified: Secondary | ICD-10-CM | POA: Diagnosis not present

## 2020-09-29 DIAGNOSIS — I4891 Unspecified atrial fibrillation: Secondary | ICD-10-CM | POA: Diagnosis not present

## 2020-09-29 DIAGNOSIS — R531 Weakness: Secondary | ICD-10-CM | POA: Diagnosis not present

## 2020-09-29 DIAGNOSIS — G20 Parkinson's disease: Secondary | ICD-10-CM | POA: Diagnosis not present

## 2020-09-29 DIAGNOSIS — Z950 Presence of cardiac pacemaker: Secondary | ICD-10-CM | POA: Diagnosis not present

## 2020-09-30 DIAGNOSIS — Z950 Presence of cardiac pacemaker: Secondary | ICD-10-CM | POA: Diagnosis not present

## 2020-09-30 DIAGNOSIS — E785 Hyperlipidemia, unspecified: Secondary | ICD-10-CM | POA: Diagnosis not present

## 2020-09-30 DIAGNOSIS — Z9181 History of falling: Secondary | ICD-10-CM | POA: Diagnosis not present

## 2020-09-30 DIAGNOSIS — R531 Weakness: Secondary | ICD-10-CM | POA: Diagnosis not present

## 2020-09-30 DIAGNOSIS — G20 Parkinson's disease: Secondary | ICD-10-CM | POA: Diagnosis not present

## 2020-09-30 DIAGNOSIS — I4891 Unspecified atrial fibrillation: Secondary | ICD-10-CM | POA: Diagnosis not present

## 2020-10-02 DIAGNOSIS — Z950 Presence of cardiac pacemaker: Secondary | ICD-10-CM | POA: Diagnosis not present

## 2020-10-02 DIAGNOSIS — E785 Hyperlipidemia, unspecified: Secondary | ICD-10-CM | POA: Diagnosis not present

## 2020-10-02 DIAGNOSIS — R531 Weakness: Secondary | ICD-10-CM | POA: Diagnosis not present

## 2020-10-02 DIAGNOSIS — I4891 Unspecified atrial fibrillation: Secondary | ICD-10-CM | POA: Diagnosis not present

## 2020-10-02 DIAGNOSIS — G20 Parkinson's disease: Secondary | ICD-10-CM | POA: Diagnosis not present

## 2020-10-02 DIAGNOSIS — Z9181 History of falling: Secondary | ICD-10-CM | POA: Diagnosis not present

## 2020-10-05 DIAGNOSIS — Z9181 History of falling: Secondary | ICD-10-CM | POA: Diagnosis not present

## 2020-10-05 DIAGNOSIS — G20 Parkinson's disease: Secondary | ICD-10-CM | POA: Diagnosis not present

## 2020-10-05 DIAGNOSIS — R531 Weakness: Secondary | ICD-10-CM | POA: Diagnosis not present

## 2020-10-05 DIAGNOSIS — I4891 Unspecified atrial fibrillation: Secondary | ICD-10-CM | POA: Diagnosis not present

## 2020-10-05 DIAGNOSIS — E785 Hyperlipidemia, unspecified: Secondary | ICD-10-CM | POA: Diagnosis not present

## 2020-10-05 DIAGNOSIS — Z950 Presence of cardiac pacemaker: Secondary | ICD-10-CM | POA: Diagnosis not present

## 2020-10-07 DIAGNOSIS — G20 Parkinson's disease: Secondary | ICD-10-CM | POA: Diagnosis not present

## 2020-10-07 DIAGNOSIS — E785 Hyperlipidemia, unspecified: Secondary | ICD-10-CM | POA: Diagnosis not present

## 2020-10-07 DIAGNOSIS — R531 Weakness: Secondary | ICD-10-CM | POA: Diagnosis not present

## 2020-10-07 DIAGNOSIS — Z9181 History of falling: Secondary | ICD-10-CM | POA: Diagnosis not present

## 2020-10-07 DIAGNOSIS — Z950 Presence of cardiac pacemaker: Secondary | ICD-10-CM | POA: Diagnosis not present

## 2020-10-07 DIAGNOSIS — I4891 Unspecified atrial fibrillation: Secondary | ICD-10-CM | POA: Diagnosis not present

## 2020-10-09 DIAGNOSIS — I4891 Unspecified atrial fibrillation: Secondary | ICD-10-CM | POA: Diagnosis not present

## 2020-10-09 DIAGNOSIS — E785 Hyperlipidemia, unspecified: Secondary | ICD-10-CM | POA: Diagnosis not present

## 2020-10-09 DIAGNOSIS — R531 Weakness: Secondary | ICD-10-CM | POA: Diagnosis not present

## 2020-10-09 DIAGNOSIS — G20 Parkinson's disease: Secondary | ICD-10-CM | POA: Diagnosis not present

## 2020-10-09 DIAGNOSIS — Z950 Presence of cardiac pacemaker: Secondary | ICD-10-CM | POA: Diagnosis not present

## 2020-10-09 DIAGNOSIS — Z9181 History of falling: Secondary | ICD-10-CM | POA: Diagnosis not present

## 2020-10-12 DIAGNOSIS — E785 Hyperlipidemia, unspecified: Secondary | ICD-10-CM | POA: Diagnosis not present

## 2020-10-12 DIAGNOSIS — Z950 Presence of cardiac pacemaker: Secondary | ICD-10-CM | POA: Diagnosis not present

## 2020-10-12 DIAGNOSIS — Z9181 History of falling: Secondary | ICD-10-CM | POA: Diagnosis not present

## 2020-10-12 DIAGNOSIS — R531 Weakness: Secondary | ICD-10-CM | POA: Diagnosis not present

## 2020-10-12 DIAGNOSIS — I4891 Unspecified atrial fibrillation: Secondary | ICD-10-CM | POA: Diagnosis not present

## 2020-10-12 DIAGNOSIS — G20 Parkinson's disease: Secondary | ICD-10-CM | POA: Diagnosis not present

## 2020-10-14 DIAGNOSIS — R531 Weakness: Secondary | ICD-10-CM | POA: Diagnosis not present

## 2020-10-14 DIAGNOSIS — I4891 Unspecified atrial fibrillation: Secondary | ICD-10-CM | POA: Diagnosis not present

## 2020-10-14 DIAGNOSIS — Z9181 History of falling: Secondary | ICD-10-CM | POA: Diagnosis not present

## 2020-10-14 DIAGNOSIS — G20 Parkinson's disease: Secondary | ICD-10-CM | POA: Diagnosis not present

## 2020-10-14 DIAGNOSIS — E785 Hyperlipidemia, unspecified: Secondary | ICD-10-CM | POA: Diagnosis not present

## 2020-10-14 DIAGNOSIS — Z950 Presence of cardiac pacemaker: Secondary | ICD-10-CM | POA: Diagnosis not present

## 2020-10-15 ENCOUNTER — Ambulatory Visit (INDEPENDENT_AMBULATORY_CARE_PROVIDER_SITE_OTHER): Payer: Medicare Other

## 2020-10-15 DIAGNOSIS — I495 Sick sinus syndrome: Secondary | ICD-10-CM | POA: Diagnosis not present

## 2020-10-16 DIAGNOSIS — Z950 Presence of cardiac pacemaker: Secondary | ICD-10-CM | POA: Diagnosis not present

## 2020-10-16 DIAGNOSIS — G20 Parkinson's disease: Secondary | ICD-10-CM | POA: Diagnosis not present

## 2020-10-16 DIAGNOSIS — R531 Weakness: Secondary | ICD-10-CM | POA: Diagnosis not present

## 2020-10-16 DIAGNOSIS — I4891 Unspecified atrial fibrillation: Secondary | ICD-10-CM | POA: Diagnosis not present

## 2020-10-16 DIAGNOSIS — Z9181 History of falling: Secondary | ICD-10-CM | POA: Diagnosis not present

## 2020-10-16 DIAGNOSIS — E785 Hyperlipidemia, unspecified: Secondary | ICD-10-CM | POA: Diagnosis not present

## 2020-10-16 LAB — CUP PACEART REMOTE DEVICE CHECK
Battery Impedance: 1118 Ohm
Battery Remaining Longevity: 67 mo
Battery Voltage: 2.77 V
Brady Statistic AP VP Percent: 0 %
Brady Statistic AP VS Percent: 75 %
Brady Statistic AS VP Percent: 0 %
Brady Statistic AS VS Percent: 25 %
Date Time Interrogation Session: 20220922150056
Implantable Lead Implant Date: 20130611
Implantable Lead Implant Date: 20130611
Implantable Lead Location: 753859
Implantable Lead Location: 753860
Implantable Lead Model: 5076
Implantable Lead Model: 5076
Implantable Pulse Generator Implant Date: 20130611
Lead Channel Impedance Value: 408 Ohm
Lead Channel Impedance Value: 544 Ohm
Lead Channel Pacing Threshold Amplitude: 0.625 V
Lead Channel Pacing Threshold Amplitude: 0.875 V
Lead Channel Pacing Threshold Pulse Width: 0.4 ms
Lead Channel Pacing Threshold Pulse Width: 0.4 ms
Lead Channel Setting Pacing Amplitude: 1.5 V
Lead Channel Setting Pacing Amplitude: 2 V
Lead Channel Setting Pacing Pulse Width: 0.4 ms
Lead Channel Setting Sensing Sensitivity: 4 mV

## 2020-10-19 DIAGNOSIS — R531 Weakness: Secondary | ICD-10-CM | POA: Diagnosis not present

## 2020-10-19 DIAGNOSIS — Z950 Presence of cardiac pacemaker: Secondary | ICD-10-CM | POA: Diagnosis not present

## 2020-10-19 DIAGNOSIS — G20 Parkinson's disease: Secondary | ICD-10-CM | POA: Diagnosis not present

## 2020-10-19 DIAGNOSIS — I4891 Unspecified atrial fibrillation: Secondary | ICD-10-CM | POA: Diagnosis not present

## 2020-10-19 DIAGNOSIS — E785 Hyperlipidemia, unspecified: Secondary | ICD-10-CM | POA: Diagnosis not present

## 2020-10-19 DIAGNOSIS — Z9181 History of falling: Secondary | ICD-10-CM | POA: Diagnosis not present

## 2020-10-21 DIAGNOSIS — R531 Weakness: Secondary | ICD-10-CM | POA: Diagnosis not present

## 2020-10-21 DIAGNOSIS — Z950 Presence of cardiac pacemaker: Secondary | ICD-10-CM | POA: Diagnosis not present

## 2020-10-21 DIAGNOSIS — Z9181 History of falling: Secondary | ICD-10-CM | POA: Diagnosis not present

## 2020-10-21 DIAGNOSIS — G20 Parkinson's disease: Secondary | ICD-10-CM | POA: Diagnosis not present

## 2020-10-21 DIAGNOSIS — I4891 Unspecified atrial fibrillation: Secondary | ICD-10-CM | POA: Diagnosis not present

## 2020-10-21 DIAGNOSIS — E785 Hyperlipidemia, unspecified: Secondary | ICD-10-CM | POA: Diagnosis not present

## 2020-10-21 NOTE — Progress Notes (Signed)
Remote pacemaker transmission.   

## 2020-10-23 DIAGNOSIS — Z950 Presence of cardiac pacemaker: Secondary | ICD-10-CM | POA: Diagnosis not present

## 2020-10-23 DIAGNOSIS — Z9181 History of falling: Secondary | ICD-10-CM | POA: Diagnosis not present

## 2020-10-23 DIAGNOSIS — E785 Hyperlipidemia, unspecified: Secondary | ICD-10-CM | POA: Diagnosis not present

## 2020-10-23 DIAGNOSIS — G20 Parkinson's disease: Secondary | ICD-10-CM | POA: Diagnosis not present

## 2020-10-23 DIAGNOSIS — R531 Weakness: Secondary | ICD-10-CM | POA: Diagnosis not present

## 2020-10-23 DIAGNOSIS — I4891 Unspecified atrial fibrillation: Secondary | ICD-10-CM | POA: Diagnosis not present

## 2020-10-24 DIAGNOSIS — R531 Weakness: Secondary | ICD-10-CM | POA: Diagnosis not present

## 2020-10-24 DIAGNOSIS — E785 Hyperlipidemia, unspecified: Secondary | ICD-10-CM | POA: Diagnosis not present

## 2020-10-24 DIAGNOSIS — G20 Parkinson's disease: Secondary | ICD-10-CM | POA: Diagnosis not present

## 2020-10-24 DIAGNOSIS — Z9181 History of falling: Secondary | ICD-10-CM | POA: Diagnosis not present

## 2020-10-24 DIAGNOSIS — L89151 Pressure ulcer of sacral region, stage 1: Secondary | ICD-10-CM | POA: Diagnosis not present

## 2020-10-24 DIAGNOSIS — Z950 Presence of cardiac pacemaker: Secondary | ICD-10-CM | POA: Diagnosis not present

## 2020-10-24 DIAGNOSIS — I4891 Unspecified atrial fibrillation: Secondary | ICD-10-CM | POA: Diagnosis not present

## 2020-10-26 DIAGNOSIS — Z9181 History of falling: Secondary | ICD-10-CM | POA: Diagnosis not present

## 2020-10-26 DIAGNOSIS — I4891 Unspecified atrial fibrillation: Secondary | ICD-10-CM | POA: Diagnosis not present

## 2020-10-26 DIAGNOSIS — Z950 Presence of cardiac pacemaker: Secondary | ICD-10-CM | POA: Diagnosis not present

## 2020-10-26 DIAGNOSIS — R531 Weakness: Secondary | ICD-10-CM | POA: Diagnosis not present

## 2020-10-26 DIAGNOSIS — G20 Parkinson's disease: Secondary | ICD-10-CM | POA: Diagnosis not present

## 2020-10-26 DIAGNOSIS — E785 Hyperlipidemia, unspecified: Secondary | ICD-10-CM | POA: Diagnosis not present

## 2020-10-28 DIAGNOSIS — I4891 Unspecified atrial fibrillation: Secondary | ICD-10-CM | POA: Diagnosis not present

## 2020-10-28 DIAGNOSIS — E785 Hyperlipidemia, unspecified: Secondary | ICD-10-CM | POA: Diagnosis not present

## 2020-10-28 DIAGNOSIS — Z9181 History of falling: Secondary | ICD-10-CM | POA: Diagnosis not present

## 2020-10-28 DIAGNOSIS — Z950 Presence of cardiac pacemaker: Secondary | ICD-10-CM | POA: Diagnosis not present

## 2020-10-28 DIAGNOSIS — G20 Parkinson's disease: Secondary | ICD-10-CM | POA: Diagnosis not present

## 2020-10-28 DIAGNOSIS — R531 Weakness: Secondary | ICD-10-CM | POA: Diagnosis not present

## 2020-10-30 DIAGNOSIS — E785 Hyperlipidemia, unspecified: Secondary | ICD-10-CM | POA: Diagnosis not present

## 2020-10-30 DIAGNOSIS — I4891 Unspecified atrial fibrillation: Secondary | ICD-10-CM | POA: Diagnosis not present

## 2020-10-30 DIAGNOSIS — G20 Parkinson's disease: Secondary | ICD-10-CM | POA: Diagnosis not present

## 2020-10-30 DIAGNOSIS — Z950 Presence of cardiac pacemaker: Secondary | ICD-10-CM | POA: Diagnosis not present

## 2020-10-30 DIAGNOSIS — Z9181 History of falling: Secondary | ICD-10-CM | POA: Diagnosis not present

## 2020-10-30 DIAGNOSIS — R531 Weakness: Secondary | ICD-10-CM | POA: Diagnosis not present

## 2020-11-02 DIAGNOSIS — Z950 Presence of cardiac pacemaker: Secondary | ICD-10-CM | POA: Diagnosis not present

## 2020-11-02 DIAGNOSIS — E785 Hyperlipidemia, unspecified: Secondary | ICD-10-CM | POA: Diagnosis not present

## 2020-11-02 DIAGNOSIS — Z9181 History of falling: Secondary | ICD-10-CM | POA: Diagnosis not present

## 2020-11-02 DIAGNOSIS — G20 Parkinson's disease: Secondary | ICD-10-CM | POA: Diagnosis not present

## 2020-11-02 DIAGNOSIS — R531 Weakness: Secondary | ICD-10-CM | POA: Diagnosis not present

## 2020-11-02 DIAGNOSIS — I4891 Unspecified atrial fibrillation: Secondary | ICD-10-CM | POA: Diagnosis not present

## 2020-11-04 DIAGNOSIS — G20 Parkinson's disease: Secondary | ICD-10-CM | POA: Diagnosis not present

## 2020-11-04 DIAGNOSIS — I4891 Unspecified atrial fibrillation: Secondary | ICD-10-CM | POA: Diagnosis not present

## 2020-11-04 DIAGNOSIS — Z950 Presence of cardiac pacemaker: Secondary | ICD-10-CM | POA: Diagnosis not present

## 2020-11-04 DIAGNOSIS — R531 Weakness: Secondary | ICD-10-CM | POA: Diagnosis not present

## 2020-11-04 DIAGNOSIS — Z9181 History of falling: Secondary | ICD-10-CM | POA: Diagnosis not present

## 2020-11-04 DIAGNOSIS — E785 Hyperlipidemia, unspecified: Secondary | ICD-10-CM | POA: Diagnosis not present

## 2020-11-06 DIAGNOSIS — R531 Weakness: Secondary | ICD-10-CM | POA: Diagnosis not present

## 2020-11-06 DIAGNOSIS — Z9181 History of falling: Secondary | ICD-10-CM | POA: Diagnosis not present

## 2020-11-06 DIAGNOSIS — E785 Hyperlipidemia, unspecified: Secondary | ICD-10-CM | POA: Diagnosis not present

## 2020-11-06 DIAGNOSIS — Z950 Presence of cardiac pacemaker: Secondary | ICD-10-CM | POA: Diagnosis not present

## 2020-11-06 DIAGNOSIS — G20 Parkinson's disease: Secondary | ICD-10-CM | POA: Diagnosis not present

## 2020-11-06 DIAGNOSIS — I4891 Unspecified atrial fibrillation: Secondary | ICD-10-CM | POA: Diagnosis not present

## 2020-11-09 DIAGNOSIS — G20 Parkinson's disease: Secondary | ICD-10-CM | POA: Diagnosis not present

## 2020-11-09 DIAGNOSIS — Z9181 History of falling: Secondary | ICD-10-CM | POA: Diagnosis not present

## 2020-11-09 DIAGNOSIS — I4891 Unspecified atrial fibrillation: Secondary | ICD-10-CM | POA: Diagnosis not present

## 2020-11-09 DIAGNOSIS — E785 Hyperlipidemia, unspecified: Secondary | ICD-10-CM | POA: Diagnosis not present

## 2020-11-09 DIAGNOSIS — R531 Weakness: Secondary | ICD-10-CM | POA: Diagnosis not present

## 2020-11-09 DIAGNOSIS — Z950 Presence of cardiac pacemaker: Secondary | ICD-10-CM | POA: Diagnosis not present

## 2020-11-10 DIAGNOSIS — Z23 Encounter for immunization: Secondary | ICD-10-CM | POA: Diagnosis not present

## 2020-11-11 DIAGNOSIS — Z9181 History of falling: Secondary | ICD-10-CM | POA: Diagnosis not present

## 2020-11-11 DIAGNOSIS — G20 Parkinson's disease: Secondary | ICD-10-CM | POA: Diagnosis not present

## 2020-11-11 DIAGNOSIS — I4891 Unspecified atrial fibrillation: Secondary | ICD-10-CM | POA: Diagnosis not present

## 2020-11-11 DIAGNOSIS — R531 Weakness: Secondary | ICD-10-CM | POA: Diagnosis not present

## 2020-11-11 DIAGNOSIS — E785 Hyperlipidemia, unspecified: Secondary | ICD-10-CM | POA: Diagnosis not present

## 2020-11-11 DIAGNOSIS — Z950 Presence of cardiac pacemaker: Secondary | ICD-10-CM | POA: Diagnosis not present

## 2020-11-13 DIAGNOSIS — E785 Hyperlipidemia, unspecified: Secondary | ICD-10-CM | POA: Diagnosis not present

## 2020-11-13 DIAGNOSIS — R531 Weakness: Secondary | ICD-10-CM | POA: Diagnosis not present

## 2020-11-13 DIAGNOSIS — Z9181 History of falling: Secondary | ICD-10-CM | POA: Diagnosis not present

## 2020-11-13 DIAGNOSIS — I4891 Unspecified atrial fibrillation: Secondary | ICD-10-CM | POA: Diagnosis not present

## 2020-11-13 DIAGNOSIS — G20 Parkinson's disease: Secondary | ICD-10-CM | POA: Diagnosis not present

## 2020-11-13 DIAGNOSIS — Z950 Presence of cardiac pacemaker: Secondary | ICD-10-CM | POA: Diagnosis not present

## 2020-11-14 ENCOUNTER — Other Ambulatory Visit: Payer: Self-pay | Admitting: Cardiovascular Disease

## 2020-11-16 DIAGNOSIS — Z950 Presence of cardiac pacemaker: Secondary | ICD-10-CM | POA: Diagnosis not present

## 2020-11-16 DIAGNOSIS — E785 Hyperlipidemia, unspecified: Secondary | ICD-10-CM | POA: Diagnosis not present

## 2020-11-16 DIAGNOSIS — Z9181 History of falling: Secondary | ICD-10-CM | POA: Diagnosis not present

## 2020-11-16 DIAGNOSIS — G20 Parkinson's disease: Secondary | ICD-10-CM | POA: Diagnosis not present

## 2020-11-16 DIAGNOSIS — I4891 Unspecified atrial fibrillation: Secondary | ICD-10-CM | POA: Diagnosis not present

## 2020-11-16 DIAGNOSIS — R531 Weakness: Secondary | ICD-10-CM | POA: Diagnosis not present

## 2020-11-18 DIAGNOSIS — E785 Hyperlipidemia, unspecified: Secondary | ICD-10-CM | POA: Diagnosis not present

## 2020-11-18 DIAGNOSIS — I4891 Unspecified atrial fibrillation: Secondary | ICD-10-CM | POA: Diagnosis not present

## 2020-11-18 DIAGNOSIS — Z950 Presence of cardiac pacemaker: Secondary | ICD-10-CM | POA: Diagnosis not present

## 2020-11-18 DIAGNOSIS — G20 Parkinson's disease: Secondary | ICD-10-CM | POA: Diagnosis not present

## 2020-11-18 DIAGNOSIS — R531 Weakness: Secondary | ICD-10-CM | POA: Diagnosis not present

## 2020-11-18 DIAGNOSIS — Z9181 History of falling: Secondary | ICD-10-CM | POA: Diagnosis not present

## 2020-11-20 DIAGNOSIS — G20 Parkinson's disease: Secondary | ICD-10-CM | POA: Diagnosis not present

## 2020-11-20 DIAGNOSIS — E785 Hyperlipidemia, unspecified: Secondary | ICD-10-CM | POA: Diagnosis not present

## 2020-11-20 DIAGNOSIS — R531 Weakness: Secondary | ICD-10-CM | POA: Diagnosis not present

## 2020-11-20 DIAGNOSIS — I4891 Unspecified atrial fibrillation: Secondary | ICD-10-CM | POA: Diagnosis not present

## 2020-11-20 DIAGNOSIS — Z950 Presence of cardiac pacemaker: Secondary | ICD-10-CM | POA: Diagnosis not present

## 2020-11-20 DIAGNOSIS — Z9181 History of falling: Secondary | ICD-10-CM | POA: Diagnosis not present

## 2020-11-23 DIAGNOSIS — R531 Weakness: Secondary | ICD-10-CM | POA: Diagnosis not present

## 2020-11-23 DIAGNOSIS — E785 Hyperlipidemia, unspecified: Secondary | ICD-10-CM | POA: Diagnosis not present

## 2020-11-23 DIAGNOSIS — G20 Parkinson's disease: Secondary | ICD-10-CM | POA: Diagnosis not present

## 2020-11-23 DIAGNOSIS — Z950 Presence of cardiac pacemaker: Secondary | ICD-10-CM | POA: Diagnosis not present

## 2020-11-23 DIAGNOSIS — Z9181 History of falling: Secondary | ICD-10-CM | POA: Diagnosis not present

## 2020-11-23 DIAGNOSIS — I4891 Unspecified atrial fibrillation: Secondary | ICD-10-CM | POA: Diagnosis not present

## 2020-11-24 DIAGNOSIS — G20 Parkinson's disease: Secondary | ICD-10-CM | POA: Diagnosis not present

## 2020-11-24 DIAGNOSIS — R531 Weakness: Secondary | ICD-10-CM | POA: Diagnosis not present

## 2020-11-24 DIAGNOSIS — L89151 Pressure ulcer of sacral region, stage 1: Secondary | ICD-10-CM | POA: Diagnosis not present

## 2020-11-24 DIAGNOSIS — Z950 Presence of cardiac pacemaker: Secondary | ICD-10-CM | POA: Diagnosis not present

## 2020-11-24 DIAGNOSIS — I4891 Unspecified atrial fibrillation: Secondary | ICD-10-CM | POA: Diagnosis not present

## 2020-11-24 DIAGNOSIS — Z9181 History of falling: Secondary | ICD-10-CM | POA: Diagnosis not present

## 2020-11-24 DIAGNOSIS — E785 Hyperlipidemia, unspecified: Secondary | ICD-10-CM | POA: Diagnosis not present

## 2020-11-25 DIAGNOSIS — I4891 Unspecified atrial fibrillation: Secondary | ICD-10-CM | POA: Diagnosis not present

## 2020-11-25 DIAGNOSIS — G20 Parkinson's disease: Secondary | ICD-10-CM | POA: Diagnosis not present

## 2020-11-25 DIAGNOSIS — Z9181 History of falling: Secondary | ICD-10-CM | POA: Diagnosis not present

## 2020-11-25 DIAGNOSIS — E785 Hyperlipidemia, unspecified: Secondary | ICD-10-CM | POA: Diagnosis not present

## 2020-11-25 DIAGNOSIS — R531 Weakness: Secondary | ICD-10-CM | POA: Diagnosis not present

## 2020-11-25 DIAGNOSIS — Z950 Presence of cardiac pacemaker: Secondary | ICD-10-CM | POA: Diagnosis not present

## 2020-11-27 DIAGNOSIS — Z9181 History of falling: Secondary | ICD-10-CM | POA: Diagnosis not present

## 2020-11-27 DIAGNOSIS — E785 Hyperlipidemia, unspecified: Secondary | ICD-10-CM | POA: Diagnosis not present

## 2020-11-27 DIAGNOSIS — Z950 Presence of cardiac pacemaker: Secondary | ICD-10-CM | POA: Diagnosis not present

## 2020-11-27 DIAGNOSIS — I4891 Unspecified atrial fibrillation: Secondary | ICD-10-CM | POA: Diagnosis not present

## 2020-11-27 DIAGNOSIS — R531 Weakness: Secondary | ICD-10-CM | POA: Diagnosis not present

## 2020-11-27 DIAGNOSIS — G20 Parkinson's disease: Secondary | ICD-10-CM | POA: Diagnosis not present

## 2020-11-30 DIAGNOSIS — Z9181 History of falling: Secondary | ICD-10-CM | POA: Diagnosis not present

## 2020-11-30 DIAGNOSIS — Z950 Presence of cardiac pacemaker: Secondary | ICD-10-CM | POA: Diagnosis not present

## 2020-11-30 DIAGNOSIS — G20 Parkinson's disease: Secondary | ICD-10-CM | POA: Diagnosis not present

## 2020-11-30 DIAGNOSIS — R531 Weakness: Secondary | ICD-10-CM | POA: Diagnosis not present

## 2020-11-30 DIAGNOSIS — E785 Hyperlipidemia, unspecified: Secondary | ICD-10-CM | POA: Diagnosis not present

## 2020-11-30 DIAGNOSIS — I4891 Unspecified atrial fibrillation: Secondary | ICD-10-CM | POA: Diagnosis not present

## 2020-12-02 DIAGNOSIS — Z9181 History of falling: Secondary | ICD-10-CM | POA: Diagnosis not present

## 2020-12-02 DIAGNOSIS — Z950 Presence of cardiac pacemaker: Secondary | ICD-10-CM | POA: Diagnosis not present

## 2020-12-02 DIAGNOSIS — R531 Weakness: Secondary | ICD-10-CM | POA: Diagnosis not present

## 2020-12-02 DIAGNOSIS — E785 Hyperlipidemia, unspecified: Secondary | ICD-10-CM | POA: Diagnosis not present

## 2020-12-02 DIAGNOSIS — G20 Parkinson's disease: Secondary | ICD-10-CM | POA: Diagnosis not present

## 2020-12-02 DIAGNOSIS — I4891 Unspecified atrial fibrillation: Secondary | ICD-10-CM | POA: Diagnosis not present

## 2020-12-04 DIAGNOSIS — I4891 Unspecified atrial fibrillation: Secondary | ICD-10-CM | POA: Diagnosis not present

## 2020-12-04 DIAGNOSIS — E785 Hyperlipidemia, unspecified: Secondary | ICD-10-CM | POA: Diagnosis not present

## 2020-12-04 DIAGNOSIS — Z9181 History of falling: Secondary | ICD-10-CM | POA: Diagnosis not present

## 2020-12-04 DIAGNOSIS — Z950 Presence of cardiac pacemaker: Secondary | ICD-10-CM | POA: Diagnosis not present

## 2020-12-04 DIAGNOSIS — R531 Weakness: Secondary | ICD-10-CM | POA: Diagnosis not present

## 2020-12-04 DIAGNOSIS — G20 Parkinson's disease: Secondary | ICD-10-CM | POA: Diagnosis not present

## 2020-12-07 DIAGNOSIS — Z9181 History of falling: Secondary | ICD-10-CM | POA: Diagnosis not present

## 2020-12-07 DIAGNOSIS — E785 Hyperlipidemia, unspecified: Secondary | ICD-10-CM | POA: Diagnosis not present

## 2020-12-07 DIAGNOSIS — I4891 Unspecified atrial fibrillation: Secondary | ICD-10-CM | POA: Diagnosis not present

## 2020-12-07 DIAGNOSIS — R531 Weakness: Secondary | ICD-10-CM | POA: Diagnosis not present

## 2020-12-07 DIAGNOSIS — Z950 Presence of cardiac pacemaker: Secondary | ICD-10-CM | POA: Diagnosis not present

## 2020-12-07 DIAGNOSIS — G20 Parkinson's disease: Secondary | ICD-10-CM | POA: Diagnosis not present

## 2020-12-09 DIAGNOSIS — Z9181 History of falling: Secondary | ICD-10-CM | POA: Diagnosis not present

## 2020-12-09 DIAGNOSIS — Z950 Presence of cardiac pacemaker: Secondary | ICD-10-CM | POA: Diagnosis not present

## 2020-12-09 DIAGNOSIS — G20 Parkinson's disease: Secondary | ICD-10-CM | POA: Diagnosis not present

## 2020-12-09 DIAGNOSIS — E785 Hyperlipidemia, unspecified: Secondary | ICD-10-CM | POA: Diagnosis not present

## 2020-12-09 DIAGNOSIS — R531 Weakness: Secondary | ICD-10-CM | POA: Diagnosis not present

## 2020-12-09 DIAGNOSIS — I4891 Unspecified atrial fibrillation: Secondary | ICD-10-CM | POA: Diagnosis not present

## 2020-12-11 ENCOUNTER — Other Ambulatory Visit: Payer: Self-pay | Admitting: Neurology

## 2020-12-11 DIAGNOSIS — Z9181 History of falling: Secondary | ICD-10-CM | POA: Diagnosis not present

## 2020-12-11 DIAGNOSIS — E785 Hyperlipidemia, unspecified: Secondary | ICD-10-CM | POA: Diagnosis not present

## 2020-12-11 DIAGNOSIS — Z950 Presence of cardiac pacemaker: Secondary | ICD-10-CM | POA: Diagnosis not present

## 2020-12-11 DIAGNOSIS — I4891 Unspecified atrial fibrillation: Secondary | ICD-10-CM | POA: Diagnosis not present

## 2020-12-11 DIAGNOSIS — G20 Parkinson's disease: Secondary | ICD-10-CM | POA: Diagnosis not present

## 2020-12-11 DIAGNOSIS — R531 Weakness: Secondary | ICD-10-CM | POA: Diagnosis not present

## 2020-12-14 DIAGNOSIS — E785 Hyperlipidemia, unspecified: Secondary | ICD-10-CM | POA: Diagnosis not present

## 2020-12-14 DIAGNOSIS — R531 Weakness: Secondary | ICD-10-CM | POA: Diagnosis not present

## 2020-12-14 DIAGNOSIS — G20 Parkinson's disease: Secondary | ICD-10-CM | POA: Diagnosis not present

## 2020-12-14 DIAGNOSIS — I4891 Unspecified atrial fibrillation: Secondary | ICD-10-CM | POA: Diagnosis not present

## 2020-12-14 DIAGNOSIS — Z950 Presence of cardiac pacemaker: Secondary | ICD-10-CM | POA: Diagnosis not present

## 2020-12-14 DIAGNOSIS — Z9181 History of falling: Secondary | ICD-10-CM | POA: Diagnosis not present

## 2020-12-15 DIAGNOSIS — Z9181 History of falling: Secondary | ICD-10-CM | POA: Diagnosis not present

## 2020-12-15 DIAGNOSIS — E785 Hyperlipidemia, unspecified: Secondary | ICD-10-CM | POA: Diagnosis not present

## 2020-12-15 DIAGNOSIS — Z950 Presence of cardiac pacemaker: Secondary | ICD-10-CM | POA: Diagnosis not present

## 2020-12-15 DIAGNOSIS — I4891 Unspecified atrial fibrillation: Secondary | ICD-10-CM | POA: Diagnosis not present

## 2020-12-15 DIAGNOSIS — G20 Parkinson's disease: Secondary | ICD-10-CM | POA: Diagnosis not present

## 2020-12-15 DIAGNOSIS — R531 Weakness: Secondary | ICD-10-CM | POA: Diagnosis not present

## 2020-12-16 DIAGNOSIS — Z950 Presence of cardiac pacemaker: Secondary | ICD-10-CM | POA: Diagnosis not present

## 2020-12-16 DIAGNOSIS — G20 Parkinson's disease: Secondary | ICD-10-CM | POA: Diagnosis not present

## 2020-12-16 DIAGNOSIS — I4891 Unspecified atrial fibrillation: Secondary | ICD-10-CM | POA: Diagnosis not present

## 2020-12-16 DIAGNOSIS — E785 Hyperlipidemia, unspecified: Secondary | ICD-10-CM | POA: Diagnosis not present

## 2020-12-16 DIAGNOSIS — R531 Weakness: Secondary | ICD-10-CM | POA: Diagnosis not present

## 2020-12-16 DIAGNOSIS — Z9181 History of falling: Secondary | ICD-10-CM | POA: Diagnosis not present

## 2020-12-18 DIAGNOSIS — E785 Hyperlipidemia, unspecified: Secondary | ICD-10-CM | POA: Diagnosis not present

## 2020-12-18 DIAGNOSIS — R531 Weakness: Secondary | ICD-10-CM | POA: Diagnosis not present

## 2020-12-18 DIAGNOSIS — G20 Parkinson's disease: Secondary | ICD-10-CM | POA: Diagnosis not present

## 2020-12-18 DIAGNOSIS — Z9181 History of falling: Secondary | ICD-10-CM | POA: Diagnosis not present

## 2020-12-18 DIAGNOSIS — I4891 Unspecified atrial fibrillation: Secondary | ICD-10-CM | POA: Diagnosis not present

## 2020-12-18 DIAGNOSIS — Z950 Presence of cardiac pacemaker: Secondary | ICD-10-CM | POA: Diagnosis not present

## 2020-12-21 DIAGNOSIS — E785 Hyperlipidemia, unspecified: Secondary | ICD-10-CM | POA: Diagnosis not present

## 2020-12-21 DIAGNOSIS — R531 Weakness: Secondary | ICD-10-CM | POA: Diagnosis not present

## 2020-12-21 DIAGNOSIS — Z950 Presence of cardiac pacemaker: Secondary | ICD-10-CM | POA: Diagnosis not present

## 2020-12-21 DIAGNOSIS — G20 Parkinson's disease: Secondary | ICD-10-CM | POA: Diagnosis not present

## 2020-12-21 DIAGNOSIS — I4891 Unspecified atrial fibrillation: Secondary | ICD-10-CM | POA: Diagnosis not present

## 2020-12-21 DIAGNOSIS — Z9181 History of falling: Secondary | ICD-10-CM | POA: Diagnosis not present

## 2020-12-23 DIAGNOSIS — G20 Parkinson's disease: Secondary | ICD-10-CM | POA: Diagnosis not present

## 2020-12-23 DIAGNOSIS — Z9181 History of falling: Secondary | ICD-10-CM | POA: Diagnosis not present

## 2020-12-23 DIAGNOSIS — R531 Weakness: Secondary | ICD-10-CM | POA: Diagnosis not present

## 2020-12-23 DIAGNOSIS — Z950 Presence of cardiac pacemaker: Secondary | ICD-10-CM | POA: Diagnosis not present

## 2020-12-23 DIAGNOSIS — E785 Hyperlipidemia, unspecified: Secondary | ICD-10-CM | POA: Diagnosis not present

## 2020-12-23 DIAGNOSIS — I4891 Unspecified atrial fibrillation: Secondary | ICD-10-CM | POA: Diagnosis not present

## 2020-12-24 DIAGNOSIS — Z950 Presence of cardiac pacemaker: Secondary | ICD-10-CM | POA: Diagnosis not present

## 2020-12-24 DIAGNOSIS — Z9181 History of falling: Secondary | ICD-10-CM | POA: Diagnosis not present

## 2020-12-24 DIAGNOSIS — R531 Weakness: Secondary | ICD-10-CM | POA: Diagnosis not present

## 2020-12-24 DIAGNOSIS — I4891 Unspecified atrial fibrillation: Secondary | ICD-10-CM | POA: Diagnosis not present

## 2020-12-24 DIAGNOSIS — E785 Hyperlipidemia, unspecified: Secondary | ICD-10-CM | POA: Diagnosis not present

## 2020-12-24 DIAGNOSIS — G20 Parkinson's disease: Secondary | ICD-10-CM | POA: Diagnosis not present

## 2020-12-24 DIAGNOSIS — L89151 Pressure ulcer of sacral region, stage 1: Secondary | ICD-10-CM | POA: Diagnosis not present

## 2020-12-25 DIAGNOSIS — E785 Hyperlipidemia, unspecified: Secondary | ICD-10-CM | POA: Diagnosis not present

## 2020-12-25 DIAGNOSIS — R531 Weakness: Secondary | ICD-10-CM | POA: Diagnosis not present

## 2020-12-25 DIAGNOSIS — I4891 Unspecified atrial fibrillation: Secondary | ICD-10-CM | POA: Diagnosis not present

## 2020-12-25 DIAGNOSIS — G20 Parkinson's disease: Secondary | ICD-10-CM | POA: Diagnosis not present

## 2020-12-25 DIAGNOSIS — Z950 Presence of cardiac pacemaker: Secondary | ICD-10-CM | POA: Diagnosis not present

## 2020-12-25 DIAGNOSIS — Z9181 History of falling: Secondary | ICD-10-CM | POA: Diagnosis not present

## 2020-12-28 DIAGNOSIS — Z9181 History of falling: Secondary | ICD-10-CM | POA: Diagnosis not present

## 2020-12-28 DIAGNOSIS — Z950 Presence of cardiac pacemaker: Secondary | ICD-10-CM | POA: Diagnosis not present

## 2020-12-28 DIAGNOSIS — R531 Weakness: Secondary | ICD-10-CM | POA: Diagnosis not present

## 2020-12-28 DIAGNOSIS — E785 Hyperlipidemia, unspecified: Secondary | ICD-10-CM | POA: Diagnosis not present

## 2020-12-28 DIAGNOSIS — G20 Parkinson's disease: Secondary | ICD-10-CM | POA: Diagnosis not present

## 2020-12-28 DIAGNOSIS — I4891 Unspecified atrial fibrillation: Secondary | ICD-10-CM | POA: Diagnosis not present

## 2020-12-30 DIAGNOSIS — G20 Parkinson's disease: Secondary | ICD-10-CM | POA: Diagnosis not present

## 2020-12-30 DIAGNOSIS — Z950 Presence of cardiac pacemaker: Secondary | ICD-10-CM | POA: Diagnosis not present

## 2020-12-30 DIAGNOSIS — E785 Hyperlipidemia, unspecified: Secondary | ICD-10-CM | POA: Diagnosis not present

## 2020-12-30 DIAGNOSIS — Z9181 History of falling: Secondary | ICD-10-CM | POA: Diagnosis not present

## 2020-12-30 DIAGNOSIS — I4891 Unspecified atrial fibrillation: Secondary | ICD-10-CM | POA: Diagnosis not present

## 2020-12-30 DIAGNOSIS — R531 Weakness: Secondary | ICD-10-CM | POA: Diagnosis not present

## 2021-01-01 DIAGNOSIS — I4891 Unspecified atrial fibrillation: Secondary | ICD-10-CM | POA: Diagnosis not present

## 2021-01-01 DIAGNOSIS — Z9181 History of falling: Secondary | ICD-10-CM | POA: Diagnosis not present

## 2021-01-01 DIAGNOSIS — Z950 Presence of cardiac pacemaker: Secondary | ICD-10-CM | POA: Diagnosis not present

## 2021-01-01 DIAGNOSIS — G20 Parkinson's disease: Secondary | ICD-10-CM | POA: Diagnosis not present

## 2021-01-01 DIAGNOSIS — R531 Weakness: Secondary | ICD-10-CM | POA: Diagnosis not present

## 2021-01-01 DIAGNOSIS — E785 Hyperlipidemia, unspecified: Secondary | ICD-10-CM | POA: Diagnosis not present

## 2021-01-04 DIAGNOSIS — E785 Hyperlipidemia, unspecified: Secondary | ICD-10-CM | POA: Diagnosis not present

## 2021-01-04 DIAGNOSIS — G20 Parkinson's disease: Secondary | ICD-10-CM | POA: Diagnosis not present

## 2021-01-04 DIAGNOSIS — R531 Weakness: Secondary | ICD-10-CM | POA: Diagnosis not present

## 2021-01-04 DIAGNOSIS — I4891 Unspecified atrial fibrillation: Secondary | ICD-10-CM | POA: Diagnosis not present

## 2021-01-04 DIAGNOSIS — Z9181 History of falling: Secondary | ICD-10-CM | POA: Diagnosis not present

## 2021-01-04 DIAGNOSIS — Z950 Presence of cardiac pacemaker: Secondary | ICD-10-CM | POA: Diagnosis not present

## 2021-01-06 DIAGNOSIS — Z9181 History of falling: Secondary | ICD-10-CM | POA: Diagnosis not present

## 2021-01-06 DIAGNOSIS — Z950 Presence of cardiac pacemaker: Secondary | ICD-10-CM | POA: Diagnosis not present

## 2021-01-06 DIAGNOSIS — R531 Weakness: Secondary | ICD-10-CM | POA: Diagnosis not present

## 2021-01-06 DIAGNOSIS — I4891 Unspecified atrial fibrillation: Secondary | ICD-10-CM | POA: Diagnosis not present

## 2021-01-06 DIAGNOSIS — E785 Hyperlipidemia, unspecified: Secondary | ICD-10-CM | POA: Diagnosis not present

## 2021-01-06 DIAGNOSIS — G20 Parkinson's disease: Secondary | ICD-10-CM | POA: Diagnosis not present

## 2021-01-08 DIAGNOSIS — R531 Weakness: Secondary | ICD-10-CM | POA: Diagnosis not present

## 2021-01-08 DIAGNOSIS — Z950 Presence of cardiac pacemaker: Secondary | ICD-10-CM | POA: Diagnosis not present

## 2021-01-08 DIAGNOSIS — Z9181 History of falling: Secondary | ICD-10-CM | POA: Diagnosis not present

## 2021-01-08 DIAGNOSIS — I4891 Unspecified atrial fibrillation: Secondary | ICD-10-CM | POA: Diagnosis not present

## 2021-01-08 DIAGNOSIS — G20 Parkinson's disease: Secondary | ICD-10-CM | POA: Diagnosis not present

## 2021-01-08 DIAGNOSIS — E785 Hyperlipidemia, unspecified: Secondary | ICD-10-CM | POA: Diagnosis not present

## 2021-01-11 DIAGNOSIS — R531 Weakness: Secondary | ICD-10-CM | POA: Diagnosis not present

## 2021-01-11 DIAGNOSIS — I4891 Unspecified atrial fibrillation: Secondary | ICD-10-CM | POA: Diagnosis not present

## 2021-01-11 DIAGNOSIS — Z9181 History of falling: Secondary | ICD-10-CM | POA: Diagnosis not present

## 2021-01-11 DIAGNOSIS — E785 Hyperlipidemia, unspecified: Secondary | ICD-10-CM | POA: Diagnosis not present

## 2021-01-11 DIAGNOSIS — Z950 Presence of cardiac pacemaker: Secondary | ICD-10-CM | POA: Diagnosis not present

## 2021-01-11 DIAGNOSIS — G20 Parkinson's disease: Secondary | ICD-10-CM | POA: Diagnosis not present

## 2021-01-13 DIAGNOSIS — I4891 Unspecified atrial fibrillation: Secondary | ICD-10-CM | POA: Diagnosis not present

## 2021-01-13 DIAGNOSIS — R531 Weakness: Secondary | ICD-10-CM | POA: Diagnosis not present

## 2021-01-13 DIAGNOSIS — G20 Parkinson's disease: Secondary | ICD-10-CM | POA: Diagnosis not present

## 2021-01-13 DIAGNOSIS — E785 Hyperlipidemia, unspecified: Secondary | ICD-10-CM | POA: Diagnosis not present

## 2021-01-13 DIAGNOSIS — Z9181 History of falling: Secondary | ICD-10-CM | POA: Diagnosis not present

## 2021-01-13 DIAGNOSIS — Z950 Presence of cardiac pacemaker: Secondary | ICD-10-CM | POA: Diagnosis not present

## 2021-01-14 ENCOUNTER — Ambulatory Visit (INDEPENDENT_AMBULATORY_CARE_PROVIDER_SITE_OTHER): Payer: Medicare Other

## 2021-01-14 DIAGNOSIS — I495 Sick sinus syndrome: Secondary | ICD-10-CM

## 2021-01-14 LAB — CUP PACEART REMOTE DEVICE CHECK
Battery Impedance: 1171 Ohm
Battery Remaining Longevity: 65 mo
Battery Voltage: 2.78 V
Brady Statistic AP VP Percent: 0 %
Brady Statistic AP VS Percent: 73 %
Brady Statistic AS VP Percent: 0 %
Brady Statistic AS VS Percent: 27 %
Date Time Interrogation Session: 20221222130502
Implantable Lead Implant Date: 20130611
Implantable Lead Implant Date: 20130611
Implantable Lead Location: 753859
Implantable Lead Location: 753860
Implantable Lead Model: 5076
Implantable Lead Model: 5076
Implantable Pulse Generator Implant Date: 20130611
Lead Channel Impedance Value: 430 Ohm
Lead Channel Impedance Value: 529 Ohm
Lead Channel Pacing Threshold Amplitude: 0.75 V
Lead Channel Pacing Threshold Amplitude: 0.875 V
Lead Channel Pacing Threshold Pulse Width: 0.4 ms
Lead Channel Pacing Threshold Pulse Width: 0.4 ms
Lead Channel Setting Pacing Amplitude: 1.5 V
Lead Channel Setting Pacing Amplitude: 2 V
Lead Channel Setting Pacing Pulse Width: 0.4 ms
Lead Channel Setting Sensing Sensitivity: 4 mV

## 2021-01-19 DIAGNOSIS — E785 Hyperlipidemia, unspecified: Secondary | ICD-10-CM | POA: Diagnosis not present

## 2021-01-19 DIAGNOSIS — G20 Parkinson's disease: Secondary | ICD-10-CM | POA: Diagnosis not present

## 2021-01-19 DIAGNOSIS — R531 Weakness: Secondary | ICD-10-CM | POA: Diagnosis not present

## 2021-01-19 DIAGNOSIS — Z9181 History of falling: Secondary | ICD-10-CM | POA: Diagnosis not present

## 2021-01-19 DIAGNOSIS — I4891 Unspecified atrial fibrillation: Secondary | ICD-10-CM | POA: Diagnosis not present

## 2021-01-19 DIAGNOSIS — Z950 Presence of cardiac pacemaker: Secondary | ICD-10-CM | POA: Diagnosis not present

## 2021-01-20 DIAGNOSIS — E785 Hyperlipidemia, unspecified: Secondary | ICD-10-CM | POA: Diagnosis not present

## 2021-01-20 DIAGNOSIS — I4891 Unspecified atrial fibrillation: Secondary | ICD-10-CM | POA: Diagnosis not present

## 2021-01-20 DIAGNOSIS — Z9181 History of falling: Secondary | ICD-10-CM | POA: Diagnosis not present

## 2021-01-20 DIAGNOSIS — Z950 Presence of cardiac pacemaker: Secondary | ICD-10-CM | POA: Diagnosis not present

## 2021-01-20 DIAGNOSIS — R531 Weakness: Secondary | ICD-10-CM | POA: Diagnosis not present

## 2021-01-20 DIAGNOSIS — G20 Parkinson's disease: Secondary | ICD-10-CM | POA: Diagnosis not present

## 2021-01-22 DIAGNOSIS — I4891 Unspecified atrial fibrillation: Secondary | ICD-10-CM | POA: Diagnosis not present

## 2021-01-22 DIAGNOSIS — Z9181 History of falling: Secondary | ICD-10-CM | POA: Diagnosis not present

## 2021-01-22 DIAGNOSIS — Z950 Presence of cardiac pacemaker: Secondary | ICD-10-CM | POA: Diagnosis not present

## 2021-01-22 DIAGNOSIS — E785 Hyperlipidemia, unspecified: Secondary | ICD-10-CM | POA: Diagnosis not present

## 2021-01-22 DIAGNOSIS — G20 Parkinson's disease: Secondary | ICD-10-CM | POA: Diagnosis not present

## 2021-01-22 DIAGNOSIS — R531 Weakness: Secondary | ICD-10-CM | POA: Diagnosis not present

## 2021-01-22 NOTE — Progress Notes (Signed)
Remote pacemaker transmission.   

## 2021-01-24 DIAGNOSIS — N3946 Mixed incontinence: Secondary | ICD-10-CM | POA: Diagnosis not present

## 2021-01-24 DIAGNOSIS — E785 Hyperlipidemia, unspecified: Secondary | ICD-10-CM | POA: Diagnosis not present

## 2021-01-24 DIAGNOSIS — G20 Parkinson's disease: Secondary | ICD-10-CM | POA: Diagnosis not present

## 2021-01-24 DIAGNOSIS — I4891 Unspecified atrial fibrillation: Secondary | ICD-10-CM | POA: Diagnosis not present

## 2021-01-24 DIAGNOSIS — L89151 Pressure ulcer of sacral region, stage 1: Secondary | ICD-10-CM | POA: Diagnosis not present

## 2021-01-24 DIAGNOSIS — R531 Weakness: Secondary | ICD-10-CM | POA: Diagnosis not present

## 2021-01-24 DIAGNOSIS — E039 Hypothyroidism, unspecified: Secondary | ICD-10-CM | POA: Diagnosis not present

## 2021-01-24 DIAGNOSIS — Z9181 History of falling: Secondary | ICD-10-CM | POA: Diagnosis not present

## 2021-01-24 DIAGNOSIS — I10 Essential (primary) hypertension: Secondary | ICD-10-CM | POA: Diagnosis not present

## 2021-01-24 DIAGNOSIS — Z950 Presence of cardiac pacemaker: Secondary | ICD-10-CM | POA: Diagnosis not present

## 2021-01-26 DIAGNOSIS — Z950 Presence of cardiac pacemaker: Secondary | ICD-10-CM | POA: Diagnosis not present

## 2021-01-26 DIAGNOSIS — I4891 Unspecified atrial fibrillation: Secondary | ICD-10-CM | POA: Diagnosis not present

## 2021-01-26 DIAGNOSIS — E785 Hyperlipidemia, unspecified: Secondary | ICD-10-CM | POA: Diagnosis not present

## 2021-01-26 DIAGNOSIS — R531 Weakness: Secondary | ICD-10-CM | POA: Diagnosis not present

## 2021-01-26 DIAGNOSIS — G20 Parkinson's disease: Secondary | ICD-10-CM | POA: Diagnosis not present

## 2021-01-26 DIAGNOSIS — Z9181 History of falling: Secondary | ICD-10-CM | POA: Diagnosis not present

## 2021-01-27 DIAGNOSIS — R531 Weakness: Secondary | ICD-10-CM | POA: Diagnosis not present

## 2021-01-27 DIAGNOSIS — Z950 Presence of cardiac pacemaker: Secondary | ICD-10-CM | POA: Diagnosis not present

## 2021-01-27 DIAGNOSIS — I4891 Unspecified atrial fibrillation: Secondary | ICD-10-CM | POA: Diagnosis not present

## 2021-01-27 DIAGNOSIS — G20 Parkinson's disease: Secondary | ICD-10-CM | POA: Diagnosis not present

## 2021-01-27 DIAGNOSIS — E785 Hyperlipidemia, unspecified: Secondary | ICD-10-CM | POA: Diagnosis not present

## 2021-01-27 DIAGNOSIS — Z9181 History of falling: Secondary | ICD-10-CM | POA: Diagnosis not present

## 2021-01-29 DIAGNOSIS — R531 Weakness: Secondary | ICD-10-CM | POA: Diagnosis not present

## 2021-01-29 DIAGNOSIS — I4891 Unspecified atrial fibrillation: Secondary | ICD-10-CM | POA: Diagnosis not present

## 2021-01-29 DIAGNOSIS — Z9181 History of falling: Secondary | ICD-10-CM | POA: Diagnosis not present

## 2021-01-29 DIAGNOSIS — G20 Parkinson's disease: Secondary | ICD-10-CM | POA: Diagnosis not present

## 2021-01-29 DIAGNOSIS — E785 Hyperlipidemia, unspecified: Secondary | ICD-10-CM | POA: Diagnosis not present

## 2021-01-29 DIAGNOSIS — Z950 Presence of cardiac pacemaker: Secondary | ICD-10-CM | POA: Diagnosis not present

## 2021-02-01 DIAGNOSIS — E785 Hyperlipidemia, unspecified: Secondary | ICD-10-CM | POA: Diagnosis not present

## 2021-02-01 DIAGNOSIS — Z950 Presence of cardiac pacemaker: Secondary | ICD-10-CM | POA: Diagnosis not present

## 2021-02-01 DIAGNOSIS — Z9181 History of falling: Secondary | ICD-10-CM | POA: Diagnosis not present

## 2021-02-01 DIAGNOSIS — R531 Weakness: Secondary | ICD-10-CM | POA: Diagnosis not present

## 2021-02-01 DIAGNOSIS — I4891 Unspecified atrial fibrillation: Secondary | ICD-10-CM | POA: Diagnosis not present

## 2021-02-01 DIAGNOSIS — G20 Parkinson's disease: Secondary | ICD-10-CM | POA: Diagnosis not present

## 2021-02-03 DIAGNOSIS — G20 Parkinson's disease: Secondary | ICD-10-CM | POA: Diagnosis not present

## 2021-02-03 DIAGNOSIS — R531 Weakness: Secondary | ICD-10-CM | POA: Diagnosis not present

## 2021-02-03 DIAGNOSIS — E785 Hyperlipidemia, unspecified: Secondary | ICD-10-CM | POA: Diagnosis not present

## 2021-02-03 DIAGNOSIS — Z9181 History of falling: Secondary | ICD-10-CM | POA: Diagnosis not present

## 2021-02-03 DIAGNOSIS — Z950 Presence of cardiac pacemaker: Secondary | ICD-10-CM | POA: Diagnosis not present

## 2021-02-03 DIAGNOSIS — I4891 Unspecified atrial fibrillation: Secondary | ICD-10-CM | POA: Diagnosis not present

## 2021-02-05 DIAGNOSIS — R531 Weakness: Secondary | ICD-10-CM | POA: Diagnosis not present

## 2021-02-05 DIAGNOSIS — Z950 Presence of cardiac pacemaker: Secondary | ICD-10-CM | POA: Diagnosis not present

## 2021-02-05 DIAGNOSIS — Z9181 History of falling: Secondary | ICD-10-CM | POA: Diagnosis not present

## 2021-02-05 DIAGNOSIS — E785 Hyperlipidemia, unspecified: Secondary | ICD-10-CM | POA: Diagnosis not present

## 2021-02-05 DIAGNOSIS — G20 Parkinson's disease: Secondary | ICD-10-CM | POA: Diagnosis not present

## 2021-02-05 DIAGNOSIS — I4891 Unspecified atrial fibrillation: Secondary | ICD-10-CM | POA: Diagnosis not present

## 2021-02-08 DIAGNOSIS — E785 Hyperlipidemia, unspecified: Secondary | ICD-10-CM | POA: Diagnosis not present

## 2021-02-08 DIAGNOSIS — R531 Weakness: Secondary | ICD-10-CM | POA: Diagnosis not present

## 2021-02-08 DIAGNOSIS — I4891 Unspecified atrial fibrillation: Secondary | ICD-10-CM | POA: Diagnosis not present

## 2021-02-08 DIAGNOSIS — G20 Parkinson's disease: Secondary | ICD-10-CM | POA: Diagnosis not present

## 2021-02-08 DIAGNOSIS — Z950 Presence of cardiac pacemaker: Secondary | ICD-10-CM | POA: Diagnosis not present

## 2021-02-08 DIAGNOSIS — Z9181 History of falling: Secondary | ICD-10-CM | POA: Diagnosis not present

## 2021-02-10 DIAGNOSIS — I4891 Unspecified atrial fibrillation: Secondary | ICD-10-CM | POA: Diagnosis not present

## 2021-02-10 DIAGNOSIS — G20 Parkinson's disease: Secondary | ICD-10-CM | POA: Diagnosis not present

## 2021-02-10 DIAGNOSIS — R531 Weakness: Secondary | ICD-10-CM | POA: Diagnosis not present

## 2021-02-10 DIAGNOSIS — Z950 Presence of cardiac pacemaker: Secondary | ICD-10-CM | POA: Diagnosis not present

## 2021-02-10 DIAGNOSIS — Z9181 History of falling: Secondary | ICD-10-CM | POA: Diagnosis not present

## 2021-02-10 DIAGNOSIS — E785 Hyperlipidemia, unspecified: Secondary | ICD-10-CM | POA: Diagnosis not present

## 2021-02-12 DIAGNOSIS — G20 Parkinson's disease: Secondary | ICD-10-CM | POA: Diagnosis not present

## 2021-02-12 DIAGNOSIS — E785 Hyperlipidemia, unspecified: Secondary | ICD-10-CM | POA: Diagnosis not present

## 2021-02-12 DIAGNOSIS — R531 Weakness: Secondary | ICD-10-CM | POA: Diagnosis not present

## 2021-02-12 DIAGNOSIS — I4891 Unspecified atrial fibrillation: Secondary | ICD-10-CM | POA: Diagnosis not present

## 2021-02-12 DIAGNOSIS — Z9181 History of falling: Secondary | ICD-10-CM | POA: Diagnosis not present

## 2021-02-12 DIAGNOSIS — Z950 Presence of cardiac pacemaker: Secondary | ICD-10-CM | POA: Diagnosis not present

## 2021-02-15 DIAGNOSIS — E785 Hyperlipidemia, unspecified: Secondary | ICD-10-CM | POA: Diagnosis not present

## 2021-02-15 DIAGNOSIS — Z9181 History of falling: Secondary | ICD-10-CM | POA: Diagnosis not present

## 2021-02-15 DIAGNOSIS — G20 Parkinson's disease: Secondary | ICD-10-CM | POA: Diagnosis not present

## 2021-02-15 DIAGNOSIS — Z950 Presence of cardiac pacemaker: Secondary | ICD-10-CM | POA: Diagnosis not present

## 2021-02-15 DIAGNOSIS — R531 Weakness: Secondary | ICD-10-CM | POA: Diagnosis not present

## 2021-02-15 DIAGNOSIS — I4891 Unspecified atrial fibrillation: Secondary | ICD-10-CM | POA: Diagnosis not present

## 2021-02-17 DIAGNOSIS — G20 Parkinson's disease: Secondary | ICD-10-CM | POA: Diagnosis not present

## 2021-02-17 DIAGNOSIS — Z9181 History of falling: Secondary | ICD-10-CM | POA: Diagnosis not present

## 2021-02-17 DIAGNOSIS — R531 Weakness: Secondary | ICD-10-CM | POA: Diagnosis not present

## 2021-02-17 DIAGNOSIS — E785 Hyperlipidemia, unspecified: Secondary | ICD-10-CM | POA: Diagnosis not present

## 2021-02-17 DIAGNOSIS — I4891 Unspecified atrial fibrillation: Secondary | ICD-10-CM | POA: Diagnosis not present

## 2021-02-17 DIAGNOSIS — Z950 Presence of cardiac pacemaker: Secondary | ICD-10-CM | POA: Diagnosis not present

## 2021-02-19 DIAGNOSIS — I4891 Unspecified atrial fibrillation: Secondary | ICD-10-CM | POA: Diagnosis not present

## 2021-02-19 DIAGNOSIS — Z9181 History of falling: Secondary | ICD-10-CM | POA: Diagnosis not present

## 2021-02-19 DIAGNOSIS — E785 Hyperlipidemia, unspecified: Secondary | ICD-10-CM | POA: Diagnosis not present

## 2021-02-19 DIAGNOSIS — G20 Parkinson's disease: Secondary | ICD-10-CM | POA: Diagnosis not present

## 2021-02-19 DIAGNOSIS — Z950 Presence of cardiac pacemaker: Secondary | ICD-10-CM | POA: Diagnosis not present

## 2021-02-19 DIAGNOSIS — R531 Weakness: Secondary | ICD-10-CM | POA: Diagnosis not present

## 2021-02-22 DIAGNOSIS — E785 Hyperlipidemia, unspecified: Secondary | ICD-10-CM | POA: Diagnosis not present

## 2021-02-22 DIAGNOSIS — G20 Parkinson's disease: Secondary | ICD-10-CM | POA: Diagnosis not present

## 2021-02-22 DIAGNOSIS — Z950 Presence of cardiac pacemaker: Secondary | ICD-10-CM | POA: Diagnosis not present

## 2021-02-22 DIAGNOSIS — Z9181 History of falling: Secondary | ICD-10-CM | POA: Diagnosis not present

## 2021-02-22 DIAGNOSIS — R531 Weakness: Secondary | ICD-10-CM | POA: Diagnosis not present

## 2021-02-22 DIAGNOSIS — I4891 Unspecified atrial fibrillation: Secondary | ICD-10-CM | POA: Diagnosis not present

## 2021-02-24 DIAGNOSIS — E785 Hyperlipidemia, unspecified: Secondary | ICD-10-CM | POA: Diagnosis not present

## 2021-02-24 DIAGNOSIS — L89151 Pressure ulcer of sacral region, stage 1: Secondary | ICD-10-CM | POA: Diagnosis not present

## 2021-02-24 DIAGNOSIS — R531 Weakness: Secondary | ICD-10-CM | POA: Diagnosis not present

## 2021-02-24 DIAGNOSIS — I4891 Unspecified atrial fibrillation: Secondary | ICD-10-CM | POA: Diagnosis not present

## 2021-02-24 DIAGNOSIS — N3946 Mixed incontinence: Secondary | ICD-10-CM | POA: Diagnosis not present

## 2021-02-24 DIAGNOSIS — Z9181 History of falling: Secondary | ICD-10-CM | POA: Diagnosis not present

## 2021-02-24 DIAGNOSIS — Z950 Presence of cardiac pacemaker: Secondary | ICD-10-CM | POA: Diagnosis not present

## 2021-02-24 DIAGNOSIS — I10 Essential (primary) hypertension: Secondary | ICD-10-CM | POA: Diagnosis not present

## 2021-02-24 DIAGNOSIS — G20 Parkinson's disease: Secondary | ICD-10-CM | POA: Diagnosis not present

## 2021-02-24 DIAGNOSIS — E039 Hypothyroidism, unspecified: Secondary | ICD-10-CM | POA: Diagnosis not present

## 2021-02-26 DIAGNOSIS — G20 Parkinson's disease: Secondary | ICD-10-CM | POA: Diagnosis not present

## 2021-02-26 DIAGNOSIS — R531 Weakness: Secondary | ICD-10-CM | POA: Diagnosis not present

## 2021-02-26 DIAGNOSIS — I4891 Unspecified atrial fibrillation: Secondary | ICD-10-CM | POA: Diagnosis not present

## 2021-02-26 DIAGNOSIS — Z9181 History of falling: Secondary | ICD-10-CM | POA: Diagnosis not present

## 2021-02-26 DIAGNOSIS — Z950 Presence of cardiac pacemaker: Secondary | ICD-10-CM | POA: Diagnosis not present

## 2021-02-26 DIAGNOSIS — E785 Hyperlipidemia, unspecified: Secondary | ICD-10-CM | POA: Diagnosis not present

## 2021-03-01 DIAGNOSIS — G20 Parkinson's disease: Secondary | ICD-10-CM | POA: Diagnosis not present

## 2021-03-01 DIAGNOSIS — Z950 Presence of cardiac pacemaker: Secondary | ICD-10-CM | POA: Diagnosis not present

## 2021-03-01 DIAGNOSIS — I4891 Unspecified atrial fibrillation: Secondary | ICD-10-CM | POA: Diagnosis not present

## 2021-03-01 DIAGNOSIS — Z9181 History of falling: Secondary | ICD-10-CM | POA: Diagnosis not present

## 2021-03-01 DIAGNOSIS — E785 Hyperlipidemia, unspecified: Secondary | ICD-10-CM | POA: Diagnosis not present

## 2021-03-01 DIAGNOSIS — R531 Weakness: Secondary | ICD-10-CM | POA: Diagnosis not present

## 2021-03-03 DIAGNOSIS — G20 Parkinson's disease: Secondary | ICD-10-CM | POA: Diagnosis not present

## 2021-03-03 DIAGNOSIS — Z950 Presence of cardiac pacemaker: Secondary | ICD-10-CM | POA: Diagnosis not present

## 2021-03-03 DIAGNOSIS — E785 Hyperlipidemia, unspecified: Secondary | ICD-10-CM | POA: Diagnosis not present

## 2021-03-03 DIAGNOSIS — I4891 Unspecified atrial fibrillation: Secondary | ICD-10-CM | POA: Diagnosis not present

## 2021-03-03 DIAGNOSIS — Z9181 History of falling: Secondary | ICD-10-CM | POA: Diagnosis not present

## 2021-03-03 DIAGNOSIS — R531 Weakness: Secondary | ICD-10-CM | POA: Diagnosis not present

## 2021-03-05 DIAGNOSIS — Z9181 History of falling: Secondary | ICD-10-CM | POA: Diagnosis not present

## 2021-03-05 DIAGNOSIS — Z950 Presence of cardiac pacemaker: Secondary | ICD-10-CM | POA: Diagnosis not present

## 2021-03-05 DIAGNOSIS — I4891 Unspecified atrial fibrillation: Secondary | ICD-10-CM | POA: Diagnosis not present

## 2021-03-05 DIAGNOSIS — E785 Hyperlipidemia, unspecified: Secondary | ICD-10-CM | POA: Diagnosis not present

## 2021-03-05 DIAGNOSIS — R531 Weakness: Secondary | ICD-10-CM | POA: Diagnosis not present

## 2021-03-05 DIAGNOSIS — G20 Parkinson's disease: Secondary | ICD-10-CM | POA: Diagnosis not present

## 2021-03-08 DIAGNOSIS — Z9181 History of falling: Secondary | ICD-10-CM | POA: Diagnosis not present

## 2021-03-08 DIAGNOSIS — E785 Hyperlipidemia, unspecified: Secondary | ICD-10-CM | POA: Diagnosis not present

## 2021-03-08 DIAGNOSIS — G20 Parkinson's disease: Secondary | ICD-10-CM | POA: Diagnosis not present

## 2021-03-08 DIAGNOSIS — R531 Weakness: Secondary | ICD-10-CM | POA: Diagnosis not present

## 2021-03-08 DIAGNOSIS — I4891 Unspecified atrial fibrillation: Secondary | ICD-10-CM | POA: Diagnosis not present

## 2021-03-08 DIAGNOSIS — Z950 Presence of cardiac pacemaker: Secondary | ICD-10-CM | POA: Diagnosis not present

## 2021-03-10 DIAGNOSIS — R531 Weakness: Secondary | ICD-10-CM | POA: Diagnosis not present

## 2021-03-10 DIAGNOSIS — Z9181 History of falling: Secondary | ICD-10-CM | POA: Diagnosis not present

## 2021-03-10 DIAGNOSIS — E785 Hyperlipidemia, unspecified: Secondary | ICD-10-CM | POA: Diagnosis not present

## 2021-03-10 DIAGNOSIS — Z950 Presence of cardiac pacemaker: Secondary | ICD-10-CM | POA: Diagnosis not present

## 2021-03-10 DIAGNOSIS — I4891 Unspecified atrial fibrillation: Secondary | ICD-10-CM | POA: Diagnosis not present

## 2021-03-10 DIAGNOSIS — G20 Parkinson's disease: Secondary | ICD-10-CM | POA: Diagnosis not present

## 2021-03-12 DIAGNOSIS — G20 Parkinson's disease: Secondary | ICD-10-CM | POA: Diagnosis not present

## 2021-03-12 DIAGNOSIS — I4891 Unspecified atrial fibrillation: Secondary | ICD-10-CM | POA: Diagnosis not present

## 2021-03-12 DIAGNOSIS — R531 Weakness: Secondary | ICD-10-CM | POA: Diagnosis not present

## 2021-03-12 DIAGNOSIS — Z950 Presence of cardiac pacemaker: Secondary | ICD-10-CM | POA: Diagnosis not present

## 2021-03-12 DIAGNOSIS — Z9181 History of falling: Secondary | ICD-10-CM | POA: Diagnosis not present

## 2021-03-12 DIAGNOSIS — E785 Hyperlipidemia, unspecified: Secondary | ICD-10-CM | POA: Diagnosis not present

## 2021-03-15 DIAGNOSIS — G20 Parkinson's disease: Secondary | ICD-10-CM | POA: Diagnosis not present

## 2021-03-15 DIAGNOSIS — R531 Weakness: Secondary | ICD-10-CM | POA: Diagnosis not present

## 2021-03-15 DIAGNOSIS — Z950 Presence of cardiac pacemaker: Secondary | ICD-10-CM | POA: Diagnosis not present

## 2021-03-15 DIAGNOSIS — Z9181 History of falling: Secondary | ICD-10-CM | POA: Diagnosis not present

## 2021-03-15 DIAGNOSIS — I4891 Unspecified atrial fibrillation: Secondary | ICD-10-CM | POA: Diagnosis not present

## 2021-03-15 DIAGNOSIS — E785 Hyperlipidemia, unspecified: Secondary | ICD-10-CM | POA: Diagnosis not present

## 2021-03-16 DIAGNOSIS — I4891 Unspecified atrial fibrillation: Secondary | ICD-10-CM | POA: Diagnosis not present

## 2021-03-16 DIAGNOSIS — E785 Hyperlipidemia, unspecified: Secondary | ICD-10-CM | POA: Diagnosis not present

## 2021-03-16 DIAGNOSIS — R531 Weakness: Secondary | ICD-10-CM | POA: Diagnosis not present

## 2021-03-16 DIAGNOSIS — G20 Parkinson's disease: Secondary | ICD-10-CM | POA: Diagnosis not present

## 2021-03-16 DIAGNOSIS — Z950 Presence of cardiac pacemaker: Secondary | ICD-10-CM | POA: Diagnosis not present

## 2021-03-16 DIAGNOSIS — Z9181 History of falling: Secondary | ICD-10-CM | POA: Diagnosis not present

## 2021-03-17 DIAGNOSIS — I4891 Unspecified atrial fibrillation: Secondary | ICD-10-CM | POA: Diagnosis not present

## 2021-03-17 DIAGNOSIS — G20 Parkinson's disease: Secondary | ICD-10-CM | POA: Diagnosis not present

## 2021-03-17 DIAGNOSIS — R531 Weakness: Secondary | ICD-10-CM | POA: Diagnosis not present

## 2021-03-17 DIAGNOSIS — Z9181 History of falling: Secondary | ICD-10-CM | POA: Diagnosis not present

## 2021-03-17 DIAGNOSIS — Z950 Presence of cardiac pacemaker: Secondary | ICD-10-CM | POA: Diagnosis not present

## 2021-03-17 DIAGNOSIS — E785 Hyperlipidemia, unspecified: Secondary | ICD-10-CM | POA: Diagnosis not present

## 2021-03-19 DIAGNOSIS — Z950 Presence of cardiac pacemaker: Secondary | ICD-10-CM | POA: Diagnosis not present

## 2021-03-19 DIAGNOSIS — G20 Parkinson's disease: Secondary | ICD-10-CM | POA: Diagnosis not present

## 2021-03-19 DIAGNOSIS — I4891 Unspecified atrial fibrillation: Secondary | ICD-10-CM | POA: Diagnosis not present

## 2021-03-19 DIAGNOSIS — Z9181 History of falling: Secondary | ICD-10-CM | POA: Diagnosis not present

## 2021-03-19 DIAGNOSIS — E785 Hyperlipidemia, unspecified: Secondary | ICD-10-CM | POA: Diagnosis not present

## 2021-03-19 DIAGNOSIS — R531 Weakness: Secondary | ICD-10-CM | POA: Diagnosis not present

## 2021-03-22 ENCOUNTER — Telehealth: Payer: Self-pay | Admitting: Cardiovascular Disease

## 2021-03-22 DIAGNOSIS — Z950 Presence of cardiac pacemaker: Secondary | ICD-10-CM | POA: Diagnosis not present

## 2021-03-22 DIAGNOSIS — E785 Hyperlipidemia, unspecified: Secondary | ICD-10-CM | POA: Diagnosis not present

## 2021-03-22 DIAGNOSIS — G20 Parkinson's disease: Secondary | ICD-10-CM | POA: Diagnosis not present

## 2021-03-22 DIAGNOSIS — Z9181 History of falling: Secondary | ICD-10-CM | POA: Diagnosis not present

## 2021-03-22 DIAGNOSIS — R531 Weakness: Secondary | ICD-10-CM | POA: Diagnosis not present

## 2021-03-22 DIAGNOSIS — I4891 Unspecified atrial fibrillation: Secondary | ICD-10-CM | POA: Diagnosis not present

## 2021-03-22 NOTE — Telephone Encounter (Signed)
Patient is currently in hospice care and will not need to follow up in the office.

## 2021-03-24 DIAGNOSIS — R531 Weakness: Secondary | ICD-10-CM | POA: Diagnosis not present

## 2021-03-24 DIAGNOSIS — I10 Essential (primary) hypertension: Secondary | ICD-10-CM | POA: Diagnosis not present

## 2021-03-24 DIAGNOSIS — L89151 Pressure ulcer of sacral region, stage 1: Secondary | ICD-10-CM | POA: Diagnosis not present

## 2021-03-24 DIAGNOSIS — N3946 Mixed incontinence: Secondary | ICD-10-CM | POA: Diagnosis not present

## 2021-03-24 DIAGNOSIS — Z950 Presence of cardiac pacemaker: Secondary | ICD-10-CM | POA: Diagnosis not present

## 2021-03-24 DIAGNOSIS — E785 Hyperlipidemia, unspecified: Secondary | ICD-10-CM | POA: Diagnosis not present

## 2021-03-24 DIAGNOSIS — E039 Hypothyroidism, unspecified: Secondary | ICD-10-CM | POA: Diagnosis not present

## 2021-03-24 DIAGNOSIS — I4891 Unspecified atrial fibrillation: Secondary | ICD-10-CM | POA: Diagnosis not present

## 2021-03-24 DIAGNOSIS — Z9181 History of falling: Secondary | ICD-10-CM | POA: Diagnosis not present

## 2021-03-24 DIAGNOSIS — G20 Parkinson's disease: Secondary | ICD-10-CM | POA: Diagnosis not present

## 2021-03-25 DIAGNOSIS — I4891 Unspecified atrial fibrillation: Secondary | ICD-10-CM | POA: Diagnosis not present

## 2021-03-25 DIAGNOSIS — E785 Hyperlipidemia, unspecified: Secondary | ICD-10-CM | POA: Diagnosis not present

## 2021-03-25 DIAGNOSIS — R531 Weakness: Secondary | ICD-10-CM | POA: Diagnosis not present

## 2021-03-25 DIAGNOSIS — G20 Parkinson's disease: Secondary | ICD-10-CM | POA: Diagnosis not present

## 2021-03-25 DIAGNOSIS — Z950 Presence of cardiac pacemaker: Secondary | ICD-10-CM | POA: Diagnosis not present

## 2021-03-25 DIAGNOSIS — Z9181 History of falling: Secondary | ICD-10-CM | POA: Diagnosis not present

## 2021-03-26 DIAGNOSIS — Z9181 History of falling: Secondary | ICD-10-CM | POA: Diagnosis not present

## 2021-03-26 DIAGNOSIS — E785 Hyperlipidemia, unspecified: Secondary | ICD-10-CM | POA: Diagnosis not present

## 2021-03-26 DIAGNOSIS — I4891 Unspecified atrial fibrillation: Secondary | ICD-10-CM | POA: Diagnosis not present

## 2021-03-26 DIAGNOSIS — R531 Weakness: Secondary | ICD-10-CM | POA: Diagnosis not present

## 2021-03-26 DIAGNOSIS — Z950 Presence of cardiac pacemaker: Secondary | ICD-10-CM | POA: Diagnosis not present

## 2021-03-26 DIAGNOSIS — G20 Parkinson's disease: Secondary | ICD-10-CM | POA: Diagnosis not present

## 2021-03-29 DIAGNOSIS — G20 Parkinson's disease: Secondary | ICD-10-CM | POA: Diagnosis not present

## 2021-03-29 DIAGNOSIS — I4891 Unspecified atrial fibrillation: Secondary | ICD-10-CM | POA: Diagnosis not present

## 2021-03-29 DIAGNOSIS — Z950 Presence of cardiac pacemaker: Secondary | ICD-10-CM | POA: Diagnosis not present

## 2021-03-29 DIAGNOSIS — R531 Weakness: Secondary | ICD-10-CM | POA: Diagnosis not present

## 2021-03-29 DIAGNOSIS — Z9181 History of falling: Secondary | ICD-10-CM | POA: Diagnosis not present

## 2021-03-29 DIAGNOSIS — E785 Hyperlipidemia, unspecified: Secondary | ICD-10-CM | POA: Diagnosis not present

## 2021-03-30 DIAGNOSIS — Z950 Presence of cardiac pacemaker: Secondary | ICD-10-CM | POA: Diagnosis not present

## 2021-03-30 DIAGNOSIS — Z9181 History of falling: Secondary | ICD-10-CM | POA: Diagnosis not present

## 2021-03-30 DIAGNOSIS — E785 Hyperlipidemia, unspecified: Secondary | ICD-10-CM | POA: Diagnosis not present

## 2021-03-30 DIAGNOSIS — R531 Weakness: Secondary | ICD-10-CM | POA: Diagnosis not present

## 2021-03-30 DIAGNOSIS — G20 Parkinson's disease: Secondary | ICD-10-CM | POA: Diagnosis not present

## 2021-03-30 DIAGNOSIS — I4891 Unspecified atrial fibrillation: Secondary | ICD-10-CM | POA: Diagnosis not present

## 2021-03-31 DIAGNOSIS — E785 Hyperlipidemia, unspecified: Secondary | ICD-10-CM | POA: Diagnosis not present

## 2021-03-31 DIAGNOSIS — I4891 Unspecified atrial fibrillation: Secondary | ICD-10-CM | POA: Diagnosis not present

## 2021-03-31 DIAGNOSIS — G20 Parkinson's disease: Secondary | ICD-10-CM | POA: Diagnosis not present

## 2021-03-31 DIAGNOSIS — Z9181 History of falling: Secondary | ICD-10-CM | POA: Diagnosis not present

## 2021-03-31 DIAGNOSIS — R531 Weakness: Secondary | ICD-10-CM | POA: Diagnosis not present

## 2021-03-31 DIAGNOSIS — Z950 Presence of cardiac pacemaker: Secondary | ICD-10-CM | POA: Diagnosis not present

## 2021-04-02 DIAGNOSIS — G20 Parkinson's disease: Secondary | ICD-10-CM | POA: Diagnosis not present

## 2021-04-02 DIAGNOSIS — Z950 Presence of cardiac pacemaker: Secondary | ICD-10-CM | POA: Diagnosis not present

## 2021-04-02 DIAGNOSIS — Z9181 History of falling: Secondary | ICD-10-CM | POA: Diagnosis not present

## 2021-04-02 DIAGNOSIS — I4891 Unspecified atrial fibrillation: Secondary | ICD-10-CM | POA: Diagnosis not present

## 2021-04-02 DIAGNOSIS — E785 Hyperlipidemia, unspecified: Secondary | ICD-10-CM | POA: Diagnosis not present

## 2021-04-02 DIAGNOSIS — R531 Weakness: Secondary | ICD-10-CM | POA: Diagnosis not present

## 2021-04-05 DIAGNOSIS — E785 Hyperlipidemia, unspecified: Secondary | ICD-10-CM | POA: Diagnosis not present

## 2021-04-05 DIAGNOSIS — R531 Weakness: Secondary | ICD-10-CM | POA: Diagnosis not present

## 2021-04-05 DIAGNOSIS — Z950 Presence of cardiac pacemaker: Secondary | ICD-10-CM | POA: Diagnosis not present

## 2021-04-05 DIAGNOSIS — I4891 Unspecified atrial fibrillation: Secondary | ICD-10-CM | POA: Diagnosis not present

## 2021-04-05 DIAGNOSIS — G20 Parkinson's disease: Secondary | ICD-10-CM | POA: Diagnosis not present

## 2021-04-05 DIAGNOSIS — Z9181 History of falling: Secondary | ICD-10-CM | POA: Diagnosis not present

## 2021-04-07 DIAGNOSIS — Z950 Presence of cardiac pacemaker: Secondary | ICD-10-CM | POA: Diagnosis not present

## 2021-04-07 DIAGNOSIS — E785 Hyperlipidemia, unspecified: Secondary | ICD-10-CM | POA: Diagnosis not present

## 2021-04-07 DIAGNOSIS — G20 Parkinson's disease: Secondary | ICD-10-CM | POA: Diagnosis not present

## 2021-04-07 DIAGNOSIS — I4891 Unspecified atrial fibrillation: Secondary | ICD-10-CM | POA: Diagnosis not present

## 2021-04-07 DIAGNOSIS — R531 Weakness: Secondary | ICD-10-CM | POA: Diagnosis not present

## 2021-04-07 DIAGNOSIS — Z9181 History of falling: Secondary | ICD-10-CM | POA: Diagnosis not present

## 2021-04-09 DIAGNOSIS — G20 Parkinson's disease: Secondary | ICD-10-CM | POA: Diagnosis not present

## 2021-04-09 DIAGNOSIS — Z950 Presence of cardiac pacemaker: Secondary | ICD-10-CM | POA: Diagnosis not present

## 2021-04-09 DIAGNOSIS — Z9181 History of falling: Secondary | ICD-10-CM | POA: Diagnosis not present

## 2021-04-09 DIAGNOSIS — I4891 Unspecified atrial fibrillation: Secondary | ICD-10-CM | POA: Diagnosis not present

## 2021-04-09 DIAGNOSIS — R531 Weakness: Secondary | ICD-10-CM | POA: Diagnosis not present

## 2021-04-09 DIAGNOSIS — E785 Hyperlipidemia, unspecified: Secondary | ICD-10-CM | POA: Diagnosis not present

## 2021-04-12 DIAGNOSIS — Z950 Presence of cardiac pacemaker: Secondary | ICD-10-CM | POA: Diagnosis not present

## 2021-04-12 DIAGNOSIS — I4891 Unspecified atrial fibrillation: Secondary | ICD-10-CM | POA: Diagnosis not present

## 2021-04-12 DIAGNOSIS — G20 Parkinson's disease: Secondary | ICD-10-CM | POA: Diagnosis not present

## 2021-04-12 DIAGNOSIS — E785 Hyperlipidemia, unspecified: Secondary | ICD-10-CM | POA: Diagnosis not present

## 2021-04-12 DIAGNOSIS — Z9181 History of falling: Secondary | ICD-10-CM | POA: Diagnosis not present

## 2021-04-12 DIAGNOSIS — R531 Weakness: Secondary | ICD-10-CM | POA: Diagnosis not present

## 2021-04-13 DIAGNOSIS — R531 Weakness: Secondary | ICD-10-CM | POA: Diagnosis not present

## 2021-04-13 DIAGNOSIS — Z950 Presence of cardiac pacemaker: Secondary | ICD-10-CM | POA: Diagnosis not present

## 2021-04-13 DIAGNOSIS — Z9181 History of falling: Secondary | ICD-10-CM | POA: Diagnosis not present

## 2021-04-13 DIAGNOSIS — E785 Hyperlipidemia, unspecified: Secondary | ICD-10-CM | POA: Diagnosis not present

## 2021-04-13 DIAGNOSIS — G20 Parkinson's disease: Secondary | ICD-10-CM | POA: Diagnosis not present

## 2021-04-13 DIAGNOSIS — I4891 Unspecified atrial fibrillation: Secondary | ICD-10-CM | POA: Diagnosis not present

## 2021-04-14 DIAGNOSIS — Z950 Presence of cardiac pacemaker: Secondary | ICD-10-CM | POA: Diagnosis not present

## 2021-04-14 DIAGNOSIS — G20 Parkinson's disease: Secondary | ICD-10-CM | POA: Diagnosis not present

## 2021-04-14 DIAGNOSIS — E785 Hyperlipidemia, unspecified: Secondary | ICD-10-CM | POA: Diagnosis not present

## 2021-04-14 DIAGNOSIS — I4891 Unspecified atrial fibrillation: Secondary | ICD-10-CM | POA: Diagnosis not present

## 2021-04-14 DIAGNOSIS — Z9181 History of falling: Secondary | ICD-10-CM | POA: Diagnosis not present

## 2021-04-14 DIAGNOSIS — R531 Weakness: Secondary | ICD-10-CM | POA: Diagnosis not present

## 2021-04-15 ENCOUNTER — Ambulatory Visit (INDEPENDENT_AMBULATORY_CARE_PROVIDER_SITE_OTHER): Payer: Medicare Other

## 2021-04-15 DIAGNOSIS — I495 Sick sinus syndrome: Secondary | ICD-10-CM | POA: Diagnosis not present

## 2021-04-15 LAB — CUP PACEART REMOTE DEVICE CHECK
Battery Impedance: 1307 Ohm
Battery Remaining Longevity: 56 mo
Battery Voltage: 2.77 V
Brady Statistic AP VP Percent: 0 %
Brady Statistic AP VS Percent: 73 %
Brady Statistic AS VP Percent: 0 %
Brady Statistic AS VS Percent: 27 %
Date Time Interrogation Session: 20230323122558
Implantable Lead Implant Date: 20130611
Implantable Lead Implant Date: 20130611
Implantable Lead Location: 753859
Implantable Lead Location: 753860
Implantable Lead Model: 5076
Implantable Lead Model: 5076
Implantable Pulse Generator Implant Date: 20130611
Lead Channel Impedance Value: 455 Ohm
Lead Channel Impedance Value: 620 Ohm
Lead Channel Pacing Threshold Amplitude: 0.625 V
Lead Channel Pacing Threshold Amplitude: 0.75 V
Lead Channel Pacing Threshold Pulse Width: 0.4 ms
Lead Channel Pacing Threshold Pulse Width: 0.4 ms
Lead Channel Setting Pacing Amplitude: 1.5 V
Lead Channel Setting Pacing Amplitude: 3.5 V
Lead Channel Setting Pacing Pulse Width: 0.46 ms
Lead Channel Setting Sensing Sensitivity: 5.6 mV

## 2021-04-16 DIAGNOSIS — E785 Hyperlipidemia, unspecified: Secondary | ICD-10-CM | POA: Diagnosis not present

## 2021-04-16 DIAGNOSIS — Z950 Presence of cardiac pacemaker: Secondary | ICD-10-CM | POA: Diagnosis not present

## 2021-04-16 DIAGNOSIS — G20 Parkinson's disease: Secondary | ICD-10-CM | POA: Diagnosis not present

## 2021-04-16 DIAGNOSIS — R531 Weakness: Secondary | ICD-10-CM | POA: Diagnosis not present

## 2021-04-16 DIAGNOSIS — I4891 Unspecified atrial fibrillation: Secondary | ICD-10-CM | POA: Diagnosis not present

## 2021-04-16 DIAGNOSIS — Z9181 History of falling: Secondary | ICD-10-CM | POA: Diagnosis not present

## 2021-04-19 DIAGNOSIS — I4891 Unspecified atrial fibrillation: Secondary | ICD-10-CM | POA: Diagnosis not present

## 2021-04-19 DIAGNOSIS — Z950 Presence of cardiac pacemaker: Secondary | ICD-10-CM | POA: Diagnosis not present

## 2021-04-19 DIAGNOSIS — G20 Parkinson's disease: Secondary | ICD-10-CM | POA: Diagnosis not present

## 2021-04-19 DIAGNOSIS — Z9181 History of falling: Secondary | ICD-10-CM | POA: Diagnosis not present

## 2021-04-19 DIAGNOSIS — R531 Weakness: Secondary | ICD-10-CM | POA: Diagnosis not present

## 2021-04-19 DIAGNOSIS — E785 Hyperlipidemia, unspecified: Secondary | ICD-10-CM | POA: Diagnosis not present

## 2021-04-20 DIAGNOSIS — Z9181 History of falling: Secondary | ICD-10-CM | POA: Diagnosis not present

## 2021-04-20 DIAGNOSIS — Z950 Presence of cardiac pacemaker: Secondary | ICD-10-CM | POA: Diagnosis not present

## 2021-04-20 DIAGNOSIS — G20 Parkinson's disease: Secondary | ICD-10-CM | POA: Diagnosis not present

## 2021-04-20 DIAGNOSIS — R531 Weakness: Secondary | ICD-10-CM | POA: Diagnosis not present

## 2021-04-20 DIAGNOSIS — I4891 Unspecified atrial fibrillation: Secondary | ICD-10-CM | POA: Diagnosis not present

## 2021-04-20 DIAGNOSIS — E785 Hyperlipidemia, unspecified: Secondary | ICD-10-CM | POA: Diagnosis not present

## 2021-04-21 DIAGNOSIS — E785 Hyperlipidemia, unspecified: Secondary | ICD-10-CM | POA: Diagnosis not present

## 2021-04-21 DIAGNOSIS — Z9181 History of falling: Secondary | ICD-10-CM | POA: Diagnosis not present

## 2021-04-21 DIAGNOSIS — I4891 Unspecified atrial fibrillation: Secondary | ICD-10-CM | POA: Diagnosis not present

## 2021-04-21 DIAGNOSIS — G20 Parkinson's disease: Secondary | ICD-10-CM | POA: Diagnosis not present

## 2021-04-21 DIAGNOSIS — R531 Weakness: Secondary | ICD-10-CM | POA: Diagnosis not present

## 2021-04-21 DIAGNOSIS — Z950 Presence of cardiac pacemaker: Secondary | ICD-10-CM | POA: Diagnosis not present

## 2021-04-22 NOTE — Progress Notes (Signed)
Remote pacemaker transmission.   

## 2021-04-23 DIAGNOSIS — Z9181 History of falling: Secondary | ICD-10-CM | POA: Diagnosis not present

## 2021-04-23 DIAGNOSIS — Z950 Presence of cardiac pacemaker: Secondary | ICD-10-CM | POA: Diagnosis not present

## 2021-04-23 DIAGNOSIS — G20 Parkinson's disease: Secondary | ICD-10-CM | POA: Diagnosis not present

## 2021-04-23 DIAGNOSIS — R531 Weakness: Secondary | ICD-10-CM | POA: Diagnosis not present

## 2021-04-23 DIAGNOSIS — E785 Hyperlipidemia, unspecified: Secondary | ICD-10-CM | POA: Diagnosis not present

## 2021-04-23 DIAGNOSIS — I4891 Unspecified atrial fibrillation: Secondary | ICD-10-CM | POA: Diagnosis not present

## 2021-04-24 DIAGNOSIS — E785 Hyperlipidemia, unspecified: Secondary | ICD-10-CM | POA: Diagnosis not present

## 2021-04-24 DIAGNOSIS — E039 Hypothyroidism, unspecified: Secondary | ICD-10-CM | POA: Diagnosis not present

## 2021-04-24 DIAGNOSIS — R531 Weakness: Secondary | ICD-10-CM | POA: Diagnosis not present

## 2021-04-24 DIAGNOSIS — I10 Essential (primary) hypertension: Secondary | ICD-10-CM | POA: Diagnosis not present

## 2021-04-24 DIAGNOSIS — I4891 Unspecified atrial fibrillation: Secondary | ICD-10-CM | POA: Diagnosis not present

## 2021-04-24 DIAGNOSIS — N3946 Mixed incontinence: Secondary | ICD-10-CM | POA: Diagnosis not present

## 2021-04-24 DIAGNOSIS — Z950 Presence of cardiac pacemaker: Secondary | ICD-10-CM | POA: Diagnosis not present

## 2021-04-24 DIAGNOSIS — L89151 Pressure ulcer of sacral region, stage 1: Secondary | ICD-10-CM | POA: Diagnosis not present

## 2021-04-24 DIAGNOSIS — G20 Parkinson's disease: Secondary | ICD-10-CM | POA: Diagnosis not present

## 2021-04-24 DIAGNOSIS — Z9181 History of falling: Secondary | ICD-10-CM | POA: Diagnosis not present

## 2021-04-26 DIAGNOSIS — G20 Parkinson's disease: Secondary | ICD-10-CM | POA: Diagnosis not present

## 2021-04-26 DIAGNOSIS — R531 Weakness: Secondary | ICD-10-CM | POA: Diagnosis not present

## 2021-04-26 DIAGNOSIS — Z9181 History of falling: Secondary | ICD-10-CM | POA: Diagnosis not present

## 2021-04-26 DIAGNOSIS — I4891 Unspecified atrial fibrillation: Secondary | ICD-10-CM | POA: Diagnosis not present

## 2021-04-26 DIAGNOSIS — E785 Hyperlipidemia, unspecified: Secondary | ICD-10-CM | POA: Diagnosis not present

## 2021-04-26 DIAGNOSIS — Z950 Presence of cardiac pacemaker: Secondary | ICD-10-CM | POA: Diagnosis not present

## 2021-04-27 DIAGNOSIS — E785 Hyperlipidemia, unspecified: Secondary | ICD-10-CM | POA: Diagnosis not present

## 2021-04-27 DIAGNOSIS — G20 Parkinson's disease: Secondary | ICD-10-CM | POA: Diagnosis not present

## 2021-04-27 DIAGNOSIS — I4891 Unspecified atrial fibrillation: Secondary | ICD-10-CM | POA: Diagnosis not present

## 2021-04-27 DIAGNOSIS — R531 Weakness: Secondary | ICD-10-CM | POA: Diagnosis not present

## 2021-04-27 DIAGNOSIS — Z9181 History of falling: Secondary | ICD-10-CM | POA: Diagnosis not present

## 2021-04-27 DIAGNOSIS — Z950 Presence of cardiac pacemaker: Secondary | ICD-10-CM | POA: Diagnosis not present

## 2021-04-28 DIAGNOSIS — I4891 Unspecified atrial fibrillation: Secondary | ICD-10-CM | POA: Diagnosis not present

## 2021-04-28 DIAGNOSIS — Z9181 History of falling: Secondary | ICD-10-CM | POA: Diagnosis not present

## 2021-04-28 DIAGNOSIS — R531 Weakness: Secondary | ICD-10-CM | POA: Diagnosis not present

## 2021-04-28 DIAGNOSIS — G20 Parkinson's disease: Secondary | ICD-10-CM | POA: Diagnosis not present

## 2021-04-28 DIAGNOSIS — E785 Hyperlipidemia, unspecified: Secondary | ICD-10-CM | POA: Diagnosis not present

## 2021-04-28 DIAGNOSIS — Z950 Presence of cardiac pacemaker: Secondary | ICD-10-CM | POA: Diagnosis not present

## 2021-04-30 DIAGNOSIS — E785 Hyperlipidemia, unspecified: Secondary | ICD-10-CM | POA: Diagnosis not present

## 2021-04-30 DIAGNOSIS — Z9181 History of falling: Secondary | ICD-10-CM | POA: Diagnosis not present

## 2021-04-30 DIAGNOSIS — G20 Parkinson's disease: Secondary | ICD-10-CM | POA: Diagnosis not present

## 2021-04-30 DIAGNOSIS — Z950 Presence of cardiac pacemaker: Secondary | ICD-10-CM | POA: Diagnosis not present

## 2021-04-30 DIAGNOSIS — R531 Weakness: Secondary | ICD-10-CM | POA: Diagnosis not present

## 2021-04-30 DIAGNOSIS — I4891 Unspecified atrial fibrillation: Secondary | ICD-10-CM | POA: Diagnosis not present

## 2021-05-03 DIAGNOSIS — G20 Parkinson's disease: Secondary | ICD-10-CM | POA: Diagnosis not present

## 2021-05-03 DIAGNOSIS — R531 Weakness: Secondary | ICD-10-CM | POA: Diagnosis not present

## 2021-05-03 DIAGNOSIS — I4891 Unspecified atrial fibrillation: Secondary | ICD-10-CM | POA: Diagnosis not present

## 2021-05-03 DIAGNOSIS — Z9181 History of falling: Secondary | ICD-10-CM | POA: Diagnosis not present

## 2021-05-03 DIAGNOSIS — Z950 Presence of cardiac pacemaker: Secondary | ICD-10-CM | POA: Diagnosis not present

## 2021-05-03 DIAGNOSIS — E785 Hyperlipidemia, unspecified: Secondary | ICD-10-CM | POA: Diagnosis not present

## 2021-05-04 DIAGNOSIS — Z950 Presence of cardiac pacemaker: Secondary | ICD-10-CM | POA: Diagnosis not present

## 2021-05-04 DIAGNOSIS — G20 Parkinson's disease: Secondary | ICD-10-CM | POA: Diagnosis not present

## 2021-05-04 DIAGNOSIS — E785 Hyperlipidemia, unspecified: Secondary | ICD-10-CM | POA: Diagnosis not present

## 2021-05-04 DIAGNOSIS — Z9181 History of falling: Secondary | ICD-10-CM | POA: Diagnosis not present

## 2021-05-04 DIAGNOSIS — R531 Weakness: Secondary | ICD-10-CM | POA: Diagnosis not present

## 2021-05-04 DIAGNOSIS — I4891 Unspecified atrial fibrillation: Secondary | ICD-10-CM | POA: Diagnosis not present

## 2021-05-05 DIAGNOSIS — E785 Hyperlipidemia, unspecified: Secondary | ICD-10-CM | POA: Diagnosis not present

## 2021-05-05 DIAGNOSIS — Z950 Presence of cardiac pacemaker: Secondary | ICD-10-CM | POA: Diagnosis not present

## 2021-05-05 DIAGNOSIS — R531 Weakness: Secondary | ICD-10-CM | POA: Diagnosis not present

## 2021-05-05 DIAGNOSIS — Z9181 History of falling: Secondary | ICD-10-CM | POA: Diagnosis not present

## 2021-05-05 DIAGNOSIS — G20 Parkinson's disease: Secondary | ICD-10-CM | POA: Diagnosis not present

## 2021-05-05 DIAGNOSIS — I4891 Unspecified atrial fibrillation: Secondary | ICD-10-CM | POA: Diagnosis not present

## 2021-05-07 DIAGNOSIS — I4891 Unspecified atrial fibrillation: Secondary | ICD-10-CM | POA: Diagnosis not present

## 2021-05-07 DIAGNOSIS — E785 Hyperlipidemia, unspecified: Secondary | ICD-10-CM | POA: Diagnosis not present

## 2021-05-07 DIAGNOSIS — G20 Parkinson's disease: Secondary | ICD-10-CM | POA: Diagnosis not present

## 2021-05-07 DIAGNOSIS — Z950 Presence of cardiac pacemaker: Secondary | ICD-10-CM | POA: Diagnosis not present

## 2021-05-07 DIAGNOSIS — R531 Weakness: Secondary | ICD-10-CM | POA: Diagnosis not present

## 2021-05-07 DIAGNOSIS — Z9181 History of falling: Secondary | ICD-10-CM | POA: Diagnosis not present

## 2021-05-10 DIAGNOSIS — Z950 Presence of cardiac pacemaker: Secondary | ICD-10-CM | POA: Diagnosis not present

## 2021-05-10 DIAGNOSIS — Z9181 History of falling: Secondary | ICD-10-CM | POA: Diagnosis not present

## 2021-05-10 DIAGNOSIS — I4891 Unspecified atrial fibrillation: Secondary | ICD-10-CM | POA: Diagnosis not present

## 2021-05-10 DIAGNOSIS — R531 Weakness: Secondary | ICD-10-CM | POA: Diagnosis not present

## 2021-05-10 DIAGNOSIS — E785 Hyperlipidemia, unspecified: Secondary | ICD-10-CM | POA: Diagnosis not present

## 2021-05-10 DIAGNOSIS — G20 Parkinson's disease: Secondary | ICD-10-CM | POA: Diagnosis not present

## 2021-05-11 DIAGNOSIS — E785 Hyperlipidemia, unspecified: Secondary | ICD-10-CM | POA: Diagnosis not present

## 2021-05-11 DIAGNOSIS — R531 Weakness: Secondary | ICD-10-CM | POA: Diagnosis not present

## 2021-05-11 DIAGNOSIS — Z9181 History of falling: Secondary | ICD-10-CM | POA: Diagnosis not present

## 2021-05-11 DIAGNOSIS — G20 Parkinson's disease: Secondary | ICD-10-CM | POA: Diagnosis not present

## 2021-05-11 DIAGNOSIS — Z950 Presence of cardiac pacemaker: Secondary | ICD-10-CM | POA: Diagnosis not present

## 2021-05-11 DIAGNOSIS — I4891 Unspecified atrial fibrillation: Secondary | ICD-10-CM | POA: Diagnosis not present

## 2021-05-12 DIAGNOSIS — E785 Hyperlipidemia, unspecified: Secondary | ICD-10-CM | POA: Diagnosis not present

## 2021-05-12 DIAGNOSIS — I4891 Unspecified atrial fibrillation: Secondary | ICD-10-CM | POA: Diagnosis not present

## 2021-05-12 DIAGNOSIS — Z9181 History of falling: Secondary | ICD-10-CM | POA: Diagnosis not present

## 2021-05-12 DIAGNOSIS — Z950 Presence of cardiac pacemaker: Secondary | ICD-10-CM | POA: Diagnosis not present

## 2021-05-12 DIAGNOSIS — R531 Weakness: Secondary | ICD-10-CM | POA: Diagnosis not present

## 2021-05-12 DIAGNOSIS — G20 Parkinson's disease: Secondary | ICD-10-CM | POA: Diagnosis not present

## 2021-05-14 DIAGNOSIS — R531 Weakness: Secondary | ICD-10-CM | POA: Diagnosis not present

## 2021-05-14 DIAGNOSIS — G20 Parkinson's disease: Secondary | ICD-10-CM | POA: Diagnosis not present

## 2021-05-14 DIAGNOSIS — E785 Hyperlipidemia, unspecified: Secondary | ICD-10-CM | POA: Diagnosis not present

## 2021-05-14 DIAGNOSIS — Z950 Presence of cardiac pacemaker: Secondary | ICD-10-CM | POA: Diagnosis not present

## 2021-05-14 DIAGNOSIS — I4891 Unspecified atrial fibrillation: Secondary | ICD-10-CM | POA: Diagnosis not present

## 2021-05-14 DIAGNOSIS — Z9181 History of falling: Secondary | ICD-10-CM | POA: Diagnosis not present

## 2021-05-17 DIAGNOSIS — I4891 Unspecified atrial fibrillation: Secondary | ICD-10-CM | POA: Diagnosis not present

## 2021-05-17 DIAGNOSIS — G20 Parkinson's disease: Secondary | ICD-10-CM | POA: Diagnosis not present

## 2021-05-17 DIAGNOSIS — Z950 Presence of cardiac pacemaker: Secondary | ICD-10-CM | POA: Diagnosis not present

## 2021-05-17 DIAGNOSIS — Z9181 History of falling: Secondary | ICD-10-CM | POA: Diagnosis not present

## 2021-05-17 DIAGNOSIS — R531 Weakness: Secondary | ICD-10-CM | POA: Diagnosis not present

## 2021-05-17 DIAGNOSIS — E785 Hyperlipidemia, unspecified: Secondary | ICD-10-CM | POA: Diagnosis not present

## 2021-05-18 DIAGNOSIS — E785 Hyperlipidemia, unspecified: Secondary | ICD-10-CM | POA: Diagnosis not present

## 2021-05-18 DIAGNOSIS — I4891 Unspecified atrial fibrillation: Secondary | ICD-10-CM | POA: Diagnosis not present

## 2021-05-18 DIAGNOSIS — Z9181 History of falling: Secondary | ICD-10-CM | POA: Diagnosis not present

## 2021-05-18 DIAGNOSIS — R531 Weakness: Secondary | ICD-10-CM | POA: Diagnosis not present

## 2021-05-18 DIAGNOSIS — Z950 Presence of cardiac pacemaker: Secondary | ICD-10-CM | POA: Diagnosis not present

## 2021-05-18 DIAGNOSIS — G20 Parkinson's disease: Secondary | ICD-10-CM | POA: Diagnosis not present

## 2021-05-19 DIAGNOSIS — E785 Hyperlipidemia, unspecified: Secondary | ICD-10-CM | POA: Diagnosis not present

## 2021-05-19 DIAGNOSIS — I4891 Unspecified atrial fibrillation: Secondary | ICD-10-CM | POA: Diagnosis not present

## 2021-05-19 DIAGNOSIS — R531 Weakness: Secondary | ICD-10-CM | POA: Diagnosis not present

## 2021-05-19 DIAGNOSIS — Z9181 History of falling: Secondary | ICD-10-CM | POA: Diagnosis not present

## 2021-05-19 DIAGNOSIS — Z950 Presence of cardiac pacemaker: Secondary | ICD-10-CM | POA: Diagnosis not present

## 2021-05-19 DIAGNOSIS — G20 Parkinson's disease: Secondary | ICD-10-CM | POA: Diagnosis not present

## 2021-05-21 DIAGNOSIS — I4891 Unspecified atrial fibrillation: Secondary | ICD-10-CM | POA: Diagnosis not present

## 2021-05-21 DIAGNOSIS — Z950 Presence of cardiac pacemaker: Secondary | ICD-10-CM | POA: Diagnosis not present

## 2021-05-21 DIAGNOSIS — Z9181 History of falling: Secondary | ICD-10-CM | POA: Diagnosis not present

## 2021-05-21 DIAGNOSIS — R531 Weakness: Secondary | ICD-10-CM | POA: Diagnosis not present

## 2021-05-21 DIAGNOSIS — G20 Parkinson's disease: Secondary | ICD-10-CM | POA: Diagnosis not present

## 2021-05-21 DIAGNOSIS — E785 Hyperlipidemia, unspecified: Secondary | ICD-10-CM | POA: Diagnosis not present

## 2021-05-24 DIAGNOSIS — E039 Hypothyroidism, unspecified: Secondary | ICD-10-CM | POA: Diagnosis not present

## 2021-05-24 DIAGNOSIS — L89151 Pressure ulcer of sacral region, stage 1: Secondary | ICD-10-CM | POA: Diagnosis not present

## 2021-05-24 DIAGNOSIS — Z9181 History of falling: Secondary | ICD-10-CM | POA: Diagnosis not present

## 2021-05-24 DIAGNOSIS — G20 Parkinson's disease: Secondary | ICD-10-CM | POA: Diagnosis not present

## 2021-05-24 DIAGNOSIS — I10 Essential (primary) hypertension: Secondary | ICD-10-CM | POA: Diagnosis not present

## 2021-05-24 DIAGNOSIS — E785 Hyperlipidemia, unspecified: Secondary | ICD-10-CM | POA: Diagnosis not present

## 2021-05-24 DIAGNOSIS — R531 Weakness: Secondary | ICD-10-CM | POA: Diagnosis not present

## 2021-05-24 DIAGNOSIS — Z950 Presence of cardiac pacemaker: Secondary | ICD-10-CM | POA: Diagnosis not present

## 2021-05-24 DIAGNOSIS — I4891 Unspecified atrial fibrillation: Secondary | ICD-10-CM | POA: Diagnosis not present

## 2021-05-24 DIAGNOSIS — N3946 Mixed incontinence: Secondary | ICD-10-CM | POA: Diagnosis not present

## 2021-05-25 DIAGNOSIS — I4891 Unspecified atrial fibrillation: Secondary | ICD-10-CM | POA: Diagnosis not present

## 2021-05-25 DIAGNOSIS — Z950 Presence of cardiac pacemaker: Secondary | ICD-10-CM | POA: Diagnosis not present

## 2021-05-25 DIAGNOSIS — G20 Parkinson's disease: Secondary | ICD-10-CM | POA: Diagnosis not present

## 2021-05-25 DIAGNOSIS — E785 Hyperlipidemia, unspecified: Secondary | ICD-10-CM | POA: Diagnosis not present

## 2021-05-25 DIAGNOSIS — Z9181 History of falling: Secondary | ICD-10-CM | POA: Diagnosis not present

## 2021-05-25 DIAGNOSIS — R531 Weakness: Secondary | ICD-10-CM | POA: Diagnosis not present

## 2021-05-26 DIAGNOSIS — Z20822 Contact with and (suspected) exposure to covid-19: Secondary | ICD-10-CM | POA: Diagnosis not present

## 2021-05-26 DIAGNOSIS — Z950 Presence of cardiac pacemaker: Secondary | ICD-10-CM | POA: Diagnosis not present

## 2021-05-26 DIAGNOSIS — R531 Weakness: Secondary | ICD-10-CM | POA: Diagnosis not present

## 2021-05-26 DIAGNOSIS — I4891 Unspecified atrial fibrillation: Secondary | ICD-10-CM | POA: Diagnosis not present

## 2021-05-26 DIAGNOSIS — Z9181 History of falling: Secondary | ICD-10-CM | POA: Diagnosis not present

## 2021-05-26 DIAGNOSIS — G20 Parkinson's disease: Secondary | ICD-10-CM | POA: Diagnosis not present

## 2021-05-26 DIAGNOSIS — E785 Hyperlipidemia, unspecified: Secondary | ICD-10-CM | POA: Diagnosis not present

## 2021-05-28 DIAGNOSIS — G20 Parkinson's disease: Secondary | ICD-10-CM | POA: Diagnosis not present

## 2021-05-28 DIAGNOSIS — E785 Hyperlipidemia, unspecified: Secondary | ICD-10-CM | POA: Diagnosis not present

## 2021-05-28 DIAGNOSIS — Z9181 History of falling: Secondary | ICD-10-CM | POA: Diagnosis not present

## 2021-05-28 DIAGNOSIS — I4891 Unspecified atrial fibrillation: Secondary | ICD-10-CM | POA: Diagnosis not present

## 2021-05-28 DIAGNOSIS — R531 Weakness: Secondary | ICD-10-CM | POA: Diagnosis not present

## 2021-05-28 DIAGNOSIS — Z950 Presence of cardiac pacemaker: Secondary | ICD-10-CM | POA: Diagnosis not present

## 2021-05-31 DIAGNOSIS — Z9181 History of falling: Secondary | ICD-10-CM | POA: Diagnosis not present

## 2021-05-31 DIAGNOSIS — G20 Parkinson's disease: Secondary | ICD-10-CM | POA: Diagnosis not present

## 2021-05-31 DIAGNOSIS — E785 Hyperlipidemia, unspecified: Secondary | ICD-10-CM | POA: Diagnosis not present

## 2021-05-31 DIAGNOSIS — I4891 Unspecified atrial fibrillation: Secondary | ICD-10-CM | POA: Diagnosis not present

## 2021-05-31 DIAGNOSIS — Z950 Presence of cardiac pacemaker: Secondary | ICD-10-CM | POA: Diagnosis not present

## 2021-05-31 DIAGNOSIS — R531 Weakness: Secondary | ICD-10-CM | POA: Diagnosis not present

## 2021-06-01 DIAGNOSIS — R531 Weakness: Secondary | ICD-10-CM | POA: Diagnosis not present

## 2021-06-01 DIAGNOSIS — Z9181 History of falling: Secondary | ICD-10-CM | POA: Diagnosis not present

## 2021-06-01 DIAGNOSIS — G20 Parkinson's disease: Secondary | ICD-10-CM | POA: Diagnosis not present

## 2021-06-01 DIAGNOSIS — E785 Hyperlipidemia, unspecified: Secondary | ICD-10-CM | POA: Diagnosis not present

## 2021-06-01 DIAGNOSIS — I4891 Unspecified atrial fibrillation: Secondary | ICD-10-CM | POA: Diagnosis not present

## 2021-06-01 DIAGNOSIS — Z950 Presence of cardiac pacemaker: Secondary | ICD-10-CM | POA: Diagnosis not present

## 2021-06-02 DIAGNOSIS — R531 Weakness: Secondary | ICD-10-CM | POA: Diagnosis not present

## 2021-06-02 DIAGNOSIS — G20 Parkinson's disease: Secondary | ICD-10-CM | POA: Diagnosis not present

## 2021-06-02 DIAGNOSIS — E785 Hyperlipidemia, unspecified: Secondary | ICD-10-CM | POA: Diagnosis not present

## 2021-06-02 DIAGNOSIS — Z950 Presence of cardiac pacemaker: Secondary | ICD-10-CM | POA: Diagnosis not present

## 2021-06-02 DIAGNOSIS — I4891 Unspecified atrial fibrillation: Secondary | ICD-10-CM | POA: Diagnosis not present

## 2021-06-02 DIAGNOSIS — Z9181 History of falling: Secondary | ICD-10-CM | POA: Diagnosis not present

## 2021-06-04 DIAGNOSIS — I4891 Unspecified atrial fibrillation: Secondary | ICD-10-CM | POA: Diagnosis not present

## 2021-06-04 DIAGNOSIS — Z950 Presence of cardiac pacemaker: Secondary | ICD-10-CM | POA: Diagnosis not present

## 2021-06-04 DIAGNOSIS — G20 Parkinson's disease: Secondary | ICD-10-CM | POA: Diagnosis not present

## 2021-06-04 DIAGNOSIS — R531 Weakness: Secondary | ICD-10-CM | POA: Diagnosis not present

## 2021-06-04 DIAGNOSIS — Z9181 History of falling: Secondary | ICD-10-CM | POA: Diagnosis not present

## 2021-06-04 DIAGNOSIS — E785 Hyperlipidemia, unspecified: Secondary | ICD-10-CM | POA: Diagnosis not present

## 2021-06-07 DIAGNOSIS — E785 Hyperlipidemia, unspecified: Secondary | ICD-10-CM | POA: Diagnosis not present

## 2021-06-07 DIAGNOSIS — R531 Weakness: Secondary | ICD-10-CM | POA: Diagnosis not present

## 2021-06-07 DIAGNOSIS — Z950 Presence of cardiac pacemaker: Secondary | ICD-10-CM | POA: Diagnosis not present

## 2021-06-07 DIAGNOSIS — Z9181 History of falling: Secondary | ICD-10-CM | POA: Diagnosis not present

## 2021-06-07 DIAGNOSIS — G20 Parkinson's disease: Secondary | ICD-10-CM | POA: Diagnosis not present

## 2021-06-07 DIAGNOSIS — I4891 Unspecified atrial fibrillation: Secondary | ICD-10-CM | POA: Diagnosis not present

## 2021-06-08 DIAGNOSIS — R531 Weakness: Secondary | ICD-10-CM | POA: Diagnosis not present

## 2021-06-08 DIAGNOSIS — Z950 Presence of cardiac pacemaker: Secondary | ICD-10-CM | POA: Diagnosis not present

## 2021-06-08 DIAGNOSIS — E785 Hyperlipidemia, unspecified: Secondary | ICD-10-CM | POA: Diagnosis not present

## 2021-06-08 DIAGNOSIS — Z9181 History of falling: Secondary | ICD-10-CM | POA: Diagnosis not present

## 2021-06-08 DIAGNOSIS — I4891 Unspecified atrial fibrillation: Secondary | ICD-10-CM | POA: Diagnosis not present

## 2021-06-08 DIAGNOSIS — G20 Parkinson's disease: Secondary | ICD-10-CM | POA: Diagnosis not present

## 2021-06-09 DIAGNOSIS — I4891 Unspecified atrial fibrillation: Secondary | ICD-10-CM | POA: Diagnosis not present

## 2021-06-09 DIAGNOSIS — Z950 Presence of cardiac pacemaker: Secondary | ICD-10-CM | POA: Diagnosis not present

## 2021-06-09 DIAGNOSIS — G20 Parkinson's disease: Secondary | ICD-10-CM | POA: Diagnosis not present

## 2021-06-09 DIAGNOSIS — E785 Hyperlipidemia, unspecified: Secondary | ICD-10-CM | POA: Diagnosis not present

## 2021-06-09 DIAGNOSIS — Z9181 History of falling: Secondary | ICD-10-CM | POA: Diagnosis not present

## 2021-06-09 DIAGNOSIS — R531 Weakness: Secondary | ICD-10-CM | POA: Diagnosis not present

## 2021-06-11 DIAGNOSIS — R531 Weakness: Secondary | ICD-10-CM | POA: Diagnosis not present

## 2021-06-11 DIAGNOSIS — I4891 Unspecified atrial fibrillation: Secondary | ICD-10-CM | POA: Diagnosis not present

## 2021-06-11 DIAGNOSIS — Z9181 History of falling: Secondary | ICD-10-CM | POA: Diagnosis not present

## 2021-06-11 DIAGNOSIS — G20 Parkinson's disease: Secondary | ICD-10-CM | POA: Diagnosis not present

## 2021-06-11 DIAGNOSIS — Z950 Presence of cardiac pacemaker: Secondary | ICD-10-CM | POA: Diagnosis not present

## 2021-06-11 DIAGNOSIS — E785 Hyperlipidemia, unspecified: Secondary | ICD-10-CM | POA: Diagnosis not present

## 2021-06-14 DIAGNOSIS — I4891 Unspecified atrial fibrillation: Secondary | ICD-10-CM | POA: Diagnosis not present

## 2021-06-14 DIAGNOSIS — G20 Parkinson's disease: Secondary | ICD-10-CM | POA: Diagnosis not present

## 2021-06-14 DIAGNOSIS — Z950 Presence of cardiac pacemaker: Secondary | ICD-10-CM | POA: Diagnosis not present

## 2021-06-14 DIAGNOSIS — Z9181 History of falling: Secondary | ICD-10-CM | POA: Diagnosis not present

## 2021-06-14 DIAGNOSIS — E785 Hyperlipidemia, unspecified: Secondary | ICD-10-CM | POA: Diagnosis not present

## 2021-06-14 DIAGNOSIS — R531 Weakness: Secondary | ICD-10-CM | POA: Diagnosis not present

## 2021-06-15 DIAGNOSIS — Z950 Presence of cardiac pacemaker: Secondary | ICD-10-CM | POA: Diagnosis not present

## 2021-06-15 DIAGNOSIS — I4891 Unspecified atrial fibrillation: Secondary | ICD-10-CM | POA: Diagnosis not present

## 2021-06-15 DIAGNOSIS — R531 Weakness: Secondary | ICD-10-CM | POA: Diagnosis not present

## 2021-06-15 DIAGNOSIS — E785 Hyperlipidemia, unspecified: Secondary | ICD-10-CM | POA: Diagnosis not present

## 2021-06-15 DIAGNOSIS — G20 Parkinson's disease: Secondary | ICD-10-CM | POA: Diagnosis not present

## 2021-06-15 DIAGNOSIS — Z9181 History of falling: Secondary | ICD-10-CM | POA: Diagnosis not present

## 2021-06-16 DIAGNOSIS — I4891 Unspecified atrial fibrillation: Secondary | ICD-10-CM | POA: Diagnosis not present

## 2021-06-16 DIAGNOSIS — Z9181 History of falling: Secondary | ICD-10-CM | POA: Diagnosis not present

## 2021-06-16 DIAGNOSIS — Z950 Presence of cardiac pacemaker: Secondary | ICD-10-CM | POA: Diagnosis not present

## 2021-06-16 DIAGNOSIS — G20 Parkinson's disease: Secondary | ICD-10-CM | POA: Diagnosis not present

## 2021-06-16 DIAGNOSIS — R531 Weakness: Secondary | ICD-10-CM | POA: Diagnosis not present

## 2021-06-16 DIAGNOSIS — E785 Hyperlipidemia, unspecified: Secondary | ICD-10-CM | POA: Diagnosis not present

## 2021-06-18 DIAGNOSIS — G20 Parkinson's disease: Secondary | ICD-10-CM | POA: Diagnosis not present

## 2021-06-18 DIAGNOSIS — E785 Hyperlipidemia, unspecified: Secondary | ICD-10-CM | POA: Diagnosis not present

## 2021-06-18 DIAGNOSIS — Z950 Presence of cardiac pacemaker: Secondary | ICD-10-CM | POA: Diagnosis not present

## 2021-06-18 DIAGNOSIS — Z9181 History of falling: Secondary | ICD-10-CM | POA: Diagnosis not present

## 2021-06-18 DIAGNOSIS — I4891 Unspecified atrial fibrillation: Secondary | ICD-10-CM | POA: Diagnosis not present

## 2021-06-18 DIAGNOSIS — R531 Weakness: Secondary | ICD-10-CM | POA: Diagnosis not present

## 2021-06-21 DIAGNOSIS — E785 Hyperlipidemia, unspecified: Secondary | ICD-10-CM | POA: Diagnosis not present

## 2021-06-21 DIAGNOSIS — Z950 Presence of cardiac pacemaker: Secondary | ICD-10-CM | POA: Diagnosis not present

## 2021-06-21 DIAGNOSIS — I4891 Unspecified atrial fibrillation: Secondary | ICD-10-CM | POA: Diagnosis not present

## 2021-06-21 DIAGNOSIS — R531 Weakness: Secondary | ICD-10-CM | POA: Diagnosis not present

## 2021-06-21 DIAGNOSIS — Z9181 History of falling: Secondary | ICD-10-CM | POA: Diagnosis not present

## 2021-06-21 DIAGNOSIS — G20 Parkinson's disease: Secondary | ICD-10-CM | POA: Diagnosis not present

## 2021-06-22 DIAGNOSIS — G20 Parkinson's disease: Secondary | ICD-10-CM | POA: Diagnosis not present

## 2021-06-22 DIAGNOSIS — R531 Weakness: Secondary | ICD-10-CM | POA: Diagnosis not present

## 2021-06-22 DIAGNOSIS — I4891 Unspecified atrial fibrillation: Secondary | ICD-10-CM | POA: Diagnosis not present

## 2021-06-22 DIAGNOSIS — Z950 Presence of cardiac pacemaker: Secondary | ICD-10-CM | POA: Diagnosis not present

## 2021-06-22 DIAGNOSIS — Z9181 History of falling: Secondary | ICD-10-CM | POA: Diagnosis not present

## 2021-06-22 DIAGNOSIS — E785 Hyperlipidemia, unspecified: Secondary | ICD-10-CM | POA: Diagnosis not present

## 2021-06-23 DIAGNOSIS — R531 Weakness: Secondary | ICD-10-CM | POA: Diagnosis not present

## 2021-06-23 DIAGNOSIS — E785 Hyperlipidemia, unspecified: Secondary | ICD-10-CM | POA: Diagnosis not present

## 2021-06-23 DIAGNOSIS — I4891 Unspecified atrial fibrillation: Secondary | ICD-10-CM | POA: Diagnosis not present

## 2021-06-23 DIAGNOSIS — Z950 Presence of cardiac pacemaker: Secondary | ICD-10-CM | POA: Diagnosis not present

## 2021-06-23 DIAGNOSIS — Z9181 History of falling: Secondary | ICD-10-CM | POA: Diagnosis not present

## 2021-06-23 DIAGNOSIS — G20 Parkinson's disease: Secondary | ICD-10-CM | POA: Diagnosis not present

## 2021-06-24 DIAGNOSIS — Z950 Presence of cardiac pacemaker: Secondary | ICD-10-CM | POA: Diagnosis not present

## 2021-06-24 DIAGNOSIS — E039 Hypothyroidism, unspecified: Secondary | ICD-10-CM | POA: Diagnosis not present

## 2021-06-24 DIAGNOSIS — R531 Weakness: Secondary | ICD-10-CM | POA: Diagnosis not present

## 2021-06-24 DIAGNOSIS — L89151 Pressure ulcer of sacral region, stage 1: Secondary | ICD-10-CM | POA: Diagnosis not present

## 2021-06-24 DIAGNOSIS — G20 Parkinson's disease: Secondary | ICD-10-CM | POA: Diagnosis not present

## 2021-06-24 DIAGNOSIS — E785 Hyperlipidemia, unspecified: Secondary | ICD-10-CM | POA: Diagnosis not present

## 2021-06-24 DIAGNOSIS — I10 Essential (primary) hypertension: Secondary | ICD-10-CM | POA: Diagnosis not present

## 2021-06-24 DIAGNOSIS — Z9181 History of falling: Secondary | ICD-10-CM | POA: Diagnosis not present

## 2021-06-24 DIAGNOSIS — N3946 Mixed incontinence: Secondary | ICD-10-CM | POA: Diagnosis not present

## 2021-06-24 DIAGNOSIS — I4891 Unspecified atrial fibrillation: Secondary | ICD-10-CM | POA: Diagnosis not present

## 2021-06-25 DIAGNOSIS — I4891 Unspecified atrial fibrillation: Secondary | ICD-10-CM | POA: Diagnosis not present

## 2021-06-25 DIAGNOSIS — E785 Hyperlipidemia, unspecified: Secondary | ICD-10-CM | POA: Diagnosis not present

## 2021-06-25 DIAGNOSIS — G20 Parkinson's disease: Secondary | ICD-10-CM | POA: Diagnosis not present

## 2021-06-25 DIAGNOSIS — Z950 Presence of cardiac pacemaker: Secondary | ICD-10-CM | POA: Diagnosis not present

## 2021-06-25 DIAGNOSIS — Z9181 History of falling: Secondary | ICD-10-CM | POA: Diagnosis not present

## 2021-06-25 DIAGNOSIS — R531 Weakness: Secondary | ICD-10-CM | POA: Diagnosis not present

## 2021-06-28 DIAGNOSIS — G20 Parkinson's disease: Secondary | ICD-10-CM | POA: Diagnosis not present

## 2021-06-28 DIAGNOSIS — R531 Weakness: Secondary | ICD-10-CM | POA: Diagnosis not present

## 2021-06-28 DIAGNOSIS — E785 Hyperlipidemia, unspecified: Secondary | ICD-10-CM | POA: Diagnosis not present

## 2021-06-28 DIAGNOSIS — Z950 Presence of cardiac pacemaker: Secondary | ICD-10-CM | POA: Diagnosis not present

## 2021-06-28 DIAGNOSIS — I4891 Unspecified atrial fibrillation: Secondary | ICD-10-CM | POA: Diagnosis not present

## 2021-06-28 DIAGNOSIS — Z9181 History of falling: Secondary | ICD-10-CM | POA: Diagnosis not present

## 2021-06-29 DIAGNOSIS — Z9181 History of falling: Secondary | ICD-10-CM | POA: Diagnosis not present

## 2021-06-29 DIAGNOSIS — E785 Hyperlipidemia, unspecified: Secondary | ICD-10-CM | POA: Diagnosis not present

## 2021-06-29 DIAGNOSIS — R531 Weakness: Secondary | ICD-10-CM | POA: Diagnosis not present

## 2021-06-29 DIAGNOSIS — I4891 Unspecified atrial fibrillation: Secondary | ICD-10-CM | POA: Diagnosis not present

## 2021-06-29 DIAGNOSIS — G20 Parkinson's disease: Secondary | ICD-10-CM | POA: Diagnosis not present

## 2021-06-29 DIAGNOSIS — Z950 Presence of cardiac pacemaker: Secondary | ICD-10-CM | POA: Diagnosis not present

## 2021-06-30 DIAGNOSIS — R531 Weakness: Secondary | ICD-10-CM | POA: Diagnosis not present

## 2021-06-30 DIAGNOSIS — E785 Hyperlipidemia, unspecified: Secondary | ICD-10-CM | POA: Diagnosis not present

## 2021-06-30 DIAGNOSIS — I4891 Unspecified atrial fibrillation: Secondary | ICD-10-CM | POA: Diagnosis not present

## 2021-06-30 DIAGNOSIS — Z950 Presence of cardiac pacemaker: Secondary | ICD-10-CM | POA: Diagnosis not present

## 2021-06-30 DIAGNOSIS — G20 Parkinson's disease: Secondary | ICD-10-CM | POA: Diagnosis not present

## 2021-06-30 DIAGNOSIS — Z9181 History of falling: Secondary | ICD-10-CM | POA: Diagnosis not present

## 2021-07-01 DIAGNOSIS — Z9181 History of falling: Secondary | ICD-10-CM | POA: Diagnosis not present

## 2021-07-01 DIAGNOSIS — Z950 Presence of cardiac pacemaker: Secondary | ICD-10-CM | POA: Diagnosis not present

## 2021-07-01 DIAGNOSIS — G20 Parkinson's disease: Secondary | ICD-10-CM | POA: Diagnosis not present

## 2021-07-01 DIAGNOSIS — E785 Hyperlipidemia, unspecified: Secondary | ICD-10-CM | POA: Diagnosis not present

## 2021-07-01 DIAGNOSIS — I4891 Unspecified atrial fibrillation: Secondary | ICD-10-CM | POA: Diagnosis not present

## 2021-07-01 DIAGNOSIS — R531 Weakness: Secondary | ICD-10-CM | POA: Diagnosis not present

## 2021-07-02 DIAGNOSIS — E785 Hyperlipidemia, unspecified: Secondary | ICD-10-CM | POA: Diagnosis not present

## 2021-07-02 DIAGNOSIS — Z9181 History of falling: Secondary | ICD-10-CM | POA: Diagnosis not present

## 2021-07-02 DIAGNOSIS — I4891 Unspecified atrial fibrillation: Secondary | ICD-10-CM | POA: Diagnosis not present

## 2021-07-02 DIAGNOSIS — R531 Weakness: Secondary | ICD-10-CM | POA: Diagnosis not present

## 2021-07-02 DIAGNOSIS — Z950 Presence of cardiac pacemaker: Secondary | ICD-10-CM | POA: Diagnosis not present

## 2021-07-02 DIAGNOSIS — G20 Parkinson's disease: Secondary | ICD-10-CM | POA: Diagnosis not present

## 2021-07-05 DIAGNOSIS — I4891 Unspecified atrial fibrillation: Secondary | ICD-10-CM | POA: Diagnosis not present

## 2021-07-05 DIAGNOSIS — R531 Weakness: Secondary | ICD-10-CM | POA: Diagnosis not present

## 2021-07-05 DIAGNOSIS — Z950 Presence of cardiac pacemaker: Secondary | ICD-10-CM | POA: Diagnosis not present

## 2021-07-05 DIAGNOSIS — G20 Parkinson's disease: Secondary | ICD-10-CM | POA: Diagnosis not present

## 2021-07-05 DIAGNOSIS — E785 Hyperlipidemia, unspecified: Secondary | ICD-10-CM | POA: Diagnosis not present

## 2021-07-05 DIAGNOSIS — Z9181 History of falling: Secondary | ICD-10-CM | POA: Diagnosis not present

## 2021-07-07 DIAGNOSIS — Z950 Presence of cardiac pacemaker: Secondary | ICD-10-CM | POA: Diagnosis not present

## 2021-07-07 DIAGNOSIS — Z9181 History of falling: Secondary | ICD-10-CM | POA: Diagnosis not present

## 2021-07-07 DIAGNOSIS — I4891 Unspecified atrial fibrillation: Secondary | ICD-10-CM | POA: Diagnosis not present

## 2021-07-07 DIAGNOSIS — E785 Hyperlipidemia, unspecified: Secondary | ICD-10-CM | POA: Diagnosis not present

## 2021-07-07 DIAGNOSIS — R531 Weakness: Secondary | ICD-10-CM | POA: Diagnosis not present

## 2021-07-07 DIAGNOSIS — G20 Parkinson's disease: Secondary | ICD-10-CM | POA: Diagnosis not present

## 2021-07-09 DIAGNOSIS — I4891 Unspecified atrial fibrillation: Secondary | ICD-10-CM | POA: Diagnosis not present

## 2021-07-09 DIAGNOSIS — R531 Weakness: Secondary | ICD-10-CM | POA: Diagnosis not present

## 2021-07-09 DIAGNOSIS — Z950 Presence of cardiac pacemaker: Secondary | ICD-10-CM | POA: Diagnosis not present

## 2021-07-09 DIAGNOSIS — G20 Parkinson's disease: Secondary | ICD-10-CM | POA: Diagnosis not present

## 2021-07-09 DIAGNOSIS — E785 Hyperlipidemia, unspecified: Secondary | ICD-10-CM | POA: Diagnosis not present

## 2021-07-09 DIAGNOSIS — Z9181 History of falling: Secondary | ICD-10-CM | POA: Diagnosis not present

## 2021-07-12 DIAGNOSIS — I4891 Unspecified atrial fibrillation: Secondary | ICD-10-CM | POA: Diagnosis not present

## 2021-07-12 DIAGNOSIS — G20 Parkinson's disease: Secondary | ICD-10-CM | POA: Diagnosis not present

## 2021-07-12 DIAGNOSIS — E785 Hyperlipidemia, unspecified: Secondary | ICD-10-CM | POA: Diagnosis not present

## 2021-07-12 DIAGNOSIS — Z950 Presence of cardiac pacemaker: Secondary | ICD-10-CM | POA: Diagnosis not present

## 2021-07-12 DIAGNOSIS — Z9181 History of falling: Secondary | ICD-10-CM | POA: Diagnosis not present

## 2021-07-12 DIAGNOSIS — R531 Weakness: Secondary | ICD-10-CM | POA: Diagnosis not present

## 2021-07-13 DIAGNOSIS — Z950 Presence of cardiac pacemaker: Secondary | ICD-10-CM | POA: Diagnosis not present

## 2021-07-13 DIAGNOSIS — Z9181 History of falling: Secondary | ICD-10-CM | POA: Diagnosis not present

## 2021-07-13 DIAGNOSIS — I4891 Unspecified atrial fibrillation: Secondary | ICD-10-CM | POA: Diagnosis not present

## 2021-07-13 DIAGNOSIS — E785 Hyperlipidemia, unspecified: Secondary | ICD-10-CM | POA: Diagnosis not present

## 2021-07-13 DIAGNOSIS — R531 Weakness: Secondary | ICD-10-CM | POA: Diagnosis not present

## 2021-07-13 DIAGNOSIS — G20 Parkinson's disease: Secondary | ICD-10-CM | POA: Diagnosis not present

## 2021-07-14 DIAGNOSIS — R531 Weakness: Secondary | ICD-10-CM | POA: Diagnosis not present

## 2021-07-14 DIAGNOSIS — Z9181 History of falling: Secondary | ICD-10-CM | POA: Diagnosis not present

## 2021-07-14 DIAGNOSIS — Z950 Presence of cardiac pacemaker: Secondary | ICD-10-CM | POA: Diagnosis not present

## 2021-07-14 DIAGNOSIS — I4891 Unspecified atrial fibrillation: Secondary | ICD-10-CM | POA: Diagnosis not present

## 2021-07-14 DIAGNOSIS — E785 Hyperlipidemia, unspecified: Secondary | ICD-10-CM | POA: Diagnosis not present

## 2021-07-14 DIAGNOSIS — G20 Parkinson's disease: Secondary | ICD-10-CM | POA: Diagnosis not present

## 2021-07-15 ENCOUNTER — Ambulatory Visit (INDEPENDENT_AMBULATORY_CARE_PROVIDER_SITE_OTHER): Payer: Medicare Other

## 2021-07-15 DIAGNOSIS — I495 Sick sinus syndrome: Secondary | ICD-10-CM

## 2021-07-16 DIAGNOSIS — G20 Parkinson's disease: Secondary | ICD-10-CM | POA: Diagnosis not present

## 2021-07-16 DIAGNOSIS — I4891 Unspecified atrial fibrillation: Secondary | ICD-10-CM | POA: Diagnosis not present

## 2021-07-16 DIAGNOSIS — R531 Weakness: Secondary | ICD-10-CM | POA: Diagnosis not present

## 2021-07-16 DIAGNOSIS — Z9181 History of falling: Secondary | ICD-10-CM | POA: Diagnosis not present

## 2021-07-16 DIAGNOSIS — E785 Hyperlipidemia, unspecified: Secondary | ICD-10-CM | POA: Diagnosis not present

## 2021-07-16 DIAGNOSIS — Z950 Presence of cardiac pacemaker: Secondary | ICD-10-CM | POA: Diagnosis not present

## 2021-07-18 LAB — CUP PACEART REMOTE DEVICE CHECK
Battery Impedance: 1442 Ohm
Battery Remaining Longevity: 49 mo
Battery Voltage: 2.77 V
Brady Statistic AP VP Percent: 0 %
Brady Statistic AP VS Percent: 73 %
Brady Statistic AS VP Percent: 0 %
Brady Statistic AS VS Percent: 26 %
Date Time Interrogation Session: 20230622154926
Implantable Lead Implant Date: 20130611
Implantable Lead Implant Date: 20130611
Implantable Lead Location: 753859
Implantable Lead Location: 753860
Implantable Lead Model: 5076
Implantable Lead Model: 5076
Implantable Pulse Generator Implant Date: 20130611
Lead Channel Impedance Value: 430 Ohm
Lead Channel Impedance Value: 528 Ohm
Lead Channel Pacing Threshold Amplitude: 0.625 V
Lead Channel Pacing Threshold Amplitude: 0.875 V
Lead Channel Pacing Threshold Pulse Width: 0.4 ms
Lead Channel Pacing Threshold Pulse Width: 0.4 ms
Lead Channel Setting Pacing Amplitude: 1.5 V
Lead Channel Setting Pacing Amplitude: 3.75 V
Lead Channel Setting Pacing Pulse Width: 0.52 ms
Lead Channel Setting Sensing Sensitivity: 4 mV

## 2021-07-19 DIAGNOSIS — R531 Weakness: Secondary | ICD-10-CM | POA: Diagnosis not present

## 2021-07-19 DIAGNOSIS — E785 Hyperlipidemia, unspecified: Secondary | ICD-10-CM | POA: Diagnosis not present

## 2021-07-19 DIAGNOSIS — G20 Parkinson's disease: Secondary | ICD-10-CM | POA: Diagnosis not present

## 2021-07-19 DIAGNOSIS — Z9181 History of falling: Secondary | ICD-10-CM | POA: Diagnosis not present

## 2021-07-19 DIAGNOSIS — I4891 Unspecified atrial fibrillation: Secondary | ICD-10-CM | POA: Diagnosis not present

## 2021-07-19 DIAGNOSIS — Z950 Presence of cardiac pacemaker: Secondary | ICD-10-CM | POA: Diagnosis not present

## 2021-07-21 DIAGNOSIS — Z950 Presence of cardiac pacemaker: Secondary | ICD-10-CM | POA: Diagnosis not present

## 2021-07-21 DIAGNOSIS — Z9181 History of falling: Secondary | ICD-10-CM | POA: Diagnosis not present

## 2021-07-21 DIAGNOSIS — E785 Hyperlipidemia, unspecified: Secondary | ICD-10-CM | POA: Diagnosis not present

## 2021-07-21 DIAGNOSIS — R531 Weakness: Secondary | ICD-10-CM | POA: Diagnosis not present

## 2021-07-21 DIAGNOSIS — G20 Parkinson's disease: Secondary | ICD-10-CM | POA: Diagnosis not present

## 2021-07-21 DIAGNOSIS — I4891 Unspecified atrial fibrillation: Secondary | ICD-10-CM | POA: Diagnosis not present

## 2021-07-22 NOTE — Progress Notes (Signed)
Remote pacemaker transmission.   

## 2021-07-23 DIAGNOSIS — Z950 Presence of cardiac pacemaker: Secondary | ICD-10-CM | POA: Diagnosis not present

## 2021-07-23 DIAGNOSIS — E785 Hyperlipidemia, unspecified: Secondary | ICD-10-CM | POA: Diagnosis not present

## 2021-07-23 DIAGNOSIS — I4891 Unspecified atrial fibrillation: Secondary | ICD-10-CM | POA: Diagnosis not present

## 2021-07-23 DIAGNOSIS — G20 Parkinson's disease: Secondary | ICD-10-CM | POA: Diagnosis not present

## 2021-07-23 DIAGNOSIS — R531 Weakness: Secondary | ICD-10-CM | POA: Diagnosis not present

## 2021-07-23 DIAGNOSIS — Z9181 History of falling: Secondary | ICD-10-CM | POA: Diagnosis not present

## 2021-07-24 DIAGNOSIS — N3946 Mixed incontinence: Secondary | ICD-10-CM | POA: Diagnosis not present

## 2021-07-24 DIAGNOSIS — I4891 Unspecified atrial fibrillation: Secondary | ICD-10-CM | POA: Diagnosis not present

## 2021-07-24 DIAGNOSIS — L89151 Pressure ulcer of sacral region, stage 1: Secondary | ICD-10-CM | POA: Diagnosis not present

## 2021-07-24 DIAGNOSIS — Z9181 History of falling: Secondary | ICD-10-CM | POA: Diagnosis not present

## 2021-07-24 DIAGNOSIS — R531 Weakness: Secondary | ICD-10-CM | POA: Diagnosis not present

## 2021-07-24 DIAGNOSIS — I10 Essential (primary) hypertension: Secondary | ICD-10-CM | POA: Diagnosis not present

## 2021-07-24 DIAGNOSIS — E785 Hyperlipidemia, unspecified: Secondary | ICD-10-CM | POA: Diagnosis not present

## 2021-07-24 DIAGNOSIS — G20 Parkinson's disease: Secondary | ICD-10-CM | POA: Diagnosis not present

## 2021-07-24 DIAGNOSIS — E039 Hypothyroidism, unspecified: Secondary | ICD-10-CM | POA: Diagnosis not present

## 2021-07-24 DIAGNOSIS — Z950 Presence of cardiac pacemaker: Secondary | ICD-10-CM | POA: Diagnosis not present

## 2021-07-26 DIAGNOSIS — R531 Weakness: Secondary | ICD-10-CM | POA: Diagnosis not present

## 2021-07-26 DIAGNOSIS — G20 Parkinson's disease: Secondary | ICD-10-CM | POA: Diagnosis not present

## 2021-07-26 DIAGNOSIS — Z950 Presence of cardiac pacemaker: Secondary | ICD-10-CM | POA: Diagnosis not present

## 2021-07-26 DIAGNOSIS — Z9181 History of falling: Secondary | ICD-10-CM | POA: Diagnosis not present

## 2021-07-26 DIAGNOSIS — I4891 Unspecified atrial fibrillation: Secondary | ICD-10-CM | POA: Diagnosis not present

## 2021-07-26 DIAGNOSIS — E785 Hyperlipidemia, unspecified: Secondary | ICD-10-CM | POA: Diagnosis not present

## 2021-07-28 DIAGNOSIS — E785 Hyperlipidemia, unspecified: Secondary | ICD-10-CM | POA: Diagnosis not present

## 2021-07-28 DIAGNOSIS — I4891 Unspecified atrial fibrillation: Secondary | ICD-10-CM | POA: Diagnosis not present

## 2021-07-28 DIAGNOSIS — Z9181 History of falling: Secondary | ICD-10-CM | POA: Diagnosis not present

## 2021-07-28 DIAGNOSIS — Z950 Presence of cardiac pacemaker: Secondary | ICD-10-CM | POA: Diagnosis not present

## 2021-07-28 DIAGNOSIS — G20 Parkinson's disease: Secondary | ICD-10-CM | POA: Diagnosis not present

## 2021-07-28 DIAGNOSIS — R531 Weakness: Secondary | ICD-10-CM | POA: Diagnosis not present

## 2021-07-30 DIAGNOSIS — I4891 Unspecified atrial fibrillation: Secondary | ICD-10-CM | POA: Diagnosis not present

## 2021-07-30 DIAGNOSIS — G20 Parkinson's disease: Secondary | ICD-10-CM | POA: Diagnosis not present

## 2021-07-30 DIAGNOSIS — Z9181 History of falling: Secondary | ICD-10-CM | POA: Diagnosis not present

## 2021-07-30 DIAGNOSIS — Z950 Presence of cardiac pacemaker: Secondary | ICD-10-CM | POA: Diagnosis not present

## 2021-07-30 DIAGNOSIS — R531 Weakness: Secondary | ICD-10-CM | POA: Diagnosis not present

## 2021-07-30 DIAGNOSIS — E785 Hyperlipidemia, unspecified: Secondary | ICD-10-CM | POA: Diagnosis not present

## 2021-08-02 DIAGNOSIS — E785 Hyperlipidemia, unspecified: Secondary | ICD-10-CM | POA: Diagnosis not present

## 2021-08-02 DIAGNOSIS — Z950 Presence of cardiac pacemaker: Secondary | ICD-10-CM | POA: Diagnosis not present

## 2021-08-02 DIAGNOSIS — R531 Weakness: Secondary | ICD-10-CM | POA: Diagnosis not present

## 2021-08-02 DIAGNOSIS — I4891 Unspecified atrial fibrillation: Secondary | ICD-10-CM | POA: Diagnosis not present

## 2021-08-02 DIAGNOSIS — G20 Parkinson's disease: Secondary | ICD-10-CM | POA: Diagnosis not present

## 2021-08-02 DIAGNOSIS — Z9181 History of falling: Secondary | ICD-10-CM | POA: Diagnosis not present

## 2021-08-04 DIAGNOSIS — I4891 Unspecified atrial fibrillation: Secondary | ICD-10-CM | POA: Diagnosis not present

## 2021-08-04 DIAGNOSIS — R531 Weakness: Secondary | ICD-10-CM | POA: Diagnosis not present

## 2021-08-04 DIAGNOSIS — Z9181 History of falling: Secondary | ICD-10-CM | POA: Diagnosis not present

## 2021-08-04 DIAGNOSIS — G20 Parkinson's disease: Secondary | ICD-10-CM | POA: Diagnosis not present

## 2021-08-04 DIAGNOSIS — Z950 Presence of cardiac pacemaker: Secondary | ICD-10-CM | POA: Diagnosis not present

## 2021-08-04 DIAGNOSIS — E785 Hyperlipidemia, unspecified: Secondary | ICD-10-CM | POA: Diagnosis not present

## 2021-08-06 DIAGNOSIS — Z950 Presence of cardiac pacemaker: Secondary | ICD-10-CM | POA: Diagnosis not present

## 2021-08-06 DIAGNOSIS — Z9181 History of falling: Secondary | ICD-10-CM | POA: Diagnosis not present

## 2021-08-06 DIAGNOSIS — R531 Weakness: Secondary | ICD-10-CM | POA: Diagnosis not present

## 2021-08-06 DIAGNOSIS — G20 Parkinson's disease: Secondary | ICD-10-CM | POA: Diagnosis not present

## 2021-08-06 DIAGNOSIS — I4891 Unspecified atrial fibrillation: Secondary | ICD-10-CM | POA: Diagnosis not present

## 2021-08-06 DIAGNOSIS — E785 Hyperlipidemia, unspecified: Secondary | ICD-10-CM | POA: Diagnosis not present

## 2021-08-09 DIAGNOSIS — Z9181 History of falling: Secondary | ICD-10-CM | POA: Diagnosis not present

## 2021-08-09 DIAGNOSIS — G20 Parkinson's disease: Secondary | ICD-10-CM | POA: Diagnosis not present

## 2021-08-09 DIAGNOSIS — R531 Weakness: Secondary | ICD-10-CM | POA: Diagnosis not present

## 2021-08-09 DIAGNOSIS — Z950 Presence of cardiac pacemaker: Secondary | ICD-10-CM | POA: Diagnosis not present

## 2021-08-09 DIAGNOSIS — I4891 Unspecified atrial fibrillation: Secondary | ICD-10-CM | POA: Diagnosis not present

## 2021-08-09 DIAGNOSIS — E785 Hyperlipidemia, unspecified: Secondary | ICD-10-CM | POA: Diagnosis not present

## 2021-08-11 DIAGNOSIS — Z950 Presence of cardiac pacemaker: Secondary | ICD-10-CM | POA: Diagnosis not present

## 2021-08-11 DIAGNOSIS — R531 Weakness: Secondary | ICD-10-CM | POA: Diagnosis not present

## 2021-08-11 DIAGNOSIS — G20 Parkinson's disease: Secondary | ICD-10-CM | POA: Diagnosis not present

## 2021-08-11 DIAGNOSIS — I4891 Unspecified atrial fibrillation: Secondary | ICD-10-CM | POA: Diagnosis not present

## 2021-08-11 DIAGNOSIS — E785 Hyperlipidemia, unspecified: Secondary | ICD-10-CM | POA: Diagnosis not present

## 2021-08-11 DIAGNOSIS — Z9181 History of falling: Secondary | ICD-10-CM | POA: Diagnosis not present

## 2021-08-13 DIAGNOSIS — G20 Parkinson's disease: Secondary | ICD-10-CM | POA: Diagnosis not present

## 2021-08-13 DIAGNOSIS — I4891 Unspecified atrial fibrillation: Secondary | ICD-10-CM | POA: Diagnosis not present

## 2021-08-13 DIAGNOSIS — E785 Hyperlipidemia, unspecified: Secondary | ICD-10-CM | POA: Diagnosis not present

## 2021-08-13 DIAGNOSIS — Z950 Presence of cardiac pacemaker: Secondary | ICD-10-CM | POA: Diagnosis not present

## 2021-08-13 DIAGNOSIS — Z9181 History of falling: Secondary | ICD-10-CM | POA: Diagnosis not present

## 2021-08-13 DIAGNOSIS — R531 Weakness: Secondary | ICD-10-CM | POA: Diagnosis not present

## 2021-08-16 DIAGNOSIS — R531 Weakness: Secondary | ICD-10-CM | POA: Diagnosis not present

## 2021-08-16 DIAGNOSIS — I4891 Unspecified atrial fibrillation: Secondary | ICD-10-CM | POA: Diagnosis not present

## 2021-08-16 DIAGNOSIS — Z9181 History of falling: Secondary | ICD-10-CM | POA: Diagnosis not present

## 2021-08-16 DIAGNOSIS — Z950 Presence of cardiac pacemaker: Secondary | ICD-10-CM | POA: Diagnosis not present

## 2021-08-16 DIAGNOSIS — E785 Hyperlipidemia, unspecified: Secondary | ICD-10-CM | POA: Diagnosis not present

## 2021-08-16 DIAGNOSIS — G20 Parkinson's disease: Secondary | ICD-10-CM | POA: Diagnosis not present

## 2021-08-18 DIAGNOSIS — G20 Parkinson's disease: Secondary | ICD-10-CM | POA: Diagnosis not present

## 2021-08-18 DIAGNOSIS — R531 Weakness: Secondary | ICD-10-CM | POA: Diagnosis not present

## 2021-08-18 DIAGNOSIS — I4891 Unspecified atrial fibrillation: Secondary | ICD-10-CM | POA: Diagnosis not present

## 2021-08-18 DIAGNOSIS — Z950 Presence of cardiac pacemaker: Secondary | ICD-10-CM | POA: Diagnosis not present

## 2021-08-18 DIAGNOSIS — E785 Hyperlipidemia, unspecified: Secondary | ICD-10-CM | POA: Diagnosis not present

## 2021-08-18 DIAGNOSIS — Z9181 History of falling: Secondary | ICD-10-CM | POA: Diagnosis not present

## 2021-08-20 DIAGNOSIS — I4891 Unspecified atrial fibrillation: Secondary | ICD-10-CM | POA: Diagnosis not present

## 2021-08-20 DIAGNOSIS — G20 Parkinson's disease: Secondary | ICD-10-CM | POA: Diagnosis not present

## 2021-08-20 DIAGNOSIS — E785 Hyperlipidemia, unspecified: Secondary | ICD-10-CM | POA: Diagnosis not present

## 2021-08-20 DIAGNOSIS — Z9181 History of falling: Secondary | ICD-10-CM | POA: Diagnosis not present

## 2021-08-20 DIAGNOSIS — Z950 Presence of cardiac pacemaker: Secondary | ICD-10-CM | POA: Diagnosis not present

## 2021-08-20 DIAGNOSIS — R531 Weakness: Secondary | ICD-10-CM | POA: Diagnosis not present

## 2021-08-23 DIAGNOSIS — I4891 Unspecified atrial fibrillation: Secondary | ICD-10-CM | POA: Diagnosis not present

## 2021-08-23 DIAGNOSIS — Z9181 History of falling: Secondary | ICD-10-CM | POA: Diagnosis not present

## 2021-08-23 DIAGNOSIS — R531 Weakness: Secondary | ICD-10-CM | POA: Diagnosis not present

## 2021-08-23 DIAGNOSIS — E785 Hyperlipidemia, unspecified: Secondary | ICD-10-CM | POA: Diagnosis not present

## 2021-08-23 DIAGNOSIS — Z950 Presence of cardiac pacemaker: Secondary | ICD-10-CM | POA: Diagnosis not present

## 2021-08-23 DIAGNOSIS — G20 Parkinson's disease: Secondary | ICD-10-CM | POA: Diagnosis not present

## 2021-08-24 DIAGNOSIS — E039 Hypothyroidism, unspecified: Secondary | ICD-10-CM | POA: Diagnosis not present

## 2021-08-24 DIAGNOSIS — I10 Essential (primary) hypertension: Secondary | ICD-10-CM | POA: Diagnosis not present

## 2021-08-24 DIAGNOSIS — G20 Parkinson's disease: Secondary | ICD-10-CM | POA: Diagnosis not present

## 2021-08-24 DIAGNOSIS — I4891 Unspecified atrial fibrillation: Secondary | ICD-10-CM | POA: Diagnosis not present

## 2021-08-24 DIAGNOSIS — R531 Weakness: Secondary | ICD-10-CM | POA: Diagnosis not present

## 2021-08-24 DIAGNOSIS — N3946 Mixed incontinence: Secondary | ICD-10-CM | POA: Diagnosis not present

## 2021-08-24 DIAGNOSIS — E785 Hyperlipidemia, unspecified: Secondary | ICD-10-CM | POA: Diagnosis not present

## 2021-08-24 DIAGNOSIS — Z950 Presence of cardiac pacemaker: Secondary | ICD-10-CM | POA: Diagnosis not present

## 2021-08-24 DIAGNOSIS — L89151 Pressure ulcer of sacral region, stage 1: Secondary | ICD-10-CM | POA: Diagnosis not present

## 2021-08-24 DIAGNOSIS — Z9181 History of falling: Secondary | ICD-10-CM | POA: Diagnosis not present

## 2021-08-25 DIAGNOSIS — I4891 Unspecified atrial fibrillation: Secondary | ICD-10-CM | POA: Diagnosis not present

## 2021-08-25 DIAGNOSIS — E785 Hyperlipidemia, unspecified: Secondary | ICD-10-CM | POA: Diagnosis not present

## 2021-08-25 DIAGNOSIS — G20 Parkinson's disease: Secondary | ICD-10-CM | POA: Diagnosis not present

## 2021-08-25 DIAGNOSIS — R531 Weakness: Secondary | ICD-10-CM | POA: Diagnosis not present

## 2021-08-25 DIAGNOSIS — Z950 Presence of cardiac pacemaker: Secondary | ICD-10-CM | POA: Diagnosis not present

## 2021-08-25 DIAGNOSIS — Z9181 History of falling: Secondary | ICD-10-CM | POA: Diagnosis not present

## 2021-08-27 DIAGNOSIS — Z9181 History of falling: Secondary | ICD-10-CM | POA: Diagnosis not present

## 2021-08-27 DIAGNOSIS — R531 Weakness: Secondary | ICD-10-CM | POA: Diagnosis not present

## 2021-08-27 DIAGNOSIS — E785 Hyperlipidemia, unspecified: Secondary | ICD-10-CM | POA: Diagnosis not present

## 2021-08-27 DIAGNOSIS — G20 Parkinson's disease: Secondary | ICD-10-CM | POA: Diagnosis not present

## 2021-08-27 DIAGNOSIS — Z950 Presence of cardiac pacemaker: Secondary | ICD-10-CM | POA: Diagnosis not present

## 2021-08-27 DIAGNOSIS — I4891 Unspecified atrial fibrillation: Secondary | ICD-10-CM | POA: Diagnosis not present

## 2021-08-30 DIAGNOSIS — G20 Parkinson's disease: Secondary | ICD-10-CM | POA: Diagnosis not present

## 2021-08-30 DIAGNOSIS — Z9181 History of falling: Secondary | ICD-10-CM | POA: Diagnosis not present

## 2021-08-30 DIAGNOSIS — I4891 Unspecified atrial fibrillation: Secondary | ICD-10-CM | POA: Diagnosis not present

## 2021-08-30 DIAGNOSIS — E785 Hyperlipidemia, unspecified: Secondary | ICD-10-CM | POA: Diagnosis not present

## 2021-08-30 DIAGNOSIS — R531 Weakness: Secondary | ICD-10-CM | POA: Diagnosis not present

## 2021-08-30 DIAGNOSIS — Z950 Presence of cardiac pacemaker: Secondary | ICD-10-CM | POA: Diagnosis not present

## 2021-09-01 DIAGNOSIS — R531 Weakness: Secondary | ICD-10-CM | POA: Diagnosis not present

## 2021-09-01 DIAGNOSIS — Z9181 History of falling: Secondary | ICD-10-CM | POA: Diagnosis not present

## 2021-09-01 DIAGNOSIS — I4891 Unspecified atrial fibrillation: Secondary | ICD-10-CM | POA: Diagnosis not present

## 2021-09-01 DIAGNOSIS — E785 Hyperlipidemia, unspecified: Secondary | ICD-10-CM | POA: Diagnosis not present

## 2021-09-01 DIAGNOSIS — G20 Parkinson's disease: Secondary | ICD-10-CM | POA: Diagnosis not present

## 2021-09-01 DIAGNOSIS — Z950 Presence of cardiac pacemaker: Secondary | ICD-10-CM | POA: Diagnosis not present

## 2021-09-03 DIAGNOSIS — Z950 Presence of cardiac pacemaker: Secondary | ICD-10-CM | POA: Diagnosis not present

## 2021-09-03 DIAGNOSIS — E785 Hyperlipidemia, unspecified: Secondary | ICD-10-CM | POA: Diagnosis not present

## 2021-09-03 DIAGNOSIS — G20 Parkinson's disease: Secondary | ICD-10-CM | POA: Diagnosis not present

## 2021-09-03 DIAGNOSIS — I4891 Unspecified atrial fibrillation: Secondary | ICD-10-CM | POA: Diagnosis not present

## 2021-09-03 DIAGNOSIS — Z9181 History of falling: Secondary | ICD-10-CM | POA: Diagnosis not present

## 2021-09-03 DIAGNOSIS — R531 Weakness: Secondary | ICD-10-CM | POA: Diagnosis not present

## 2021-09-06 DIAGNOSIS — G20 Parkinson's disease: Secondary | ICD-10-CM | POA: Diagnosis not present

## 2021-09-06 DIAGNOSIS — I4891 Unspecified atrial fibrillation: Secondary | ICD-10-CM | POA: Diagnosis not present

## 2021-09-06 DIAGNOSIS — Z9181 History of falling: Secondary | ICD-10-CM | POA: Diagnosis not present

## 2021-09-06 DIAGNOSIS — R531 Weakness: Secondary | ICD-10-CM | POA: Diagnosis not present

## 2021-09-06 DIAGNOSIS — Z950 Presence of cardiac pacemaker: Secondary | ICD-10-CM | POA: Diagnosis not present

## 2021-09-06 DIAGNOSIS — E785 Hyperlipidemia, unspecified: Secondary | ICD-10-CM | POA: Diagnosis not present

## 2021-09-08 DIAGNOSIS — Z9181 History of falling: Secondary | ICD-10-CM | POA: Diagnosis not present

## 2021-09-08 DIAGNOSIS — I4891 Unspecified atrial fibrillation: Secondary | ICD-10-CM | POA: Diagnosis not present

## 2021-09-08 DIAGNOSIS — E785 Hyperlipidemia, unspecified: Secondary | ICD-10-CM | POA: Diagnosis not present

## 2021-09-08 DIAGNOSIS — G20 Parkinson's disease: Secondary | ICD-10-CM | POA: Diagnosis not present

## 2021-09-08 DIAGNOSIS — Z950 Presence of cardiac pacemaker: Secondary | ICD-10-CM | POA: Diagnosis not present

## 2021-09-08 DIAGNOSIS — R531 Weakness: Secondary | ICD-10-CM | POA: Diagnosis not present

## 2021-09-10 DIAGNOSIS — R531 Weakness: Secondary | ICD-10-CM | POA: Diagnosis not present

## 2021-09-10 DIAGNOSIS — I4891 Unspecified atrial fibrillation: Secondary | ICD-10-CM | POA: Diagnosis not present

## 2021-09-10 DIAGNOSIS — Z950 Presence of cardiac pacemaker: Secondary | ICD-10-CM | POA: Diagnosis not present

## 2021-09-10 DIAGNOSIS — G20 Parkinson's disease: Secondary | ICD-10-CM | POA: Diagnosis not present

## 2021-09-10 DIAGNOSIS — E785 Hyperlipidemia, unspecified: Secondary | ICD-10-CM | POA: Diagnosis not present

## 2021-09-10 DIAGNOSIS — Z9181 History of falling: Secondary | ICD-10-CM | POA: Diagnosis not present

## 2021-09-13 DIAGNOSIS — Z9181 History of falling: Secondary | ICD-10-CM | POA: Diagnosis not present

## 2021-09-13 DIAGNOSIS — Z950 Presence of cardiac pacemaker: Secondary | ICD-10-CM | POA: Diagnosis not present

## 2021-09-13 DIAGNOSIS — G20 Parkinson's disease: Secondary | ICD-10-CM | POA: Diagnosis not present

## 2021-09-13 DIAGNOSIS — R531 Weakness: Secondary | ICD-10-CM | POA: Diagnosis not present

## 2021-09-13 DIAGNOSIS — I4891 Unspecified atrial fibrillation: Secondary | ICD-10-CM | POA: Diagnosis not present

## 2021-09-13 DIAGNOSIS — E785 Hyperlipidemia, unspecified: Secondary | ICD-10-CM | POA: Diagnosis not present

## 2021-09-15 DIAGNOSIS — E785 Hyperlipidemia, unspecified: Secondary | ICD-10-CM | POA: Diagnosis not present

## 2021-09-15 DIAGNOSIS — R531 Weakness: Secondary | ICD-10-CM | POA: Diagnosis not present

## 2021-09-15 DIAGNOSIS — Z950 Presence of cardiac pacemaker: Secondary | ICD-10-CM | POA: Diagnosis not present

## 2021-09-15 DIAGNOSIS — I4891 Unspecified atrial fibrillation: Secondary | ICD-10-CM | POA: Diagnosis not present

## 2021-09-15 DIAGNOSIS — Z9181 History of falling: Secondary | ICD-10-CM | POA: Diagnosis not present

## 2021-09-15 DIAGNOSIS — G20 Parkinson's disease: Secondary | ICD-10-CM | POA: Diagnosis not present

## 2021-09-17 DIAGNOSIS — I4891 Unspecified atrial fibrillation: Secondary | ICD-10-CM | POA: Diagnosis not present

## 2021-09-17 DIAGNOSIS — Z950 Presence of cardiac pacemaker: Secondary | ICD-10-CM | POA: Diagnosis not present

## 2021-09-17 DIAGNOSIS — R531 Weakness: Secondary | ICD-10-CM | POA: Diagnosis not present

## 2021-09-17 DIAGNOSIS — E785 Hyperlipidemia, unspecified: Secondary | ICD-10-CM | POA: Diagnosis not present

## 2021-09-17 DIAGNOSIS — Z9181 History of falling: Secondary | ICD-10-CM | POA: Diagnosis not present

## 2021-09-17 DIAGNOSIS — G20 Parkinson's disease: Secondary | ICD-10-CM | POA: Diagnosis not present

## 2021-09-20 DIAGNOSIS — I4891 Unspecified atrial fibrillation: Secondary | ICD-10-CM | POA: Diagnosis not present

## 2021-09-20 DIAGNOSIS — Z9181 History of falling: Secondary | ICD-10-CM | POA: Diagnosis not present

## 2021-09-20 DIAGNOSIS — R531 Weakness: Secondary | ICD-10-CM | POA: Diagnosis not present

## 2021-09-20 DIAGNOSIS — Z950 Presence of cardiac pacemaker: Secondary | ICD-10-CM | POA: Diagnosis not present

## 2021-09-20 DIAGNOSIS — G20 Parkinson's disease: Secondary | ICD-10-CM | POA: Diagnosis not present

## 2021-09-20 DIAGNOSIS — E785 Hyperlipidemia, unspecified: Secondary | ICD-10-CM | POA: Diagnosis not present

## 2021-09-21 DIAGNOSIS — G20 Parkinson's disease: Secondary | ICD-10-CM | POA: Diagnosis not present

## 2021-09-21 DIAGNOSIS — R531 Weakness: Secondary | ICD-10-CM | POA: Diagnosis not present

## 2021-09-21 DIAGNOSIS — E785 Hyperlipidemia, unspecified: Secondary | ICD-10-CM | POA: Diagnosis not present

## 2021-09-21 DIAGNOSIS — Z950 Presence of cardiac pacemaker: Secondary | ICD-10-CM | POA: Diagnosis not present

## 2021-09-21 DIAGNOSIS — Z9181 History of falling: Secondary | ICD-10-CM | POA: Diagnosis not present

## 2021-09-21 DIAGNOSIS — I4891 Unspecified atrial fibrillation: Secondary | ICD-10-CM | POA: Diagnosis not present

## 2021-09-22 DIAGNOSIS — R531 Weakness: Secondary | ICD-10-CM | POA: Diagnosis not present

## 2021-09-22 DIAGNOSIS — G20 Parkinson's disease: Secondary | ICD-10-CM | POA: Diagnosis not present

## 2021-09-22 DIAGNOSIS — I4891 Unspecified atrial fibrillation: Secondary | ICD-10-CM | POA: Diagnosis not present

## 2021-09-22 DIAGNOSIS — Z9181 History of falling: Secondary | ICD-10-CM | POA: Diagnosis not present

## 2021-09-22 DIAGNOSIS — Z950 Presence of cardiac pacemaker: Secondary | ICD-10-CM | POA: Diagnosis not present

## 2021-09-22 DIAGNOSIS — E785 Hyperlipidemia, unspecified: Secondary | ICD-10-CM | POA: Diagnosis not present

## 2021-09-24 DIAGNOSIS — E785 Hyperlipidemia, unspecified: Secondary | ICD-10-CM | POA: Diagnosis not present

## 2021-09-24 DIAGNOSIS — E039 Hypothyroidism, unspecified: Secondary | ICD-10-CM | POA: Diagnosis not present

## 2021-09-24 DIAGNOSIS — Z950 Presence of cardiac pacemaker: Secondary | ICD-10-CM | POA: Diagnosis not present

## 2021-09-24 DIAGNOSIS — I10 Essential (primary) hypertension: Secondary | ICD-10-CM | POA: Diagnosis not present

## 2021-09-24 DIAGNOSIS — I4891 Unspecified atrial fibrillation: Secondary | ICD-10-CM | POA: Diagnosis not present

## 2021-09-24 DIAGNOSIS — R531 Weakness: Secondary | ICD-10-CM | POA: Diagnosis not present

## 2021-09-24 DIAGNOSIS — Z9181 History of falling: Secondary | ICD-10-CM | POA: Diagnosis not present

## 2021-09-24 DIAGNOSIS — N3946 Mixed incontinence: Secondary | ICD-10-CM | POA: Diagnosis not present

## 2021-09-24 DIAGNOSIS — G20 Parkinson's disease: Secondary | ICD-10-CM | POA: Diagnosis not present

## 2021-09-24 DIAGNOSIS — L89151 Pressure ulcer of sacral region, stage 1: Secondary | ICD-10-CM | POA: Diagnosis not present

## 2021-09-29 DIAGNOSIS — Z9181 History of falling: Secondary | ICD-10-CM | POA: Diagnosis not present

## 2021-09-29 DIAGNOSIS — E785 Hyperlipidemia, unspecified: Secondary | ICD-10-CM | POA: Diagnosis not present

## 2021-09-29 DIAGNOSIS — R531 Weakness: Secondary | ICD-10-CM | POA: Diagnosis not present

## 2021-09-29 DIAGNOSIS — I4891 Unspecified atrial fibrillation: Secondary | ICD-10-CM | POA: Diagnosis not present

## 2021-09-29 DIAGNOSIS — G20 Parkinson's disease: Secondary | ICD-10-CM | POA: Diagnosis not present

## 2021-09-29 DIAGNOSIS — Z950 Presence of cardiac pacemaker: Secondary | ICD-10-CM | POA: Diagnosis not present

## 2021-10-01 DIAGNOSIS — Z950 Presence of cardiac pacemaker: Secondary | ICD-10-CM | POA: Diagnosis not present

## 2021-10-01 DIAGNOSIS — Z9181 History of falling: Secondary | ICD-10-CM | POA: Diagnosis not present

## 2021-10-01 DIAGNOSIS — G20 Parkinson's disease: Secondary | ICD-10-CM | POA: Diagnosis not present

## 2021-10-01 DIAGNOSIS — R531 Weakness: Secondary | ICD-10-CM | POA: Diagnosis not present

## 2021-10-01 DIAGNOSIS — I4891 Unspecified atrial fibrillation: Secondary | ICD-10-CM | POA: Diagnosis not present

## 2021-10-01 DIAGNOSIS — E785 Hyperlipidemia, unspecified: Secondary | ICD-10-CM | POA: Diagnosis not present

## 2021-10-04 DIAGNOSIS — R531 Weakness: Secondary | ICD-10-CM | POA: Diagnosis not present

## 2021-10-04 DIAGNOSIS — I4891 Unspecified atrial fibrillation: Secondary | ICD-10-CM | POA: Diagnosis not present

## 2021-10-04 DIAGNOSIS — Z9181 History of falling: Secondary | ICD-10-CM | POA: Diagnosis not present

## 2021-10-04 DIAGNOSIS — G20 Parkinson's disease: Secondary | ICD-10-CM | POA: Diagnosis not present

## 2021-10-04 DIAGNOSIS — Z950 Presence of cardiac pacemaker: Secondary | ICD-10-CM | POA: Diagnosis not present

## 2021-10-04 DIAGNOSIS — E785 Hyperlipidemia, unspecified: Secondary | ICD-10-CM | POA: Diagnosis not present

## 2021-10-06 DIAGNOSIS — R531 Weakness: Secondary | ICD-10-CM | POA: Diagnosis not present

## 2021-10-06 DIAGNOSIS — Z9181 History of falling: Secondary | ICD-10-CM | POA: Diagnosis not present

## 2021-10-06 DIAGNOSIS — G20 Parkinson's disease: Secondary | ICD-10-CM | POA: Diagnosis not present

## 2021-10-06 DIAGNOSIS — I4891 Unspecified atrial fibrillation: Secondary | ICD-10-CM | POA: Diagnosis not present

## 2021-10-06 DIAGNOSIS — Z950 Presence of cardiac pacemaker: Secondary | ICD-10-CM | POA: Diagnosis not present

## 2021-10-06 DIAGNOSIS — E785 Hyperlipidemia, unspecified: Secondary | ICD-10-CM | POA: Diagnosis not present

## 2021-10-08 DIAGNOSIS — I4891 Unspecified atrial fibrillation: Secondary | ICD-10-CM | POA: Diagnosis not present

## 2021-10-08 DIAGNOSIS — Z9181 History of falling: Secondary | ICD-10-CM | POA: Diagnosis not present

## 2021-10-08 DIAGNOSIS — Z950 Presence of cardiac pacemaker: Secondary | ICD-10-CM | POA: Diagnosis not present

## 2021-10-08 DIAGNOSIS — G20 Parkinson's disease: Secondary | ICD-10-CM | POA: Diagnosis not present

## 2021-10-08 DIAGNOSIS — R531 Weakness: Secondary | ICD-10-CM | POA: Diagnosis not present

## 2021-10-08 DIAGNOSIS — E785 Hyperlipidemia, unspecified: Secondary | ICD-10-CM | POA: Diagnosis not present

## 2021-10-11 DIAGNOSIS — E785 Hyperlipidemia, unspecified: Secondary | ICD-10-CM | POA: Diagnosis not present

## 2021-10-11 DIAGNOSIS — G20 Parkinson's disease: Secondary | ICD-10-CM | POA: Diagnosis not present

## 2021-10-11 DIAGNOSIS — R531 Weakness: Secondary | ICD-10-CM | POA: Diagnosis not present

## 2021-10-11 DIAGNOSIS — Z950 Presence of cardiac pacemaker: Secondary | ICD-10-CM | POA: Diagnosis not present

## 2021-10-11 DIAGNOSIS — Z9181 History of falling: Secondary | ICD-10-CM | POA: Diagnosis not present

## 2021-10-11 DIAGNOSIS — I4891 Unspecified atrial fibrillation: Secondary | ICD-10-CM | POA: Diagnosis not present

## 2021-10-13 DIAGNOSIS — E785 Hyperlipidemia, unspecified: Secondary | ICD-10-CM | POA: Diagnosis not present

## 2021-10-13 DIAGNOSIS — G20 Parkinson's disease: Secondary | ICD-10-CM | POA: Diagnosis not present

## 2021-10-13 DIAGNOSIS — Z9181 History of falling: Secondary | ICD-10-CM | POA: Diagnosis not present

## 2021-10-13 DIAGNOSIS — I4891 Unspecified atrial fibrillation: Secondary | ICD-10-CM | POA: Diagnosis not present

## 2021-10-13 DIAGNOSIS — Z950 Presence of cardiac pacemaker: Secondary | ICD-10-CM | POA: Diagnosis not present

## 2021-10-13 DIAGNOSIS — R531 Weakness: Secondary | ICD-10-CM | POA: Diagnosis not present

## 2021-10-14 ENCOUNTER — Ambulatory Visit (INDEPENDENT_AMBULATORY_CARE_PROVIDER_SITE_OTHER): Payer: Medicare Other

## 2021-10-14 DIAGNOSIS — I495 Sick sinus syndrome: Secondary | ICD-10-CM | POA: Diagnosis not present

## 2021-10-15 DIAGNOSIS — G20 Parkinson's disease: Secondary | ICD-10-CM | POA: Diagnosis not present

## 2021-10-15 DIAGNOSIS — I4891 Unspecified atrial fibrillation: Secondary | ICD-10-CM | POA: Diagnosis not present

## 2021-10-15 DIAGNOSIS — R531 Weakness: Secondary | ICD-10-CM | POA: Diagnosis not present

## 2021-10-15 DIAGNOSIS — Z950 Presence of cardiac pacemaker: Secondary | ICD-10-CM | POA: Diagnosis not present

## 2021-10-15 DIAGNOSIS — E785 Hyperlipidemia, unspecified: Secondary | ICD-10-CM | POA: Diagnosis not present

## 2021-10-15 DIAGNOSIS — Z9181 History of falling: Secondary | ICD-10-CM | POA: Diagnosis not present

## 2021-10-15 LAB — CUP PACEART REMOTE DEVICE CHECK
Battery Impedance: 1584 Ohm
Battery Remaining Longevity: 44 mo
Battery Voltage: 2.75 V
Brady Statistic AP VP Percent: 0 %
Brady Statistic AP VS Percent: 69 %
Brady Statistic AS VP Percent: 0 %
Brady Statistic AS VS Percent: 31 %
Date Time Interrogation Session: 20230921065719
Implantable Lead Implant Date: 20130611
Implantable Lead Implant Date: 20130611
Implantable Lead Location: 753859
Implantable Lead Location: 753860
Implantable Lead Model: 5076
Implantable Lead Model: 5076
Implantable Pulse Generator Implant Date: 20130611
Lead Channel Impedance Value: 436 Ohm
Lead Channel Impedance Value: 444 Ohm
Lead Channel Pacing Threshold Amplitude: 0.625 V
Lead Channel Pacing Threshold Amplitude: 0.625 V
Lead Channel Pacing Threshold Pulse Width: 0.4 ms
Lead Channel Pacing Threshold Pulse Width: 0.4 ms
Lead Channel Setting Pacing Amplitude: 1.5 V
Lead Channel Setting Pacing Amplitude: 3.75 V
Lead Channel Setting Pacing Pulse Width: 0.52 ms
Lead Channel Setting Sensing Sensitivity: 2.8 mV

## 2021-10-18 DIAGNOSIS — Z950 Presence of cardiac pacemaker: Secondary | ICD-10-CM | POA: Diagnosis not present

## 2021-10-18 DIAGNOSIS — E785 Hyperlipidemia, unspecified: Secondary | ICD-10-CM | POA: Diagnosis not present

## 2021-10-18 DIAGNOSIS — R531 Weakness: Secondary | ICD-10-CM | POA: Diagnosis not present

## 2021-10-18 DIAGNOSIS — Z9181 History of falling: Secondary | ICD-10-CM | POA: Diagnosis not present

## 2021-10-18 DIAGNOSIS — I4891 Unspecified atrial fibrillation: Secondary | ICD-10-CM | POA: Diagnosis not present

## 2021-10-18 DIAGNOSIS — G20 Parkinson's disease: Secondary | ICD-10-CM | POA: Diagnosis not present

## 2021-10-20 DIAGNOSIS — I4891 Unspecified atrial fibrillation: Secondary | ICD-10-CM | POA: Diagnosis not present

## 2021-10-20 DIAGNOSIS — Z950 Presence of cardiac pacemaker: Secondary | ICD-10-CM | POA: Diagnosis not present

## 2021-10-20 DIAGNOSIS — G20 Parkinson's disease: Secondary | ICD-10-CM | POA: Diagnosis not present

## 2021-10-20 DIAGNOSIS — Z9181 History of falling: Secondary | ICD-10-CM | POA: Diagnosis not present

## 2021-10-20 DIAGNOSIS — R531 Weakness: Secondary | ICD-10-CM | POA: Diagnosis not present

## 2021-10-20 DIAGNOSIS — E785 Hyperlipidemia, unspecified: Secondary | ICD-10-CM | POA: Diagnosis not present

## 2021-10-21 DIAGNOSIS — E785 Hyperlipidemia, unspecified: Secondary | ICD-10-CM | POA: Diagnosis not present

## 2021-10-21 DIAGNOSIS — G20 Parkinson's disease: Secondary | ICD-10-CM | POA: Diagnosis not present

## 2021-10-21 DIAGNOSIS — I4891 Unspecified atrial fibrillation: Secondary | ICD-10-CM | POA: Diagnosis not present

## 2021-10-21 DIAGNOSIS — Z950 Presence of cardiac pacemaker: Secondary | ICD-10-CM | POA: Diagnosis not present

## 2021-10-21 DIAGNOSIS — R531 Weakness: Secondary | ICD-10-CM | POA: Diagnosis not present

## 2021-10-21 DIAGNOSIS — Z9181 History of falling: Secondary | ICD-10-CM | POA: Diagnosis not present

## 2021-10-22 DIAGNOSIS — E785 Hyperlipidemia, unspecified: Secondary | ICD-10-CM | POA: Diagnosis not present

## 2021-10-22 DIAGNOSIS — Z950 Presence of cardiac pacemaker: Secondary | ICD-10-CM | POA: Diagnosis not present

## 2021-10-22 DIAGNOSIS — Z9181 History of falling: Secondary | ICD-10-CM | POA: Diagnosis not present

## 2021-10-22 DIAGNOSIS — G20 Parkinson's disease: Secondary | ICD-10-CM | POA: Diagnosis not present

## 2021-10-22 DIAGNOSIS — I4891 Unspecified atrial fibrillation: Secondary | ICD-10-CM | POA: Diagnosis not present

## 2021-10-22 DIAGNOSIS — R531 Weakness: Secondary | ICD-10-CM | POA: Diagnosis not present

## 2021-10-24 DIAGNOSIS — I10 Essential (primary) hypertension: Secondary | ICD-10-CM | POA: Diagnosis not present

## 2021-10-24 DIAGNOSIS — E039 Hypothyroidism, unspecified: Secondary | ICD-10-CM | POA: Diagnosis not present

## 2021-10-24 DIAGNOSIS — I4891 Unspecified atrial fibrillation: Secondary | ICD-10-CM | POA: Diagnosis not present

## 2021-10-24 DIAGNOSIS — G20C Parkinsonism, unspecified: Secondary | ICD-10-CM | POA: Diagnosis not present

## 2021-10-24 DIAGNOSIS — E785 Hyperlipidemia, unspecified: Secondary | ICD-10-CM | POA: Diagnosis not present

## 2021-10-24 DIAGNOSIS — R531 Weakness: Secondary | ICD-10-CM | POA: Diagnosis not present

## 2021-10-24 DIAGNOSIS — N3946 Mixed incontinence: Secondary | ICD-10-CM | POA: Diagnosis not present

## 2021-10-24 DIAGNOSIS — Z950 Presence of cardiac pacemaker: Secondary | ICD-10-CM | POA: Diagnosis not present

## 2021-10-24 DIAGNOSIS — L89151 Pressure ulcer of sacral region, stage 1: Secondary | ICD-10-CM | POA: Diagnosis not present

## 2021-10-24 DIAGNOSIS — Z9181 History of falling: Secondary | ICD-10-CM | POA: Diagnosis not present

## 2021-10-25 DIAGNOSIS — I4891 Unspecified atrial fibrillation: Secondary | ICD-10-CM | POA: Diagnosis not present

## 2021-10-25 DIAGNOSIS — Z950 Presence of cardiac pacemaker: Secondary | ICD-10-CM | POA: Diagnosis not present

## 2021-10-25 DIAGNOSIS — G20C Parkinsonism, unspecified: Secondary | ICD-10-CM | POA: Diagnosis not present

## 2021-10-25 DIAGNOSIS — E785 Hyperlipidemia, unspecified: Secondary | ICD-10-CM | POA: Diagnosis not present

## 2021-10-25 DIAGNOSIS — Z9181 History of falling: Secondary | ICD-10-CM | POA: Diagnosis not present

## 2021-10-25 DIAGNOSIS — R531 Weakness: Secondary | ICD-10-CM | POA: Diagnosis not present

## 2021-10-25 NOTE — Progress Notes (Signed)
Remote pacemaker transmission.   

## 2021-10-27 DIAGNOSIS — Z950 Presence of cardiac pacemaker: Secondary | ICD-10-CM | POA: Diagnosis not present

## 2021-10-27 DIAGNOSIS — R531 Weakness: Secondary | ICD-10-CM | POA: Diagnosis not present

## 2021-10-27 DIAGNOSIS — Z9181 History of falling: Secondary | ICD-10-CM | POA: Diagnosis not present

## 2021-10-27 DIAGNOSIS — I4891 Unspecified atrial fibrillation: Secondary | ICD-10-CM | POA: Diagnosis not present

## 2021-10-27 DIAGNOSIS — G20C Parkinsonism, unspecified: Secondary | ICD-10-CM | POA: Diagnosis not present

## 2021-10-27 DIAGNOSIS — E785 Hyperlipidemia, unspecified: Secondary | ICD-10-CM | POA: Diagnosis not present

## 2021-10-29 DIAGNOSIS — G20C Parkinsonism, unspecified: Secondary | ICD-10-CM | POA: Diagnosis not present

## 2021-10-29 DIAGNOSIS — R531 Weakness: Secondary | ICD-10-CM | POA: Diagnosis not present

## 2021-10-29 DIAGNOSIS — Z9181 History of falling: Secondary | ICD-10-CM | POA: Diagnosis not present

## 2021-10-29 DIAGNOSIS — E785 Hyperlipidemia, unspecified: Secondary | ICD-10-CM | POA: Diagnosis not present

## 2021-10-29 DIAGNOSIS — Z950 Presence of cardiac pacemaker: Secondary | ICD-10-CM | POA: Diagnosis not present

## 2021-10-29 DIAGNOSIS — I4891 Unspecified atrial fibrillation: Secondary | ICD-10-CM | POA: Diagnosis not present

## 2021-11-01 DIAGNOSIS — R531 Weakness: Secondary | ICD-10-CM | POA: Diagnosis not present

## 2021-11-01 DIAGNOSIS — Z950 Presence of cardiac pacemaker: Secondary | ICD-10-CM | POA: Diagnosis not present

## 2021-11-01 DIAGNOSIS — E785 Hyperlipidemia, unspecified: Secondary | ICD-10-CM | POA: Diagnosis not present

## 2021-11-01 DIAGNOSIS — Z9181 History of falling: Secondary | ICD-10-CM | POA: Diagnosis not present

## 2021-11-01 DIAGNOSIS — G20C Parkinsonism, unspecified: Secondary | ICD-10-CM | POA: Diagnosis not present

## 2021-11-01 DIAGNOSIS — I4891 Unspecified atrial fibrillation: Secondary | ICD-10-CM | POA: Diagnosis not present

## 2021-11-03 DIAGNOSIS — Z950 Presence of cardiac pacemaker: Secondary | ICD-10-CM | POA: Diagnosis not present

## 2021-11-03 DIAGNOSIS — Z9181 History of falling: Secondary | ICD-10-CM | POA: Diagnosis not present

## 2021-11-03 DIAGNOSIS — R531 Weakness: Secondary | ICD-10-CM | POA: Diagnosis not present

## 2021-11-03 DIAGNOSIS — E785 Hyperlipidemia, unspecified: Secondary | ICD-10-CM | POA: Diagnosis not present

## 2021-11-03 DIAGNOSIS — I4891 Unspecified atrial fibrillation: Secondary | ICD-10-CM | POA: Diagnosis not present

## 2021-11-03 DIAGNOSIS — G20C Parkinsonism, unspecified: Secondary | ICD-10-CM | POA: Diagnosis not present

## 2021-11-04 ENCOUNTER — Encounter: Payer: Self-pay | Admitting: *Deleted

## 2021-11-05 DIAGNOSIS — Z9181 History of falling: Secondary | ICD-10-CM | POA: Diagnosis not present

## 2021-11-05 DIAGNOSIS — E785 Hyperlipidemia, unspecified: Secondary | ICD-10-CM | POA: Diagnosis not present

## 2021-11-05 DIAGNOSIS — Z950 Presence of cardiac pacemaker: Secondary | ICD-10-CM | POA: Diagnosis not present

## 2021-11-05 DIAGNOSIS — I4891 Unspecified atrial fibrillation: Secondary | ICD-10-CM | POA: Diagnosis not present

## 2021-11-05 DIAGNOSIS — G20C Parkinsonism, unspecified: Secondary | ICD-10-CM | POA: Diagnosis not present

## 2021-11-05 DIAGNOSIS — R531 Weakness: Secondary | ICD-10-CM | POA: Diagnosis not present

## 2021-11-08 DIAGNOSIS — Z9181 History of falling: Secondary | ICD-10-CM | POA: Diagnosis not present

## 2021-11-08 DIAGNOSIS — I4891 Unspecified atrial fibrillation: Secondary | ICD-10-CM | POA: Diagnosis not present

## 2021-11-08 DIAGNOSIS — G20C Parkinsonism, unspecified: Secondary | ICD-10-CM | POA: Diagnosis not present

## 2021-11-08 DIAGNOSIS — Z950 Presence of cardiac pacemaker: Secondary | ICD-10-CM | POA: Diagnosis not present

## 2021-11-08 DIAGNOSIS — E785 Hyperlipidemia, unspecified: Secondary | ICD-10-CM | POA: Diagnosis not present

## 2021-11-08 DIAGNOSIS — R531 Weakness: Secondary | ICD-10-CM | POA: Diagnosis not present

## 2021-11-10 DIAGNOSIS — I4891 Unspecified atrial fibrillation: Secondary | ICD-10-CM | POA: Diagnosis not present

## 2021-11-10 DIAGNOSIS — G20C Parkinsonism, unspecified: Secondary | ICD-10-CM | POA: Diagnosis not present

## 2021-11-10 DIAGNOSIS — Z9181 History of falling: Secondary | ICD-10-CM | POA: Diagnosis not present

## 2021-11-10 DIAGNOSIS — R531 Weakness: Secondary | ICD-10-CM | POA: Diagnosis not present

## 2021-11-10 DIAGNOSIS — E785 Hyperlipidemia, unspecified: Secondary | ICD-10-CM | POA: Diagnosis not present

## 2021-11-10 DIAGNOSIS — Z950 Presence of cardiac pacemaker: Secondary | ICD-10-CM | POA: Diagnosis not present

## 2021-11-12 DIAGNOSIS — Z9181 History of falling: Secondary | ICD-10-CM | POA: Diagnosis not present

## 2021-11-12 DIAGNOSIS — E785 Hyperlipidemia, unspecified: Secondary | ICD-10-CM | POA: Diagnosis not present

## 2021-11-12 DIAGNOSIS — I4891 Unspecified atrial fibrillation: Secondary | ICD-10-CM | POA: Diagnosis not present

## 2021-11-12 DIAGNOSIS — G20C Parkinsonism, unspecified: Secondary | ICD-10-CM | POA: Diagnosis not present

## 2021-11-12 DIAGNOSIS — R531 Weakness: Secondary | ICD-10-CM | POA: Diagnosis not present

## 2021-11-12 DIAGNOSIS — Z950 Presence of cardiac pacemaker: Secondary | ICD-10-CM | POA: Diagnosis not present

## 2021-11-15 ENCOUNTER — Telehealth: Payer: Self-pay | Admitting: Cardiovascular Disease

## 2021-11-15 DIAGNOSIS — Z950 Presence of cardiac pacemaker: Secondary | ICD-10-CM | POA: Diagnosis not present

## 2021-11-15 DIAGNOSIS — G20C Parkinsonism, unspecified: Secondary | ICD-10-CM | POA: Diagnosis not present

## 2021-11-15 DIAGNOSIS — Z9181 History of falling: Secondary | ICD-10-CM | POA: Diagnosis not present

## 2021-11-15 DIAGNOSIS — E785 Hyperlipidemia, unspecified: Secondary | ICD-10-CM | POA: Diagnosis not present

## 2021-11-15 DIAGNOSIS — R531 Weakness: Secondary | ICD-10-CM | POA: Diagnosis not present

## 2021-11-15 DIAGNOSIS — I4891 Unspecified atrial fibrillation: Secondary | ICD-10-CM | POA: Diagnosis not present

## 2021-11-15 NOTE — Telephone Encounter (Signed)
Pt spouse calling about letter sent with device check results

## 2021-11-15 NOTE — Telephone Encounter (Signed)
Spoke to the patient's husband. The patient is currently in hospice care. He stated he called in February to let Dr. Sallyanne Kuster know. Unfortunately the message was not sent to Dr. Sallyanne Kuster.

## 2021-11-17 DIAGNOSIS — R531 Weakness: Secondary | ICD-10-CM | POA: Diagnosis not present

## 2021-11-17 DIAGNOSIS — Z950 Presence of cardiac pacemaker: Secondary | ICD-10-CM | POA: Diagnosis not present

## 2021-11-17 DIAGNOSIS — E785 Hyperlipidemia, unspecified: Secondary | ICD-10-CM | POA: Diagnosis not present

## 2021-11-17 DIAGNOSIS — I4891 Unspecified atrial fibrillation: Secondary | ICD-10-CM | POA: Diagnosis not present

## 2021-11-17 DIAGNOSIS — G20C Parkinsonism, unspecified: Secondary | ICD-10-CM | POA: Diagnosis not present

## 2021-11-17 DIAGNOSIS — Z9181 History of falling: Secondary | ICD-10-CM | POA: Diagnosis not present

## 2021-11-19 DIAGNOSIS — G20C Parkinsonism, unspecified: Secondary | ICD-10-CM | POA: Diagnosis not present

## 2021-11-19 DIAGNOSIS — R531 Weakness: Secondary | ICD-10-CM | POA: Diagnosis not present

## 2021-11-19 DIAGNOSIS — Z950 Presence of cardiac pacemaker: Secondary | ICD-10-CM | POA: Diagnosis not present

## 2021-11-19 DIAGNOSIS — E785 Hyperlipidemia, unspecified: Secondary | ICD-10-CM | POA: Diagnosis not present

## 2021-11-19 DIAGNOSIS — I4891 Unspecified atrial fibrillation: Secondary | ICD-10-CM | POA: Diagnosis not present

## 2021-11-19 DIAGNOSIS — Z9181 History of falling: Secondary | ICD-10-CM | POA: Diagnosis not present

## 2021-11-22 DIAGNOSIS — R531 Weakness: Secondary | ICD-10-CM | POA: Diagnosis not present

## 2021-11-22 DIAGNOSIS — G20C Parkinsonism, unspecified: Secondary | ICD-10-CM | POA: Diagnosis not present

## 2021-11-22 DIAGNOSIS — Z9181 History of falling: Secondary | ICD-10-CM | POA: Diagnosis not present

## 2021-11-22 DIAGNOSIS — E785 Hyperlipidemia, unspecified: Secondary | ICD-10-CM | POA: Diagnosis not present

## 2021-11-22 DIAGNOSIS — Z950 Presence of cardiac pacemaker: Secondary | ICD-10-CM | POA: Diagnosis not present

## 2021-11-22 DIAGNOSIS — I4891 Unspecified atrial fibrillation: Secondary | ICD-10-CM | POA: Diagnosis not present

## 2021-11-24 DIAGNOSIS — G20C Parkinsonism, unspecified: Secondary | ICD-10-CM | POA: Diagnosis not present

## 2021-11-24 DIAGNOSIS — I10 Essential (primary) hypertension: Secondary | ICD-10-CM | POA: Diagnosis not present

## 2021-11-24 DIAGNOSIS — R531 Weakness: Secondary | ICD-10-CM | POA: Diagnosis not present

## 2021-11-24 DIAGNOSIS — N3946 Mixed incontinence: Secondary | ICD-10-CM | POA: Diagnosis not present

## 2021-11-24 DIAGNOSIS — Z950 Presence of cardiac pacemaker: Secondary | ICD-10-CM | POA: Diagnosis not present

## 2021-11-24 DIAGNOSIS — E785 Hyperlipidemia, unspecified: Secondary | ICD-10-CM | POA: Diagnosis not present

## 2021-11-24 DIAGNOSIS — L89151 Pressure ulcer of sacral region, stage 1: Secondary | ICD-10-CM | POA: Diagnosis not present

## 2021-11-24 DIAGNOSIS — Z9181 History of falling: Secondary | ICD-10-CM | POA: Diagnosis not present

## 2021-11-24 DIAGNOSIS — I4891 Unspecified atrial fibrillation: Secondary | ICD-10-CM | POA: Diagnosis not present

## 2021-11-24 DIAGNOSIS — E039 Hypothyroidism, unspecified: Secondary | ICD-10-CM | POA: Diagnosis not present

## 2021-11-25 DIAGNOSIS — R531 Weakness: Secondary | ICD-10-CM | POA: Diagnosis not present

## 2021-11-25 DIAGNOSIS — G20C Parkinsonism, unspecified: Secondary | ICD-10-CM | POA: Diagnosis not present

## 2021-11-25 DIAGNOSIS — E785 Hyperlipidemia, unspecified: Secondary | ICD-10-CM | POA: Diagnosis not present

## 2021-11-25 DIAGNOSIS — Z9181 History of falling: Secondary | ICD-10-CM | POA: Diagnosis not present

## 2021-11-25 DIAGNOSIS — Z950 Presence of cardiac pacemaker: Secondary | ICD-10-CM | POA: Diagnosis not present

## 2021-11-25 DIAGNOSIS — I4891 Unspecified atrial fibrillation: Secondary | ICD-10-CM | POA: Diagnosis not present

## 2021-12-24 DEATH — deceased
